# Patient Record
Sex: Male | Born: 1980 | Race: Black or African American | Hispanic: No | Marital: Single | Smoking: Former smoker
Health system: Southern US, Community
[De-identification: ages and names within clinical notes are randomized; demographics above are authoritative.]

## PROBLEM LIST (undated history)

## (undated) DIAGNOSIS — K219 Gastro-esophageal reflux disease without esophagitis: Secondary | ICD-10-CM

## (undated) DIAGNOSIS — D696 Thrombocytopenia, unspecified: Secondary | ICD-10-CM

## (undated) DIAGNOSIS — E46 Unspecified protein-calorie malnutrition: Secondary | ICD-10-CM

## (undated) DIAGNOSIS — Z923 Personal history of irradiation: Secondary | ICD-10-CM

## (undated) DIAGNOSIS — B20 Human immunodeficiency virus [HIV] disease: Secondary | ICD-10-CM

## (undated) DIAGNOSIS — Z8661 Personal history of infections of the central nervous system: Secondary | ICD-10-CM

## (undated) DIAGNOSIS — B451 Cerebral cryptococcosis: Secondary | ICD-10-CM

## (undated) DIAGNOSIS — Z8619 Personal history of other infectious and parasitic diseases: Secondary | ICD-10-CM

## (undated) DIAGNOSIS — F1010 Alcohol abuse, uncomplicated: Secondary | ICD-10-CM

## (undated) HISTORY — DX: Thrombocytopenia, unspecified: D69.6

## (undated) HISTORY — DX: Personal history of infections of the central nervous system: Z86.61

## (undated) HISTORY — DX: Unspecified protein-calorie malnutrition: E46

## (undated) HISTORY — DX: Personal history of other infectious and parasitic diseases: Z86.19

## (undated) HISTORY — DX: Cerebral cryptococcosis: B45.1

## (undated) HISTORY — DX: Human immunodeficiency virus (HIV) disease: B20

## (undated) HISTORY — DX: Alcohol abuse, uncomplicated: F10.10

## (undated) HISTORY — DX: Gastro-esophageal reflux disease without esophagitis: K21.9

---

## 2005-11-13 ENCOUNTER — Emergency Department (HOSPITAL_COMMUNITY): Admission: EM | Admit: 2005-11-13 | Discharge: 2005-11-13 | Payer: Self-pay | Admitting: Emergency Medicine

## 2006-12-28 ENCOUNTER — Emergency Department (HOSPITAL_COMMUNITY): Admission: EM | Admit: 2006-12-28 | Discharge: 2006-12-28 | Payer: Self-pay | Admitting: Emergency Medicine

## 2007-04-26 ENCOUNTER — Emergency Department (HOSPITAL_COMMUNITY): Admission: EM | Admit: 2007-04-26 | Discharge: 2007-04-26 | Payer: Self-pay | Admitting: Emergency Medicine

## 2007-08-08 ENCOUNTER — Emergency Department (HOSPITAL_COMMUNITY): Admission: EM | Admit: 2007-08-08 | Discharge: 2007-08-08 | Payer: Self-pay | Admitting: Emergency Medicine

## 2010-05-09 ENCOUNTER — Emergency Department (HOSPITAL_COMMUNITY)
Admission: EM | Admit: 2010-05-09 | Discharge: 2010-05-09 | Payer: Self-pay | Source: Home / Self Care | Admitting: Emergency Medicine

## 2011-01-19 LAB — I-STAT 8, (EC8 V) (CONVERTED LAB)
Acid-base deficit: 4 — ABNORMAL HIGH
BUN: 9
Bicarbonate: 19.9 — ABNORMAL LOW
Chloride: 107
Glucose, Bld: 116 — ABNORMAL HIGH
HCT: 49
Hemoglobin: 16.7
Operator id: 272551
Potassium: 3.2 — ABNORMAL LOW
Sodium: 142
TCO2: 21
pCO2, Ven: 33 — ABNORMAL LOW
pH, Ven: 7.388 — ABNORMAL HIGH

## 2011-01-19 LAB — CK: Total CK: 151

## 2011-01-19 LAB — POCT I-STAT CREATININE
Creatinine, Ser: 1.3
Operator id: 272551

## 2013-10-16 ENCOUNTER — Emergency Department (HOSPITAL_COMMUNITY)
Admission: EM | Admit: 2013-10-16 | Discharge: 2013-10-16 | Disposition: A | Discharge status: Home / Self Care | Payer: No Typology Code available for payment source | Attending: Emergency Medicine | Admitting: Emergency Medicine

## 2013-10-16 ENCOUNTER — Encounter (HOSPITAL_COMMUNITY): Payer: Self-pay | Admitting: Emergency Medicine

## 2013-10-16 DIAGNOSIS — IMO0002 Reserved for concepts with insufficient information to code with codable children: Secondary | ICD-10-CM | POA: Insufficient documentation

## 2013-10-16 DIAGNOSIS — Y92410 Unspecified street and highway as the place of occurrence of the external cause: Secondary | ICD-10-CM | POA: Insufficient documentation

## 2013-10-16 DIAGNOSIS — S161XXA Strain of muscle, fascia and tendon at neck level, initial encounter: Secondary | ICD-10-CM

## 2013-10-16 DIAGNOSIS — Y9389 Activity, other specified: Secondary | ICD-10-CM | POA: Insufficient documentation

## 2013-10-16 DIAGNOSIS — F17200 Nicotine dependence, unspecified, uncomplicated: Secondary | ICD-10-CM | POA: Insufficient documentation

## 2013-10-16 DIAGNOSIS — S139XXA Sprain of joints and ligaments of unspecified parts of neck, initial encounter: Secondary | ICD-10-CM | POA: Insufficient documentation

## 2013-10-16 DIAGNOSIS — T148XXA Other injury of unspecified body region, initial encounter: Secondary | ICD-10-CM

## 2013-10-16 MED ORDER — CYCLOBENZAPRINE HCL 10 MG PO TABS: 10.0000 mg | ORAL_TABLET | Freq: Two times a day (BID) | ORAL | Status: DC | PRN

## 2013-10-16 MED ORDER — IBUPROFEN 600 MG PO TABS: 600.0000 mg | ORAL_TABLET | Freq: Four times a day (QID) | ORAL | Status: DC | PRN

## 2013-10-16 MED ORDER — IBUPROFEN 800 MG PO TABS
800.0000 mg | ORAL_TABLET | Freq: Once | ORAL | Status: AC
  Administered 2013-10-16: 800 mg via ORAL
  Filled 2013-10-16: qty 1

## 2013-10-16 MED ORDER — TRAMADOL HCL 50 MG PO TABS
50.0000 mg | ORAL_TABLET | Freq: Once | ORAL | Status: AC
  Administered 2013-10-16: 50 mg via ORAL
  Filled 2013-10-16: qty 1
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
## 2016-12-20 ENCOUNTER — Emergency Department (HOSPITAL_COMMUNITY)
Admission: EM | Admit: 2016-12-20 | Discharge: 2016-12-20 | Disposition: A | Discharge status: Home / Self Care | Payer: Self-pay | Attending: Emergency Medicine | Admitting: Emergency Medicine

## 2016-12-20 ENCOUNTER — Encounter (HOSPITAL_COMMUNITY): Payer: Self-pay

## 2016-12-20 DIAGNOSIS — T1592XA Foreign body on external eye, part unspecified, left eye, initial encounter: Secondary | ICD-10-CM

## 2016-12-20 DIAGNOSIS — Y929 Unspecified place or not applicable: Secondary | ICD-10-CM | POA: Insufficient documentation

## 2016-12-20 DIAGNOSIS — Y33XXXA Other specified events, undetermined intent, initial encounter: Secondary | ICD-10-CM | POA: Insufficient documentation

## 2016-12-20 DIAGNOSIS — Y998 Other external cause status: Secondary | ICD-10-CM | POA: Insufficient documentation

## 2016-12-20 DIAGNOSIS — T1502XA Foreign body in cornea, left eye, initial encounter: Secondary | ICD-10-CM | POA: Insufficient documentation

## 2016-12-20 DIAGNOSIS — F17210 Nicotine dependence, cigarettes, uncomplicated: Secondary | ICD-10-CM | POA: Insufficient documentation

## 2016-12-20 DIAGNOSIS — Y939 Activity, unspecified: Secondary | ICD-10-CM | POA: Insufficient documentation

## 2016-12-20 MED ORDER — FLUORESCEIN-BENOXINATE 0.25-0.4 % OP SOLN: 1.0000 [drp] | Freq: Once | OPHTHALMIC | Status: DC

## 2016-12-20 MED ORDER — FLUORESCEIN SODIUM 0.6 MG OP STRP
ORAL_STRIP | OPHTHALMIC | Status: AC
  Filled 2016-12-20: qty 1

## 2016-12-20 MED ORDER — TETRACAINE HCL 0.5 % OP SOLN
2.0000 [drp] | Freq: Once | OPHTHALMIC | Status: AC
  Administered 2016-12-20: 2 [drp] via OPHTHALMIC
  Filled 2016-12-20: qty 4

## 2016-12-20 NOTE — ED Provider Notes (Signed)
[facility] Provider Note   CSN: [id number] Arrival date & time: 12/20/16  [id number]     History   Chief Complaint Chief Complaint  Patient presents with  . Eye Pain    HPI  Christopher Henson is a 36 y.o. tense to the ED complaining of foreign body sensation in the left eye which began last night at midnight after he was shaking out his clothes from work. Patient works at the [employer], and is concerned that he has a small piece of metal in his left eye. Patient describes it as irritation with watering, more so than pain. He reports he has tried to flush out his eye and used eyedrops but has had no relief and feels that something is still in his eye. Patient denies decreased visual acuity, discharge, or pain with eye movements. Patient does not wear contacts.      History reviewed. No pertinent past medical history.  There are no active problems to display for this patient.   History reviewed. No pertinent surgical history.     Home Medications    Prior to Admission medications   Medication Sig Start Date End Date Taking? Authorizing Provider  cyclobenzaprine (FLEXERIL) 10 MG tablet Take 1 tablet (10 mg total) by mouth 2 (two) times daily as needed for muscle spasms. 10/16/13   Brandt LoosenManly, Julie, MD  ibuprofen (ADVIL,MOTRIN) 600 MG tablet Take 1 tablet (600 mg total) by mouth every 6 (six) hours as needed. 10/16/13   Brandt LoosenManly, Julie, MD    Family History No family history on file.  Social History Social History  Substance Use Topics  . Smoking status: Current Every Day Smoker    Packs/day: 1.00    Types: Cigarettes  . Smokeless tobacco: Never Used  . Alcohol use No     Allergies   Patient has no known allergies.   Review of Systems Review of Systems  Constitutional: Negative for chills and fever.  Eyes: Positive for pain and redness. Negative for photophobia, discharge and visual disturbance.     Physical Exam Updated Vital Signs BP 121/83 (BP Location: Left  Arm)   Pulse 95   Temp 98.1 F (36.7 C) (Oral)   Resp 20   Ht 6' (1.829 m)   Wt 72.6 kg (160 lb)   SpO2 99%   BMI 21.70 kg/m   Physical Exam  Constitutional: He appears well-developed and well-nourished. No distress.  HENT:  Head: Normocephalic and atraumatic.  Eyes: Pupils are equal, round, and reactive to light. EOM are normal. Right eye exhibits no discharge. Left eye exhibits no discharge.  Visual acuity 20/20 Left eye, 20/20 right eye, 20/20 both Left sclera is erythematous and actively watering on exam, no purulent discharge, area of conjunctival irritation seen on inner upper lid PERRL, no consensual pain with pupillary constriction, EOMs intact and non-painful   On Slit Lamp Exam with fluorescein two small foreign bodies are visualized, with some surrounding scratch marks on the central cornea. One FB was removed with cotton tip, but second was embedded and unable to be removed. No evidence of microperforation of the globe  Pulmonary/Chest: Effort normal. No respiratory distress.  Neurological: He is alert. Coordination normal.  Skin: He is not diaphoretic.  Psychiatric: He has a normal mood and affect. His behavior is normal.  Nursing note and vitals reviewed.    ED Treatments / Results  Labs (all labs ordered are listed, but only abnormal results are displayed) Labs Reviewed - No data to display  EKG  EKG Interpretation None       Radiology No results found.  Procedures .Foreign Body Removal Date/Time: 12/20/2016 5:35 PM Performed by: Dartha Lodge Authorized by: Dartha Lodge  Consent: Verbal consent obtained. Consent given by: patient Body area: eye Anesthesia: local infiltration  Anesthesia: Local Anesthetic: tetracaine drops Patient cooperative: yes Localization method: slit lamp and visualized Removal mechanism: moist cotton swab Eye examined with fluorescein. Corneal abrasion size: small Corneal abrasion location: central No residual rust  ring present. Depth: superficial (2 foreign bodies present, one was superficial and able to be removed, a second was embedded) Complexity: simple 1 objects recovered. Objects recovered: small piece of metal Post-procedure assessment: foreign body removed (second foreign body retained) Patient tolerance: Patient tolerated the procedure well with no immediate complications   (including critical care time)  Medications Ordered in ED Medications  tetracaine (PONTOCAINE) 0.5 % ophthalmic solution 2 drop (2 drops Left Eye Given 12/20/16 1191)     Initial Impression / Assessment and Plan / ED Course  I have reviewed the triage vital signs and the nursing notes.  Pertinent labs & imaging results that were available during my care of the patient were reviewed by me and considered in my medical decision making (see chart for details).  Patient presents with foreign body in the left eye. No decreased visual acuity. No change in vision, acuity equal bilaterally.  Pt is not a contact lens wearer.  Exam non-concerning for orbital cellulitis, hyphema, corneal ulcers. 2 small metallic foreign bodies present on slit lamp exam, one was removed with cotton-tip applicator, the other was embedded in the cornea and unable to be removed. Opthalmology was contacted and Dr. Randon Goldsmith will see the patient in office today at 1300 for management of retained foreign body.  Final Clinical Impressions(s) / ED Diagnoses   Final diagnoses:  Foreign body of left eye, initial encounter    New Prescriptions Discharge Medication List as of 12/20/2016 10:22 AM       Dartha Lodge, PA-C 12/20/16 1800    Gwyneth Sprout, MD 12/21/16 2102

## 2016-12-20 NOTE — ED Notes (Signed)
Instructed to go to Dr. Randon GoldsmithLyles office at 1300 today.

## 2016-12-20 NOTE — ED Triage Notes (Signed)
Per Pt, Pt is coming from home with complaints of left eye discomfort. Pt reports that he noticed something in his left eye last night, but has not been able to remove it even wth flushing. Irritation and redness noted to the left eye.

## 2016-12-20 NOTE — ED Notes (Signed)
PA speaking with Opthalmology.

## 2017-08-13 ENCOUNTER — Encounter: Payer: Self-pay | Admitting: Internal Medicine

## 2017-10-03 ENCOUNTER — Ambulatory Visit: Payer: Self-pay | Admitting: Internal Medicine

## 2018-01-10 ENCOUNTER — Encounter (HOSPITAL_COMMUNITY): Payer: Self-pay | Admitting: Emergency Medicine

## 2018-01-10 ENCOUNTER — Ambulatory Visit (HOSPITAL_COMMUNITY)
Admission: EM | Admit: 2018-01-10 | Discharge: 2018-01-10 | Disposition: A | Discharge status: Home / Self Care | Payer: BLUE CROSS/BLUE SHIELD | Attending: Family Medicine | Admitting: Family Medicine

## 2018-01-10 DIAGNOSIS — L02415 Cutaneous abscess of right lower limb: Secondary | ICD-10-CM | POA: Diagnosis not present

## 2018-01-10 DIAGNOSIS — L0291 Cutaneous abscess, unspecified: Secondary | ICD-10-CM | POA: Diagnosis not present

## 2018-01-10 MED ORDER — SULFAMETHOXAZOLE-TRIMETHOPRIM 800-160 MG PO TABS: 1.0000 | ORAL_TABLET | Freq: Two times a day (BID) | ORAL | 0 refills | Status: AC

## 2018-01-10 NOTE — ED Triage Notes (Signed)
PT has a abscess to right inner thigh for 5-6 days. Has had one here before several years ago.

## 2018-01-10 NOTE — ED Provider Notes (Signed)
[place]    CSN: [id number] Arrival date & time: 01/10/18  0901     History   Chief Complaint Chief Complaint  Patient presents with  . Abscess    HPI Christopher Henson is a 37 y.o. male.   He is a 37 year old male that presents with abscess to right upper inner thigh that has worsened over the past week.  Denies drainage from the area.  Denies any fever, chills, body aches. He has been using some over-the-counter boil ease medication without relief of symptoms.    Abscess  Location:  Leg Leg abscess location:  R upper leg Abscess quality: fluctuance, induration, painful, redness and warmth   Abscess quality: not draining, no itching and not weeping   Red streaking: no   Duration:  1 week Progression:  Worsening Pain details:    Quality:  Dull, pressure and aching   Severity:  Moderate   Duration:  4 days   Timing:  Intermittent   Progression:  Waxing and waning Chronicity:  New Context: not diabetes, not immunosuppression, not injected drug use, not insect bite/sting and not skin injury   Relieved by:  Nothing Associated symptoms: no anorexia, no fatigue, no fever, no headaches, no nausea and no vomiting   Risk factors: no family hx of MRSA, no hx of MRSA and no prior abscess     Past Medical History:  Diagnosis Date  . Alcohol abuse   . GERD (gastroesophageal reflux disease)   . Thrombocytopenia ([place])     There are no active problems to display for this patient.   History reviewed. No pertinent surgical history.     Home Medications    Prior to Admission medications   Medication Sig Start Date End Date Taking? Authorizing Provider  cyclobenzaprine (FLEXERIL) 10 MG tablet Take 1 tablet (10 mg total) by mouth 2 (two) times daily as needed for muscle spasms. 10/16/13   Elyn Peers, MD  ibuprofen (ADVIL,MOTRIN) 600 MG tablet Take 1 tablet (600 mg total) by mouth every 6 (six) hours as needed. 10/16/13   Elyn Peers, MD  pantoprazole (PROTONIX) 40  MG tablet Take 40 mg by mouth daily.    [provider]  sulfamethoxazole-trimethoprim (BACTRIM DS,SEPTRA DS) 800-160 MG tablet Take 1 tablet by mouth 2 (two) times daily for 7 days. 01/10/18 01/17/18  Loura Halt A, NP  triamcinolone cream (KENALOG) 0.1 % Apply 1 application topically 2 (two) times daily.    [provider]    Family History No family history on file.  Social History Social History   Tobacco Use  . Smoking status: Current Every Day Smoker    Packs/day: 1.00    Types: Cigarettes  . Smokeless tobacco: Never Used  Substance Use Topics  . Alcohol use: No  . Drug use: Yes    Types: Marijuana     Allergies   Patient has no known allergies.   Review of Systems Review of Systems  Constitutional: Negative for activity change, appetite change, chills, fatigue, fever and unexpected weight change.  Gastrointestinal: Negative for anorexia, nausea and vomiting.  Allergic/Immunologic: Negative for immunocompromised state.  Neurological: Negative for headaches.  Hematological: Does not bruise/bleed easily.     Physical Exam Triage Vital Signs ED Triage Vitals  Enc Vitals Group     BP 01/10/18 0915 116/71     Pulse Rate 01/10/18 0915 100     Resp 01/10/18 0915 16     Temp 01/10/18 0915 98.8 F (37.1 C)  Temp Source 01/10/18 0915 Oral     SpO2 01/10/18 0915 100 %     Weight --      Height --      Head Circumference --      Peak Flow --      Pain Score 01/10/18 0924 7     Pain Loc --      Pain Edu? --      Excl. in [place]? --    No data found.  Updated Vital Signs BP 116/71 (BP Location: Left Arm)   Pulse 100   Temp 98.8 F (37.1 C) (Oral)   Resp 16   SpO2 100%   Visual Acuity Right Eye Distance:   Left Eye Distance:   Bilateral Distance:    Right Eye Near:   Left Eye Near:    Bilateral Near:     Physical Exam  Skin: Skin is warm and dry. Capillary refill takes less than 2 seconds.     5 cm by 5 cm abscess with approx 2 cm  fluctuance and surrounding induration and erythema. Tender to touch.   Nursing note and vitals reviewed.    UC Treatments / Results  Labs (all labs ordered are listed, but only abnormal results are displayed) Labs Reviewed - No data to display  EKG None  Radiology No results found.  Procedures Incision and Drainage Date/Time: 01/10/2018 10:29 AM Performed by: Orvan July, NP Authorized by: Orvan July, NP   Consent:    Consent obtained:  Verbal   Consent given by:  Patient   Risks discussed:  Bleeding   Alternatives discussed:  No treatment Location:    Type:  Abscess   Size:  5 cm by 5 cm   Location:  Lower extremity   Lower extremity location:  Leg   Leg location:  R upper leg Pre-procedure details:    Skin preparation:  Betadine Anesthesia (see MAR for exact dosages):    Anesthesia method:  Local infiltration   Local anesthetic:  Lidocaine 2% w/o epi Procedure type:    Complexity:  Complex Procedure details:    Needle aspiration: no     Incision types:  Single straight   Incision depth:  Subcutaneous   Scalpel blade:  11   Wound management:  Probed and deloculated   Drainage:  Bloody and purulent   Drainage amount:  Copious   Packing materials:  1/4 in gauze Post-procedure details:    Patient tolerance of procedure:  Tolerated well, no immediate complications   (including critical care time)  Medications Ordered in UC Medications - No data to display  Initial Impression / Assessment and Plan / UC Course  I have reviewed the triage vital signs and the nursing notes.  Pertinent labs & imaging results that were available during my care of the patient were reviewed by me and considered in my medical decision making (see chart for details).     Abscess I&D. A lot or bloody purulent drainage from abscess Pt felt relief Packed and drained Will return in 2 days for recheck Bactrim for abx coverage   Final Clinical Impressions(s) / UC Diagnoses    Final diagnoses:  Abscess     Discharge Instructions     It was nice meeting you!!  We drained the abscess Bactrim for antibiotic coverage Keep area clean and dry, changing dressings.  Come back in 2 days for wound check and to remove the packing Sooner if worse.  You can take ibuprofen  for pain as needed    ED Prescriptions    Medication Sig Dispense Auth. Provider   sulfamethoxazole-trimethoprim (BACTRIM DS,SEPTRA DS) 800-160 MG tablet Take 1 tablet by mouth 2 (two) times daily for 7 days. 14 tablet Orvan July, NP     Controlled Substance Prescriptions [place] Controlled Substance Registry consulted? no   Orvan July, NP 01/10/18 1033

## 2018-01-10 NOTE — Discharge Instructions (Addendum)
It was nice meeting you!!  We drained the abscess Bactrim for antibiotic coverage Keep area clean and dry, changing dressings.  Come back in 2 days for wound check and to remove the packing Sooner if worse.  You can take ibuprofen for pain as needed

## 2018-01-12 ENCOUNTER — Encounter (HOSPITAL_COMMUNITY): Payer: Self-pay

## 2018-01-12 ENCOUNTER — Ambulatory Visit (HOSPITAL_COMMUNITY)
Admission: EM | Admit: 2018-01-12 | Discharge: 2018-01-12 | Disposition: A | Discharge status: Home / Self Care | Payer: BLUE CROSS/BLUE SHIELD | Attending: Family Medicine | Admitting: Family Medicine

## 2018-01-12 DIAGNOSIS — Z5189 Encounter for other specified aftercare: Secondary | ICD-10-CM

## 2018-01-12 DIAGNOSIS — L02415 Cutaneous abscess of right lower limb: Secondary | ICD-10-CM

## 2018-01-12 NOTE — ED Triage Notes (Signed)
Pt presents to have wound check after having an abscess lanced on his right inner thigh.

## 2018-01-12 NOTE — Discharge Instructions (Signed)
Cont abx

## 2018-01-12 NOTE — ED Provider Notes (Signed)
  [facility]    CSN: [id number] Arrival date & time: 01/12/18  1358  Chief Complaint  Patient presents with  . Wound Check   Patient was here 2 days ago for a right inner thigh abscess that was incised and drained.  Packing was placed.  He was also placed on antibiotics.  He feels much better overall.  He has not remove the dressing since his appointment.  BP 99/72 (BP Location: Right Arm)   Pulse 95   Temp 98.2 F (36.8 C) (Oral)   Resp 20   SpO2 95%  Gen- awake, alert Skin-on the proximal and medial portion of the right thigh, there is an indurated area with a central opening, packing removed without foul odor, no erythema or excessive tenderness to palpation Psych-age appropriate judgment and insight  Final Clinical Impressions(s) / UC Diagnoses   Final diagnoses:  Wound check, abscess   Doing well.  Finish antibiotic.  Follow-up as needed.  Patient voiced understanding and agreement to the plan.   Discharge Instructions     Cont abx.    ED Prescriptions    None     Controlled Substance Prescriptions [place] Controlled Substance Registry consulted? Not Applicable   Sharlene Dory, [place] 01/12/18 1436

## 2018-05-11 DIAGNOSIS — B451 Cerebral cryptococcosis: Secondary | ICD-10-CM

## 2018-05-11 HISTORY — DX: Cerebral cryptococcosis: B45.1

## 2018-06-03 ENCOUNTER — Encounter (HOSPITAL_COMMUNITY): Payer: Self-pay | Admitting: Emergency Medicine

## 2018-06-03 ENCOUNTER — Ambulatory Visit (HOSPITAL_COMMUNITY)
Admission: EM | Admit: 2018-06-03 | Discharge: 2018-06-03 | Disposition: A | Discharge status: Home / Self Care | Payer: BLUE CROSS/BLUE SHIELD

## 2018-06-03 ENCOUNTER — Other Ambulatory Visit: Payer: Self-pay

## 2018-06-03 DIAGNOSIS — R51 Headache: Secondary | ICD-10-CM | POA: Diagnosis not present

## 2018-06-03 DIAGNOSIS — R519 Headache, unspecified: Secondary | ICD-10-CM

## 2018-06-03 MED ORDER — METOCLOPRAMIDE HCL 5 MG/ML IJ SOLN
5.0000 mg | Freq: Once | INTRAMUSCULAR | Status: AC
  Administered 2018-06-03: 5 mg via INTRAMUSCULAR

## 2018-06-03 MED ORDER — KETOROLAC TROMETHAMINE 30 MG/ML IJ SOLN
30.0000 mg | Freq: Once | INTRAMUSCULAR | Status: AC
  Administered 2018-06-03: 30 mg via INTRAMUSCULAR

## 2018-06-03 MED ORDER — DEXAMETHASONE SODIUM PHOSPHATE 10 MG/ML IJ SOLN
10.0000 mg | Freq: Once | INTRAMUSCULAR | Status: AC
Start: 2018-06-03 — End: 2018-06-03
  Administered 2018-06-03: 10 mg via INTRAMUSCULAR

## 2018-06-03 MED ORDER — KETOROLAC TROMETHAMINE 30 MG/ML IJ SOLN
INTRAMUSCULAR | Status: AC
  Filled 2018-06-03: qty 1

## 2018-06-03 MED ORDER — DEXAMETHASONE SODIUM PHOSPHATE 10 MG/ML IJ SOLN
INTRAMUSCULAR | Status: AC
  Filled 2018-06-03: qty 1

## 2018-06-03 MED ORDER — METOCLOPRAMIDE HCL 5 MG/ML IJ SOLN
INTRAMUSCULAR | Status: AC
  Filled 2018-06-03: qty 2

## 2018-06-03 NOTE — Discharge Instructions (Signed)
Toradol, reglan, decadron injection in office today. Keep hydrated, urine should be clear to pale yellow in color. Please monitor for worsening symptoms, worsening headache, blurry vision, nausea/vomiting, dizziness, weakness, passing out, go to the emergency department for further evaluation needed.

## 2018-06-03 NOTE — ED Triage Notes (Signed)
Headache since Thursday, slight cough.  Denies vision changes

## 2018-06-03 NOTE — ED Provider Notes (Signed)
[place]    CSN: [id number] Arrival date & time: 06/03/18  0803     History   Chief Complaint Chief Complaint  Patient presents with  . Headache    HPI Christopher Henson is a 38 y.o. male.   38 year old male comes in for 4-5 day history of right sided headache. States it is to the back of his head, constant, waxes and wanes in intensity. When given examples of characteristics of pain, such as throbbing, stabbing, aching, patient states "all of the above". He states movement makes the pain worse. Denies nausea, vomiting. Denies photophobia, phonophobia. Denies weakness, dizziness, syncope. Denies blurry vision. Denies head injury/loss of consciousness. Also started having cough, nasal congestion. Denies fever, chills, night sweats. Tylenol '1000mg'$  without relief. Current every day smoker.      Past Medical History:  Diagnosis Date  . Alcohol abuse   . GERD (gastroesophageal reflux disease)   . Thrombocytopenia ([place])     There are no active problems to display for this patient.   History reviewed. No pertinent surgical history.     Home Medications    Prior to Admission medications   Medication Sig Start Date End Date Taking? Authorizing Provider  Acetaminophen (TYLENOL EXTRA STRENGTH PO) Take by mouth.   Yes [provider]    Family History Family History  Problem Relation Age of Onset  . Diabetes Mother   . Hypertension Mother   . Diabetes Father   . Hypertension Father     Social History Social History   Tobacco Use  . Smoking status: Current Every Day Smoker    Packs/day: 1.00    Types: Cigarettes  . Smokeless tobacco: Never Used  Substance Use Topics  . Alcohol use: Yes  . Drug use: Not Currently    Types: Marijuana     Allergies   Patient has no known allergies.   Review of Systems Review of Systems  Reason unable to perform ROS: See HPI as above.     Physical Exam Triage Vital Signs ED Triage Vitals  Enc Vitals  Group     BP 06/03/18 0835 116/77     Pulse Rate 06/03/18 0835 (!) 102     Resp 06/03/18 0835 20     Temp 06/03/18 0835 100 F (37.8 C)     Temp Source 06/03/18 0835 Oral     SpO2 06/03/18 0835 99 %     Weight --      Height --      Head Circumference --      Peak Flow --      Pain Score 06/03/18 0832 10     Pain Loc --      Pain Edu? --      Excl. in [place]? --    No data found.  Updated Vital Signs BP 116/77 (BP Location: Left Arm)   Pulse (!) 102   Temp 100 F (37.8 C) (Oral)   Resp 20   SpO2 99%   Physical Exam Constitutional:      General: He is not in acute distress.    Appearance: He is well-developed. He is not ill-appearing, toxic-appearing or diaphoretic.  HENT:     Head: Normocephalic and atraumatic.     Right Ear: Tympanic membrane, ear canal and external ear normal. Tympanic membrane is not erythematous or bulging.     Left Ear: Tympanic membrane, ear canal and external ear normal. Tympanic membrane is not erythematous or bulging.  Nose: Nose normal.     Right Sinus: No maxillary sinus tenderness or frontal sinus tenderness.     Left Sinus: No maxillary sinus tenderness or frontal sinus tenderness.     Mouth/Throat:     Mouth: Mucous membranes are moist.     Pharynx: Oropharynx is clear. Uvula midline.  Eyes:     Extraocular Movements: Extraocular movements intact.     Conjunctiva/sclera: Conjunctivae normal.     Pupils: Pupils are equal, round, and reactive to light.  Neck:     Musculoskeletal: Normal range of motion and neck supple. No neck rigidity.  Cardiovascular:     Rate and Rhythm: Normal rate and regular rhythm.     Heart sounds: Normal heart sounds. No murmur. No friction rub. No gallop.   Pulmonary:     Effort: Pulmonary effort is normal. No respiratory distress.     Breath sounds: Normal breath sounds. No stridor. No decreased breath sounds, wheezing, rhonchi or rales.  Skin:    General: Skin is warm and dry.  Neurological:     General:  No focal deficit present.     Mental Status: He is alert and oriented to person, place, and time.     GCS: GCS eye subscore is 4. GCS verbal subscore is 5. GCS motor subscore is 6.     Cranial Nerves: Cranial nerves are intact.     Sensory: Sensation is intact.     Motor: Motor function is intact.     Coordination: Coordination is intact.     Gait: Gait is intact.  Psychiatric:        Behavior: Behavior normal.        Judgment: Judgment normal.    UC Treatments / Results  Labs (all labs ordered are listed, but only abnormal results are displayed) Labs Reviewed - No data to display  EKG None  Radiology No results found.  Procedures Procedures (including critical care time)  Medications Ordered in UC Medications  ketorolac (TORADOL) 30 MG/ML injection 30 mg (30 mg Intramuscular Given 06/03/18 0928)  metoCLOPramide (REGLAN) injection 5 mg (5 mg Intramuscular Given 06/03/18 0931)  dexamethasone (DECADRON) injection 10 mg (10 mg Intramuscular Given 06/03/18 0932)    Initial Impression / Assessment and Plan / UC Course  I have reviewed the triage vital signs and the nursing notes.  Pertinent labs & imaging results that were available during my care of the patient were reviewed by me and considered in my medical decision making (see chart for details).    No alarming signs on exam. Will provide decadon, toradol, reglan injection for symptoms. Other symptomatic treatment discussed. Push fluids. Return precautions given.  Final Clinical Impressions(s) / UC Diagnoses   Final diagnoses:  Acute intractable headache, unspecified headache type    ED Prescriptions    None        Ok Edwards, PA-C 06/03/18 [id number]

## 2018-06-05 ENCOUNTER — Emergency Department (HOSPITAL_COMMUNITY): Payer: BLUE CROSS/BLUE SHIELD

## 2018-06-05 ENCOUNTER — Encounter (HOSPITAL_COMMUNITY): Payer: Self-pay | Admitting: Emergency Medicine

## 2018-06-05 ENCOUNTER — Emergency Department (HOSPITAL_COMMUNITY)
Admission: EM | Admit: 2018-06-05 | Discharge: 2018-06-05 | Disposition: A | Discharge status: Home / Self Care | Payer: BLUE CROSS/BLUE SHIELD | Attending: Emergency Medicine | Admitting: Emergency Medicine

## 2018-06-05 ENCOUNTER — Other Ambulatory Visit: Payer: Self-pay

## 2018-06-05 DIAGNOSIS — B370 Candidal stomatitis: Secondary | ICD-10-CM | POA: Diagnosis not present

## 2018-06-05 DIAGNOSIS — F17210 Nicotine dependence, cigarettes, uncomplicated: Secondary | ICD-10-CM | POA: Insufficient documentation

## 2018-06-05 DIAGNOSIS — G4489 Other headache syndrome: Secondary | ICD-10-CM | POA: Insufficient documentation

## 2018-06-05 DIAGNOSIS — R51 Headache: Secondary | ICD-10-CM | POA: Diagnosis present

## 2018-06-05 LAB — CBC WITH DIFFERENTIAL/PLATELET
Abs Immature Granulocytes: 0 10*3/uL (ref 0.00–0.07)
Basophils Absolute: 0 10*3/uL (ref 0.0–0.1)
Basophils Relative: 1 %
Eosinophils Absolute: 0 10*3/uL (ref 0.0–0.5)
Eosinophils Relative: 0 %
HCT: 37.3 % — ABNORMAL LOW (ref 39.0–52.0)
Hemoglobin: 12.3 g/dL — ABNORMAL LOW (ref 13.0–17.0)
Lymphocytes Relative: 6 %
Lymphs Abs: 0.3 10*3/uL — ABNORMAL LOW (ref 0.7–4.0)
MCH: 27.6 pg (ref 26.0–34.0)
MCHC: 33 g/dL (ref 30.0–36.0)
MCV: 83.8 fL (ref 80.0–100.0)
Monocytes Absolute: 0.2 10*3/uL (ref 0.1–1.0)
Monocytes Relative: 5 %
Neutro Abs: 4 10*3/uL (ref 1.7–7.7)
Neutrophils Relative %: 88 %
Platelets: 147 10*3/uL — ABNORMAL LOW (ref 150–400)
RBC: 4.45 MIL/uL (ref 4.22–5.81)
RDW: 14.4 % (ref 11.5–15.5)
WBC: 4.5 10*3/uL (ref 4.0–10.5)
nRBC: 0 % (ref 0.0–0.2)
nRBC: 2 /100 WBC — ABNORMAL HIGH

## 2018-06-05 LAB — COMPREHENSIVE METABOLIC PANEL
ALT: 77 U/L — ABNORMAL HIGH (ref 0–44)
AST: 55 U/L — ABNORMAL HIGH (ref 15–41)
Albumin: 3.5 g/dL (ref 3.5–5.0)
Alkaline Phosphatase: 98 U/L (ref 38–126)
Anion gap: 12 (ref 5–15)
BUN: 13 mg/dL (ref 6–20)
CO2: 27 mmol/L (ref 22–32)
Calcium: 8.7 mg/dL — ABNORMAL LOW (ref 8.9–10.3)
Chloride: 101 mmol/L (ref 98–111)
Creatinine, Ser: 0.8 mg/dL (ref 0.61–1.24)
GFR calc Af Amer: >60 mL/min (ref >60)
GFR calc non Af Amer: >60 mL/min (ref >60)
Glucose, Bld: 111 mg/dL — ABNORMAL HIGH (ref 70–99)
Potassium: 3.5 mmol/L (ref 3.5–5.1)
Sodium: 140 mmol/L (ref 135–145)
Total Bilirubin: 0.9 mg/dL (ref 0.3–1.2)
Total Protein: 6.8 g/dL (ref 6.5–8.1)

## 2018-06-05 MED ORDER — METOCLOPRAMIDE HCL 5 MG/ML IJ SOLN
10.0000 mg | Freq: Once | INTRAMUSCULAR | Status: AC
  Administered 2018-06-05: 10 mg via INTRAVENOUS
  Filled 2018-06-05: qty 2

## 2018-06-05 MED ORDER — DIPHENHYDRAMINE HCL 50 MG/ML IJ SOLN
25.0000 mg | Freq: Once | INTRAMUSCULAR | Status: AC
  Administered 2018-06-05: 25 mg via INTRAVENOUS
  Filled 2018-06-05: qty 1

## 2018-06-05 MED ORDER — NYSTATIN 100000 UNIT/ML MT SUSP
5.0000 mL | Freq: Four times a day (QID) | OROMUCOSAL | 0 refills | Status: DC
Start: 2018-06-05 — End: 2019-01-29

## 2018-06-05 MED ORDER — ONDANSETRON HCL 4 MG/2ML IJ SOLN
4.0000 mg | Freq: Once | INTRAMUSCULAR | Status: AC
Start: 2018-06-05 — End: 2018-06-05
  Administered 2018-06-05: 4 mg via INTRAVENOUS
  Filled 2018-06-05: qty 2

## 2018-06-05 MED ORDER — METOCLOPRAMIDE HCL 10 MG PO TABS: 10.0000 mg | ORAL_TABLET | Freq: Three times a day (TID) | ORAL | 0 refills | Status: DC | PRN

## 2018-06-05 MED ORDER — SODIUM CHLORIDE 0.9 % IV BOLUS (SEPSIS)
1000.0000 mL | Freq: Once | INTRAVENOUS | Status: AC
  Administered 2018-06-05: 1000 mL via INTRAVENOUS

## 2018-06-05 MED ORDER — FENTANYL CITRATE (PF) 100 MCG/2ML IJ SOLN
100.0000 ug | Freq: Once | INTRAMUSCULAR | Status: AC
  Administered 2018-06-05: 100 ug via INTRAVENOUS
  Filled 2018-06-05: qty 2

## 2018-06-05 NOTE — Discharge Instructions (Addendum)
Please be sure to follow-up on all your lab testing and have a close follow-up as an outpatient.  RETURN IMMEDIATELY IF you develop a sudden, severe headache or confusion, become poorly responsive or faint, develop a fever above 100.19F or problem breathing, have a change in speech, vision, swallowing, or understanding, or develop new weakness, numbness, tingling, incoordination, or have a seizure.

## 2018-06-05 NOTE — ED Notes (Signed)
Pt verbalized understanding of discharge paperwork, prescriptions and follow-up care. Pt stated he would call phone numbers provided to set up PCP.

## 2018-06-05 NOTE — ED Triage Notes (Signed)
Pt reports a headache for 4 days. Pt reports going to UC and getting treated however the headache has not gone away.

## 2018-06-05 NOTE — ED Provider Notes (Signed)
[facility] EMERGENCY DEPARTMENT Provider Note   CSN: [id number] Arrival date & time: 06/05/18  [id number]    History   Chief Complaint Chief Complaint  Patient presents with  . Headache    HPI Brandonmichael Ghent is a 38 y.o. male.     The history is provided by the patient.  Headache  Pain location:  Occipital Quality:  Dull Onset quality:  Gradual Duration:  6 days Timing:  Constant Progression:  Worsening Chronicity:  New Relieved by:  Nothing Worsened by:  Nothing Associated symptoms: fatigue and sore throat   Associated symptoms: no abdominal pain, no fever, no focal weakness, no syncope, no visual change, no vomiting and no weakness   Patient reports for the past 6 days has had gradually worsening occipital headache. No focal weakness.  He was seen in urgent care, given medications but no improvement. He does not typically get headaches. Denies any head trauma. He reports drinking about a pint of liquor a week. He also reports he has had sore throat and "white stuff "in the back of his throat for the past several weeks.  He has never had this before He also endorses night sweats and weight loss recently.   PMH-none Soc hx - denies IVDA, reports ETOH use Past Medical History:  Diagnosis Date  . Alcohol abuse   . GERD (gastroesophageal reflux disease)   . Thrombocytopenia ([place])     There are no active problems to display for this patient.   History reviewed. No pertinent surgical history.      Home Medications    Prior to Admission medications   Medication Sig Start Date End Date Taking? Authorizing Provider  Acetaminophen (TYLENOL EXTRA STRENGTH PO) Take by mouth.    [provider]    Family History Family History  Problem Relation Age of Onset  . Diabetes Mother   . Hypertension Mother   . Diabetes Father   . Hypertension Father     Social History Social History   Tobacco Use  . Smoking status: Current Every Day  Smoker    Packs/day: 1.00    Types: Cigarettes  . Smokeless tobacco: Never Used  Substance Use Topics  . Alcohol use: Yes  . Drug use: Not Currently    Types: Marijuana     Allergies   Patient has no known allergies.   Review of Systems Review of Systems  Constitutional: Positive for appetite change, diaphoresis, fatigue and unexpected weight change. Negative for fever.  HENT: Positive for sore throat.   Cardiovascular: Negative for syncope.  Gastrointestinal: Negative for abdominal pain and vomiting.  Neurological: Positive for headaches. Negative for focal weakness and weakness.  All other systems reviewed and are negative.    Physical Exam Updated Vital Signs BP 133/84 (BP Location: Right Arm)   Pulse (!) 104   Temp 98.7 F (37.1 C)   Resp 18   Ht 1.829 m (6')   Wt 72.6 kg   SpO2 98%   BMI 21.70 kg/m   Physical Exam CONSTITUTIONAL: Mildly disheveled, cachectic HEAD: Normocephalic/atraumatic EYES: EOMI/PERRL, no ptosis ENMT: Thrush noted to oropharynx, erythema noted NECK: supple no meningeal signs SPINE/BACK:entire spine nontender CV: S1/S2 noted, no murmurs/rubs/gallops noted LUNGS: Lungs are clear to auscultation bilaterally, no apparent distress ABDOMEN: soft, nontender, no rebound or guarding GU:no cva tenderness NEURO:Awake/alert, face symmetric, no arm or leg drift is noted Equal 5/5 strength with shoulder abduction, elbow flex/extension, wrist flex/extension in upper extremities and equal hand grips bilaterally  Equal 5/5 strength with hip flexion,knee flex/extension, foot dorsi/plantar flexion Cranial nerves 3/4/5/6/10/16/08/11/12 tested and intact No past pointing Sensation to light touch intact in all extremities EXTREMITIES: pulses normal, full ROM SKIN: warm, color normal PSYCH: no abnormalities of mood noted, alert and oriented to situation    ED Treatments / Results  Labs (all labs ordered are listed, but only abnormal results are  displayed) Labs Reviewed  CBC WITH DIFFERENTIAL/PLATELET - Abnormal; Notable for the following components:      Result Value   Hemoglobin 12.3 (*)    HCT 37.3 (*)    Platelets 147 (*)    Lymphs Abs 0.3 (*)    nRBC 2 (*)    All other components within normal limits  COMPREHENSIVE METABOLIC PANEL - Abnormal; Notable for the following components:   Glucose, Bld 111 (*)    Calcium 8.7 (*)    AST 55 (*)    ALT 77 (*)    All other components within normal limits  HIV ANTIBODY (ROUTINE TESTING W REFLEX)    EKG None  Radiology Ct Head Wo Contrast  Result Date: 06/05/2018 CLINICAL DATA:  38 year old male with headache. EXAM: CT HEAD WITHOUT CONTRAST TECHNIQUE: Contiguous axial images were obtained from the base of the skull through the vertex without intravenous contrast. COMPARISON:  None. FINDINGS: Brain: The ventricles and sulci appropriate size for patient's age. The gray-white matter discrimination is preserved. There is no acute intracranial hemorrhage. No mass effect or midline shift. No extra-axial fluid collection. Vascular: No hyperdense vessel or unexpected calcification. Skull: Normal. Negative for fracture or focal lesion. Sinuses/Orbits: No acute finding. Other: None IMPRESSION: No acute intracranial pathology. Electronically Signed   By: Anner Crete M.D.   On: 06/05/2018 06:09    Procedures Procedures  Medications Ordered in ED Medications  sodium chloride 0.9 % bolus 1,000 mL (0 mLs Intravenous Stopped 06/05/18 0720)  fentaNYL (SUBLIMAZE) injection 100 mcg (100 mcg Intravenous Given 06/05/18 0624)  ondansetron (ZOFRAN) injection 4 mg (4 mg Intravenous Given 06/05/18 0623)  metoCLOPramide (REGLAN) injection 10 mg (10 mg Intravenous Given 06/05/18 0651)  diphenhydrAMINE (BENADRYL) injection 25 mg (25 mg Intravenous Given 06/05/18 [id number])     Initial Impression / Assessment and Plan / ED Course  I have reviewed the triage vital signs and the nursing notes.  Pertinent labs  & imaging results that were available during my care of the patient were reviewed by me and considered in my medical decision making (see chart for details).        5:38 AM Patient presents for ongoing headache for 6 days. He has a normal mental status and no focal weakness.  He is afebrile. However patient is noted to have thrush, and he reports he had this for several weeks.  He also endorses weight loss and night sweats. I am concerned for HIV.  I expressed this to the patient, and we agreed to move forward with testing. We will also perform screening CT head for now due to persistent headache. 7:27 AM Patient reports his headache is improved.  He remains afebrile with a temperature of 98.5 orally I'm still very concerned this represents HIV/AIDS.  He has thrush in conjunction with a depressed lymphocyte count in addition to unintentional 20 pound weight loss and night sweats. Also concerned that his headache may represent an infectious etiology.  I advised to further evaluate his headache, lumbar puncture would be advised.  after discussing this procedure, patient reports he would like to be discharged  and does not want to have a lumbar puncture.  I advised him that I cannot fully rule out an infectious cause with headache. He has asked medications for thrush and has headache.  These medicines were sent to his pharmacy.  I have strongly urged him to follow-up as an outpatient.  He has been given information for [facility] as well as the [facility].  I made sure that his phone number was updated in the system for further callbacks if needed.  We discussed he should return for any worsening HA Final Clinical Impressions(s) / ED Diagnoses   Final diagnoses:  Other headache syndrome  Thrush, oral    ED Discharge Orders         Ordered    nystatin (MYCOSTATIN) 100000 UNIT/ML suspension  4 times daily     06/05/18 0719    metoCLOPramide (REGLAN) 10  MG tablet  Every 8 hours PRN     06/05/18 0719           Ripley Fraise, MD 06/05/18 [phone]

## 2018-06-06 LAB — HIV 1/2 AB DIFFERENTIATION
HIV 1 Ab: POSITIVE — AB
HIV 2 Ab: NEGATIVE

## 2018-06-06 LAB — HIV ANTIBODY (ROUTINE TESTING W REFLEX): HIV Screen 4th Generation wRfx: REACTIVE — AB

## 2018-06-07 ENCOUNTER — Other Ambulatory Visit: Payer: Self-pay

## 2018-06-07 ENCOUNTER — Encounter (HOSPITAL_COMMUNITY): Payer: Self-pay | Admitting: *Deleted

## 2018-06-07 ENCOUNTER — Inpatient Hospital Stay (HOSPITAL_COMMUNITY)
Admission: EM | Admit: 2018-06-07 | Discharge: 2018-06-21 | DRG: 974 | Disposition: A | Discharge status: Home / Self Care | Payer: BLUE CROSS/BLUE SHIELD | Attending: Internal Medicine | Admitting: Internal Medicine

## 2018-06-07 DIAGNOSIS — B451 Cerebral cryptococcosis: Secondary | ICD-10-CM | POA: Diagnosis present

## 2018-06-07 DIAGNOSIS — R519 Headache, unspecified: Secondary | ICD-10-CM | POA: Diagnosis present

## 2018-06-07 DIAGNOSIS — Z8661 Personal history of infections of the central nervous system: Secondary | ICD-10-CM

## 2018-06-07 DIAGNOSIS — Z833 Family history of diabetes mellitus: Secondary | ICD-10-CM

## 2018-06-07 DIAGNOSIS — B20 Human immunodeficiency virus [HIV] disease: Secondary | ICD-10-CM

## 2018-06-07 DIAGNOSIS — E876 Hypokalemia: Secondary | ICD-10-CM | POA: Diagnosis present

## 2018-06-07 DIAGNOSIS — R112 Nausea with vomiting, unspecified: Secondary | ICD-10-CM

## 2018-06-07 DIAGNOSIS — Z8249 Family history of ischemic heart disease and other diseases of the circulatory system: Secondary | ICD-10-CM

## 2018-06-07 DIAGNOSIS — R51 Headache: Secondary | ICD-10-CM

## 2018-06-07 DIAGNOSIS — F17210 Nicotine dependence, cigarettes, uncomplicated: Secondary | ICD-10-CM | POA: Diagnosis present

## 2018-06-07 DIAGNOSIS — Z8619 Personal history of other infectious and parasitic diseases: Secondary | ICD-10-CM

## 2018-06-07 DIAGNOSIS — Z9981 Dependence on supplemental oxygen: Secondary | ICD-10-CM

## 2018-06-07 DIAGNOSIS — A419 Sepsis, unspecified organism: Secondary | ICD-10-CM | POA: Diagnosis present

## 2018-06-07 DIAGNOSIS — G039 Meningitis, unspecified: Secondary | ICD-10-CM

## 2018-06-07 DIAGNOSIS — R140 Abdominal distension (gaseous): Secondary | ICD-10-CM

## 2018-06-07 DIAGNOSIS — B370 Candidal stomatitis: Secondary | ICD-10-CM | POA: Diagnosis present

## 2018-06-07 DIAGNOSIS — K219 Gastro-esophageal reflux disease without esophagitis: Secondary | ICD-10-CM | POA: Diagnosis present

## 2018-06-07 DIAGNOSIS — E43 Unspecified severe protein-calorie malnutrition: Secondary | ICD-10-CM | POA: Diagnosis present

## 2018-06-07 DIAGNOSIS — R509 Fever, unspecified: Secondary | ICD-10-CM

## 2018-06-07 DIAGNOSIS — G4489 Other headache syndrome: Secondary | ICD-10-CM

## 2018-06-07 DIAGNOSIS — B59 Pneumocystosis: Secondary | ICD-10-CM | POA: Diagnosis present

## 2018-06-07 LAB — URINALYSIS, ROUTINE W REFLEX MICROSCOPIC
Bilirubin Urine: NEGATIVE
Glucose, UA: NEGATIVE mg/dL
Hgb urine dipstick: NEGATIVE
Ketones, ur: NEGATIVE mg/dL
Leukocytes,Ua: NEGATIVE
Nitrite: NEGATIVE
Protein, ur: 100 mg/dL — AB
Specific Gravity, Urine: 1.031 — ABNORMAL HIGH (ref 1.005–1.030)
pH: 6 (ref 5.0–8.0)

## 2018-06-07 LAB — CBC
HCT: 38.7 % — ABNORMAL LOW (ref 39.0–52.0)
Hemoglobin: 12.7 g/dL — ABNORMAL LOW (ref 13.0–17.0)
MCH: 28 pg (ref 26.0–34.0)
MCHC: 32.8 g/dL (ref 30.0–36.0)
MCV: 85.4 fL (ref 80.0–100.0)
Platelets: 177 10*3/uL (ref 150–400)
RBC: 4.53 MIL/uL (ref 4.22–5.81)
RDW: 14.6 % (ref 11.5–15.5)
WBC: 6.6 10*3/uL (ref 4.0–10.5)
nRBC: 0.3 % — ABNORMAL HIGH (ref 0.0–0.2)

## 2018-06-07 MED ORDER — SODIUM CHLORIDE 0.9% FLUSH
3.0000 mL | Freq: Once | INTRAVENOUS | Status: AC
  Administered 2018-06-07: 3 mL via INTRAVENOUS

## 2018-06-07 MED ORDER — ALUM & MAG HYDROXIDE-SIMETH 200-200-20 MG/5ML PO SUSP
30.0000 mL | Freq: Once | ORAL | Status: AC
  Administered 2018-06-07: 30 mL via ORAL
  Filled 2018-06-07: qty 30

## 2018-06-07 MED ORDER — ONDANSETRON HCL 4 MG/2ML IJ SOLN
4.0000 mg | Freq: Once | INTRAMUSCULAR | Status: AC
  Administered 2018-06-07: 4 mg via INTRAVENOUS
  Filled 2018-06-07: qty 2

## 2018-06-07 MED ORDER — SODIUM CHLORIDE 0.9 % IV BOLUS
1000.0000 mL | Freq: Once | INTRAVENOUS | Status: AC
  Administered 2018-06-07: 1000 mL via INTRAVENOUS

## 2018-06-07 MED ORDER — LIDOCAINE VISCOUS HCL 2 % MT SOLN
15.0000 mL | Freq: Once | OROMUCOSAL | Status: AC
  Administered 2018-06-07: 15 mL via ORAL
  Filled 2018-06-07: qty 15

## 2018-06-07 NOTE — ED Provider Notes (Signed)
[facility] Provider Note  CSN: [id number] Arrival date & time: 06/07/18 2256  Chief Complaint(s) Emesis  HPI Christopher Henson is a 38 y.o. male with a history listed below who presents to the emergency department with 1 week of persistent headache with associated nausea, nonbloody nonbilious emesis, inability to tolerate oral intake, and dizziness upon standing.  Patient was seen for similar presentation on February 26.  During that work-up, the provider was concerned for possible HIV/AIDS.  CT scan was negative.  Patient was treated symptomatically and headache improved.  At that time he declined lumbar puncture.  HIV antibody was obtained which has resulted positive.  Noted to have thrush.   HPI  Past Medical History Past Medical History:  Diagnosis Date  . Alcohol abuse   . GERD (gastroesophageal reflux disease)   . Thrombocytopenia [facility])    Patient Active Problem List   Diagnosis Date Noted  . Headache 06/08/2018   Home Medication(s) Prior to Admission medications   Medication Sig Start Date End Date Taking? Authorizing Provider  acetaminophen (TYLENOL) 500 MG tablet Take 500-1,000 mg by mouth 3 (three) times daily as needed (pain).    Yes [provider]  naproxen sodium (ALEVE) 220 MG tablet Take 220 mg by mouth.   Yes [provider]  nystatin (MYCOSTATIN) 100000 UNIT/ML suspension Take 5 mLs (500,000 Units total) by mouth 4 (four) times daily. Swish and spit this medication 06/05/18  Yes Zadie Rhine, MD  metoCLOPramide (REGLAN) 10 MG tablet Take 1 tablet (10 mg total) by mouth every 8 (eight) hours as needed for nausea (nausea/headache). Patient not taking: Reported on 06/08/2018 06/05/18   Zadie Rhine, MD                                                                                                                                    Past Surgical History History reviewed. No pertinent surgical history. Family  History Family History  Problem Relation Age of Onset  . Diabetes Mother   . Hypertension Mother   . Diabetes Father   . Hypertension Father     Social History Social History   Tobacco Use  . Smoking status: Current Every Day Smoker    Packs/day: 1.00    Types: Cigarettes  . Smokeless tobacco: Never Used  Substance Use Topics  . Alcohol use: Yes  . Drug use: Not Currently    Types: Marijuana   Allergies Patient has no known allergies.  Review of Systems Review of Systems All other systems are reviewed and are negative for acute change except as noted in the HPI  Physical Exam Vital Signs  I have reviewed the triage vital signs BP (!) 130/93   Pulse 75   Temp 98 F (36.7 C)   Resp 18   Ht 6' (1.829 m)   Wt 72.6 kg   SpO2 97%   BMI 21.70 kg/m   Physical  Exam Vitals signs reviewed.  Constitutional:      General: He is not in acute distress.    Appearance: He is well-developed. He is not diaphoretic.  HENT:     Head: Normocephalic and atraumatic.     Jaw: No trismus.     Right Ear: External ear normal.     Left Ear: External ear normal.     Nose: Nose normal.     Mouth/Throat:     Comments: thrush Eyes:     General: No scleral icterus.    Conjunctiva/sclera: Conjunctivae normal.  Neck:     Musculoskeletal: Normal range of motion.     Trachea: Phonation normal.  Cardiovascular:     Rate and Rhythm: Normal rate and regular rhythm.  Pulmonary:     Effort: Pulmonary effort is normal. No respiratory distress.     Breath sounds: No stridor.  Abdominal:     General: There is no distension.     Tenderness: There is no abdominal tenderness.  Musculoskeletal: Normal range of motion.  Neurological:     Mental Status: He is alert and oriented to person, place, and time.  Psychiatric:        Behavior: Behavior normal.     ED Results and Treatments Labs (all labs ordered are listed, but only abnormal results are displayed) Labs Reviewed  COMPREHENSIVE  METABOLIC PANEL - Abnormal; Notable for the following components:      Result Value   Potassium 3.3 (*)    Glucose, Bld 119 (*)    AST 43 (*)    ALT 60 (*)    All other components within normal limits  CBC - Abnormal; Notable for the following components:   Hemoglobin 12.7 (*)    HCT 38.7 (*)    nRBC 0.3 (*)    All other components within normal limits  URINALYSIS, ROUTINE W REFLEX MICROSCOPIC - Abnormal; Notable for the following components:   Color, Urine AMBER (*)    APPearance HAZY (*)    Specific Gravity, Urine 1.031 (*)    Protein, ur 100 (*)    Bacteria, UA RARE (*)    All other components within normal limits  CSF CULTURE  GRAM STAIN  HSV CULTURE AND TYPING  LIPASE, BLOOD  GLUCOSE, CSF  PROTEIN, CSF  CRYPTOCOCCAL ANTIGEN  T-HELPER CELLS (CD4) COUNT (NOT AT [facility])  CSF CELL COUNT WITH DIFFERENTIAL  CSF CELL COUNT WITH DIFFERENTIAL  CRYPTOCOCCAL ANTIGEN, CSF  HERPES SIMPLEX VIRUS(HSV) DNA BY PCR  VDRL, CSF  ARBOVIRUS IGG, CSF                                                                                                                         EKG  EKG Interpretation  Date/Time:    Ventricular Rate:    PR Interval:    QRS Duration:   QT Interval:    QTC Calculation:   R Axis:     Text Interpretation:  Radiology No results found. Pertinent labs & imaging results that were available during my care of the patient were reviewed by me and considered in my medical decision making (see chart for details).  Medications Ordered in ED Medications  sodium chloride flush (NS) 0.9 % injection 3 mL (3 mLs Intravenous Given 06/07/18 2343)  sodium chloride 0.9 % bolus 1,000 mL (0 mLs Intravenous Stopped 06/08/18 0046)  ondansetron (ZOFRAN) injection 4 mg (4 mg Intravenous Given 06/07/18 2356)  alum & mag hydroxide-simeth (MAALOX/MYLANTA) 200-200-20 MG/5ML suspension 30 mL (30 mLs Oral Given 06/07/18 2356)    And  lidocaine (XYLOCAINE) 2 % viscous mouth solution 15 mL  (15 mLs Oral Given 06/07/18 2356)  acetaminophen (TYLENOL) tablet 1,000 mg (1,000 mg Oral Given 06/08/18 0045)  HYDROmorphone (DILAUDID) injection 1 mg (1 mg Intravenous Given 06/08/18 0206)  lidocaine-EPINEPHrine (XYLOCAINE W/EPI) 2 %-1:200000 (PF) injection 10 mL (10 mLs Intradermal Given by Other 06/08/18 0206)                                                                                                                                    Procedures .Lumbar Puncture Date/Time: 06/08/2018 3:14 AM Performed by: Nira Conn, MD Authorized by: Nira Conn, MD   Consent:    Consent obtained:  Verbal   Consent given by:  Patient   Risks discussed:  Bleeding, infection, headache, nerve damage and pain   Alternatives discussed:  Delayed treatment Pre-procedure details:    Procedure purpose:  Diagnostic   Preparation: Patient was prepped and draped in usual sterile fashion   Anesthesia (see MAR for exact dosages):    Anesthesia method:  Local infiltration   Local anesthetic:  Lidocaine 1% w/o epi Procedure details:    Lumbar space:  L3-L4 interspace   Patient position:  L lateral decubitus   Needle gauge:  18   Needle type:  Spinal needle - Quincke tip   Needle length (in):  3.5   Ultrasound guidance: no     Number of attempts:  1   Opening pressure (cm H2O):  35   Fluid appearance:  Clear   Tubes of fluid:  4   Total volume (ml):  12 Post-procedure:    Puncture site:  Adhesive bandage applied   Patient tolerance of procedure:  Tolerated well, no immediate complications    (including critical care time)  Medical Decision Making / ED Course I have reviewed the nursing notes for this encounter and the patient's prior records (if available in EHR or on provided paperwork).    Patient presents with several days of headache with associated nausea and nonbloody nonbilious emesis.  Patient also endorsing orthostasis.  Was recently seen in the emergency department  for flulike symptoms.  Tested for HIV which has now returned positive.  Given the patient's symptoms, there is a concern for possible cryptococcal meningitis.  Patient is ill-appearing but afebrile and otherwise well hydrated  and nontoxic.  He does not have any nuchal rigidity.  CT head on February 26 was unremarkable.  Discussed LP which patient was initially reluctant to.  Sent serum crypto coccal antigen.  When informed that the lab is a send out, I discussed this with the patient who was amicable to performing the LP.  Labs are reassuring without leukocytosis, thrombocytopenia.  No significant electrolyte derangements or renal sufficiency.  Patient with mild transaminitis but not consistent with biliary obstruction or pancreatitis.  Abdomen is benign.  Low suspicion for serious intra-abdominal Fama to assess infectious process.  Provided with IV fluids, pain medicine and nausea medicine.  Currently awaiting CSF studies.  Opening pressure at 35.  Protein and glucose within normal limits.  Still pending the rest of the CSF studies.  Patient's headache has improved.  He continues to be nontoxic.  Will admit the patient for continued work-up and management.   Final Clinical Impression(s) / ED Diagnoses Final diagnoses:  Symptomatic HIV infection ([facility])  Thrush  Other headache syndrome  Nausea and vomiting in adult      This chart was dictated using voice recognition software.  Despite best efforts to proofread,  errors can occur which can change the documentation meaning.   Nira Conn, MD 06/08/18 [phone]

## 2018-06-07 NOTE — ED Triage Notes (Signed)
Pt c/o vomiting x 5 days along with h/a.  Pt seen @ [facility] this week.

## 2018-06-07 NOTE — ED Notes (Signed)
Urine culture sent to the lab. 

## 2018-06-08 ENCOUNTER — Other Ambulatory Visit: Payer: Self-pay

## 2018-06-08 DIAGNOSIS — Z9981 Dependence on supplemental oxygen: Secondary | ICD-10-CM | POA: Diagnosis not present

## 2018-06-08 DIAGNOSIS — R51 Headache: Secondary | ICD-10-CM | POA: Diagnosis not present

## 2018-06-08 DIAGNOSIS — K219 Gastro-esophageal reflux disease without esophagitis: Secondary | ICD-10-CM | POA: Diagnosis present

## 2018-06-08 DIAGNOSIS — R0602 Shortness of breath: Secondary | ICD-10-CM | POA: Diagnosis not present

## 2018-06-08 DIAGNOSIS — F17210 Nicotine dependence, cigarettes, uncomplicated: Secondary | ICD-10-CM | POA: Diagnosis present

## 2018-06-08 DIAGNOSIS — B59 Pneumocystosis: Secondary | ICD-10-CM | POA: Diagnosis present

## 2018-06-08 DIAGNOSIS — R519 Headache, unspecified: Secondary | ICD-10-CM | POA: Diagnosis present

## 2018-06-08 DIAGNOSIS — Z8249 Family history of ischemic heart disease and other diseases of the circulatory system: Secondary | ICD-10-CM | POA: Diagnosis not present

## 2018-06-08 DIAGNOSIS — R112 Nausea with vomiting, unspecified: Secondary | ICD-10-CM | POA: Diagnosis not present

## 2018-06-08 DIAGNOSIS — B451 Cerebral cryptococcosis: Secondary | ICD-10-CM | POA: Diagnosis present

## 2018-06-08 DIAGNOSIS — E43 Unspecified severe protein-calorie malnutrition: Secondary | ICD-10-CM | POA: Diagnosis present

## 2018-06-08 DIAGNOSIS — Z21 Asymptomatic human immunodeficiency virus [HIV] infection status: Secondary | ICD-10-CM | POA: Diagnosis not present

## 2018-06-08 DIAGNOSIS — B379 Candidiasis, unspecified: Secondary | ICD-10-CM | POA: Diagnosis not present

## 2018-06-08 DIAGNOSIS — A419 Sepsis, unspecified organism: Secondary | ICD-10-CM | POA: Diagnosis present

## 2018-06-08 DIAGNOSIS — G4489 Other headache syndrome: Secondary | ICD-10-CM | POA: Diagnosis not present

## 2018-06-08 DIAGNOSIS — B370 Candidal stomatitis: Secondary | ICD-10-CM | POA: Diagnosis present

## 2018-06-08 DIAGNOSIS — E876 Hypokalemia: Secondary | ICD-10-CM | POA: Diagnosis present

## 2018-06-08 DIAGNOSIS — Z833 Family history of diabetes mellitus: Secondary | ICD-10-CM | POA: Diagnosis not present

## 2018-06-08 DIAGNOSIS — B20 Human immunodeficiency virus [HIV] disease: Secondary | ICD-10-CM | POA: Diagnosis present

## 2018-06-08 LAB — BASIC METABOLIC PANEL
Anion gap: 8 (ref 5–15)
BUN: 15 mg/dL (ref 6–20)
CO2: 29 mmol/L (ref 22–32)
Calcium: 8.6 mg/dL — ABNORMAL LOW (ref 8.9–10.3)
Chloride: 107 mmol/L (ref 98–111)
Creatinine, Ser: 0.91 mg/dL (ref 0.61–1.24)
GFR calc Af Amer: >60 mL/min (ref >60)
GFR calc non Af Amer: >60 mL/min (ref >60)
Glucose, Bld: 135 mg/dL — ABNORMAL HIGH (ref 70–99)
Potassium: 3.4 mmol/L — ABNORMAL LOW (ref 3.5–5.1)
Sodium: 144 mmol/L (ref 135–145)

## 2018-06-08 LAB — CSF CELL COUNT WITH DIFFERENTIAL
RBC Count, CSF: 0 /mm3
RBC Count, CSF: 0 /mm3
Tube #: 1
Tube #: 4
WBC, CSF: 0 /mm3 (ref 0–5)
WBC, CSF: 0 /mm3 (ref 0–5)

## 2018-06-08 LAB — COMPREHENSIVE METABOLIC PANEL
ALT: 60 U/L — ABNORMAL HIGH (ref 0–44)
AST: 43 U/L — ABNORMAL HIGH (ref 15–41)
Albumin: 3.6 g/dL (ref 3.5–5.0)
Alkaline Phosphatase: 105 U/L (ref 38–126)
Anion gap: 8 (ref 5–15)
BUN: 18 mg/dL (ref 6–20)
CO2: 28 mmol/L (ref 22–32)
Calcium: 8.9 mg/dL (ref 8.9–10.3)
Chloride: 105 mmol/L (ref 98–111)
Creatinine, Ser: 0.9 mg/dL (ref 0.61–1.24)
GFR calc Af Amer: >60 mL/min (ref >60)
GFR calc non Af Amer: >60 mL/min (ref >60)
Glucose, Bld: 119 mg/dL — ABNORMAL HIGH (ref 70–99)
Potassium: 3.3 mmol/L — ABNORMAL LOW (ref 3.5–5.1)
Sodium: 141 mmol/L (ref 135–145)
Total Bilirubin: 0.8 mg/dL (ref 0.3–1.2)
Total Protein: 7 g/dL (ref 6.5–8.1)

## 2018-06-08 LAB — CRYPTOCOCCAL ANTIGEN
Crypto Ag: POSITIVE — AB
Cryptococcal Ag Titer: >2560 — AB

## 2018-06-08 LAB — PROTEIN, CSF: Total  Protein, CSF: 35 mg/dL (ref 15–45)

## 2018-06-08 LAB — CRYPTOCOCCAL ANTIGEN, CSF
Crypto Ag: POSITIVE — AB
Cryptococcal Ag Titer: >2560 — AB

## 2018-06-08 LAB — GLUCOSE, CSF: Glucose, CSF: 50 mg/dL (ref 40–70)

## 2018-06-08 LAB — MAGNESIUM: Magnesium: 2 mg/dL (ref 1.7–2.4)

## 2018-06-08 LAB — LIPASE, BLOOD: Lipase: 36 U/L (ref 11–51)

## 2018-06-08 MED ORDER — ONDANSETRON HCL 4 MG PO TABS: 4.0000 mg | ORAL_TABLET | Freq: Four times a day (QID) | ORAL | Status: DC | PRN

## 2018-06-08 MED ORDER — MEPERIDINE HCL 25 MG/ML IJ SOLN
25.0000 mg | INTRAMUSCULAR | Status: DC | PRN
  Filled 2018-06-08: qty 1

## 2018-06-08 MED ORDER — ACETAMINOPHEN 500 MG PO TABS
1000.0000 mg | ORAL_TABLET | Freq: Once | ORAL | Status: AC
  Administered 2018-06-08: 1000 mg via ORAL
  Filled 2018-06-08: qty 2

## 2018-06-08 MED ORDER — SODIUM CHLORIDE 0.9 % IV BOLUS FOR AMBISOME
500.0000 mL | Freq: Every day | INTRAVENOUS | Status: DC
  Administered 2018-06-08 – 2018-06-21 (×14): 500 mL via INTRAVENOUS

## 2018-06-08 MED ORDER — DIPHENHYDRAMINE HCL 25 MG PO CAPS: 25.0000 mg | ORAL_CAPSULE | Freq: Every day | ORAL | Status: DC | PRN

## 2018-06-08 MED ORDER — LIDOCAINE-EPINEPHRINE (PF) 2 %-1:200000 IJ SOLN
10.0000 mL | Freq: Once | INTRAMUSCULAR | Status: AC
  Administered 2018-06-08: 10 mL via INTRADERMAL
  Filled 2018-06-08: qty 20

## 2018-06-08 MED ORDER — DEXTROSE 5% FOR FLUSHING BEFORE AND AFTER AMBISOME
10.0000 mL | Freq: Every day | INTRAVENOUS | Status: DC
  Administered 2018-06-08 – 2018-06-11 (×4): 10 mL via INTRAVENOUS
  Filled 2018-06-08 (×9): qty 50

## 2018-06-08 MED ORDER — FLUCONAZOLE IN SODIUM CHLORIDE 200-0.9 MG/100ML-% IV SOLN: 200.0000 mg | Freq: Every day | INTRAVENOUS | Status: DC

## 2018-06-08 MED ORDER — INFLUENZA VAC SPLIT QUAD 0.5 ML IM SUSY
0.5000 mL | PREFILLED_SYRINGE | INTRAMUSCULAR | Status: DC
  Filled 2018-06-08 (×2): qty 0.5

## 2018-06-08 MED ORDER — FLUCYTOSINE 250 MG PO CAPS
25.0000 mg/kg | ORAL_CAPSULE | Freq: Four times a day (QID) | ORAL | Status: DC
  Administered 2018-06-08 – 2018-06-21 (×53): 1750 mg via ORAL
  Filled 2018-06-08 (×57): qty 7

## 2018-06-08 MED ORDER — MORPHINE SULFATE (PF) 2 MG/ML IV SOLN
2.0000 mg | INTRAVENOUS | Status: DC | PRN
  Administered 2018-06-08 – 2018-06-18 (×6): 2 mg via INTRAVENOUS
  Filled 2018-06-08 (×7): qty 1

## 2018-06-08 MED ORDER — SODIUM CHLORIDE 0.9 % IV SOLN
2.0000 g | Freq: Two times a day (BID) | INTRAVENOUS | Status: DC
  Filled 2018-06-08: qty 20

## 2018-06-08 MED ORDER — ONDANSETRON HCL 4 MG/2ML IJ SOLN
4.0000 mg | Freq: Four times a day (QID) | INTRAMUSCULAR | Status: DC | PRN
  Administered 2018-06-17 – 2018-06-21 (×5): 4 mg via INTRAVENOUS
  Filled 2018-06-08 (×5): qty 2

## 2018-06-08 MED ORDER — VANCOMYCIN HCL 10 G IV SOLR
1500.0000 mg | Freq: Once | INTRAVENOUS | Status: DC
  Filled 2018-06-08: qty 1500

## 2018-06-08 MED ORDER — POTASSIUM CHLORIDE CRYS ER 20 MEQ PO TBCR
40.0000 meq | EXTENDED_RELEASE_TABLET | Freq: Once | ORAL | Status: AC
  Administered 2018-06-08: 40 meq via ORAL
  Filled 2018-06-08: qty 2

## 2018-06-08 MED ORDER — OXYCODONE HCL 5 MG PO TABS
5.0000 mg | ORAL_TABLET | Freq: Four times a day (QID) | ORAL | Status: DC | PRN
  Administered 2018-06-10 – 2018-06-21 (×21): 5 mg via ORAL
  Filled 2018-06-08 (×21): qty 1

## 2018-06-08 MED ORDER — DIPHENHYDRAMINE HCL 50 MG/ML IJ SOLN: 25.0000 mg | Freq: Every day | INTRAMUSCULAR | Status: DC | PRN

## 2018-06-08 MED ORDER — DEXTROSE 5 % IV SOLN
4.0000 mg/kg | Freq: Every day | INTRAVENOUS | Status: DC
  Administered 2018-06-08 – 2018-06-12 (×5): 290 mg via INTRAVENOUS
  Filled 2018-06-08 (×4): qty 290
  Filled 2018-06-08: qty 0
  Filled 2018-06-08 (×3): qty 290

## 2018-06-08 MED ORDER — ENSURE ENLIVE PO LIQD
237.0000 mL | Freq: Two times a day (BID) | ORAL | Status: DC
  Administered 2018-06-08 – 2018-06-21 (×16): 237 mL via ORAL

## 2018-06-08 MED ORDER — VANCOMYCIN HCL IN DEXTROSE 1-5 GM/200ML-% IV SOLN: 1000.0000 mg | Freq: Three times a day (TID) | INTRAVENOUS | Status: DC

## 2018-06-08 MED ORDER — NYSTATIN 100000 UNIT/ML MT SUSP
5.0000 mL | Freq: Four times a day (QID) | OROMUCOSAL | Status: DC
  Administered 2018-06-08 – 2018-06-21 (×47): 500000 [IU] via ORAL
  Filled 2018-06-08 (×50): qty 5

## 2018-06-08 MED ORDER — ACETAMINOPHEN 650 MG RE SUPP: 650.0000 mg | Freq: Four times a day (QID) | RECTAL | Status: DC | PRN

## 2018-06-08 MED ORDER — TRAMADOL HCL 50 MG PO TABS
50.0000 mg | ORAL_TABLET | Freq: Four times a day (QID) | ORAL | Status: DC | PRN
  Administered 2018-06-08: 50 mg via ORAL
  Filled 2018-06-08: qty 1

## 2018-06-08 MED ORDER — ACETAMINOPHEN 325 MG PO TABS
650.0000 mg | ORAL_TABLET | Freq: Four times a day (QID) | ORAL | Status: DC | PRN
  Administered 2018-06-08 – 2018-06-20 (×7): 650 mg via ORAL
  Filled 2018-06-08 (×8): qty 2

## 2018-06-08 MED ORDER — HYDROMORPHONE HCL 1 MG/ML IJ SOLN
1.0000 mg | Freq: Once | INTRAMUSCULAR | Status: AC
  Administered 2018-06-08: 1 mg via INTRAVENOUS
  Filled 2018-06-08: qty 1

## 2018-06-08 MED ORDER — DEXTROSE 5% FOR FLUSHING BEFORE AND AFTER AMBISOME
10.0000 mL | Freq: Every day | INTRAVENOUS | Status: DC
  Administered 2018-06-08 – 2018-06-11 (×4): 10 mL via INTRAVENOUS
  Filled 2018-06-08 (×8): qty 50

## 2018-06-08 MED ORDER — SODIUM CHLORIDE 0.9 % IV BOLUS FOR AMBISOME
500.0000 mL | Freq: Every day | INTRAVENOUS | Status: DC
  Administered 2018-06-08 – 2018-06-21 (×13): 500 mL via INTRAVENOUS

## 2018-06-08 NOTE — ED Notes (Signed)
Pt able to tolerate oral fluids and [place] sandwich without any nausea or vomiting.

## 2018-06-08 NOTE — Progress Notes (Signed)
Pharmacy Antibiotic Note  Christopher Henson is a 38 y.o. male admitted on 06/07/2018 with rule out meningitis.  Pharmacy has been consulted for vancomycin dosing.  Plan: Vancomycin 1500mg  IV x 1, then 1g IV q8h for estimated AUC 552 with predicted trough 16.4 (target > 15 mcg/ml) Check vancomycin levels at steady state Follow up renal function & cultures  Height: 6' (182.9 cm) Weight: 160 lb (72.6 kg) IBW/kg (Calculated) : 77.6  Temp (24hrs), Avg:98.2 F (36.8 C), Min:98 F (36.7 C), Max:98.4 F (36.9 C)  Recent Labs  Lab 06/05/18 0549 06/07/18 2341  WBC 4.5 6.6  CREATININE 0.80 0.90    Estimated Creatinine Clearance: 112 mL/min (by C-G formula based on SCr of 0.9 mg/dL).    No Known Allergies  Antimicrobials this admission:  2/29 Vancomycin >> 2/29 Rocephin >>  Dose adjustments this admission:   Microbiology results:  2/29 CSF: 2/29 CSF HSV:  Thank you for allowing pharmacy to be a part of this patient's care.  [name] Pacas, PharmD, BCPS Pager: [phone] 06/08/2018 7:17 AM

## 2018-06-08 NOTE — H&P (Signed)
[facility] History and Physical   Patient: Christopher Henson [id number]   PCP: Patient, No Pcp Per DOB: Jul 12, 1980   DOA: 06/07/2018   DOS: 06/08/2018   DOS: the patient was seen and examined on 06/07/2018  Patient coming from: The patient is coming from home.  Chief Complaint: Headache  HPI: Christopher Henson is a 38 y.o. male with Past medical history of alcohol use, GERD. Patient presents with complaints of headache.  This is ongoing since last 1 week. Associated with nausea as well as vomiting. Patient also report some dizziness without any vertigo. Patient was seen in the ER on June 05, 2018.  At which time his symptoms were concerning for HIV with the presence of oral thrush. Patient was given nystatin and was recommended to get an HIV checkup. As well as LP. Patient left the urgent care and now returns to the hospital with again worsening headache. Reports that this is a 10 out of 10 headache, worst headache of his life throbbing in nature. Denies any nausea at the time of my evaluation.  No diarrhea. Denies having any prior diagnosis of HIV. He reports that while he was in the prison he had a tattoo as well as he reported to ID provider that he had previous male partner who are HIV positive.  He has not notified his current male partner about his diagnosis yet.  ED Course: Presents to the hospital with the headache, undergoes LP which shows elevated CSF pressure.  With concern for meningitis patient was referred for admission.  At his baseline ambulates without support And is independent for most of his ADL; manages his medication on his own.  Review of Systems: as mentioned in the history of present illness.  All other systems reviewed and are negative.  Past Medical History:  Diagnosis Date  . Alcohol abuse   . GERD (gastroesophageal reflux disease)   . Thrombocytopenia ([place])    History reviewed. No pertinent surgical history. Social History:  reports that he  has been smoking cigarettes. He has been smoking about 1.00 pack per day. He has never used smokeless tobacco. He reports current alcohol use. He reports previous drug use. Drug: Marijuana.  No Known Allergies   Family History  Problem Relation Age of Onset  . Diabetes Mother   . Hypertension Mother   . Diabetes Father   . Hypertension Father      Prior to Admission medications   Medication Sig Start Date End Date Taking? Authorizing Provider  acetaminophen (TYLENOL) 500 MG tablet Take 500-1,000 mg by mouth 3 (three) times daily as needed (pain).    Yes [provider]  naproxen sodium (ALEVE) 220 MG tablet Take 220 mg by mouth.   Yes [provider]  nystatin (MYCOSTATIN) 100000 UNIT/ML suspension Take 5 mLs (500,000 Units total) by mouth 4 (four) times daily. Swish and spit this medication 06/05/18  Yes Ripley Fraise, MD  metoCLOPramide (REGLAN) 10 MG tablet Take 1 tablet (10 mg total) by mouth every 8 (eight) hours as needed for nausea (nausea/headache). Patient not taking: Reported on 06/08/2018 06/05/18   Ripley Fraise, MD    Physical Exam: Vitals:   06/08/18 0520 06/08/18 0540 06/08/18 0625 06/08/18 1032  BP: (!) 127/94 122/88 123/85 126/86  Pulse: 65 72 76 83  Resp: '15 17 16 16  '$ Temp: 98.2 F (36.8 C) 98 F (36.7 C) 98.3 F (36.8 C) 99.7 F (37.6 C)  TempSrc:   Oral Oral  SpO2: 100% 99% 92%  Weight:      Height:        General: Alert, Awake and Oriented to Time, Place and Person. Appear in moderate distress, affect appropriate Eyes: PERRL, Conjunctiva normal ENT: Oral Mucosa shows thrush moist. Neck: no JVD, no Abnormal Mass Or lumps Cardiovascular: S1 and S2 Present, no Murmur, Peripheral Pulses Present Respiratory: normal respiratory effort, Bilateral Air entry equal and Decreased, no use of accessory muscle, Clear to Auscultation, no Crackles, no wheezes Abdomen: Bowel Sound present, Soft and no tenderness, no hernia Skin: no redness, no  Rash, no induration Extremities: no Pedal edema, no calf tenderness Neurologic: Grossly no focal neuro deficit. Bilaterally Equal motor strength  Labs on Admission:  CBC: Recent Labs  Lab 06/05/18 0549 06/07/18 2341  WBC 4.5 6.6  NEUTROABS 4.0  --   HGB 12.3* 12.7*  HCT 37.3* 38.7*  MCV 83.8 85.4  PLT 147* [id number]   Basic Metabolic Panel: Recent Labs  Lab 06/05/18 0549 06/07/18 2341 06/08/18 1037  NA 140 141 144  K 3.5 3.3* 3.4*  CL 101 105 107  CO2 '27 28 29  '$ GLUCOSE 111* 119* 135*  BUN '13 18 15  '$ CREATININE 0.80 0.90 0.91  CALCIUM 8.7* 8.9 8.6*  MG  --   --  2.0   GFR: Estimated Creatinine Clearance: 110.8 mL/min (by C-G formula based on SCr of 0.91 mg/dL). Liver Function Tests: Recent Labs  Lab 06/05/18 0549 06/07/18 2341  AST 55* 43*  ALT 77* 60*  ALKPHOS 98 105  BILITOT 0.9 0.8  PROT 6.8 7.0  ALBUMIN 3.5 3.6   Recent Labs  Lab 06/07/18 2341  LIPASE 36   No results for input(s): AMMONIA in the last 168 hours. Coagulation Profile: No results for input(s): INR, PROTIME in the last 168 hours. Cardiac Enzymes: No results for input(s): CKTOTAL, CKMB, CKMBINDEX, TROPONINI in the last 168 hours. BNP (last 3 results) No results for input(s): PROBNP in the last 8760 hours. HbA1C: No results for input(s): HGBA1C in the last 72 hours. CBG: No results for input(s): GLUCAP in the last 168 hours. Lipid Profile: No results for input(s): CHOL, HDL, LDLCALC, TRIG, CHOLHDL, LDLDIRECT in the last 72 hours. Thyroid Function Tests: No results for input(s): TSH, T4TOTAL, FREET4, T3FREE, THYROIDAB in the last 72 hours. Anemia Panel: No results for input(s): VITAMINB12, FOLATE, FERRITIN, TIBC, IRON, RETICCTPCT in the last 72 hours. Urine analysis:    Component Value Date/Time   COLORURINE AMBER (A) 06/07/2018 2333   APPEARANCEUR HAZY (A) 06/07/2018 2333   LABSPEC 1.031 (H) 06/07/2018 2333   PHURINE 6.0 06/07/2018 2333   GLUCOSEU NEGATIVE 06/07/2018 2333   HGBUR  NEGATIVE 06/07/2018 2333   [place] 06/07/2018 2333   [place] 06/07/2018 2333   PROTEINUR 100 (A) 06/07/2018 2333   NITRITE NEGATIVE 06/07/2018 2333   LEUKOCYTESUR NEGATIVE 06/07/2018 2333    Radiological Exams on Admission: No results found.  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 LP was performed after CT scan which shows presence of opening pressure of 35 mm. Gram stain of CSF positive for yeast, cryptococcal titers were elevated as well. This is concerning for cryptococcal meningitis and therefore patient was started on IV amphotericin B as well as Flucytosine. Patient will require repeat LP to ensure that his opening CSF pressure goes less than 120. We will provide supportive measures for now with IV fluids, IV pain medication, oral pain medication. Continue to monitor on telemetry given the risk for deterioration. Appreciate  ID input  2.  New diagnosis of HIV. AIDS. Presents with complaints of oral thrush. Currently being treated for K meningitis. Management per ID once hemodynamically stable from cryptococcal meningitis point of view.  3.  Mild hypokalemia. Being replaced. We will check tomorrow  Nutrition: Regular diet DVT Prophylaxis: subcutaneous Heparin  Advance goals of care discussion: Full code  Consults: Infectious disease  Family Communication: no family was present at bedside, at the time of interview.  Disposition: Admitted as inpatient, telemetry unit. Likely to be discharged home, in 5 days.  Author: Berle Mull, MD [facility] 06/08/2018  To reach On-call, see care teams to locate the attending and reach out to them via [email or web address]. If 7PM-7AM, please contact night-coverage If you still have difficulty reaching the attending provider, please page the [facility] (Director on Call) for [facility] on amion for assistance.

## 2018-06-08 NOTE — Progress Notes (Signed)
CRITICAL VALUE STICKER  CRITICAL VALUE: postitive Cryptoccoccal Antigen  RECEIVER (on-site recipient of call): Elease Etienne, RN   DATE & TIME NOTIFIED: 06/08/2018 0813 am  MESSENGER (representative from lab): Lab Report reviewed. FCP  MD NOTIFIED:  Lynden Oxford, MD  TIME OF NOTIFICATION: 06/08/18 0815am  RESPONSE: MD will be in room to speak to patient. Hold antibiotics that are currently ordered. New orders to be placed.

## 2018-06-08 NOTE — ED Notes (Signed)
ED TO INPATIENT HANDOFF REPORT  Name/Age/Gender Christopher Henson 38 y.o. male  Code Status   Home/SNF/Other Home  Chief Complaint Flu Like Symptoms   Level of Care/Admitting Diagnosis ED Disposition    ED Disposition Condition Comment   Admit  Hospital Area: [facility] [[id number]]  Level of Care: Med-Surg [16]  Diagnosis: Headache [[id number]]  Admitting Physician: Eduard Clos [[id number]]  Attending Physician: Eduard Clos [phone]  Estimated length of stay: past midnight tomorrow  Certification:: I certify this patient will need inpatient services for at least 2 midnights  PT Class (Do Not Modify): Inpatient [101]  PT Acc Code (Do Not Modify): Private [1]       Medical History Past Medical History:  Diagnosis Date  . Alcohol abuse   . GERD (gastroesophageal reflux disease)   . Thrombocytopenia ([facility])     Allergies No Known Allergies  IV Location/Drains/Wounds Patient Lines/Drains/Airways Status   Active Line/Drains/Airways    Name:   Placement date:   Placement time:   Site:   Days:   Peripheral IV 06/07/18 Right Antecubital   06/07/18    2341    Antecubital   1          Labs/Imaging Results for orders placed or performed during the hospital encounter of 06/07/18 (from the past 48 hour(s))  Urinalysis, Routine w reflex microscopic     Status: Abnormal   Collection Time: 06/07/18 11:33 PM  Result Value Ref Range   Color, Urine AMBER (A) YELLOW    Comment: BIOCHEMICALS MAY BE AFFECTED BY COLOR   APPearance HAZY (A) CLEAR   Specific Gravity, Urine 1.031 (H) 1.005 - 1.030   pH 6.0 5.0 - 8.0   Glucose, UA NEGATIVE NEGATIVE mg/dL   Hgb urine dipstick NEGATIVE NEGATIVE   Bilirubin Urine NEGATIVE NEGATIVE   Ketones, ur NEGATIVE NEGATIVE mg/dL   Protein, ur 767 (A) NEGATIVE mg/dL   Nitrite NEGATIVE NEGATIVE   Leukocytes,Ua NEGATIVE NEGATIVE   RBC / HPF 6-10 0 - 5 RBC/hpf   WBC, UA 0-5 0 - 5 WBC/hpf   Bacteria, UA RARE (A) NONE SEEN   Mucus PRESENT     Comment: Performed at [facility], [address] [place], [place] [address]  Lipase, blood     Status: None   Collection Time: 06/07/18 11:41 PM  Result Value Ref Range   Lipase 36 11 - 51 U/L    Comment: Performed at [facility], [address], [place], [place] [address]  Comprehensive metabolic panel     Status: Abnormal   Collection Time: 06/07/18 11:41 PM  Result Value Ref Range   Sodium 141 135 - 145 mmol/L   Potassium 3.3 (L) 3.5 - 5.1 mmol/L   Chloride 105 98 - 111 mmol/L   CO2 28 22 - 32 mmol/L   Glucose, Bld 119 (H) 70 - 99 mg/dL   BUN 18 6 - 20 mg/dL   Creatinine, Ser 3.66 0.61 - 1.24 mg/dL   Calcium 8.9 8.9 - 29.4 mg/dL   Total Protein 7.0 6.5 - 8.1 g/dL   Albumin 3.6 3.5 - 5.0 g/dL   AST 43 (H) 15 - 41 U/L   ALT 60 (H) 0 - 44 U/L   Alkaline Phosphatase 105 38 - 126 U/L   Total Bilirubin 0.8 0.3 - 1.2 mg/dL   GFR calc non Af Amer >60 >60 mL/min   GFR calc Af Amer >60 >60 mL/min   Anion gap 8  5 - 15    Comment: Performed at [facility], [address], [place], [place] [address]  CBC     Status: Abnormal   Collection Time: 06/07/18 11:41 PM  Result Value Ref Range   WBC 6.6 4.0 - 10.5 K/uL   RBC 4.53 4.22 - 5.81 MIL/uL   Hemoglobin 12.7 (L) 13.0 - 17.0 g/dL   HCT 01.0 (L) 27.2 - 53.6 %   MCV 85.4 80.0 - 100.0 fL   MCH 28.0 26.0 - 34.0 pg   MCHC 32.8 30.0 - 36.0 g/dL   RDW 64.4 03.4 - 74.2 %   Platelets 177 150 - 400 K/uL   nRBC 0.3 (H) 0.0 - 0.2 %    Comment: Performed at [facility], [address] [place], [place] [address]  Glucose, CSF     Status: None   Collection Time: 06/08/18  3:23 AM  Result Value Ref Range   Glucose, CSF 50 40 - 70 mg/dL    Comment: Performed at [facility], [address] [place], [place] [address]  Protein, CSF     Status: None   Collection Time: 06/08/18  3:23 AM  Result Value Ref Range   Total  Protein,  CSF 35 15 - 45 mg/dL    Comment: Performed at [facility], [address] [place], [place] [address]   No results found.  Pending Labs Unresulted Labs (From admission, onward)    Start     Ordered   06/08/18 0157  T-helper cells (CD4) count (not at [facility])  Once,   R     06/08/18 0158   06/08/18 0157  CSF cell count with differential collection tube #: 1  [facility] ED ADULT CSF PANEL)  ONCE - STAT,   STAT    Question:  collection tube #  Answer:  1   06/08/18 0158   06/08/18 0157  CSF cell count with differential collection tube #: 4  [facility] ED ADULT CSF PANEL)  ONCE - STAT,   STAT    Question:  collection tube #  Answer:  4   06/08/18 0158   06/08/18 0157  CSF culture  [facility] ED ADULT CSF PANEL)  ONCE - STAT,   STAT    Question Answer Comment  Are there also cytology or pathology orders on this specimen? Yes   Patient immune status Immunocompromised      06/08/18 0158   06/08/18 0157  Gram stain  [facility] ED ADULT CSF PANEL)  ONCE - STAT,   STAT    Question:  Patient immune status  Answer:  Immunocompromised   06/08/18 0158   06/08/18 0157  Cryptococcal antigen, CSF  [facility] ED ADULT CSF PANEL)  ONCE - STAT,   STAT     06/08/18 0158   06/08/18 0157  Herpes simplex virus (HSV), DNA by PCR Cerebrospinal Fluid  [facility] ED ADULT CSF PANEL)  ONCE - STAT,   STAT     06/08/18 0158   06/08/18 0157  Hsv Culture And Typing  [facility] ED ADULT CSF PANEL)  ONCE - STAT,   STAT    Question:  Patient immune status  Answer:  Immunocompromised   06/08/18 0158   06/08/18 0157  VDRL, CSF  ([facility] ED ADULT CSF PANEL)  ONCE - STAT,   STAT     06/08/18 0158   06/08/18 0157  Arbovirus IgG, CSF  ([facility] ED ADULT CSF PANEL)  ONCE - STAT,  STAT     06/08/18 0158   06/08/18 0133  Cryptococcal antigen  Once,   R     06/08/18 0133          Vitals/Pain Today's Vitals   06/08/18 0440 06/08/18 0500 06/08/18 0520 06/08/18 0540  BP: 118/85 132/89 (!) 127/94 122/88  Pulse: 68 73 65 72  Resp: 18 17 15 17   Temp:  98.2 F (36.8 C) 98.4 F (36.9 C) 98.2 F (36.8 C) 98 F (36.7 C)  TempSrc:  Oral    SpO2: 94% 95% 100% 99%  Weight:      Height:      PainSc:        Isolation Precautions No active isolations  Medications Medications  sodium chloride flush (NS) 0.9 % injection 3 mL (3 mLs Intravenous Given 06/07/18 2343)  sodium chloride 0.9 % bolus 1,000 mL (0 mLs Intravenous Stopped 06/08/18 0046)  ondansetron (ZOFRAN) injection 4 mg (4 mg Intravenous Given 06/07/18 2356)  alum & mag hydroxide-simeth (MAALOX/MYLANTA) 200-200-20 MG/5ML suspension 30 mL (30 mLs Oral Given 06/07/18 2356)    And  lidocaine (XYLOCAINE) 2 % viscous mouth solution 15 mL (15 mLs Oral Given 06/07/18 2356)  acetaminophen (TYLENOL) tablet 1,000 mg (1,000 mg Oral Given 06/08/18 0045)  HYDROmorphone (DILAUDID) injection 1 mg (1 mg Intravenous Given 06/08/18 0206)  lidocaine-EPINEPHrine (XYLOCAINE W/EPI) 2 %-1:200000 (PF) injection 10 mL (10 mLs Intradermal Given by Other 06/08/18 0206)    Mobility walks

## 2018-06-08 NOTE — Progress Notes (Signed)
Receiving patient from 6th floor to our unit, question regarding patients need for contact precautions.  Spoke with Cordelia Pen from Infection Prevention who stated patient needs to be on droplet for 24 hours after starting antibiotic treatment (will start 06/08/2018 at 12 noon).  Droplet precautions initiated.

## 2018-06-08 NOTE — Consult Note (Addendum)
[facility]  Total days of antibiotics 1        Day 1 flucytosine/amphotericn               Reason for Consult: newly diagnosed hiv disease/CM   Referring Physician: patel  Active Problems:   Headache    HPI: Christopher Henson is a 38 y.o. male  admitted with thrush and headache. Blood work revealed Cr Ag 1:2560. Not previously known to be hiv positive Underwent LP with opening pressure of 35. CSF analysis showed 0 wbc/glu 50/protein 35. Csf showing yeast on gram stain. He reports that this is the first time being tested for hiv. His RF include sex with women, he thinks that one of his previous male partners was hiv positive, roughly 6-7 yrs ago. He denies any sex with men or ivdu. He states that both symptoms of headache and thrush started roughly 2 wks ago. He still feels that he has moderate headache but improved from yesterday  Past Medical History:  Diagnosis Date  . Alcohol abuse   . GERD (gastroesophageal reflux disease)   . Thrombocytopenia (HCC)     Allergies: No Known Allergies   MEDICATIONS: . dextrose  10 mL Intravenous Daily  . dextrose  10 mL Intravenous Q1200  . flucytosine  25 mg/kg Oral Q6H  . nystatin  5 mL Oral QID  . potassium chloride  40 mEq Oral Once  . sodium chloride  500 mL Intravenous Daily  . sodium chloride  500 mL Intravenous Q1200    Social History   Tobacco Use  . Smoking status: Current Every Day Smoker    Packs/day: 1.00    Types: Cigarettes  . Smokeless tobacco: Never Used  Substance Use Topics  . Alcohol use: Yes  . Drug use: Not Currently    Types: Marijuana    Family History  Problem Relation Age of Onset  . Diabetes Mother   . Hypertension Mother   . Diabetes Father   . Hypertension Father      Review of Systems  Constitutional: positive  for fever, chills, diaphoresis, activity change, appetite change, fatigue and unexpected weight change.  HENT: Negative for congestion, sore throat,  rhinorrhea, sneezing, trouble swallowing and sinus pressure.  Eyes: Negative for photophobia and visual disturbance.  Respiratory: Negative for cough, chest tightness, shortness of breath, wheezing and stridor.  Cardiovascular: Negative for chest pain, palpitations and leg swelling.  Gastrointestinal: Negative for nausea, vomiting, abdominal pain, diarrhea, constipation, blood in stool, abdominal distention and anal bleeding.  Genitourinary: Negative for dysuria, hematuria, flank pain and difficulty urinating.  Musculoskeletal: Negative for myalgias, back pain, joint swelling, arthralgias and gait problem.  Skin: Negative for color change, pallor, rash and wound.  Neurological: Negative for dizziness, tremors, weakness and light-headedness.  Hematological: Negative for adenopathy. Does not bruise/bleed easily.  Psychiatric/Behavioral: Negative for behavioral problems, confusion, sleep disturbance, dysphoric mood, decreased concentration and agitation.    OBJECTIVE: Temp:  [98 F (36.7 C)-99.7 F (37.6 C)] 99.7 F (37.6 C) (02/29 1032) Pulse Rate:  [65-85] 83 (02/29 1032) Resp:  [15-19] 16 (02/29 1032) BP: (114-144)/(85-98) 126/86 (02/29 1032) SpO2:  [90 %-100 %] 92 % (02/29 0625) Weight:  [72.6 kg] 72.6 kg (02/28 2316) Physical Exam  Constitutional: He is oriented to person, place, and time. He appears well-developed and well-nourished. No distress.  HENT: thrush+ Mouth/Throat: Oropharynx is clear and moist. No oropharyngeal exudate.  Neck: nuchal rigidity-mild Cardiovascular: Normal rate, regular rhythm and normal  heart sounds. Exam reveals no gallop and no friction rub.  No murmur heard.  Pulmonary/Chest: Effort normal and breath sounds normal. No respiratory distress. He has no wheezes.  Abdominal: Soft. Bowel sounds are normal. He exhibits no distension. There is no tenderness.  Lymphadenopathy:  cervical adenopathy.  Neurological: He is alert and oriented to person, place, and  time.  Skin: Skin is warm and dry. No rash noted. No erythema.  Psychiatric: He has a normal mood and affect. His behavior is normal.    LABS: Results for orders placed or performed during the hospital encounter of 06/07/18 (from the past 48 hour(s))  Urinalysis, Routine w reflex microscopic     Status: Abnormal   Collection Time: 06/07/18 11:33 PM  Result Value Ref Range   Color, Urine AMBER (A) YELLOW    Comment: BIOCHEMICALS MAY BE AFFECTED BY COLOR   APPearance HAZY (A) CLEAR   Specific Gravity, Urine 1.031 (H) 1.005 - 1.030   pH 6.0 5.0 - 8.0   Glucose, UA NEGATIVE NEGATIVE mg/dL   Hgb urine dipstick NEGATIVE NEGATIVE   Bilirubin Urine NEGATIVE NEGATIVE   Ketones, ur NEGATIVE NEGATIVE mg/dL   Protein, ur 454 (A) NEGATIVE mg/dL   Nitrite NEGATIVE NEGATIVE   Leukocytes,Ua NEGATIVE NEGATIVE   RBC / HPF 6-10 0 - 5 RBC/hpf   WBC, UA 0-5 0 - 5 WBC/hpf   Bacteria, UA RARE (A) NONE SEEN   Mucus PRESENT     Comment: Performed at [facility], [address] [place], [place] [address]  Lipase, blood     Status: None   Collection Time: 06/07/18 11:41 PM  Result Value Ref Range   Lipase 36 11 - 51 U/L    Comment: Performed at [facility], [address] [place], [place] [address]  Comprehensive metabolic panel     Status: Abnormal   Collection Time: 06/07/18 11:41 PM  Result Value Ref Range   Sodium 141 135 - 145 mmol/L   Potassium 3.3 (L) 3.5 - 5.1 mmol/L   Chloride 105 98 - 111 mmol/L   CO2 28 22 - 32 mmol/L   Glucose, Bld 119 (H) 70 - 99 mg/dL   BUN 18 6 - 20 mg/dL   Creatinine, Ser 2.95 0.61 - 1.24 mg/dL   Calcium 8.9 8.9 - 62.1 mg/dL   Total Protein 7.0 6.5 - 8.1 g/dL   Albumin 3.6 3.5 - 5.0 g/dL   AST 43 (H) 15 - 41 U/L   ALT 60 (H) 0 - 44 U/L   Alkaline Phosphatase 105 38 - 126 U/L   Total Bilirubin 0.8 0.3 - 1.2 mg/dL   GFR calc non Af Amer >60 >60 mL/min   GFR calc Af Amer >60 >60 mL/min   Anion gap 8 5 - 15    Comment:  Performed at [facility], [address], [place], [place] [address]  CBC     Status: Abnormal   Collection Time: 06/07/18 11:41 PM  Result Value Ref Range   WBC 6.6 4.0 - 10.5 K/uL   RBC 4.53 4.22 - 5.81 MIL/uL   Hemoglobin 12.7 (L) 13.0 - 17.0 g/dL   HCT 78.4 (L) 69.6 - 29.5 %   MCV 85.4 80.0 - 100.0 fL   MCH 28.0 26.0 - 34.0 pg   MCHC 32.8 30.0 - 36.0 g/dL   RDW 28.4 13.2 - 44.0 %   Platelets 177 150 - 400 K/uL   nRBC 0.3 (H) 0.0 -  0.2 %    Comment: Performed at [facility], [address] [place], [place] [address]  Cryptococcal antigen     Status: Abnormal   Collection Time: 06/08/18  1:33 AM  Result Value Ref Range   Crypto Ag POSITIVE (A) NEGATIVE   Cryptococcal Ag Titer >2,560 (A) NOT INDICATED    Comment: CRITICAL RESULT CALLED TO, READ BACK BY AND VERIFIED WITH: RN [facility] HANSEN [phone] FCP   CSF cell count with differential collection tube #: 1     Status: Abnormal   Collection Time: 06/08/18  1:57 AM  Result Value Ref Range   Tube # 1    Color, CSF COLORLESS COLORLESS   Appearance, CSF CLEAR (A) CLEAR   Supernatant NOT INDICATED    RBC Count, CSF 0 0 /cu mm   WBC, CSF 0 0 - 5 /cu mm   Other Cells, CSF TOO FEW TO COUNT, SMEAR AVAILABLE FOR REVIEW     Comment: EXTRACELLULAR ORGANISM SEEN PATH REVIEW REQUESTED CRITICAL RESULT CALLED TO, READ BACK BY AND VERIFIED WITHClotilde Dieter RN [phone] 06/08/2018 [place] Performed at [facility], [address], [place], [place] [address]   CSF culture     Status: None (Preliminary result)   Collection Time: 06/08/18  1:57 AM  Result Value Ref Range   Specimen Description      BACK Performed at [facility], [address] [place], [place] [address]    Special Requests      Immunocompromised Performed at [facility], [address] [place], [place] [address]    Gram Stain      NO WBC SEEN YEAST CRITICAL RESULT CALLED TO,  READ BACK BY AND VERIFIED WITH: Kerry Kass RN, AT [phone] 06/08/18 BY Renato Shin Performed at [facility], [address], [place], [place] [address]    Culture YEAST    Report Status PENDING   CSF cell count with differential collection tube #: 4     Status: Abnormal   Collection Time: 06/08/18  3:23 AM  Result Value Ref Range   Tube # 4    Color, CSF COLORLESS COLORLESS   Appearance, CSF CLEAR (A) CLEAR   Supernatant NOT INDICATED    RBC Count, CSF 0 0 /cu mm   WBC, CSF 0 0 - 5 /cu mm   Other Cells, CSF TOO FEW TO COUNT, SMEAR AVAILABLE FOR REVIEW     Comment: EXTRACELLULAR ORGANISMS SEEN PATHOLOGIST REVIEW REQUESTED CRITICAL RESULT CALLED TO, READ BACK BY AND VERIFIED WITHClotilde Dieter RN [phone] 06/08/2018 [place] K Performed at [facility], [address] [place], [place] [address]   Glucose, CSF     Status: None   Collection Time: 06/08/18  3:23 AM  Result Value Ref Range   Glucose, CSF 50 40 - 70 mg/dL    Comment: Performed at [facility], [address], [place], [place] [address]  Protein, CSF     Status: None   Collection Time: 06/08/18  3:23 AM  Result Value Ref Range   Total  Protein, CSF 35 15 - 45 mg/dL    Comment: Performed at [facility], [address] [place], [place] [address]  Cryptococcal antigen, CSF     Status: Abnormal   Collection Time: 06/08/18  3:23 AM  Result Value Ref Range   Crypto Ag POSITIVE (A) NEGATIVE   Cryptococcal Ag Titer >2,560 (A) NOT  INDICATED    Comment: RBV Dellia Nims [phone] FCP Performed at [facility], [address] [place], [place] [address]   Basic metabolic panel     Status: Abnormal   Collection Time: 06/08/18 10:37 AM  Result Value Ref Range   Sodium 144 135 - 145 mmol/L   Potassium 3.4 (L) 3.5 - 5.1 mmol/L   Chloride 107 98 - 111 mmol/L   CO2 29 22 - 32 mmol/L   Glucose, Bld 135 (H) 70 - 99 mg/dL   BUN 15 6 - 20 mg/dL   Creatinine, Ser 6.62 0.61 -  1.24 mg/dL   Calcium 8.6 (L) 8.9 - 10.3 mg/dL   GFR calc non Af Amer >60 >60 mL/min   GFR calc Af Amer >60 >60 mL/min   Anion gap 8 5 - 15    Comment: Performed at [facility], [address] [place], [place] [address]  Magnesium     Status: None   Collection Time: 06/08/18 10:37 AM  Result Value Ref Range   Magnesium 2.0 1.7 - 2.4 mg/dL    Comment: Performed at [facility], [address] [place], [place] [address]    MICRO: reviewed IMAGING: No results found.  Assessment/Plan:  38yo M with newly diagnosis with hiv disease c/b CM and candidiasis  CM = continue L-ampho and flucytosis; may need to repeat LP early next week (mon/tuesday)  to follow OP  HIV disease= will wait to start antivirals. But will provide bactrim prophylaxis. Will check cd 4 count and HIV VL, genotype. RPR, and hepatitis panel  Thrush = will give swish and swallow. But also getting coverage with L-ampho

## 2018-06-08 NOTE — Progress Notes (Signed)
Pharmacy Antibiotic Note  Christopher Henson is a 38 y.o. male admitted on 06/07/2018 with rule out meningitis.  Pharmacy has been consulted for vancomycin dosing; Rocephin per MD  Now with positive cryptococcal antigen, liposomal amphotericin B started as well as Fluconazole per pharmacy for oropharyngeal candidiasis.  Plan:  Continue vancomycin and Rocephin as ordered  Ambisome 4 mg/kg q24 hr ordered, dosing appropriate  PRN Tylenol, Benadyl, and Demerol for infusion reactions  Pre- and Post-hydration with 500 ml NS boluses  D5W flushes before and after Ambisome d/t incompatibility with NS (if dedicated line unavailable)  Daily Bmet/Mg ordered; Pharmacy to replace K and Mag daily per protocol  Monitor closely for nephrotoxicity, infusion reactions, electrolyte abnormalities, and extravasation  Flucytosine 25 mg/kg q6 hr per MD, dosing appropriate  Fluconazole 200 mg IV daily (per ID recommendations)  Height: 6' (182.9 cm) Weight: 160 lb (72.6 kg) IBW/kg (Calculated) : 77.6  Temp (24hrs), Avg:98.2 F (36.8 C), Min:98 F (36.7 C), Max:98.4 F (36.9 C)  Recent Labs  Lab 06/05/18 0549 06/07/18 2341  WBC 4.5 6.6  CREATININE 0.80 0.90    Estimated Creatinine Clearance: 112 mL/min (by C-G formula based on SCr of 0.9 mg/dL).    No Known Allergies  Antimicrobials this admission:  2/29 Vancomycin >> 2/29 Rocephin >> 2/29 Ambisome >> 2/29 Diflucan >>  Dose adjustments this admission: n/a  Microbiology results:  2/29 CSF: yeast on stain, pending  HSV: pending  Cryptococcal Ag: positive   Thank you for allowing pharmacy to be a part of this patient's care.  Bernadene Person, PharmD, BCPS [phone] 06/08/2018, 9:42 AM

## 2018-06-09 ENCOUNTER — Inpatient Hospital Stay (HOSPITAL_COMMUNITY): Payer: BLUE CROSS/BLUE SHIELD

## 2018-06-09 DIAGNOSIS — B370 Candidal stomatitis: Secondary | ICD-10-CM

## 2018-06-09 DIAGNOSIS — D696 Thrombocytopenia, unspecified: Secondary | ICD-10-CM

## 2018-06-09 DIAGNOSIS — G4489 Other headache syndrome: Secondary | ICD-10-CM

## 2018-06-09 DIAGNOSIS — B20 Human immunodeficiency virus [HIV] disease: Secondary | ICD-10-CM

## 2018-06-09 DIAGNOSIS — A419 Sepsis, unspecified organism: Secondary | ICD-10-CM

## 2018-06-09 HISTORY — DX: Human immunodeficiency virus (HIV) disease: B20

## 2018-06-09 HISTORY — PX: LUMBAR PUNCTURE: SHX1985

## 2018-06-09 HISTORY — DX: Thrombocytopenia, unspecified: D69.6

## 2018-06-09 LAB — COMPREHENSIVE METABOLIC PANEL
ALT: 40 U/L (ref 0–44)
AST: 30 U/L (ref 15–41)
Albumin: 3.5 g/dL (ref 3.5–5.0)
Alkaline Phosphatase: 110 U/L (ref 38–126)
Anion gap: 9 (ref 5–15)
BUN: 11 mg/dL (ref 6–20)
CO2: 26 mmol/L (ref 22–32)
Calcium: 8.8 mg/dL — ABNORMAL LOW (ref 8.9–10.3)
Chloride: 103 mmol/L (ref 98–111)
Creatinine, Ser: 0.93 mg/dL (ref 0.61–1.24)
GFR calc Af Amer: >60 mL/min (ref >60)
GFR calc non Af Amer: >60 mL/min (ref >60)
Glucose, Bld: 109 mg/dL — ABNORMAL HIGH (ref 70–99)
Potassium: 3.8 mmol/L (ref 3.5–5.1)
Sodium: 138 mmol/L (ref 135–145)
Total Bilirubin: 0.9 mg/dL (ref 0.3–1.2)
Total Protein: 6.6 g/dL (ref 6.5–8.1)

## 2018-06-09 LAB — HSV DNA BY PCR (REFERENCE LAB)
HSV 1 DNA: NEGATIVE
HSV 2 DNA: NEGATIVE

## 2018-06-09 LAB — CBC
HCT: 40.9 % (ref 39.0–52.0)
Hemoglobin: 13.5 g/dL (ref 13.0–17.0)
MCH: 28.1 pg (ref 26.0–34.0)
MCHC: 33 g/dL (ref 30.0–36.0)
MCV: 85 fL (ref 80.0–100.0)
Platelets: 151 10*3/uL (ref 150–400)
RBC: 4.81 MIL/uL (ref 4.22–5.81)
RDW: 14.7 % (ref 11.5–15.5)
WBC: 10.4 10*3/uL (ref 4.0–10.5)
nRBC: 1.3 % — ABNORMAL HIGH (ref 0.0–0.2)

## 2018-06-09 LAB — MAGNESIUM: Magnesium: 1.8 mg/dL (ref 1.7–2.4)

## 2018-06-09 MED ORDER — SALINE SPRAY 0.65 % NA SOLN
1.0000 | NASAL | Status: DC | PRN
  Filled 2018-06-09: qty 44

## 2018-06-09 MED ORDER — METOPROLOL TARTRATE 25 MG PO TABS
12.5000 mg | ORAL_TABLET | Freq: Two times a day (BID) | ORAL | Status: DC
  Administered 2018-06-09 – 2018-06-13 (×9): 12.5 mg via ORAL
  Filled 2018-06-09 (×9): qty 1

## 2018-06-09 MED ORDER — MAGNESIUM SULFATE 2 GM/50ML IV SOLN
2.0000 g | Freq: Once | INTRAVENOUS | Status: AC
  Administered 2018-06-09: 2 g via INTRAVENOUS
  Filled 2018-06-09: qty 50

## 2018-06-09 MED ORDER — SODIUM CHLORIDE 0.9 % IV SOLN
INTRAVENOUS | Status: DC
  Administered 2018-06-09 – 2018-06-21 (×22): via INTRAVENOUS

## 2018-06-09 MED ORDER — SODIUM CHLORIDE 0.9 % IV SOLN
500.0000 mg | INTRAVENOUS | Status: DC
  Administered 2018-06-09 – 2018-06-10 (×2): 500 mg via INTRAVENOUS
  Filled 2018-06-09 (×2): qty 500

## 2018-06-09 MED ORDER — LABETALOL HCL 5 MG/ML IV SOLN
5.0000 mg | INTRAVENOUS | Status: DC | PRN
  Filled 2018-06-09: qty 4

## 2018-06-09 MED ORDER — SODIUM CHLORIDE 0.9 % IV SOLN
1.0000 g | INTRAVENOUS | Status: DC
  Administered 2018-06-09 – 2018-06-10 (×2): 1 g via INTRAVENOUS
  Filled 2018-06-09: qty 1
  Filled 2018-06-09: qty 10
  Filled 2018-06-09: qty 1

## 2018-06-09 NOTE — Progress Notes (Signed)
Notified primary MD of temp 101.5, orders received.  MEWS protocol implemented.

## 2018-06-09 NOTE — Plan of Care (Signed)
Patient continues with poor appetite, will drink ensure and some bites of meals.  Patient does relate a 40 lb weight loss over the last several months from lack of appetite.  Patient now requiring oxygen at 2 liters to remain in the 90's (see prior notes). .  States his headache is about the same (4-5) and has not wanted anything for the headache this shift.  Significant other and sister at bedside.

## 2018-06-09 NOTE — Treatment Plan (Addendum)
Pt became clinically septic this afternoon with fevers and tachycardia. Noted to become newly O2 dependent now on [facility]. CXR performed, still awaiting radiology read. Per my own read, R lung fields appear more hazy. Blood cx already drawn, pending. Will add empiric azithro and rocephin to cover PNA.

## 2018-06-09 NOTE — Progress Notes (Signed)
Resumed care for this patient at 0030. I agree with previous RN's assessment. Pt resting comfortably at this time. Will continue to monitor. 

## 2018-06-09 NOTE — Progress Notes (Signed)
PROGRESS NOTE    Christopher Henson  EVO:[id number] DOB: Dec 17, 1980 DOA: 06/07/2018 PCP: Patient, No Pcp Per    Brief Narrative:  38 y.o. male with Past medical history of alcohol use, GERD. Patient presents with complaints of headache.  This is ongoing since last 1 week. Associated with nausea as well as vomiting. Patient also report some dizziness without any vertigo. Patient was seen in the ER on June 05, 2018.  At which time his symptoms were concerning for HIV with the presence of oral thrush. Patient was given nystatin and was recommended to get an HIV checkup. As well as LP. Patient left the urgent care and now returns to the hospital with again worsening headache. Reports that this is a 10 out of 10 headache, worst headache of his life throbbing in nature. Denies any nausea at the time of my evaluation.  No diarrhea. Denies having any prior diagnosis of HIV. He reports that while he was in the prison he had a tattoo as well as he reported to ID provider that he had previous male partner who are HIV positive.  He has not notified his current male partner about his diagnosis yet.  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 LP was performed after CT scan which shows presence of opening pressure of 35 mm. Gram stain of CSF positive for yeast, cryptococcal titers were elevated as well. This is concerning for cryptococcal meningitis and therefore patient was started on IV amphotericin B as well as Flucytosine. Cont with repeat LP to ensure that his opening CSF pressure goes less than 120. We will provide supportive measures for now with IV fluids, IV pain medication, oral pain medication. Continue to monitor on telemetry given the risk for deterioration. Appreciate ID input  2.  New diagnosis of HIV. AIDS. Presents with complaints of oral thrush. Currently being treated for K meningitis. Management per  ID once hemodynamically stable from cryptococcal meningitis point of view. Tolerating abx thus far  3.  Mild hypokalemia. Replace  4. Sepsis from uncertain etiology, not present on admit -New finding -Not febrile with HR in the 140-150's -Will start basal IVF hydration -UA reviewed, clear -Will check CXR and blood cx   DVT prophylaxis: SCD's Code Status: Full Family Communication: Pt in room Disposition Plan: Uncertain at this time  Consultants:   ID  Procedures:     Antimicrobials: Anti-infectives (From admission, onward)   Start     Dose/Rate Route Frequency Ordered Stop   06/08/18 1800  vancomycin (VANCOCIN) IVPB 1000 mg/200 mL premix  Status:  Discontinued     1,000 mg 200 mL/hr over 60 Minutes Intravenous Every 8 hours 06/08/18 0723 06/08/18 1021   06/08/18 1600  fluconazole (DIFLUCAN) IVPB 200 mg  Status:  Discontinued     200 mg 100 mL/hr over 60 Minutes Intravenous Daily 06/08/18 0949 06/08/18 1021   06/08/18 1200  amphotericin B liposome (AMBISOME) 290 mg in dextrose 5 % 500 mL IVPB     4 mg/kg  72.6 kg 250 mL/hr over 120 Minutes Intravenous Daily 06/08/18 0858     06/08/18 1200  flucytosine (ANCOBON) capsule 1,750 mg     25 mg/kg  72.6 kg Oral Every 6 hours 06/08/18 0858     06/08/18 0800  vancomycin (VANCOCIN) 1,500 mg in sodium chloride 0.9 % 500 mL IVPB  Status:  Discontinued     1,500 mg 250 mL/hr over 120  Minutes Intravenous  Once 06/08/18 0655 06/08/18 1021   06/08/18 0700  cefTRIAXone (ROCEPHIN) 2 g in sodium chloride 0.9 % 100 mL IVPB  Status:  Discontinued     2 g 200 mL/hr over 30 Minutes Intravenous Every 12 hours 06/08/18 0651 06/08/18 1021       Subjective: Reports feeling better. States headache has improved  Objective: Vitals:   06/08/18 1032 06/08/18 2121 06/09/18 0518 06/09/18 0800  BP: 126/86 117/77 (!) 123/95   Pulse: 83 (!) 102 93   Resp: Temp: 99.7 F (37.6 C) 100 F (37.8 C) 99.4 F (37.4 C)   TempSrc: Oral  Oral Oral   SpO2:  90% 93% 100%  Weight:      Height:        Intake/Output Summary (Last 24 hours) at 06/09/2018 1350 Last data filed at 06/09/2018 1012 Gross per 24 hour  Intake 1946.3 ml  Output -  Net 1946.3 ml   Filed Weights   06/07/18 2316  Weight: 72.6 kg    Examination:  General exam: Appears calm and comfortable  Respiratory system: Clear to auscultation. Respiratory effort normal. Cardiovascular system: S1 & S2 heard, RRR Gastrointestinal system: Abdomen is nondistended, soft and nontender. No organomegaly or masses felt. Normal bowel sounds heard. Central nervous system: Alert and oriented. No focal neurological deficits. Extremities: Symmetric 5 x 5 power. Skin: No rashes, lesions Psychiatry: Judgement and insight appear normal. Mood & affect appropriate.   Data Reviewed: I have personally reviewed following labs and imaging studies  CBC: Recent Labs  Lab 06/05/18 0549 06/07/18 2341 06/09/18 0526  WBC 4.5 6.6 10.4  NEUTROABS 4.0  --   --   HGB 12.3* 12.7* 13.5  HCT 37.3* 38.7* 40.9  MCV 83.8 85.4 85.0  PLT 147* 177 151   Basic Metabolic Panel: Recent Labs  Lab 06/05/18 0549 06/07/18 2341 06/08/18 1037 06/09/18 0526  NA 140 141 144 138  K 3.5 3.3* 3.4* 3.8  CL 101 105 107 103  CO2 GLUCOSE 111* 119* 135* 109*  BUN CREATININE 0.80 0.90 0.91 0.93  CALCIUM 8.7* 8.9 8.6* 8.8*  MG  --   --  2.0 1.8   GFR: Estimated Creatinine Clearance: 108.4 mL/min (by C-G formula based on SCr of 0.93 mg/dL). Liver Function Tests: Recent Labs  Lab 06/05/18 0549 06/07/18 2341 06/09/18 0526  AST 55* 43* 30  ALT 77* 60* 40  ALKPHOS 98 105 110  BILITOT 0.9 0.8 0.9  PROT 6.8 7.0 6.6  ALBUMIN 3.5 3.6 3.5   Recent Labs  Lab 06/07/18 2341  LIPASE 36   No results for input(s): AMMONIA in the last 168 hours. Coagulation Profile: No results for input(s): INR, PROTIME in the last 168 hours. Cardiac Enzymes: No results for input(s):  CKTOTAL, CKMB, CKMBINDEX, TROPONINI in the last 168 hours. BNP (last 3 results) No results for input(s): PROBNP in the last 8760 hours. HbA1C: No results for input(s): HGBA1C in the last 72 hours. CBG: No results for input(s): GLUCAP in the last 168 hours. Lipid Profile: No results for input(s): CHOL, HDL, LDLCALC, TRIG, CHOLHDL, LDLDIRECT in the last 72 hours. Thyroid Function Tests: No results for input(s): TSH, T4TOTAL, FREET4, T3FREE, THYROIDAB in the last 72 hours. Anemia Panel: No results for input(s): VITAMINB12, FOLATE, FERRITIN, TIBC, IRON, RETICCTPCT in the last 72 hours. Sepsis Labs: No results for input(s): PROCALCITON, LATICACIDVEN in the last 168 hours.  Recent Results (from the past 240 hour(s))  CSF culture     Status: None (Preliminary result)   Collection Time: 06/08/18  1:57 AM  Result Value Ref Range Status   Specimen Description   Final    BACK Performed at [facility], [address], [place], [place] [address]    Special Requests   Final    Immunocompromised Performed at [facility], [address] [place], [place] [address]    Gram Stain   Final    NO WBC SEEN YEAST CRITICAL RESULT CALLED TO, READ BACK BY AND VERIFIED WITH: Kerry Kass RN, AT [phone] 06/08/18 BY D. VANHOOK    Culture   Final    YEAST CULTURE REINCUBATED FOR BETTER GROWTH Performed at [facility], [address] [place], [place] [address]    Report Status PENDING  Incomplete     Radiology Studies: No results found.  Scheduled Meds: . dextrose  10 mL Intravenous Daily  . dextrose  10 mL Intravenous Q1200  . feeding supplement (ENSURE ENLIVE)  237 mL Oral BID BM  . flucytosine  25 mg/kg Oral Q6H  . Influenza vac split quadrivalent PF  0.5 mL Intramuscular Tomorrow-1000  . metoprolol tartrate  12.5 mg Oral BID  . nystatin  5 mL Oral QID  . sodium chloride  500 mL Intravenous Daily  . sodium chloride  500 mL Intravenous Q1200    Continuous Infusions: . amphotericin  B  Liposome (AMBISOME) ADULT IV 290 mg (06/09/18 1126)     LOS: 1 day   Rickey Barbara, MD [facility] Pager On Amion  If 7PM-7AM, please contact night-coverage 06/09/2018, 1:50 PM

## 2018-06-09 NOTE — Progress Notes (Signed)
As per conversation with infection prevention nurse on 06/08/2018 droplet precautions discontinued as patient has been on antibiotics for over 24 hours.

## 2018-06-10 DIAGNOSIS — B370 Candidal stomatitis: Secondary | ICD-10-CM

## 2018-06-10 DIAGNOSIS — B379 Candidiasis, unspecified: Secondary | ICD-10-CM

## 2018-06-10 DIAGNOSIS — E43 Unspecified severe protein-calorie malnutrition: Secondary | ICD-10-CM

## 2018-06-10 DIAGNOSIS — Z8619 Personal history of other infectious and parasitic diseases: Secondary | ICD-10-CM

## 2018-06-10 DIAGNOSIS — Z8661 Personal history of infections of the central nervous system: Secondary | ICD-10-CM

## 2018-06-10 DIAGNOSIS — B20 Human immunodeficiency virus [HIV] disease: Secondary | ICD-10-CM

## 2018-06-10 DIAGNOSIS — B451 Cerebral cryptococcosis: Secondary | ICD-10-CM

## 2018-06-10 DIAGNOSIS — R0602 Shortness of breath: Secondary | ICD-10-CM

## 2018-06-10 LAB — CBC
HCT: 37.8 % — ABNORMAL LOW (ref 39.0–52.0)
Hemoglobin: 12.4 g/dL — ABNORMAL LOW (ref 13.0–17.0)
MCH: 27.9 pg (ref 26.0–34.0)
MCHC: 32.8 g/dL (ref 30.0–36.0)
MCV: 85.1 fL (ref 80.0–100.0)
Platelets: 121 10*3/uL — ABNORMAL LOW (ref 150–400)
RBC: 4.44 MIL/uL (ref 4.22–5.81)
RDW: 14.9 % (ref 11.5–15.5)
WBC: 7.7 10*3/uL (ref 4.0–10.5)
nRBC: 2 % — ABNORMAL HIGH (ref 0.0–0.2)

## 2018-06-10 LAB — BASIC METABOLIC PANEL
Anion gap: 9 (ref 5–15)
BUN: 12 mg/dL (ref 6–20)
CO2: 24 mmol/L (ref 22–32)
Calcium: 8.2 mg/dL — ABNORMAL LOW (ref 8.9–10.3)
Chloride: 105 mmol/L (ref 98–111)
Creatinine, Ser: 0.82 mg/dL (ref 0.61–1.24)
GFR calc Af Amer: >60 mL/min (ref >60)
GFR calc non Af Amer: >60 mL/min (ref >60)
Glucose, Bld: 97 mg/dL (ref 70–99)
Potassium: 4.2 mmol/L (ref 3.5–5.1)
Sodium: 138 mmol/L (ref 135–145)

## 2018-06-10 LAB — LIPID PANEL
Cholesterol: 131 mg/dL (ref 0–200)
HDL: 19 mg/dL — ABNORMAL LOW (ref >40)
LDL Cholesterol: 73 mg/dL (ref 0–99)
Total CHOL/HDL Ratio: 6.9 RATIO
Triglycerides: 195 mg/dL — ABNORMAL HIGH (ref <150)
VLDL: 39 mg/dL (ref 0–40)

## 2018-06-10 LAB — MAGNESIUM: Magnesium: 2.4 mg/dL (ref 1.7–2.4)

## 2018-06-10 LAB — HSV CULTURE AND TYPING

## 2018-06-10 LAB — VDRL, CSF: VDRL Quant, CSF: NONREACTIVE

## 2018-06-10 LAB — RPR: RPR Ser Ql: NONREACTIVE

## 2018-06-10 LAB — PATHOLOGIST SMEAR REVIEW

## 2018-06-10 MED ORDER — ADULT MULTIVITAMIN W/MINERALS CH
1.0000 | ORAL_TABLET | Freq: Every day | ORAL | Status: DC
  Administered 2018-06-10 – 2018-06-21 (×11): 1 via ORAL
  Filled 2018-06-10 (×12): qty 1

## 2018-06-10 NOTE — Progress Notes (Signed)
INFECTIOUS DISEASE PROGRESS NOTE  ID: Christopher Henson is a 38 y.o. male with  Active Problems:   Headache  Subjective: No complaints.   Abtx:  Anti-infectives (From admission, onward)   Start     Dose/Rate Route Frequency Ordered Stop   06/09/18 2000  azithromycin (ZITHROMAX) 500 mg in sodium chloride 0.9 % 250 mL IVPB     500 mg 250 mL/hr over 60 Minutes Intravenous Every 24 hours 06/09/18 1801 06/16/18 1959   06/09/18 1830  cefTRIAXone (ROCEPHIN) 1 g in sodium chloride 0.9 % 100 mL IVPB     1 g 200 mL/hr over 30 Minutes Intravenous Every 24 hours 06/09/18 1801 06/16/18 1829   06/08/18 1800  vancomycin (VANCOCIN) IVPB 1000 mg/200 mL premix  Status:  Discontinued     1,000 mg 200 mL/hr over 60 Minutes Intravenous Every 8 hours 06/08/18 0723 06/08/18 1021   06/08/18 1600  fluconazole (DIFLUCAN) IVPB 200 mg  Status:  Discontinued     200 mg 100 mL/hr over 60 Minutes Intravenous Daily 06/08/18 0949 06/08/18 1021   06/08/18 1200  amphotericin B liposome (AMBISOME) 290 mg in dextrose 5 % 500 mL IVPB     4 mg/kg  72.6 kg 250 mL/hr over 120 Minutes Intravenous Daily 06/08/18 0858     06/08/18 1200  flucytosine (ANCOBON) capsule 1,750 mg     25 mg/kg  72.6 kg Oral Every 6 hours 06/08/18 0858     06/08/18 0800  vancomycin (VANCOCIN) 1,500 mg in sodium chloride 0.9 % 500 mL IVPB  Status:  Discontinued     1,500 mg 250 mL/hr over 120 Minutes Intravenous  Once 06/08/18 0655 06/08/18 1021   06/08/18 0700  cefTRIAXone (ROCEPHIN) 2 g in sodium chloride 0.9 % 100 mL IVPB  Status:  Discontinued     2 g 200 mL/hr over 30 Minutes Intravenous Every 12 hours 06/08/18 0651 06/08/18 1021      Medications:  Scheduled: . dextrose  10 mL Intravenous Daily  . dextrose  10 mL Intravenous Q1200  . feeding supplement (ENSURE ENLIVE)  237 mL Oral BID BM  . flucytosine  25 mg/kg Oral Q6H  . Influenza vac split quadrivalent PF  0.5 mL Intramuscular Tomorrow-1000  . metoprolol tartrate  12.5 mg Oral  BID  . multivitamin with minerals  1 tablet Oral Daily  . nystatin  5 mL Oral QID  . sodium chloride  500 mL Intravenous Daily  . sodium chloride  500 mL Intravenous Q1200    Objective: Vital signs in last 24 hours: Temp:  [98.8 F (37.1 C)-102.7 F (39.3 C)] 99.1 F (37.3 C) (03/02 0926) Pulse Rate:  [93-120] 93 (03/02 0926) Resp:  [18-24] 20 (03/02 0926) BP: (102-138)/(66-95) 116/81 (03/02 0926) SpO2:  [85 %-99 %] 91 % (03/02 0926)   General appearance: alert, cooperative and no distress Eyes: negative findings: optic nerve appearance unremarkable and no photophobia Throat: no thrush Neck: supple, symmetrical, trachea midline and FROM Resp: clear to auscultation bilaterally Cardio: regular rate and rhythm GI: normal findings: bowel sounds normal and soft, non-tender  Lab Results Recent Labs    06/09/18 0526 06/10/18 0529  WBC 10.4 7.7  HGB 13.5 12.4*  HCT 40.9 37.8*  NA 138 138  K 3.8 4.2  CL 103 105  CO2 26 24  BUN 11 12  CREATININE 0.93 0.82   Liver Panel Recent Labs    06/07/18 2341 06/09/18 0526  PROT 7.0 6.6  ALBUMIN 3.6 3.5  AST 43*  30  ALT 60* 40  ALKPHOS 105 110  BILITOT 0.8 0.9   Sedimentation Rate No results for input(s): ESRSEDRATE in the last 72 hours. C-Reactive Protein No results for input(s): CRP in the last 72 hours.  Microbiology: Recent Results (from the past 240 hour(s))  CSF culture     Status: None (Preliminary result)   Collection Time: 06/08/18  1:57 AM  Result Value Ref Range Status   Specimen Description   Final    BACK Performed at [facility], [address], [place], [place] [address]    Special Requests   Final    Immunocompromised Performed at [facility], [address], [place], [place] [address]    Gram Stain   Final    NO WBC SEEN YEAST CRITICAL RESULT CALLED TO, READ BACK BY AND VERIFIED WITH: Kerry Kass RN, AT [phone] 06/08/18 BY Renato Shin Performed at [facility], [address] [place], [place] [address]    Culture YEAST IDENTIFICATION TO FOLLOW   Final   Report Status PENDING  Incomplete  Culture, blood (routine x 2)     Status: None (Preliminary result)   Collection Time: 06/09/18  2:36 PM  Result Value Ref Range Status   Specimen Description BLOOD LEFT ANTECUBITAL  Final   Special Requests   Final    BOTTLES DRAWN AEROBIC AND ANAEROBIC Blood Culture adequate volume   Culture NO GROWTH < 12 HOURS  Final   Report Status PENDING  Incomplete  Culture, blood (routine x 2)     Status: None (Preliminary result)   Collection Time: 06/09/18  2:39 PM  Result Value Ref Range Status   Specimen Description BLOOD LEFT ANTECUBITAL  Final   Special Requests   Final    BOTTLES DRAWN AEROBIC AND ANAEROBIC Blood Culture adequate volume   Culture NO GROWTH < 12 HOURS  Final   Report Status PENDING  Incomplete    Studies/Results: Dg Chest Port 1 View  Result Date: 06/09/2018 CLINICAL DATA:  Shortness of breath and fever. EXAM: PORTABLE CHEST 1 VIEW COMPARISON:  None. FINDINGS: The cardiac silhouette is prominent but poorly evaluated due to the portable technique. The left hilum is normal. The right hilum is somewhat prominent compared to the left. The mediastinum is otherwise unremarkable. No pneumothorax. No nodules or masses. No overt edema. No focal infiltrate. IMPRESSION: 1. The right hilum is somewhat prominent compared to the left. Recommend a PA and lateral chest x-ray for better evaluation. If the finding persists, consider CT imaging. 2. No other significant abnormalities. Electronically Signed   By: Gerome Sam III M.D   On: 06/09/2018 19:47     Assessment/Plan: AIDS Crypto Thrush SOB  Total days of antibiotics: 3 ampho/flucytosine  Will need 2 weeks of ampho/flucytosine Watch sx for consideration of repeat LP If hypoxia persists, would consider BAL He needs to be off ART for at least the first 2 weeks of crytpo rx due to risk  of IRIS.  HIV RNA and CD4 pending Spoke with pt about fears about disclosure, to partner (ASAP) and work (not necessary)          Johny Sax MD, FACP Infectious Diseases (pager) [phone] [email or web address] 06/10/2018, 11:52 AM  LOS: 2 days

## 2018-06-10 NOTE — Progress Notes (Signed)
NT reports that Pt had taken oxygen off and room air saturation was in the high 70s low 80s.  O2 replaced @ 2l [place] with o2 sats returning to 91%. RN  Discussed importance of keeping oxygen on and Pt states OK I will try".

## 2018-06-10 NOTE — Progress Notes (Signed)
Despite education regarding importance of maintaining oxygen level above 92%, patient continues to remove nasal cannula providing oxygen throughout the night. Upon assessment of vital signs, patient's oxygen saturation is in the mid 80's without supplemental oxygen. Education again given to patient regarding importance of keeping nasal cannula in place and patient voiced understanding. Christopher Henson

## 2018-06-10 NOTE — Progress Notes (Signed)
PROGRESS NOTE    Leverne Henson  LPF:[id number] DOB: 03/10/1981 DOA: 06/07/2018 PCP: Patient, No Pcp Per    Brief Narrative:  38 y.o. male with Past medical history of alcohol use, GERD. Patient presents with complaints of headache.  This is ongoing since last 1 week. Associated with nausea as well as vomiting. Patient also report some dizziness without any vertigo. Patient was seen in the ER on June 05, 2018.  At which time his symptoms were concerning for HIV with the presence of oral thrush. Patient was given nystatin and was recommended to get an HIV checkup. As well as LP. Patient left the urgent care and now returns to the hospital with again worsening headache. Reports that this is a 10 out of 10 headache, worst headache of his life throbbing in nature. Denies any nausea at the time of my evaluation.  No diarrhea. Denies having any prior diagnosis of HIV. He reports that while he was in the prison he had a tattoo as well as he reported to ID provider that he had previous male partner who are HIV positive.  He has not notified his current male partner about his diagnosis yet.  Assessment & Plan:   Active Problems:   Headache  1.  Cryptococcal meningitis. -CT scan of the head unremarkable. -LP was performed after CT scan which shows presence of opening pressure of 35 mm. -Gram stain of CSF positive for yeast, cryptococcal titers were elevated as well. -Concerns for cryptococcal meningitis and therefore patient was started on IV amphotericin B as well as Flucytosine. -Cont with repeat LP to ensure that his opening CSF pressure goes less than 120. -Continue to monitor on telemetry given the risk for deterioration. -Appreciate ID input  2.  New diagnosis of HIV. -CD4 and viral load pending -Presents with complaints of oral thrush. -Currently being treated for Crypto meningitis. -Management per ID once hemodynamically stable from cryptococcal meningitis point of  view. -Tolerating abx thus far  3.  Mild hypokalemia. Replaced Repeat bmet  4. Sepsis from possible PNA, not present on admit -New finding this admit -Noted to be febrile with HR in the 140-150's -Started on basal IVF hydration -UA reviewed, clear -Blood cx pending -CXR personally reviewed. Since pt is newly O2 dependent, have started empiric abx to cover PNA (azithro and rocephin)   DVT prophylaxis: SCD's Code Status: Full Family Communication: Pt in room Disposition Plan: Uncertain at this time  Consultants:   ID  Procedures:     Antimicrobials: Anti-infectives (From admission, onward)   Start     Dose/Rate Route Frequency Ordered Stop   06/09/18 2000  azithromycin (ZITHROMAX) 500 mg in sodium chloride 0.9 % 250 mL IVPB     500 mg 250 mL/hr over 60 Minutes Intravenous Every 24 hours 06/09/18 1801 06/16/18 1959   06/09/18 1830  cefTRIAXone (ROCEPHIN) 1 g in sodium chloride 0.9 % 100 mL IVPB     1 g 200 mL/hr over 30 Minutes Intravenous Every 24 hours 06/09/18 1801 06/16/18 1829   06/08/18 1800  vancomycin (VANCOCIN) IVPB 1000 mg/200 mL premix  Status:  Discontinued     1,000 mg 200 mL/hr over 60 Minutes Intravenous Every 8 hours 06/08/18 0723 06/08/18 1021   06/08/18 1600  fluconazole (DIFLUCAN) IVPB 200 mg  Status:  Discontinued     200 mg 100 mL/hr over 60 Minutes Intravenous Daily 06/08/18 0949 06/08/18 1021   06/08/18 1200  amphotericin B liposome (AMBISOME) 290 mg in dextrose 5 % 500  mL IVPB     4 mg/kg  72.6 kg 250 mL/hr over 120 Minutes Intravenous Daily 06/08/18 0858     06/08/18 1200  flucytosine (ANCOBON) capsule 1,750 mg     25 mg/kg  72.6 kg Oral Every 6 hours 06/08/18 0858     06/08/18 0800  vancomycin (VANCOCIN) 1,500 mg in sodium chloride 0.9 % 500 mL IVPB  Status:  Discontinued     1,500 mg 250 mL/hr over 120 Minutes Intravenous  Once 06/08/18 0655 06/08/18 1021   06/08/18 0700  cefTRIAXone (ROCEPHIN) 2 g in sodium chloride 0.9 % 100 mL IVPB   Status:  Discontinued     2 g 200 mL/hr over 30 Minutes Intravenous Every 12 hours 06/08/18 0651 06/08/18 1021      Subjective: Feeling frustrated about needing O2  Objective: Vitals:   06/10/18 0245 06/10/18 0603 06/10/18 0926 06/10/18 1310  BP: 105/66 102/71 116/81 116/90  Pulse: 100 (!) 104 93 95  Resp: 20 20 20 16   Temp: 100.1 F (37.8 C) 98.8 F (37.1 C) 99.1 F (37.3 C) 100.1 F (37.8 C)  TempSrc: Oral Oral Oral Oral  SpO2: 91% 97% 91% 95%  Weight:      Height:        Intake/Output Summary (Last 24 hours) at 06/10/2018 1416 Last data filed at 06/10/2018 1317 Gross per 24 hour  Intake 2374.39 ml  Output 2125 ml  Net 249.39 ml   Filed Weights   06/07/18 2316  Weight: 72.6 kg    Examination: General exam: Awake, laying in bed, in nad Respiratory system: Normal respiratory effort, no wheezing Cardiovascular system: regular rate, s1, s2 Gastrointestinal system: Soft, nondistended, positive BS Central nervous system: CN2-12 grossly intact, strength intact Extremities: Perfused, no clubbing Skin: Normal skin turgor, no notable skin lesions seen Psychiatry: Mood normal // no visual hallucinations   Data Reviewed: I have personally reviewed following labs and imaging studies  CBC: Recent Labs  Lab 06/05/18 0549 06/07/18 2341 06/09/18 0526 06/10/18 0529  WBC 4.5 6.6 10.4 7.7  NEUTROABS 4.0  --   --   --   HGB 12.3* 12.7* 13.5 12.4*  HCT 37.3* 38.7* 40.9 37.8*  MCV 83.8 85.4 85.0 85.1  PLT 147* 177 151 121*   Basic Metabolic Panel: Recent Labs  Lab 06/05/18 0549 06/07/18 2341 06/08/18 1037 06/09/18 0526 06/10/18 0529  NA 140 141 144 138 138  K 3.5 3.3* 3.4* 3.8 4.2  CL 101 105 107 103 105  CO2 27 28 29 26 24   GLUCOSE 111* 119* 135* 109* 97  BUN 13 18 15 11 12   CREATININE 0.80 0.90 0.91 0.93 0.82  CALCIUM 8.7* 8.9 8.6* 8.8* 8.2*  MG  --   --  2.0 1.8 2.4   GFR: Estimated Creatinine Clearance: 123 mL/min (by C-G formula based on SCr of 0.82  mg/dL). Liver Function Tests: Recent Labs  Lab 06/05/18 0549 06/07/18 2341 06/09/18 0526  AST 55* 43* 30  ALT 77* 60* 40  ALKPHOS 98 105 110  BILITOT 0.9 0.8 0.9  PROT 6.8 7.0 6.6  ALBUMIN 3.5 3.6 3.5   Recent Labs  Lab 06/07/18 2341  LIPASE 36   No results for input(s): AMMONIA in the last 168 hours. Coagulation Profile: No results for input(s): INR, PROTIME in the last 168 hours. Cardiac Enzymes: No results for input(s): CKTOTAL, CKMB, CKMBINDEX, TROPONINI in the last 168 hours. BNP (last 3 results) No results for input(s): PROBNP in the last 8760 hours. HbA1C:  No results for input(s): HGBA1C in the last 72 hours. CBG: No results for input(s): GLUCAP in the last 168 hours. Lipid Profile: No results for input(s): CHOL, HDL, LDLCALC, TRIG, CHOLHDL, LDLDIRECT in the last 72 hours. Thyroid Function Tests: No results for input(s): TSH, T4TOTAL, FREET4, T3FREE, THYROIDAB in the last 72 hours. Anemia Panel: No results for input(s): VITAMINB12, FOLATE, FERRITIN, TIBC, IRON, RETICCTPCT in the last 72 hours. Sepsis Labs: No results for input(s): PROCALCITON, LATICACIDVEN in the last 168 hours.  Recent Results (from the past 240 hour(s))  CSF culture     Status: None (Preliminary result)   Collection Time: 06/08/18  1:57 AM  Result Value Ref Range Status   Specimen Description   Final    BACK Performed at [facility], [address], [place], [place] [address]    Special Requests   Final    Immunocompromised Performed at [facility], [address] [place], [place] [address]    Gram Stain   Final    NO WBC SEEN YEAST CRITICAL RESULT CALLED TO, READ BACK BY AND VERIFIED WITH: Kerry Kass RN, AT [phone] 06/08/18 BY Renato Shin Performed at [facility], [address] [place], [place] [address]    Culture YEAST IDENTIFICATION TO FOLLOW   Final   Report Status PENDING  Incomplete  Culture, blood (routine x 2)     Status: None  (Preliminary result)   Collection Time: 06/09/18  2:36 PM  Result Value Ref Range Status   Specimen Description BLOOD LEFT ANTECUBITAL  Final   Special Requests   Final    BOTTLES DRAWN AEROBIC AND ANAEROBIC Blood Culture adequate volume   Culture NO GROWTH < 12 HOURS  Final   Report Status PENDING  Incomplete  Culture, blood (routine x 2)     Status: None (Preliminary result)   Collection Time: 06/09/18  2:39 PM  Result Value Ref Range Status   Specimen Description BLOOD LEFT ANTECUBITAL  Final   Special Requests   Final    BOTTLES DRAWN AEROBIC AND ANAEROBIC Blood Culture adequate volume   Culture NO GROWTH < 12 HOURS  Final   Report Status PENDING  Incomplete     Radiology Studies: Dg Chest Port 1 View  Result Date: 06/09/2018 CLINICAL DATA:  Shortness of breath and fever. EXAM: PORTABLE CHEST 1 VIEW COMPARISON:  None. FINDINGS: The cardiac silhouette is prominent but poorly evaluated due to the portable technique. The left hilum is normal. The right hilum is somewhat prominent compared to the left. The mediastinum is otherwise unremarkable. No pneumothorax. No nodules or masses. No overt edema. No focal infiltrate. IMPRESSION: 1. The right hilum is somewhat prominent compared to the left. Recommend a PA and lateral chest x-ray for better evaluation. If the finding persists, consider CT imaging. 2. No other significant abnormalities. Electronically Signed   By: Gerome Sam III M.D   On: 06/09/2018 19:47    Scheduled Meds: . dextrose  10 mL Intravenous Daily  . dextrose  10 mL Intravenous Q1200  . feeding supplement (ENSURE ENLIVE)  237 mL Oral BID BM  . flucytosine  25 mg/kg Oral Q6H  . Influenza vac split quadrivalent PF  0.5 mL Intramuscular Tomorrow-1000  . metoprolol tartrate  12.5 mg Oral BID  . multivitamin with minerals  1 tablet Oral Daily  . nystatin  5 mL Oral QID  . sodium chloride  500 mL Intravenous Daily  . sodium chloride  500 mL  Intravenous Q1200   Continuous  Infusions: . sodium chloride 100 mL/hr at 06/10/18 0102  . amphotericin  B  Liposome (AMBISOME) ADULT IV 290 mg (06/10/18 1047)  . azithromycin 500 mg (06/09/18 2032)  . cefTRIAXone (ROCEPHIN)  IV 1 g (06/09/18 1848)     LOS: 2 days   Rickey Barbara, MD [facility] Pager On Amion  If 7PM-7AM, please contact night-coverage 06/10/2018, 2:16 PM

## 2018-06-10 NOTE — Progress Notes (Signed)
Initial Nutrition Assessment  DOCUMENTATION CODES:   Severe malnutrition in context of acute illness/injury  INTERVENTION:  - Continue Ensure Enlive po BID, each supplement provides 350 kcal and 20 grams of protein - Will order daily 2 PM snack (cheese and crackers and hot tea), per patient request. - Will order daily multivitamin with minerals. - Continue to encourage PO intakes.    NUTRITION DIAGNOSIS:   Severe Malnutrition related to acute illness(recent dx of HIV) as evidenced by mild fat depletion, moderate fat depletion, mild muscle depletion, moderate muscle depletion.  GOAL:   Patient will meet greater than or equal to 90% of their needs  MONITOR:   PO intake, Supplement acceptance, Weight trends, Labs, I & O's  REASON FOR ASSESSMENT:   Malnutrition Screening Tool  ASSESSMENT:   38 y.o. male with past medical history of alcohol abuse and GERD. Patient presented to the ED with complaints of N/V, dizziness without vertigo, and headaches x1 week. He had been seen in the ED on 2/26 and symptoms were concerning for HIV; oral thrush also present at that time. At that time, he was given nystatin and was recommended to get an HIV checkup and lumbar puncture.  BMI indicates normal weight. Per flow sheet, patient consumed 25% of breakfast yesterday AM. No other intakes documented since admission. Patient reports that last Monday (2/24) he suddenly was unable to keep down any foods or liquids (gave examples of tea and soup). He did not throw up any times other than when he would attempt to eat or drink. He reports that with vomiting, he began to experience an increasingly worsening headache until he presented to the ED on Friday (2/28).  He denies headache, abdominal pain, N/V today. He denies any episodes of emesis since admission. H&P indicates pt was dx with thrush <1 week ago. He denies any oral or throat pain at this time or with eating or drinking. He states that he was able to  consume soup for dinner last night without issue and had cheesecake this AM without issue.   Patient reports decreased appetite over the past 1-2 months with no known reason. He states that he would still eat but that he would eat half portions or snack throughout the day rather than having meals.   Weight on admission recorded as 160 lb. Patient states UBW of 170 lb and that he last weighed this 1-2 months ago. This indicates 10 lb weight loss (6% body weight) in the past 1-2 months. He also reports that ~1 year ago he weighed 200 lb and that he has lost all weight unintentionally. This indicates 40 lb weight loss (20% body weight) in the past 1 year.   Talked with patient about the importance of protein and having protein each time he eats. He consumed 2 Ensure yesterday without issue and is okay with continuing to receive them. Also talked with him about trying to eat small, more frequent portions.    Medications reviewed; 5 ml mycostatin QID. Labs reviewed; Ca: 8.2 mg/dl.     NUTRITION - FOCUSED PHYSICAL EXAM:    Most Recent Value  Orbital Region  Moderate depletion  Upper Arm Region  Moderate depletion  Thoracic and Lumbar Region  Unable to assess  Buccal Region  Mild depletion  Temple Region  Mild depletion  Clavicle Bone Region  Moderate depletion  Clavicle and Acromion Bone Region  Moderate depletion  Scapular Bone Region  Mild depletion  Dorsal Hand  Moderate depletion  Patellar Region  Unable to assess  Anterior Thigh Region  Unable to assess  Posterior Calf Region  Unable to assess  Edema (RD Assessment)  Unable to assess  Hair  Reviewed  Eyes  Reviewed  Mouth  Reviewed  Skin  Reviewed  Nails  Reviewed       Diet Order:   Diet Order            Diet regular Room service appropriate? Yes; Fluid consistency: Thin  Diet effective now              EDUCATION NEEDS:   Education needs have been addressed  Skin:  Skin Assessment: Reviewed RN Assessment  Last  BM:  PTA/unknown  Height:   Ht Readings from Last 1 Encounters:  06/07/18 6' (1.829 m)    Weight:   Wt Readings from Last 1 Encounters:  06/07/18 72.6 kg    Ideal Body Weight:  80.91 kg  BMI:  Body mass index is 21.7 kg/m.  Estimated Nutritional Needs:   Kcal:  [id number] kcal  Protein:  100-115 grams  Fluid:  >/= 2.2 L/day     Christopher Matin, MS, RD, LDN, [facility] Inpatient Clinical Dietitian Pager # [phone] After hours/weekend pager # [phone]

## 2018-06-11 LAB — HEPATITIS C ANTIBODY: HCV Ab: <0.1 s/co ratio (ref 0.0–0.9)

## 2018-06-11 LAB — BASIC METABOLIC PANEL
Anion gap: 9 (ref 5–15)
BUN: 10 mg/dL (ref 6–20)
CO2: 23 mmol/L (ref 22–32)
Calcium: 8 mg/dL — ABNORMAL LOW (ref 8.9–10.3)
Chloride: 104 mmol/L (ref 98–111)
Creatinine, Ser: 0.71 mg/dL (ref 0.61–1.24)
GFR calc Af Amer: >60 mL/min (ref >60)
GFR calc non Af Amer: >60 mL/min (ref >60)
Glucose, Bld: 134 mg/dL — ABNORMAL HIGH (ref 70–99)
Potassium: 3.7 mmol/L (ref 3.5–5.1)
Sodium: 136 mmol/L (ref 135–145)

## 2018-06-11 LAB — CSF CULTURE W GRAM STAIN: Gram Stain: NONE SEEN

## 2018-06-11 LAB — MAGNESIUM: Magnesium: 2 mg/dL (ref 1.7–2.4)

## 2018-06-11 LAB — T-HELPER CELLS (CD4) COUNT (NOT AT ARMC)
CD4 % Helper T Cell: 2 % — ABNORMAL LOW (ref 33–55)
CD4 T Cell Abs: 10 /uL — ABNORMAL LOW (ref 400–2700)

## 2018-06-11 LAB — HEPATITIS B SURFACE ANTIGEN: Hepatitis B Surface Ag: NEGATIVE

## 2018-06-11 LAB — HEPATITIS B SURFACE ANTIBODY, QUANTITATIVE: Hep B S AB Quant (Post): <3.1 m[IU]/mL — ABNORMAL LOW (ref >9.9)

## 2018-06-11 MED ORDER — SULFAMETHOXAZOLE-TRIMETHOPRIM 800-160 MG PO TABS
2.0000 | ORAL_TABLET | Freq: Three times a day (TID) | ORAL | Status: DC
  Administered 2018-06-11 – 2018-06-21 (×30): 2 via ORAL
  Filled 2018-06-11 (×30): qty 2

## 2018-06-11 MED ORDER — POTASSIUM CHLORIDE CRYS ER 20 MEQ PO TBCR
20.0000 meq | EXTENDED_RELEASE_TABLET | Freq: Once | ORAL | Status: AC
  Administered 2018-06-11: 20 meq via ORAL
  Filled 2018-06-11: qty 1

## 2018-06-11 MED ORDER — PREDNISONE 20 MG PO TABS
20.0000 mg | ORAL_TABLET | Freq: Every day | ORAL | Status: DC
  Administered 2018-06-19 – 2018-06-21 (×3): 20 mg via ORAL
  Filled 2018-06-11 (×3): qty 1

## 2018-06-11 MED ORDER — AZITHROMYCIN 250 MG PO TABS: 500.0000 mg | ORAL_TABLET | Freq: Every day | ORAL | Status: DC

## 2018-06-11 MED ORDER — PREDNISONE 20 MG PO TABS
40.0000 mg | ORAL_TABLET | Freq: Two times a day (BID) | ORAL | Status: AC
  Administered 2018-06-11 – 2018-06-13 (×5): 40 mg via ORAL
  Filled 2018-06-11 (×6): qty 2

## 2018-06-11 MED ORDER — PREDNISONE 20 MG PO TABS
40.0000 mg | ORAL_TABLET | Freq: Every day | ORAL | Status: AC
  Administered 2018-06-14 – 2018-06-18 (×5): 40 mg via ORAL
  Filled 2018-06-11 (×5): qty 2

## 2018-06-11 NOTE — Progress Notes (Signed)
INFECTIOUS DISEASE PROGRESS NOTE  ID: Christopher Henson is a 38 y.o. 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  Continued headache.   Abtx:  Anti-infectives (From admission, onward)   Start     Dose/Rate Route Frequency Ordered Stop   06/11/18 1800  azithromycin (ZITHROMAX) tablet 500 mg     500 mg Oral Daily-1800 06/11/18 1126 06/16/18 1759   06/09/18 2000  azithromycin (ZITHROMAX) 500 mg in sodium chloride 0.9 % 250 mL IVPB  Status:  Discontinued     500 mg 250 mL/hr over 60 Minutes Intravenous Every 24 hours 06/09/18 1801 06/11/18 1126   06/09/18 1830  cefTRIAXone (ROCEPHIN) 1 g in sodium chloride 0.9 % 100 mL IVPB     1 g 200 mL/hr over 30 Minutes Intravenous Every 24 hours 06/09/18 1801 06/16/18 1829   06/08/18 1800  vancomycin (VANCOCIN) IVPB 1000 mg/200 mL premix  Status:  Discontinued     1,000 mg 200 mL/hr over 60 Minutes Intravenous Every 8 hours 06/08/18 0723 06/08/18 1021   06/08/18 1600  fluconazole (DIFLUCAN) IVPB 200 mg  Status:  Discontinued     200 mg 100 mL/hr over 60 Minutes Intravenous Daily 06/08/18 0949 06/08/18 1021   06/08/18 1200  amphotericin B liposome (AMBISOME) 290 mg in dextrose 5 % 500 mL IVPB     4 mg/kg  72.6 kg 250 mL/hr over 120 Minutes Intravenous Daily 06/08/18 0858     06/08/18 1200  flucytosine (ANCOBON) capsule 1,750 mg     25 mg/kg  72.6 kg Oral Every 6 hours 06/08/18 0858     06/08/18 0800  vancomycin (VANCOCIN) 1,500 mg in sodium chloride 0.9 % 500 mL IVPB  Status:  Discontinued     1,500 mg 250 mL/hr over 120 Minutes Intravenous  Once 06/08/18 0655 06/08/18 1021   06/08/18 0700  cefTRIAXone (ROCEPHIN) 2 g in sodium chloride 0.9 % 100 mL IVPB  Status:  Discontinued     2 g 200 mL/hr over 30 Minutes Intravenous Every 12 hours 06/08/18 0651 06/08/18 1021      Medications:  Scheduled: .  azithromycin  500 mg Oral q1800  . dextrose  10 mL Intravenous Daily  . dextrose  10 mL Intravenous Q1200  . feeding supplement (ENSURE ENLIVE)  237 mL Oral BID BM  . flucytosine  25 mg/kg Oral Q6H  . Influenza vac split quadrivalent PF  0.5 mL Intramuscular Tomorrow-1000  . metoprolol tartrate  12.5 mg Oral BID  . multivitamin with minerals  1 tablet Oral Daily  . nystatin  5 mL Oral QID  . sodium chloride  500 mL Intravenous Daily  . sodium chloride  500 mL Intravenous Q1200    Objective: Vital signs in last 24 hours: Temp:  [97.8 F (36.6 C)-99.5 F (37.5 C)] 99.5 F (37.5 C) (03/03 0539) Pulse Rate:  [81-98] 91 (03/03 0539) Resp:  [16-18] 18 (03/03 0539) BP: (94-108)/(71-79) 108/79 (03/03 0539) SpO2:  [91 %-97 %] 91 % (03/03 0539)   General appearance: alert, cooperative and no distress Resp: rhonchi bilaterally Cardio: regular rate and rhythm GI: normal findings: bowel sounds normal and soft, non-tender  Lab Results Recent Labs    06/09/18 0526 06/10/18 0529 06/11/18 0621  WBC 10.4 7.7  --   HGB 13.5 12.4*  --   HCT 40.9 37.8*  --   NA  138 138 136  K 3.8 4.2 3.7  CL 103 105 104  CO2 '26 24 23  '$ BUN '11 12 10  '$ CREATININE 0.93 0.82 0.71   Liver Panel Recent Labs    06/09/18 0526  PROT 6.6  ALBUMIN 3.5  AST 30  ALT 40  ALKPHOS 110  BILITOT 0.9   Sedimentation Rate No results for input(s): ESRSEDRATE in the last 72 hours. C-Reactive Protein No results for input(s): CRP in the last 72 hours.  Microbiology: Recent Results (from the past 240 hour(s))  CSF culture     Status: None   Collection Time: 06/08/18  1:57 AM  Result Value Ref Range Status   Specimen Description   Final    BACK Performed at [facility] [address] [place], [place] [address]    Special Requests   Final    Immunocompromised Performed at [facility], [place] [address], [place], [place] [address]    Gram Stain   Final    NO WBC  SEEN YEAST CRITICAL RESULT CALLED TO, READ BACK BY AND VERIFIED WITH: Ephriam Knuckles RN, AT [phone] 06/08/18 BY Rush Landmark Performed at [facility], [place] [address], [place], [place] [address]    Culture MODERATE CRYPTOCOCCUS NEOFORMANS  Final   Report Status 06/11/2018 FINAL  Final  Hsv Culture And Typing     Status: None   Collection Time: 06/08/18  3:23 AM  Result Value Ref Range Status   HSV Culture/Type Comment  Final    Comment: (NOTE) Negative No Herpes simplex virus isolated. Performed At: [facility] [place], [place] [id number] Rush Farmer MD [id number]    Source of Sample CSF  Final    Comment: Performed at [facility], [place] [address] [place], [place] [address]  Fungus Culture With Stain     Status: None (Preliminary result)   Collection Time: 06/08/18  3:23 AM  Result Value Ref Range Status   Fungus Stain Final report  Final    Comment: (NOTE) Performed At: [facility] [place], [place] [id number] Rush Farmer MD [id number]    Fungus (Mycology) Culture PENDING  Incomplete   Fungal Source PENDING  Incomplete  Fungus Culture Result     Status: None   Collection Time: 06/08/18  3:23 AM  Result Value Ref Range Status   Result 1 Yeast observed  Final    Comment: (NOTE) Reported to Estill Dooms. on [id number] at 0821, [place] faxed to [phone] Performed At: [facility] [address] [place], [place] [id number] Rush Farmer MD [id number]   Culture, blood (routine x 2)     Status: None (Preliminary result)   Collection Time: 06/09/18  2:36 PM  Result Value Ref Range Status   Specimen Description BLOOD LEFT ANTECUBITAL  Final   Special Requests   Final    BOTTLES DRAWN AEROBIC AND ANAEROBIC Blood Culture adequate volume   Culture   Final    NO GROWTH 2 DAYS Performed at [facility], [place] [address], [place], [place] [address]    Report Status PENDING  Incomplete  Culture,  blood (routine x 2)     Status: None (Preliminary result)   Collection Time: 06/09/18  2:39 PM  Result Value Ref Range Status   Specimen Description BLOOD LEFT ANTECUBITAL  Final   Special Requests   Final    BOTTLES DRAWN AEROBIC AND ANAEROBIC Blood Culture adequate volume   Culture  Final    NO GROWTH 2 DAYS Performed at [facility], [place] [address] [place], [place] [address]    Report Status PENDING  Incomplete    Studies/Results: Dg Chest Port 1 View  Result Date: 06/09/2018 CLINICAL DATA:  Shortness of breath and fever. EXAM: PORTABLE CHEST 1 VIEW COMPARISON:  None. FINDINGS: The cardiac silhouette is prominent but poorly evaluated due to the portable technique. The left hilum is normal. The right hilum is somewhat prominent compared to the left. The mediastinum is otherwise unremarkable. No pneumothorax. No nodules or masses. No overt edema. No focal infiltrate. IMPRESSION: 1. The right hilum is somewhat prominent compared to the left. Recommend a PA and lateral chest x-ray for better evaluation. If the finding persists, consider CT imaging. 2. No other significant abnormalities. 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 Concern for PCP Will send PCP DFA on sputum Will change ceftriaxone/azithro to bactrim-steroids.   No photophobia, FROM of neck.           Bobby Rumpf MD, FACP Infectious Diseases (pager) [phone] [email or web address] 06/11/2018, 2:48 PM  LOS: 3 days

## 2018-06-11 NOTE — Progress Notes (Signed)
PROGRESS NOTE    Christopher Henson  WUJ:[id number] DOB: 06/25/1980 DOA: 06/07/2018 PCP: Patient, No Pcp Per    Brief Narrative:  38 y.o. male with Past medical history of alcohol use, GERD. Patient presents with complaints of headache.  This is ongoing since last 1 week. Associated with nausea as well as vomiting. Patient also report some dizziness without any vertigo. Patient was seen in the ER on June 05, 2018.  At which time his symptoms were concerning for HIV with the presence of oral thrush. Patient was given nystatin and was recommended to get an HIV checkup. As well as LP. Patient left the urgent care and now returns to the hospital with again worsening headache. Reports that this is a 10 out of 10 headache, worst headache of his life throbbing in nature. Denies any nausea at the time of my evaluation.  No diarrhea. Denies having any prior diagnosis of HIV. He reports that while he was in the prison he had a tattoo as well as he reported to ID provider that he had previous male partner who are HIV positive.  He has not notified his current male partner about his diagnosis yet.  Assessment & Plan:   Active Problems:   Headache   Protein-calorie malnutrition, severe   Symptomatic HIV infection (HCC)   Thrush   Cryptococcal meningitis (H[facility])  1.  Cryptococcal meningitis. -CT scan of the head unremarkable. -LP was performed after CT scan which shows presence of opening pressure of 35 mm. -Gram stain of CSF positive for yeast, cryptococcal titers were elevated as well. -Concerns for cryptococcal meningitis and therefore patient was started on IV amphotericin B as well as Flucytosine. -Cont with repeat LP as needed per ID -ID following  2.  New diagnosis of HIV. AIDS -CD4 of 10 -Presents with complaints of oral thrush. -Currently being treated for Crypto meningitis. -Management per ID once hemodynamically stable from cryptococcal meningitis point of view. -Tolerating  abx thus far  3.  Mild hypokalemia. Replaced Recheck bmet in AM  4. Sepsis from possible PNA, not present on admit -New finding this admit -Earlier note to be febrile with HR in the 140-150's shortly after admit -Started on basal IVF hydration -UA reviewed, clear -Blood cx neg thus far -CXR personally reviewed. Since pt is newly O2 dependent, have started empiric abx to cover PNA (azithro and rocephin) -Remains O2 dependent. Fevers resolved since starting azithro and rocephin   DVT prophylaxis: SCD's Code Status: Full Family Communication: Pt in room Disposition Plan: Uncertain at this time  Consultants:   ID  Procedures:     Antimicrobials: Anti-infectives (From admission, onward)   Start     Dose/Rate Route Frequency Ordered Stop   06/11/18 1800  azithromycin (ZITHROMAX) tablet 500 mg     500 mg Oral Daily-1800 06/11/18 1126 06/16/18 1759   06/09/18 2000  azithromycin (ZITHROMAX) 500 mg in sodium chloride 0.9 % 250 mL IVPB  Status:  Discontinued     500 mg 250 mL/hr over 60 Minutes Intravenous Every 24 hours 06/09/18 1801 06/11/18 1126   06/09/18 1830  cefTRIAXone (ROCEPHIN) 1 g in sodium chloride 0.9 % 100 mL IVPB     1 g 200 mL/hr over 30 Minutes Intravenous Every 24 hours 06/09/18 1801 06/16/18 1829   06/08/18 1800  vancomycin (VANCOCIN) IVPB 1000 mg/200 mL premix  Status:  Discontinued     1,000 mg 200 mL/hr over 60 Minutes Intravenous Every 8 hours 06/08/18 0723 06/08/18 1021   06/08/18  1600  fluconazole (DIFLUCAN) IVPB 200 mg  Status:  Discontinued     200 mg 100 mL/hr over 60 Minutes Intravenous Daily 06/08/18 0949 06/08/18 1021   06/08/18 1200  amphotericin B liposome (AMBISOME) 290 mg in dextrose 5 % 500 mL IVPB     4 mg/kg  72.6 kg 250 mL/hr over 120 Minutes Intravenous Daily 06/08/18 0858     06/08/18 1200  flucytosine (ANCOBON) capsule 1,750 mg     25 mg/kg  72.6 kg Oral Every 6 hours 06/08/18 0858     06/08/18 0800  vancomycin (VANCOCIN) 1,500 mg  in sodium chloride 0.9 % 500 mL IVPB  Status:  Discontinued     1,500 mg 250 mL/hr over 120 Minutes Intravenous  Once 06/08/18 0655 06/08/18 1021   06/08/18 0700  cefTRIAXone (ROCEPHIN) 2 g in sodium chloride 0.9 % 100 mL IVPB  Status:  Discontinued     2 g 200 mL/hr over 30 Minutes Intravenous Every 12 hours 06/08/18 0651 06/08/18 1021      Subjective: Feeling overwhelmed about recent hiv diagnosis  Objective: Vitals:   06/10/18 1310 06/10/18 1715 06/10/18 2052 06/11/18 0539  BP: 116/90 94/71 106/77 108/79  Pulse: 95 81 98 91  Resp: 16 16 18 18   Temp: 100.1 F (37.8 C) 97.8 F (36.6 C) 98.8 F (37.1 C) 99.5 F (37.5 C)  TempSrc: Oral Oral Oral Oral  SpO2: 95% 94% 97% 91%  Weight:      Height:        Intake/Output Summary (Last 24 hours) at 06/11/2018 1351 Last data filed at 06/11/2018 1336 Gross per 24 hour  Intake 4215 ml  Output 4925 ml  Net -710 ml   Filed Weights   06/07/18 2316  Weight: 72.6 kg    Examination: General exam: Conversant, in no acute distress Respiratory system: normal chest rise, clear, no audible wheezing Cardiovascular system: regular rhythm, s1-s2 Gastrointestinal system: Nondistended, nontender, pos BS Central nervous system: No seizures, no tremors Extremities: No cyanosis, no joint deformities Skin: No rashes, no pallor Psychiatry: Affect normal // no auditory hallucinations   Data Reviewed: I have personally reviewed following labs and imaging studies  CBC: Recent Labs  Lab 06/05/18 0549 06/07/18 2341 06/09/18 0526 06/10/18 0529  WBC 4.5 6.6 10.4 7.7  NEUTROABS 4.0  --   --   --   HGB 12.3* 12.7* 13.5 12.4*  HCT 37.3* 38.7* 40.9 37.8*  MCV 83.8 85.4 85.0 85.1  PLT 147* 177 151 121*   Basic Metabolic Panel: Recent Labs  Lab 06/07/18 2341 06/08/18 1037 06/09/18 0526 06/10/18 0529 06/11/18 0621  NA 141 144 138 138 136  K 3.3* 3.4* 3.8 4.2 3.7  CL 105 107 103 105 104  CO2 28 29 26 24 23   GLUCOSE 119* 135* 109* 97 134*    BUN 18 15 11 12 10   CREATININE 0.90 0.91 0.93 0.82 0.71  CALCIUM 8.9 8.6* 8.8* 8.2* 8.0*  MG  --  2.0 1.8 2.4 2.0   GFR: Estimated Creatinine Clearance: 126 mL/min (by C-G formula based on SCr of 0.71 mg/dL). Liver Function Tests: Recent Labs  Lab 06/05/18 0549 06/07/18 2341 06/09/18 0526  AST 55* 43* 30  ALT 77* 60* 40  ALKPHOS 98 105 110  BILITOT 0.9 0.8 0.9  PROT 6.8 7.0 6.6  ALBUMIN 3.5 3.6 3.5   Recent Labs  Lab 06/07/18 2341  LIPASE 36   No results for input(s): AMMONIA in the last 168 hours. Coagulation Profile: No  results for input(s): INR, PROTIME in the last 168 hours. Cardiac Enzymes: No results for input(s): CKTOTAL, CKMB, CKMBINDEX, TROPONINI in the last 168 hours. BNP (last 3 results) No results for input(s): PROBNP in the last 8760 hours. HbA1C: No results for input(s): HGBA1C in the last 72 hours. CBG: No results for input(s): GLUCAP in the last 168 hours. Lipid Profile: Recent Labs    06/10/18 1737  CHOL 131  HDL 19*  LDLCALC 73  TRIG 161*  CHOLHDL 6.9   Thyroid Function Tests: No results for input(s): TSH, T4TOTAL, FREET4, T3FREE, THYROIDAB in the last 72 hours. Anemia Panel: No results for input(s): VITAMINB12, FOLATE, FERRITIN, TIBC, IRON, RETICCTPCT in the last 72 hours. Sepsis Labs: No results for input(s): PROCALCITON, LATICACIDVEN in the last 168 hours.  Recent Results (from the past 240 hour(s))  CSF culture     Status: None   Collection Time: 06/08/18  1:57 AM  Result Value Ref Range Status   Specimen Description   Final    BACK Performed at [facility], [address], [place], [place] [address]    Special Requests   Final    Immunocompromised Performed at [facility], [address], [place], [place] [address]    Gram Stain   Final    NO WBC SEEN YEAST CRITICAL RESULT CALLED TO, READ BACK BY AND VERIFIED WITH: Kerry Kass RN, AT [phone] 06/08/18 BY Renato Shin Performed at [facility], [address] [place], [place] [address]    Culture MODERATE CRYPTOCOCCUS NEOFORMANS  Final   Report Status 06/11/2018 FINAL  Final  Hsv Culture And Typing     Status: None   Collection Time: 06/08/18  3:23 AM  Result Value Ref Range Status   HSV Culture/Type Comment  Final    Comment: (NOTE) Negative No Herpes simplex virus isolated. Performed At: [facility] [address] [place], [place] [id number] Jolene Schimke MD [id number]    Source of Sample CSF  Final    Comment: Performed at [facility], [address] [place], [place] [address]  Fungus Culture With Stain     Status: None (Preliminary result)   Collection Time: 06/08/18  3:23 AM  Result Value Ref Range Status   Fungus Stain Final report  Final    Comment: (NOTE) Performed At: [facility] [address] [place] [address] [place], [place] [id number] Jolene Schimke MD [id number]    Fungus (Mycology) Culture PENDING  Incomplete   Fungal Source PENDING  Incomplete  Fungus Culture Result     Status: None   Collection Time: 06/08/18  3:23 AM  Result Value Ref Range Status   Result 1 Yeast observed  Final    Comment: (NOTE) Reported to Kara Dies. on [id number] at 0821, [place] faxed to [phone] Performed At: [facility] [address] [place], [place] [id number] Jolene Schimke MD [id number]:[id number]   Culture, blood (routine x 2)     Status: None (Preliminary result)   Collection Time: 06/09/18  2:36 PM  Result Value Ref Range Status   Specimen Description BLOOD LEFT ANTECUBITAL  Final   Special Requests   Final    BOTTLES DRAWN AEROBIC AND ANAEROBIC Blood Culture adequate volume   Culture   Final    NO GROWTH < 24 HOURS Performed at [facility], [address] [place], [place] [address]    Report Status PENDING  Incomplete  Culture, blood (routine x 2)  Status: None (Preliminary result)   Collection Time: 06/09/18  2:39 PM  Result Value Ref Range Status   Specimen  Description BLOOD LEFT ANTECUBITAL  Final   Special Requests   Final    BOTTLES DRAWN AEROBIC AND ANAEROBIC Blood Culture adequate volume   Culture   Final    NO GROWTH < 24 HOURS Performed at [facility], [address] [place], [place] [address]    Report Status PENDING  Incomplete     Radiology Studies: Dg Chest Port 1 View  Result Date: 06/09/2018 CLINICAL DATA:  Shortness of breath and fever. EXAM: PORTABLE CHEST 1 VIEW COMPARISON:  None. FINDINGS: The cardiac silhouette is prominent but poorly evaluated due to the portable technique. The left hilum is normal. The right hilum is somewhat prominent compared to the left. The mediastinum is otherwise unremarkable. No pneumothorax. No nodules or masses. No overt edema. No focal infiltrate. IMPRESSION: 1. The right hilum is somewhat prominent compared to the left. Recommend a PA and lateral chest x-ray for better evaluation. If the finding persists, consider CT imaging. 2. No other significant abnormalities. Electronically Signed   By: Gerome Sam III M.D   On: 06/09/2018 19:47    Scheduled Meds: . azithromycin  500 mg Oral q1800  . dextrose  10 mL Intravenous Daily  . dextrose  10 mL Intravenous Q1200  . feeding supplement (ENSURE ENLIVE)  237 mL Oral BID BM  . flucytosine  25 mg/kg Oral Q6H  . Influenza vac split quadrivalent PF  0.5 mL Intramuscular Tomorrow-1000  . metoprolol tartrate  12.5 mg Oral BID  . multivitamin with minerals  1 tablet Oral Daily  . nystatin  5 mL Oral QID  . sodium chloride  500 mL Intravenous Daily  . sodium chloride  500 mL Intravenous Q1200   Continuous Infusions: . sodium chloride 100 mL/hr at 06/11/18 0545  . amphotericin  B  Liposome (AMBISOME) ADULT IV 290 mg (06/11/18 1056)  . cefTRIAXone (ROCEPHIN)  IV 1 g (06/10/18 1738)     LOS: 3 days   Rickey Barbara, MD [facility] Pager On Amion  If 7PM-7AM, please contact night-coverage 06/11/2018, 1:51 PM

## 2018-06-11 NOTE — Progress Notes (Signed)
PHARMACIST - PHYSICIAN COMMUNICATION  CONCERNING: Antibiotic IV to Oral Route Change Policy  RECOMMENDATION: This patient is receiving azithromycin by the intravenous route.  Based on criteria approved by the Pharmacy and Therapeutics Committee, the antibiotic(s) is/are being converted to the equivalent oral dose form(s).   DESCRIPTION: These criteria include:  Patient being treated for a respiratory tract infection, urinary tract infection, cellulitis or clostridium difficile associated diarrhea if on metronidazole  The patient is not neutropenic and does not exhibit a GI malabsorption state  The patient is eating (either orally or via tube) and/or has been taking other orally administered medications for a least 24 hours  The patient is improving clinically and has a Tmax < 100.5  If you have questions about this conversion, please contact the Pharmacy Department  []   [phone] )  [facility] []   [phone] )  [facility] []   [phone] )  [facility] []   [phone] )  [facility] [x]   [phone] )  [facility]  Herby Abraham, [place].D [phone] 06/11/2018 11:27 AM

## 2018-06-12 DIAGNOSIS — Z21 Asymptomatic human immunodeficiency virus [HIV] infection status: Secondary | ICD-10-CM

## 2018-06-12 DIAGNOSIS — E43 Unspecified severe protein-calorie malnutrition: Secondary | ICD-10-CM

## 2018-06-12 DIAGNOSIS — B451 Cerebral cryptococcosis: Secondary | ICD-10-CM

## 2018-06-12 LAB — BASIC METABOLIC PANEL
Anion gap: 8 (ref 5–15)
BUN: 11 mg/dL (ref 6–20)
CO2: 23 mmol/L (ref 22–32)
Calcium: 8.6 mg/dL — ABNORMAL LOW (ref 8.9–10.3)
Chloride: 106 mmol/L (ref 98–111)
Creatinine, Ser: 0.76 mg/dL (ref 0.61–1.24)
GFR calc Af Amer: >60 mL/min (ref >60)
GFR calc non Af Amer: >60 mL/min (ref >60)
Glucose, Bld: 124 mg/dL — ABNORMAL HIGH (ref 70–99)
Potassium: 4.5 mmol/L (ref 3.5–5.1)
Sodium: 137 mmol/L (ref 135–145)

## 2018-06-12 LAB — CBC
HCT: 37 % — ABNORMAL LOW (ref 39.0–52.0)
Hemoglobin: 12 g/dL — ABNORMAL LOW (ref 13.0–17.0)
MCH: 27.8 pg (ref 26.0–34.0)
MCHC: 32.4 g/dL (ref 30.0–36.0)
MCV: 85.8 fL (ref 80.0–100.0)
Platelets: 133 10*3/uL — ABNORMAL LOW (ref 150–400)
RBC: 4.31 MIL/uL (ref 4.22–5.81)
RDW: 14.9 % (ref 11.5–15.5)
WBC: 3.5 10*3/uL — ABNORMAL LOW (ref 4.0–10.5)
nRBC: 2.3 % — ABNORMAL HIGH (ref 0.0–0.2)

## 2018-06-12 LAB — MAGNESIUM: Magnesium: 2.1 mg/dL (ref 1.7–2.4)

## 2018-06-12 MED ORDER — DEXTROSE 5% FOR FLUSHING BEFORE AND AFTER AMBISOME
10.0000 mL | INTRAVENOUS | Status: DC
  Administered 2018-06-12: 10 mL via INTRAVENOUS
  Filled 2018-06-12 (×2): qty 50
  Filled 2018-06-12: qty 10

## 2018-06-12 MED ORDER — DEXTROSE 5% FOR FLUSHING BEFORE AND AFTER AMBISOME
10.0000 mL | INTRAVENOUS | Status: DC
  Filled 2018-06-12 (×3): qty 50

## 2018-06-12 NOTE — Progress Notes (Signed)
PROGRESS NOTE    Christopher Henson  [id number] DOB: 1981-03-16 DOA: 06/07/2018 PCP: Patient, No Pcp Per   Brief Narrative:   38 yo male with pmhx of GERD came to the hospital with complaints of headache and oral thrush.  Patient was diagnosed with HIV and works started on treatment with nystatin.  Lumbar puncture showed elevated opening pressure.  CSF Gram stain was positive for yeast and cryptococcal titers were elevated.  Infectious disease were consulted who started patient on IV amphotericin B and flucytosine.  Eventually patient was also started on treatment for PCP pneumonia.  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  Gram stain for CSF was positive for yeast, cryptococcal titers elevated -Fungal culture positive for cryptococcus neoformans - Continue IV amphotericin B, flucytosine - Continue Bactrim and steroid -Infectious disease following - UA is clear.  Blood cultures thus far is negative -Supportive care. -Hepatitis B and hepatitis C-negative, RPR-nonreactive  Moderate protein calorie malnutrition -Supplements have been ordered.  DVT prophylaxis: SCDs Code Status: Full code Family Communication: None at bedside Disposition Plan: Discharge when cleared by infectious disease  Consultants:   Infectious disease  Subjective: Feels very weak but denies any new complaints.  Review of Systems Otherwise negative except as per HPI, including: General: Denies fever, chills, night sweats or unintended weight loss. Resp: Denies cough, wheezing, shortness of breath. Cardiac: Denies chest pain, palpitations, orthopnea, paroxysmal nocturnal dyspnea. GI: Denies abdominal pain,  nausea, vomiting, diarrhea or constipation GU: Denies dysuria, frequency, hesitancy or incontinence MS: Denies muscle aches, joint pain or swelling Neuro: Denies headache, neurologic deficits (focal weakness, numbness, tingling), abnormal gait Psych: Denies anxiety, depression, SI/HI/AVH Skin: Denies new rashes or lesions ID: Denies sick contacts, exotic exposures, travel  Objective: Vitals:   06/12/18 0444 06/12/18 0837 06/12/18 1015 06/12/18 1225  BP: 96/73  106/69 108/68  Pulse: 79 79 79 75  Resp: 18     Temp: 98.1 F (36.7 C)   98.1 F (36.7 C)  TempSrc: Oral   Oral  SpO2: (!) 86% 100%  99%  Weight:      Height:        Intake/Output Summary (Last 24 hours) at 06/12/2018 1346 Last data filed at 06/12/2018 1011 Gross per 24 hour  Intake 7130.4 ml  Output 3275 ml  Net 3855.4 ml   Filed Weights   06/07/18 2316  Weight: 72.6 kg    Examination:  General exam: Appears calm and comfortable, bilateral temporal wasting Respiratory system: Clear to auscultation. Respiratory effort normal. Cardiovascular system: S1 & S2 heard, RRR. No JVD, murmurs, rubs, gallops or clicks. No pedal edema. Gastrointestinal system: Abdomen is nondistended, soft and nontender. No organomegaly or masses felt. Normal bowel sounds heard. Central nervous system: Alert and oriented. No focal neurological deficits. Extremities: Symmetric 4+ x 5 power. Skin: No rashes, lesions or ulcers Psychiatry: Judgement and insight appear normal. Mood & affect appropriate.     Data Reviewed:   CBC: Recent Labs  Lab 06/07/18 2341 06/09/18 0526 06/10/18 0529 06/12/18 0554  WBC 6.6 10.4 7.7 3.5*  HGB 12.7* 13.5 12.4* 12.0*  HCT 38.7* 40.9 37.8* 37.0*  MCV 85.4 85.0 85.1 85.8  PLT 177 151 121* 133*   Basic  Metabolic Panel: Recent Labs  Lab 06/08/18 1037 06/09/18 0526 06/10/18 0529 06/11/18 0621 06/12/18 0554  NA 144 138 138 136 137  K 3.4* 3.8 4.2 3.7 4.5  CL 107 103 105 104 106  CO2 '29 26 24 23  23  '$ GLUCOSE 135* 109* 97 134* 124*  BUN '15 11 12 10 11  '$ CREATININE 0.91 0.93 0.82 0.71 0.76  CALCIUM 8.6* 8.8* 8.2* 8.0* 8.6*  MG 2.0 1.8 2.4 2.0 2.1   GFR: Estimated Creatinine Clearance: 126 mL/min (by C-G formula based on SCr of 0.76 mg/dL). Liver Function Tests: Recent Labs  Lab 06/07/18 2341 06/09/18 0526  AST 43* 30  ALT 60* 40  ALKPHOS 105 110  BILITOT 0.8 0.9  PROT 7.0 6.6  ALBUMIN 3.6 3.5   Recent Labs  Lab 06/07/18 2341  LIPASE 36   No results for input(s): AMMONIA in the last 168 hours. Coagulation Profile: No results for input(s): INR, PROTIME in the last 168 hours. Cardiac Enzymes: No results for input(s): CKTOTAL, CKMB, CKMBINDEX, TROPONINI in the last 168 hours. BNP (last 3 results) No results for input(s): PROBNP in the last 8760 hours. HbA1C: No results for input(s): HGBA1C in the last 72 hours. CBG: No results for input(s): GLUCAP in the last 168 hours. Lipid Profile: Recent Labs    06/10/18 1737  CHOL 131  HDL 19*  LDLCALC 73  TRIG 195*  CHOLHDL 6.9   Thyroid Function Tests: No results for input(s): TSH, T4TOTAL, FREET4, T3FREE, THYROIDAB in the last 72 hours. Anemia Panel: No results for input(s): VITAMINB12, FOLATE, FERRITIN, TIBC, IRON, RETICCTPCT in the last 72 hours. Sepsis Labs: No results for input(s): PROCALCITON, LATICACIDVEN in the last 168 hours.  Recent Results (from the past 240 hour(s))  CSF culture     Status: None   Collection Time: 06/08/18  1:57 AM  Result Value Ref Range Status   Specimen Description   Final    BACK Performed at [facility] [address], [place], [place] [address]    Special Requests   Final    Immunocompromised Performed at [facility], [place] [address] [place], [place] [address]    Gram Stain   Final    NO WBC SEEN YEAST CRITICAL RESULT CALLED TO, READ BACK BY AND VERIFIED WITH: Ephriam Knuckles RN, AT [phone] 06/08/18 BY Rush Landmark Performed at [facility], [place] [address], [place], [place] [address]    Culture MODERATE CRYPTOCOCCUS NEOFORMANS  Final   Report Status 06/11/2018 FINAL  Final  Hsv Culture And Typing     Status: None   Collection Time: 06/08/18  3:23 AM  Result Value Ref Range Status   HSV Culture/Type Comment  Final    Comment: (NOTE) Negative No Herpes simplex virus isolated. Performed At: [facility] [place], [place] [id number] Rush Farmer MD [id number]    Source of Sample CSF  Final    Comment: Performed at [facility], [place] [address] [place], [place] [address]  Fungus Culture With Stain     Status: None (Preliminary result)   Collection Time: 06/08/18  3:23 AM  Result Value Ref Range Status   Fungus Stain Final report  Final   Fungus (Mycology) Culture Preliminary report  Final    Comment: (NOTE) Performed At: [facility] [place], [place] [id number] Rush Farmer MD [id number]    Fungal Source PENDING  Incomplete  Fungus Culture Result  Status: None   Collection Time: 06/08/18  3:23 AM  Result Value Ref Range Status   Result 1 Yeast observed  Final    Comment: (NOTE) Performed At: [facility] [place], [place] [id number] Rush Farmer MD [id number]   Fungal organism reflex     Status: Abnormal   Collection Time: 06/08/18  3:23 AM  Result Value Ref Range Status   Fungal result 1 Comment (A)  Final    Comment: (NOTE) Cryptococcus neoformans Heavy growth Reported to [facility]. on [id number] at 0821, [place] faxed to [phone] Reported preliminary positive fungus culture report to Raelyn Ensign on [id number] at 0759, kmp faxed to [phone] Reported yeast identification to Marella Bile on [id number] at 1300, faxed to [phone]   kmp Performed At: [facility] [place], [place] [id number] Rush Farmer MD [id number]   Culture, blood (routine x 2)     Status: None  (Preliminary result)   Collection Time: 06/09/18  2:36 PM  Result Value Ref Range Status   Specimen Description BLOOD LEFT ANTECUBITAL  Final   Special Requests   Final    BOTTLES DRAWN AEROBIC AND ANAEROBIC Blood Culture adequate volume   Culture   Final    NO GROWTH 3 DAYS Performed at [facility], [place] [address], [place], [place] [address]    Report Status PENDING  Incomplete  Culture, blood (routine x 2)     Status: None (Preliminary result)   Collection Time: 06/09/18  2:39 PM  Result Value Ref Range Status   Specimen Description BLOOD LEFT ANTECUBITAL  Final   Special Requests   Final    BOTTLES DRAWN AEROBIC AND ANAEROBIC Blood Culture adequate volume   Culture   Final    NO GROWTH 3 DAYS Performed at [facility], [place] [address] [place], [place] [address]    Report Status PENDING  Incomplete         Radiology Studies: No results found.      Scheduled Meds: . dextrose  10 mL Intravenous Q24H  . dextrose  10 mL Intravenous Q24H  . feeding supplement (ENSURE ENLIVE)  237 mL Oral BID BM  . flucytosine  25 mg/kg Oral Q6H  . Influenza vac split quadrivalent PF  0.5 mL Intramuscular Tomorrow-1000  . metoprolol tartrate  12.5 mg Oral BID  . multivitamin with minerals  1 tablet Oral Daily  . nystatin  5 mL Oral QID  . predniSONE  40 mg Oral BID   Followed by  . [START ON 06/14/2018] predniSONE  40 mg Oral Q breakfast   Followed by  . [START ON 06/19/2018] predniSONE  20 mg Oral Q breakfast  . sodium chloride  500 mL Intravenous Daily  . sodium chloride  500 mL Intravenous Q1200  . sulfamethoxazole-trimethoprim  2 tablet Oral Q8H   Continuous Infusions: . sodium chloride 100 mL/hr at 06/12/18 0313  . amphotericin  B  Liposome (AMBISOME) ADULT IV 290 mg (06/12/18 1120)     LOS: 4 days   Time spent= 35 mins    Shaquela Weichert Arsenio Loader, MD [facility]  If 7PM-7AM, please contact night-coverage [email or web address] 06/12/2018, 1:46 PM

## 2018-06-12 NOTE — Progress Notes (Signed)
INFECTIOUS DISEASE PROGRESS NOTE  ID: Christopher Henson is a 38 y.o. male with  Active Problems:   Headache   Protein-calorie malnutrition, severe   Symptomatic HIV infection (HCC)   Thrush   Cryptococcal meningitis (HCC)  Subjective: C/o back pain No headache Denies cough, sob  Abtx:  Anti-infectives (From admission, onward)   Start     Dose/Rate Route Frequency Ordered Stop   06/11/18 1800  azithromycin (ZITHROMAX) tablet 500 mg  Status:  Discontinued     500 mg Oral Daily-1800 06/11/18 1126 06/11/18 1538   06/11/18 1700  sulfamethoxazole-trimethoprim (BACTRIM DS,SEPTRA DS) 800-160 MG per tablet 2 tablet     2 tablet Oral Every 8 hours 06/11/18 1538 07/02/18 1359   06/09/18 2000  azithromycin (ZITHROMAX) 500 mg in sodium chloride 0.9 % 250 mL IVPB  Status:  Discontinued     500 mg 250 mL/hr over 60 Minutes Intravenous Every 24 hours 06/09/18 1801 06/11/18 1126   06/09/18 1830  cefTRIAXone (ROCEPHIN) 1 g in sodium chloride 0.9 % 100 mL IVPB  Status:  Discontinued     1 g 200 mL/hr over 30 Minutes Intravenous Every 24 hours 06/09/18 1801 06/11/18 1538   06/08/18 1800  vancomycin (VANCOCIN) IVPB 1000 mg/200 mL premix  Status:  Discontinued     1,000 mg 200 mL/hr over 60 Minutes Intravenous Every 8 hours 06/08/18 0723 06/08/18 1021   06/08/18 1600  fluconazole (DIFLUCAN) IVPB 200 mg  Status:  Discontinued     200 mg 100 mL/hr over 60 Minutes Intravenous Daily 06/08/18 0949 06/08/18 1021   06/08/18 1200  amphotericin B liposome (AMBISOME) 290 mg in dextrose 5 % 500 mL IVPB     4 mg/kg  72.6 kg 250 mL/hr over 120 Minutes Intravenous Daily 06/08/18 0858     06/08/18 1200  flucytosine (ANCOBON) capsule 1,750 mg     25 mg/kg  72.6 kg Oral Every 6 hours 06/08/18 0858     06/08/18 0800  vancomycin (VANCOCIN) 1,500 mg in sodium chloride 0.9 % 500 mL IVPB  Status:  Discontinued     1,500 mg 250 mL/hr over 120 Minutes Intravenous  Once 06/08/18 0655 06/08/18 1021   06/08/18 0700   cefTRIAXone (ROCEPHIN) 2 g in sodium chloride 0.9 % 100 mL IVPB  Status:  Discontinued     2 g 200 mL/hr over 30 Minutes Intravenous Every 12 hours 06/08/18 0651 06/08/18 1021      Medications:  Scheduled: . dextrose  10 mL Intravenous Q24H  . dextrose  10 mL Intravenous Q24H  . feeding supplement (ENSURE ENLIVE)  237 mL Oral BID BM  . flucytosine  25 mg/kg Oral Q6H  . Influenza vac split quadrivalent PF  0.5 mL Intramuscular Tomorrow-1000  . metoprolol tartrate  12.5 mg Oral BID  . multivitamin with minerals  1 tablet Oral Daily  . nystatin  5 mL Oral QID  . predniSONE  40 mg Oral BID   Followed by  . [START ON 06/14/2018] predniSONE  40 mg Oral Q breakfast   Followed by  . [START ON 06/19/2018] predniSONE  20 mg Oral Q breakfast  . sodium chloride  500 mL Intravenous Daily  . sodium chloride  500 mL Intravenous Q1200  . sulfamethoxazole-trimethoprim  2 tablet Oral Q8H    Objective: Vital signs in last 24 hours: Temp:  [98.1 F (36.7 C)-99.8 F (37.7 C)] 98.1 F (36.7 C) (03/04 1225) Pulse Rate:  [75-89] 75 (03/04 1225) Resp:  [18-22] 18 (03/04  0444) BP: (96-121)/(68-81) 108/68 (03/04 1225) SpO2:  [86 %-100 %] 99 % (03/04 1225)   General appearance: alert, cooperative and no distress Resp: diminished breath sounds posterior - right Cardio: regular rate and rhythm GI: normal findings: bowel sounds normal and soft, non-tender  Lab Results Recent Labs    06/10/18 0529 06/11/18 0621 06/12/18 0554  WBC 7.7  --  3.5*  HGB 12.4*  --  12.0*  HCT 37.8*  --  37.0*  NA 138 136 137  K 4.2 3.7 4.5  CL 105 104 106  CO2 24 23 23   BUN 12 10 11   CREATININE 0.82 0.71 0.76   Liver Panel No results for input(s): PROT, ALBUMIN, AST, ALT, ALKPHOS, BILITOT, BILIDIR, IBILI in the last 72 hours. Sedimentation Rate No results for input(s): ESRSEDRATE in the last 72 hours. C-Reactive Protein No results for input(s): CRP in the last 72 hours.  Microbiology: Recent Results (from  the past 240 hour(s))  CSF culture     Status: None   Collection Time: 06/08/18  1:57 AM  Result Value Ref Range Status   Specimen Description   Final    BACK Performed at [facility], [address], [place], [place] [address]    Special Requests   Final    Immunocompromised Performed at [facility], [address] [place], [place] [address]    Gram Stain   Final    NO WBC SEEN YEAST CRITICAL RESULT CALLED TO, READ BACK BY AND VERIFIED WITH: Christopher Kass RN, AT [phone] 06/08/18 BY Renato Shin Performed at [facility], [address] [place], [place] [address]    Culture MODERATE CRYPTOCOCCUS NEOFORMANS  Final   Report Status 06/11/2018 FINAL  Final  Hsv Culture And Typing     Status: None   Collection Time: 06/08/18  3:23 AM  Result Value Ref Range Status   HSV Culture/Type Comment  Final    Comment: (NOTE) Negative No Herpes simplex virus isolated. Performed At: [facility] [address] [place], [place] [id number] Jolene Schimke MD [id number]    Source of Sample CSF  Final    Comment: Performed at [facility], [address], [place], [place] [address]  Fungus Culture With Stain     Status: None (Preliminary result)   Collection Time: 06/08/18  3:23 AM  Result Value Ref Range Status   Fungus Stain Final report  Final   Fungus (Mycology) Culture Preliminary report  Final    Comment: (NOTE) Performed At: [facility] [address] [place], [place] [id number] Jolene Schimke MD [id number]    Fungal Source PENDING  Incomplete  Fungus Culture Result     Status: None   Collection Time: 06/08/18  3:23 AM  Result Value Ref Range Status   Result 1 Yeast observed  Final    Comment: (NOTE) Performed At: [facility] [address] [place], [place] [id number] Jolene Schimke MD [id number]   Fungal organism reflex     Status: Abnormal   Collection Time: 06/08/18  3:23 AM  Result  Value Ref Range Status   Fungal result 1 Comment (A)  Final    Comment: (NOTE) Cryptococcus neoformans Heavy growth Reported to [facility]. on [id number] at 0821, [place] faxed to [phone] Reported preliminary positive fungus culture report to Patrina Levering on [id number] at 0759, kmp faxed to [phone] Reported yeast identification to Campbell Lerner on [id number] at 1300, faxed to [phone]   kmp Performed At:  [facility] [address] [place], [place] [id number] Jolene Schimke MD [id number]   Culture, blood (routine x 2)     Status: None (Preliminary result)   Collection Time: 06/09/18  2:36 PM  Result Value Ref Range Status   Specimen Description BLOOD LEFT ANTECUBITAL  Final   Special Requests   Final    BOTTLES DRAWN AEROBIC AND ANAEROBIC Blood Culture adequate volume   Culture   Final    NO GROWTH 3 DAYS Performed at [facility], [address] [place], [place] [address]    Report Status PENDING  Incomplete  Culture, blood (routine x 2)     Status: None (Preliminary result)   Collection Time: 06/09/18  2:39 PM  Result Value Ref Range Status   Specimen Description BLOOD LEFT ANTECUBITAL  Final   Special Requests   Final    BOTTLES DRAWN AEROBIC AND ANAEROBIC Blood Culture adequate volume   Culture   Final    NO GROWTH 3 DAYS Performed at [facility], [address], [place], [place] [address]    Report Status PENDING  Incomplete    Studies/Results: No results found.   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  Explained to pt his length of therapy, need to stay for 2 weeks of ampho Will then most likely start ART/biktarvy My great appreciation to Dr Shanda Bumps outstanding care.          Johny Sax MD, FACP Infectious Diseases (pager) [phone] [email or web address] 06/12/2018, 4:09 PM  LOS: 4 days

## 2018-06-13 DIAGNOSIS — R51 Headache: Secondary | ICD-10-CM

## 2018-06-13 LAB — GC/CHLAMYDIA PROBE AMP (~~LOC~~) NOT AT ARMC
Chlamydia: NEGATIVE
Neisseria Gonorrhea: NEGATIVE

## 2018-06-13 LAB — ARBOVIRUS IGG, CSF
California Enceph IgG: <1:1 {titer}
Eastern eq encephalitis, IgG: <1:1 {titer}
St Louis encephalitis, IgG: <1:1 {titer}
West Nile IgG CSF: 0.14 IV (ref <1.30)
Western eq encephalitis, IgG: <1:1 {titer}

## 2018-06-13 LAB — CBC
HCT: 35.1 % — ABNORMAL LOW (ref 39.0–52.0)
Hemoglobin: 11.8 g/dL — ABNORMAL LOW (ref 13.0–17.0)
MCH: 28 pg (ref 26.0–34.0)
MCHC: 33.6 g/dL (ref 30.0–36.0)
MCV: 83.4 fL (ref 80.0–100.0)
Platelets: 125 10*3/uL — ABNORMAL LOW (ref 150–400)
RBC: 4.21 MIL/uL — ABNORMAL LOW (ref 4.22–5.81)
RDW: 14.6 % (ref 11.5–15.5)
WBC: 4.4 10*3/uL (ref 4.0–10.5)
nRBC: 2 % — ABNORMAL HIGH (ref 0.0–0.2)

## 2018-06-13 LAB — COMPREHENSIVE METABOLIC PANEL
ALT: 31 U/L (ref 0–44)
AST: 23 U/L (ref 15–41)
Albumin: 3.1 g/dL — ABNORMAL LOW (ref 3.5–5.0)
Alkaline Phosphatase: 90 U/L (ref 38–126)
Anion gap: 7 (ref 5–15)
BUN: 17 mg/dL (ref 6–20)
CO2: 23 mmol/L (ref 22–32)
Calcium: 9.1 mg/dL (ref 8.9–10.3)
Chloride: 108 mmol/L (ref 98–111)
Creatinine, Ser: 0.87 mg/dL (ref 0.61–1.24)
GFR calc Af Amer: >60 mL/min (ref >60)
GFR calc non Af Amer: >60 mL/min (ref >60)
Glucose, Bld: 178 mg/dL — ABNORMAL HIGH (ref 70–99)
Potassium: 4.6 mmol/L (ref 3.5–5.1)
Sodium: 138 mmol/L (ref 135–145)
Total Bilirubin: 0.5 mg/dL (ref 0.3–1.2)
Total Protein: 6.1 g/dL — ABNORMAL LOW (ref 6.5–8.1)

## 2018-06-13 LAB — MAGNESIUM: Magnesium: 2.1 mg/dL (ref 1.7–2.4)

## 2018-06-13 MED ORDER — DEXTROSE 5% FOR FLUSHING BEFORE AND AFTER AMBISOME
10.0000 mL | INTRAVENOUS | Status: DC
  Administered 2018-06-13 – 2018-06-21 (×9): 10 mL via INTRAVENOUS
  Filled 2018-06-13 (×2): qty 10
  Filled 2018-06-13 (×2): qty 50
  Filled 2018-06-13 (×5): qty 10
  Filled 2018-06-13: qty 50

## 2018-06-13 MED ORDER — DEXTROSE 5% FOR FLUSHING BEFORE AND AFTER AMBISOME
10.0000 mL | INTRAVENOUS | Status: DC
  Administered 2018-06-13 – 2018-06-21 (×9): 10 mL via INTRAVENOUS
  Filled 2018-06-13 (×2): qty 10
  Filled 2018-06-13: qty 50
  Filled 2018-06-13: qty 10
  Filled 2018-06-13: qty 50
  Filled 2018-06-13 (×4): qty 10
  Filled 2018-06-13: qty 50

## 2018-06-13 MED ORDER — DEXTROSE 5 % IV SOLN
300.0000 mg | Freq: Every day | INTRAVENOUS | Status: DC
  Administered 2018-06-13 – 2018-06-21 (×9): 300 mg via INTRAVENOUS
  Filled 2018-06-13 (×10): qty 75

## 2018-06-13 MED FILL — Amphotericin B Liposome IV For Susp 50 MG: INTRAVENOUS | Qty: 72.5 | Status: AC

## 2018-06-13 NOTE — Progress Notes (Signed)
INFECTIOUS DISEASE PROGRESS NOTE  ID: Christopher Henson is a 38 y.o. male with  Active Problems:   Headache   Protein-calorie malnutrition, severe   AIDS ([place])   Thrush   Cryptococcal meningitis ([place])  Subjective: No complaints.   Abtx:  Anti-infectives (From admission, onward)   Start     Dose/Rate Route Frequency Ordered Stop   06/13/18 1000  amphotericin B liposome (AMBISOME) 300 mg in dextrose 5 % 500 mL IVPB     300 mg 250 mL/hr over 120 Minutes Intravenous Daily 06/13/18 0802     06/11/18 1800  azithromycin (ZITHROMAX) tablet 500 mg  Status:  Discontinued     500 mg Oral Daily-1800 06/11/18 1126 06/11/18 1538   06/11/18 1700  sulfamethoxazole-trimethoprim (BACTRIM DS,SEPTRA DS) 800-160 MG per tablet 2 tablet     2 tablet Oral Every 8 hours 06/11/18 1538 07/02/18 1359   06/09/18 2000  azithromycin (ZITHROMAX) 500 mg in sodium chloride 0.9 % 250 mL IVPB  Status:  Discontinued     500 mg 250 mL/hr over 60 Minutes Intravenous Every 24 hours 06/09/18 1801 06/11/18 1126   06/09/18 1830  cefTRIAXone (ROCEPHIN) 1 g in sodium chloride 0.9 % 100 mL IVPB  Status:  Discontinued     1 g 200 mL/hr over 30 Minutes Intravenous Every 24 hours 06/09/18 1801 06/11/18 1538   06/08/18 1800  vancomycin (VANCOCIN) IVPB 1000 mg/200 mL premix  Status:  Discontinued     1,000 mg 200 mL/hr over 60 Minutes Intravenous Every 8 hours 06/08/18 0723 06/08/18 1021   06/08/18 1600  fluconazole (DIFLUCAN) IVPB 200 mg  Status:  Discontinued     200 mg 100 mL/hr over 60 Minutes Intravenous Daily 06/08/18 0949 06/08/18 1021   06/08/18 1200  amphotericin B liposome (AMBISOME) 290 mg in dextrose 5 % 500 mL IVPB  Status:  Discontinued     4 mg/kg  72.6 kg 250 mL/hr over 120 Minutes Intravenous Daily 06/08/18 0858 06/13/18 0802   06/08/18 1200  flucytosine (ANCOBON) capsule 1,750 mg     25 mg/kg  72.6 kg Oral Every 6 hours 06/08/18 0858     06/08/18 0800  vancomycin (VANCOCIN) 1,500 mg in sodium chloride 0.9 %  500 mL IVPB  Status:  Discontinued     1,500 mg 250 mL/hr over 120 Minutes Intravenous  Once 06/08/18 0655 06/08/18 1021   06/08/18 0700  cefTRIAXone (ROCEPHIN) 2 g in sodium chloride 0.9 % 100 mL IVPB  Status:  Discontinued     2 g 200 mL/hr over 30 Minutes Intravenous Every 12 hours 06/08/18 0651 06/08/18 1021      Medications:  Scheduled: . dextrose  10 mL Intravenous Q24H  . dextrose  10 mL Intravenous Q24H  . feeding supplement (ENSURE ENLIVE)  237 mL Oral BID BM  . flucytosine  25 mg/kg Oral Q6H  . Influenza vac split quadrivalent PF  0.5 mL Intramuscular Tomorrow-1000  . multivitamin with minerals  1 tablet Oral Daily  . nystatin  5 mL Oral QID  . predniSONE  40 mg Oral BID   Followed by  . [START ON 06/14/2018] predniSONE  40 mg Oral Q breakfast   Followed by  . [START ON 06/19/2018] predniSONE  20 mg Oral Q breakfast  . sodium chloride  500 mL Intravenous Daily  . sodium chloride  500 mL Intravenous Q1200  . sulfamethoxazole-trimethoprim  2 tablet Oral Q8H    Objective: Vital signs in last 24 hours: Temp:  [98.2 F (  36.8 C)-98.4 F (36.9 C)] 98.3 F (36.8 C) (03/05 1243) Pulse Rate:  [80-86] 83 (03/05 1243) Resp:  [16-18] 16 (03/05 0416) BP: (109-121)/(75-80) 109/80 (03/05 1243) SpO2:  [96 %-99 %] 96 % (03/05 1243)   General appearance: alert, cooperative and no distress Throat: normal findings: oropharynx pink & moist without lesions or evidence of thrush Neck: FROM Resp: clear to auscultation bilaterally Cardio: regular rate and rhythm GI: normal findings: bowel sounds normal and soft, non-tender  Lab Results Recent Labs    06/12/18 0554 06/13/18 0529  WBC 3.5* 4.4  HGB 12.0* 11.8*  HCT 37.0* 35.1*  NA 137 138  K 4.5 4.6  CL 106 108  CO2 23 23  BUN 11 17  CREATININE 0.76 0.87   Liver Panel Recent Labs    06/13/18 0529  PROT 6.1*  ALBUMIN 3.1*  AST 23  ALT 31  ALKPHOS 90  BILITOT 0.5   Sedimentation Rate No results for input(s):  ESRSEDRATE in the last 72 hours. C-Reactive Protein No results for input(s): CRP in the last 72 hours.  Microbiology: Recent Results (from the past 240 hour(s))  CSF culture     Status: None   Collection Time: 06/08/18  1:57 AM  Result Value Ref Range Status   Specimen Description   Final    BACK Performed at [facility] [address], [place], [place] [address]    Special Requests   Final    Immunocompromised Performed at [facility], [place] [address] [place], [place] [address]    Gram Stain   Final    NO WBC SEEN YEAST CRITICAL RESULT CALLED TO, READ BACK BY AND VERIFIED WITH: Ephriam Knuckles RN, AT [phone] 06/08/18 BY Rush Landmark Performed at [facility], [place] [address] [place], [place] [address]    Culture MODERATE CRYPTOCOCCUS NEOFORMANS  Final   Report Status 06/11/2018 FINAL  Final  Hsv Culture And Typing     Status: None   Collection Time: 06/08/18  3:23 AM  Result Value Ref Range Status   HSV Culture/Type Comment  Final    Comment: (NOTE) Negative No Herpes simplex virus isolated. Performed At: [facility] [place], [place] [id number] Rush Farmer MD [id number]    Source of Sample CSF  Final    Comment: Performed at [facility], [place] [address] [place], [place] [address]  Fungus Culture With Stain     Status: None (Preliminary result)   Collection Time: 06/08/18  3:23 AM  Result Value Ref Range Status   Fungus Stain Final report  Final   Fungus (Mycology) Culture Preliminary report  Final    Comment: (NOTE) Performed At: [facility] [place], [place] [id number] Rush Farmer MD [id number]    Fungal Source PENDING  Incomplete  Fungus Culture Result     Status: None   Collection Time: 06/08/18  3:23 AM  Result Value Ref Range Status   Result 1 Yeast observed  Final    Comment: (NOTE) Performed At: [facility] [place], [place] [id number] Rush Farmer MD [id number]   Fungal organism reflex     Status: Abnormal   Collection Time: 06/08/18  3:23 AM  Result Value Ref Range Status   Fungal result 1 Comment (A)  Final    Comment: (NOTE) Cryptococcus neoformans Heavy growth Reported to [facility]. on [id number] at 0821, [place] faxed to [phone] Reported preliminary positive fungus culture report  to [employer] on [id number] at 0759, kmp faxed to [phone] Reported yeast identification to Marella Bile on [id number] at 1300, faxed to [phone]   kmp Performed At: [facility] [place], [place] [id number] Rush Farmer MD [id number]   Culture, blood (routine x 2)     Status: None (Preliminary result)   Collection Time: 06/09/18  2:36 PM  Result Value Ref Range Status   Specimen Description BLOOD LEFT ANTECUBITAL  Final   Special Requests   Final    BOTTLES DRAWN AEROBIC AND ANAEROBIC Blood Culture adequate volume   Culture   Final    NO GROWTH 4 DAYS Performed at [facility], [place] [address] [place], [place] [address]    Report Status PENDING  Incomplete  Culture, blood (routine x 2)     Status: None (Preliminary result)   Collection Time: 06/09/18  2:39 PM  Result Value Ref Range Status   Specimen Description BLOOD LEFT ANTECUBITAL  Final   Special Requests   Final    BOTTLES DRAWN AEROBIC AND ANAEROBIC Blood Culture adequate volume   Culture   Final    NO GROWTH 4 DAYS Performed at [facility], [place] [address], [place], [place] [address]    Report Status PENDING  Incomplete    Studies/Results: No results found.   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          Bobby Rumpf MD, FACP Infectious Diseases (pager) [phone] [email or web address] 06/13/2018, 3:57 PM  LOS: 5 days

## 2018-06-13 NOTE — Progress Notes (Signed)
PROGRESS NOTE    Christopher Henson  [id number] DOB: 01-30-1981 DOA: 06/07/2018 PCP: Patient, No Pcp Per   Brief Narrative:   38 yo male with pmhx of GERD came to the hospital with complaints of headache and oral thrush.  Patient was diagnosed with HIV and works started on treatment with nystatin.  Lumbar puncture showed elevated opening pressure.  CSF Gram stain was positive for yeast and cryptococcal titers were elevated.  Infectious disease were consulted who started patient on IV amphotericin B and flucytosine.  Eventually patient was also started on treatment for PCP pneumonia.  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  Gram stain for CSF was positive for yeast, cryptococcal titers elevated -Fungal culture positive for cryptococcus neoformans - Plan is to continue IV amphotericin and flucytosine - Continue Bactrim and steroid -After these treatment, will be most likely started on ART. Caution with [facility] -Appreciate Infectious Disease following and their input.  - UA is clear.  Blood cultures thus far is negative -Supportive care. -Hepatitis B and hepatitis C-negative, RPR-nonreactive  Moderate protein calorie malnutrition -Supplements have been ordered.  DVT prophylaxis: SCDs Code Status: Full code Family Communication: Fianc at bedside but she was requested to step out during my interaction with the patient regarding his care. Disposition Plan: Inpatient stay for IV amphotericin  Consultants:   Infectious disease  Subjective: I requested patient's fianc to step out.  I discussed his care with him.  He denied any complaints.  All the questions have been answered.  I have encouraged him to have  discussion regarding his condition with his fiance or any other sexual encounters he may have had.  Review of Systems Otherwise negative except as per HPI, including: General = no fevers, chills, dizziness, malaise, fatigue HEENT/EYES = negative for pain, redness, loss of vision, double vision, blurred vision, loss of hearing, sore throat, hoarseness, dysphagia Cardiovascular= negative for chest pain, palpitation, murmurs, lower extremity swelling Respiratory/lungs= negative for shortness of breath, cough, hemoptysis, wheezing, mucus production Gastrointestinal= negative for nausea, vomiting,, abdominal pain, melena, hematemesis Genitourinary= negative for Dysuria, Hematuria, Change in Urinary Frequency MSK = Negative for arthralgia, myalgias, Back Pain, Joint swelling  Neurology= Negative for headache, seizures, numbness, tingling  Psychiatry= Negative for anxiety, depression, suicidal and homocidal ideation Allergy/Immunology= Medication/Food allergy as listed  Skin= Negative for Rash, lesions, ulcers, itching   Objective: Vitals:   06/12/18 1015 06/12/18 1225 06/12/18 2112 06/13/18 0416  BP: 106/69 108/68 121/75 112/80  Pulse: 79 75 86 80  Resp:   18 16  Temp:  98.1 F (36.7 C) 98.4 F (36.9 C) 98.2 F (36.8 C)  TempSrc:  Oral Oral Oral  SpO2:  99% 99% 97%  Weight:      Height:        Intake/Output Summary (Last 24 hours) at 06/13/2018 1120 Last data filed at 06/13/2018 0411 Gross per 24 hour  Intake 1279.02 ml  Output 2800 ml  Net -1520.98 ml   Filed Weights   06/07/18 2316  Weight: 72.6 kg    Examination:  Constitutional: NAD, calm, comfortable Eyes: PERRL, lids and conjunctivae normal ENMT: Mucous membranes are moist. Posterior pharynx clear of any exudate or lesions.Normal dentition.  Neck: normal, supple, no masses,  no thyromegaly Respiratory: clear to auscultation bilaterally, no wheezing, no crackles. Normal respiratory effort. No accessory muscle use.    Cardiovascular: Regular rate and rhythm, no murmurs / rubs / gallops. No extremity edema. 2+ pedal pulses. No carotid bruits.  Abdomen: no tenderness, no masses palpated. No hepatosplenomegaly. Bowel sounds positive.  Musculoskeletal: no clubbing / cyanosis. No joint deformity upper and lower extremities. Good ROM, no contractures. Normal muscle tone.  Skin: no rashes, lesions, ulcers. No induration Neurologic: CN 2-12 grossly intact. Sensation intact, DTR normal. Strength 5/5 in all 4.  Psychiatric: Normal judgment and insight. Alert and oriented x 3. Normal mood.    Data Reviewed:   CBC: Recent Labs  Lab 06/07/18 2341 06/09/18 0526 06/10/18 0529 06/12/18 0554 06/13/18 0529  WBC 6.6 10.4 7.7 3.5* 4.4  HGB 12.7* 13.5 12.4* 12.0* 11.8*  HCT 38.7* 40.9 37.8* 37.0* 35.1*  MCV 85.4 85.0 85.1 85.8 83.4  PLT 177 151 121* 133* 0000000*   Basic Metabolic Panel: Recent Labs  Lab 06/09/18 0526 06/10/18 0529 06/11/18 0621 06/12/18 0554 06/13/18 0529  NA 138 138 136 137 138  K 3.8 4.2 3.7 4.5 4.6  CL 103 105 104 106 108  CO2 '26 24 23 23 23  '$ GLUCOSE 109* 97 134* 124* 178*  BUN '11 12 10 11 17  '$ CREATININE 0.93 0.82 0.71 0.76 0.87  CALCIUM 8.8* 8.2* 8.0* 8.6* 9.1  MG 1.8 2.4 2.0 2.1 2.1   GFR: Estimated Creatinine Clearance: 115.9 mL/min (by C-G formula based on SCr of 0.87 mg/dL). Liver Function Tests: Recent Labs  Lab 06/07/18 2341 06/09/18 0526 06/13/18 0529  AST 43* 30 23  ALT 60* 40 31  ALKPHOS 105 110 90  BILITOT 0.8 0.9 0.5  PROT 7.0 6.6 6.1*  ALBUMIN 3.6 3.5 3.1*   Recent Labs  Lab 06/07/18 2341  LIPASE 36   No results for input(s): AMMONIA in the last 168 hours. Coagulation Profile: No results for input(s): INR, PROTIME in the last 168 hours. Cardiac Enzymes: No results for input(s): CKTOTAL, CKMB, CKMBINDEX, TROPONINI in the last 168 hours. BNP (last 3 results) No results for input(s): PROBNP in the last 8760 hours. HbA1C: No results for input(s): HGBA1C  in the last 72 hours. CBG: No results for input(s): GLUCAP in the last 168 hours. Lipid Profile: Recent Labs    06/10/18 1737  CHOL 131  HDL 19*  LDLCALC 73  TRIG 195*  CHOLHDL 6.9   Thyroid Function Tests: No results for input(s): TSH, T4TOTAL, FREET4, T3FREE, THYROIDAB in the last 72 hours. Anemia Panel: No results for input(s): VITAMINB12, FOLATE, FERRITIN, TIBC, IRON, RETICCTPCT in the last 72 hours. Sepsis Labs: No results for input(s): PROCALCITON, LATICACIDVEN in the last 168 hours.  Recent Results (from the past 240 hour(s))  CSF culture     Status: None   Collection Time: 06/08/18  1:57 AM  Result Value Ref Range Status   Specimen Description   Final    BACK Performed at [facility] [address] [place], [place] [address]    Special Requests   Final    Immunocompromised Performed at [facility], [place] [address] [place], [place] [address]    Gram Stain   Final    NO WBC SEEN YEAST CRITICAL RESULT CALLED TO, READ BACK BY AND VERIFIED WITH: Ephriam Knuckles RN, AT [phone] 06/08/18 BY Rush Landmark Performed at [facility], [place] [address], [place], [place] [address]    Culture MODERATE  CRYPTOCOCCUS NEOFORMANS  Final   Report Status 06/11/2018 FINAL  Final  Hsv Culture And Typing     Status: None   Collection Time: 06/08/18  3:23 AM  Result Value Ref Range Status   HSV Culture/Type Comment  Final    Comment: (NOTE) Negative No Herpes simplex virus isolated. Performed At: [facility] [place], [place] [id number] Rush Farmer MD [id number]    Source of Sample CSF  Final    Comment: Performed at [facility], [place] [address], [place], [place] [address]  Fungus Culture With Stain     Status: None (Preliminary result)   Collection Time: 06/08/18  3:23 AM  Result Value Ref Range Status   Fungus Stain Final report  Final   Fungus (Mycology) Culture Preliminary report  Final     Comment: (NOTE) Performed At: [facility] [place], [place] [id number] Rush Farmer MD [id number]    Fungal Source PENDING  Incomplete  Fungus Culture Result     Status: None   Collection Time: 06/08/18  3:23 AM  Result Value Ref Range Status   Result 1 Yeast observed  Final    Comment: (NOTE) Performed At: [facility] [place], [place] [id number] Rush Farmer MD [id number]   Fungal organism reflex     Status: Abnormal   Collection Time: 06/08/18  3:23 AM  Result Value Ref Range Status   Fungal result 1 Comment (A)  Final    Comment: (NOTE) Cryptococcus neoformans Heavy growth Reported to [facility]. on [id number] at 0821, [place] faxed to [phone] Reported preliminary positive fungus culture report to Raelyn Ensign on [id number] at 0759, kmp faxed to [phone] Reported yeast identification to Marella Bile on [id number] at 1300, faxed to [phone]   kmp Performed At: [facility] [place], [place] [id number] Rush Farmer MD [id number]   Culture, blood (routine x 2)     Status: None (Preliminary result)   Collection Time: 06/09/18  2:36 PM  Result Value Ref Range Status   Specimen Description BLOOD LEFT ANTECUBITAL  Final   Special Requests   Final    BOTTLES DRAWN AEROBIC AND ANAEROBIC Blood Culture adequate volume   Culture   Final    NO GROWTH 3 DAYS Performed at [facility], [place] [address], [place], [place] [address]    Report Status PENDING  Incomplete  Culture, blood (routine x 2)     Status: None (Preliminary result)   Collection Time: 06/09/18  2:39 PM  Result Value Ref Range Status   Specimen Description BLOOD LEFT ANTECUBITAL  Final   Special Requests   Final    BOTTLES DRAWN AEROBIC AND ANAEROBIC Blood Culture adequate volume   Culture   Final    NO GROWTH 3 DAYS Performed at [facility], [place] [address], [place], [place] [address]    Report Status PENDING   Incomplete         Radiology Studies: No results found.      Scheduled Meds: . dextrose  10 mL Intravenous Q24H  . dextrose  10 mL Intravenous Q24H  . feeding supplement (ENSURE ENLIVE)  237 mL Oral BID BM  . flucytosine  25 mg/kg Oral Q6H  . Influenza vac split quadrivalent PF  0.5 mL Intramuscular Tomorrow-1000  . metoprolol tartrate  12.5 mg Oral BID  . multivitamin with minerals  1 tablet Oral Daily  . nystatin  5 mL Oral QID  .  predniSONE  40 mg Oral BID   Followed by  . [START ON 06/14/2018] predniSONE  40 mg Oral Q breakfast   Followed by  . [START ON 06/19/2018] predniSONE  20 mg Oral Q breakfast  . sodium chloride  500 mL Intravenous Daily  . sodium chloride  500 mL Intravenous Q1200  . sulfamethoxazole-trimethoprim  2 tablet Oral Q8H   Continuous Infusions: . sodium chloride 100 mL/hr at 06/13/18 0032  . amphotericin  B  Liposome (AMBISOME) ADULT IV 300 mg (06/13/18 1028)     LOS: 5 days   Time spent= 35 mins    Konstance Happel Arsenio Loader, MD [facility]  If 7PM-7AM, please contact night-coverage [email or web address] 06/13/2018, 11:20 AM

## 2018-06-14 LAB — COMPREHENSIVE METABOLIC PANEL
ALT: 31 U/L (ref 0–44)
AST: 23 U/L (ref 15–41)
Albumin: 3.3 g/dL — ABNORMAL LOW (ref 3.5–5.0)
Alkaline Phosphatase: 98 U/L (ref 38–126)
Anion gap: 8 (ref 5–15)
BUN: 20 mg/dL (ref 6–20)
CO2: 24 mmol/L (ref 22–32)
Calcium: 9.1 mg/dL (ref 8.9–10.3)
Chloride: 106 mmol/L (ref 98–111)
Creatinine, Ser: 0.9 mg/dL (ref 0.61–1.24)
GFR calc Af Amer: >60 mL/min (ref >60)
GFR calc non Af Amer: >60 mL/min (ref >60)
Glucose, Bld: 195 mg/dL — ABNORMAL HIGH (ref 70–99)
Potassium: 4.5 mmol/L (ref 3.5–5.1)
Sodium: 138 mmol/L (ref 135–145)
Total Bilirubin: 0.6 mg/dL (ref 0.3–1.2)
Total Protein: 6.4 g/dL — ABNORMAL LOW (ref 6.5–8.1)

## 2018-06-14 LAB — CULTURE, BLOOD (ROUTINE X 2)
Culture: NO GROWTH
Culture: NO GROWTH
Special Requests: ADEQUATE
Special Requests: ADEQUATE

## 2018-06-14 LAB — CBC
HCT: 35.8 % — ABNORMAL LOW (ref 39.0–52.0)
Hemoglobin: 11.6 g/dL — ABNORMAL LOW (ref 13.0–17.0)
MCH: 27.6 pg (ref 26.0–34.0)
MCHC: 32.4 g/dL (ref 30.0–36.0)
MCV: 85 fL (ref 80.0–100.0)
Platelets: 145 10*3/uL — ABNORMAL LOW (ref 150–400)
RBC: 4.21 MIL/uL — ABNORMAL LOW (ref 4.22–5.81)
RDW: 14.6 % (ref 11.5–15.5)
WBC: 6.3 10*3/uL (ref 4.0–10.5)
nRBC: 1.9 % — ABNORMAL HIGH (ref 0.0–0.2)

## 2018-06-14 LAB — MAGNESIUM: Magnesium: 2.1 mg/dL (ref 1.7–2.4)

## 2018-06-14 MED ORDER — HEPATITIS B VAC RECOMBINANT 10 MCG/0.5ML IJ SUSP
0.5000 mL | Freq: Once | INTRAMUSCULAR | Status: AC
  Administered 2018-06-14: 0.5 mL via INTRAMUSCULAR
  Filled 2018-06-14: qty 0.5

## 2018-06-14 MED ORDER — PNEUMOCOCCAL 13-VAL CONJ VACC IM SUSP
0.5000 mL | INTRAMUSCULAR | Status: AC
  Administered 2018-06-14: 0.5 mL via INTRAMUSCULAR
  Filled 2018-06-14: qty 0.5

## 2018-06-14 MED ORDER — TETANUS-DIPHTH-ACELL PERTUSSIS 5-2.5-18.5 LF-MCG/0.5 IM SUSP
0.5000 mL | Freq: Once | INTRAMUSCULAR | Status: DC
  Filled 2018-06-14: qty 0.5

## 2018-06-14 MED ORDER — HEPATITIS A VACCINE 1440 EL U/ML IM SUSP
1.0000 mL | Freq: Once | INTRAMUSCULAR | Status: AC
  Administered 2018-06-14: 1440 [IU] via INTRAMUSCULAR
  Filled 2018-06-14: qty 1

## 2018-06-14 NOTE — Progress Notes (Signed)
PROGRESS NOTE    Christopher Henson  [id number] DOB: 01/24/1981 DOA: 06/07/2018 PCP: Patient, No Pcp Per   Brief Narrative:   38 yo male with pmhx of GERD came to the hospital with complaints of headache and oral thrush.  Patient was diagnosed with HIV and works started on treatment with nystatin.  Lumbar puncture showed elevated opening pressure.  CSF Gram stain was positive for yeast and cryptococcal titers were elevated.  Infectious disease were consulted who started patient on IV amphotericin B and flucytosine.  Eventually patient was also started on treatment for PCP pneumonia.  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  Gram stain for CSF was positive for yeast, cryptococcal titers elevated -Fungal culture positive for cryptococcus neoformans - Continue flucytosine and amphotericin-until 3/14 - Continue Bactrim and steroid for total of 21 days -After these treatment, will be most likely started on ART. Caution with IRIS -Appreciate input from infectious disease-appreciate final recommendations as well in case of patient leaves over the weekend - UA is clear.  Blood cultures thus far is negative -Supportive care. -Hepatitis B and hepatitis C-negative, RPR-nonreactive  Moderate protein calorie malnutrition -Supplements have been ordered.  DVT prophylaxis: SCDs Code Status: Full code Family Communication: None at bedside Disposition Plan: Maintain inpatient stay for IV amphotericin  Consultants:   Infectious disease  Subjective: Patient states he feels better does not have any complaints. I had another extensive discussion with the patient this morning and encouraged him to notify his partners about  his medical status.  He understands this and will do so.  He is also aware that health care department of the state is also been notified and will be letting them know.  I have offered my assistance if necessary.  Case manager, Juliann Pulse in the room with me during my discussion with the patient  Review of Systems Otherwise negative except as per HPI, including: General = no fevers, chills, dizziness, malaise, fatigue HEENT/EYES = negative for pain, redness, loss of vision, double vision, blurred vision, loss of hearing, sore throat, hoarseness, dysphagia Cardiovascular= negative for chest pain, palpitation, murmurs, lower extremity swelling Respiratory/lungs= negative for shortness of breath, cough, hemoptysis, wheezing, mucus production Gastrointestinal= negative for nausea, vomiting,, abdominal pain, melena, hematemesis Genitourinary= negative for Dysuria, Hematuria, Change in Urinary Frequency MSK = Negative for arthralgia, myalgias, Back Pain, Joint swelling  Neurology= Negative for headache, seizures, numbness, tingling  Psychiatry= Negative for anxiety, depression, suicidal and homocidal ideation Allergy/Immunology= Medication/Food allergy as listed  Skin= Negative for Rash, lesions, ulcers, itching    Objective: Vitals:   06/13/18 1243 06/13/18 2111 06/14/18 0533 06/14/18 0537  BP: 109/80 120/72 107/75 (!) 140/56  Pulse: 83  93 80  Resp:  '16 18 18  '$ Temp: 98.3 F (36.8 C) 98.2 F (36.8 C) 98.2 F (36.8 C) 99.6 F (37.6 C)  TempSrc: Oral Oral Oral Oral  SpO2: 96% 96% 97% 94%  Weight:      Height:        Intake/Output Summary (Last 24 hours) at 06/14/2018 1123 Last data filed at 06/14/2018 0846 Gross per 24 hour  Intake 4460.66 ml  Output 2950 ml  Net 1510.66 ml   Filed Weights   06/07/18 2316  Weight: 72.6  kg    Examination:  Constitutional: NAD, calm, comfortable, bilateral temporal wasting Eyes: PERRL, lids and conjunctivae normal ENMT: Mucous membranes are moist.  Posterior pharynx clear of any exudate or lesions.Normal dentition.  Neck: normal, supple, no masses, no thyromegaly Respiratory: clear to auscultation bilaterally, no wheezing, no crackles. Normal respiratory effort. No accessory muscle use.  Cardiovascular: Regular rate and rhythm, no murmurs / rubs / gallops. No extremity edema. 2+ pedal pulses. No carotid bruits.  Abdomen: no tenderness, no masses palpated. No hepatosplenomegaly. Bowel sounds positive.  Musculoskeletal: no clubbing / cyanosis. No joint deformity upper and lower extremities. Good ROM, no contractures. Normal muscle tone.  Skin: no rashes, lesions, ulcers. No induration Neurologic: CN 2-12 grossly intact. Sensation intact, DTR normal. Strength 5/5 in all 4.  Psychiatric: Normal judgment and insight. Alert and oriented x 3. Normal mood.    Data Reviewed:   CBC: Recent Labs  Lab 06/09/18 0526 06/10/18 0529 06/12/18 0554 06/13/18 0529 06/14/18 0521  WBC 10.4 7.7 3.5* 4.4 6.3  HGB 13.5 12.4* 12.0* 11.8* 11.6*  HCT 40.9 37.8* 37.0* 35.1* 35.8*  MCV 85.0 85.1 85.8 83.4 85.0  PLT 151 121* 133* 125* Q000111Q*   Basic Metabolic Panel: Recent Labs  Lab 06/10/18 0529 06/11/18 0621 06/12/18 0554 06/13/18 0529 06/14/18 0521  NA 138 136 137 138 138  K 4.2 3.7 4.5 4.6 4.5  CL 105 104 106 108 106  CO2 '24 23 23 23 24  '$ GLUCOSE 97 134* 124* 178* 195*  BUN '12 10 11 17 20  '$ CREATININE 0.82 0.71 0.76 0.87 0.90  CALCIUM 8.2* 8.0* 8.6* 9.1 9.1  MG 2.4 2.0 2.1 2.1 2.1   GFR: Estimated Creatinine Clearance: 112 mL/min (by C-G formula based on SCr of 0.9 mg/dL). Liver Function Tests: Recent Labs  Lab 06/07/18 2341 06/09/18 0526 06/13/18 0529 06/14/18 0521  AST 43* '30 23 23  '$ ALT 60* 40 31 31  ALKPHOS 105 110 90 98  BILITOT 0.8 0.9 0.5 0.6  PROT 7.0 6.6 6.1* 6.4*  ALBUMIN 3.6 3.5 3.1* 3.3*   Recent Labs  Lab 06/07/18 2341  LIPASE 36   No results for input(s): AMMONIA in the last 168 hours. Coagulation Profile: No  results for input(s): INR, PROTIME in the last 168 hours. Cardiac Enzymes: No results for input(s): CKTOTAL, CKMB, CKMBINDEX, TROPONINI in the last 168 hours. BNP (last 3 results) No results for input(s): PROBNP in the last 8760 hours. HbA1C: No results for input(s): HGBA1C in the last 72 hours. CBG: No results for input(s): GLUCAP in the last 168 hours. Lipid Profile: No results for input(s): CHOL, HDL, LDLCALC, TRIG, CHOLHDL, LDLDIRECT in the last 72 hours. Thyroid Function Tests: No results for input(s): TSH, T4TOTAL, FREET4, T3FREE, THYROIDAB in the last 72 hours. Anemia Panel: No results for input(s): VITAMINB12, FOLATE, FERRITIN, TIBC, IRON, RETICCTPCT in the last 72 hours. Sepsis Labs: No results for input(s): PROCALCITON, LATICACIDVEN in the last 168 hours.  Recent Results (from the past 240 hour(s))  CSF culture     Status: None   Collection Time: 06/08/18  1:57 AM  Result Value Ref Range Status   Specimen Description   Final    BACK Performed at [facility] [address], [place], [place] [address]    Special Requests   Final    Immunocompromised Performed at [facility], [place] [address], [place], [place] [address]    Gram Stain   Final    NO WBC SEEN YEAST  CRITICAL RESULT CALLED TO, READ BACK BY AND VERIFIED WITH: Ephriam Knuckles RN, AT [phone] 06/08/18 BY Rush Landmark Performed at [facility], [place] [address] [place], [place] [address]    Culture MODERATE CRYPTOCOCCUS NEOFORMANS  Final   Report Status 06/11/2018 FINAL  Final  Hsv Culture And Typing     Status: None   Collection Time: 06/08/18  3:23 AM  Result Value Ref Range Status   HSV Culture/Type Comment  Final    Comment: (NOTE) Negative No Herpes simplex virus isolated. Performed At: [facility] [place], [place] [id number] Rush Farmer MD [id number]    Source of Sample CSF  Final    Comment: Performed at [facility],  [place] [address] [place], [place] [address]  Fungus Culture With Stain     Status: None (Preliminary result)   Collection Time: 06/08/18  3:23 AM  Result Value Ref Range Status   Fungus Stain Final report  Final   Fungus (Mycology) Culture Preliminary report  Final    Comment: (NOTE) Performed At: [facility] [place], [place] [id number] Rush Farmer MD [id number]    Fungal Source PENDING  Incomplete  Fungus Culture Result     Status: None   Collection Time: 06/08/18  3:23 AM  Result Value Ref Range Status   Result 1 Yeast observed  Final    Comment: (NOTE) Performed At: [facility] [place], [place] [id number] Rush Farmer MD [id number]   Fungal organism reflex     Status: Abnormal   Collection Time: 06/08/18  3:23 AM  Result Value Ref Range Status   Fungal result 1 Comment (A)  Final    Comment: (NOTE) Cryptococcus neoformans Heavy growth Reported to [facility]. on [id number] at 0821, [place] faxed to [phone] Reported preliminary positive fungus culture report to Raelyn Ensign on [id number] at 0759, kmp faxed to [phone] Reported yeast identification to Marella Bile on [id number] at 1300, faxed to [phone]   kmp Performed At: [facility] [place], [place] [id number] Rush Farmer MD [id number]   Culture, blood (routine x 2)     Status: None (Preliminary result)   Collection Time: 06/09/18  2:36 PM  Result Value Ref Range Status   Specimen Description BLOOD LEFT ANTECUBITAL  Final   Special Requests   Final    BOTTLES DRAWN AEROBIC AND ANAEROBIC Blood Culture adequate volume   Culture   Final    NO GROWTH 4 DAYS Performed at [facility], [place] [address], [place], [place] [address]    Report Status PENDING  Incomplete  Culture, blood (routine x 2)     Status: None (Preliminary result)   Collection Time: 06/09/18  2:39 PM  Result Value Ref Range Status   Specimen Description BLOOD  LEFT ANTECUBITAL  Final   Special Requests   Final    BOTTLES DRAWN AEROBIC AND ANAEROBIC Blood Culture adequate volume   Culture   Final    NO GROWTH 4 DAYS Performed at [facility], [place] [address] [place], [place] [address]    Report Status PENDING  Incomplete         Radiology Studies: No results found.      Scheduled Meds: . dextrose  10 mL Intravenous Q24H  . dextrose  10 mL Intravenous Q24H  . feeding supplement (ENSURE ENLIVE)  237 mL Oral BID BM  . flucytosine  25 mg/kg Oral Q6H  .  hepatitis A virus (PF) vaccine  1 mL Intramuscular Once  . hepatitis b vaccine  0.5 mL Intramuscular Once  . Influenza vac split quadrivalent PF  0.5 mL Intramuscular Tomorrow-1000  . multivitamin with minerals  1 tablet Oral Daily  . nystatin  5 mL Oral QID  . pneumococcal 13-valent conjugate vaccine  0.5 mL Intramuscular Tomorrow-1000  . predniSONE  40 mg Oral Q breakfast   Followed by  . [START ON 06/19/2018] predniSONE  20 mg Oral Q breakfast  . sodium chloride  500 mL Intravenous Daily  . sodium chloride  500 mL Intravenous Q1200  . sulfamethoxazole-trimethoprim  2 tablet Oral Q8H  . [START ON 06/15/2018] Tdap  0.5 mL Intramuscular Once   Continuous Infusions: . sodium chloride 100 mL/hr at 06/14/18 0212  . amphotericin  B  Liposome (AMBISOME) ADULT IV Stopped (06/13/18 1230)     LOS: 6 days       Daniel Ritthaler Arsenio Loader, MD [facility]  If 7PM-7AM, please contact night-coverage [email or web address] 06/14/2018, 11:23 AM

## 2018-06-14 NOTE — Progress Notes (Signed)
INFECTIOUS DISEASE PROGRESS NOTE  ID: Christopher Henson is a 38 y.o. male with  Active Problems:   Headache   Protein-calorie malnutrition, severe   AIDS ([place])   Thrush   Cryptococcal meningitis ([place])  Subjective: headache yesterday. Pleuritc/L flank CP with cough.   Eating well.   Abtx:  Anti-infectives (From admission, onward)   Start     Dose/Rate Route Frequency Ordered Stop   06/13/18 1000  amphotericin B liposome (AMBISOME) 300 mg in dextrose 5 % 500 mL IVPB     300 mg 250 mL/hr over 120 Minutes Intravenous Daily 06/13/18 0802     06/11/18 1800  azithromycin (ZITHROMAX) tablet 500 mg  Status:  Discontinued     500 mg Oral Daily-1800 06/11/18 1126 06/11/18 1538   06/11/18 1700  sulfamethoxazole-trimethoprim (BACTRIM DS,SEPTRA DS) 800-160 MG per tablet 2 tablet     2 tablet Oral Every 8 hours 06/11/18 1538 07/02/18 1359   06/09/18 2000  azithromycin (ZITHROMAX) 500 mg in sodium chloride 0.9 % 250 mL IVPB  Status:  Discontinued     500 mg 250 mL/hr over 60 Minutes Intravenous Every 24 hours 06/09/18 1801 06/11/18 1126   06/09/18 1830  cefTRIAXone (ROCEPHIN) 1 g in sodium chloride 0.9 % 100 mL IVPB  Status:  Discontinued     1 g 200 mL/hr over 30 Minutes Intravenous Every 24 hours 06/09/18 1801 06/11/18 1538   06/08/18 1800  vancomycin (VANCOCIN) IVPB 1000 mg/200 mL premix  Status:  Discontinued     1,000 mg 200 mL/hr over 60 Minutes Intravenous Every 8 hours 06/08/18 0723 06/08/18 1021   06/08/18 1600  fluconazole (DIFLUCAN) IVPB 200 mg  Status:  Discontinued     200 mg 100 mL/hr over 60 Minutes Intravenous Daily 06/08/18 0949 06/08/18 1021   06/08/18 1200  amphotericin B liposome (AMBISOME) 290 mg in dextrose 5 % 500 mL IVPB  Status:  Discontinued     4 mg/kg  72.6 kg 250 mL/hr over 120 Minutes Intravenous Daily 06/08/18 0858 06/13/18 0802   06/08/18 1200  flucytosine (ANCOBON) capsule 1,750 mg     25 mg/kg  72.6 kg Oral Every 6 hours 06/08/18 0858     06/08/18 0800   vancomycin (VANCOCIN) 1,500 mg in sodium chloride 0.9 % 500 mL IVPB  Status:  Discontinued     1,500 mg 250 mL/hr over 120 Minutes Intravenous  Once 06/08/18 0655 06/08/18 1021   06/08/18 0700  cefTRIAXone (ROCEPHIN) 2 g in sodium chloride 0.9 % 100 mL IVPB  Status:  Discontinued     2 g 200 mL/hr over 30 Minutes Intravenous Every 12 hours 06/08/18 0651 06/08/18 1021      Medications:  Scheduled: . dextrose  10 mL Intravenous Q24H  . dextrose  10 mL Intravenous Q24H  . feeding supplement (ENSURE ENLIVE)  237 mL Oral BID BM  . flucytosine  25 mg/kg Oral Q6H  . Influenza vac split quadrivalent PF  0.5 mL Intramuscular Tomorrow-1000  . multivitamin with minerals  1 tablet Oral Daily  . nystatin  5 mL Oral QID  . predniSONE  40 mg Oral Q breakfast   Followed by  . [START ON 06/19/2018] predniSONE  20 mg Oral Q breakfast  . sodium chloride  500 mL Intravenous Daily  . sodium chloride  500 mL Intravenous Q1200  . sulfamethoxazole-trimethoprim  2 tablet Oral Q8H    Objective: Vital signs in last 24 hours: Temp:  [98.2 F (36.8 C)-99.6 F (37.6 C)] 99.6  F (37.6 C) (03/06 0537) Pulse Rate:  [80-93] 80 (03/06 0537) Resp:  [16-18] 18 (03/06 0537) BP: (107-140)/(56-80) 140/56 (03/06 0537) SpO2:  [94 %-97 %] 94 % (03/06 0537)   General appearance: alert, cooperative and no distress Resp: clear to auscultation bilaterally Cardio: regular rate and rhythm GI: normal findings: bowel sounds normal and soft, non-tender  Lab Results Recent Labs    06/13/18 0529 06/14/18 0521  WBC 4.4 6.3  HGB 11.8* 11.6*  HCT 35.1* 35.8*  NA 138 138  K 4.6 4.5  CL 108 106  CO2 23 24  BUN 17 20  CREATININE 0.87 0.90   Liver Panel Recent Labs    06/13/18 0529 06/14/18 0521  PROT 6.1* 6.4*  ALBUMIN 3.1* 3.3*  AST 23 23  ALT 31 31  ALKPHOS 90 98  BILITOT 0.5 0.6   Sedimentation Rate No results for input(s): ESRSEDRATE in the last 72 hours. C-Reactive Protein No results for input(s):  CRP in the last 72 hours.  Microbiology: Recent Results (from the past 240 hour(s))  CSF culture     Status: None   Collection Time: 06/08/18  1:57 AM  Result Value Ref Range Status   Specimen Description   Final    BACK Performed at [facility] [address] [place], [place] [address]    Special Requests   Final    Immunocompromised Performed at [facility], [place] [address], [place], [place] [address]    Gram Stain   Final    NO WBC SEEN YEAST CRITICAL RESULT CALLED TO, READ BACK BY AND VERIFIED WITH: Ephriam Knuckles RN, AT [phone] 06/08/18 BY Rush Landmark Performed at [facility], [place] [address], [place], [place] [address]    Culture MODERATE CRYPTOCOCCUS NEOFORMANS  Final   Report Status 06/11/2018 FINAL  Final  Hsv Culture And Typing     Status: None   Collection Time: 06/08/18  3:23 AM  Result Value Ref Range Status   HSV Culture/Type Comment  Final    Comment: (NOTE) Negative No Herpes simplex virus isolated. Performed At: [facility] [place], [place] [id number] Rush Farmer MD [id number]    Source of Sample CSF  Final    Comment: Performed at [facility], [place] [address], [place], [place] [address]  Fungus Culture With Stain     Status: None (Preliminary result)   Collection Time: 06/08/18  3:23 AM  Result Value Ref Range Status   Fungus Stain Final report  Final   Fungus (Mycology) Culture Preliminary report  Final    Comment: (NOTE) Performed At: [facility] [place], [place] [id number] Rush Farmer MD [id number]    Fungal Source PENDING  Incomplete  Fungus Culture Result     Status: None   Collection Time: 06/08/18  3:23 AM  Result Value Ref Range Status   Result 1 Yeast observed  Final    Comment: (NOTE) Performed At: [facility] [place], [place] [id number] Rush Farmer MD [id number]   Fungal organism reflex      Status: Abnormal   Collection Time: 06/08/18  3:23 AM  Result Value Ref Range Status   Fungal result 1 Comment (A)  Final    Comment: (NOTE) Cryptococcus neoformans Heavy growth Reported to [facility]. on [id number] at 0821, [place] faxed to [phone] Reported preliminary positive fungus culture report to Raelyn Ensign on [id number] at 0759, kmp faxed to [phone] Reported yeast  identification to Marella Bile on [id number] at 1300, faxed to [phone]   kmp Performed At: [facility] [place], [place] [id number] Rush Farmer MD [id number]   Culture, blood (routine x 2)     Status: None (Preliminary result)   Collection Time: 06/09/18  2:36 PM  Result Value Ref Range Status   Specimen Description BLOOD LEFT ANTECUBITAL  Final   Special Requests   Final    BOTTLES DRAWN AEROBIC AND ANAEROBIC Blood Culture adequate volume   Culture   Final    NO GROWTH 4 DAYS Performed at [facility], [place] [address] [place], [place] [address]    Report Status PENDING  Incomplete  Culture, blood (routine x 2)     Status: None (Preliminary result)   Collection Time: 06/09/18  2:39 PM  Result Value Ref Range Status   Specimen Description BLOOD LEFT ANTECUBITAL  Final   Special Requests   Final    BOTTLES DRAWN AEROBIC AND ANAEROBIC Blood Culture adequate volume   Culture   Final    NO GROWTH 4 DAYS Performed at [facility], [place] [address] [place], [place] [address]    Report Status PENDING  Incomplete    Studies/Results: No results found.   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 If needed, will change him to po flucon '800mg'$  daily to complete the 2 weeks, at d/c, then onto '400mg'$  qday for 10 weeks.      Start hep A/B vaccine series.  Pneumovax before d/c, flu vax  done Tdap tomorrow Encouraged him to disclose to partner Plan for Ampho til 3-14 if he allows.  Will need bactrim TID for 21 days.    Bobby Rumpf MD, FACP Infectious Diseases (pager) [phone] [email or web address] 06/14/2018, 9:17 AM  LOS: 6 days

## 2018-06-14 NOTE — Plan of Care (Signed)
Plan of care reviewed and discussed with the patient. 

## 2018-06-14 NOTE — Progress Notes (Signed)
Nutrition Follow-up  DOCUMENTATION CODES:   Severe malnutrition in context of acute illness/injury  INTERVENTION:  - Continue Ensure Enlive BID. - Continue 2 PM daily snack. - Continue to encourage PO intakes. - Weigh patient today.    NUTRITION DIAGNOSIS:   Severe Malnutrition related to acute illness(recent dx of HIV) as evidenced by mild fat depletion, moderate fat depletion, mild muscle depletion, moderate muscle depletion. -ongoing  GOAL:   Patient will meet greater than or equal to 90% of their needs -met on average.  MONITOR:   PO intake, Supplement acceptance, Weight trends, Labs, I & O's  ASSESSMENT:   38 y.o. male with past medical history of alcohol abuse and GERD. Patient presented to the ED with complaints of N/V, dizziness without vertigo, and headaches x1 week. He had been seen in the ED on 2/26 and symptoms were concerning for HIV; oral thrush also present at that time. At that time, he was given nystatin and was recommended to get an HIV checkup and lumbar puncture.  No new weight since admission on 2/28. Patient consumed 100% of breakfast and dinner (total of 1495 kcal, 60 grams of protein) on 3/4 and 100% of lunch (480 kcal, 13 grams of protein) on 3/5. Visitors have been bringing in foods for patient, including for breakfast this AM. Per review of order, patient has accepted 4 of 7 bottles of Ensure since 3/3 AM.   Per Dr. Latina Craver note this AM: patient with newly dx HIV/AIDS, cryptococcal meningitis s/p LP, hepatitis B and C negative. Plan is for patient to remain inpatient to receive IV amphotericin.    Medications reviewed; daily multivitamin with minerals, 5 ml mycostatin QID, 40 mg deltasone BID 3/3-3/6, 40 mg once/day 3/6-3/11. Labs reviewed. IVF; NS @ 100 ml/hr.     Diet Order:   Diet Order            Diet regular Room service appropriate? Yes; Fluid consistency: Thin  Diet effective now              EDUCATION NEEDS:   Education needs have  been addressed  Skin:  Skin Assessment: Reviewed RN Assessment  Last BM:  3/2  Height:   Ht Readings from Last 1 Encounters:  06/07/18 6' (1.829 m)    Weight:   Wt Readings from Last 1 Encounters:  06/07/18 72.6 kg    Ideal Body Weight:  80.91 kg  BMI:  Body mass index is 21.7 kg/m.  Estimated Nutritional Needs:   Kcal:  0689-3406 kcal  Protein:  100-115 grams  Fluid:  >/= 2.2 L/day     Jarome Matin, MS, RD, LDN, [facility] Inpatient Clinical Dietitian Pager # [phone] After hours/weekend pager # [phone]

## 2018-06-15 LAB — COMPREHENSIVE METABOLIC PANEL
ALT: 34 U/L (ref 0–44)
AST: 26 U/L (ref 15–41)
Albumin: 3.1 g/dL — ABNORMAL LOW (ref 3.5–5.0)
Alkaline Phosphatase: 91 U/L (ref 38–126)
Anion gap: 8 (ref 5–15)
BUN: 20 mg/dL (ref 6–20)
CO2: 22 mmol/L (ref 22–32)
Calcium: 8.4 mg/dL — ABNORMAL LOW (ref 8.9–10.3)
Chloride: 107 mmol/L (ref 98–111)
Creatinine, Ser: 0.97 mg/dL (ref 0.61–1.24)
GFR calc Af Amer: >60 mL/min (ref >60)
GFR calc non Af Amer: >60 mL/min (ref >60)
Glucose, Bld: 200 mg/dL — ABNORMAL HIGH (ref 70–99)
Potassium: 3.7 mmol/L (ref 3.5–5.1)
Sodium: 137 mmol/L (ref 135–145)
Total Bilirubin: 0.9 mg/dL (ref 0.3–1.2)
Total Protein: 5.6 g/dL — ABNORMAL LOW (ref 6.5–8.1)

## 2018-06-15 LAB — CBC
HCT: 33.1 % — ABNORMAL LOW (ref 39.0–52.0)
Hemoglobin: 10.6 g/dL — ABNORMAL LOW (ref 13.0–17.0)
MCH: 27.3 pg (ref 26.0–34.0)
MCHC: 32 g/dL (ref 30.0–36.0)
MCV: 85.3 fL (ref 80.0–100.0)
Platelets: 134 10*3/uL — ABNORMAL LOW (ref 150–400)
RBC: 3.88 MIL/uL — ABNORMAL LOW (ref 4.22–5.81)
RDW: 15.1 % (ref 11.5–15.5)
WBC: 7.4 10*3/uL (ref 4.0–10.5)
nRBC: 1.6 % — ABNORMAL HIGH (ref 0.0–0.2)

## 2018-06-15 LAB — MAGNESIUM: Magnesium: 2 mg/dL (ref 1.7–2.4)

## 2018-06-15 MED ORDER — POTASSIUM CHLORIDE CRYS ER 20 MEQ PO TBCR
20.0000 meq | EXTENDED_RELEASE_TABLET | Freq: Once | ORAL | Status: AC
  Administered 2018-06-15: 20 meq via ORAL
  Filled 2018-06-15: qty 1

## 2018-06-15 NOTE — Plan of Care (Signed)
  Problem: Clinical Measurements: Goal: Ability to maintain clinical measurements within normal limits will improve Outcome: Progressing Goal: Will remain free from infection Outcome: Progressing Goal: Diagnostic test results will improve Outcome: Progressing   Problem: Coping: Goal: Level of anxiety will decrease Outcome: Progressing   

## 2018-06-15 NOTE — Progress Notes (Signed)
A&Ox3, up ad lib in room, still complaining of mild headache and neck pain, offered pain medication but he stated that he does not want it at this time. Will continue to monitor.

## 2018-06-15 NOTE — Progress Notes (Signed)
PROGRESS NOTE    Christopher Henson  [id number] DOB: [birth year] DOA: 06/07/2018 PCP: Patient, No Pcp Per   Brief Narrative:   38 yo male with pmhx of GERD came to the hospital with complaints of headache and oral thrush.  Patient was diagnosed with HIV and works started on treatment with nystatin.  Lumbar puncture showed elevated opening pressure.  CSF Gram stain was positive for yeast and cryptococcal titers were elevated.  Infectious disease were consulted who started patient on IV amphotericin B and flucytosine.  Eventually patient was also started on treatment for PCP pneumonia.  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  Cryptococcal meningitis Pneumocystis pneumonia with hypoxia - CT of the head-negative, LP was consistent with elevated opening pressure of 35.  Gram stain for CSF was positive for yeast, cryptococcal titers elevated -Fungal culture positive for cryptococcus neoformans - Continue flucytosine and amphotericin-until 3/14 - Continue Bactrim and steroid for total of 21 days -After these treatment, Henson be most likely started on ART. Caution with IRIS -Appreciate input from infectious disease-appreciate final recommendations as well in case of patient leaves over the weekend - UA is clear.  Blood cultures thus far is negative -Supportive care. -Hepatitis B and hepatitis C-negative, RPR-nonreactive  Per ID: Hepatitis A/B vaccine series, Pneumovax prior to discharge, Tdap.  Moderate protein calorie malnutrition -Supplements have been ordered.  DVT prophylaxis: SCDs Code Status: Full code Family Communication: None at bedside Disposition Plan: Maintain inpatient stay for IV treatment  Consultants:   Infectious disease  Antibiotics: -Amphotericin/flucytosine-day 8 -Bactrim/prednisone-day 4  Subjective: No complaints besides headache  this morning.  No other acute events overnight.  Review of Systems Otherwise negative except as per HPI, including: General = no fevers, chills, dizziness, malaise, fatigue HEENT/EYES = negative for pain, redness, loss of vision, double vision, blurred vision, loss of hearing, sore throat, hoarseness, dysphagia Cardiovascular= negative for chest pain, palpitation, murmurs, lower extremity swelling Respiratory/lungs= negative for shortness of breath, cough, hemoptysis, wheezing, mucus production Gastrointestinal= negative for nausea, vomiting,, abdominal pain, melena, hematemesis Genitourinary= negative for Dysuria, Hematuria, Change in Urinary Frequency MSK = Negative for arthralgia, myalgias, Back Pain, Joint swelling  Neurology= Negative for headache, seizures, numbness, tingling  Psychiatry= Negative for anxiety, depression, suicidal and homocidal ideation Allergy/Immunology= Medication/Food allergy as listed  Skin= Negative for Rash, lesions, ulcers, itching   Objective: Vitals:   06/14/18 1306 06/14/18 2000 06/14/18 2024 06/15/18 0510  BP: 121/80  122/83 115/81  Pulse: 92 86 92 94  Resp: '16 16 20 20  '$ Temp: 98.3 F (36.8 C)  99 F (37.2 C) 98.1 F (36.7 C)  TempSrc: Oral  Oral Oral  SpO2: 99%  97% 96%  Weight:      Height:        Intake/Output Summary (Last 24 hours) at 06/15/2018 1042 Last data filed at 06/15/2018 0600 Gross per 24 hour  Intake 4327.29 ml  Output 1275 ml  Net 3052.29 ml   Filed Weights   06/07/18 2316 06/14/18 1301  Weight: 72.6 kg 65.1 kg    Examination:  Constitutional: NAD, calm, comfortable Eyes: PERRL, lids and conjunctivae normal ENMT: Mucous membranes are moist. Posterior pharynx clear of any exudate or lesions.Normal dentition.  Neck: normal, supple, no masses, no thyromegaly Respiratory: clear to auscultation bilaterally, no wheezing, no crackles. Normal respiratory effort. No accessory muscle use.  Cardiovascular: Regular rate and  rhythm, no murmurs / rubs / gallops. No extremity edema. 2+ pedal pulses. No carotid bruits.  Abdomen: no tenderness, no masses palpated. No hepatosplenomegaly. Bowel sounds positive.  Musculoskeletal: no clubbing / cyanosis. No joint deformity upper and lower extremities. Good ROM, no contractures. Normal muscle tone.  Skin: no rashes, lesions, ulcers. No induration Neurologic: CN 2-12 grossly intact. Sensation intact, DTR normal. Strength 4+/5 in all 4.  Psychiatric: AAOx3; flat affect this morning   Data Reviewed:   CBC: Recent Labs  Lab 06/10/18 0529 06/12/18 0554 06/13/18 0529 06/14/18 0521 06/15/18 0454  WBC 7.7 3.5* 4.4 6.3 7.4  HGB 12.4* 12.0* 11.8* 11.6* 10.6*  HCT 37.8* 37.0* 35.1* 35.8* 33.1*  MCV 85.1 85.8 83.4 85.0 85.3  PLT 121* 133* 125* 145* [id number]*   Basic Metabolic Panel: Recent Labs  Lab 06/11/18 0621 06/12/18 0554 06/13/18 0529 06/14/18 0521 06/15/18 0454  NA 136 137 138 138 137  K 3.7 4.5 4.6 4.5 3.7  CL 104 106 108 106 107  CO2 '23 23 23 24 22  '$ GLUCOSE 134* 124* 178* 195* 200*  BUN '10 11 17 20 20  '$ CREATININE 0.71 0.76 0.87 0.90 0.97  CALCIUM 8.0* 8.6* 9.1 9.1 8.4*  MG 2.0 2.1 2.1 2.1 2.0   GFR: Estimated Creatinine Clearance: 93.2 mL/min (by C-G formula based on SCr of 0.97 mg/dL). Liver Function Tests: Recent Labs  Lab 06/09/18 0526 06/13/18 0529 06/14/18 0521 06/15/18 0454  AST '30 23 23 26  '$ ALT 40 31 31 34  ALKPHOS 110 90 98 91  BILITOT 0.9 0.5 0.6 0.9  PROT 6.6 6.1* 6.4* 5.6*  ALBUMIN 3.5 3.1* 3.3* 3.1*   No results for input(s): LIPASE, AMYLASE in the last 168 hours. No results for input(s): AMMONIA in the last 168 hours. Coagulation Profile: No results for input(s): INR, PROTIME in the last 168 hours. Cardiac Enzymes: No results for input(s): CKTOTAL, CKMB, CKMBINDEX, TROPONINI in the last 168 hours. BNP (last 3 results) No results for input(s): PROBNP in the last 8760 hours. HbA1C: No results for input(s): HGBA1C in the last  72 hours. CBG: No results for input(s): GLUCAP in the last 168 hours. Lipid Profile: No results for input(s): CHOL, HDL, LDLCALC, TRIG, CHOLHDL, LDLDIRECT in the last 72 hours. Thyroid Function Tests: No results for input(s): TSH, T4TOTAL, FREET4, T3FREE, THYROIDAB in the last 72 hours. Anemia Panel: No results for input(s): VITAMINB12, FOLATE, FERRITIN, TIBC, IRON, RETICCTPCT in the last 72 hours. Sepsis Labs: No results for input(s): PROCALCITON, LATICACIDVEN in the last 168 hours.  Recent Results (from the past 240 hour(s))  CSF culture     Status: None   Collection Time: 06/08/18  1:57 AM  Result Value Ref Range Status   Specimen Description   Final    BACK Performed at [facility] [address], [place], [place] [address]    Special Requests   Final    Immunocompromised Performed at [facility], [place] [address] [place], [place] [address]    Gram Stain   Final    NO WBC SEEN YEAST CRITICAL RESULT CALLED TO, READ BACK BY AND VERIFIED WITH: Ephriam Knuckles RN, AT [phone] 06/08/18 BY Rush Landmark Performed at [facility], [place] [address], [place], [place] [address]    Culture MODERATE CRYPTOCOCCUS NEOFORMANS  Final   Report Status 06/11/2018 FINAL  Final  Hsv Culture And Typing     Status: None   Collection Time: 06/08/18  3:23 AM  Result Value Ref Range Status   HSV Culture/Type Comment  Final    Comment: (NOTE) Negative No Herpes simplex virus isolated. Performed At: [facility] [place], [place] [id number] Rush Farmer MD [id number]    Source of Sample CSF  Final    Comment: Performed at [facility], [place] [address], [place], [place] [address]  Fungus Culture With Stain     Status: None (Preliminary result)   Collection Time: 06/08/18  3:23 AM  Result Value Ref Range Status   Fungus Stain Final report  Final   Fungus (Mycology) Culture Preliminary report  Final    Comment:  (NOTE) Performed At: [facility] [place], [place] [id number] Rush Farmer MD [id number]    Fungal Source PENDING  Incomplete  Fungus Culture Result     Status: None   Collection Time: 06/08/18  3:23 AM  Result Value Ref Range Status   Result 1 Yeast observed  Final    Comment: (NOTE) Performed At: [facility] [place], [place] [id number] Rush Farmer MD [id number]   Fungal organism reflex     Status: Abnormal   Collection Time: 06/08/18  3:23 AM  Result Value Ref Range Status   Fungal result 1 Comment (A)  Final    Comment: (NOTE) Cryptococcus neoformans Heavy growth Reported to [facility]. on [id number] at 0821, [place] faxed to [phone] Reported preliminary positive fungus culture report to Raelyn Ensign on [id number] at 0759, kmp faxed to [phone] Reported yeast identification to Marella Bile on [id number] at 1300, faxed to [phone]   kmp Performed At: [facility] [place], [place] [id number] Rush Farmer MD [id number]   Culture, blood (routine x 2)     Status: None   Collection Time: 06/09/18  2:36 PM  Result Value Ref Range Status   Specimen Description BLOOD LEFT ANTECUBITAL  Final   Special Requests   Final    BOTTLES DRAWN AEROBIC AND ANAEROBIC Blood Culture adequate volume   Culture   Final    NO GROWTH 5 DAYS Performed at [facility], [place] [address], [place], [place] [address]    Report Status 06/14/2018 FINAL  Final  Culture, blood (routine x 2)     Status: None   Collection Time: 06/09/18  2:39 PM  Result Value Ref Range Status   Specimen Description BLOOD LEFT ANTECUBITAL  Final   Special Requests   Final    BOTTLES DRAWN AEROBIC AND ANAEROBIC Blood Culture adequate volume   Culture   Final    NO GROWTH 5 DAYS Performed at [facility], [place] [address], [place], [place] [address]    Report Status 06/14/2018 FINAL  Final         Radiology Studies: No  results found.      Scheduled Meds: . dextrose  10 mL Intravenous Q24H  . dextrose  10 mL Intravenous Q24H  . feeding supplement (ENSURE ENLIVE)  237 mL Oral BID BM  . flucytosine  25 mg/kg Oral Q6H  . Influenza vac split quadrivalent PF  0.5 mL Intramuscular Tomorrow-1000  . multivitamin with minerals  1 tablet Oral Daily  . nystatin  5 mL Oral QID  . predniSONE  40 mg Oral Q breakfast   Followed by  . [START ON 06/19/2018] predniSONE  20 mg Oral Q breakfast  . sodium chloride  500 mL Intravenous Daily  . sodium chloride  500  mL Intravenous Q1200  . sulfamethoxazole-trimethoprim  2 tablet Oral Q8H  . Tdap  0.5 mL Intramuscular Once   Continuous Infusions: . sodium chloride 100 mL/hr at 06/15/18 0600  . amphotericin  B  Liposome (AMBISOME) ADULT IV 300 mg (06/15/18 0923)     LOS: 7 days   Corsica Franson Arsenio Loader, MD [facility]  If 7PM-7AM, please contact night-coverage [email or web address] 06/15/2018, 10:42 AM

## 2018-06-16 LAB — COMPREHENSIVE METABOLIC PANEL
ALT: 33 U/L (ref 0–44)
AST: 23 U/L (ref 15–41)
Albumin: 3.2 g/dL — ABNORMAL LOW (ref 3.5–5.0)
Alkaline Phosphatase: 78 U/L (ref 38–126)
Anion gap: 6 (ref 5–15)
BUN: 17 mg/dL (ref 6–20)
CO2: 25 mmol/L (ref 22–32)
Calcium: 8.4 mg/dL — ABNORMAL LOW (ref 8.9–10.3)
Chloride: 106 mmol/L (ref 98–111)
Creatinine, Ser: 0.96 mg/dL (ref 0.61–1.24)
GFR calc Af Amer: >60 mL/min (ref >60)
GFR calc non Af Amer: >60 mL/min (ref >60)
Glucose, Bld: 138 mg/dL — ABNORMAL HIGH (ref 70–99)
Potassium: 3.9 mmol/L (ref 3.5–5.1)
Sodium: 137 mmol/L (ref 135–145)
Total Bilirubin: 1 mg/dL (ref 0.3–1.2)
Total Protein: 5.8 g/dL — ABNORMAL LOW (ref 6.5–8.1)

## 2018-06-16 LAB — MAGNESIUM: Magnesium: 2.1 mg/dL (ref 1.7–2.4)

## 2018-06-16 LAB — CBC
HCT: 32 % — ABNORMAL LOW (ref 39.0–52.0)
Hemoglobin: 10.5 g/dL — ABNORMAL LOW (ref 13.0–17.0)
MCH: 27.6 pg (ref 26.0–34.0)
MCHC: 32.8 g/dL (ref 30.0–36.0)
MCV: 84 fL (ref 80.0–100.0)
Platelets: 118 10*3/uL — ABNORMAL LOW (ref 150–400)
RBC: 3.81 MIL/uL — ABNORMAL LOW (ref 4.22–5.81)
RDW: 15.4 % (ref 11.5–15.5)
WBC: 5.7 10*3/uL (ref 4.0–10.5)
nRBC: 2.3 % — ABNORMAL HIGH (ref 0.0–0.2)

## 2018-06-16 NOTE — Progress Notes (Signed)
PROGRESS NOTE    Christopher Henson  LFY:[id number] DOB: 22-Mar-1981 DOA: 06/07/2018 PCP: Patient, No Pcp Per   Brief Narrative:   38 yo male with pmhx of GERD came to the hospital with complaints of headache and oral thrush.  Patient was diagnosed with HIV and works started on treatment with nystatin.  Lumbar puncture showed elevated opening pressure.  CSF Gram stain was positive for yeast and cryptococcal titers were elevated.  Infectious disease were consulted who started patient on IV amphotericin B and flucytosine.  Eventually patient was also started on treatment for PCP pneumonia.  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  Cryptococcal meningitis with some persistent neck pain Pneumocystis pneumonia with hypoxia - CT of the head-negative, LP was consistent with elevated opening pressure of 35.  Gram stain for CSF was positive for yeast, cryptococcal titers elevated. -Fungal culture positive for cryptococcus neoformans - Continue flucytosine and amphotericin-until 3/14 - Continue Bactrim and steroid for total of 21 days -After these treatment, will be most likely started on ART. Caution with IRIS -Appreciate infectious disease following.  Due to persistent neck pain, would patient benefit from repeat LP?. - UA is clear.  Blood cultures thus far is negative -Supportive care. -Hepatitis B and hepatitis C-negative, RPR-nonreactive  Per ID: Hepatitis A/B vaccine series, Pneumovax prior to discharge, Tdap.  Moderate protein calorie malnutrition -Supplements have been ordered.  DVT prophylaxis: SCDs Code Status: Full code Family Communication: None at bedside Disposition Plan: Maintain hospital stay for IV antibiotic treatment  Consultants:   Infectious disease  Antibiotics: -Amphotericin/flucytosine-day 9 -Bactrim/prednisone-day 5  Subjective: Still  reports of some headache and neck ache.  Denies any other complaints.  Review of Systems Otherwise negative except as per HPI, including: General = no fevers, chills, dizziness, malaise, fatigue HEENT/EYES = negative for pain, redness, loss of vision, double vision, blurred vision, loss of hearing, sore throat, hoarseness, dysphagia Cardiovascular= negative for chest pain, palpitation, murmurs, lower extremity swelling Respiratory/lungs= negative for shortness of breath, cough, hemoptysis, wheezing, mucus production Gastrointestinal= negative for nausea, vomiting,, abdominal pain, melena, hematemesis Genitourinary= negative for Dysuria, Hematuria, Change in Urinary Frequency MSK = Negative for arthralgia, myalgias, Back Pain, Joint swelling  Neurology= Negative forseizures, numbness, tingling  Psychiatry= Negative for anxiety, depression, suicidal and homocidal ideation Allergy/Immunology= Medication/Food allergy as listed  Skin= Negative for Rash, lesions, ulcers, itching  Objective: Vitals:   06/15/18 0510 06/15/18 1441 06/15/18 2113 06/16/18 0535  BP: 115/81 125/83 117/77 112/71  Pulse: 94 100 97 96  Resp: 20 18 20 20   Temp: 98.1 F (36.7 C) 98.5 F (36.9 C) 98.7 F (37.1 C) 98.8 F (37.1 C)  TempSrc: Oral Oral Oral Oral  SpO2: 96% 99% 100% 100%  Weight:      Height:        Intake/Output Summary (Last 24 hours) at 06/16/2018 0946 Last data filed at 06/16/2018 0500 Gross per 24 hour  Intake 2878.7 ml  Output 2350 ml  Net 528.7 ml   Filed Weights   06/07/18 2316 06/14/18 1301  Weight: 72.6 kg 65.1 kg    Examination: Constitutional: NAD, calm, comfortable Eyes: PERRL, lids and conjunctivae normal ENMT: Mucous membranes are moist. Posterior pharynx clear of any exudate or lesions.Normal dentition.  Neck: normal, supple, no masses, no thyromegaly Respiratory: clear to auscultation bilaterally, no wheezing, no crackles. Normal respiratory effort.  No accessory muscle use.    Cardiovascular: Regular rate and rhythm, no murmurs / rubs / gallops. No extremity edema. 2+ pedal pulses. No carotid bruits.  Abdomen: no tenderness, no masses palpated. No hepatosplenomegaly. Bowel sounds positive.  Musculoskeletal: no clubbing / cyanosis. No joint deformity upper and lower extremities. Good ROM, no contractures. Normal muscle tone.  Skin: no rashes, lesions, ulcers. No induration Neurologic: CN 2-12 grossly intact. Sensation intact, DTR normal. Strength 5/5 in all 4.  Psychiatric: Normal judgment and insight. Alert and oriented x 3. Normal mood.    Data Reviewed:   CBC: Recent Labs  Lab 06/12/18 0554 06/13/18 0529 06/14/18 0521 06/15/18 0454 06/16/18 0517  WBC 3.5* 4.4 6.3 7.4 5.7  HGB 12.0* 11.8* 11.6* 10.6* 10.5*  HCT 37.0* 35.1* 35.8* 33.1* 32.0*  MCV 85.8 83.4 85.0 85.3 84.0  PLT 133* 125* 145* 134* 118*   Basic Metabolic Panel: Recent Labs  Lab 06/12/18 0554 06/13/18 0529 06/14/18 0521 06/15/18 0454 06/16/18 0517  NA 137 138 138 137 137  K 4.5 4.6 4.5 3.7 3.9  CL 106 108 106 107 106  CO2 GLUCOSE 124* 178* 195* 200* 138*  BUN CREATININE 0.76 0.87 0.90 0.97 0.96  CALCIUM 8.6* 9.1 9.1 8.4* 8.4*  MG 2.1 2.1 2.1 2.0 2.1   GFR: Estimated Creatinine Clearance: 94.2 mL/min (by C-G formula based on SCr of 0.96 mg/dL). Liver Function Tests: Recent Labs  Lab 06/13/18 0529 06/14/18 0521 06/15/18 0454 06/16/18 0517  AST ALT 31 31 34 33  ALKPHOS 90 98 91 78  BILITOT 0.5 0.6 0.9 1.0  PROT 6.1* 6.4* 5.6* 5.8*  ALBUMIN 3.1* 3.3* 3.1* 3.2*   No results for input(s): LIPASE, AMYLASE in the last 168 hours. No results for input(s): AMMONIA in the last 168 hours. Coagulation Profile: No results for input(s): INR, PROTIME in the last 168 hours. Cardiac Enzymes: No results for input(s): CKTOTAL, CKMB, CKMBINDEX, TROPONINI in the last 168 hours. BNP (last 3 results) No results for input(s): PROBNP in the  last 8760 hours. HbA1C: No results for input(s): HGBA1C in the last 72 hours. CBG: No results for input(s): GLUCAP in the last 168 hours. Lipid Profile: No results for input(s): CHOL, HDL, LDLCALC, TRIG, CHOLHDL, LDLDIRECT in the last 72 hours. Thyroid Function Tests: No results for input(s): TSH, T4TOTAL, FREET4, T3FREE, THYROIDAB in the last 72 hours. Anemia Panel: No results for input(s): VITAMINB12, FOLATE, FERRITIN, TIBC, IRON, RETICCTPCT in the last 72 hours. Sepsis Labs: No results for input(s): PROCALCITON, LATICACIDVEN in the last 168 hours.  Recent Results (from the past 240 hour(s))  CSF culture     Status: None   Collection Time: 06/08/18  1:57 AM  Result Value Ref Range Status   Specimen Description   Final    BACK Performed at [facility], [address] [place], [place] [address]    Special Requests   Final    Immunocompromised Performed at [facility], [address], [place], [place] [address]    Gram Stain   Final    NO WBC SEEN YEAST CRITICAL RESULT CALLED TO, READ BACK BY AND VERIFIED WITH: Kerry Kass RN, AT [phone] 06/08/18 BY Renato Shin Performed at [facility], [address] [place], [place] [address]    Culture MODERATE CRYPTOCOCCUS NEOFORMANS  Final   Report Status 06/11/2018 FINAL  Final  Hsv Culture  And Typing     Status: None   Collection Time: 06/08/18  3:23 AM  Result Value Ref Range Status   HSV Culture/Type Comment  Final    Comment: (NOTE) Negative No Herpes simplex virus isolated. Performed At: [facility] [address] [place], [place] [id number] Jolene Schimke MD [id number]    Source of Sample CSF  Final    Comment: Performed at [facility], [address], [place], [place] [address]  Fungus Culture With Stain     Status: None (Preliminary result)   Collection Time: 06/08/18  3:23 AM  Result Value Ref Range Status   Fungus Stain Final report  Final    Fungus (Mycology) Culture Preliminary report  Final    Comment: (NOTE) Performed At: [facility] [address] [place], [place] [id number] Jolene Schimke MD [id number]    Fungal Source PENDING  Incomplete  Fungus Culture Result     Status: None   Collection Time: 06/08/18  3:23 AM  Result Value Ref Range Status   Result 1 Yeast observed  Final    Comment: (NOTE) Performed At: [facility] [address] [place], [place] [id number] Jolene Schimke MD [id number]   Fungal organism reflex     Status: Abnormal   Collection Time: 06/08/18  3:23 AM  Result Value Ref Range Status   Fungal result 1 Comment (A)  Final    Comment: (NOTE) Cryptococcus neoformans Heavy growth Reported to [facility]. on [id number] at 0821, [place] faxed to [phone] Reported preliminary positive fungus culture report to Patrina Levering on [id number] at 0759, kmp faxed to [phone] Reported yeast identification to Campbell Lerner on [id number] at 1300, faxed to [phone]   kmp Performed At: [facility] [address] [place], [place] [id number] Jolene Schimke MD [id number]   Culture, blood (routine x 2)     Status: None   Collection Time: 06/09/18  2:36 PM  Result Value Ref Range Status   Specimen Description BLOOD LEFT ANTECUBITAL  Final   Special Requests   Final    BOTTLES DRAWN AEROBIC AND ANAEROBIC Blood Culture adequate volume   Culture   Final    NO GROWTH 5 DAYS Performed at [facility], [address], [place], [place] [address]    Report Status 06/14/2018 FINAL  Final  Culture, blood (routine x 2)     Status: None   Collection Time: 06/09/18  2:39 PM  Result Value Ref Range Status   Specimen Description BLOOD LEFT ANTECUBITAL  Final   Special Requests   Final    BOTTLES DRAWN AEROBIC AND ANAEROBIC Blood Culture adequate volume   Culture   Final    NO GROWTH 5 DAYS Performed at [facility], [address] [place], [place] [address]    Report Status  06/14/2018 FINAL  Final         Radiology Studies: No results found.      Scheduled Meds: . dextrose  10 mL Intravenous Q24H  . dextrose  10 mL Intravenous Q24H  . feeding supplement (ENSURE ENLIVE)  237 mL Oral BID BM  . flucytosine  25 mg/kg Oral Q6H  . Influenza vac split quadrivalent PF  0.5 mL Intramuscular Tomorrow-1000  . multivitamin with minerals  1 tablet Oral Daily  . nystatin  5 mL Oral QID  . predniSONE  40 mg Oral Q breakfast   Followed by  . [START ON 06/19/2018] predniSONE  20 mg Oral Q breakfast  .  sodium chloride  500 mL Intravenous Daily  . sodium chloride  500 mL Intravenous Q1200  . sulfamethoxazole-trimethoprim  2 tablet Oral Q8H  . Tdap  0.5 mL Intramuscular Once   Continuous Infusions: . sodium chloride 100 mL/hr at 06/16/18 0300  . amphotericin  B  Liposome (AMBISOME) ADULT IV 300 mg (06/15/18 0923)     LOS: 8 days   Dereck Agerton Joline Maxcy, MD [facility]  If 7PM-7AM, please contact night-coverage [email or web address] 06/16/2018, 9:46 AM

## 2018-06-17 ENCOUNTER — Inpatient Hospital Stay (HOSPITAL_COMMUNITY): Payer: BLUE CROSS/BLUE SHIELD

## 2018-06-17 LAB — CSF CELL COUNT WITH DIFFERENTIAL
RBC Count, CSF: 2 /mm3 — ABNORMAL HIGH
Tube #: 4
WBC, CSF: 7 /mm3 — ABNORMAL HIGH (ref 0–5)

## 2018-06-17 LAB — CBC
HCT: 30.6 % — ABNORMAL LOW (ref 39.0–52.0)
Hemoglobin: 10 g/dL — ABNORMAL LOW (ref 13.0–17.0)
MCH: 27.5 pg (ref 26.0–34.0)
MCHC: 32.7 g/dL (ref 30.0–36.0)
MCV: 84.3 fL (ref 80.0–100.0)
Platelets: 109 10*3/uL — ABNORMAL LOW (ref 150–400)
RBC: 3.63 MIL/uL — ABNORMAL LOW (ref 4.22–5.81)
RDW: 15.8 % — ABNORMAL HIGH (ref 11.5–15.5)
WBC: 5 10*3/uL (ref 4.0–10.5)
nRBC: 1.8 % — ABNORMAL HIGH (ref 0.0–0.2)

## 2018-06-17 LAB — COMPREHENSIVE METABOLIC PANEL
ALT: 33 U/L (ref 0–44)
AST: 24 U/L (ref 15–41)
Albumin: 3 g/dL — ABNORMAL LOW (ref 3.5–5.0)
Alkaline Phosphatase: 73 U/L (ref 38–126)
Anion gap: 5 (ref 5–15)
BUN: 16 mg/dL (ref 6–20)
CO2: 28 mmol/L (ref 22–32)
Calcium: 8.6 mg/dL — ABNORMAL LOW (ref 8.9–10.3)
Chloride: 105 mmol/L (ref 98–111)
Creatinine, Ser: 0.9 mg/dL (ref 0.61–1.24)
GFR calc Af Amer: >60 mL/min (ref >60)
GFR calc non Af Amer: >60 mL/min (ref >60)
Glucose, Bld: 109 mg/dL — ABNORMAL HIGH (ref 70–99)
Potassium: 4.1 mmol/L (ref 3.5–5.1)
Sodium: 138 mmol/L (ref 135–145)
Total Bilirubin: 0.6 mg/dL (ref 0.3–1.2)
Total Protein: 5.8 g/dL — ABNORMAL LOW (ref 6.5–8.1)

## 2018-06-17 LAB — MAGNESIUM: Magnesium: 2.1 mg/dL (ref 1.7–2.4)

## 2018-06-17 LAB — GRAM STAIN

## 2018-06-17 LAB — HLA B*5701: HLA B 5701: NEGATIVE

## 2018-06-17 NOTE — Progress Notes (Signed)
CRITICAL VALUE ALERT  Critical Value:  LP gram stain positive yeast  Date & Time Notied: 06/17/18 1700  Provider Notified: Dr. Nelson Chimes  Orders Received/Actions taken: Continue to monitor

## 2018-06-17 NOTE — Progress Notes (Signed)
PROGRESS NOTE    Christopher Henson  [id number] DOB: 04/30/1980 DOA: 06/07/2018 PCP: Patient, No Pcp Per   Brief Narrative:   38 yo male with pmhx of GERD came to the hospital with complaints of headache and oral thrush.  Patient was diagnosed with HIV and works started on treatment with nystatin.  Lumbar puncture showed elevated opening pressure.  CSF Gram stain was positive for yeast and cryptococcal titers were elevated.  Infectious disease were consulted who started patient on IV amphotericin B and flucytosine.  Eventually patient was also started on treatment for PCP pneumonia.  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  Gram stain for CSF was positive for yeast, cryptococcal titers elevated.  Repeat lumbar puncture today. -Fungal culture positive for cryptococcus neoformans - Continue flucytosine and amphotericin-until 3/14 - Continue Bactrim and steroid for total of 21 days -After these treatment, will be most likely started on ART. Caution with IRIS -Appreciate input from Dr. Johnnye Sima from infectious disease - UA is clear.  Blood cultures thus far is negative -Supportive care. -Hepatitis B and hepatitis C-negative, RPR-nonreactive  Per ID: Hepatitis A/B vaccine series, Pneumovax prior to discharge, Tdap.  Moderate protein calorie malnutrition -Supplements have been ordered.  DVT prophylaxis: SCDs Code Status: Full code Family Communication: None at bedside Disposition Plan: Maintain hospital stay for IV medication treatment and repeat lumbar puncture today.  Consultants:   Infectious disease  Antibiotics: -Amphotericin/flucytosine-day  10 -Bactrim/prednisone-day 6  Subjective: Continues to have mild headache and neck pain especially with movement.  No other complaints.  Review of Systems Otherwise negative except as per HPI, including: General = no fevers, chills, dizziness, malaise, fatigue HEENT/EYES = negative for pain, redness, loss of vision, double vision, blurred vision, loss of hearing, sore throat, hoarseness, dysphagia Cardiovascular= negative for chest pain, palpitation, murmurs, lower extremity swelling Respiratory/lungs= negative for shortness of breath, cough, hemoptysis, wheezing, mucus production Gastrointestinal= negative for nausea, vomiting,, abdominal pain, melena, hematemesis Genitourinary= negative for Dysuria, Hematuria, Change in Urinary Frequency MSK = Negative for arthralgia, myalgias, Back Pain, Joint swelling  Neurology= Negative for seizures, numbness, tingling  Psychiatry= Negative for anxiety, depression, suicidal and homocidal ideation Allergy/Immunology= Medication/Food allergy as listed  Skin= Negative for Rash, lesions, ulcers, itching   Objective: Vitals:   06/16/18 0535 06/16/18 1331 06/16/18 2116 06/17/18 0529  BP: 112/71 115/74 126/76 111/78  Pulse: 96 96 100 88  Resp: '20 16 18 18  '$ Temp: 98.8 F (37.1 C) 98.7 F (37.1 C) 99.3 F (37.4 C) 98.6 F (37 C)  TempSrc: Oral Oral Oral Oral  SpO2: 100% 98% 98% 100%  Weight:      Height:        Intake/Output Summary (Last 24 hours) at 06/17/2018 1045 Last data filed at 06/17/2018 0100 Gross per 24 hour  Intake 3077.16 ml  Output 2000 ml  Net 1077.16 ml   Filed Weights   06/07/18 2316 06/14/18 1301  Weight: 72.6 kg 65.1 kg    Examination: Constitutional: NAD, calm, comfortable Eyes: PERRL, lids and conjunctivae normal ENMT: Mucous membranes are moist. Posterior pharynx clear of any exudate or lesions.Normal dentition.  Neck: normal, supple, no masses, no thyromegaly Respiratory: clear to auscultation  bilaterally, no  wheezing, no crackles. Normal respiratory effort. No accessory muscle use.  Cardiovascular: Regular rate and rhythm, no murmurs / rubs / gallops. No extremity edema. 2+ pedal pulses. No carotid bruits.  Abdomen: no tenderness, no masses palpated. No hepatosplenomegaly. Bowel sounds positive.  Musculoskeletal: no clubbing / cyanosis. No joint deformity upper and lower extremities. Good ROM, no contractures. Normal muscle tone.  Skin: no rashes, lesions, ulcers. No induration Neurologic: CN 2-12 grossly intact. Sensation intact, DTR normal. Strength 5/5 in all 4.  Psychiatric: Normal judgment and insight. Alert and oriented x 3. Normal mood.    Data Reviewed:   CBC: Recent Labs  Lab 06/13/18 0529 06/14/18 0521 06/15/18 0454 06/16/18 0517 06/17/18 0523  WBC 4.4 6.3 7.4 5.7 5.0  HGB 11.8* 11.6* 10.6* 10.5* 10.0*  HCT 35.1* 35.8* 33.1* 32.0* 30.6*  MCV 83.4 85.0 85.3 84.0 84.3  PLT 125* 145* 134* 118* 0000000*   Basic Metabolic Panel: Recent Labs  Lab 06/13/18 0529 06/14/18 0521 06/15/18 0454 06/16/18 0517 06/17/18 0523  NA 138 138 137 137 138  K 4.6 4.5 3.7 3.9 4.1  CL 108 106 107 106 105  CO2 '23 24 22 25 28  '$ GLUCOSE 178* 195* 200* 138* 109*  BUN '17 20 20 17 16  '$ CREATININE 0.87 0.90 0.97 0.96 0.90  CALCIUM 9.1 9.1 8.4* 8.4* 8.6*  MG 2.1 2.1 2.0 2.1 2.1   GFR: Estimated Creatinine Clearance: 100.5 mL/min (by C-G formula based on SCr of 0.9 mg/dL). Liver Function Tests: Recent Labs  Lab 06/13/18 0529 06/14/18 0521 06/15/18 0454 06/16/18 0517 06/17/18 0523  AST '23 23 26 23 24  '$ ALT 31 31 34 33 33  ALKPHOS 90 98 91 78 73  BILITOT 0.5 0.6 0.9 1.0 0.6  PROT 6.1* 6.4* 5.6* 5.8* 5.8*  ALBUMIN 3.1* 3.3* 3.1* 3.2* 3.0*   No results for input(s): LIPASE, AMYLASE in the last 168 hours. No results for input(s): AMMONIA in the last 168 hours. Coagulation Profile: No results for input(s): INR, PROTIME in the last 168 hours. Cardiac Enzymes: No results for input(s): CKTOTAL,  CKMB, CKMBINDEX, TROPONINI in the last 168 hours. BNP (last 3 results) No results for input(s): PROBNP in the last 8760 hours. HbA1C: No results for input(s): HGBA1C in the last 72 hours. CBG: No results for input(s): GLUCAP in the last 168 hours. Lipid Profile: No results for input(s): CHOL, HDL, LDLCALC, TRIG, CHOLHDL, LDLDIRECT in the last 72 hours. Thyroid Function Tests: No results for input(s): TSH, T4TOTAL, FREET4, T3FREE, THYROIDAB in the last 72 hours. Anemia Panel: No results for input(s): VITAMINB12, FOLATE, FERRITIN, TIBC, IRON, RETICCTPCT in the last 72 hours. Sepsis Labs: No results for input(s): PROCALCITON, LATICACIDVEN in the last 168 hours.  Recent Results (from the past 240 hour(s))  CSF culture     Status: None   Collection Time: 06/08/18  1:57 AM  Result Value Ref Range Status   Specimen Description   Final    BACK Performed at [facility] [address] [place], [place] [address]    Special Requests   Final    Immunocompromised Performed at [facility], [place] [address] [place], [place] [address]    Gram Stain   Final    NO WBC SEEN YEAST CRITICAL RESULT CALLED TO, READ BACK BY AND VERIFIED WITH: Ephriam Knuckles RN, AT [phone] 06/08/18 BY Rush Landmark Performed at [facility], [place] [address], [place],  [address]    Culture MODERATE  CRYPTOCOCCUS NEOFORMANS  Final   Report Status 06/11/2018 FINAL  Final  Hsv Culture And Typing     Status: None   Collection Time: 06/08/18  3:23 AM  Result Value Ref Range Status   HSV Culture/Type Comment  Final    Comment: (NOTE) Negative No Herpes simplex virus isolated. Performed At: [facility] [place], [place] [id number] Rush Farmer MD [id number]    Source of Sample CSF  Final    Comment: Performed at [facility], [place] [address] [place], [place] [address]  Fungus Culture With Stain     Status: None (Preliminary result)     Collection Time: 06/08/18  3:23 AM  Result Value Ref Range Status   Fungus Stain Final report  Final   Fungus (Mycology) Culture Preliminary report  Final    Comment: (NOTE) Performed At: [facility] [place], [place] [id number] Rush Farmer MD [id number]    Fungal Source PENDING  Incomplete  Fungus Culture Result     Status: None   Collection Time: 06/08/18  3:23 AM  Result Value Ref Range Status   Result 1 Yeast observed  Final    Comment: (NOTE) Performed At: [facility] [place], [place] [id number] Rush Farmer MD [id number]   Fungal organism reflex     Status: Abnormal   Collection Time: 06/08/18  3:23 AM  Result Value Ref Range Status   Fungal result 1 Comment (A)  Final    Comment: (NOTE) Cryptococcus neoformans Heavy growth Reported to [facility]. on [id number] at 0821, [place] faxed to [phone] Reported preliminary positive fungus culture report to Raelyn Ensign on [id number] at 0759, kmp faxed to [phone] Reported yeast identification to Marella Bile on [id number] at 1300, faxed to [phone]   kmp Performed At: [facility] [place], [place] [id number] Rush Farmer MD [id number]   Culture, blood (routine x 2)     Status: None   Collection Time: 06/09/18  2:36 PM  Result Value Ref Range Status   Specimen Description BLOOD LEFT ANTECUBITAL  Final   Special Requests   Final    BOTTLES DRAWN AEROBIC AND ANAEROBIC Blood Culture adequate volume   Culture   Final    NO GROWTH 5 DAYS Performed at [facility], [place] [address], [place], [place] [address]    Report Status 06/14/2018 FINAL  Final  Culture, blood (routine x 2)     Status: None   Collection Time: 06/09/18  2:39 PM  Result Value Ref Range Status   Specimen Description BLOOD LEFT ANTECUBITAL  Final   Special Requests   Final    BOTTLES DRAWN AEROBIC AND ANAEROBIC Blood Culture adequate volume   Culture   Final    NO  GROWTH 5 DAYS Performed at [facility], [place] [address], [place], [place] [address]    Report Status 06/14/2018 FINAL  Final         Radiology Studies: No results found.      Scheduled Meds: . dextrose  10 mL Intravenous Q24H  . dextrose  10 mL Intravenous Q24H  . feeding supplement (ENSURE ENLIVE)  237 mL Oral BID BM  . flucytosine  25 mg/kg Oral Q6H  . Influenza vac split quadrivalent PF  0.5 mL Intramuscular Tomorrow-1000  . multivitamin with minerals  1 tablet Oral Daily  . nystatin  5 mL Oral QID  . predniSONE  40 mg Oral Q breakfast  Followed by  . [START ON 06/19/2018] predniSONE  20 mg Oral Q breakfast  . sodium chloride  500 mL Intravenous Daily  . sodium chloride  500 mL Intravenous Q1200  . sulfamethoxazole-trimethoprim  2 tablet Oral Q8H  . Tdap  0.5 mL Intramuscular Once   Continuous Infusions: . sodium chloride Stopped (06/17/18 1027)  . amphotericin  B  Liposome (AMBISOME) ADULT IV 300 mg (06/17/18 1026)     LOS: 9 days   Soo Steelman Arsenio Loader, MD [facility]  If 7PM-7AM, please contact night-coverage [email or web address] 06/17/2018, 10:45 AM

## 2018-06-17 NOTE — Progress Notes (Signed)
INFECTIOUS DISEASE PROGRESS NOTE  ID: Christopher Henson is a 38 y.o. male with  Active Problems:   Headache   Protein-calorie malnutrition, severe   AIDS ([facility])   Thrush   Cryptococcal meningitis ([facility])  Subjective: Worsening headaches. Trying to lay flat after LP  Abtx:  Anti-infectives (From admission, onward)   Start     Dose/Rate Route Frequency Ordered Stop   06/13/18 1000  amphotericin B liposome (AMBISOME) 300 mg in dextrose 5 % 500 mL IVPB     300 mg 250 mL/hr over 120 Minutes Intravenous Daily 06/13/18 0802     06/11/18 1800  azithromycin (ZITHROMAX) tablet 500 mg  Status:  Discontinued     500 mg Oral Daily-1800 06/11/18 1126 06/11/18 1538   06/11/18 1700  sulfamethoxazole-trimethoprim (BACTRIM DS,SEPTRA DS) 800-160 MG per tablet 2 tablet     2 tablet Oral Every 8 hours 06/11/18 1538 07/02/18 1359   06/09/18 2000  azithromycin (ZITHROMAX) 500 mg in sodium chloride 0.9 % 250 mL IVPB  Status:  Discontinued     500 mg 250 mL/hr over 60 Minutes Intravenous Every 24 hours 06/09/18 1801 06/11/18 1126   06/09/18 1830  cefTRIAXone (ROCEPHIN) 1 g in sodium chloride 0.9 % 100 mL IVPB  Status:  Discontinued     1 g 200 mL/hr over 30 Minutes Intravenous Every 24 hours 06/09/18 1801 06/11/18 1538   06/08/18 1800  vancomycin (VANCOCIN) IVPB 1000 mg/200 mL premix  Status:  Discontinued     1,000 mg 200 mL/hr over 60 Minutes Intravenous Every 8 hours 06/08/18 0723 06/08/18 1021   06/08/18 1600  fluconazole (DIFLUCAN) IVPB 200 mg  Status:  Discontinued     200 mg 100 mL/hr over 60 Minutes Intravenous Daily 06/08/18 0949 06/08/18 1021   06/08/18 1200  amphotericin B liposome (AMBISOME) 290 mg in dextrose 5 % 500 mL IVPB  Status:  Discontinued     4 mg/kg  72.6 kg 250 mL/hr over 120 Minutes Intravenous Daily 06/08/18 0858 06/13/18 0802   06/08/18 1200  flucytosine (ANCOBON) capsule 1,750 mg     25 mg/kg  72.6 kg Oral Every 6 hours 06/08/18 0858     06/08/18 0800  vancomycin (VANCOCIN)  1,500 mg in sodium chloride 0.9 % 500 mL IVPB  Status:  Discontinued     1,500 mg 250 mL/hr over 120 Minutes Intravenous  Once 06/08/18 0655 06/08/18 1021   06/08/18 0700  cefTRIAXone (ROCEPHIN) 2 g in sodium chloride 0.9 % 100 mL IVPB  Status:  Discontinued     2 g 200 mL/hr over 30 Minutes Intravenous Every 12 hours 06/08/18 0651 06/08/18 1021      Medications:  Scheduled: . dextrose  10 mL Intravenous Q24H  . dextrose  10 mL Intravenous Q24H  . feeding supplement (ENSURE ENLIVE)  237 mL Oral BID BM  . flucytosine  25 mg/kg Oral Q6H  . Influenza vac split quadrivalent PF  0.5 mL Intramuscular Tomorrow-1000  . multivitamin with minerals  1 tablet Oral Daily  . nystatin  5 mL Oral QID  . predniSONE  40 mg Oral Q breakfast   Followed by  . [START ON 06/19/2018] predniSONE  20 mg Oral Q breakfast  . sodium chloride  500 mL Intravenous Daily  . sodium chloride  500 mL Intravenous Q1200  . sulfamethoxazole-trimethoprim  2 tablet Oral Q8H  . Tdap  0.5 mL Intramuscular Once    Objective: Vital signs in last 24 hours: Temp:  [98.6 F (37 C)-99.3  F (37.4 C)] 98.6 F (37 C) (03/09 0529) Pulse Rate:  [88-100] 88 (03/09 0529) Resp:  [16-18] 18 (03/09 0529) BP: (111-126)/(74-78) 111/78 (03/09 0529) SpO2:  [98 %-100 %] 100 % (03/09 0529)   General appearance: alert, cooperative and no distress Eyes: no photophobia Throat: no thrush Neck: FROM Resp: clear to auscultation bilaterally Cardio: regular rate and rhythm GI: normal findings: bowel sounds normal and soft, non-tender  Lab Results Recent Labs    06/16/18 0517 06/17/18 0523  WBC 5.7 5.0  HGB 10.5* 10.0*  HCT 32.0* 30.6*  NA 137 138  K 3.9 4.1  CL 106 105  CO2 25 28  BUN 17 16  CREATININE 0.96 0.90   Liver Panel Recent Labs    06/16/18 0517 06/17/18 0523  PROT 5.8* 5.8*  ALBUMIN 3.2* 3.0*  AST 23 24  ALT 33 33  ALKPHOS 78 73  BILITOT 1.0 0.6   Sedimentation Rate No results for input(s): ESRSEDRATE in  the last 72 hours. C-Reactive Protein No results for input(s): CRP in the last 72 hours.  Microbiology: Recent Results (from the past 240 hour(s))  CSF culture     Status: None   Collection Time: 06/08/18  1:57 AM  Result Value Ref Range Status   Specimen Description   Final    BACK Performed at [facility], [address], [place], [place] [address]    Special Requests   Final    Immunocompromised Performed at [facility], [address] [place], [place] [address]    Gram Stain   Final    NO WBC SEEN YEAST CRITICAL RESULT CALLED TO, READ BACK BY AND VERIFIED WITH: Kerry Kass RN, AT [phone] 06/08/18 BY Renato Shin Performed at [facility], [address] [place], [place] [address]    Culture MODERATE CRYPTOCOCCUS NEOFORMANS  Final   Report Status 06/11/2018 FINAL  Final  Hsv Culture And Typing     Status: None   Collection Time: 06/08/18  3:23 AM  Result Value Ref Range Status   HSV Culture/Type Comment  Final    Comment: (NOTE) Negative No Herpes simplex virus isolated. Performed At: [facility] [address] [place], [place] [id number] Jolene Schimke MD [id number]    Source of Sample CSF  Final    Comment: Performed at [facility], [address] [place], [place] [address]  Fungus Culture With Stain     Status: None (Preliminary result)   Collection Time: 06/08/18  3:23 AM  Result Value Ref Range Status   Fungus Stain Final report  Final   Fungus (Mycology) Culture Preliminary report  Final    Comment: (NOTE) Performed At: [facility] [address] [place], [place] [id number] Jolene Schimke MD [id number]    Fungal Source PENDING  Incomplete  Fungus Culture Result     Status: None   Collection Time: 06/08/18  3:23 AM  Result Value Ref Range Status   Result 1 Yeast observed  Final    Comment: (NOTE) Performed At: [facility] [address] [place], [place]  [id number] Jolene Schimke MD [id number]   Fungal organism reflex     Status: Abnormal   Collection Time: 06/08/18  3:23 AM  Result Value Ref Range Status   Fungal result 1 Comment (A)  Final    Comment: (NOTE) Cryptococcus neoformans Heavy growth Reported to [facility]. on [id number] at 0821, [place] faxed to [phone] Reported preliminary positive fungus culture report to  Patrina LeveringNicole McCoy on [phone] at [phone], kmp faxed to [phone] Reported yeast identification to Campbell LernerSara Vanhoorne on [phone] at 1300, faxed to [phone]   kmp Performed At: [facility] [address] [place], [place] [id number] Jolene SchimkeNagendra Sanjai MD [id number]:[phone]   Culture, blood (routine x 2)     Status: None   Collection Time: 06/09/18  2:36 PM  Result Value Ref Range Status   Specimen Description BLOOD LEFT ANTECUBITAL  Final   Special Requests   Final    BOTTLES DRAWN AEROBIC AND ANAEROBIC Blood Culture adequate volume   Culture   Final    NO GROWTH 5 DAYS Performed at [facility], [address] [place], [place] [address]    Report Status 06/14/2018 FINAL  Final  Culture, blood (routine x 2)     Status: None   Collection Time: 06/09/18  2:39 PM  Result Value Ref Range Status   Specimen Description BLOOD LEFT ANTECUBITAL  Final   Special Requests   Final    BOTTLES DRAWN AEROBIC AND ANAEROBIC Blood Culture adequate volume   Culture   Final    NO GROWTH 5 DAYS Performed at [facility], [address] [place], [place] [address]    Report Status 06/14/2018 FINAL  Final    Studies/Results: No results found.   Assessment/Plan: AIDS Cryptococcal meningitis Worsening headaches Suspected PCP  Total days of antibiotics:6bactrim/prednisone 10ampho/flucytosine  Repeat LP opening pressure. 230. Much improved. Prot, cell # pending.  Cr and Magnesium stable.  Let nursing in our clinic know to alert DIS/Health Dept know to track pt, partner.    Hold ART til outpt f/u.  No change in meds for now.  Appreciate Dr  Shanda BumpsAmin's excellent care.         Johny SaxJeffrey Anushka Hartinger MD, FACP Infectious Diseases (pager) [phone] [email or web address] 06/17/2018, 1:12 PM  LOS: 9 days

## 2018-06-17 NOTE — Procedures (Signed)
Lumbar puncture performed at L2/3 without complication. OP 23 cm. Details dictated in Radiology note.

## 2018-06-18 ENCOUNTER — Inpatient Hospital Stay (HOSPITAL_COMMUNITY): Payer: BLUE CROSS/BLUE SHIELD

## 2018-06-18 DIAGNOSIS — R112 Nausea with vomiting, unspecified: Secondary | ICD-10-CM

## 2018-06-18 LAB — CBC
HCT: 32 % — ABNORMAL LOW (ref 39.0–52.0)
Hemoglobin: 10.5 g/dL — ABNORMAL LOW (ref 13.0–17.0)
MCH: 27.9 pg (ref 26.0–34.0)
MCHC: 32.8 g/dL (ref 30.0–36.0)
MCV: 85.1 fL (ref 80.0–100.0)
Platelets: 108 10*3/uL — ABNORMAL LOW (ref 150–400)
RBC: 3.76 MIL/uL — ABNORMAL LOW (ref 4.22–5.81)
RDW: 16.3 % — ABNORMAL HIGH (ref 11.5–15.5)
WBC: 3.8 10*3/uL — ABNORMAL LOW (ref 4.0–10.5)
nRBC: 1.1 % — ABNORMAL HIGH (ref 0.0–0.2)

## 2018-06-18 LAB — COMPREHENSIVE METABOLIC PANEL
ALT: 39 U/L (ref 0–44)
AST: 31 U/L (ref 15–41)
Albumin: 3.5 g/dL (ref 3.5–5.0)
Alkaline Phosphatase: 79 U/L (ref 38–126)
Anion gap: 11 (ref 5–15)
BUN: 15 mg/dL (ref 6–20)
CO2: 26 mmol/L (ref 22–32)
Calcium: 9.1 mg/dL (ref 8.9–10.3)
Chloride: 99 mmol/L (ref 98–111)
Creatinine, Ser: 0.95 mg/dL (ref 0.61–1.24)
GFR calc Af Amer: >60 mL/min (ref >60)
GFR calc non Af Amer: >60 mL/min (ref >60)
Glucose, Bld: 130 mg/dL — ABNORMAL HIGH (ref 70–99)
Potassium: 3.8 mmol/L (ref 3.5–5.1)
Sodium: 136 mmol/L (ref 135–145)
Total Bilirubin: 0.8 mg/dL (ref 0.3–1.2)
Total Protein: 6.4 g/dL — ABNORMAL LOW (ref 6.5–8.1)

## 2018-06-18 LAB — MAGNESIUM: Magnesium: 2.2 mg/dL (ref 1.7–2.4)

## 2018-06-18 MED ORDER — PROMETHAZINE HCL 25 MG/ML IJ SOLN: 25.0000 mg | Freq: Four times a day (QID) | INTRAMUSCULAR | Status: AC | PRN

## 2018-06-18 NOTE — Progress Notes (Signed)
INFECTIOUS DISEASE PROGRESS NOTE  ID: Christopher Henson is a 38 y.o. male with  Active Problems:   Headache   Protein-calorie malnutrition, severe   AIDS ([place])   Thrush   Cryptococcal meningitis ([place])  Subjective: C/o n/v.   Abtx:  Anti-infectives (From admission, onward)   Start     Dose/Rate Route Frequency Ordered Stop   06/13/18 1000  amphotericin B liposome (AMBISOME) 300 mg in dextrose 5 % 500 mL IVPB     300 mg 250 mL/hr over 120 Minutes Intravenous Daily 06/13/18 0802     06/11/18 1800  azithromycin (ZITHROMAX) tablet 500 mg  Status:  Discontinued     500 mg Oral Daily-1800 06/11/18 1126 06/11/18 1538   06/11/18 1700  sulfamethoxazole-trimethoprim (BACTRIM DS,SEPTRA DS) 800-160 MG per tablet 2 tablet     2 tablet Oral Every 8 hours 06/11/18 1538 07/02/18 1359   06/09/18 2000  azithromycin (ZITHROMAX) 500 mg in sodium chloride 0.9 % 250 mL IVPB  Status:  Discontinued     500 mg 250 mL/hr over 60 Minutes Intravenous Every 24 hours 06/09/18 1801 06/11/18 1126   06/09/18 1830  cefTRIAXone (ROCEPHIN) 1 g in sodium chloride 0.9 % 100 mL IVPB  Status:  Discontinued     1 g 200 mL/hr over 30 Minutes Intravenous Every 24 hours 06/09/18 1801 06/11/18 1538   06/08/18 1800  vancomycin (VANCOCIN) IVPB 1000 mg/200 mL premix  Status:  Discontinued     1,000 mg 200 mL/hr over 60 Minutes Intravenous Every 8 hours 06/08/18 0723 06/08/18 1021   06/08/18 1600  fluconazole (DIFLUCAN) IVPB 200 mg  Status:  Discontinued     200 mg 100 mL/hr over 60 Minutes Intravenous Daily 06/08/18 0949 06/08/18 1021   06/08/18 1200  amphotericin B liposome (AMBISOME) 290 mg in dextrose 5 % 500 mL IVPB  Status:  Discontinued     4 mg/kg  72.6 kg 250 mL/hr over 120 Minutes Intravenous Daily 06/08/18 0858 06/13/18 0802   06/08/18 1200  flucytosine (ANCOBON) capsule 1,750 mg     25 mg/kg  72.6 kg Oral Every 6 hours 06/08/18 0858     06/08/18 0800  vancomycin (VANCOCIN) 1,500 mg in sodium chloride 0.9 % 500 mL  IVPB  Status:  Discontinued     1,500 mg 250 mL/hr over 120 Minutes Intravenous  Once 06/08/18 0655 06/08/18 1021   06/08/18 0700  cefTRIAXone (ROCEPHIN) 2 g in sodium chloride 0.9 % 100 mL IVPB  Status:  Discontinued     2 g 200 mL/hr over 30 Minutes Intravenous Every 12 hours 06/08/18 0651 06/08/18 1021      Medications:  Scheduled: . dextrose  10 mL Intravenous Q24H  . dextrose  10 mL Intravenous Q24H  . feeding supplement (ENSURE ENLIVE)  237 mL Oral BID BM  . flucytosine  25 mg/kg Oral Q6H  . multivitamin with minerals  1 tablet Oral Daily  . nystatin  5 mL Oral QID  . [START ON 06/19/2018] predniSONE  20 mg Oral Q breakfast  . sodium chloride  500 mL Intravenous Daily  . sodium chloride  500 mL Intravenous Q1200  . sulfamethoxazole-trimethoprim  2 tablet Oral Q8H    Objective: Vital signs in last 24 hours: Temp:  [97.7 F (36.5 C)-99.2 F (37.3 C)] 98.9 F (37.2 C) (03/10 1346) Pulse Rate:  [79-107] 85 (03/10 1346) Resp:  [14-19] 14 (03/10 1346) BP: (109-134)/(69-72) 112/72 (03/10 1346) SpO2:  [97 %-100 %] 98 % (03/10 1346)  General appearance: alert, cooperative and no distress Resp: clear to auscultation bilaterally Cardio: regular rate and rhythm GI: normal findings: bowel sounds normal and soft, non-tender Extremities: edema none  Lab Results Recent Labs    06/17/18 0523 06/18/18 0543  WBC 5.0 3.8*  HGB 10.0* 10.5*  HCT 30.6* 32.0*  NA 138 136  K 4.1 3.8  CL 105 99  CO2 28 26  BUN 16 15  CREATININE 0.90 0.95   Liver Panel Recent Labs    06/17/18 0523 06/18/18 0543  PROT 5.8* 6.4*  ALBUMIN 3.0* 3.5  AST 24 31  ALT 33 39  ALKPHOS 73 79  BILITOT 0.6 0.8   Sedimentation Rate No results for input(s): ESRSEDRATE in the last 72 hours. C-Reactive Protein No results for input(s): CRP in the last 72 hours.  Microbiology: Recent Results (from the past 240 hour(s))  Culture, blood (routine x 2)     Status: None   Collection Time: 06/09/18   2:36 PM  Result Value Ref Range Status   Specimen Description BLOOD LEFT ANTECUBITAL  Final   Special Requests   Final    BOTTLES DRAWN AEROBIC AND ANAEROBIC Blood Culture adequate volume   Culture   Final    NO GROWTH 5 DAYS Performed at [facility], [address], [place], [place] [address]    Report Status 06/14/2018 FINAL  Final  Culture, blood (routine x 2)     Status: None   Collection Time: 06/09/18  2:39 PM  Result Value Ref Range Status   Specimen Description BLOOD LEFT ANTECUBITAL  Final   Special Requests   Final    BOTTLES DRAWN AEROBIC AND ANAEROBIC Blood Culture adequate volume   Culture   Final    NO GROWTH 5 DAYS Performed at [facility], [place] [address], [place], [place] [address]    Report Status 06/14/2018 FINAL  Final  Gram stain     Status: None   Collection Time: 06/17/18  1:36 PM  Result Value Ref Range Status   Specimen Description CSF  Final   Special Requests NONE  Final   Gram Stain   Final    WBC PRESENT, PREDOMINANTLY MONONUCLEAR YEAST CYTOSPIN SMEAR CRITICAL RESULT CALLED TO, READ BACK BY AND VERIFIED WITH: M.LONG AT 1703 ON 06/17/18 BY N.THOMPSON Performed at [facility], [place] [address], [place], [place] [address]    Report Status 06/17/2018 FINAL  Final    Studies/Results: Dg Fluoro Guide Lumbar Puncture  Result Date: 06/17/2018 CLINICAL DATA:  New diagnosis of AIDS and cryptococcal meningitis with elevated opening pressure on lumbar puncture performed 06/07/2018. EXAM: DIAGNOSTIC LUMBAR PUNCTURE UNDER FLUOROSCOPIC GUIDANCE FLUOROSCOPY TIME:  Fluoroscopy Time: 6 seconds of low-dose pulsed fluoro Radiation Exposure Index (if provided by the fluoroscopic device): 1.1 mGy Number of Acquired Spot Images: 0 PROCEDURE: Time out procedure was performed. Informed consent was obtained from the patient prior to the procedure, including potential complications of headache, allergy, and pain. With the patient prone, the lower  back was prepped with Betadine. 1% Lidocaine was used for local anesthesia. Lumbar puncture was performed at the L2-3 level using a 20 gauge needle with return of clear, colorless CSF with an opening pressure of 23 cm water (measured in the lateral decubitus position). 14 ml of CSF were obtained for laboratory studies. The patient tolerated the procedure well and there were no apparent complications. IMPRESSION: 1. Diagnostic lumbar puncture performed without immediate complication. 2. CSF opening pressure has  slightly decreased compared with the prior study, now measuring 23 cm. Electronically Signed   By: Richardean Sale M.D.   On: 06/17/2018 14:16     Assessment/Plan: AIDS Cryptococcal meningitis Worsening headaches Suspected PCP  Total days of antibiotics:7bactrim/prednisone 11ampho/flucytosine  Cr stable on ampho WBC down, could be from flucytosine. Will continue to trend.  3 more days then can d/c with po fluconazole '400mg'$  daily.  21 total days of bactrim and prednisone, then 1 daily til CD4 > 200 He received azithro on adm. Would give him azithro 1.2g po weekly at d/c (hold for now due n/v) F/u in ID clinic with me 07-03-18 at [place] Will follow peripherally.          Bobby Rumpf MD, FACP Infectious Diseases (pager) [phone] [email or web address] 06/18/2018, 2:56 PM  LOS: 10 days

## 2018-06-18 NOTE — Progress Notes (Signed)
PROGRESS NOTE    Christopher Henson  KLK:[id number] DOB: 01-May-1980 DOA: 06/07/2018 PCP: Patient, No Pcp Per   Brief Narrative:   38 yo male with pmhx of GERD came to the hospital with complaints of headache and oral thrush.  Patient was diagnosed with HIV and works started on treatment with nystatin.  Lumbar puncture showed elevated opening pressure.  CSF Gram stain was positive for yeast and cryptococcal titers were elevated.  Infectious disease were consulted who started patient on IV amphotericin B and flucytosine.  Eventually patient was also started on treatment for PCP pneumonia. Repeat LP performed 3/9, opening pressure improved.  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  Gram stain for CSF was positive for yeast, cryptococcal titers elevated.  Repeat lumbar puncture 3/9-improvement in opening pressure of 23 -Fungal culture positive for cryptococcus neoformans - Continue flucytosine and amphotericin-until 3/14 - Continue Bactrim and steroid -day 6/21 -After this he will have to be on ART. Caution with IRIS -Appreciate input from Dr. Ninetta Lights from infectious disease - UA is clear.  Blood cultures thus far is negative -Supportive care. -Hepatitis B and hepatitis C-negative, RPR-nonreactive  Per ID: Hepatitis A/B vaccine series, Pneumovax prior to discharge, Tdap.  Moderate protein calorie malnutrition -Continue supportive care  DVT prophylaxis: SCDs Code Status: Full code Family Communication: None at bedside Disposition Plan: Maintain in hospital stay for IV antibiotics and closer monitoring Consultants:   Infectious  disease  Antibiotics: -Amphotericin/flucytosine-day 11 -Bactrim/prednisone-day 7  Subjective: Continues to have mild generalized headache but no other new complaints  Review of Systems Otherwise negative except as per HPI, including: General = no fevers, chills, dizziness, malaise, fatigue HEENT/EYES = negative for pain, redness, loss of vision, double vision, blurred vision, loss of hearing, sore throat, hoarseness, dysphagia Cardiovascular= negative for chest pain, palpitation, murmurs, lower extremity swelling Respiratory/lungs= negative for shortness of breath, cough, hemoptysis, wheezing, mucus production Gastrointestinal= negative for nausea, vomiting,, abdominal pain, melena, hematemesis Genitourinary= negative for Dysuria, Hematuria, Change in Urinary Frequency MSK = Negative for arthralgia, myalgias, Back Pain, Joint swelling  Neurology= Negative for headache, seizures, numbness, tingling  Psychiatry= Negative for anxiety, depression, suicidal and homocidal ideation Allergy/Immunology= Medication/Food allergy as listed  Skin= Negative for Rash, lesions, ulcers, itching    Objective: Vitals:   06/17/18 2110 06/18/18 0101 06/18/18 0519 06/18/18 0615  BP: 110/71  109/69 134/70  Pulse: 79  (!) 107 83  Resp: 18  18 15   Temp: 98 F (36.7 C) 99 F (37.2 C) 99.2 F (37.3 C) 97.7 F (36.5 C)  TempSrc: Oral Oral Oral Oral  SpO2: 99%  98% 97%  Weight:      Height:        Intake/Output Summary (Last 24 hours) at 06/18/2018 1132 Last data filed at 06/18/2018 1032 Gross per 24 hour  Intake 120 ml  Output 1800 ml  Net -1680 ml   Filed Weights   06/07/18 2316 06/14/18 1301  Weight: 72.6 kg 65.1 kg    Examination: Constitutional: NAD, calm, comfortable Eyes: PERRL, lids and conjunctivae normal ENMT: Mucous membranes are moist. Posterior pharynx clear of any exudate or lesions.Normal dentition.  Neck: normal, supple, no masses, no thyromegaly  Respiratory: clear to  auscultation bilaterally, no wheezing, no crackles. Normal respiratory effort. No accessory muscle use.  Cardiovascular: Regular rate and rhythm, no murmurs / rubs / gallops. No extremity edema. 2+ pedal pulses. No carotid bruits.  Abdomen: no tenderness, no masses palpated. No hepatosplenomegaly. Bowel sounds positive.  Musculoskeletal: no clubbing / cyanosis. No joint deformity upper and lower extremities. Good ROM, no contractures. Normal muscle tone.  Skin: no rashes, lesions, ulcers. No induration Neurologic: CN 2-12 grossly intact. Sensation intact, DTR normal. Strength 5/5 in all 4.  Psychiatric: Normal judgment and insight. Alert and oriented x 3. Normal mood.    Data Reviewed:   CBC: Recent Labs  Lab 06/14/18 0521 06/15/18 0454 06/16/18 0517 06/17/18 0523 06/18/18 0543  WBC 6.3 7.4 5.7 5.0 3.8*  HGB 11.6* 10.6* 10.5* 10.0* 10.5*  HCT 35.8* 33.1* 32.0* 30.6* 32.0*  MCV 85.0 85.3 84.0 84.3 85.1  PLT 145* 134* 118* 109* 108*   Basic Metabolic Panel: Recent Labs  Lab 06/14/18 0521 06/15/18 0454 06/16/18 0517 06/17/18 0523 06/18/18 0543  NA 138 137 137 138 136  K 4.5 3.7 3.9 4.1 3.8  CL 106 107 106 105 99  CO2 GLUCOSE 195* 200* 138* 109* 130*  BUN CREATININE 0.90 0.97 0.96 0.90 0.95  CALCIUM 9.1 8.4* 8.4* 8.6* 9.1  MG 2.1 2.0 2.1 2.1 2.2   GFR: Estimated Creatinine Clearance: 95.2 mL/min (by C-G formula based on SCr of 0.95 mg/dL). Liver Function Tests: Recent Labs  Lab 06/14/18 0521 06/15/18 0454 06/16/18 0517 06/17/18 0523 06/18/18 0543  AST ALT 31 34 33 33 39  ALKPHOS 98 91 78 73 79  BILITOT 0.6 0.9 1.0 0.6 0.8  PROT 6.4* 5.6* 5.8* 5.8* 6.4*  ALBUMIN 3.3* 3.1* 3.2* 3.0* 3.5   No results for input(s): LIPASE, AMYLASE in the last 168 hours. No results for input(s): AMMONIA in the last 168 hours. Coagulation Profile: No results for input(s): INR, PROTIME in the last 168 hours. Cardiac Enzymes: No  results for input(s): CKTOTAL, CKMB, CKMBINDEX, TROPONINI in the last 168 hours. BNP (last 3 results) No results for input(s): PROBNP in the last 8760 hours. HbA1C: No results for input(s): HGBA1C in the last 72 hours. CBG: No results for input(s): GLUCAP in the last 168 hours. Lipid Profile: No results for input(s): CHOL, HDL, LDLCALC, TRIG, CHOLHDL, LDLDIRECT in the last 72 hours. Thyroid Function Tests: No results for input(s): TSH, T4TOTAL, FREET4, T3FREE, THYROIDAB in the last 72 hours. Anemia Panel: No results for input(s): VITAMINB12, FOLATE, FERRITIN, TIBC, IRON, RETICCTPCT in the last 72 hours. Sepsis Labs: No results for input(s): PROCALCITON, LATICACIDVEN in the last 168 hours.  Recent Results (from the past 240 hour(s))  Culture, blood (routine x 2)     Status: None   Collection Time: 06/09/18  2:36 PM  Result Value Ref Range Status   Specimen Description BLOOD LEFT ANTECUBITAL  Final   Special Requests   Final    BOTTLES DRAWN AEROBIC AND ANAEROBIC Blood Culture adequate volume   Culture   Final    NO GROWTH 5 DAYS Performed at [facility], [address], [place], [place] [address]    Report Status 06/14/2018 FINAL  Final  Culture, blood (routine x 2)     Status: None   Collection Time: 06/09/18  2:39 PM  Result Value Ref Range Status   Specimen Description BLOOD  LEFT ANTECUBITAL  Final   Special Requests   Final    BOTTLES DRAWN AEROBIC AND ANAEROBIC Blood Culture adequate volume   Culture   Final    NO GROWTH 5 DAYS Performed at [facility], [address], [place], [place] [address]    Report Status 06/14/2018 FINAL  Final  Gram stain     Status: None   Collection Time: 06/17/18  1:36 PM  Result Value Ref Range Status   Specimen Description CSF  Final   Special Requests NONE  Final   Gram Stain   Final    WBC PRESENT, PREDOMINANTLY MONONUCLEAR YEAST CYTOSPIN SMEAR CRITICAL RESULT CALLED TO, READ BACK BY AND VERIFIED WITH: M.LONG AT 1703  ON 06/17/18 BY N.THOMPSON Performed at [facility], [address] [place], [place] [address]    Report Status 06/17/2018 FINAL  Final         Radiology Studies: Dg Fluoro Guide Lumbar Puncture  Result Date: 06/17/2018 CLINICAL DATA:  New diagnosis of AIDS and cryptococcal meningitis with elevated opening pressure on lumbar puncture performed 06/07/2018. EXAM: DIAGNOSTIC LUMBAR PUNCTURE UNDER FLUOROSCOPIC GUIDANCE FLUOROSCOPY TIME:  Fluoroscopy Time: 6 seconds of low-dose pulsed fluoro Radiation Exposure Index (if provided by the fluoroscopic device): 1.1 mGy Number of Acquired Spot Images: 0 PROCEDURE: Time out procedure was performed. Informed consent was obtained from the patient prior to the procedure, including potential complications of headache, allergy, and pain. With the patient prone, the lower back was prepped with Betadine. 1% Lidocaine was used for local anesthesia. Lumbar puncture was performed at the L2-3 level using a 20 gauge needle with return of clear, colorless CSF with an opening pressure of 23 cm water (measured in the lateral decubitus position). 14 ml of CSF were obtained for laboratory studies. The patient tolerated the procedure well and there were no apparent complications. IMPRESSION: 1. Diagnostic lumbar puncture performed without immediate complication. 2. CSF opening pressure has slightly decreased compared with the prior study, now measuring 23 cm. Electronically Signed   By: Carey Bullocks M.D.   On: 06/17/2018 14:16        Scheduled Meds: . dextrose  10 mL Intravenous Q24H  . dextrose  10 mL Intravenous Q24H  . feeding supplement (ENSURE ENLIVE)  237 mL Oral BID BM  . flucytosine  25 mg/kg Oral Q6H  . multivitamin with minerals  1 tablet Oral Daily  . nystatin  5 mL Oral QID  . [START ON 06/19/2018] predniSONE  20 mg Oral Q breakfast  . sodium chloride  500 mL Intravenous Daily  . sodium chloride  500 mL Intravenous Q1200  .  sulfamethoxazole-trimethoprim  2 tablet Oral Q8H   Continuous Infusions: . sodium chloride 100 mL/hr at 06/18/18 0212  . amphotericin  B  Liposome (AMBISOME) ADULT IV Stopped (06/17/18 1226)     LOS: 10 days   Ankit Joline Maxcy, MD [facility]  If 7PM-7AM, please contact night-coverage [email or web address] 06/18/2018, 11:32 AM

## 2018-06-19 LAB — COMPREHENSIVE METABOLIC PANEL
ALT: 47 U/L — ABNORMAL HIGH (ref 0–44)
AST: 33 U/L (ref 15–41)
Albumin: 3.3 g/dL — ABNORMAL LOW (ref 3.5–5.0)
Alkaline Phosphatase: 73 U/L (ref 38–126)
Anion gap: 7 (ref 5–15)
BUN: 13 mg/dL (ref 6–20)
CO2: 26 mmol/L (ref 22–32)
Calcium: 8.8 mg/dL — ABNORMAL LOW (ref 8.9–10.3)
Chloride: 103 mmol/L (ref 98–111)
Creatinine, Ser: 0.85 mg/dL (ref 0.61–1.24)
GFR calc Af Amer: >60 mL/min (ref >60)
GFR calc non Af Amer: >60 mL/min (ref >60)
Glucose, Bld: 101 mg/dL — ABNORMAL HIGH (ref 70–99)
Potassium: 3.9 mmol/L (ref 3.5–5.1)
Sodium: 136 mmol/L (ref 135–145)
Total Bilirubin: 0.7 mg/dL (ref 0.3–1.2)
Total Protein: 6.2 g/dL — ABNORMAL LOW (ref 6.5–8.1)

## 2018-06-19 LAB — CBC
HCT: 31.2 % — ABNORMAL LOW (ref 39.0–52.0)
Hemoglobin: 10.3 g/dL — ABNORMAL LOW (ref 13.0–17.0)
MCH: 28.3 pg (ref 26.0–34.0)
MCHC: 33 g/dL (ref 30.0–36.0)
MCV: 85.7 fL (ref 80.0–100.0)
Platelets: 102 10*3/uL — ABNORMAL LOW (ref 150–400)
RBC: 3.64 MIL/uL — ABNORMAL LOW (ref 4.22–5.81)
RDW: 16.7 % — ABNORMAL HIGH (ref 11.5–15.5)
WBC: 3.3 10*3/uL — ABNORMAL LOW (ref 4.0–10.5)
nRBC: 0.6 % — ABNORMAL HIGH (ref 0.0–0.2)

## 2018-06-19 LAB — MAGNESIUM: Magnesium: 2.2 mg/dL (ref 1.7–2.4)

## 2018-06-19 MED ORDER — PANTOPRAZOLE SODIUM 40 MG PO TBEC
40.0000 mg | DELAYED_RELEASE_TABLET | Freq: Every day | ORAL | Status: DC
  Administered 2018-06-19 – 2018-06-21 (×3): 40 mg via ORAL
  Filled 2018-06-19 (×3): qty 1

## 2018-06-19 NOTE — Progress Notes (Signed)
PROGRESS NOTE    Christopher Henson  [id number] DOB: 1980-05-20 DOA: 06/07/2018 PCP: Patient, No Pcp Per   Brief Narrative:   38 yo male with pmhx of GERD came to the hospital with complaints of headache and oral thrush.  Patient was diagnosed with HIV and works started on treatment with nystatin.  Lumbar puncture showed elevated opening pressure.  CSF Gram stain was positive for yeast and cryptococcal titers were elevated.  Infectious disease were consulted who started patient on IV amphotericin B and flucytosine.  Eventually patient was also started on treatment for PCP pneumonia. Repeat LP performed 3/9, opening pressure improved.   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  Gram stain for CSF was positive for yeast, cryptococcal titers elevated.  Repeat lumbar puncture 3/9-improvement in opening pressure of 23 -Fungal culture positive for cryptococcus neoformans - Continue with amphotericin, and flucytosine, until 314, then can be transitioned on p.o. Diflucan 400 mg oral daily . - Continue with Bactrim and steroids, to finish total of 21 days, then once daily until his CD4 count is more than 200  -After this he will have to be on ART. Caution with IRIS -Appreciate input from Dr. Johnnye Sima from infectious disease - UA is clear.  Blood cultures thus far is negative -Supportive care. -Hepatitis B and hepatitis C-negative, RPR-nonreactive -Follow with Dr. Johnnye Sima in [facility] 07/03/2018 at 9 AM  Per ID: Hepatitis A/B vaccine series, Pneumovax prior to discharge, Tdap.  Moderate protein  calorie malnutrition -Continue supportive care  DVT prophylaxis: SCDs while he was encouraged to ambulate Code Status: Full code Family Communication: None at bedside Disposition Plan: Maintain in hospital stay for IV antibiotics and closer monitoring Consultants:   Infectious disease  Antibiotics: -Amphotericin/flucytosine-day 11 -Bactrim/prednisone-day 7       Objective: Vitals:   06/18/18 0615 06/18/18 1346 06/18/18 2049 06/19/18 0456  BP: 134/70 112/72 116/69 112/74  Pulse: 83 85 93 85  Resp: '15 14 16 16  '$ Temp: 97.7 F (36.5 C) 98.9 F (37.2 C) 99.4 F (37.4 C) 98.9 F (37.2 C)  TempSrc: Oral Oral Oral Oral  SpO2: 97% 98% 98% 98%  Weight:      Height:        Intake/Output Summary (Last 24 hours) at 06/19/2018 1152 Last data filed at 06/19/2018 0636 Gross per 24 hour  Intake --  Output 1200 ml  Net -1200 ml   Filed Weights   06/07/18 2316 06/14/18 1301  Weight: 72.6 kg 65.1 kg    Examination:  Awake Alert, Oriented X 3, frail, thin appearing  symmetrical Chest wall movement, Good air movement bilaterally, CTAB RRR,No Gallops,Rubs or new Murmurs, No Parasternal Heave +ve B.Sounds, Abd Soft, No tenderness, No rebound - guarding or rigidity. No Cyanosis, Clubbing or edema, No new Rash or bruise     Data Reviewed:   CBC: Recent Labs  Lab 06/15/18 0454 06/16/18 0517 06/17/18 0523 06/18/18 0543 06/19/18 0612  WBC 7.4 5.7 5.0 3.8* 3.3*  HGB 10.6* 10.5* 10.0* 10.5* 10.3*  HCT 33.1* 32.0* 30.6* 32.0* 31.2*  MCV 85.3 84.0 84.3 85.1 85.7  PLT  134* 118* 109* 108* [id number]*   Basic Metabolic Panel: Recent Labs  Lab 06/15/18 0454 06/16/18 0517 06/17/18 0523 06/18/18 0543 06/19/18 0612  NA 137 137 138 136 136  K 3.7 3.9 4.1 3.8 3.9  CL 107 106 105 99 103  CO2 '22 25 28 26 26  '$ GLUCOSE 200* 138* 109* 130* 101*  BUN '20 17 16 15 13  '$ CREATININE 0.97 0.96 0.90 0.95 0.85  CALCIUM 8.4* 8.4* 8.6* 9.1 8.8*  MG 2.0 2.1 2.1 2.2 2.2   GFR: Estimated  Creatinine Clearance: 106.4 mL/min (by C-G formula based on SCr of 0.85 mg/dL). Liver Function Tests: Recent Labs  Lab 06/15/18 0454 06/16/18 0517 06/17/18 0523 06/18/18 0543 06/19/18 0612  AST '26 23 24 31 '$ 33  ALT 34 33 33 39 47*  ALKPHOS 91 78 73 79 73  BILITOT 0.9 1.0 0.6 0.8 0.7  PROT 5.6* 5.8* 5.8* 6.4* 6.2*  ALBUMIN 3.1* 3.2* 3.0* 3.5 3.3*   No results for input(s): LIPASE, AMYLASE in the last 168 hours. No results for input(s): AMMONIA in the last 168 hours. Coagulation Profile: No results for input(s): INR, PROTIME in the last 168 hours. Cardiac Enzymes: No results for input(s): CKTOTAL, CKMB, CKMBINDEX, TROPONINI in the last 168 hours. BNP (last 3 results) No results for input(s): PROBNP in the last 8760 hours. HbA1C: No results for input(s): HGBA1C in the last 72 hours. CBG: No results for input(s): GLUCAP in the last 168 hours. Lipid Profile: No results for input(s): CHOL, HDL, LDLCALC, TRIG, CHOLHDL, LDLDIRECT in the last 72 hours. Thyroid Function Tests: No results for input(s): TSH, T4TOTAL, FREET4, T3FREE, THYROIDAB in the last 72 hours. Anemia Panel: No results for input(s): VITAMINB12, FOLATE, FERRITIN, TIBC, IRON, RETICCTPCT in the last 72 hours. Sepsis Labs: No results for input(s): PROCALCITON, LATICACIDVEN in the last 168 hours.  Recent Results (from the past 240 hour(s))  Culture, blood (routine x 2)     Status: None   Collection Time: 06/09/18  2:36 PM  Result Value Ref Range Status   Specimen Description BLOOD LEFT ANTECUBITAL  Final   Special Requests   Final    BOTTLES DRAWN AEROBIC AND ANAEROBIC Blood Culture adequate volume   Culture   Final    NO GROWTH 5 DAYS Performed at [facility], [address] [place], [place] [address]    Report Status 06/14/2018 FINAL  Final  Culture, blood (routine x 2)     Status: None   Collection Time: 06/09/18  2:39 PM  Result Value Ref Range Status   Specimen Description BLOOD LEFT ANTECUBITAL   Final   Special Requests   Final    BOTTLES DRAWN AEROBIC AND ANAEROBIC Blood Culture adequate volume   Culture   Final    NO GROWTH 5 DAYS Performed at [facility], [place] [address], [place], [place] [address]    Report Status 06/14/2018 FINAL  Final  Gram stain     Status: None   Collection Time: 06/17/18  1:36 PM  Result Value Ref Range Status   Specimen Description CSF  Final   Special Requests NONE  Final   Gram Stain   Final    WBC PRESENT, PREDOMINANTLY MONONUCLEAR YEAST CYTOSPIN SMEAR CRITICAL RESULT CALLED TO, READ BACK BY AND VERIFIED WITH: M.LONG AT 1703 ON 06/17/18 BY N.THOMPSON Performed at [facility], [place] [address], [place], [place] [address]    Report Status 06/17/2018 FINAL  Final  Radiology Studies: Dg Abd 1 View  Result Date: 06/18/2018 CLINICAL DATA:  Abdominal pain and distention. EXAM: ABDOMEN - 1 VIEW COMPARISON:  None. FINDINGS: The bowel gas pattern is normal. No radio-opaque calculi or other significant radiographic abnormality are seen. IMPRESSION: No evidence of bowel obstruction or ileus. Electronically Signed   By: Marijo Conception, M.D.   On: 06/18/2018 15:52   Dg Fluoro Guide Lumbar Puncture  Result Date: 06/17/2018 CLINICAL DATA:  New diagnosis of AIDS and cryptococcal meningitis with elevated opening pressure on lumbar puncture performed 06/07/2018. EXAM: DIAGNOSTIC LUMBAR PUNCTURE UNDER FLUOROSCOPIC GUIDANCE FLUOROSCOPY TIME:  Fluoroscopy Time: 6 seconds of low-dose pulsed fluoro Radiation Exposure Index (if provided by the fluoroscopic device): 1.1 mGy Number of Acquired Spot Images: 0 PROCEDURE: Time out procedure was performed. Informed consent was obtained from the patient prior to the procedure, including potential complications of headache, allergy, and pain. With the patient prone, the lower back was prepped with Betadine. 1% Lidocaine was used for local anesthesia. Lumbar puncture was performed at the L2-3 level  using a 20 gauge needle with return of clear, colorless CSF with an opening pressure of 23 cm water (measured in the lateral decubitus position). 14 ml of CSF were obtained for laboratory studies. The patient tolerated the procedure well and there were no apparent complications. IMPRESSION: 1. Diagnostic lumbar puncture performed without immediate complication. 2. CSF opening pressure has slightly decreased compared with the prior study, now measuring 23 cm. Electronically Signed   By: Richardean Sale M.D.   On: 06/17/2018 14:16        Scheduled Meds: . dextrose  10 mL Intravenous Q24H  . dextrose  10 mL Intravenous Q24H  . feeding supplement (ENSURE ENLIVE)  237 mL Oral BID BM  . flucytosine  25 mg/kg Oral Q6H  . multivitamin with minerals  1 tablet Oral Daily  . nystatin  5 mL Oral QID  . predniSONE  20 mg Oral Q breakfast  . sodium chloride  500 mL Intravenous Daily  . sodium chloride  500 mL Intravenous Q1200  . sulfamethoxazole-trimethoprim  2 tablet Oral Q8H   Continuous Infusions: . sodium chloride 100 mL/hr at 06/19/18 0636  . amphotericin  B  Liposome (AMBISOME) ADULT IV 300 mg (06/19/18 1122)     LOS: 11 days   Phillips Climes, MD [facility]  If 7PM-7AM, please contact night-coverage [email or web address] 06/19/2018, 11:52 AM

## 2018-06-20 LAB — COMPREHENSIVE METABOLIC PANEL
ALT: 54 U/L — ABNORMAL HIGH (ref 0–44)
AST: 34 U/L (ref 15–41)
Albumin: 3.5 g/dL (ref 3.5–5.0)
Alkaline Phosphatase: 78 U/L (ref 38–126)
Anion gap: 7 (ref 5–15)
BUN: 17 mg/dL (ref 6–20)
CO2: 25 mmol/L (ref 22–32)
Calcium: 8.9 mg/dL (ref 8.9–10.3)
Chloride: 106 mmol/L (ref 98–111)
Creatinine, Ser: 1.03 mg/dL (ref 0.61–1.24)
GFR calc Af Amer: >60 mL/min (ref >60)
GFR calc non Af Amer: >60 mL/min (ref >60)
Glucose, Bld: 112 mg/dL — ABNORMAL HIGH (ref 70–99)
Potassium: 4 mmol/L (ref 3.5–5.1)
Sodium: 138 mmol/L (ref 135–145)
Total Bilirubin: 0.7 mg/dL (ref 0.3–1.2)
Total Protein: 6.6 g/dL (ref 6.5–8.1)

## 2018-06-20 LAB — MAGNESIUM: Magnesium: 2.3 mg/dL (ref 1.7–2.4)

## 2018-06-20 MED ORDER — MUPIROCIN CALCIUM 2 % EX CREA
TOPICAL_CREAM | Freq: Four times a day (QID) | CUTANEOUS | Status: DC
  Administered 2018-06-20 (×3): via TOPICAL
  Filled 2018-06-20: qty 15

## 2018-06-20 MED ORDER — MAGIC MOUTHWASH
10.0000 mL | Freq: Four times a day (QID) | ORAL | Status: DC
  Administered 2018-06-20 – 2018-06-21 (×4): 10 mL via ORAL
  Filled 2018-06-20 (×6): qty 10

## 2018-06-20 MED ORDER — ENOXAPARIN SODIUM 30 MG/0.3ML ~~LOC~~ SOLN
30.0000 mg | SUBCUTANEOUS | Status: DC
  Administered 2018-06-20: 30 mg via SUBCUTANEOUS
  Filled 2018-06-20: qty 0.3

## 2018-06-20 NOTE — Progress Notes (Signed)
Nutrition Follow-up  DOCUMENTATION CODES:   Severe malnutrition in context of acute illness/injury  INTERVENTION:  - Continue Ensure Enlive BID. - Continue 2 PM snack. - Continue to encourage PO intakes.  - Weigh patient today.    NUTRITION DIAGNOSIS:   Severe Malnutrition related to acute illness(recent dx of HIV) as evidenced by mild fat depletion, moderate fat depletion, mild muscle depletion, moderate muscle depletion. -ongoing  GOAL:   Patient will meet greater than or equal to 90% of their needs -met on average  MONITOR:   PO intake, Supplement acceptance, Weight trends, Labs, I & O's  ASSESSMENT:   38 y.o. male with past medical history of alcohol abuse and GERD. Patient presented to the ED with complaints of N/V, dizziness without vertigo, and headaches x1 week. He had been seen in the ED on 2/26 and symptoms were concerning for HIV; oral thrush also present at that time. At that time, he was given nystatin and was recommended to get an HIV checkup and lumbar puncture.  No weight recorded since 3/6. Patient continues to mainly eat 100% of meals or have items brought in for him. He has been accepting Ensure Enlive ~50% of the time offered.   Per Dr. Graciela Husbands note yesterday AM: plan to continue bactrim and steroids to reach a total of 21 days, repeat LP on 3/9 showed improvement, no plan for discharge at this time.    Medications reviewed; daily multivitamin with minerals, 5 ml mycostatin QID, 20 mg deltasone/day. Labs reviewed. IVF; NS @ 100 ml/hr.    Diet Order:   Diet Order            Diet regular Room service appropriate? Yes; Fluid consistency: Thin  Diet effective now              EDUCATION NEEDS:   Education needs have been addressed  Skin:  Skin Assessment: Reviewed RN Assessment  Last BM:  3/11  Height:   Ht Readings from Last 1 Encounters:  06/07/18 6' (1.829 m)    Weight:   Wt Readings from Last 1 Encounters:  06/14/18 65.1 kg     Ideal Body Weight:  80.91 kg  BMI:  Body mass index is 19.46 kg/m.  Estimated Nutritional Needs:   Kcal:  7829-5621 kcal  Protein:  100-115 grams  Fluid:  >/= 2.2 L/day     Jarome Matin, MS, RD, LDN, [facility] Inpatient Clinical Dietitian Pager # [phone] After hours/weekend pager # [phone]

## 2018-06-20 NOTE — Progress Notes (Signed)
PROGRESS NOTE    Christopher Henson  SLH:[id number] DOB: 1980-11-16 DOA: 06/07/2018 PCP: Patient, No Pcp Per   Brief Narrative:   38 yo male with pmhx of GERD came to the hospital with complaints of headache and oral thrush.  Patient was diagnosed with HIV and works started on treatment with nystatin.  Lumbar puncture showed elevated opening pressure.  CSF Gram stain was positive for yeast and cryptococcal titers were elevated.  Infectious disease were consulted who started patient on IV amphotericin B and flucytosine.  Eventually patient was also started on treatment for PCP pneumonia. Repeat LP performed 3/9, opening pressure improved.   Subjective:  Reports he is feeling much better today, no headache, no shortness of breath, no cough  Assessment & Plan:   Active Problems:   Headache   Protein-calorie malnutrition, severe   AIDS ([facility])   Thrush   Cryptococcal meningitis ([facility])  HIV with AIDS, new Dx.  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  Gram stain for CSF was positive for yeast, cryptococcal titers elevated.  Repeat lumbar puncture 3/9-improvement in opening pressure of 23 -Fungal culture positive for cryptococcus neoformans - Continue with amphotericin, and flucytosine, until 314, then can be transitioned on p.o. Diflucan 400 mg oral daily . - Continue with Bactrim and steroids, to finish total of 21 days, then once daily until his CD4 count is more than 200  -After this he will have to be on ART. Caution with IRIS -Appreciate input from Dr. Ninetta Lights from infectious disease - UA is clear.  Blood cultures thus far is negative -Supportive care. -Hepatitis B and hepatitis C-negative, RPR-nonreactive -Follow with Dr. Ninetta Lights in ID clinic 07/03/2018 at 9 AM -He is started on Magic mouthwash with nystatin for his oral thrush  Per  ID: Hepatitis A/B vaccine series, Pneumovax prior to discharge, Tdap.  Severe protein calorie malnutrition -Nutritionist input greatly appreciated, continue with supplements   DVT prophylaxis: SCDs , [place] lovenox Code Status: Full code Family Communication: None at bedside Disposition Plan: Maintain in hospital stay for IV antibiotics and closer monitoring Consultants:   Infectious disease  Antibiotics: -Amphotericin/flucytosine-day 11 -Bactrim/prednisone-day 7       Objective: Vitals:   06/19/18 0456 06/19/18 1400 06/19/18 2122 06/20/18 0541  BP: 112/74 121/71 105/71 109/76  Pulse: 85 87 92 93  Resp: 16 18 17 18   Temp: 98.9 F (37.2 C) 98.5 F (36.9 C) 99.6 F (37.6 C) 98.7 F (37.1 C)  TempSrc: Oral Oral Oral Oral  SpO2: 98% 99% 100% 99%  Weight:      Height:        Intake/Output Summary (Last 24 hours) at 06/20/2018 1412 Last data filed at 06/20/2018 1043 Gross per 24 hour  Intake -  Output 1300 ml  Net -1300 ml   Filed Weights   06/07/18 2316 06/14/18 1301  Weight: 72.6 kg 65.1 kg    Examination:  Awake Alert, Oriented X 3, No new F.N deficits, Normal affect, extremely thin appearing He still has some oral thrush Symmetrical Chest wall movement, Good air movement bilaterally, CTAB RRR,No Gallops,Rubs or new Murmurs, No Parasternal Heave +ve B.Sounds, Abd Soft, No tenderness, No rebound - guarding or rigidity. No Cyanosis, Clubbing or edema, No new Rash or bruise      Data Reviewed:   CBC: Recent Labs  Lab 06/15/18 0454 06/16/18 0517 06/17/18 0523 06/18/18 0543  06/19/18 0612  WBC 7.4 5.7 5.0 3.8* 3.3*  HGB 10.6* 10.5* 10.0* 10.5* 10.3*  HCT 33.1* 32.0* 30.6* 32.0* 31.2*  MCV 85.3 84.0 84.3 85.1 85.7  PLT 134* 118* 109* 108* 102*   Basic Metabolic Panel: Recent Labs  Lab 06/16/18 0517 06/17/18 0523 06/18/18 0543 06/19/18 0612 06/20/18 0553  NA 137 138 136 136 138  K 3.9 4.1 3.8 3.9 4.0  CL 106 105 99 103 106  CO2 25 28 26 26 25    GLUCOSE 138* 109* 130* 101* 112*  BUN 17 16 15 13 17   CREATININE 0.96 0.90 0.95 0.85 1.03  CALCIUM 8.4* 8.6* 9.1 8.8* 8.9  MG 2.1 2.1 2.2 2.2 2.3   GFR: Estimated Creatinine Clearance: 87.8 mL/min (by C-G formula based on SCr of 1.03 mg/dL). Liver Function Tests: Recent Labs  Lab 06/16/18 0517 06/17/18 0523 06/18/18 0543 06/19/18 0612 06/20/18 0553  AST 23 24 31  33 34  ALT 33 33 39 47* 54*  ALKPHOS 78 73 79 73 78  BILITOT 1.0 0.6 0.8 0.7 0.7  PROT 5.8* 5.8* 6.4* 6.2* 6.6  ALBUMIN 3.2* 3.0* 3.5 3.3* 3.5   No results for input(s): LIPASE, AMYLASE in the last 168 hours. No results for input(s): AMMONIA in the last 168 hours. Coagulation Profile: No results for input(s): INR, PROTIME in the last 168 hours. Cardiac Enzymes: No results for input(s): CKTOTAL, CKMB, CKMBINDEX, TROPONINI in the last 168 hours. BNP (last 3 results) No results for input(s): PROBNP in the last 8760 hours. HbA1C: No results for input(s): HGBA1C in the last 72 hours. CBG: No results for input(s): GLUCAP in the last 168 hours. Lipid Profile: No results for input(s): CHOL, HDL, LDLCALC, TRIG, CHOLHDL, LDLDIRECT in the last 72 hours. Thyroid Function Tests: No results for input(s): TSH, T4TOTAL, FREET4, T3FREE, THYROIDAB in the last 72 hours. Anemia Panel: No results for input(s): VITAMINB12, FOLATE, FERRITIN, TIBC, IRON, RETICCTPCT in the last 72 hours. Sepsis Labs: No results for input(s): PROCALCITON, LATICACIDVEN in the last 168 hours.  Recent Results (from the past 240 hour(s))  Gram stain     Status: None   Collection Time: 06/17/18  1:36 PM  Result Value Ref Range Status   Specimen Description CSF  Final   Special Requests NONE  Final   Gram Stain   Final    WBC PRESENT, PREDOMINANTLY MONONUCLEAR YEAST CYTOSPIN SMEAR CRITICAL RESULT CALLED TO, READ BACK BY AND VERIFIED WITH: M.LONG AT 1703 ON 06/17/18 BY N.THOMPSON Performed at [facility], [address]  [place], [place] [address]    Report Status 06/17/2018 FINAL  Final         Radiology Studies: Dg Abd 1 View  Result Date: 06/18/2018 CLINICAL DATA:  Abdominal pain and distention. EXAM: ABDOMEN - 1 VIEW COMPARISON:  None. FINDINGS: The bowel gas pattern is normal. No radio-opaque calculi or other significant radiographic abnormality are seen. IMPRESSION: No evidence of bowel obstruction or ileus. Electronically Signed   By: Lupita RaiderJames  Green Jr, M.D.   On: 06/18/2018 15:52        Scheduled Meds: . dextrose  10 mL Intravenous Q24H  . dextrose  10 mL Intravenous Q24H  . feeding supplement (ENSURE ENLIVE)  237 mL Oral BID BM  . flucytosine  25 mg/kg Oral Q6H  . magic mouthwash  10 mL Oral QID  . multivitamin with minerals  1 tablet Oral Daily  . mupirocin cream   Topical QID  . nystatin  5 mL Oral  QID  . pantoprazole  40 mg Oral Daily  . predniSONE  20 mg Oral Q breakfast  . sodium chloride  500 mL Intravenous Daily  . sodium chloride  500 mL Intravenous Q1200  . sulfamethoxazole-trimethoprim  2 tablet Oral Q8H   Continuous Infusions: . sodium chloride 100 mL/hr at 06/19/18 2355  . amphotericin  B  Liposome (AMBISOME) ADULT IV 300 mg (06/20/18 1033)     LOS: 12 days   Huey Bienenstock, MD [facility]  If 7PM-7AM, please contact night-coverage [email or web address] 06/20/2018, 2:12 PM

## 2018-06-20 NOTE — Progress Notes (Signed)
INFECTIOUS DISEASE PROGRESS NOTE  ID: Christopher Henson is a 37 y.o. male with  Active Problems:   Headache   Protein-calorie malnutrition, severe   AIDS ([facility])   Thrush   Cryptococcal meningitis ([facility])  Subjective: Eating soup.  No complaints.  occas headache, mostly at night.   Abtx:  Anti-infectives (From admission, onward)   Start     Dose/Rate Route Frequency Ordered Stop   06/13/18 1000  amphotericin B liposome (AMBISOME) 300 mg in dextrose 5 % 500 mL IVPB     300 mg 250 mL/hr over 120 Minutes Intravenous Daily 06/13/18 0802     06/11/18 1800  azithromycin (ZITHROMAX) tablet 500 mg  Status:  Discontinued     500 mg Oral Daily-1800 06/11/18 1126 06/11/18 1538   06/11/18 1700  sulfamethoxazole-trimethoprim (BACTRIM DS,SEPTRA DS) 800-160 MG per tablet 2 tablet     2 tablet Oral Every 8 hours 06/11/18 1538 07/02/18 1359   06/09/18 2000  azithromycin (ZITHROMAX) 500 mg in sodium chloride 0.9 % 250 mL IVPB  Status:  Discontinued     500 mg 250 mL/hr over 60 Minutes Intravenous Every 24 hours 06/09/18 1801 06/11/18 1126   06/09/18 1830  cefTRIAXone (ROCEPHIN) 1 g in sodium chloride 0.9 % 100 mL IVPB  Status:  Discontinued     1 g 200 mL/hr over 30 Minutes Intravenous Every 24 hours 06/09/18 1801 06/11/18 1538   06/08/18 1800  vancomycin (VANCOCIN) IVPB 1000 mg/200 mL premix  Status:  Discontinued     1,000 mg 200 mL/hr over 60 Minutes Intravenous Every 8 hours 06/08/18 0723 06/08/18 1021   06/08/18 1600  fluconazole (DIFLUCAN) IVPB 200 mg  Status:  Discontinued     200 mg 100 mL/hr over 60 Minutes Intravenous Daily 06/08/18 0949 06/08/18 1021   06/08/18 1200  amphotericin B liposome (AMBISOME) 290 mg in dextrose 5 % 500 mL IVPB  Status:  Discontinued     4 mg/kg  72.6 kg 250 mL/hr over 120 Minutes Intravenous Daily 06/08/18 0858 06/13/18 0802   06/08/18 1200  flucytosine (ANCOBON) capsule 1,750 mg     25 mg/kg  72.6 kg Oral Every 6 hours 06/08/18 0858     06/08/18 0800   vancomycin (VANCOCIN) 1,500 mg in sodium chloride 0.9 % 500 mL IVPB  Status:  Discontinued     1,500 mg 250 mL/hr over 120 Minutes Intravenous  Once 06/08/18 0655 06/08/18 1021   06/08/18 0700  cefTRIAXone (ROCEPHIN) 2 g in sodium chloride 0.9 % 100 mL IVPB  Status:  Discontinued     2 g 200 mL/hr over 30 Minutes Intravenous Every 12 hours 06/08/18 0651 06/08/18 1021      Medications:  Scheduled: . dextrose  10 mL Intravenous Q24H  . dextrose  10 mL Intravenous Q24H  . enoxaparin (LOVENOX) injection  30 mg Subcutaneous Q24H  . feeding supplement (ENSURE ENLIVE)  237 mL Oral BID BM  . flucytosine  25 mg/kg Oral Q6H  . magic mouthwash  10 mL Oral QID  . multivitamin with minerals  1 tablet Oral Daily  . mupirocin cream   Topical QID  . nystatin  5 mL Oral QID  . pantoprazole  40 mg Oral Daily  . predniSONE  20 mg Oral Q breakfast  . sodium chloride  500 mL Intravenous Daily  . sodium chloride  500 mL Intravenous Q1200  . sulfamethoxazole-trimethoprim  2 tablet Oral Q8H    Objective: Vital signs in last 24 hours: Temp:  [98.7  F (37.1 C)-99.6 F (37.6 C)] 98.7 F (37.1 C) (03/12 1523) Pulse Rate:  [89-93] 89 (03/12 1523) Resp:  [17-18] 17 (03/12 1523) BP: (105-121)/(71-77) 121/77 (03/12 1523) SpO2:  [99 %-100 %] 99 % (03/12 1523) Weight:  [61.6 kg] 61.6 kg (03/12 1525)   General appearance: alert, cooperative and no distress Resp: clear to auscultation bilaterally Cardio: regular rate and rhythm GI: normal findings: bowel sounds normal and soft, non-tender  Lab Results Recent Labs    06/18/18 0543 06/19/18 0612 06/20/18 0553  WBC 3.8* 3.3*  --   HGB 10.5* 10.3*  --   HCT 32.0* 31.2*  --   NA 136 136 138  K 3.8 3.9 4.0  CL 99 103 106  CO2 26 26 25   BUN 15 13 17   CREATININE 0.95 0.85 1.03   Liver Panel Recent Labs    06/19/18 0612 06/20/18 0553  PROT 6.2* 6.6  ALBUMIN 3.3* 3.5  AST 33 34  ALT 47* 54*  ALKPHOS 73 78  BILITOT 0.7 0.7   Sedimentation  Rate No results for input(s): ESRSEDRATE in the last 72 hours. C-Reactive Protein No results for input(s): CRP in the last 72 hours.  Microbiology: Recent Results (from the past 240 hour(s))  Gram stain     Status: None   Collection Time: 06/17/18  1:36 PM  Result Value Ref Range Status   Specimen Description CSF  Final   Special Requests NONE  Final   Gram Stain   Final    WBC PRESENT, PREDOMINANTLY MONONUCLEAR YEAST CYTOSPIN SMEAR CRITICAL RESULT CALLED TO, READ BACK BY AND VERIFIED WITH: M.LONG AT 1703 ON 06/17/18 BY N.THOMPSON Performed at [facility], [address], [place], [place] [address]    Report Status 06/17/2018 FINAL  Final    Studies/Results: No results found.   Assessment/Plan: AIDS Cryptococcal meningitis Worsening headaches Suspected PCP  Total days of antibiotics:9/21bactrim/prednisone 12/14ampho/flucytosine  Cr up slightly on ampho WBC continues to trend down, could be from flucytosine. Will continue to watch. PLT have also decreased though not to a critical level.   2 more days then can d/c with po fluconazole 400mg  daily.  21 total days of bactrim and prednisone, then 1 DS bactrim daily til CD4 > 200 He received azithro on adm. Would give him azithro 1.2g po weekly at d/c (hold for now due n/v)  Does not want AIDS, HIV or any aids defining illnesses on his out of work form.   F/u in ID clinic with me 07-03-18 at 9am         Johny Sax MD, FACP Infectious Diseases (pager) [phone] [email or web address] 06/20/2018, 4:03 PM  LOS: 12 days

## 2018-06-21 LAB — BASIC METABOLIC PANEL
Anion gap: 10 (ref 5–15)
BUN: 16 mg/dL (ref 6–20)
CO2: 25 mmol/L (ref 22–32)
Calcium: 8.8 mg/dL — ABNORMAL LOW (ref 8.9–10.3)
Chloride: 104 mmol/L (ref 98–111)
Creatinine, Ser: 1.06 mg/dL (ref 0.61–1.24)
GFR calc Af Amer: >60 mL/min (ref >60)
GFR calc non Af Amer: >60 mL/min (ref >60)
Glucose, Bld: 115 mg/dL — ABNORMAL HIGH (ref 70–99)
Potassium: 3.6 mmol/L (ref 3.5–5.1)
Sodium: 139 mmol/L (ref 135–145)

## 2018-06-21 LAB — CBC
HCT: 31.8 % — ABNORMAL LOW (ref 39.0–52.0)
Hemoglobin: 10.2 g/dL — ABNORMAL LOW (ref 13.0–17.0)
MCH: 27.8 pg (ref 26.0–34.0)
MCHC: 32.1 g/dL (ref 30.0–36.0)
MCV: 86.6 fL (ref 80.0–100.0)
Platelets: 97 10*3/uL — ABNORMAL LOW (ref 150–400)
RBC: 3.67 MIL/uL — ABNORMAL LOW (ref 4.22–5.81)
RDW: 17.1 % — ABNORMAL HIGH (ref 11.5–15.5)
WBC: 3.2 10*3/uL — ABNORMAL LOW (ref 4.0–10.5)
nRBC: 0 % (ref 0.0–0.2)

## 2018-06-21 LAB — MAGNESIUM: Magnesium: 2.1 mg/dL (ref 1.7–2.4)

## 2018-06-21 MED ORDER — PREDNISONE 20 MG PO TABS: 20.0000 mg | ORAL_TABLET | Freq: Every day | ORAL | 0 refills | Status: DC

## 2018-06-21 MED ORDER — ADULT MULTIVITAMIN W/MINERALS CH: 1.0000 | ORAL_TABLET | Freq: Every day | ORAL | 0 refills | Status: DC

## 2018-06-21 MED ORDER — ACETAMINOPHEN 325 MG PO TABS: 650.0000 mg | ORAL_TABLET | Freq: Four times a day (QID) | ORAL | Status: DC | PRN

## 2018-06-21 MED ORDER — MUPIROCIN CALCIUM 2 % EX CREA: TOPICAL_CREAM | Freq: Four times a day (QID) | CUTANEOUS | 0 refills | Status: DC

## 2018-06-21 MED ORDER — AZITHROMYCIN 600 MG PO TABS: 1200.0000 mg | ORAL_TABLET | ORAL | 0 refills | Status: DC

## 2018-06-21 MED ORDER — POTASSIUM CHLORIDE CRYS ER 20 MEQ PO TBCR
20.0000 meq | EXTENDED_RELEASE_TABLET | Freq: Every day | ORAL | Status: AC
  Administered 2018-06-21: 20 meq via ORAL
  Filled 2018-06-21: qty 1

## 2018-06-21 MED ORDER — FLUCONAZOLE 100 MG PO TABS: 400.0000 mg | ORAL_TABLET | Freq: Every day | ORAL | 0 refills | Status: DC

## 2018-06-21 MED ORDER — SULFAMETHOXAZOLE-TRIMETHOPRIM 800-160 MG PO TABS: ORAL_TABLET | ORAL | 0 refills | Status: DC

## 2018-06-21 MED ORDER — ENSURE ENLIVE PO LIQD: 237.0000 mL | Freq: Two times a day (BID) | ORAL | 12 refills | Status: DC

## 2018-06-21 MED ORDER — PANTOPRAZOLE SODIUM 40 MG PO TBEC: 40.0000 mg | DELAYED_RELEASE_TABLET | Freq: Every day | ORAL | 0 refills | Status: DC

## 2018-06-21 NOTE — Progress Notes (Signed)
Discharge instructions reviewed with pt. Prescriptions were reviewed. Pt discharged via wheelchair with no questions or concerns at this time.

## 2018-06-21 NOTE — Progress Notes (Signed)
Per pharmacy protocol for Ambisome, giving 20 mEq of KCl for K between 3.5-3.7.  Carolin Sicks, PharmD Candidate

## 2018-06-21 NOTE — Discharge Instructions (Signed)
Follow with Primary MD  in 7 days   Get CBC, CMP,  checked  by Primary MD next visit.    Activity: As tolerated with Full fall precautions use walker/cane & assistance as needed   Disposition Home    Diet: Regular diet   On your next visit with your primary care physician please Get Medicines reviewed and adjusted.   Please request your Prim.MD to go over all Hospital Tests and Procedure/Radiological results at the follow up, please get all Hospital records sent to your Prim MD by signing hospital release before you go home.   If you experience worsening of your admission symptoms, develop shortness of breath, life threatening emergency, suicidal or homicidal thoughts you must seek medical attention immediately by calling 911 or calling your MD immediately  if symptoms less severe.  You Must read complete instructions/literature along with all the possible adverse reactions/side effects for all the Medicines you take and that have been prescribed to you. Take any new Medicines after you have completely understood and accpet all the possible adverse reactions/side effects.   Do not drive, operating heavy machinery, perform activities at heights, swimming or participation in water activities or provide baby sitting services if your were admitted for syncope or siezures until you have seen by Primary MD or a Neurologist and advised to do so again.  Do not drive when taking Pain medications.    Do not take more than prescribed Pain, Sleep and Anxiety Medications  Special Instructions: If you have smoked or chewed Tobacco  in the last 2 yrs please stop smoking, stop any regular Alcohol  and or any Recreational drug use.  Wear Seat belts while driving.   Please note  You were cared for by a hospitalist during your hospital stay. If you have any questions about your discharge medications or the care you received while you were in the hospital after you are discharged, you can call the  unit and asked to speak with the hospitalist on call if the hospitalist that took care of you is not available. Once you are discharged, your primary care physician will handle any further medical issues. Please note that NO REFILLS for any discharge medications will be authorized once you are discharged, as it is imperative that you return to your primary care physician (or establish a relationship with a primary care physician if you do not have one) for your aftercare needs so that they can reassess your need for medications and monitor your lab values.  

## 2018-06-21 NOTE — Discharge Summary (Addendum)
Christopher Henson, is a 38 y.o. male  DOB 1980/07/09  MRN [id number].  Admission date:  06/07/2018  Admitting Physician  Eduard Clos, MD  Discharge Date:  06/21/2018   Primary MD  Patient, No Pcp Per  Recommendations for primary care physician for things to follow:  -Please check CBC, CMP during next visit -Patient to follow with ID clinic 07/03/2018 at 9 AM   Admission Diagnosis  Thrush [B37.0] Nausea and vomiting in adult [R11.2] Symptomatic HIV infection (HCC) [B20] Other headache syndrome [G44.89]   Discharge Diagnosis  Thrush [B37.0] Nausea and vomiting in adult [R11.2] Symptomatic HIV infection (HCC) [B20] Other headache syndrome [G44.89]    Active Problems:   Headache   Protein-calorie malnutrition, severe   AIDS (HCC)   Thrush   Cryptococcal meningitis (HCC)      Past Medical History:  Diagnosis Date   Alcohol abuse    GERD (gastroesophageal reflux disease)    Thrombocytopenia (HCC)     History reviewed. No pertinent surgical history.     History of present illness and  Hospital Course:     Kindly see H&P for history of present illness and admission details, please review complete Labs, Consult reports and Test reports for all details in brief  HPI  from the history and physical done on the day of admission 06/07/2018   HPI: Christopher Henson is a 38 y.o. male with Past medical history of alcohol use, GERD. Patient presents with complaints of headache.  This is ongoing since last 1 week. Associated with nausea as well as vomiting. Patient also report some dizziness without any vertigo. Patient was seen in the ER on June 05, 2018.  At which time his symptoms were concerning for HIV with the presence of oral thrush. Patient was given nystatin and was recommended to get an HIV checkup. As well as LP. Patient left the urgent care and now returns to the hospital with  again worsening headache. Reports that this is a 10 out of 10 headache, worst headache of his life throbbing in nature. Denies any nausea at the time of my evaluation.  No diarrhea. Denies having any prior diagnosis of HIV. He reports that while he was in the prison he had a tattoo as well as he reported to ID provider that he had previous male partner who are HIV positive.  He has not notified his current male partner about his diagnosis yet.  ED Course: Presents to the hospital with the headache, undergoes LP which shows elevated CSF pressure.  With concern for meningitis patient was referred for admission.  At his baseline ambulates without support And is independent for most of his ADL; manages his medication on his own.  Hospital Course   38 yo male with pmhx of GERD came to the hospital with complaints of headache and oral thrush.  Patient was diagnosed with HIV and works started on treatment with nystatin.  Lumbar puncture showed elevated opening pressure.  CSF Gram stain was positive for yeast and cryptococcal titers  were elevated.  Infectious disease were consulted who started patient on IV amphotericin B and flucytosine.  Eventually patient was also started on treatment for PCP pneumonia. Repeat LP performed 3/9, opening pressure improved.  HIV with AIDS, new Dx.  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  Gram stain for CSF was positive for yeast, cryptococcal titers elevated.  Repeat lumbar puncture 3/9-improvement in opening pressure of 23 -Fungal culture positive for cryptococcus neoformans - Treated with amphotericin, and flucytosine total of 14 days, will be transitioned on Diflucan 400 mg oral daily on discharge - Continue with Bactrim 2 tablets every 8 hours and prednisone, to finish total of 21 days, then Bactrim DS once  daily until his CD4 count is more than 200 to finish total prednisone of 21 days as well, he will be discharged on 20 mg oral daily for another 10 days. -After this he will have to be on ART. Caution with IRIS -Appreciate input from Dr. Ninetta Lights from infectious disease - UA is clear.  Blood cultures thus far is negative -Supportive care. -Hepatitis B and hepatitis C-negative, RPR-nonreactive -He was instructed to follow with Dr. Ninetta Lights in ID clinic 07/03/2018 at 9 AM -He is started on Magic mouthwash with nystatin for his oral thrush -recieved Hepatitis A/B vaccine series, Pneumovax prior to discharge, he refusedTdap.  Severe protein calorie malnutrition -Nutritionist input greatly appreciated, continue with supplements   Discharge Condition:  stable   Follow UP  Follow-up Information    Christopher Smart, MD Follow up.   Specialty:  Infectious Diseases Why:  on 07/03/2018 at 8 am. Contact information: [address] [place] [address] [phone]             Discharge Instructions  and  Discharge Medications     Discharge Instructions    Discharge instructions   Complete by:  As directed    Follow with Primary MD in 7 days   Get CBC, CMP,  checked  by Primary MD next visit.    Activity: As tolerated with Full fall precautions use walker/cane & assistance as needed   Disposition Home    Diet: Regular diet   On your next visit with your primary care physician please Get Medicines reviewed and adjusted.   Please request your Prim.MD to go over all Hospital Tests and Procedure/Radiological results at the follow up, please get all Hospital records sent to your Prim MD by signing hospital release before you go home.   If you experience worsening of your admission symptoms, develop shortness of breath, life threatening emergency, suicidal or homicidal thoughts you must seek medical attention immediately by calling 911 or calling your MD  immediately  if symptoms less severe.  You Must read complete instructions/literature along with all the possible adverse reactions/side effects for all the Medicines you take and that have been prescribed to you. Take any new Medicines after you have completely understood and accpet all the possible adverse reactions/side effects.   Do not drive, operating heavy machinery, perform activities at heights, swimming or participation in water activities or provide baby sitting services if your were admitted for syncope or siezures until you have seen by Primary MD or a Neurologist and advised to do so again.  Do not drive when taking Pain medications.    Do not take more than prescribed Pain, Sleep and Anxiety  Medications  Special Instructions: If you have smoked or chewed Tobacco  in the last 2 yrs please stop smoking, stop any regular Alcohol  and or any Recreational drug use.  Wear Seat belts while driving.   Please note  You were cared for by a hospitalist during your hospital stay. If you have any questions about your discharge medications or the care you received while you were in the hospital after you are discharged, you can call the unit and asked to speak with the hospitalist on call if the hospitalist that took care of you is not available. Once you are discharged, your primary care physician will handle any further medical issues. Please note that NO REFILLS for any discharge medications will be authorized once you are discharged, as it is imperative that you return to your primary care physician (or establish a relationship with a primary care physician if you do not have one) for your aftercare needs so that they can reassess your need for medications and monitor your lab values.   Increase activity slowly   Complete by:  As directed      Allergies as of 06/21/2018   No Known Allergies     Medication List    TAKE these medications   acetaminophen 325 MG tablet Commonly known  as:  TYLENOL Take 2 tablets (650 mg total) by mouth every 6 (six) hours as needed for mild pain (or Fever >/= 101). What changed:    medication strength  how much to take  when to take this  reasons to take this   azithromycin 600 MG tablet Commonly known as:  ZITHROMAX Take 2 tablets (1,200 mg total) by mouth once a week.   feeding supplement (ENSURE ENLIVE) Liqd Take 237 mLs by mouth 2 (two) times daily between meals.   fluconazole 100 MG tablet Commonly known as:  DIFLUCAN Take 4 tablets (400 mg total) by mouth daily.   metoCLOPramide 10 MG tablet Commonly known as:  Reglan Take 1 tablet (10 mg total) by mouth every 8 (eight) hours as needed for nausea (nausea/headache).   multivitamin with minerals Tabs tablet Take 1 tablet by mouth daily. Start taking on:  June 22, 2018   mupirocin cream 2 % Commonly known as:  BACTROBAN Apply topically 4 (four) times daily. To left ear   naproxen sodium 220 MG tablet Commonly known as:  ALEVE Take 220 mg by mouth.   nystatin 100000 UNIT/ML suspension Commonly known as:  MYCOSTATIN Take 5 mLs (500,000 Units total) by mouth 4 (four) times daily. Swish and spit this medication   pantoprazole 40 MG tablet Commonly known as:  PROTONIX Take 1 tablet (40 mg total) by mouth daily. Start taking on:  June 22, 2018   predniSONE 20 MG tablet Commonly known as:  DELTASONE Take 1 tablet (20 mg total) by mouth daily with breakfast. Please take for 10 days then stop Start taking on:  June 22, 2018   sulfamethoxazole-trimethoprim 800-160 MG tablet Commonly known as:  BACTRIM DS,SEPTRA DS Please take 2 tablets every 8 hours for next 10 days until 07/01/2018, then continue 1 tablet daily after that         Diet and Activity recommendation: See Discharge Instructions above   Consults obtained -  ID    Major procedures and Radiology Reports - PLEASE review detailed and final reports for all details, in brief -      Dg Abd  1 View  Result Date: 06/18/2018 CLINICAL DATA:  Abdominal pain and distention. EXAM: ABDOMEN - 1 VIEW COMPARISON:  None. FINDINGS: The bowel gas pattern is normal. No radio-opaque calculi or other significant radiographic abnormality are seen. IMPRESSION: No evidence of bowel obstruction or ileus. Electronically Signed   By: Lupita Raider, M.D.   On: 06/18/2018 15:52   Ct Head Wo Contrast  Result Date: 06/05/2018 CLINICAL DATA:  38 year old male with headache. EXAM: CT HEAD WITHOUT CONTRAST TECHNIQUE: Contiguous axial images were obtained from the base of the skull through the vertex without intravenous contrast. COMPARISON:  None. FINDINGS: Brain: The ventricles and sulci appropriate size for patient's age. The gray-white matter discrimination is preserved. There is no acute intracranial hemorrhage. No mass effect or midline shift. No extra-axial fluid collection. Vascular: No hyperdense vessel or unexpected calcification. Skull: Normal. Negative for fracture or focal lesion. Sinuses/Orbits: No acute finding. Other: None IMPRESSION: No acute intracranial pathology. Electronically Signed   By: Elgie Collard M.D.   On: 06/05/2018 06:09   Dg Chest Port 1 View  Result Date: 06/09/2018 CLINICAL DATA:  Shortness of breath and fever. EXAM: PORTABLE CHEST 1 VIEW COMPARISON:  None. FINDINGS: The cardiac silhouette is prominent but poorly evaluated due to the portable technique. The left hilum is normal. The right hilum is somewhat prominent compared to the left. The mediastinum is otherwise unremarkable. No pneumothorax. No nodules or masses. No overt edema. No focal infiltrate. IMPRESSION: 1. The right hilum is somewhat prominent compared to the left. Recommend a PA and lateral chest x-ray for better evaluation. If the finding persists, consider CT imaging. 2. No other significant abnormalities. Electronically Signed   By: Gerome Sam III M.D   On: 06/09/2018 19:47   Dg Fluoro Guide Lumbar  Puncture  Result Date: 06/17/2018 CLINICAL DATA:  New diagnosis of AIDS and cryptococcal meningitis with elevated opening pressure on lumbar puncture performed 06/07/2018. EXAM: DIAGNOSTIC LUMBAR PUNCTURE UNDER FLUOROSCOPIC GUIDANCE FLUOROSCOPY TIME:  Fluoroscopy Time: 6 seconds of low-dose pulsed fluoro Radiation Exposure Index (if provided by the fluoroscopic device): 1.1 mGy Number of Acquired Spot Images: 0 PROCEDURE: Time out procedure was performed. Informed consent was obtained from the patient prior to the procedure, including potential complications of headache, allergy, and pain. With the patient prone, the lower back was prepped with Betadine. 1% Lidocaine was used for local anesthesia. Lumbar puncture was performed at the L2-3 level using a 20 gauge needle with return of clear, colorless CSF with an opening pressure of 23 cm water (measured in the lateral decubitus position). 14 ml of CSF were obtained for laboratory studies. The patient tolerated the procedure well and there were no apparent complications. IMPRESSION: 1. Diagnostic lumbar puncture performed without immediate complication. 2. CSF opening pressure has slightly decreased compared with the prior study, now measuring 23 cm. Electronically Signed   By: Carey Bullocks M.D.   On: 06/17/2018 14:16    Micro Results     Recent Results (from the past 240 hour(s))  Gram stain     Status: None   Collection Time: 06/17/18  1:36 PM  Result Value Ref Range Status   Specimen Description CSF  Final   Special Requests NONE  Final   Gram Stain   Final    WBC PRESENT, PREDOMINANTLY MONONUCLEAR YEAST CYTOSPIN SMEAR CRITICAL RESULT CALLED TO, READ BACK BY AND VERIFIED WITH: M.LONG AT 1703 ON 06/17/18 BY N.THOMPSON Performed at [facility], [address], [place], [place] [address]    Report Status 06/17/2018 FINAL  Final       Today   Subjective:   [name] today has no headache,no chest or abdominal pain,  reports poor appetite, but good fluid intake, reports his generalized weakness has improved ambulated in the hallway couple times yesterday .  Objective:   Blood pressure 114/71, pulse 87, temperature 97.7 F (36.5 C), temperature source Oral, resp. rate 18, height 6' (1.829 m), weight 61.6 kg, SpO2 100 %.   Intake/Output Summary (Last 24 hours) at 06/21/2018 1322 Last data filed at 06/21/2018 1210 Gross per 24 hour  Intake 3440 ml  Output 2750 ml  Net 690 ml    Exam Awake Alert, Oriented x 3, No new F.N deficits, Normal affect, frail, thin appearing Symmetrical Chest wall movement, Good air movement bilaterally, CTAB RRR,No Gallops,Rubs or new Murmurs, No Parasternal Heave +ve B.Sounds, Abd Soft, Non tender,  No rebound -guarding or rigidity. No Cyanosis, Clubbing or edema, No new Rash or bruise  Data Review   CBC w Diff:  Lab Results  Component Value Date   WBC 3.2 (L) 06/21/2018   HGB 10.2 (L) 06/21/2018   HCT 31.8 (L) 06/21/2018   PLT 97 (L) 06/21/2018   LYMPHOPCT 6 06/05/2018   MONOPCT 5 06/05/2018   EOSPCT 0 06/05/2018   BASOPCT 1 06/05/2018    CMP:  Lab Results  Component Value Date   NA 139 06/21/2018   K 3.6 06/21/2018   CL 104 06/21/2018   CO2 25 06/21/2018   BUN 16 06/21/2018   CREATININE 1.06 06/21/2018   PROT 6.6 06/20/2018   ALBUMIN 3.5 06/20/2018   BILITOT 0.7 06/20/2018   ALKPHOS 78 06/20/2018   AST 34 06/20/2018   ALT 54 (H) 06/20/2018  .   Total Time in preparing paper work, data evaluation and todays exam - 35 minutes  Huey Bienenstock M.D on 06/21/2018 at 1:22 PM  [facility]   Office  [phone]

## 2018-06-28 ENCOUNTER — Encounter: Payer: Self-pay | Admitting: Infectious Diseases

## 2018-07-01 ENCOUNTER — Telehealth: Payer: Self-pay | Admitting: *Deleted

## 2018-07-01 ENCOUNTER — Encounter: Payer: Self-pay | Admitting: Infectious Diseases

## 2018-07-01 NOTE — Telephone Encounter (Signed)
RN called patient to confirm his appointment 3/25 per Dr Moshe Cipro request.  He needs a letter releasing him immediately to work without restriction. Current letter stated return to work date as 3/30.  Patient will discuss this with Dr Ninetta Lights at the appointment 3/25. RN confirmed office address, location with patient. Andree Coss, RN

## 2018-07-03 ENCOUNTER — Encounter: Payer: Self-pay | Admitting: Infectious Diseases

## 2018-07-03 ENCOUNTER — Ambulatory Visit: Payer: BLUE CROSS/BLUE SHIELD | Admitting: Infectious Diseases

## 2018-07-03 ENCOUNTER — Other Ambulatory Visit: Payer: Self-pay

## 2018-07-03 DIAGNOSIS — B451 Cerebral cryptococcosis: Secondary | ICD-10-CM

## 2018-07-03 DIAGNOSIS — B20 Human immunodeficiency virus [HIV] disease: Secondary | ICD-10-CM | POA: Diagnosis not present

## 2018-07-03 MED ORDER — SULFAMETHOXAZOLE-TRIMETHOPRIM 800-160 MG PO TABS: 1.0000 | ORAL_TABLET | Freq: Every day | ORAL | 3 refills | Status: DC

## 2018-07-03 MED ORDER — BICTEGRAVIR-EMTRICITAB-TENOFOV 50-200-25 MG PO TABS: 1.0000 | ORAL_TABLET | Freq: Every day | ORAL | 3 refills | Status: DC

## 2018-07-03 NOTE — Progress Notes (Signed)
   Subjective:    Patient ID: Christopher Henson, male    DOB: [birth year], 38 y.o.   MRN: [id number]  HPI 38 yo M with hx of HIV/AIDS dx 05-2018. He was also dx with crypto meningitis, thrush, and presumed to have PCP.  He has been hesitant to disclose to his partner, his case was sent to [facility].  Today states he has disclosed and she has been tested. She has stayed with him.   He feels better. No headaches, no neck pain (except positional).    CD4 T Cell Abs (/uL)  Date Value  06/11/2018 10 (L)   The past medical history, family history and social history were reviewed/updated in EPIC   Review of Systems  Constitutional: Negative for appetite change, chills, fever and unexpected weight change.  Gastrointestinal: Negative for constipation and diarrhea.  Genitourinary: Negative for difficulty urinating.  Musculoskeletal: Negative for neck stiffness.  Neurological: Negative for headaches.  wt up 10# Please see HPI. All other systems reviewed and negative.    Objective:   Physical Exam Constitutional:      Appearance: Normal appearance.  HENT:     Mouth/Throat:     Mouth: Mucous membranes are moist.     Pharynx: No oropharyngeal exudate.  Eyes:     Extraocular Movements: Extraocular movements intact.     Pupils: Pupils are equal, round, and reactive to light.  Neck:     Musculoskeletal: Normal range of motion and neck supple.  Cardiovascular:     Rate and Rhythm: Normal rate and regular rhythm.  Pulmonary:     Effort: Pulmonary effort is normal.     Breath sounds: Normal breath sounds.  Abdominal:     General: Bowel sounds are normal. There is no distension.     Palpations: Abdomen is soft.     Tenderness: There is no abdominal tenderness.  Musculoskeletal:     Right lower leg: No edema.     Left lower leg: No edema.  Neurological:     General: No focal deficit present.     Mental Status: He is alert and oriented to person, place, and time.  Psychiatric:        Mood and  Affect: Mood normal.       Assessment & Plan:

## 2018-07-03 NOTE — Progress Notes (Signed)
HPI: Christopher Henson is a 38 y.o. male who presents to the [facility] today to initiate care with Dr. Johnnye Sima for his newly diagnosed HIV infection.  Patient Active Problem List   Diagnosis Date Noted  . Protein-calorie malnutrition, severe 06/10/2018  . AIDS ([place])   . Thrush   . Cryptococcal meningitis ([place])   . Headache 06/08/2018    Patient's Medications  New Prescriptions   BICTEGRAVIR-EMTRICITABINE-TENOFOVIR AF (BIKTARVY) 50-200-25 MG TABS TABLET    Take 1 tablet by mouth daily.  Previous Medications   ACETAMINOPHEN (TYLENOL) 325 MG TABLET    Take 2 tablets (650 mg total) by mouth every 6 (six) hours as needed for mild pain (or Fever >/= 101).   AZITHROMYCIN (ZITHROMAX) 600 MG TABLET    Take 2 tablets (1,200 mg total) by mouth once a week.   FEEDING SUPPLEMENT, ENSURE ENLIVE, (ENSURE ENLIVE) LIQD    Take 237 mLs by mouth 2 (two) times daily between meals.   FLUCONAZOLE (DIFLUCAN) 100 MG TABLET    Take 4 tablets (400 mg total) by mouth daily.   METOCLOPRAMIDE (REGLAN) 10 MG TABLET    Take 1 tablet (10 mg total) by mouth every 8 (eight) hours as needed for nausea (nausea/headache).   MULTIPLE VITAMIN (MULTIVITAMIN WITH MINERALS) TABS TABLET    Take 1 tablet by mouth daily.   MUPIROCIN CREAM (BACTROBAN) 2 %    Apply topically 4 (four) times daily. To left ear   NYSTATIN (MYCOSTATIN) 100000 UNIT/ML SUSPENSION    Take 5 mLs (500,000 Units total) by mouth 4 (four) times daily. Swish and spit this medication   PANTOPRAZOLE (PROTONIX) 40 MG TABLET    Take 1 tablet (40 mg total) by mouth daily.  Modified Medications   Modified Medication Previous Medication   SULFAMETHOXAZOLE-TRIMETHOPRIM (BACTRIM DS,SEPTRA DS) 800-160 MG TABLET sulfamethoxazole-trimethoprim (BACTRIM DS,SEPTRA DS) 800-160 MG tablet      Take 1 tablet by mouth daily. Please take 2 tablets every 8 hours for next 10 days until 07/01/2018, then continue 1 tablet daily after that    Please take 2 tablets every 8 hours for next 10  days until 07/01/2018, then continue 1 tablet daily after that  Discontinued Medications   NAPROXEN SODIUM (ALEVE) 220 MG TABLET    Take 220 mg by mouth.   PREDNISONE (DELTASONE) 20 MG TABLET    Take 1 tablet (20 mg total) by mouth daily with breakfast. Please take for 10 days then stop    Allergies: No Known Allergies  Past Medical History: Past Medical History:  Diagnosis Date  . AIDS (acquired immune deficiency syndrome) ([place])   . Alcohol abuse   . 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 GERD (gastroesophageal reflux disease)   . Thrombocytopenia ([place])     Social History: Social History   Socioeconomic History  . Marital status: Single    Spouse name: Not on file  . Number of children: Not on file  . Years of education: Not on file  . Highest education level: Not on file  Occupational History  . Not on file  Social Needs  . Financial resource strain: Not on file  . Food insecurity:    Worry: Not on file    Inability: Not on file  . Transportation needs:    Medical: Not on file    Non-medical: Not on file  Tobacco Use  . Smoking status: Current Every Day Smoker    Packs/day: 1.00    Types: Cigarettes  . Smokeless tobacco:  Never Used  Substance and Sexual Activity  . Alcohol use: Yes  . Drug use: Not Currently    Types: Marijuana  . Sexual activity: Not on file  Lifestyle  . Physical activity:    Days per week: Not on file    Minutes per session: Not on file  . Stress: Not on file  Relationships  . Social connections:    Talks on phone: Not on file    Gets together: Not on file    Attends religious service: Not on file    Active member of club or organization: Not on file    Attends meetings of clubs or organizations: Not on file    Relationship status: Not on file  Other Topics Concern  . Not on file  Social History Narrative  . Not on file    Labs: Lab Results  Component Value Date   CD4TABS 10 (L) 06/11/2018    RPR and STI Lab Results    Component Value Date   LABRPR Non Reactive 06/10/2018    STI Results GC CT  06/10/2018 Negative Negative    Hepatitis B Lab Results  Component Value Date   HEPBSAG Negative 06/09/2018   Hepatitis C No results found for: HEPCAB, HCVRNAPCRQN Hepatitis A No results found for: HAV Lipids: Lab Results  Component Value Date   CHOL 131 06/10/2018   TRIG 195 (H) 06/10/2018   HDL 19 (L) 06/10/2018   CHOLHDL 6.9 06/10/2018   VLDL 39 06/10/2018   LDLCALC 73 06/10/2018    Current HIV Regimen: Treatment naive  Assessment: Christopher Henson is here today to follow-up with Dr. Johnnye Sima after being diagnosed with HIV, cryptococcal meningitis, thrush, and possibly PCP in the hospital. We will start him on Biktarvy.  Explained that Christopher Henson is a one pill once daily medication with or without food and the importance of not missing any doses. Explained resistance and how it develops and why it is so important to take Biktarvy daily and not skip days or doses. Counseled patient to take it around the same time each day. Counseled on what to do if dose is missed, if closer to missed dose take immediately, if closer to next dose then skip and resume normal schedule.   Cautioned on possible side effects the first week or so including nausea, diarrhea, dizziness, and headaches but that they should resolve after the first couple of weeks. I reviewed patient medications and found no drug interactions. Counseled patient to separate Biktarvy from divalent cations including multivitamins. Discussed with patient to call clinic if he starts a new medication or herbal supplement. I gave the patient my card and told him to call me with any issues/questions/concerns.  He is insured and [facility]. I gave him a copay card and told him to call me if he has any issues.  He will follow-up with me in 4-5 weeks.  Plan: - Start Biktarvy PO once daily - F/u with me 4/30 at 915am - F/u with Dr. Johnnye Sima 6/26 at  [place]. Christopher Henson, PharmD, BCIDP, AAHIVP, [place] [facility] 07/03/2018, 10:15 AM

## 2018-07-03 NOTE — Assessment & Plan Note (Addendum)
Will start him on ART.  Flu vax= pt defers.  Change bactrim to qday Continue azithro for now.  Dental referral placed today for [facility]. Information to schedule appointment completed today.   Will have him meet with pharm Has condoms Given back to work note.  rtc with pharm in 4 weeks, me in 8-12

## 2018-07-03 NOTE — Assessment & Plan Note (Signed)
Appears improved.  Complete 12 weeks of flucon (June 3)

## 2018-07-09 ENCOUNTER — Telehealth: Payer: Self-pay | Admitting: Pharmacy Technician

## 2018-07-09 LAB — HIV-1 RNA, PCR (GRAPH) RFX/GENO EDI
HIV-1 RNA BY PCR: 1230000 copies/mL
HIV-1 RNA BY PCR: 890000 copies/mL
HIV-1 RNA Quant, Log: 6.09 log10copy/mL
HIV-1 RNA Quant, Log: 5.949 log10copy/mL

## 2018-07-09 LAB — HIV GENOSURE(R) MG

## 2018-07-09 LAB — REFLEX TO GENOSURE(R) MG EDI
HIV GenoSure(R): 1
HIV GenoSure(R): 1

## 2018-07-09 NOTE — Telephone Encounter (Signed)
[facility] Patient Advocate Encounter  Prior Authorization for Susanne Borders has been approved.     Effective dates: 07/09/2018 through 07/09/2019  Patients co-pay is $200.  He has a copay card to cover the copay.   Patient is aware.  Netty Starring. Dimas Aguas CPhT [facility] Patient [facility] for Infect[facility] Disease Phone: [phone] Fax:  [phone]

## 2018-07-12 ENCOUNTER — Encounter: Payer: Self-pay | Admitting: Infectious Diseases

## 2018-07-19 LAB — FUNGUS CULTURE RESULT

## 2018-07-19 LAB — FUNGUS CULTURE WITH STAIN

## 2018-07-19 LAB — FUNGAL ORGANISM REFLEX

## 2018-08-05 ENCOUNTER — Encounter: Payer: Self-pay | Admitting: *Deleted

## 2018-08-05 ENCOUNTER — Other Ambulatory Visit: Payer: Self-pay

## 2018-08-05 ENCOUNTER — Encounter: Payer: Self-pay | Admitting: Pharmacist

## 2018-08-05 ENCOUNTER — Ambulatory Visit (INDEPENDENT_AMBULATORY_CARE_PROVIDER_SITE_OTHER): Payer: BLUE CROSS/BLUE SHIELD | Admitting: Pharmacist

## 2018-08-05 DIAGNOSIS — R112 Nausea with vomiting, unspecified: Secondary | ICD-10-CM

## 2018-08-05 DIAGNOSIS — B20 Human immunodeficiency virus [HIV] disease: Secondary | ICD-10-CM | POA: Diagnosis not present

## 2018-08-05 DIAGNOSIS — B451 Cerebral cryptococcosis: Secondary | ICD-10-CM | POA: Diagnosis not present

## 2018-08-05 MED ORDER — SULFAMETHOXAZOLE-TRIMETHOPRIM 800-160 MG PO TABS: 1.0000 | ORAL_TABLET | Freq: Every day | ORAL | 5 refills | Status: DC

## 2018-08-05 MED ORDER — FLUCONAZOLE 200 MG PO TABS: 400.0000 mg | ORAL_TABLET | Freq: Every day | ORAL | 3 refills | Status: DC

## 2018-08-05 MED ORDER — ONDANSETRON 4 MG PO TBDP: 4.0000 mg | ORAL_TABLET | Freq: Three times a day (TID) | ORAL | 0 refills | Status: DC | PRN

## 2018-08-05 MED ORDER — AZITHROMYCIN 600 MG PO TABS: 1200.0000 mg | ORAL_TABLET | ORAL | 3 refills | Status: DC

## 2018-08-05 NOTE — Progress Notes (Signed)
HPI: Christopher Henson is a 38 y.o. male who presents to the [facility] clinic for HIV follow-up.  He was recently diagnosed with HIV, cryptococcal meningitis, thrush, and possible PCP pneumonia.  Patient Active Problem List   Diagnosis Date Noted   Protein-calorie malnutrition, severe 06/10/2018   AIDS ([place])    Thrush    Cryptococcal meningitis ([place])    Headache 06/08/2018    Patient's Medications  New Prescriptions   FLUCONAZOLE (DIFLUCAN) 200 MG TABLET    Take 2 tablets (400 mg total) by mouth daily.   ONDANSETRON (ZOFRAN ODT) 4 MG DISINTEGRATING TABLET    Take 1 tablet (4 mg total) by mouth every 8 (eight) hours as needed for nausea or vomiting.  Previous Medications   ACETAMINOPHEN (TYLENOL) 325 MG TABLET    Take 2 tablets (650 mg total) by mouth every 6 (six) hours as needed for mild pain (or Fever >/= 101).   BICTEGRAVIR-EMTRICITABINE-TENOFOVIR AF (BIKTARVY) 50-200-25 MG TABS TABLET    Take 1 tablet by mouth daily.   FEEDING SUPPLEMENT, ENSURE ENLIVE, (ENSURE ENLIVE) LIQD    Take 237 mLs by mouth 2 (two) times daily between meals.   METOCLOPRAMIDE (REGLAN) 10 MG TABLET    Take 1 tablet (10 mg total) by mouth every 8 (eight) hours as needed for nausea (nausea/headache).   MULTIPLE VITAMIN (MULTIVITAMIN WITH MINERALS) TABS TABLET    Take 1 tablet by mouth daily.   MUPIROCIN CREAM (BACTROBAN) 2 %    Apply topically 4 (four) times daily. To left ear   NYSTATIN (MYCOSTATIN) 100000 UNIT/ML SUSPENSION    Take 5 mLs (500,000 Units total) by mouth 4 (four) times daily. Swish and spit this medication   PANTOPRAZOLE (PROTONIX) 40 MG TABLET    Take 1 tablet (40 mg total) by mouth daily.  Modified Medications   Modified Medication Previous Medication   AZITHROMYCIN (ZITHROMAX) 600 MG TABLET azithromycin (ZITHROMAX) 600 MG tablet      Take 2 tablets (1,200 mg total) by mouth once a week.    Take 2 tablets (1,200 mg total) by mouth once a week.   SULFAMETHOXAZOLE-TRIMETHOPRIM (BACTRIM DS)  800-160 MG TABLET sulfamethoxazole-trimethoprim (BACTRIM DS,SEPTRA DS) 800-160 MG tablet      Take 1 tablet by mouth daily.    Take 1 tablet by mouth daily. Please take 2 tablets every 8 hours for next 10 days until 07/01/2018, then continue 1 tablet daily after that  Discontinued Medications   FLUCONAZOLE (DIFLUCAN) 100 MG TABLET    Take 4 tablets (400 mg total) by mouth daily.    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   GERD (gastroesophageal reflux disease)    Thrombocytopenia ([facility])     Social History: Social History   Socioeconomic History   Marital status: Single    Spouse name: Not on file   Number of children: Not on file   Years of education: Not on file   Highest education level: Not on file  Occupational History   Not on file  Social Needs   Financial resource strain: Not on file   Food insecurity:    Worry: Not on file    Inability: Not on file   Transportation needs:    Medical: Not on file    Non-medical: Not on file  Tobacco Use   Smoking status: Current Every  Day Smoker    Packs/day: 1.00    Types: Cigarettes   Smokeless tobacco: Never Used  Substance and Sexual Activity   Alcohol use: Yes   Drug use: Not Currently    Types: Marijuana   Sexual activity: Not on file  Lifestyle   Physical activity:    Days per week: Not on file    Minutes per session: Not on file   Stress: Not on file  Relationships   Social connections:    Talks on phone: Not on file    Gets together: Not on file    Attends religious service: Not on file    Active member of club or organization: Not on file    Attends meetings of clubs or organizations: Not on file    Relationship status: Not on file  Other Topics Concern   Not on file  Social History Narrative   Not on file    Labs: Lab  Results  Component Value Date   CD4TABS 14 (L) 08/05/2018   CD4TABS 10 (L) 06/11/2018    RPR and STI Lab Results  Component Value Date   LABRPR Non Reactive 06/10/2018    STI Results GC CT  06/10/2018 Negative Negative    Hepatitis B Lab Results  Component Value Date   HEPBSAG Negative 06/09/2018   Hepatitis C No results found for: HEPCAB, HCVRNAPCRQN Hepatitis A No results found for: HAV Lipids: Lab Results  Component Value Date   CHOL 131 06/10/2018   TRIG 195 (H) 06/10/2018   HDL 19 (L) 06/10/2018   CHOLHDL 6.9 06/10/2018   VLDL 39 06/10/2018   Arp 73 06/10/2018    Current HIV Regimen: Biktarvy  Assessment: Christopher Henson is here today to follow-up for his newly diagnosed HIV infection.  He was recently started on Biktarvy back in 3/25 when he saw Dr. Johnnye Sima. His Biktarvy needed a prior authorization so it was a few days before he could start taking it, but he did finally start around 07/09/2018.  He is also supposed to be taking fluconazole 400 mg daily for the consolidation phase of his treatment for cryptococcal meningitis and for treatment of his thrush, azithromycin weekly for MAC prevention, and bactrim for PCP pneumonia secondary prophylaxis (he finished 21 days of treatment).   He comes in today and states that he ran out of his fluconazole and azithromycin because there were no refills on either one of those and he was only discharged from the hospital with a prescription for a few weeks. I spent a lot of time discussing how important these medications were, especially the fluconazole.  He did not really know why he was taking it and I explained the meningitis infection that he was diagnosed with. I also told him that he couldn't have a lapse in that treatment and needs to pick up his refill ASAP.  He has been out for about a week or so he states.  Will send in refills today for him.  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 He will also continue taking weekly azithromycin for now.  He also states that he stopped taking his Bactrim for a few days due to nausea but has started back on it.  I spent some more time going over all of his medications with him and I think he understands the need to take all of them and not stop until we tell him to.  He is complaining of headaches  untouched with Tylenol, dizziness, and 4-5 episodes of vomiting in the last month.  He is interested in trying Zofran, so I will send in a prescription for him today. Otherwise, he is tolerating everything ok and taking his Biktarvy everyday. Will send in all of his refills and check his HIV labs today. Will discuss this with Dr. Johnnye Sima and see if anything more needs to be done in terms of workup/cryptococcal follow-up.  He sees Dr. Johnnye Sima in June and knows to call with any issues before then if he does not hear from [place] first.    Plan: - Continue Biktarvy PO once daily - Continue fluconazole 400 mg PO daily - Continue Bactrim 1 DS tablet PO daily - Continue azithromycin 1200 mg PO weekly - Start Zofran 4 mg ODT q8hrs PRN n/v, take 30 mins before medications - HIV viral load, CD4, CMET, CBC today - F/u with Dr. Johnnye Sima 6/26 at [place]. Jeannine Pennisi, PharmD, BCIDP, AAHIVP, [place] 08/06/2018, 2:20 PM

## 2018-08-06 ENCOUNTER — Telehealth: Payer: Self-pay | Admitting: Pharmacist

## 2018-08-06 LAB — T-HELPER CELL (CD4) - (RCID CLINIC ONLY)
CD4 % Helper T Cell: 4 % — ABNORMAL LOW (ref 33–55)
CD4 T Cell Abs: 14 /uL — ABNORMAL LOW (ref 400–2700)

## 2018-08-06 NOTE — Telephone Encounter (Signed)
Called patient to see if he [facility]ills [facility] and to schedule him for a midway appointment with [facility] for follow-up. His cell phone states that he is not accepting calls at this time. Called his house phone and a lady told me to call him between 9-10am because he works night shift.  Will call patient in the morning.

## 2018-08-07 NOTE — Telephone Encounter (Signed)
I was able to reach patient today.  He did pick up all of his refills and has restarted taking fluconazole and azithromycin. His headaches are not as bad today but still come and go.  He was agreeable to seeing Judeth Cornfield in 1 month. Made him an appointment for 5/28 at 930am.

## 2018-08-07 NOTE — Telephone Encounter (Signed)
Thanks Cassie for following up with him to ensure he has what he needs. Happy to do a check in and make sure he is improving.

## 2018-08-07 NOTE — Telephone Encounter (Signed)
Thanks everyone, I really appreciate it!

## 2018-08-08 ENCOUNTER — Ambulatory Visit: Payer: BLUE CROSS/BLUE SHIELD | Admitting: Pharmacist

## 2018-08-08 LAB — CBC
HCT: 37.9 % — ABNORMAL LOW (ref 38.5–50.0)
Hemoglobin: 12.8 g/dL — ABNORMAL LOW (ref 13.2–17.1)
MCH: 29.9 pg (ref 27.0–33.0)
MCHC: 33.8 g/dL (ref 32.0–36.0)
MCV: 88.6 fL (ref 80.0–100.0)
MPV: 11.1 fL (ref 7.5–12.5)
Platelets: 88 10*3/uL — ABNORMAL LOW (ref 140–400)
RBC: 4.28 10*6/uL (ref 4.20–5.80)
RDW: 16.4 % — ABNORMAL HIGH (ref 11.0–15.0)
WBC: 1.5 10*3/uL — ABNORMAL LOW (ref 3.8–10.8)

## 2018-08-08 LAB — COMPREHENSIVE METABOLIC PANEL
AG Ratio: 1.6 (calc) (ref 1.0–2.5)
ALT: 28 U/L (ref 9–46)
AST: 17 U/L (ref 10–40)
Albumin: 3.9 g/dL (ref 3.6–5.1)
Alkaline phosphatase (APISO): 78 U/L (ref 36–130)
BUN: 13 mg/dL (ref 7–25)
CO2: 29 mmol/L (ref 20–32)
Calcium: 8.8 mg/dL (ref 8.6–10.3)
Chloride: 107 mmol/L (ref 98–110)
Creat: 0.9 mg/dL (ref 0.60–1.35)
Globulin: 2.5 g/dL (calc) (ref 1.9–3.7)
Glucose, Bld: 114 mg/dL — ABNORMAL HIGH (ref 65–99)
Potassium: 3.7 mmol/L (ref 3.5–5.3)
Sodium: 143 mmol/L (ref 135–146)
Total Bilirubin: 0.5 mg/dL (ref 0.2–1.2)
Total Protein: 6.4 g/dL (ref 6.1–8.1)

## 2018-08-08 LAB — HIV-1 RNA QUANT-NO REFLEX-BLD
HIV 1 RNA Quant: 653000 copies/mL — ABNORMAL HIGH
HIV-1 RNA Quant, Log: 5.81 Log copies/mL — ABNORMAL HIGH

## 2018-08-16 ENCOUNTER — Ambulatory Visit: Payer: BLUE CROSS/BLUE SHIELD | Admitting: Infectious Diseases

## 2018-09-04 NOTE — Progress Notes (Signed)
Name: Theophil Thivierge  DOB: Sep 17, 1980 MRN: [id number] PCP: Patient, No Pcp Per    Brief Narrative:  Toris is a 38 y.o. 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 CD4 nadir 10, VL 1.23 million HIV Risk: MSM History of OIs: Cryptococcal meningitis, oral candidiasis Intake Labs June 09, 2018: Hep B sAg (-), sAb (-), cAb (not done); Hep A (not done), Hep C (-) Quantiferon (not done) HLA B*5701 (-) G6PD: ()   Previous Regimens: . Biktarvy  Genotypes: . Not done while inpatient.  Patient Active Problem List   Diagnosis Date Noted  . HIV (human immunodeficiency virus infection) ([place]) 09/05/2018  . Long term (current) use of antibiotics 09/05/2018  . Protein-calorie malnutrition, severe 06/10/2018  . AIDS ([place])   . Cryptococcal meningitis ([facility])     Subjective:  CC: Follow-up for cryptococcal meningitis, AIDS.  Still having some neck pain.  HPI: Jahzier last saw our pharmacy team for entry to care and hospital follow-up about a month ago now.  He was supposed to have been on fluconazole therapy for consolidation phase of treatment for his cryptococcal meningitis as well as azithromycin weekly for MAC prevention and once daily Bactrim for PCP secondary prophylaxis in addition to his [place] however was not taking any of his prophylaxis.  At the time of the visit he told Cassie he was out of these medicines for about a week.  He was also experiencing some worsening of headaches at the time of the visit.  Cassie very kindly and promptly got him refills and discussed all of these medications and why he still needs them.  Repeat CD4 count after starting his ART remains low at 14 cells.  He was also having some nausea at the time of the visit and was prescribed Zofran ODT for management.  He feels that the nausea has resolved with taking his medications with food and separating his  pills to does not take them all at once.  He has missed about a week's worth of his Biktarvy scattered over the last 4 weeks unfortunately.  He is taking pills straight from his bottle and does not have a timer or a pill tray or keychain.  He lives at home with his 2 children and his wife.  He feels well supported from them.  He does not know the names of his medications but can tell me he is taking a small pink pill, larger white pill and his red Biktarvy.  He would like to discuss further about what happened during his hospital stay as he does not quite have all the details put together.  Cannot tell me how we monitor his HIV infection.  Unfamiliar with CD4 count or viral load concept.  He denies any trouble with depression or anxiety.  Denies any fevers, chills, severe headaches or eye pain, vision changes, hearing loss or trouble with walking or performing daily tasks.  His shortness of breath has improved significantly and he no longer has a cough.  Depression screen [facility] 2/9 09/05/2018  Decreased Interest 3  Down, Depressed, Hopeless 3  PHQ - 2 Score 6  Altered sleeping 3  Tired, decreased energy 3  Change in appetite 3  Feeling bad or failure about yourself  3  Trouble concentrating 3  Moving slowly or fidgety/restless 3  Suicidal thoughts 0  PHQ-9 Score 24    Review of Systems  Constitutional: Negative for chills, fever,  malaise/fatigue and weight loss.  HENT: Negative for sore throat.        No dental problems  Respiratory: Negative for cough and sputum production.   Cardiovascular: Negative for chest pain and leg swelling.  Gastrointestinal: Negative for abdominal pain, diarrhea and vomiting.  Genitourinary: Negative for dysuria and flank pain.  Musculoskeletal: Positive for neck pain. Negative for joint pain and myalgias.  Skin: Negative for rash.  Neurological: Negative for dizziness, tingling and headaches.  Psychiatric/Behavioral: Negative for depression and substance abuse.  The patient is not nervous/anxious and does not have insomnia.     Past Medical History:  Diagnosis Date  . AIDS (acquired immune deficiency syndrome) ([place])   . Alcohol abuse   . 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 GERD (gastroesophageal reflux disease)   . Thrombocytopenia ([place])     Outpatient Medications Prior to Visit  Medication Sig Dispense Refill  . acetaminophen (TYLENOL) 325 MG tablet Take 2 tablets (650 mg total) by mouth every 6 (six) hours as needed for mild pain (or Fever >/= 101).    . feeding supplement, ENSURE ENLIVE, (ENSURE ENLIVE) LIQD Take 237 mLs by mouth 2 (two) times daily between meals. 237 mL 12  . fluconazole (DIFLUCAN) 200 MG tablet Take 2 tablets (400 mg total) by mouth daily. 60 tablet 3  . metoCLOPramide (REGLAN) 10 MG tablet Take 1 tablet (10 mg total) by mouth every 8 (eight) hours as needed for nausea (nausea/headache). 10 tablet 0  . Multiple Vitamin (MULTIVITAMIN WITH MINERALS) TABS tablet Take 1 tablet by mouth daily. 30 tablet 0  . mupirocin cream (BACTROBAN) 2 % Apply topically 4 (four) times daily. To left ear 15 g 0  . nystatin (MYCOSTATIN) 100000 UNIT/ML suspension Take 5 mLs (500,000 Units total) by mouth 4 (four) times daily. Swish and spit this medication 60 mL 0  . ondansetron (ZOFRAN ODT) 4 MG disintegrating tablet Take 1 tablet (4 mg total) by mouth every 8 (eight) hours as needed for nausea or vomiting. 20 tablet 0  . pantoprazole (PROTONIX) 40 MG tablet Take 1 tablet (40 mg total) by mouth daily. 30 tablet 0  . sulfamethoxazole-trimethoprim (BACTRIM DS) 800-160 MG tablet Take 1 tablet by mouth daily. 30 tablet 5  . azithromycin (ZITHROMAX) 600 MG tablet Take 2 tablets (1,200 mg total) by mouth once a week. (Patient not taking: Reported on 09/05/2018) 8 tablet 3  . bictegravir-emtricitabine-tenofovir AF (BIKTARVY) 50-200-25 MG TABS tablet Take 1 tablet by mouth daily. (Patient not taking: Reported on 09/05/2018) 90 tablet 3   No  facility-administered medications prior to visit.      No Known Allergies  Social History   Tobacco Use  . Smoking status: Current Every Day Smoker    Packs/day: 1.00    Types: Cigarettes  . Smokeless tobacco: Never Used  Substance Use Topics  . Alcohol use: Yes  . Drug use: Not Currently    Types: Marijuana    Family History  Problem Relation Age of Onset  . Diabetes Mother   . Hypertension Mother   . Diabetes Father   . Hypertension Father   . Healthy Sister   . Healthy Brother   . Healthy Sister   . Healthy Sister   . Healthy Sister   . Healthy Brother   . Healthy Brother     Social History   Substance and Sexual Activity  Sexual Activity Not on file     Objective:   Vitals:   09/05/18 0933  BP: 120/85  Pulse: 85  Temp: 98.3 F (36.8 C)  Weight: 164 lb (74.4 kg)   Body mass index is 22.24 kg/m.  Physical Exam Constitutional:      Comments: Seated comfortably in his chair.  Looking down the floor avoiding eye contact for majority of the visit.  Thin chronically ill-appearing.  HENT:     Head:     Comments: Hair loss noted underneath his baseball cap.    Mouth/Throat:     Mouth: Mucous membranes are moist.     Pharynx: Oropharynx is clear.  Eyes:     General: No scleral icterus.    Pupils: Pupils are equal, round, and reactive to light.  Neck:     Musculoskeletal: Normal range of motion. No neck rigidity or muscular tenderness.  Cardiovascular:     Rate and Rhythm: Normal rate and regular rhythm.     Pulses: Normal pulses.     Heart sounds: Normal heart sounds. No murmur.  Pulmonary:     Effort: Pulmonary effort is normal.     Breath sounds: Normal breath sounds. No wheezing or rales.  Abdominal:     General: There is no distension.     Tenderness: There is no abdominal tenderness.  Lymphadenopathy:     Cervical: No cervical adenopathy.  Skin:    General: Skin is warm and dry.     Capillary Refill: Capillary refill takes less than 2  seconds.  Neurological:     Mental Status: He is oriented to person, place, and time.  Psychiatric:     Comments: Flat affect.  Appears to have a depressed mood.     Lab Results Lab Results  Component Value Date   WBC 1.5 (L) 08/05/2018   HGB 12.8 (L) 08/05/2018   HCT 37.9 (L) 08/05/2018   MCV 88.6 08/05/2018   PLT 88 (L) 08/05/2018    Lab Results  Component Value Date   CREATININE 0.90 08/05/2018   BUN 13 08/05/2018   NA 143 08/05/2018   K 3.7 08/05/2018   CL 107 08/05/2018   CO2 29 08/05/2018    Lab Results  Component Value Date   ALT 28 08/05/2018   AST 17 08/05/2018   ALKPHOS 78 06/20/2018   BILITOT 0.5 08/05/2018    Lab Results  Component Value Date   CHOL 131 06/10/2018   HDL 19 (L) 06/10/2018   LDLCALC 73 06/10/2018   TRIG 195 (H) 06/10/2018   CHOLHDL 6.9 06/10/2018   HIV 1 RNA Quant (copies/mL)  Date Value  08/05/2018 653,000 (H)   CD4 T Cell Abs (/uL)  Date Value  08/05/2018 14 (L)  06/11/2018 10 (L)     Assessment & Plan:   Problem List Items Addressed This Visit      Unprioritized   AIDS ([place]) - Primary    CD4 count 1 month ago was at 44.  We will repeat his viral load and CD4 today.  He is still having some ongoing neck pain but it is intermittent.  Nothing like the headaches he was having before. Oral candidiasis is resolved. Continue weekly azithromycin, daily fluconazole for consolidation with cryptococcal meningitis and daily Bactrim for secondary prophylaxis against PCP.      Relevant Orders   HIV-1 RNA quant-no reflex-bld   T-helper cell (CD4)- ([facility] clinic only)   Cryptococcal meningitis ([place])    Continue fluconazole at current dose through June until he sees Dr. Johnnye Sima again.  We will reduce back to 200 mg daily for secondary prophylaxis  x12 months and CD4 reconstitution greater than 200.       HIV (human immunodeficiency virus infection) ([place])    We spent a majority of the visit reviewing his lab work and the natural history  and progression of HIV infection.  Discussed the difference between HIV and AIDS status.  He needs to tighten up his adherence with his Biktarvy.  I expressed my concern with regards to emergence of resistance that will prohibit [place] from treating him easily for the future.  I am not certain of his current male partner/wife is HIV positive but encouraged to get tested.  We will repeat viral load and CD4 count today.  Continue Biktarvy once daily.  I brought him a pill tray today and suggested that he use it.  I think he will do better taking his Biktarvy in the evening based on what we discussed today as he is more likely to take with food and eliminate the nausea this way. Return to clinic as currently scheduled to see Dr. Johnnye Sima.      Long term (current) use of antibiotics    Will check hepatic function panel while on fluconazole therapy.      Relevant Orders   Hepatic function panel      Janene Madeira, MSN, NP-C [facility] [place] Pager: [phone] Office: [phone]  09/05/18  5:23 PM

## 2018-09-05 ENCOUNTER — Encounter: Payer: Self-pay | Admitting: Infectious Diseases

## 2018-09-05 ENCOUNTER — Ambulatory Visit: Payer: BLUE CROSS/BLUE SHIELD | Admitting: Infectious Diseases

## 2018-09-05 ENCOUNTER — Other Ambulatory Visit: Payer: Self-pay

## 2018-09-05 VITALS — BP 120/85 | HR 85 | Temp 98.3°F | Wt 164.0 lb

## 2018-09-05 DIAGNOSIS — B451 Cerebral cryptococcosis: Secondary | ICD-10-CM | POA: Diagnosis not present

## 2018-09-05 DIAGNOSIS — B20 Human immunodeficiency virus [HIV] disease: Secondary | ICD-10-CM | POA: Diagnosis not present

## 2018-09-05 DIAGNOSIS — Z792 Long term (current) use of antibiotics: Secondary | ICD-10-CM | POA: Insufficient documentation

## 2018-09-05 DIAGNOSIS — Z21 Asymptomatic human immunodeficiency virus [HIV] infection status: Secondary | ICD-10-CM | POA: Insufficient documentation

## 2018-09-05 NOTE — Assessment & Plan Note (Signed)
Continue fluconazole at current dose through June until he sees Dr. Ninetta Lights again.  We will reduce back to 200 mg daily for secondary prophylaxis x12 months and CD4 reconstitution greater than 200.

## 2018-09-05 NOTE — Patient Instructions (Signed)
Very nice to meet you today.  Please continue taking your Biktarvy (little red pill), fluconazole (small pink pill), Bactrim (white pill) every day. Continue your azithromycin once a week as well.  Tips for Successful Daily Medication Habits: 1. Set a reminder on your phone  2. Try filling out a pill box for the week - pick a day and put one pill for every day during the week so you know right away if you missed a pill.  3. Have a trusted family member ask you about your medications.  4. Smartphone app   Remember every pill is precious and we want to be able to continue treating your with confidence the rest of your life.   We will repeat your blood work today to repeat assess your viral load and CD4 count.  I suspect that your headaches will continue to improve as we continue your treatment.  I am glad to hear that your breathing is better.  Please return at your current scheduled appointment with Dr. Ninetta Lights and call us in the meantime if you need anything before your visit.

## 2018-09-05 NOTE — Assessment & Plan Note (Signed)
Will check hepatic function panel while on fluconazole therapy.

## 2018-09-05 NOTE — Assessment & Plan Note (Signed)
We spent a majority of the visit reviewing his lab work and the natural history and progression of HIV infection.  Discussed the difference between HIV and AIDS status.  He needs to tighten up his adherence with his Biktarvy.  I expressed my concern with regards to emergence of resistance that will prohibit [place] from treating him easily for the future.  I am not certain of his current male partner/wife is HIV positive but encouraged to get tested.  We will repeat viral load and CD4 count today.  Continue Biktarvy once daily.  I brought him a pill tray today and suggested that he use it.  I think he will do better taking his Biktarvy in the evening based on what we discussed today as he is more likely to take with food and eliminate the nausea this way. Return to clinic as currently scheduled to see Dr. Ninetta Lights.

## 2018-09-05 NOTE — Assessment & Plan Note (Signed)
CD4 count 1 month ago was at 14.  We will repeat his viral load and CD4 today.  He is still having some ongoing neck pain but it is intermittent.  Nothing like the headaches he was having before. Oral candidiasis is resolved. Continue weekly azithromycin, daily fluconazole for consolidation with cryptococcal meningitis and daily Bactrim for secondary prophylaxis against PCP.

## 2018-09-06 LAB — T-HELPER CELL (CD4) - (RCID CLINIC ONLY)
CD4 % Helper T Cell: 3 % — ABNORMAL LOW (ref 33–65)
CD4 T Cell Abs: <35 /uL — ABNORMAL LOW (ref 400–1790)

## 2018-09-11 LAB — HEPATIC FUNCTION PANEL
AG Ratio: 1.7 (calc) (ref 1.0–2.5)
ALT: 14 U/L (ref 9–46)
AST: 13 U/L (ref 10–40)
Albumin: 4.3 g/dL (ref 3.6–5.1)
Alkaline phosphatase (APISO): 70 U/L (ref 36–130)
Bilirubin, Direct: 0.1 mg/dL (ref 0.0–0.2)
Globulin: 2.5 g/dL (calc) (ref 1.9–3.7)
Indirect Bilirubin: 0.3 mg/dL (calc) (ref 0.2–1.2)
Total Bilirubin: 0.4 mg/dL (ref 0.2–1.2)
Total Protein: 6.8 g/dL (ref 6.1–8.1)

## 2018-09-11 LAB — HIV-1 RNA QUANT-NO REFLEX-BLD
HIV 1 RNA Quant: 78100 copies/mL — ABNORMAL HIGH
HIV-1 RNA Quant, Log: 4.89 Log copies/mL — ABNORMAL HIGH

## 2018-10-03 ENCOUNTER — Encounter: Payer: Self-pay | Admitting: Infectious Diseases

## 2018-10-03 ENCOUNTER — Ambulatory Visit (INDEPENDENT_AMBULATORY_CARE_PROVIDER_SITE_OTHER): Payer: BC Managed Care – PPO | Admitting: Infectious Diseases

## 2018-10-03 ENCOUNTER — Other Ambulatory Visit: Payer: Self-pay

## 2018-10-03 VITALS — BP 113/81 | HR 102 | Temp 99.3°F | Wt 161.0 lb

## 2018-10-03 DIAGNOSIS — Z23 Encounter for immunization: Secondary | ICD-10-CM

## 2018-10-03 DIAGNOSIS — B451 Cerebral cryptococcosis: Secondary | ICD-10-CM | POA: Diagnosis not present

## 2018-10-03 DIAGNOSIS — K089 Disorder of teeth and supporting structures, unspecified: Secondary | ICD-10-CM | POA: Insufficient documentation

## 2018-10-03 DIAGNOSIS — B20 Human immunodeficiency virus [HIV] disease: Secondary | ICD-10-CM | POA: Diagnosis not present

## 2018-10-03 NOTE — Addendum Note (Signed)
Addended by: Lenore Cordia on: 10/03/2018 04:47 PM   Modules accepted: Orders

## 2018-10-03 NOTE — Progress Notes (Signed)
   Subjective:    Patient ID: Christopher Henson, male    DOB: [birth year], 38 y.o.   MRN: [id number]  HPI 38 yo M with hx of HIV/AIDS dx 05-2018. He was also dx with crypto meningitis, thrush, and presumed to have PCP.  Partner was notified. She is doing "fine". He is not clear if she was ever tested.   He was seen in ID clinic in f/u 06-2018 and was started on biktarvy. He has been started on OI rx as well (azithro and bactrim). He was continued on his diflucan as well (12 weeks).   Today states he has been off his ART > 1 week, only taking bactrim and flucon.  "I ran out." Has been feeling ok. Denies sick exposures.  Works at [employer].   HIV 1 RNA Quant (copies/mL)  Date Value  09/05/2018 78,100 (H)  08/05/2018 653,000 (H)   CD4 T Cell Abs (/uL)  Date Value  09/05/2018 <35 (L)  08/05/2018 14 (L)  06/11/2018 10 (L)    Review of Systems  Constitutional: Negative for appetite change, chills, fever and unexpected weight change.  HENT: Negative for mouth sores.   Respiratory: Negative for cough and shortness of breath.   Gastrointestinal: Negative for constipation and diarrhea.  Genitourinary: Negative for difficulty urinating.  Psychiatric/Behavioral: Positive for sleep disturbance.  Please see HPI. All other systems reviewed and negative.      Objective:   Physical Exam Constitutional:      Appearance: Normal appearance.  HENT:     Mouth/Throat:     Mouth: Mucous membranes are moist.     Pharynx: No oropharyngeal exudate.  Eyes:     Extraocular Movements: Extraocular movements intact.     Pupils: Pupils are equal, round, and reactive to light.  Neck:     Musculoskeletal: Normal range of motion and neck supple.  Cardiovascular:     Rate and Rhythm: Normal rate and regular rhythm.  Pulmonary:     Effort: Pulmonary effort is normal.     Breath sounds: Normal breath sounds.  Abdominal:     General: Bowel sounds are normal. There is no distension.     Palpations: Abdomen  is soft.     Tenderness: There is no abdominal tenderness.  Neurological:     General: No focal deficit present.     Mental Status: He is alert.  Psychiatric:        Mood and Affect: Mood normal.           Assessment & Plan:

## 2018-10-03 NOTE — Assessment & Plan Note (Signed)
Will continue flucon til his CD4 is >100 Currently asx, FROM neck, no headaches.

## 2018-10-03 NOTE — Assessment & Plan Note (Addendum)
States he has Dental Appt  (July 5?) Will clarify

## 2018-10-03 NOTE — Assessment & Plan Note (Signed)
I asked that he bring his partner in for testing here He will go to pharmacy and refill his rx Continue bactrim, azithro.  Needs next Hep B vaccine, PCV 23 Offered/refused condoms.  Will see him back in 2 months.

## 2018-10-04 ENCOUNTER — Ambulatory Visit: Payer: BLUE CROSS/BLUE SHIELD | Admitting: Infectious Diseases

## 2018-10-07 ENCOUNTER — Telehealth: Payer: Self-pay | Admitting: *Deleted

## 2018-10-07 NOTE — Telephone Encounter (Signed)
Received request for Prior authorization for patient's biktarvy.  RN unable to get through on PA line [phone]) or [phone], or through [email or web address] on 6/25 and 6/29. RN received call from support services at [employer] [place], [place].  She will be following up for assistance [phone], [phone]). Landis Gandy, RN

## 2018-10-08 ENCOUNTER — Telehealth: Payer: Self-pay | Admitting: Pharmacy Technician

## 2018-10-08 NOTE — Telephone Encounter (Addendum)
[facility] Patient Advocate Encounter  Prior Authorization for [facility] [place] has been approved.    PA# [id number] Effective dates: 10/09/2018 through 10/07/2019.  Patient's pharmacy is aware.  Christopher Henson. [name] Patient [facility] Phone: [phone] Fax:  [phone]

## 2018-10-08 NOTE — Telephone Encounter (Signed)
Spoke with several representatives, received corrected patient information for prior authorization request. Completed pa via [email or web address].  Faxed office notes for documentation at their request. Landis Gandy, RN

## 2018-10-10 ENCOUNTER — Other Ambulatory Visit: Payer: Self-pay | Admitting: Pharmacist

## 2018-10-10 DIAGNOSIS — B20 Human immunodeficiency virus [HIV] disease: Secondary | ICD-10-CM

## 2018-10-10 MED ORDER — BICTEGRAVIR-EMTRICITAB-TENOFOV 50-200-25 MG PO TABS: 1.0000 | ORAL_TABLET | Freq: Every day | ORAL | 11 refills | Status: DC

## 2018-11-11 LAB — FUNGUS CULTURE WITH STAIN

## 2018-12-06 ENCOUNTER — Ambulatory Visit: Payer: BC Managed Care – PPO | Admitting: Infectious Diseases

## 2018-12-10 ENCOUNTER — Encounter: Payer: Self-pay | Admitting: Infectious Diseases

## 2018-12-10 ENCOUNTER — Other Ambulatory Visit: Payer: Self-pay

## 2018-12-10 ENCOUNTER — Ambulatory Visit: Payer: BC Managed Care – PPO | Admitting: Infectious Diseases

## 2018-12-10 VITALS — BP 119/80 | HR 92 | Temp 98.3°F

## 2018-12-10 DIAGNOSIS — F329 Major depressive disorder, single episode, unspecified: Secondary | ICD-10-CM | POA: Diagnosis not present

## 2018-12-10 DIAGNOSIS — K089 Disorder of teeth and supporting structures, unspecified: Secondary | ICD-10-CM

## 2018-12-10 DIAGNOSIS — B20 Human immunodeficiency virus [HIV] disease: Secondary | ICD-10-CM | POA: Diagnosis not present

## 2018-12-10 DIAGNOSIS — F32A Depression, unspecified: Secondary | ICD-10-CM | POA: Insufficient documentation

## 2018-12-10 NOTE — Assessment & Plan Note (Signed)
He is given number for THP and counseling by nursing.  He states he does not want to talk to counselor "It won't help".

## 2018-12-10 NOTE — Assessment & Plan Note (Signed)
Needs dental.  Nursing will do when he returns for FMLA report.

## 2018-12-10 NOTE — Assessment & Plan Note (Signed)
He is getting his labs done today.  wIll stop bactrim azithro if his CD4 has improved.  Offered/refused condoms.  Continue his current art rtc in 3-4 months.

## 2018-12-10 NOTE — Progress Notes (Signed)
   Subjective:    Patient ID: Christopher Henson, male    DOB: June 27, 1980, 38 y.o.   MRN: [id number]  HPI  38 yo M with hx of HIV/AIDS dx 05-2018. He was also dx with crypto meningitis, thrush, and presumed to have PCP.  Partner was notified. She is doing "fine". He is not clear if she was ever tested.   He was seen in ID clinic in f/u 06-2018 and was started on biktarvy. He has been started on OI rx as well (azithro and bactrim). He was continued on his diflucan as well (12 weeks).   He has been depressed. "I ain't gonna go off the bridge or anything". Feeling down and out. Sleeps poorly (2-3h). Eating well. No change in wt.  Works til [employer].  "Comes with the territory". Has had episodes of depression prior. Limited support.   Needs another rx for biktarvy. (Reviewed). Taking bactrim, out of azithro.   Smoking 1.25 ppd.   HIV 1 RNA Quant (copies/mL)  Date Value  09/05/2018 78,100 (H)  08/05/2018 653,000 (H)   CD4 T Cell Abs (/uL)  Date Value  09/05/2018 <35 (L)  08/05/2018 14 (L)  06/11/2018 10 (L)    Review of Systems  Constitutional: Negative for appetite change, chills, fever and unexpected weight change.  Respiratory: Negative for cough and shortness of breath.   Gastrointestinal: Negative for diarrhea and nausea.  Genitourinary: Negative for difficulty urinating.  Psychiatric/Behavioral: Positive for dysphoric mood and sleep disturbance. Negative for suicidal ideas.       Objective:   Physical Exam Constitutional:      Appearance: Normal appearance.  HENT:     Mouth/Throat:     Mouth: Mucous membranes are moist.     Dentition: Dental caries present.  Eyes:     Extraocular Movements: Extraocular movements intact.     Pupils: Pupils are equal, round, and reactive to light.  Neck:     Musculoskeletal: Normal range of motion and neck supple.  Cardiovascular:     Rate and Rhythm: Normal rate and regular rhythm.  Pulmonary:     Effort: Pulmonary effort is normal.   Breath sounds: Normal breath sounds.  Abdominal:     General: Abdomen is flat. Bowel sounds are normal. There is no distension.     Palpations: Abdomen is soft.     Tenderness: There is no abdominal tenderness.  Musculoskeletal:     Right lower leg: No edema.     Left lower leg: No edema.  Neurological:     General: No focal deficit present.     Mental Status: He is alert.  Psychiatric:        Mood and Affect: Mood is depressed. Affect is not blunt, angry or tearful.           Assessment & Plan:

## 2018-12-11 ENCOUNTER — Telehealth: Payer: Self-pay | Admitting: *Deleted

## 2018-12-11 LAB — T-HELPER CELL (CD4) - (RCID CLINIC ONLY)
CD4 % Helper T Cell: 8 % — ABNORMAL LOW (ref 33–65)
CD4 T Cell Abs: 103 /uL — ABNORMAL LOW (ref 400–1790)

## 2018-12-11 NOTE — Telephone Encounter (Signed)
Per Christopher Henson called the patient to advised lab results. Advised him he can stop taking his azithromycin but continue to take the Bactrim as prescribed. He voiced understanding of the plan.

## 2018-12-11 NOTE — Telephone Encounter (Signed)
-----   Message from Jeffrey C Hatcher, MD sent at 12/11/2018 11:10 AM EDT ----- Can you let Mr Beaulieu know that his medication is working and that he does not need to take the azithromycin- 2 pills, once a week.  He still needs to take the bactrim Thanks! ----- Message ----- From: Interface, Lab In [place] Sent: 12/11/2018  10:45 AM EDT To: Jeffrey C Hatcher, MD   

## 2018-12-11 NOTE — Telephone Encounter (Signed)
RN relayed to patient. He states he is out of the azithromycin - will not refill this at the pharmacy today.   He does want to know if he needs to continue the fluconazole - please advise, as he will need to pick up refills of this today if so. Landis Gandy, RN

## 2018-12-11 NOTE — Telephone Encounter (Signed)
-----   Message from Campbell Riches, MD sent at 12/11/2018 11:10 AM EDT ----- Can you let Mr [place] know that his medication is working and that he does not need to take the azithromycin- 2 pills, once a week.  He still needs to take the bactrim Thanks! ----- Message ----- From: Interface, Lab In [place] Sent: 12/11/2018  10:45 AM EDT To: Campbell Riches, MD

## 2018-12-12 NOTE — Telephone Encounter (Signed)
Relayed instructions for him to stop fluconazole. He will bring FMLA forms next week.

## 2018-12-12 NOTE — Telephone Encounter (Signed)
He does not need the flucon Thanks!

## 2018-12-13 NOTE — Telephone Encounter (Signed)
Thanks

## 2018-12-25 ENCOUNTER — Other Ambulatory Visit: Payer: Self-pay | Admitting: Infectious Diseases

## 2018-12-25 DIAGNOSIS — B20 Human immunodeficiency virus [HIV] disease: Secondary | ICD-10-CM

## 2018-12-25 LAB — HIV-1 GENOTYPE: HIV-1 Genotype: DETECTED — AB

## 2018-12-25 LAB — HIV-1 INTEGRASE GENOTYPE

## 2018-12-25 LAB — HIV-1 RNA ULTRAQUANT REFLEX TO GENTYP+
HIV 1 RNA Quant: 275000 copies/mL — ABNORMAL HIGH
HIV-1 RNA Quant, Log: 5.44 Log copies/mL — ABNORMAL HIGH

## 2018-12-25 MED ORDER — DOLUTEGRAVIR SODIUM 50 MG PO TABS: 50.0000 mg | ORAL_TABLET | Freq: Every day | ORAL | 0 refills | Status: DC

## 2018-12-25 MED ORDER — EMTRICITABINE-TENOFOVIR AF 200-25 MG PO TABS: 1.0000 | ORAL_TABLET | Freq: Every day | ORAL | 0 refills | Status: DC

## 2018-12-25 NOTE — Progress Notes (Signed)
Pt needs to be changed to dolutegravir 50mg  qday and descovey 1 qday thanks

## 2018-12-26 ENCOUNTER — Telehealth: Payer: Self-pay | Admitting: Pharmacy Technician

## 2018-12-26 DIAGNOSIS — B20 Human immunodeficiency virus [HIV] disease: Secondary | ICD-10-CM

## 2018-12-26 MED ORDER — EMTRICITABINE-TENOFOVIR AF 200-25 MG PO TABS: 1.0000 | ORAL_TABLET | Freq: Every day | ORAL | 2 refills | Status: DC

## 2018-12-26 MED ORDER — DOLUTEGRAVIR SODIUM 50 MG PO TABS: 50.0000 mg | ORAL_TABLET | Freq: Every day | ORAL | 2 refills | Status: DC

## 2018-12-26 NOTE — Progress Notes (Signed)
RN notified patient, sent in new refills of tivicay/descovy and cancelled biktarvy with pharmacy. RN reached out to [place] in  Pharmacy for copay card assistance.  RN scheduled patient to follow up with labs in 6 weeks, office visit in 2 months. Landis Gandy, RN

## 2018-12-26 NOTE — Telephone Encounter (Signed)
[facility] Patient Advocate Encounter   Was successful in obtaining a [employer] copay card for Tivicay. This copay card will make the patients copay $0.  The billing information is as follows and has been shared with [facility]: [id number] PCN: Loyalty Member ID: [id number] Group ID: [id number]  Christopher Henson, Christopher Henson Patient [facility] for [facility] Phone: [phone] Fax: [phone] 12/26/2018 11:58 AM

## 2018-12-26 NOTE — Telephone Encounter (Signed)
Descovy requires prior authorization, RN initiated via phone call to [facility] [phone]). Reference [id number]. RN asked Cassie for assistance.

## 2018-12-26 NOTE — Addendum Note (Signed)
Addended by: Landis Gandy on: 12/26/2018 11:12 AM   Modules accepted: Orders

## 2019-01-01 ENCOUNTER — Other Ambulatory Visit: Payer: Self-pay

## 2019-01-01 ENCOUNTER — Ambulatory Visit: Payer: BC Managed Care – PPO | Admitting: Medical

## 2019-01-01 ENCOUNTER — Encounter: Payer: Self-pay | Admitting: Medical

## 2019-01-01 VITALS — BP 120/72 | HR 86 | Temp 97.8°F | Ht 71.5 in | Wt 162.2 lb

## 2019-01-01 DIAGNOSIS — B20 Human immunodeficiency virus [HIV] disease: Secondary | ICD-10-CM | POA: Diagnosis not present

## 2019-01-01 DIAGNOSIS — F329 Major depressive disorder, single episode, unspecified: Secondary | ICD-10-CM | POA: Diagnosis not present

## 2019-01-01 DIAGNOSIS — E43 Unspecified severe protein-calorie malnutrition: Secondary | ICD-10-CM

## 2019-01-01 DIAGNOSIS — G4700 Insomnia, unspecified: Secondary | ICD-10-CM

## 2019-01-01 MED ORDER — TRAZODONE HCL 50 MG PO TABS: 25.0000 mg | ORAL_TABLET | Freq: Every evening | ORAL | 1 refills | Status: DC | PRN

## 2019-01-01 NOTE — Progress Notes (Signed)
Subjective:  Christopher Henson is a 38 y.o. male who presents for Chief Complaint  Patient presents with  . New Patient (Initial Visit)     Here as a new patient.  He was not seeing routine medical care in general until earlier this year when he went to the hospital for severe headache.  He was diagnosed with meningitis and HIV.  He has been seeing infectious disease since then.  He is not 100% sure what medicine he is taking right now as there has been some insurance issue with the most recent change in medication.  He thinks he is taking this Descovy.  He has been sexually active with his girlfriend.  He says she went to the health department to get tested a still waiting on her results.  This was supposedly this past week.   His main concern today is sleep issues.  For the past year or more he has had problems getting and staying asleep.  He works a second/third shift.  He does worry about a lot of stuff in his anxiety about things including his HIV diagnosis.  Has depressed mood at times.  Denies SI/HI.  He declines medication for depression and currently does not want to see a counselor for this.  Sometimes snores, no witnessed apnea.  No family history of sleep apnea  Works 2nd shift, [employer].  He is eating 3 times a day currently.  Eats a variety of foods.  He was quite a bit heavier before the diagnosis earlier in the year.   No other aggravating or relieving factors.    No other c/o.  The following portions of the patient's history were reviewed and updated as appropriate: allergies, current medications, past family history, past medical history, past social history, past surgical history and problem list.  ROS Otherwise as in subjective above  Past Medical History:  Diagnosis Date  . AIDS (acquired immune deficiency syndrome) ([place])   . Alcohol abuse   . 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 GERD (gastroesophageal reflux disease)   . Thrombocytopenia ([place])    Current  Outpatient Medications on File Prior to Visit  Medication Sig Dispense Refill  . dolutegravir (TIVICAY) 50 MG tablet Take 1 tablet (50 mg total) by mouth daily. 30 tablet 2  . emtricitabine-tenofovir AF (DESCOVY) 200-25 MG tablet Take 1 tablet by mouth daily. 30 tablet 2  . acetaminophen (TYLENOL) 325 MG tablet Take 2 tablets (650 mg total) by mouth every 6 (six) hours as needed for mild pain (or Fever >/= 101). (Patient not taking: Reported on 01/01/2019)    . feeding supplement, ENSURE ENLIVE, (ENSURE ENLIVE) LIQD Take 237 mLs by mouth 2 (two) times daily between meals. (Patient not taking: Reported on 12/10/2018) 237 mL 12  . fluconazole (DIFLUCAN) 200 MG tablet Take 2 tablets (400 mg total) by mouth daily. (Patient not taking: Reported on 01/01/2019) 60 tablet 3  . Multiple Vitamin (MULTIVITAMIN WITH MINERALS) TABS tablet Take 1 tablet by mouth daily. (Patient not taking: Reported on 01/01/2019) 30 tablet 0  . mupirocin cream (BACTROBAN) 2 % Apply topically 4 (four) times daily. To left ear (Patient not taking: Reported on 10/03/2018) 15 g 0  . nystatin (MYCOSTATIN) 100000 UNIT/ML suspension Take 5 mLs (500,000 Units total) by mouth 4 (four) times daily. Swish and spit this medication (Patient not taking: Reported on 10/03/2018) 60 mL 0  . ondansetron (ZOFRAN ODT) 4 MG disintegrating tablet Take 1 tablet (4 mg total) by mouth every 8 (  eight) hours as needed for nausea or vomiting. (Patient not taking: Reported on 10/03/2018) 20 tablet 0  . pantoprazole (PROTONIX) 40 MG tablet Take 1 tablet (40 mg total) by mouth daily. (Patient not taking: Reported on 10/03/2018) 30 tablet 0  . sulfamethoxazole-trimethoprim (BACTRIM DS) 800-160 MG tablet Take 1 tablet by mouth daily. (Patient not taking: Reported on 01/01/2019) 30 tablet 5   No current facility-administered medications on file prior to visit.     Objective: BP 120/72   Pulse 86   Temp 97.8 F (36.6 C)   Ht 5' 11.5" (1.816 m)   Wt 162 lb 3.2 oz (73.6  kg)   SpO2 98%   BMI 22.31 kg/m   General appearance: alert, no distress, well developed, well nourished Neck: supple, no lymphadenopathy, no thyromegaly, no masses Heart: RRR, normal S1, S2, no murmurs Lungs: CTA bilaterally, no wheezes, rhonchi, or rales Pulses: 2+ radial pulses, 2+ pedal pulses, normal cap refill Ext: no edema Psych: seems down in mood, but answers questions approrpriately    Assessment: Encounter Diagnoses  Name Primary?  . Insomnia, unspecified type Yes  . Symptomatic HIV infection ([place])   . AIDS ([place])   . Protein-calorie malnutrition, severe   . Reactive depression      Plan: We discussed his past year with new diagnosis and concerns.  We discussed his insomnia, issues related to sleep.  We discussed journaling or diary to help get his thoughts out of his mind which would allow him to rest.  I strongly encouraged him to go seek out counseling.  Gave a list of counselors.  He declines medicine for depression or anxiety.  We will start a trial of trazodone to help with sleep.  Discussed risk benefits and follow-up in 3 to 4 weeks.  We discussed diet, nutrients, getting healthy.  Advised he check in with infectious disease since there was little confusion on his medications today.  Strongly recommended he abstain for sex or use condoms as his girlfriend still does not have her results yet.  We also discussed that girlfriend could use HIV prophylactic medication if she is negative for HIV.  We discussed the importance of compliance with medication treatment and follow-up  I counseled on our role as primary care provider, discussed proper use of emergency department, when to call us vs seeing a specialist or going to the emergency department.   discussed well visits, sick visits, and after hours phone line.      Christopher Henson was seen today for new patient (initial visit).  Diagnoses and all orders for this visit:  Insomnia, unspecified type  Symptomatic HIV  infection ([place])  AIDS ([place])  Protein-calorie malnutrition, severe  Reactive depression  Other orders -     traZODone (DESYREL) 50 MG tablet; Take 0.5-1 tablets (25-50 mg total) by mouth at bedtime as needed for sleep.    Follow up: 2mofor physical

## 2019-01-01 NOTE — Patient Instructions (Signed)
Recommendations:  Begin trial of trazodone to help with sleep  Consider counseling.  I listed some counselors below given your diagnosis and mood trying to deal with this.  Follow-up with Dr. Algis Downs office to make sure you are on the right medication given the recent insurance issues.   You should see an eye doctor yearly for routine care  You should see a dentist for routine care  Please make sure your girlfriend gets her results for her HIV test.  Either do not have sex currently or use a condom.  She can potentially be prescribed a medicine for HIV prevention if she is HIV negative.  If she does not have a primary care provider that recommend she come here or somewhere she can get hooked in for routine care like our office.   Lets follow-up in a month for physical  Eat at least 3 meals daily with a variety of fruits, vegetables, grains, and lean meats.      RESOURCES in [place], [place]  If you are experiencing a mental health crisis or an emergency, please call 911 or go to the nearest emergency department.  [facility]   [phone] [facility]  [phone] [facility]   [phone]  Suicide Hotline 1-800-Suicide 986-372-4152)  National Suicide Prevention Lifeline 438-687-9282  240 863 3046)  Domestic Violence, Rape/Crisis - [place] 6718356530  The [employer] Violence Hotline 1-800-799-SAFE 309-086-4222)  To report Child or Elder Abuse, please call: [facility] Police Department  [phone] [facility]  [place] [phone]  Teen Crisis line [phone] or [phone]     Psychiatry and Counseling services  [facility] [place], [place], [place] [address] [phone]  Lina Sayre, therapist Dr. Lynder Parents, psychiatrist Dr. Milana Huntsman, child psychiatrist   Dr. Launa Flight [address], [place], [place] [address] [phone]   Dr. Chucky May, psychiatry [address] Carolynne Edouard [place], [place] [address] [phone]   [facility] [place], [place], [place] [address] [phone]   [facility], [place], [place] [address] [phone]    Counseling Services (NON- psychiatrist offices)  [facility] [address] [place], [place] [address] [phone]   [facility] [phone] [place], [place], [place] [address]   [facility] [phone]  [email or web address] [address], [place], [place], [place] [address]   Merrianne M. Clarene Reamer, therapist [phone] [address] [place], [place] [address]   [facility] [phone] [address], [place], [place] [address]   Vic Ripper, therapist [phone] [address], [place], [place] [address]   [facility] [address] [place], [place], [place] [address]

## 2019-01-03 NOTE — Telephone Encounter (Signed)
Per [facility] tivicay and descovy denied, cost exceeds maximum.  PA needs to be done through [employer] instead of [place].  MD line [phone], [email or web address].  [facility] unable to initiate through [facility]mymeds.  Patient picked up [place] without coverage issue on 9/11.  Please advise. Landis Gandy, RN

## 2019-01-06 NOTE — Telephone Encounter (Signed)
Continue bictarvy thanks

## 2019-01-07 MED ORDER — BIKTARVY 50-200-25 MG PO TABS: 1.0000 | ORAL_TABLET | Freq: Every day | ORAL | 3 refills | Status: DC

## 2019-01-07 NOTE — Addendum Note (Signed)
Addended by: Landis Gandy on: 01/07/2019 11:14 AM   Modules accepted: Orders

## 2019-01-14 NOTE — Telephone Encounter (Signed)
Left message asking patient to call back. Need to confirm with him that [place] Odor is ok to continue, that he should refill that [facility] local [facility].  RN confirmed current regimen of [facility] only with [facility], sent in new prescription and cancelled tivicay/descovy.  They were also reaching out to the patient to let him know of the changes. Landis Gandy, RN

## 2019-01-15 ENCOUNTER — Telehealth: Payer: Self-pay | Admitting: *Deleted

## 2019-01-15 NOTE — Telephone Encounter (Signed)
-----   Message from [place] sent at 01/14/2019  3:16 PM EDT ----- Vicente Serene, ma'am I believe you may have called Marco he just called back and I told him I would get someone to call him back. Thank you much! :)

## 2019-01-15 NOTE — Telephone Encounter (Signed)
Returned Christopher Henson's call back, left message asking him to call me again.  Trying to relay that he DOES need to stay on the original [place] that was prescribed, that he has refills of this at [facility].  Landis Gandy, RN

## 2019-01-24 ENCOUNTER — Other Ambulatory Visit: Payer: Self-pay | Admitting: Medical

## 2019-01-24 NOTE — Telephone Encounter (Signed)
[facility] trazodone for 90 days [facility]

## 2019-01-27 ENCOUNTER — Telehealth: Payer: Self-pay | Admitting: Medical

## 2019-01-27 NOTE — Telephone Encounter (Signed)
I received a refill request for Ambien.  I want to see him back for a physical in a month, so it is about that time.  Please schedule.  Also I need to discuss sleep in symptoms before we do any type of 90-day supply

## 2019-01-28 NOTE — Telephone Encounter (Signed)
Pt informed and got pt scheduled for CPE in Nov.

## 2019-01-29 ENCOUNTER — Telehealth: Payer: Self-pay | Admitting: *Deleted

## 2019-01-29 DIAGNOSIS — B20 Human immunodeficiency virus [HIV] disease: Secondary | ICD-10-CM

## 2019-01-29 DIAGNOSIS — B370 Candidal stomatitis: Secondary | ICD-10-CM

## 2019-01-29 MED ORDER — NYSTATIN 100000 UNIT/ML MT SUSP: 5.0000 mL | Freq: Four times a day (QID) | OROMUCOSAL | 0 refills | Status: DC

## 2019-01-29 NOTE — Telephone Encounter (Signed)
Thank you :)

## 2019-01-29 NOTE — Telephone Encounter (Signed)
Please refill nystatin, thanks jeff

## 2019-01-29 NOTE — Addendum Note (Signed)
Addended by: Landis Gandy on: 01/29/2019 03:14 PM   Modules accepted: Orders

## 2019-01-29 NOTE — Telephone Encounter (Signed)
Patient called [facility] pharmacy, RN was able to speak with him and confirm that his IS taking Biktarvy alone, knows to go ahead and refill this at [facility].  He follows up in clinic 10/29 for labs and 11/13 with Dr Johnnye Sima.  He is asking for refilll of nystatin for thrush symptoms, last CD4 = 103 9/1.  Please advise. Landis Gandy, RN

## 2019-02-06 ENCOUNTER — Other Ambulatory Visit: Payer: Self-pay | Admitting: Pharmacist

## 2019-02-06 ENCOUNTER — Other Ambulatory Visit: Payer: Self-pay

## 2019-02-06 ENCOUNTER — Other Ambulatory Visit: Payer: BC Managed Care – PPO

## 2019-02-06 ENCOUNTER — Ambulatory Visit: Payer: BC Managed Care – PPO | Admitting: *Deleted

## 2019-02-06 DIAGNOSIS — B20 Human immunodeficiency virus [HIV] disease: Secondary | ICD-10-CM

## 2019-02-06 DIAGNOSIS — B451 Cerebral cryptococcosis: Secondary | ICD-10-CM

## 2019-02-06 MED ORDER — FLUCONAZOLE 200 MG PO TABS: 200.0000 mg | ORAL_TABLET | Freq: Every day | ORAL | 11 refills | Status: DC

## 2019-02-06 NOTE — Progress Notes (Signed)
Sending in fluconazole 200 mg Rx as patient should be on this for at least a year for secondary prophylaxis of cryptococcal meningitis.

## 2019-02-06 NOTE — Progress Notes (Signed)
Patient here for labs, also brought short term disability paperwork from his employer to address his hospitalization in February. RN made 2 copies for our records, gave him back the original so he can look up the job-specific information.  Patient would prefer that HIV/AIDS not be listed on the paperwork, but is ok with cryptococcal meningitis. He has been off his fluconazole, so Cassie sent in refills. RN spoke with patient regarding adherence to biktarvy.  He said he has been missing 1 biktarvy/week because he was not sure that he could take it if he was drinking alcohol.  Patient states he drinks about 1 day/week, because he works 5-6 days/week at [employer] (4:30pm - 3am).  Otherwise he typically takes the biktarvy when he gets off work.   RN encouraged patient to take biktarvy daily even if he is planning to drink alcohol that day. He will also pick up the fluconazole today and restart this.   RN placed both copies in Triage (brown accordion folder). Landis Gandy, RN

## 2019-02-07 LAB — T-HELPER CELL (CD4) - (RCID CLINIC ONLY)
CD4 % Helper T Cell: 9 % — ABNORMAL LOW (ref 33–65)
CD4 T Cell Abs: 150 /uL — ABNORMAL LOW (ref 400–1790)

## 2019-02-11 LAB — HIV-1 RNA QUANT-NO REFLEX-BLD
HIV 1 RNA Quant: 12800 copies/mL — ABNORMAL HIGH
HIV-1 RNA Quant, Log: 4.11 Log copies/mL — ABNORMAL HIGH

## 2019-02-12 ENCOUNTER — Telehealth: Payer: Self-pay

## 2019-02-12 NOTE — Telephone Encounter (Signed)
Called patient to inform him MD has completed his short term disability paper work. Patient was notified and will come into office to pick up paper work. [place]

## 2019-02-21 ENCOUNTER — Encounter: Payer: Self-pay | Admitting: Infectious Diseases

## 2019-02-21 ENCOUNTER — Other Ambulatory Visit: Payer: Self-pay

## 2019-02-21 ENCOUNTER — Encounter: Payer: BC Managed Care – PPO | Admitting: Infectious Diseases

## 2019-02-21 ENCOUNTER — Ambulatory Visit: Payer: BC Managed Care – PPO | Admitting: Infectious Diseases

## 2019-02-21 VITALS — BP 128/89 | HR 82 | Temp 98.0°F

## 2019-02-21 DIAGNOSIS — F1090 Alcohol use, unspecified, uncomplicated: Secondary | ICD-10-CM | POA: Insufficient documentation

## 2019-02-21 DIAGNOSIS — B451 Cerebral cryptococcosis: Secondary | ICD-10-CM | POA: Diagnosis not present

## 2019-02-21 DIAGNOSIS — L299 Pruritus, unspecified: Secondary | ICD-10-CM | POA: Diagnosis not present

## 2019-02-21 DIAGNOSIS — B20 Human immunodeficiency virus [HIV] disease: Secondary | ICD-10-CM | POA: Diagnosis not present

## 2019-02-21 DIAGNOSIS — Z789 Other specified health status: Secondary | ICD-10-CM

## 2019-02-21 DIAGNOSIS — Z7289 Other problems related to lifestyle: Secondary | ICD-10-CM

## 2019-02-21 MED ORDER — TRIAMCINOLONE ACETONIDE 0.5 % EX OINT: 1.0000 "application " | TOPICAL_OINTMENT | Freq: Two times a day (BID) | CUTANEOUS | 1 refills | Status: DC

## 2019-02-21 NOTE — Assessment & Plan Note (Signed)
Failed over-the-counter hydrocortisone, will prescribe triamcinolone twice daily to right forearm dry scaled rash.

## 2019-02-21 NOTE — Assessment & Plan Note (Signed)
I think he understands that his drinking is a problem but is not quite ready to take action yet.  Continue to offer support at clinic visits.

## 2019-02-21 NOTE — Assessment & Plan Note (Signed)
I worry that been since not taking his Biktarvy as often as he thinks especially in the setting of frequent binge drinking where he likely forgets.  We discussed tactics about using a pill tray, he will consider this.  We reviewed his viral loads he has been on medications for 6 months consistently now and I would have expected him to have been undetectable certainly on integrase-based regimen by now.  He will work on consistency with his medication and come back for repeat blood work in 2 months.  He seems like he would like to get this treated for his health but has some barriers he is unwilling to deal with currently.  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  May consider switch to [place] versus adding Prezcobix, however if his problem is taking pills this will not correct it.  We did briefly discuss injectable medication options that are coming in future of which I think he would be a great candidate for.   Return in about 2 months (around 04/23/2019). Will have him do labs before the visit to review for improvements in adherence.

## 2019-02-21 NOTE — Telephone Encounter (Signed)
Error

## 2019-02-21 NOTE — Assessment & Plan Note (Signed)
No active signs or symptoms of cardiac disease. Continue fluconazole for secondary prophylaxis until he completes one year following initial treatment (February 2021) + CD4 > 100 + virologically suppressed.

## 2019-02-21 NOTE — Patient Instructions (Signed)
Please every night get in your Fluconazole, Biktarvy and your Bactrim. I would have expected your blood work to be undetectable by now.   Consistency is what I want you to work on, I know you can do it.   Will have you back in 2 months to repeat your lab work then back to review results with Col[place] 2 weeks later.

## 2019-02-21 NOTE — Progress Notes (Signed)
Name: Christopher Henson  DOB: 04-01-1981 MRN: [id number] PCP: Carlena Hurl, PA-C    Brief Narrative:  Christopher Henson is a 38 y.o. 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 CD4 nadir 10, VL 1.23 million HIV Risk: MSM History of OIs: Cryptococcal meningitis, oral candidiasis Intake Labs June 09, 2018: Hep B sAg (-), sAb (-), cAb (not done); Hep A (not done), Hep C (-) Quantiferon (not done) HLA B*5701 (-) G6PD: ()   Previous Regimens: . Biktarvy  Genotypes: . 12-2018: M184V  Patient Active Problem List   Diagnosis Date Noted  . HIV (human immunodeficiency virus infection) ([place]) 09/05/2018    Priority: High  . AIDS [facility])     Priority: High  . Itchy skin 02/21/2019  . Alcohol use 02/21/2019  . Depression 12/10/2018  . Poor dentition 10/03/2018  . Long term (current) use of antibiotics 09/05/2018  . Protein-calorie malnutrition, severe 06/10/2018  . Cryptococcal meningitis (HCC)     Subjective:  CC: Follow-up for cryptococcal meningitis, AIDS.  Still having some neck pain.  HPI: Christopher Henson is here for follow-up on his HIV/AIDS.  He last saw Dr. Johnnye Sima in September 2020 he was not taking his Biktarvy consistently and still had a high viral load of 275,000 copies.  He has since been restarted on his fluconazole once a day for secondary prophylaxis against cryptococcal meningitis.  He says he misses a dose "here and there" but does not feel like it is more than once a week.  He does drink fairly heavily and describes about a pint or so of liquor a day.  He denies using any other illicit substances.  He does have trouble with depressed mood but not interested in any intervention at this time.    He states that fluconazole makes him feel funny, cannot quite elaborate on what this means, but feels like it is slowly starting to improve.  He has had no fevers, chills, weight change,  headaches, neck pain, vision or auditory changes.  He is taking his Bactrim once a day.  He takes all of his medications at night to be can sleep through some of the potential side effects.   Review of Systems  Constitutional: Negative for appetite change, chills, fatigue, fever and unexpected weight change.  Eyes: Negative for visual disturbance.  Respiratory: Negative for cough and shortness of breath.   Cardiovascular: Negative for chest pain and leg swelling.  Gastrointestinal: Negative for abdominal pain, diarrhea and nausea.  Genitourinary: Negative for discharge, dysuria and genital sores.  Musculoskeletal: Negative for joint swelling.  Skin: Negative for color change and rash.  Neurological: Negative for dizziness, tremors, weakness and headaches.  Hematological: Negative for adenopathy.  Psychiatric/Behavioral: Positive for dysphoric mood. Negative for sleep disturbance. The patient is not nervous/anxious.     Past Medical History:  Diagnosis Date  . AIDS (acquired immune deficiency syndrome) ([place])   . Alcohol abuse   . 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 GERD (gastroesophageal reflux disease)   . Thrombocytopenia ([place])     Outpatient Medications Prior to Visit  Medication Sig Dispense Refill  . bictegravir-emtricitabine-tenofovir AF (BIKTARVY) 50-200-25 MG TABS tablet Take 1 tablet by mouth daily. 30 tablet 3  . fluconazole (DIFLUCAN) 200 MG tablet Take 1 tablet (200 mg total) by mouth daily. 30 tablet 11  . sulfamethoxazole-trimethoprim (BACTRIM DS) 800-160 MG tablet Take 1 tablet by mouth daily. 30 tablet 5  . acetaminophen (  TYLENOL) 325 MG tablet Take 2 tablets (650 mg total) by mouth every 6 (six) hours as needed for mild pain (or Fever >/= 101). (Patient not taking: Reported on 01/01/2019)    . feeding supplement, ENSURE ENLIVE, (ENSURE ENLIVE) LIQD Take 237 mLs by mouth 2 (two) times daily between meals. (Patient not taking: Reported on 12/10/2018) 237 mL 12  .  mupirocin cream (BACTROBAN) 2 % Apply topically 4 (four) times daily. To left ear (Patient not taking: Reported on 10/03/2018) 15 g 0  . nystatin (MYCOSTATIN) 100000 UNIT/ML suspension Take 5 mLs (500,000 Units total) by mouth 4 (four) times daily. Swish and spit this medication 60 mL 0  . ondansetron (ZOFRAN ODT) 4 MG disintegrating tablet Take 1 tablet (4 mg total) by mouth every 8 (eight) hours as needed for nausea or vomiting. (Patient not taking: Reported on 10/03/2018) 20 tablet 0  . traZODone (DESYREL) 50 MG tablet Take 0.5-1 tablets (25-50 mg total) by mouth at bedtime as needed for sleep. 30 tablet 1  . Multiple Vitamin (MULTIVITAMIN WITH MINERALS) TABS tablet Take 1 tablet by mouth daily. (Patient not taking: Reported on 01/01/2019) 30 tablet 0  . pantoprazole (PROTONIX) 40 MG tablet Take 1 tablet (40 mg total) by mouth daily. (Patient not taking: Reported on 10/03/2018) 30 tablet 0   No facility-administered medications prior to visit.      No Known Allergies  Social History   Tobacco Use  . Smoking status: Current Every Day Smoker    Packs/day: 1.00    Types: Cigarettes  . Smokeless tobacco: Never Used  Substance Use Topics  . Alcohol use: Yes    Comment: 1/2 pint on weekend ; liquor  . Drug use: Yes    Frequency: 7.0 times per week    Types: Marijuana    Comment: daily    Family History  Problem Relation Age of Onset  . Diabetes Mother   . Hypertension Mother   . Diabetes Father   . Hypertension Father   . Healthy Sister   . Healthy Brother   . Healthy Sister   . Healthy Sister   . Healthy Sister   . Healthy Brother   . Healthy Brother     Social History   Substance and Sexual Activity  Sexual Activity Not Currently  . Partners: Female  . Birth control/protection: Condom   Comment: declined condoms 02/2019     Objective:   Vitals:   02/21/19 1141  BP: 128/89  Pulse: 82  Temp: 98 F (36.7 C)  TempSrc: Oral   There is no height or weight on file  to calculate BMI.  Physical Exam Constitutional:      Comments: Thin appearing, well developed male in no distress.   HENT:     Mouth/Throat:     Mouth: Mucous membranes are moist.     Pharynx: Oropharynx is clear.  Eyes:     General: No scleral icterus.    Pupils: Pupils are equal, round, and reactive to light.  Neck:     Musculoskeletal: Normal range of motion. No neck rigidity or muscular tenderness.  Cardiovascular:     Rate and Rhythm: Normal rate and regular rhythm.     Pulses: Normal pulses.     Heart sounds: Normal heart sounds. No murmur.  Pulmonary:     Effort: Pulmonary effort is normal.     Breath sounds: Normal breath sounds. No wheezing or rales.  Abdominal:     General: There is no distension.  Tenderness: There is no abdominal tenderness.  Lymphadenopathy:     Cervical: No cervical adenopathy.  Skin:    General: Skin is warm and dry.     Capillary Refill: Capillary refill takes less than 2 seconds.     Comments: Dry scaled hyperpigmented rash to right forearm near elbow fossa.   Neurological:     Mental Status: He is oriented to person, place, and time.  Psychiatric:     Comments: Flat affect.  Appears to have a dysphoric mood.     Lab Results Lab Results  Component Value Date   WBC 1.5 (L) 08/05/2018   HGB 12.8 (L) 08/05/2018   HCT 37.9 (L) 08/05/2018   MCV 88.6 08/05/2018   PLT 88 (L) 08/05/2018    Lab Results  Component Value Date   CREATININE 0.90 08/05/2018   BUN 13 08/05/2018   NA 143 08/05/2018   K 3.7 08/05/2018   CL 107 08/05/2018   CO2 29 08/05/2018    Lab Results  Component Value Date   ALT 14 09/05/2018   AST 13 09/05/2018   ALKPHOS 78 06/20/2018   BILITOT 0.4 09/05/2018    Lab Results  Component Value Date   CHOL 131 06/10/2018   HDL 19 (L) 06/10/2018   LDLCALC 73 06/10/2018   TRIG 195 (H) 06/10/2018   CHOLHDL 6.9 06/10/2018   HIV 1 RNA Quant (copies/mL)  Date Value  02/06/2019 12,800 (H)  12/10/2018 275,000 (H)   09/05/2018 78,100 (H)   CD4 T Cell Abs (/uL)  Date Value  02/06/2019 150 (L)  12/10/2018 103 (L)  09/05/2018 <35 (L)     Assessment & Plan:   Problem List Items Addressed This Visit      High   AIDS ([place]) - Primary    He has no signs of opportunistic infection on exam today.  We discussed prophylaxis that he needs to continue.  Most recent CD4 count was 150 indicating that he is at least taking his medicine sometimes since this is improved since less than 50.  He will continue his Bactrim once daily and fluconazole once daily.      HIV (human immunodeficiency virus infection) ([place]) (Chronic)    I worry that been since not taking his Biktarvy as often as he thinks especially in the setting of frequent binge drinking where he likely forgets.  We discussed tactics about using a pill tray, he will consider this.  We reviewed his viral loads he has been on medications for 6 months consistently now and I would have expected him to have been undetectable certainly on integrase-based regimen by now.  He will work on consistency with his medication and come back for repeat blood work in 2 months.  He seems like he would like to get this treated for his health but has some barriers he is unwilling to deal with currently.  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  May consider switch to [place] versus adding Prezcobix, however if his problem is taking pills this will not correct it.  We did briefly discuss injectable medication options that are coming in future of which I think he would be a great candidate for.   Return in about 2 months (around 04/23/2019). Will have him do labs before the visit to review for improvements in adherence.       Relevant Orders   HIV 1 RNA quant-no reflex-bld  T-helper cell (CD4)- ([facility] clinic only)     Unprioritized   Alcohol use    I think he understands that  his drinking is a problem but is not quite ready to take action yet.  Continue to offer support at clinic visits.      Cryptococcal meningitis ([place])    No active signs or symptoms of cardiac disease. Continue fluconazole for secondary prophylaxis until he completes one year following initial treatment (February 2021) + CD4 > 100 + virologically suppressed.       Itchy skin    Failed over-the-counter hydrocortisone, will prescribe triamcinolone twice daily to right forearm dry scaled rash.      Relevant Medications   triamcinolone ointment (KENALOG) 0.5 %      Janene Madeira, MSN, NP-C [facility] [place] Pager: [phone] Office: [phone]  02/21/19  2:37 PM

## 2019-02-21 NOTE — Assessment & Plan Note (Signed)
He has no signs of opportunistic infection on exam today.  We discussed prophylaxis that he needs to continue.  Most recent CD4 count was 150 indicating that he is at least taking his medicine sometimes since this is improved since less than 50.  He will continue his Bactrim once daily and fluconazole once daily.

## 2019-02-24 ENCOUNTER — Other Ambulatory Visit: Payer: Self-pay | Admitting: Medical

## 2019-02-25 ENCOUNTER — Other Ambulatory Visit: Payer: Self-pay

## 2019-02-25 ENCOUNTER — Ambulatory Visit (INDEPENDENT_AMBULATORY_CARE_PROVIDER_SITE_OTHER): Payer: BC Managed Care – PPO | Admitting: Medical

## 2019-02-25 ENCOUNTER — Encounter: Payer: Self-pay | Admitting: Medical

## 2019-02-25 VITALS — BP 120/82 | HR 83 | Temp 97.8°F | Ht 72.0 in | Wt 160.8 lb

## 2019-02-25 DIAGNOSIS — K089 Disorder of teeth and supporting structures, unspecified: Secondary | ICD-10-CM | POA: Diagnosis not present

## 2019-02-25 DIAGNOSIS — D696 Thrombocytopenia, unspecified: Secondary | ICD-10-CM | POA: Insufficient documentation

## 2019-02-25 DIAGNOSIS — F329 Major depressive disorder, single episode, unspecified: Secondary | ICD-10-CM

## 2019-02-25 DIAGNOSIS — Z2821 Immunization not carried out because of patient refusal: Secondary | ICD-10-CM

## 2019-02-25 DIAGNOSIS — Z792 Long term (current) use of antibiotics: Secondary | ICD-10-CM

## 2019-02-25 DIAGNOSIS — Z0000 Encounter for general adult medical examination without abnormal findings: Secondary | ICD-10-CM

## 2019-02-25 DIAGNOSIS — R748 Abnormal levels of other serum enzymes: Secondary | ICD-10-CM | POA: Insufficient documentation

## 2019-02-25 DIAGNOSIS — Z7185 Encounter for immunization safety counseling: Secondary | ICD-10-CM

## 2019-02-25 DIAGNOSIS — B20 Human immunodeficiency virus [HIV] disease: Secondary | ICD-10-CM

## 2019-02-25 DIAGNOSIS — Z789 Other specified health status: Secondary | ICD-10-CM

## 2019-02-25 DIAGNOSIS — E43 Unspecified severe protein-calorie malnutrition: Secondary | ICD-10-CM

## 2019-02-25 DIAGNOSIS — Z23 Encounter for immunization: Secondary | ICD-10-CM | POA: Diagnosis not present

## 2019-02-25 DIAGNOSIS — Z7189 Other specified counseling: Secondary | ICD-10-CM

## 2019-02-25 DIAGNOSIS — Z125 Encounter for screening for malignant neoplasm of prostate: Secondary | ICD-10-CM

## 2019-02-25 DIAGNOSIS — Z7289 Other problems related to lifestyle: Secondary | ICD-10-CM

## 2019-02-25 HISTORY — DX: Immunization not carried out because of patient refusal: Z28.21

## 2019-02-25 NOTE — Progress Notes (Signed)
Subjective:   HPI  Christopher Henson is a 38 y.o. male who presents for Chief Complaint  Patient presents with  . Annual Exam    with fasting labs     Patient Care Team: Reianna Batdorf, Leward Quan as PCP - General (Family Medicine), recently established care here Sees dentist Sees eye doctor Dr. Bobby Rumpf and Janene Madeira, NP with infectious disease  Concerns: Drinks 1/2 pint liquor daily, and whole pint on weekends.  Has been a heavier drinker in the past.    Gets off work 3am, and appetite is usually best right after getting off work.  Eats 2 meals daily.    No new sexual partners since diagnosis of HIV in early part of this year.    Lives with sister and her kids .   Working full time at [employer]   Reviewed their medical, surgical, family, social, medication, and allergy history and updated chart as appropriate.  Past Medical History:  Diagnosis Date  . AIDS (acquired immune deficiency syndrome) ([place]) 06/2018  . Alcohol abuse   . Cryptococcal meningitis ([place]) 05/2018  . GERD (gastroesophageal reflux disease)   . Protein calorie malnutrition ([place])   . Thrombocytopenia ([place]) 06/2018    Past Surgical History:  Procedure Laterality Date  . LUMBAR PUNCTURE  06/2018   during eval for meningitis     Social History   Socioeconomic History  . Marital status: Single    Spouse name: Not on file  . Number of children: Not on file  . Years of education: Not on file  . Highest education level: Not on file  Occupational History  . Not on file  Social Needs  . Financial resource strain: Not on file  . Food insecurity    Worry: Not on file    Inability: Not on file  . Transportation needs    Medical: Not on file    Non-medical: Not on file  Tobacco Use  . Smoking status: Current Every Day Smoker    Packs/day: 1.00    Types: Cigarettes  . Smokeless tobacco: Never Used  Substance and Sexual Activity  . Alcohol use: Yes    Comment: 1/2 pint on weekend ;  liquor  . Drug use: Yes    Frequency: 7.0 times per week    Types: Marijuana    Comment: daily  . Sexual activity: Not Currently    Partners: Female    Birth control/protection: Condom    Comment: declined condoms 02/2019  Lifestyle  . Physical activity    Days per week: Not on file    Minutes per session: Not on file  . Stress: Not on file  Relationships  . Social Herbalist on phone: Not on file    Gets together: Not on file    Attends religious service: Not on file    Active member of club or organization: Not on file    Attends meetings of clubs or organizations: Not on file    Relationship status: Not on file  . Intimate partner violence    Fear of current or ex partner: Not on file    Emotionally abused: Not on file    Physically abused: Not on file    Forced sexual activity: Not on file  Other Topics Concern  . Not on file  Social History Narrative   Lives with sister and her kids.   Works at [employer].  Does physical labor on the job.   Has  86yo son in [place].   12/2018    Family History  Problem Relation Age of Onset  . Diabetes Mother   . Hypertension Mother   . Diabetes Father   . Hypertension Father   . Healthy Sister   . Healthy Brother   . Healthy Sister   . Healthy Sister   . Healthy Sister   . Healthy Brother   . Healthy Brother   . Heart disease Neg Hx   . Cancer Neg Hx   . Stroke Neg Hx      Current Outpatient Medications:  .  bictegravir-emtricitabine-tenofovir AF (BIKTARVY) 50-200-25 MG TABS tablet, Take 1 tablet by mouth daily., Disp: 30 tablet, Rfl: 3 .  fluconazole (DIFLUCAN) 200 MG tablet, Take 1 tablet (200 mg total) by mouth daily., Disp: 30 tablet, Rfl: 11 .  sulfamethoxazole-trimethoprim (BACTRIM DS) 800-160 MG tablet, Take 1 tablet by mouth daily., Disp: 30 tablet, Rfl: 5 .  acetaminophen (TYLENOL) 325 MG tablet, Take 2 tablets (650 mg total) by mouth every 6 (six) hours as needed for mild pain (or Fever >/= 101).  (Patient not taking: Reported on 01/01/2019), Disp: , Rfl:  .  feeding supplement, ENSURE ENLIVE, (ENSURE ENLIVE) LIQD, Take 237 mLs by mouth 2 (two) times daily between meals. (Patient not taking: Reported on 12/10/2018), Disp: 237 mL, Rfl: 12 .  mupirocin cream (BACTROBAN) 2 %, Apply topically 4 (four) times daily. To left ear (Patient not taking: Reported on 10/03/2018), Disp: 15 g, Rfl: 0 .  nystatin (MYCOSTATIN) 100000 UNIT/ML suspension, Take 5 mLs (500,000 Units total) by mouth 4 (four) times daily. Swish and spit this medication (Patient not taking: Reported on 02/25/2019), Disp: 60 mL, Rfl: 0 .  ondansetron (ZOFRAN ODT) 4 MG disintegrating tablet, Take 1 tablet (4 mg total) by mouth every 8 (eight) hours as needed for nausea or vomiting. (Patient not taking: Reported on 10/03/2018), Disp: 20 tablet, Rfl: 0 .  traZODone (DESYREL) 50 MG tablet, TAKE 0.5-1 TABLETS (25-50 MG TOTAL) BY MOUTH AT BEDTIME AS NEEDED FOR SLEEP. (Patient not taking: Reported on 02/25/2019), Disp: 30 tablet, Rfl: 1 .  triamcinolone ointment (KENALOG) 0.5 %, Apply 1 application topically 2 (two) times daily. (Patient not taking: Reported on 02/25/2019), Disp: 30 g, Rfl: 1  No Known Allergies    Review of Systems Constitutional: -fever, -chills, -sweats, -unexpected weight change, -decreased appetite, -fatigue Allergy: -sneezing, -itching, -congestion Dermatology: -changing moles, --rash, -lumps ENT: -runny nose, -ear pain, -sore throat, -hoarseness, -sinus pain, -teeth pain, - ringing in ears, -hearing loss, -nosebleeds Cardiology: -chest pain, -palpitations, -swelling, -difficulty breathing when lying flat, -waking up short of breath Respiratory: -cough, -shortness of breath, -difficulty breathing with exercise or exertion, -wheezing, -coughing up blood Gastroenterology: -abdominal pain, -nausea, -vomiting, -diarrhea, -constipation, -blood in stool, -changes in bowel movement, -difficulty swallowing or eating Hematology:  -bleeding, -bruising  Musculoskeletal: -joint aches, -muscle aches, -joint swelling, -back pain, -neck pain, -cramping, -changes in gait Ophthalmology: denies vision changes, eye redness, itching, discharge Urology: -burning with urination, -difficulty urinating, -blood in urine, -urinary frequency, -urgency, -incontinence Neurology: -headache, -weakness, -tingling, -numbness, -memory loss, -falls, -dizziness Psychology: -depressed mood, -agitation, -sleep problems Male GU: no testicular mass, pain, no lymph nodes swollen, no swelling, no rash.      Objective:  BP 120/82   Pulse 83   Temp 97.8 F (36.6 C)   Ht 6' (1.829 m)   Wt 160 lb 12.8 oz (72.9 kg)   SpO2 99%   BMI 21.81 kg/m  Wt Readings from Last 3 Encounters:  02/25/19 160 lb 12.8 oz (72.9 kg)  01/01/19 162 lb 3.2 oz (73.6 kg)  10/03/18 161 lb (73 kg)   General appearance: alert, no distress, WD/WN, African American male Skin: left forearm posterior horizontal scar from prior cut wound, no worrisome lesions HEENT: normocephalic, conjunctiva/corneas normal, sclerae anicteric, PERRLA, EOMi, nares patent, no discharge or erythema, pharynx normal Oral cavity: MMM, tongue normal, teeth normal Neck: supple, no lymphadenopathy, no thyromegaly, no masses, normal ROM, no bruits Chest: non tender, normal shape and expansion Heart: RRR, normal S1, S2, no murmurs Lungs: CTA bilaterally, no wheezes, rhonchi, or rales Abdomen: +bs, soft, non tender, non distended, no masses, no hepatomegaly, no splenomegaly, no bruits Back: non tender, normal ROM, no scoliosis Musculoskeletal: upper extremities non tender, no obvious deformity, normal ROM throughout, lower extremities non tender, no obvious deformity, normal ROM throughout Extremities: no edema, no cyanosis, no clubbing Pulses: 2+ symmetric, upper and lower extremities, normal cap refill Neurological: alert, oriented x 3, CN2-12 intact, strength normal upper extremities and lower  extremities, sensation normal throughout, DTRs 2+ throughout, no cerebellar signs, gait normal Psychiatric: normal affect, behavior normal, pleasant  GU: normal male external genitalia,circumcised, nontender, no masses, no hernia, no lymphadenopathy Rectal: deferred   Assessment and Plan :   Encounter Diagnoses  Name Primary?  . Encounter for health maintenance examination in adult Yes  . Poor dentition   . AIDS ([place])   . Symptomatic HIV infection ([place])   . Protein-calorie malnutrition, severe   . Long term (current) use of antibiotics   . Reactive depression   . Alcohol use   . Screening for prostate cancer   . Vaccine counseling   . Need for hepatitis A and B vaccination   . Elevated liver enzymes   . Thrombocytopenia ([place])   . Influenza vaccination declined     Physical exam - discussed and counseled on healthy lifestyle, diet, exercise, preventative care, vaccinations, sick and well care, proper use of emergency dept and after hours care, and addressed their concerns.    Health screening: See your eye doctor yearly for routine vision care. See your dentist yearly for routine dental care including hygiene visits twice yearly.  Cancer screening Advised monthly self testicular exam  Colonoscopy:  Age 50yo, sooner if anemia, blood in stool  Discussed PSA, prostate exam, and prostate cancer screening risks/benefits.      Vaccinations: Declines flu shot  Counseled on the Hepatitis A virus vaccine.  Vaccine information sheet given.  Hepatitis A vaccine given after consent obtained.    Counseled on the Hepatitis B/Heplisav virus vaccine.  Vaccine information sheet given.  Hepatitis B vaccine given after consent obtained.     Separate significant chronic issues discussed: Reviewed Infections Disease records, prior labs from this year with new HIV diagnosis, cryptococcal meningitis.   He has had some issues with medication compliance.  Advised he take his medications  every day without missing doses.   advised he cut back on alcohol intake.  counseled on dangers of excessive alcohol and substance abuse.   He has had elevated LFTs.  Recheck today, consider abdominal imaging.  HIV/AID managed by ID clinic  Counseled on diet, nutrition, gave handout.    Discussed safety in household, not sharing hygiene products, discussed safe sex, condom use, PreP medication for partners in the event he gets another partner going forward.  He is still frustrated after last girlfriend contact and getting HIV diagnosis.  We had discussed  last visit making sure prior partner got tested.  It wasn't clear last visit this had happened.    [place] was seen today for annual exam.  Diagnoses and all orders for this visit:  Encounter for health maintenance examination in adult -     TSH regular -     PSA -     CBC with Differential -     Comprehensive metabolic panel -     Lipid Panel -     Folate  Poor dentition -     Folate  AIDS (HCC) -     CBC with Differential -     Comprehensive metabolic panel  Symptomatic HIV infection (HCC)  Protein-calorie malnutrition, severe  Long term (current) use of antibiotics  Reactive depression  Alcohol use -     Folate  Screening for prostate cancer -     PSA  Vaccine counseling  Need for hepatitis A and B vaccination  Elevated liver enzymes  Thrombocytopenia (HCC) -     CBC with Differential  Influenza vaccination declined  Other orders -     Heplisav-B (HepB-CPG) Vaccine -     Hepatitis A vaccine adult IM    Follow-up pending labs, yearly for physical

## 2019-02-25 NOTE — Patient Instructions (Signed)
Thanks for trusting [place] with your health care and for coming in for a physical today.  Below are some general recommendations I have for you:  Yearly screenings See your eye doctor yearly for routine vision care. See your dentist yearly for routine dental care including hygiene visits twice yearly. See me here yearly for a routine physical and preventative care visit   Specific Concerns today:  . Establish with eye doctor . Follow up with dentist at their recommendation . Follow up with infectious disease clinic as scheculed . Be good about taking your medications EVERY DAY!!!!   Alcohol and Nutrition I recommend you decrease your alcohol consumption.  Excessive alcohol use in any pattern of alcohol consumption can harm your health, relationships, or work. Alcohol abuse can cause poor nutrition (malnutrition or malnourishment) and a lack of nutrients (nutrient deficiencies), which can lead to more complications. Alcohol abuse brings malnutrition and nutrient deficiencies in two ways:  It causes your liver to work abnormally. This affects how your body divides (breaks down) and absorbs nutrients from food.  It causes you to eat poorly. Many people who abuse alcohol do not eat enough carbohydrates, protein, fat, vitamins, and minerals. Nutrients that are commonly lacking (deficient) in people who abuse alcohol include:  Vitamins. ? Vitamin A. This is needed for your vision, metabolism, and ability to fight off infections (immunity). ? B vitamins. These include folate, thiamine, and niacin. These are needed for new cell growth. ? Vitamin C. This plays an important role in wound healing, immunity, and helping your body to absorb iron. ? Vitamin D. This is necessary for your body to absorb and use calcium. It is produced by your liver, but you can also get it from food and from sun exposure.  Minerals. ? Calcium. This is needed for healthy bones as well as heart and blood vessel  (cardiovascular) function. ? Iron. This is important for blood, muscle, and nervous system functioning. ? Magnesium. This plays an important role in muscle and nerve function, and it helps to control blood sugar and blood pressure. ? Zinc. This is important for the normal functioning of your nervous system and digestive system (gastrointestinal tract). If you think that you have an alcohol dependency problem, or if it is hard to stop drinking because you feel sick or different when you do not use alcohol, talk with your health care provider or another health professional about where to get help. Nutrition is an essential factor in therapy for alcohol abuse. Your health care provider or diet and nutrition specialist (dietitian) will work with you to design a plan that can help to restore nutrients to your body and prevent the risk of complications. What is my plan? Your dietitian may develop a specific eating plan that is based on your condition and any other problems that you have. An eating plan will commonly include:  A balanced diet. ? Grains: 6-8 oz (170-227 g) a day. Examples of 1 oz of whole grains include 1 cup of whole-wheat cereal,  cup of brown rice, or 1 slice of whole-wheat bread. ? Vegetables: 2-3 cups a day. Examples of 1 cup of vegetables include 2 medium carrots, 1 large tomato, or 2 stalks of celery. ? Fruits: 1-2 cups a day. Examples of 1 cup of fruit include 1 large banana, 1 small apple, 8 large strawberries, or 1 large orange. ? Meat and other protein: 5-6 oz (142-170 g) a day.  A cut of meat or fish that is the  size of a deck of cards is about 3-4 oz.  Foods that provide 1 oz of protein include 1 egg,  cup of nuts or seeds, or 1 tablespoon (16 g) of peanut butter. ? Dairy: 2-3 cups a day. Examples of 1 cup of dairy include 8 oz (230 mL) of milk, 8 oz (230 g) of yogurt, or 1 oz (44 g) of natural cheese.  Vitamin and mineral supplements. What are tips for following this  plan?  Eat frequent meals and snacks. Try to eat 5-6 small meals each day.  Take vitamin or mineral supplements as recommended by your dietitian.  If you are malnourished or if your dietitian recommends it: ? You may follow a high-protein, high-calorie diet. This may include:  2,000-3,000 calories (kilocalories) a day.  70-100 g (grams) of protein a day. ? You may be directed to follow a diet that includes a complete nutritional supplement beverage. This can help to restore calories, protein, and vitamins to your body. Depending on your condition, you may be advised to consume this beverage instead of your meals or in addition to them.  Certain medicines may cause changes in your appetite, taste, and weight. Work with your health care provider and dietitian to make any changes to your medicines and eating plan.  If you are unable to take in enough food and calories by mouth, your health care provider may recommend a feeding tube. This tube delivers nutritional supplements directly to your stomach. Recommended foods  Eat foods that are high in molecules that prevent oxygen from reacting with your food (antioxidants). These foods include grapes, berries, nuts, green tea, and dark green or orange vegetables. Eating these can help to prevent some of the stress that is placed on your liver by consuming alcohol.  Eat a variety of fresh fruits and vegetables each day. This will help you to get fiber and vitamins in your diet.  Drink plenty of water and other clear fluids, such as apple juice and broth. Try to drink at least 48-64 oz (1.5-2 L) of water a day.  Include foods fortified with vitamins and minerals in your diet. Commonly fortified foods include milk, orange juice, cereal, and bread.  Eat a variety of foods that are high in omega-3 and omega-6 fatty acids. These include fish, nuts and seeds, and soybeans. These foods may help your liver to recover and may also stabilize your mood.  If  you are a vegetarian: ? Eat a variety of protein-rich foods. ? Pair whole grains with plant-based proteins at meals and snack time. For example, eat rice with beans, put peanut butter on whole-grain toast, or eat oatmeal with sunflower seeds. The items listed above may not be a complete list of foods and beverages you can eat. Contact a dietitian for more information. Foods to avoid  Avoid foods and drinks that are high in fat and sugar. Sugary drinks, salty snacks, and candy contain empty calories. This means that they lack important nutrients such as protein, fiber, and vitamins.  Avoid alcohol. This is the best way to avoid malnutrition due to alcohol abuse. If you must drink, drink measured amounts. Measured drinking means limiting your intake to no more than 1 drink a day for nonpregnant women and 2 drinks a day for men. One drink equals 12 oz (355 mL) of beer, 5 oz (148 mL) of wine, or 1 oz (44 mL) of hard liquor.  Limit your intake of caffeine. Replace drinks like coffee and black tea  with decaffeinated coffee and decaffeinated herbal tea. The items listed above may not be a complete list of foods and beverages you should avoid. Contact a dietitian for more information. Summary  Alcohol abuse can cause poor nutrition (malnutrition or malnourishment) and a lack of nutrients (nutrient deficiencies), which can lead to more health problems.  Common nutrient deficiencies include vitamin deficiencies (A, B, C, and D) and mineral deficiencies (calcium, iron, magnesium, and zinc).  Nutrition is an essential factor in therapy for alcohol abuse.  Your health care provider and dietitian can help you to develop a specific eating plan that includes a balanced diet plus vitamin and mineral supplements. This information is not intended to replace advice given to you by your health care provider. Make sure you discuss any questions you have with your health care provider. Document Released: 01/19/2005  Document Revised: 07/16/2018 Document Reviewed: 12/12/2016 Elsevier Patient Education  [place].     Please follow up yearly for a physical.   Preventative Care for Adults - Male      [place]:  A routine yearly physical is a good way to check in with your primary care provider about your health and preventive screening. It is also an opportunity to share updates about your health and any concerns you have, and receive a thorough all-over exam.   Most health insurance companies pay for at least some preventative services.  Check with your health plan for specific coverages.  WHAT PREVENTATIVE SERVICES DO MEN NEED?  Adult men should have their weight and blood pressure checked regularly.   Men age 71 and older should have their cholesterol levels checked regularly.  Beginning at age 62 and continuing to age 36, men should be screened for colorectal cancer.  Certain people may need continued testing until age 15.  Updating vaccinations is part of preventative care.  Vaccinations help protect against diseases such as the flu.  Osteoporosis is a disease in which the bones lose minerals and strength as we age. Men ages 67 and over should discuss this with their caregivers  Lab tests are generally done as part of preventative care to screen for anemia and blood disorders, to screen for problems with the kidneys and liver, to screen for bladder problems, to check blood sugar, and to check your cholesterol level.  Preventative services generally include counseling about diet, exercise, avoiding tobacco, drugs, excessive alcohol consumption, and sexually transmitted infections.    GENERAL RECOMMENDATIONS FOR GOOD HEALTH:  Healthy diet:  Eat a variety of foods, including fruit, vegetables, animal or vegetable protein, such as meat, fish, chicken, and eggs, or beans, lentils, tofu, and grains, such as rice.  Drink plenty of water daily.  Decrease saturated  fat in the diet, avoid lots of red meat, processed foods, sweets, fast foods, and fried foods.  Exercise:  Aerobic exercise helps maintain good heart health. At least 30-40 minutes of moderate-intensity exercise is recommended. For example, a brisk walk that increases your heart rate and breathing. This should be done on most days of the week.   Find a type of exercise or a variety of exercises that you enjoy so that it becomes a part of your daily life.  Examples are running, walking, swimming, water aerobics, and biking.  For motivation and support, explore group exercise such as aerobic class, spin class, Zumba, Yoga,or  martial arts, etc.    Set exercise goals for yourself, such as a certain weight goal, walk or run in  a race such as a 5k walk/run.  Speak to your primary care provider about exercise goals.  Disease prevention:  If you smoke or chew tobacco, find out from your caregiver how to quit. It can literally save your life, no matter how long you have been a tobacco user. If you do not use tobacco, never begin.   Maintain a healthy diet and normal weight. Increased weight leads to problems with blood pressure and diabetes.   The Body Mass Index or BMI is a way of measuring how much of your body is fat. Having a BMI above 27 increases the risk of heart disease, diabetes, hypertension, stroke and other problems related to obesity. Your caregiver can help determine your BMI and based on it develop an exercise and dietary program to help you achieve or maintain this important measurement at a healthful level.  High blood pressure causes heart and blood vessel problems.  Persistent high blood pressure should be treated with medicine if weight loss and exercise do not work.   Fat and cholesterol leaves deposits in your arteries that can block them. This causes heart disease and vessel disease elsewhere in your body.  If your cholesterol is found to be high, or if you have heart disease or  certain other medical conditions, then you may need to have your cholesterol monitored frequently and be treated with medication.   Ask if you should have a cardiac stress test if your history suggests this. A stress test is a test done on a treadmill that looks for heart disease. This test can find disease prior to there being a problem.  Osteoporosis is a disease in which the bones lose minerals and strength as we age. This can result in serious bone fractures. Risk of osteoporosis can be identified using a bone density scan. Men ages 71 and over should discuss this with their caregivers. Ask your caregiver whether you should be taking a calcium supplement and Vitamin D, to reduce the rate of osteoporosis.   Avoid drinking alcohol in excess (more than two drinks per day).  Avoid use of street drugs. Do not share needles with anyone. Ask for professional help if you need assistance or instructions on stopping the use of alcohol, cigarettes, and/or drugs.  Brush your teeth twice a day with fluoride toothpaste, and floss once a day. Good oral hygiene prevents tooth decay and gum disease. The problems can be painful, unattractive, and can cause other health problems. Visit your dentist for a routine oral and dental check up and preventive care every 6-12 months.   Look at your skin regularly.  Use a mirror to look at your back. Notify your caregivers of changes in moles, especially if there are changes in shapes, colors, a size larger than a pencil eraser, an irregular border, or development of new moles.  Safety:  Use seatbelts 100% of the time, whether driving or as a passenger.  Use safety devices such as hearing protection if you work in environments with loud noise or significant background noise.  Use safety glasses when doing any work that could send debris in to the eyes.  Use a helmet if you ride a bike or motorcycle.  Use appropriate safety gear for contact sports.  Talk to your caregiver about  gun safety.  Use sunscreen with a SPF (or skin protection factor) of 15 or greater.  Lighter skinned people are at a greater risk of skin cancer. Don't forget to also wear sunglasses in order  to protect your eyes from too much damaging sunlight. Damaging sunlight can accelerate cataract formation.   Practice safe sex. Use condoms. Condoms are used for birth control and to help reduce the spread of sexually transmitted infections (or STIs).  Some of the STIs are gonorrhea (the clap), chlamydia, syphilis, trichomonas, herpes, HPV (human papilloma virus) and HIV (human immunodeficiency virus) which causes AIDS. The herpes, HIV and HPV are viral illnesses that have no cure. These can result in disability, cancer and death.   Keep carbon monoxide and smoke detectors in your home functioning at all times. Change the batteries every 6 months or use a model that plugs into the wall.   Vaccinations:  Stay up to date with your tetanus shots and other required immunizations. You should have a booster for tetanus every 10 years. Be sure to get your flu shot every year, since 5%-20% of the [place] population comes down with the flu. The flu vaccine changes each year, so being vaccinated once is not enough. Get your shot in the fall, before the flu season peaks.   Other vaccines to consider:  Human Papilloma Virus or HPV causes cancer of the cervix, and other infections that can be transmitted from person to person. There is a vaccine for HPV, and males should get immunized between the ages of 31 and 76. It requires a series of 3 shots.   Pneumococcal vaccine to protect against certain types of pneumonia.  This is normally recommended for adults age 29 or older.  However, adults younger than 40 years old with certain underlying conditions such as diabetes, heart or lung disease should also receive the vaccine.  Shingles vaccine to protect against Varicella Zoster if you are older than age 78, or younger than 40  years old with certain underlying illness.  If you have not had the Shingrix vaccine, please call your insurer to inquire about coverage for the Shingrix vaccine given in 2 doses.   Some insurers cover this vaccine after age 76, some cover this after age 19.  If your insurer covers this, then call to schedule appointment to have this vaccine here  Hepatitis A vaccine to protect against a form of infection of the liver by a virus acquired from food.  Hepatitis B vaccine to protect against a form of infection of the liver by a virus acquired from blood or body fluids, particularly if you work in health care.  If you plan to travel internationally, check with your local health department for specific vaccination recommendations.   What should I know about cancer screening? Many types of cancers can be detected early and may often be prevented. Lung Cancer  You should be screened every year for lung cancer if: ? You are a current smoker who has smoked for at least 30 years. ? You are a former smoker who has quit within the past 15 years.  Talk to your health care provider about your screening options, when you should start screening, and how often you should be screened.  Colorectal Cancer  Routine colorectal cancer screening usually begins at 40 years of age and should be repeated every 5-10 years until you are 40 years old. You may need to be screened more often if early forms of precancerous polyps or small growths are found. Your health care provider may recommend screening at an earlier age if you have risk factors for colon cancer.  Your health care provider may recommend using home test kits to check for  hidden blood in the stool.  A small camera at the end of a tube can be used to examine your colon (sigmoidoscopy or colonoscopy). This checks for the earliest forms of colorectal cancer.  Prostate and Testicular Cancer  Depending on your age and overall health, your health care  provider may do certain tests to screen for prostate and testicular cancer.  Talk to your health care provider about any symptoms or concerns you have about testicular or prostate cancer.  Skin Cancer  Check your skin from head to toe regularly.  Tell your health care provider about any new moles or changes in moles, especially if: ? There is a change in a mole's size, shape, or color. ? You have a mole that is larger than a pencil eraser.  Always use sunscreen. Apply sunscreen liberally and repeat throughout the day.  Protect yourself by wearing long sleeves, pants, a wide-brimmed hat, and sunglasses when outside.

## 2019-02-26 LAB — COMPREHENSIVE METABOLIC PANEL
ALT: 10 IU/L (ref 0–44)
AST: 13 IU/L (ref 0–40)
Albumin/Globulin Ratio: 1.7 (ref 1.2–2.2)
Albumin: 4.5 g/dL (ref 4.0–5.0)
Alkaline Phosphatase: 91 IU/L (ref 39–117)
BUN/Creatinine Ratio: 7 — ABNORMAL LOW (ref 9–20)
BUN: 8 mg/dL (ref 6–24)
Bilirubin Total: 0.5 mg/dL (ref 0.0–1.2)
CO2: 26 mmol/L (ref 20–29)
Calcium: 9.9 mg/dL (ref 8.7–10.2)
Chloride: 105 mmol/L (ref 96–106)
Creatinine, Ser: 1.16 mg/dL (ref 0.76–1.27)
GFR calc Af Amer: 91 mL/min/{1.73_m2} (ref >59)
GFR calc non Af Amer: 78 mL/min/{1.73_m2} (ref >59)
Globulin, Total: 2.6 g/dL (ref 1.5–4.5)
Glucose: 93 mg/dL (ref 65–99)
Potassium: 4.3 mmol/L (ref 3.5–5.2)
Sodium: 143 mmol/L (ref 134–144)
Total Protein: 7.1 g/dL (ref 6.0–8.5)

## 2019-02-26 LAB — CBC WITH DIFFERENTIAL/PLATELET
Basophils Absolute: 0.1 10*3/uL (ref 0.0–0.2)
Basos: 1 %
EOS (ABSOLUTE): 0.3 10*3/uL (ref 0.0–0.4)
Eos: 5 %
Hematocrit: 44.9 % (ref 37.5–51.0)
Hemoglobin: 15.3 g/dL (ref 13.0–17.7)
Immature Grans (Abs): 0 10*3/uL (ref 0.0–0.1)
Immature Granulocytes: 0 %
Lymphocytes Absolute: 1.6 10*3/uL (ref 0.7–3.1)
Lymphs: 31 %
MCH: 30.4 pg (ref 26.6–33.0)
MCHC: 34.1 g/dL (ref 31.5–35.7)
MCV: 89 fL (ref 79–97)
Monocytes Absolute: 0.6 10*3/uL (ref 0.1–0.9)
Monocytes: 11 %
Neutrophils Absolute: 2.6 10*3/uL (ref 1.4–7.0)
Neutrophils: 52 %
Platelets: 236 10*3/uL (ref 150–450)
RBC: 5.03 x10E6/uL (ref 4.14–5.80)
RDW: 13.9 % (ref 11.6–15.4)
WBC: 5.1 10*3/uL (ref 3.4–10.8)

## 2019-02-26 LAB — LIPID PANEL
Chol/HDL Ratio: 5 ratio (ref 0.0–5.0)
Cholesterol, Total: 176 mg/dL (ref 100–199)
HDL: 35 mg/dL — ABNORMAL LOW (ref >39)
LDL Chol Calc (NIH): 107 mg/dL — ABNORMAL HIGH (ref 0–99)
Triglycerides: 193 mg/dL — ABNORMAL HIGH (ref 0–149)
VLDL Cholesterol Cal: 34 mg/dL (ref 5–40)

## 2019-02-26 LAB — TSH: TSH: 1.38 u[IU]/mL (ref 0.450–4.500)

## 2019-02-26 LAB — FOLATE: Folate: 8 ng/mL (ref >3.0)

## 2019-02-26 LAB — PSA: Prostate Specific Ag, Serum: 1.2 ng/mL (ref 0.0–4.0)

## 2019-04-24 ENCOUNTER — Other Ambulatory Visit: Payer: BC Managed Care – PPO

## 2019-04-24 ENCOUNTER — Other Ambulatory Visit: Payer: Self-pay

## 2019-04-24 DIAGNOSIS — B20 Human immunodeficiency virus [HIV] disease: Secondary | ICD-10-CM

## 2019-04-25 LAB — T-HELPER CELL (CD4) - (RCID CLINIC ONLY)
CD4 % Helper T Cell: 9 % — ABNORMAL LOW (ref 33–65)
CD4 T Cell Abs: 206 /uL — ABNORMAL LOW (ref 400–1790)

## 2019-04-30 LAB — HIV-1 RNA QUANT-NO REFLEX-BLD
HIV 1 RNA Quant: 3220 copies/mL — ABNORMAL HIGH
HIV-1 RNA Quant, Log: 3.51 Log copies/mL — ABNORMAL HIGH

## 2019-05-16 ENCOUNTER — Encounter: Payer: Self-pay | Admitting: Infectious Diseases

## 2019-05-16 NOTE — Progress Notes (Signed)
Name: Blayke Cordrey  DOB: [birth year] MRN: [id number] PCP: Carlena Hurl, PA-C    Virtual Visit via [place]  I connected with [name] on 05/26/19 at  9:45 AM EST by MyChart and verified that I am speaking with the correct person using two identifiers.   I discussed the limitations, risks, security and privacy concerns of performing an evaluation and management service by telephone and the availability of in person appointments. I also discussed with the patient that there may be a patient responsible charge related to this service. The patient expressed understanding and agreed to proceed.  Patient Location: home in [place] Provider Location: [facility]   Brief Narrative:  Ashaz is a 39 y.o. 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 CD4 nadir 10, VL 1.23 million HIV Risk: MSM History of OIs: Cryptococcal meningitis, oral thrush, PJP Intake Labs June 09, 2018: Hep B sAg (-), sAb (-), cAb (not done); Hep A (not done), Hep C (-) Quantiferon (not done) HLA B*5701 (-) G6PD:   Previous Regimens: . Biktarvy   Genotypes: . 12-2018: M184V  Patient Active Problem List   Diagnosis Date Noted  . HIV (human immunodeficiency virus infection) ([place]) 09/05/2018  . AIDS ([place])   . History of cryptococcal meningitis   . Screening for prostate cancer 02/25/2019  . Need for hepatitis A and B vaccination 02/25/2019  . Vaccine counseling 02/25/2019  . Thrombocytopenia ([place]) 02/25/2019  . Elevated liver enzymes 02/25/2019  . Influenza vaccination declined 02/25/2019  . Itchy skin 02/21/2019  . Alcohol use 02/21/2019  . Depression 12/10/2018  . Poor dentition 10/03/2018  . Long term (current) use of antibiotics 09/05/2018  . Protein-calorie malnutrition, severe 06/10/2018    Subjective:  CC: FU HIV/AIDS.  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    HPI: Brodie has been doing well and feeling better  since last office visit. He has no headaches any longer and feels like he put on some weight. Currently living with his sister and her children. Working full time evening/night hours at [employer]  Largest concern today is daily alcohol (liquor) consumption with excess on weekends. He does smoke occasional cigarettes but often wonders why he lights up.   Recently saw PCP in November 2020 - given last doses of Hep A and Hep B (Heplisav dosed 5 months apart).   Recently his CD4 is up to 200 cells, VL still 3200 copies. He does his best to get doses of Biktarvy in at the same time  Review of Systems  Past Medical History:  Diagnosis Date  . AIDS (acquired immune deficiency syndrome) ([place]) 06/2018  . Alcohol abuse   . 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 GERD (gastroesophageal reflux disease)   . History of cryptococcal meningitis   . Protein calorie malnutrition ([place])   . Thrombocytopenia ([employer]) 06/2018    Outpatient Medications Prior to Visit  Medication Sig Dispense Refill  . fluconazole (DIFLUCAN) 200 MG tablet Take 1 tablet (200 mg total) by mouth daily. 30 tablet 11  . sulfamethoxazole-trimethoprim (BACTRIM DS) 800-160 MG tablet Take 1 tablet by mouth daily. 30 tablet 5  . acetaminophen (TYLENOL) 325 MG tablet Take 2 tablets (650 mg total) by mouth every 6 (six) hours as needed for mild pain (or Fever >/= 101). (Patient not taking: Reported on 01/01/2019)    . bictegravir-emtricitabine-tenofovir AF (BIKTARVY) 50-200-25 MG TABS tablet Take 1 tablet by mouth daily. (Patient not taking: Reported on 05/19/2019)  30 tablet 3  . feeding supplement, ENSURE ENLIVE, (ENSURE ENLIVE) LIQD Take 237 mLs by mouth 2 (two) times daily between meals. (Patient not taking: Reported on 12/10/2018) 237 mL 12  . mupirocin cream (BACTROBAN) 2 % Apply topically 4 (four) times daily. To left ear (Patient not taking: Reported on 10/03/2018) 15 g 0  . ondansetron (ZOFRAN ODT) 4 MG disintegrating tablet Take 1  tablet (4 mg total) by mouth every 8 (eight) hours as needed for nausea or vomiting. (Patient not taking: Reported on 10/03/2018) 20 tablet 0  . traZODone (DESYREL) 50 MG tablet TAKE 0.5-1 TABLETS (25-50 MG TOTAL) BY MOUTH AT BEDTIME AS NEEDED FOR SLEEP. (Patient not taking: Reported on 02/25/2019) 30 tablet 1  . triamcinolone ointment (KENALOG) 0.5 % Apply 1 application topically 2 (two) times daily. (Patient not taking: Reported on 02/25/2019) 30 g 1  . nystatin (MYCOSTATIN) 100000 UNIT/ML suspension Take 5 mLs (500,000 Units total) by mouth 4 (four) times daily. Swish and spit this medication (Patient not taking: Reported on 02/25/2019) 60 mL 0   No facility-administered medications prior to visit.     No Known Allergies  Social History   Tobacco Use  . Smoking status: Current Every Day Smoker    Packs/day: 1.00    Types: Cigarettes  . Smokeless tobacco: Never Used  Substance Use Topics  . Alcohol use: Yes    Comment: 1/2 pint on weekend ; liquor  . Drug use: Yes    Frequency: 7.0 times per week    Types: Marijuana    Comment: daily    Family History  Problem Relation Age of Onset  . Diabetes Mother   . Hypertension Mother   . Diabetes Father   . Hypertension Father   . Healthy Sister   . Healthy Brother   . Healthy Sister   . Healthy Sister   . Healthy Sister   . Healthy Brother   . Healthy Brother   . Heart disease Neg Hx   . Cancer Neg Hx   . Stroke Neg Hx     Social History   Substance and Sexual Activity  Sexual Activity Not Currently  . Partners: Female  . Birth control/protection: Condom   Comment: declined condoms 02/2019     Objective:   Vitals:   05/19/19 0911  Weight: 160 lb (72.6 kg)  Height: 6' (1.829 m)   Body mass index is 21.7 kg/m.  Physical Exam Constitutional:      Appearance: Normal appearance. He is not ill-appearing.  HENT:     Head: Normocephalic.     Mouth/Throat:     Mouth: Mucous membranes are moist.     Pharynx:  Oropharynx is clear.  Eyes:     General: No scleral icterus. Pulmonary:     Effort: Pulmonary effort is normal.  Musculoskeletal:        General: Normal range of motion.     Cervical back: Normal range of motion.  Skin:    Coloration: Skin is not jaundiced or pale.  Neurological:     Mental Status: He is alert and oriented to person, place, and time.  Psychiatric:        Mood and Affect: Mood normal.        Judgment: Judgment normal.     Lab Results Lab Results  Component Value Date   WBC 5.1 02/25/2019   HGB 15.3 02/25/2019   HCT 44.9 02/25/2019   MCV 89 02/25/2019   PLT 236 02/25/2019  Lab Results  Component Value Date   CREATININE 1.16 02/25/2019   BUN 8 02/25/2019   NA 143 02/25/2019   K 4.3 02/25/2019   CL 105 02/25/2019   CO2 26 02/25/2019    Lab Results  Component Value Date   ALT 10 02/25/2019   AST 13 02/25/2019   ALKPHOS 91 02/25/2019   BILITOT 0.5 02/25/2019    Lab Results  Component Value Date   CHOL 176 02/25/2019   HDL 35 (L) 02/25/2019   LDLCALC 107 (H) 02/25/2019   TRIG 193 (H) 02/25/2019   CHOLHDL 5.0 02/25/2019   HIV 1 RNA Quant (copies/mL)  Date Value  04/24/2019 3,220 (H)  02/06/2019 12,800 (H)  12/10/2018 275,000 (H)   CD4 T Cell Abs (/uL)  Date Value  04/24/2019 206 (L)  02/06/2019 150 (L)  12/10/2018 103 (L)     Assessment & Plan:   Problem List Items Addressed This Visit      High   HIV (human immunodeficiency virus infection) ([place]) - Primary (Chronic)    While his immune system is responding, he still has ongoing viremia > 1000 copies due to missed doses of Biktarvy. His biggest barrier is alcohol use as he reports it causes missed doses.  He knows he can do better and will spend more time and attention to getting his medication in as he does report he is feeling much better compared to a year ago.   Follow up scheduled in 3 months with labs prior to Video Visit.       Relevant Orders   Hepatitis B surface  antibody,quantitative   HIV-1 RNA quant-no reflex-bld   T-helper cell (CD4)- ([facility] clinic only)   History of cryptococcal meningitis    He has completed 1 year of recommended secondary prophylaxis and CD4 has reconstituted to 200; however given that he has been continually viremic will have him continue Fluconazole for another 3 months then check on adherence.       AIDS ([place])    Continue bactrim prophylaxis. His CD4 has shown signs of recovery but still with viremia. Will reassess in another 3 months.         Unprioritized   Elevated liver enzymes   Alcohol use    He is contemplating cessation. "doesn't know why he continues to drink" but ready to work on quitting. He has good support with his sister and offered counseling services with our team here. He will consider.          Janene Madeira, MSN, NP-C [facility] [place] Pager: [phone] Office: [phone]  05/26/19  12:18 PM

## 2019-05-16 NOTE — Assessment & Plan Note (Signed)
He has completed 1 year of recommended secondary prophylaxis and CD4 has reconstituted to 200; however given that he has been continually viremic will have him continue Fluconazole for another 3 months then check on adherence.

## 2019-05-19 ENCOUNTER — Telehealth (INDEPENDENT_AMBULATORY_CARE_PROVIDER_SITE_OTHER): Payer: BC Managed Care – PPO | Admitting: Infectious Diseases

## 2019-05-19 ENCOUNTER — Encounter: Payer: Self-pay | Admitting: Infectious Diseases

## 2019-05-19 ENCOUNTER — Telehealth: Payer: Self-pay | Admitting: Pharmacy Technician

## 2019-05-19 ENCOUNTER — Other Ambulatory Visit: Payer: Self-pay

## 2019-05-19 VITALS — Ht 72.0 in | Wt 160.0 lb

## 2019-05-19 DIAGNOSIS — Z8661 Personal history of infections of the central nervous system: Secondary | ICD-10-CM | POA: Diagnosis not present

## 2019-05-19 DIAGNOSIS — Z8619 Personal history of other infectious and parasitic diseases: Secondary | ICD-10-CM

## 2019-05-19 DIAGNOSIS — Z7289 Other problems related to lifestyle: Secondary | ICD-10-CM | POA: Diagnosis not present

## 2019-05-19 DIAGNOSIS — B20 Human immunodeficiency virus [HIV] disease: Secondary | ICD-10-CM | POA: Diagnosis not present

## 2019-05-19 DIAGNOSIS — R748 Abnormal levels of other serum enzymes: Secondary | ICD-10-CM | POA: Diagnosis not present

## 2019-05-19 DIAGNOSIS — Z789 Other specified health status: Secondary | ICD-10-CM

## 2019-05-19 NOTE — Telephone Encounter (Addendum)
[facility] Patient Advocate Encounter   Was successful in obtaining a [place] copay card for [employer].  This copay card will make the patients copay $0.  I have spoken with the patient.    RxBIN: [id number] RxPCN: ACCESS RxGRP: [id number] ID: [id number]  Card would not work, stating will be granted once in a lifetime.     I was successful in securing patient a $7500 grant from [employer] ([employer]) to provide copayment coverage for Biktarvy.  This will make the out of pocket cost $0.     I have spoken with the patient.     RxBin: [id number] PCN:   [id number] Member ID: [id number] Group ID: [id number] Dates of Eligibility: 05/20/2019 through 05/19/2020  Patient knows to call the office with questions or concerns.  Netty Starring. Dimas Aguas CPhT [facility] for [facility] Disease Phone: [phone] Fax:  [phone]

## 2019-05-26 NOTE — Assessment & Plan Note (Signed)
Continue bactrim prophylaxis. His CD4 has shown signs of recovery but still with viremia. Will reassess in another 3 months.

## 2019-05-26 NOTE — Assessment & Plan Note (Signed)
He is contemplating cessation. "doesn't know why he continues to drink" but ready to work on quitting. He has good support with his sister and offered counseling services with our team here. He will consider.

## 2019-05-26 NOTE — Assessment & Plan Note (Signed)
While his immune system is responding, he still has ongoing viremia > 1000 copies due to missed doses of Biktarvy. His biggest barrier is alcohol use as he reports it causes missed doses.  He knows he can do better and will spend more time and attention to getting his medication in as he does report he is feeling much better compared to a year ago.   Follow up scheduled in 3 months with labs prior to Video Visit.

## 2019-06-14 ENCOUNTER — Other Ambulatory Visit: Payer: Self-pay | Admitting: Infectious Diseases

## 2019-06-14 DIAGNOSIS — B20 Human immunodeficiency virus [HIV] disease: Secondary | ICD-10-CM

## 2019-08-11 ENCOUNTER — Telehealth: Payer: Self-pay

## 2019-08-11 NOTE — Telephone Encounter (Signed)
COVID-19 Pre-Screening Questions:08/11/19  Do you currently have a fever (>100 F), chills or unexplained body aches?NO  . Are you currently experiencing new cough, shortness of breath, sore throat, runny nose? NO .  Have you recently travelled outside the state of [place] in the last 14 days?NO .  Have you been in contact with someone that is currently pending confirmation of Covid19 testing or has been confirmed to have the Covid19 virus? NO  **If the patient answers NO to ALL questions -  advise the patient to please call the clinic before coming to the office should any symptoms develop.     

## 2019-08-12 ENCOUNTER — Other Ambulatory Visit: Payer: Self-pay

## 2019-08-12 ENCOUNTER — Other Ambulatory Visit: Payer: BC Managed Care – PPO

## 2019-08-12 DIAGNOSIS — B20 Human immunodeficiency virus [HIV] disease: Secondary | ICD-10-CM

## 2019-08-13 ENCOUNTER — Other Ambulatory Visit: Payer: Self-pay | Admitting: Infectious Diseases

## 2019-08-13 DIAGNOSIS — L299 Pruritus, unspecified: Secondary | ICD-10-CM

## 2019-08-13 LAB — T-HELPER CELL (CD4) - (RCID CLINIC ONLY)
CD4 % Helper T Cell: 11 % — ABNORMAL LOW (ref 33–65)
CD4 T Cell Abs: 270 /uL — ABNORMAL LOW (ref 400–1790)

## 2019-08-15 LAB — HIV-1 RNA QUANT-NO REFLEX-BLD
HIV 1 RNA Quant: 361 copies/mL — ABNORMAL HIGH
HIV-1 RNA Quant, Log: 2.56 Log copies/mL — ABNORMAL HIGH

## 2019-08-15 LAB — HEPATITIS B SURFACE ANTIBODY, QUANTITATIVE: Hep B S AB Quant (Post): <5 m[IU]/mL — ABNORMAL LOW (ref >=10)

## 2019-08-19 ENCOUNTER — Other Ambulatory Visit: Payer: Self-pay

## 2019-08-19 ENCOUNTER — Encounter: Payer: Self-pay | Admitting: Infectious Diseases

## 2019-08-19 ENCOUNTER — Telehealth (INDEPENDENT_AMBULATORY_CARE_PROVIDER_SITE_OTHER): Payer: BC Managed Care – PPO | Admitting: Infectious Diseases

## 2019-08-19 DIAGNOSIS — Z8661 Personal history of infections of the central nervous system: Secondary | ICD-10-CM | POA: Diagnosis not present

## 2019-08-19 DIAGNOSIS — Z8619 Personal history of other infectious and parasitic diseases: Secondary | ICD-10-CM

## 2019-08-19 DIAGNOSIS — B20 Human immunodeficiency virus [HIV] disease: Secondary | ICD-10-CM

## 2019-08-19 DIAGNOSIS — Z7289 Other problems related to lifestyle: Secondary | ICD-10-CM | POA: Diagnosis not present

## 2019-08-19 DIAGNOSIS — Z21 Asymptomatic human immunodeficiency virus [HIV] infection status: Secondary | ICD-10-CM | POA: Diagnosis not present

## 2019-08-19 DIAGNOSIS — F1090 Alcohol use, unspecified, uncomplicated: Secondary | ICD-10-CM

## 2019-08-19 DIAGNOSIS — Z789 Other specified health status: Secondary | ICD-10-CM

## 2019-08-19 MED ORDER — BIKTARVY 50-200-25 MG PO TABS: 1.0000 | ORAL_TABLET | Freq: Every day | ORAL | 11 refills | Status: DC

## 2019-08-19 NOTE — Assessment & Plan Note (Addendum)
No concerning findings of recurrence. Will have him continue the fluconazole another 3 months to ensure his viral load remains low. CD4 up to 270 and > 12 months on consolidative therapy/secondary prophylaxisis.

## 2019-08-19 NOTE — Assessment & Plan Note (Signed)
Reducing use but not quite ready to quit.

## 2019-08-19 NOTE — Progress Notes (Signed)
Name: Christopher Henson  DOB: [birth year] MRN: [id number] PCP: Carlena Hurl, PA-C    Virtual Visit via Telephone:  I connected with Christopher Henson on 08/19/19 at  9:45 AM EDT by Telephone and verified that I am speaking with the correct person using two identifiers.   I discussed the limitations, risks, security and privacy concerns of performing an evaluation and management service by telephone and the availability of in person appointments. I also discussed with the patient that there may be a patient responsible charge related to this service. The patient expressed understanding and agreed to proceed.  Patient Location: home in [place] Additional Participants: Retia Passe, [place]  Provider Location: [facility]   Brief Narrative:  Christopher Henson is a 39 y.o. 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 CD4 nadir 10, VL 1.23 million HIV Risk: MSM History of OIs: Cryptococcal meningitis, oral thrush, PJP Intake Labs June 09, 2018: Hep B sAg (-), sAb (-), cAb (not done); Hep A (not done), Hep C (-) Quantiferon (not done) HLA B*5701 (-) G6PD:   Previous Regimens: . Biktarvy   Genotypes: . 12-2018: M184V  Patient Active Problem List   Diagnosis Date Noted  . HIV (human immunodeficiency virus infection) ([place]) 09/05/2018  . History of cryptococcal meningitis   . Need for hepatitis A and B vaccination 02/25/2019  . Vaccine counseling 02/25/2019  . Thrombocytopenia ([place]) 02/25/2019  . Elevated liver enzymes 02/25/2019  . Influenza vaccination declined 02/25/2019  . Alcohol use 02/21/2019  . Depression 12/10/2018  . Poor dentition 10/03/2018  . Protein-calorie malnutrition, severe 06/10/2018    Subjective:  CC: FU HIV/AIDS.  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    HPI: Christopher Henson has been doing well since our last OV together. He has no concerns today aside from he needs refills for Bactrim. He is taking his Biktarvy and  Fluconazole once a day and feels like he only misses "a dose or two every once in a while."   He has cut down on the alcohol use but has not completely stopped. Smoking is about the same.   Reports no complaints today suggestive of associated opportunistic infection or advancing HIV disease such as fevers, night sweats, weight loss, anorexia, cough, SOB, nausea, vomiting, diarrhea, headache, sensory changes, lymphadenopathy or oral thrush.    Review of Systems  All other systems reviewed and are negative.   Past Medical History:  Diagnosis Date  . AIDS (acquired immune deficiency syndrome) ([place]) 06/2018  . Alcohol abuse   . 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 GERD (gastroesophageal reflux disease)   . History of cryptococcal meningitis   . Protein calorie malnutrition ([place])   . Thrombocytopenia ([place]) 06/2018    Outpatient Medications Prior to Visit  Medication Sig Dispense Refill  . BIKTARVY 50-200-25 MG TABS tablet TAKE 1 TABLET BY MOUTH EVERY DAY 30 tablet 3  . sulfamethoxazole-trimethoprim (BACTRIM DS) 800-160 MG tablet Take 1 tablet by mouth daily. 30 tablet 5  . acetaminophen (TYLENOL) 325 MG tablet Take 2 tablets (650 mg total) by mouth every 6 (six) hours as needed for mild pain (or Fever >/= 101). (Patient not taking: Reported on 01/01/2019)    . fluconazole (DIFLUCAN) 200 MG tablet Take 1 tablet (200 mg total) by mouth daily. 30 tablet 11  . mupirocin cream (BACTROBAN) 2 % Apply topically 4 (four) times daily. To left ear (Patient not taking: Reported on 10/03/2018) 15 g 0  . ondansetron (ZOFRAN ODT)  4 MG disintegrating tablet Take 1 tablet (4 mg total) by mouth every 8 (eight) hours as needed for nausea or vomiting. (Patient not taking: Reported on 10/03/2018) 20 tablet 0  . traZODone (DESYREL) 50 MG tablet TAKE 0.5-1 TABLETS (25-50 MG TOTAL) BY MOUTH AT BEDTIME AS NEEDED FOR SLEEP. (Patient not taking: Reported on 02/25/2019) 30 tablet 1  . triamcinolone ointment (KENALOG)  0.5 % APPLY TO AFFECTED AREA TWICE A DAY 30 g 1  . feeding supplement, ENSURE ENLIVE, (ENSURE ENLIVE) LIQD Take 237 mLs by mouth 2 (two) times daily between meals. (Patient not taking: Reported on 12/10/2018) 237 mL 12   No facility-administered medications prior to visit.     No Known Allergies  Social History   Tobacco Use  . Smoking status: Current Every Day Smoker    Packs/day: 1.00    Types: Cigarettes  . Smokeless tobacco: Never Used  Substance Use Topics  . Alcohol use: Yes    Comment: 1/2 pint on weekend ; liquor  . Drug use: Yes    Frequency: 7.0 times per week    Types: Marijuana    Comment: daily    Family History  Problem Relation Age of Onset  . Diabetes Mother   . Hypertension Mother   . Diabetes Father   . Hypertension Father   . Healthy Sister   . Healthy Brother   . Healthy Sister   . Healthy Sister   . Healthy Sister   . Healthy Brother   . Healthy Brother   . Heart disease Neg Hx   . Cancer Neg Hx   . Stroke Neg Hx     Social History   Substance and Sexual Activity  Sexual Activity Not Currently  . Partners: Female  . Birth control/protection: Condom   Comment: declined condoms 02/2019     Objective:   Vitals:   08/19/19 0936  Weight: 160 lb (72.6 kg)   Body mass index is 21.7 kg/m.  Physical Exam Pulmonary:     Effort: Pulmonary effort is normal.     Comments: No shortness of breath detected in conversation.  Neurological:     Mental Status: He is oriented to person, place, and time.  Psychiatric:        Mood and Affect: Mood normal.        Behavior: Behavior normal.        Thought Content: Thought content normal.        Judgment: Judgment normal.     Lab Results Lab Results  Component Value Date   WBC 5.1 02/25/2019   HGB 15.3 02/25/2019   HCT 44.9 02/25/2019   MCV 89 02/25/2019   PLT 236 02/25/2019    Lab Results  Component Value Date   CREATININE 1.16 02/25/2019   BUN 8 02/25/2019   NA 143 02/25/2019   K 4.3  02/25/2019   CL 105 02/25/2019   CO2 26 02/25/2019    Lab Results  Component Value Date   ALT 10 02/25/2019   AST 13 02/25/2019   ALKPHOS 91 02/25/2019   BILITOT 0.5 02/25/2019    Lab Results  Component Value Date   CHOL 176 02/25/2019   HDL 35 (L) 02/25/2019   LDLCALC 107 (H) 02/25/2019   TRIG 193 (H) 02/25/2019   CHOLHDL 5.0 02/25/2019   HIV 1 RNA Quant (copies/mL)  Date Value  08/12/2019 361 (H)  04/24/2019 3,220 (H)  02/06/2019 12,800 (H)   CD4 T Cell Abs (/uL)  Date Value  08/12/2019 270 (L)  04/24/2019 206 (L)  02/06/2019 150 (L)     Assessment & Plan:   Problem List Items Addressed This Visit      High   HIV (human immunodeficiency virus infection) ([place]) (Chronic)    Discussed labs with Giancarlos over the phone today. He has had imperfect viral suppression in the setting of adherence troubles. He sounds to be improving, however and his VL reflects that. I congratulated him on the hard work and encouraged him to continue.  Will repeat viral load and consider adding genotype next lab draw to ensure no emerging resistance is contributing.  He can stop bactrim as his CD4 > 200 for > 3 months now.  RTC in 4 months in person with labs prior to visit.       Relevant Medications   bictegravir-emtricitabine-tenofovir AF (BIKTARVY) 50-200-25 MG TABS tablet   History of cryptococcal meningitis    No concerning findings of recurrence. Will have him continue the fluconazole another 3 months to ensure his viral load remains low. CD4 up to 270 and > 12 months on consolidative therapy/secondary prophylaxisis.       RESOLVED: AIDS ([place])   Relevant Medications   bictegravir-emtricitabine-tenofovir AF (BIKTARVY) 50-200-25 MG TABS tablet     Unprioritized   Alcohol use    Reducing use but not quite ready to quit.         Follow Up Instructions: Continue Biktarvy Continue Fluconazole  Stop Bactrim RTC in 38mwith repeated labs    I discussed the assessment and  treatment plan with the patient. The patient was provided an opportunity to ask questions and all were answered. The patient agreed with the plan and demonstrated an understanding of the instructions.   The patient was advised to call back or seek an in-person evaluation if the symptoms worsen or if the condition fails to improve as anticipated.  I provided 12 minutes of non-face-to-face time during this encounter.   SJanene Madeira MSN, NP-C [email or web address] Pager: [phone]: [phone]  08/19/19  11:19 AM

## 2019-08-19 NOTE — Assessment & Plan Note (Addendum)
Discussed labs with Christopher Henson over the phone today. He has had imperfect viral suppression in the setting of adherence troubles. He sounds to be improving, however and his VL reflects that. I congratulated him on the hard work and encouraged him to continue.  Will repeat viral load and consider adding genotype next lab draw to ensure no emerging resistance is contributing.  He can stop bactrim as his CD4 > 200 for > 3 months now.  RTC in 4 months in person with labs prior to visit.

## 2019-09-11 IMAGING — DX DG CHEST 1V PORT
1 series · 1 of 1 positions shown · non-contrast
Comparison: None.

CLINICAL DATA: Shortness of breath and fever.

EXAM:
PORTABLE CHEST 1 VIEW

[chest ap]
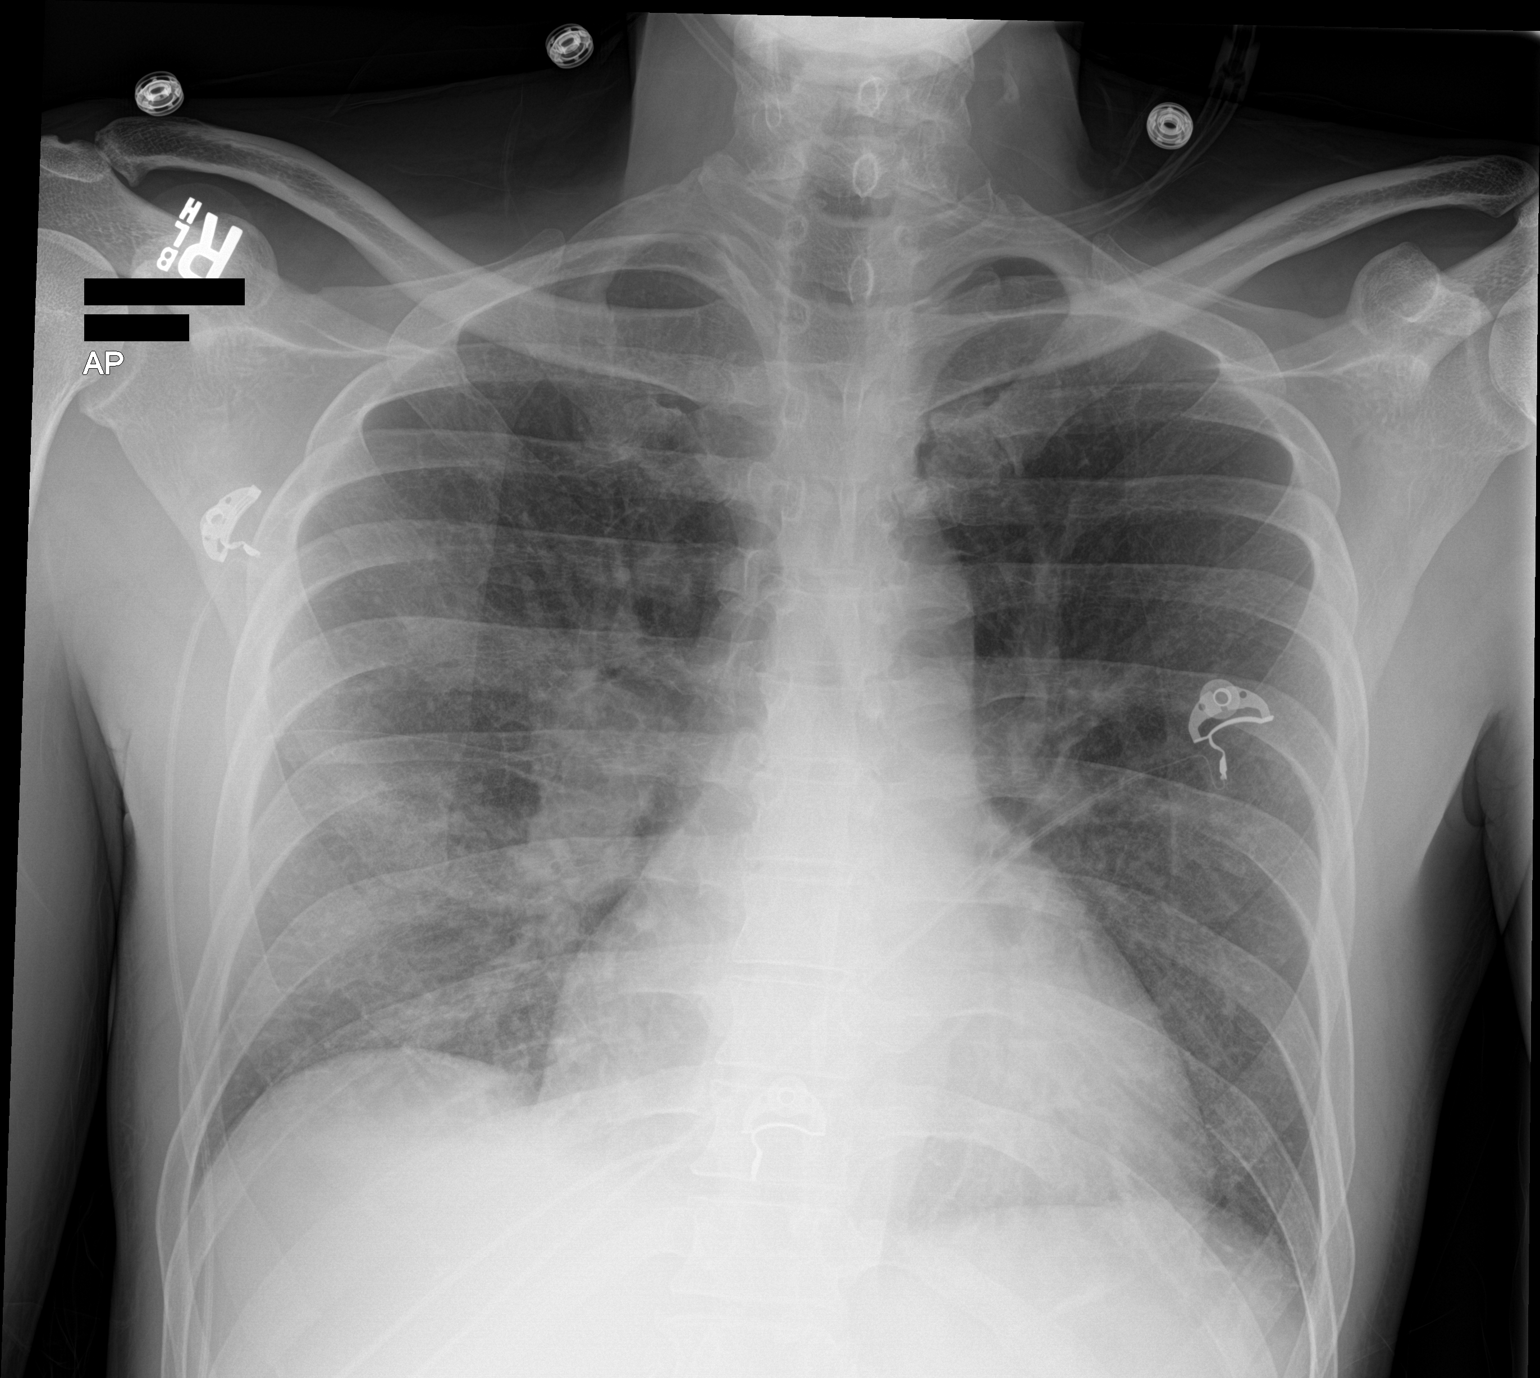

[1 of 1 positions shown; findings below may reference images not displayed]

FINDINGS: The cardiac silhouette is prominent but poorly evaluated due to the
portable technique. The left hilum is normal. The right hilum is
somewhat prominent compared to the left. The mediastinum is
otherwise unremarkable. No pneumothorax. No nodules or masses. No
overt edema. No focal infiltrate.
IMPRESSION: 1. The right hilum is somewhat prominent compared to the left.
Recommend a PA and lateral chest x-ray for better evaluation. If the
finding persists, consider CT imaging.
2. No other significant abnormalities.

## 2019-10-14 ENCOUNTER — Telehealth: Payer: Self-pay

## 2019-10-14 NOTE — Telephone Encounter (Signed)
PA initiated for [facility]. Awaiting response.   Burris Matherne Loyola Mast, RN

## 2019-10-15 NOTE — Telephone Encounter (Signed)
PA approved. Pharmacy notified.   Stellar Gensel Loyola Mast, RN

## 2019-11-28 ENCOUNTER — Encounter: Payer: Self-pay | Admitting: Medical

## 2019-11-28 ENCOUNTER — Other Ambulatory Visit: Payer: Self-pay

## 2019-11-28 ENCOUNTER — Ambulatory Visit: Payer: BC Managed Care – PPO | Admitting: Medical

## 2019-11-28 VITALS — BP 144/96 | HR 76 | Temp 98.8°F | Ht 72.0 in | Wt 164.8 lb

## 2019-11-28 DIAGNOSIS — Z21 Asymptomatic human immunodeficiency virus [HIV] infection status: Secondary | ICD-10-CM

## 2019-11-28 DIAGNOSIS — R220 Localized swelling, mass and lump, head: Secondary | ICD-10-CM

## 2019-11-28 DIAGNOSIS — K047 Periapical abscess without sinus: Secondary | ICD-10-CM | POA: Insufficient documentation

## 2019-11-28 DIAGNOSIS — R519 Headache, unspecified: Secondary | ICD-10-CM

## 2019-11-28 DIAGNOSIS — M272 Inflammatory conditions of jaws: Principal | ICD-10-CM | POA: Diagnosis present

## 2019-11-28 MED ORDER — HYDROCODONE-ACETAMINOPHEN 5-325 MG PO TABS
1.0000 | ORAL_TABLET | Freq: Four times a day (QID) | ORAL | 0 refills | Status: DC | PRN
Start: 2019-11-28 — End: 2021-03-14

## 2019-11-28 MED ORDER — AMOXICILLIN 500 MG PO TABS: ORAL_TABLET | ORAL | 0 refills | Status: DC

## 2019-11-28 NOTE — Progress Notes (Signed)
Subjective:  Christopher Henson is a 39 y.o. male who presents for Chief Complaint  Patient presents with  . Oral Swelling    want something for pain-right side in the back      Here for dental abscess.  He started having pain in his right upper teeth within the past week and a half and now has swelling and worse pain.  He does not have a dentist.  He is in pain and a friend advised to come in and get antibiotic.  He is trying to get an appointment with the dentist.  No other aggravating or relieving factors.    No other c/o.  The following portions of the patient's history were reviewed and updated as appropriate: allergies, current medications, past family history, past medical history, past social history, past surgical history and problem list.  ROS Otherwise as in subjective above  Objective: BP (!) 144/96   Pulse 76   Ht 6' (1.829 m)   Wt 164 lb 12.8 oz (74.8 kg)   SpO2 96%   BMI 22.35 kg/m   General appearance: alert, no distress, well developed, well nourished Localized swelling over the right face and cheek tender in the same area without erythema fluctuance or induration Oral MMM, moderate plaque throughout, obvious decay right upper molars and swelling suggestive of abscess   Assessment: Encounter Diagnoses  Name Primary?  . Facial pain Yes  . Facial swelling   . Dental abscess   . Asymptomatic HIV infection (HCC)      Plan: Begin medication as below, advised he work on good oral hygiene, follow-up with dentist next week.  There are 2 dentist on our same [address].  I recommend he get an appointment right away  Christopher Henson was seen today for oral swelling.  Diagnoses and all orders for this visit:  Facial pain  Facial swelling  Dental abscess  Asymptomatic HIV infection (HCC)  Other orders -     amoxicillin (AMOXIL) 500 MG tablet; 2 tablets po BID x 10 days -     HYDROcodone-acetaminophen (NORCO) 5-325 MG tablet; Take 1 tablet by mouth every 6 (six) hours as  needed.    Follow up: soon for regular f/u on chronic issues

## 2019-12-01 ENCOUNTER — Telehealth: Payer: Self-pay | Admitting: Medical

## 2019-12-01 NOTE — Telephone Encounter (Signed)
Pt called and said his mouth is still swollen so he did not go to work today. Pt wanted to see if he could get a Drs note. Pt said he will try to return to work tomorrow. He will come by tomorrow and pick up note or it can be emailed

## 2019-12-01 NOTE — Telephone Encounter (Signed)
That is fine, can do note.   Encourage him to get in with dentist this week ASAP.  Also needs routine med check follow up here.

## 2019-12-02 ENCOUNTER — Encounter: Payer: Self-pay | Admitting: Medical

## 2019-12-02 NOTE — Telephone Encounter (Signed)
Letter printed. Left pt a VM to call the office

## 2019-12-12 ENCOUNTER — Other Ambulatory Visit: Payer: Self-pay

## 2019-12-12 ENCOUNTER — Other Ambulatory Visit: Payer: Self-pay | Admitting: Critical Care Medicine

## 2019-12-12 ENCOUNTER — Other Ambulatory Visit: Payer: BC Managed Care – PPO

## 2019-12-12 DIAGNOSIS — Z20822 Contact with and (suspected) exposure to covid-19: Secondary | ICD-10-CM

## 2019-12-13 ENCOUNTER — Telehealth: Payer: Self-pay

## 2019-12-13 LAB — NOVEL CORONAVIRUS, NAA: SARS-CoV-2, NAA: NOT DETECTED

## 2019-12-13 NOTE — Telephone Encounter (Signed)
Pt's mother wanting covid results- advised that results are not back. 

## 2019-12-14 NOTE — Telephone Encounter (Signed)
Pt  called for covid results -advised results are not back.  

## 2019-12-18 ENCOUNTER — Encounter: Payer: BC Managed Care – PPO | Admitting: Medical

## 2019-12-19 ENCOUNTER — Encounter: Payer: Self-pay | Admitting: Medical

## 2019-12-19 ENCOUNTER — Other Ambulatory Visit: Payer: Self-pay

## 2019-12-19 DIAGNOSIS — Z79899 Other long term (current) drug therapy: Secondary | ICD-10-CM

## 2019-12-19 DIAGNOSIS — B20 Human immunodeficiency virus [HIV] disease: Secondary | ICD-10-CM

## 2019-12-19 DIAGNOSIS — Z113 Encounter for screening for infections with a predominantly sexual mode of transmission: Secondary | ICD-10-CM

## 2019-12-22 ENCOUNTER — Other Ambulatory Visit: Payer: BC Managed Care – PPO

## 2019-12-22 ENCOUNTER — Other Ambulatory Visit: Payer: Self-pay

## 2019-12-22 DIAGNOSIS — B20 Human immunodeficiency virus [HIV] disease: Secondary | ICD-10-CM

## 2019-12-22 DIAGNOSIS — Z113 Encounter for screening for infections with a predominantly sexual mode of transmission: Secondary | ICD-10-CM

## 2019-12-22 DIAGNOSIS — Z79899 Other long term (current) drug therapy: Secondary | ICD-10-CM

## 2019-12-23 LAB — T-HELPER CELL (CD4) - (RCID CLINIC ONLY)
CD4 % Helper T Cell: 14 % — ABNORMAL LOW (ref 33–65)
CD4 T Cell Abs: 209 /uL — ABNORMAL LOW (ref 400–1790)

## 2019-12-24 LAB — LIPID PANEL
Cholesterol: 166 mg/dL (ref <200)
HDL: 40 mg/dL (ref >=40)
LDL Cholesterol (Calc): 97 mg/dL (calc)
Non-HDL Cholesterol (Calc): 126 mg/dL (calc) (ref <130)
Total CHOL/HDL Ratio: 4.2 (calc) (ref <5.0)
Triglycerides: 195 mg/dL — ABNORMAL HIGH (ref <150)

## 2019-12-24 LAB — COMPLETE METABOLIC PANEL WITH GFR
AG Ratio: 1.9 (calc) (ref 1.0–2.5)
ALT: 15 U/L (ref 9–46)
AST: 14 U/L (ref 10–40)
Albumin: 4.3 g/dL (ref 3.6–5.1)
Alkaline phosphatase (APISO): 86 U/L (ref 36–130)
BUN: 9 mg/dL (ref 7–25)
CO2: 28 mmol/L (ref 20–32)
Calcium: 9.4 mg/dL (ref 8.6–10.3)
Chloride: 106 mmol/L (ref 98–110)
Creat: 1.14 mg/dL (ref 0.60–1.35)
GFR, Est African American: 92 mL/min/{1.73_m2} (ref >=60)
GFR, Est Non African American: 79 mL/min/{1.73_m2} (ref >=60)
Globulin: 2.3 g/dL (calc) (ref 1.9–3.7)
Glucose, Bld: 99 mg/dL (ref 65–99)
Potassium: 4.2 mmol/L (ref 3.5–5.3)
Sodium: 142 mmol/L (ref 135–146)
Total Bilirubin: 0.8 mg/dL (ref 0.2–1.2)
Total Protein: 6.6 g/dL (ref 6.1–8.1)

## 2019-12-24 LAB — CBC WITH DIFFERENTIAL/PLATELET
Absolute Monocytes: 931 cells/uL (ref 200–950)
Basophils Absolute: 67 cells/uL (ref 0–200)
Basophils Relative: 0.7 %
Eosinophils Absolute: 589 cells/uL — ABNORMAL HIGH (ref 15–500)
Eosinophils Relative: 6.2 %
HCT: 49.6 % (ref 38.5–50.0)
Hemoglobin: 16.7 g/dL (ref 13.2–17.1)
Lymphs Abs: 1539 cells/uL (ref 850–3900)
MCH: 31.1 pg (ref 27.0–33.0)
MCHC: 33.7 g/dL (ref 32.0–36.0)
MCV: 92.4 fL (ref 80.0–100.0)
MPV: 11.4 fL (ref 7.5–12.5)
Monocytes Relative: 9.8 %
Neutro Abs: 6375 cells/uL (ref 1500–7800)
Neutrophils Relative %: 67.1 %
Platelets: 197 10*3/uL (ref 140–400)
RBC: 5.37 10*6/uL (ref 4.20–5.80)
RDW: 13.4 % (ref 11.0–15.0)
Total Lymphocyte: 16.2 %
WBC: 9.5 10*3/uL (ref 3.8–10.8)

## 2019-12-24 LAB — HIV-1 RNA QUANT-NO REFLEX-BLD
HIV 1 RNA Quant: 165 Copies/mL — ABNORMAL HIGH
HIV-1 RNA Quant, Log: 2.22 Log cps/mL — ABNORMAL HIGH

## 2019-12-24 LAB — RPR: RPR Ser Ql: NONREACTIVE

## 2020-01-02 ENCOUNTER — Telehealth: Payer: Self-pay

## 2020-01-02 NOTE — Telephone Encounter (Signed)
COVID-19 Pre-Screening Questions:01/02/20  Do you currently have a fever (>100 F), chills or unexplained body aches? NO  Are you currently experiencing new cough, shortness of breath, sore throat, runny nose? NO .  Have you been in contact with someone that is currently pending confirmation of Covid19 testing or has been confirmed to have the Covid19 virus? NO  **If the patient answers NO to ALL questions -  advise the patient to please call the clinic before coming to the office should any symptoms develop.     

## 2020-01-05 ENCOUNTER — Encounter: Payer: BC Managed Care – PPO | Admitting: Infectious Diseases

## 2020-04-08 ENCOUNTER — Other Ambulatory Visit: Payer: Self-pay | Admitting: Infectious Diseases

## 2020-04-08 DIAGNOSIS — B20 Human immunodeficiency virus [HIV] disease: Secondary | ICD-10-CM

## 2020-04-12 ENCOUNTER — Other Ambulatory Visit: Payer: Self-pay | Admitting: *Deleted

## 2020-04-12 ENCOUNTER — Telehealth: Payer: Self-pay | Admitting: *Deleted

## 2020-04-12 DIAGNOSIS — Z21 Asymptomatic human immunodeficiency virus [HIV] infection status: Secondary | ICD-10-CM

## 2020-04-12 DIAGNOSIS — B20 Human immunodeficiency virus [HIV] disease: Secondary | ICD-10-CM

## 2020-04-12 MED ORDER — BIKTARVY 50-200-25 MG PO TABS: 1.0000 | ORAL_TABLET | Freq: Every day | ORAL | 2 refills | Status: DC

## 2020-04-12 NOTE — Telephone Encounter (Signed)
Patient requested refill of biktarvy. He has been out for 1 week. RN scheduled patient for labs 1/4, follow up with [facility] 1/18. Sent 30 day supply to new pharmacy

## 2020-04-13 ENCOUNTER — Ambulatory Visit: Payer: Self-pay

## 2020-04-13 ENCOUNTER — Other Ambulatory Visit: Payer: Self-pay

## 2020-04-13 ENCOUNTER — Telehealth: Payer: Self-pay

## 2020-04-13 DIAGNOSIS — Z21 Asymptomatic human immunodeficiency virus [HIV] infection status: Secondary | ICD-10-CM

## 2020-04-13 DIAGNOSIS — B20 Human immunodeficiency virus [HIV] disease: Secondary | ICD-10-CM

## 2020-04-13 NOTE — Telephone Encounter (Addendum)
[facility] Patient Advocate Encounter  Completed and sent [employer] Advancing Access application for Biktarvy for this patient who is uninsured.    Patient is approved 04/13/20 through 04/13/2021.       Clearance Coots, CPhT Specialty Pharmacy Patient [facility] for Infectious Disease Phone: [phone] Fax:  [phone]

## 2020-04-14 LAB — T-HELPER CELL (CD4) - (RCID CLINIC ONLY)
CD4 % Helper T Cell: 17 % — ABNORMAL LOW (ref 33–65)
CD4 T Cell Abs: 288 /uL — ABNORMAL LOW (ref 400–1790)

## 2020-04-19 ENCOUNTER — Telehealth: Payer: Self-pay

## 2020-04-19 NOTE — Telephone Encounter (Signed)
Patient called requesting copy of copay card. RN printed card generated by Clearance Coots, placed in envelope at front desk for patient to pick up.   Sandie Ano, RN

## 2020-04-21 LAB — CBC WITH DIFFERENTIAL/PLATELET
Absolute Monocytes: 721 cells/uL (ref 200–950)
Basophils Absolute: 49 cells/uL (ref 0–200)
Basophils Relative: 0.7 %
Eosinophils Absolute: 154 cells/uL (ref 15–500)
Eosinophils Relative: 2.2 %
HCT: 48.6 % (ref 38.5–50.0)
Hemoglobin: 17.3 g/dL — ABNORMAL HIGH (ref 13.2–17.1)
Lymphs Abs: 1960 cells/uL (ref 850–3900)
MCH: 32.3 pg (ref 27.0–33.0)
MCHC: 35.6 g/dL (ref 32.0–36.0)
MCV: 90.7 fL (ref 80.0–100.0)
MPV: 11.1 fL (ref 7.5–12.5)
Monocytes Relative: 10.3 %
Neutro Abs: 4116 cells/uL (ref 1500–7800)
Neutrophils Relative %: 58.8 %
Platelets: 214 10*3/uL (ref 140–400)
RBC: 5.36 10*6/uL (ref 4.20–5.80)
RDW: 12.8 % (ref 11.0–15.0)
Total Lymphocyte: 28 %
WBC: 7 10*3/uL (ref 3.8–10.8)

## 2020-04-21 LAB — COMPLETE METABOLIC PANEL WITH GFR
AG Ratio: 2 (calc) (ref 1.0–2.5)
ALT: 14 U/L (ref 9–46)
AST: 12 U/L (ref 10–40)
Albumin: 4.3 g/dL (ref 3.6–5.1)
Alkaline phosphatase (APISO): 84 U/L (ref 36–130)
BUN: 9 mg/dL (ref 7–25)
CO2: 30 mmol/L (ref 20–32)
Calcium: 9.2 mg/dL (ref 8.6–10.3)
Chloride: 108 mmol/L (ref 98–110)
Creat: 0.98 mg/dL (ref 0.60–1.35)
GFR, Est African American: 111 mL/min/{1.73_m2} (ref >=60)
GFR, Est Non African American: 95 mL/min/{1.73_m2} (ref >=60)
Globulin: 2.2 g/dL (calc) (ref 1.9–3.7)
Glucose, Bld: 91 mg/dL (ref 65–99)
Potassium: 4.2 mmol/L (ref 3.5–5.3)
Sodium: 143 mmol/L (ref 135–146)
Total Bilirubin: 0.5 mg/dL (ref 0.2–1.2)
Total Protein: 6.5 g/dL (ref 6.1–8.1)

## 2020-04-21 LAB — HIV-1 RNA QUANT-NO REFLEX-BLD
HIV 1 RNA Quant: 1270 Copies/mL — ABNORMAL HIGH
HIV-1 RNA Quant, Log: 3.1 Log cps/mL — ABNORMAL HIGH

## 2020-04-27 ENCOUNTER — Telehealth (INDEPENDENT_AMBULATORY_CARE_PROVIDER_SITE_OTHER): Payer: Self-pay | Admitting: Infectious Diseases

## 2020-04-27 ENCOUNTER — Encounter: Payer: Self-pay | Admitting: Infectious Diseases

## 2020-04-27 ENCOUNTER — Other Ambulatory Visit: Payer: Self-pay

## 2020-04-27 DIAGNOSIS — Z5329 Procedure and treatment not carried out because of patient's decision for other reasons: Secondary | ICD-10-CM

## 2020-04-27 NOTE — Progress Notes (Signed)
Patient called x 2 without answer.   Will need to reschedule visit to in person.    Rexene Alberts, MSN, NP-C [facility] for Infectious Disease [facility]  [email or web address] Pager: [phone] Office: [phone] [facility] Main Line: [phone]

## 2020-05-31 ENCOUNTER — Encounter: Payer: Self-pay | Admitting: Infectious Diseases

## 2020-06-14 ENCOUNTER — Other Ambulatory Visit: Payer: Self-pay

## 2020-06-14 DIAGNOSIS — B20 Human immunodeficiency virus [HIV] disease: Secondary | ICD-10-CM

## 2020-06-14 MED ORDER — BIKTARVY 50-200-25 MG PO TABS: 1.0000 | ORAL_TABLET | Freq: Every day | ORAL | 0 refills | Status: DC

## 2020-06-18 ENCOUNTER — Telehealth (INDEPENDENT_AMBULATORY_CARE_PROVIDER_SITE_OTHER): Payer: Self-pay | Admitting: Infectious Diseases

## 2020-06-18 DIAGNOSIS — B20 Human immunodeficiency virus [HIV] disease: Secondary | ICD-10-CM

## 2020-06-18 NOTE — Progress Notes (Signed)
Name: Christopher Henson  DOB: 11-20-1980 MRN: [id number] PCP: Carlena Hurl, PA-C    Virtual Visit:  I connected with Christopher Henson on 08/09/20 at 10:45 AM EST by Telephone and verified that I am speaking with the correct person using two identifiers.   I discussed the limitations, risks, security and privacy concerns of performing an evaluation and management service by telephone and the availability of in person appointments. I also discussed with the patient that there may be a patient responsible charge related to this service. The patient expressed understanding and agreed to proceed.  Patient Location: home in [place] Additional Participants: none Provider Location: [facility]   Brief Narrative:  Christopher Henson is a 40 y.o. 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 CD4 nadir 10, VL 1.23 million HIV Risk: MSM History of OIs: Cryptococcal meningitis, oral thrush, PJP Intake Labs June 09, 2018: Hep B sAg (-), sAb (-), cAb (not done); Hep A (not done), Hep C (-) Quantiferon (not done) HLA B*5701 (-) G6PD:   Previous Regimens: . Biktarvy   Genotypes: . 12-2018: M184V    Subjective:  CC: FU HIV/AIDS.  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    HPI: No changes to his health. States that he is missing doses frequently but cannot quantify how many. Does not want to discuss any component of depression or other contributing factors to imperfect adherence.   Review of Systems    Past Medical History:  Diagnosis Date  . AIDS (acquired immune deficiency syndrome) ([place]) 06/2018  . Alcohol abuse   . 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 GERD (gastroesophageal reflux disease)   . History of cryptococcal meningitis   . Protein calorie malnutrition ([place])   . Thrombocytopenia ([place]) 06/2018    Outpatient Medications Prior to Visit  Medication Sig Dispense Refill  . bictegravir-emtricitabine-tenofovir AF  (BIKTARVY) 50-200-25 MG TABS tablet Take 1 tablet by mouth daily. 30 tablet 0  . acetaminophen (TYLENOL) 325 MG tablet Take 2 tablets (650 mg total) by mouth every 6 (six) hours as needed for mild pain (or Fever >/= 101). (Patient not taking: Reported on 01/01/2019)    . amoxicillin (AMOXIL) 500 MG tablet 2 tablets po BID x 10 days 40 tablet 0  . fluconazole (DIFLUCAN) 200 MG tablet Take 1 tablet (200 mg total) by mouth daily. (Patient not taking: Reported on 11/28/2019) 30 tablet 11  . HYDROcodone-acetaminophen (NORCO) 5-325 MG tablet Take 1 tablet by mouth every 6 (six) hours as needed. 12 tablet 0  . mupirocin cream (BACTROBAN) 2 % Apply topically 4 (four) times daily. To left ear (Patient not taking: Reported on 10/03/2018) 15 g 0  . ondansetron (ZOFRAN ODT) 4 MG disintegrating tablet Take 1 tablet (4 mg total) by mouth every 8 (eight) hours as needed for nausea or vomiting. (Patient not taking: Reported on 10/03/2018) 20 tablet 0  . traZODone (DESYREL) 50 MG tablet TAKE 0.5-1 TABLETS (25-50 MG TOTAL) BY MOUTH AT BEDTIME AS NEEDED FOR SLEEP. (Patient not taking: Reported on 02/25/2019) 30 tablet 1  . triamcinolone ointment (KENALOG) 0.5 % APPLY TO AFFECTED AREA TWICE A DAY (Patient not taking: Reported on 11/28/2019) 30 g 1   No facility-administered medications prior to visit.     No Known Allergies  Social History   Tobacco Use  . Smoking status: Current Every Day Smoker    Packs/day: 1.00    Types: Cigarettes  . Smokeless tobacco: Never Used  Vaping Use  .  Vaping Use: Never used  Substance Use Topics  . Alcohol use: Yes    Comment: 1/2 pint on weekend ; liquor  . Drug use: Yes    Frequency: 7.0 times per week    Types: Marijuana    Comment: daily    Family History  Problem Relation Age of Onset  . Diabetes Mother   . Hypertension Mother   . Diabetes Father   . Hypertension Father   . Healthy Sister   . Healthy Brother   . Healthy Sister   . Healthy Sister   . Healthy  Sister   . Healthy Brother   . Healthy Brother   . Heart disease Neg Hx   . Cancer Neg Hx   . Stroke Neg Hx     Social History   Substance and Sexual Activity  Sexual Activity Not Currently  . Partners: Female  . Birth control/protection: Condom   Comment: declined condoms 02/2019     Objective:   There were no vitals filed for this visit. There is no height or weight on file to calculate BMI.  Physical Exam Pulmonary:     Effort: Pulmonary effort is normal.     Comments: No shortness of breath detected in conversation.  Neurological:     Mental Status: He is oriented to person, place, and time.  Psychiatric:        Mood and Affect: Mood normal.        Behavior: Behavior normal.        Thought Content: Thought content normal.        Judgment: Judgment normal.   He sounds angry and frustrated at points of the conversation. Then short, abrupt answers if any.   Lab Results Lab Results  Component Value Date   WBC 7.0 04/13/2020   HGB 17.3 (H) 04/13/2020   HCT 48.6 04/13/2020   MCV 90.7 04/13/2020   PLT 214 04/13/2020    Lab Results  Component Value Date   CREATININE 0.98 04/13/2020   BUN 9 04/13/2020   NA 143 04/13/2020   K 4.2 04/13/2020   CL 108 04/13/2020   CO2 30 04/13/2020    Lab Results  Component Value Date   ALT 14 04/13/2020   AST 12 04/13/2020   ALKPHOS 91 02/25/2019   BILITOT 0.5 04/13/2020    Lab Results  Component Value Date   CHOL 166 12/22/2019   HDL 40 12/22/2019   LDLCALC 97 12/22/2019   TRIG 195 (H) 12/22/2019   CHOLHDL 4.2 12/22/2019   HIV 1 RNA Quant  Date Value  04/13/2020 1,270 Copies/mL (H)  12/22/2019 165 Copies/mL (H)  08/12/2019 361 copies/mL (H)   CD4 T Cell Abs (/uL)  Date Value  04/13/2020 288 (L)  12/22/2019 209 (L)  08/12/2019 270 (L)     Assessment & Plan:   Problem List Items Addressed This Visit      High   HIV (human immunodeficiency virus infection) ([place]) (Chronic)    Have not seen him in clinic  in some time. He is not interested in coming in to the clinic for care and feels it is unnecessary. While his CD4 has improved and maintained ~250 his viral loads have never been completely suppressed and most recently > 1000 copies. Reminded him that this level of virus is considered at risk for sexual transmission to partners and his health is not fully benefited from this. He runs the risk of treatment failure and emergence of viral resistance rendering it  potentially very difficult to treat him completely in the future. I tried to spend time discussing this with him on the phone, however unclear if it impacted.   I told him I would really prefer to have him come in so we can work together to try to help him be more successful - I am not confident he will accept this.   I made him an appointment in a month to recheck VL.         Follow Up Instructions: Continue Biktarvy Needs better adherence - labs in 1 month    I discussed the assessment and treatment plan with the patient. The patient was provided an opportunity to ask questions and all were answered. The patient agreed with the plan and demonstrated an understanding of the instructions.   The patient was advised to call back or seek an in-person evaluation if the symptoms worsen or if the condition fails to improve as anticipated.  I provided 12 minutes of non-face-to-face time during this encounter.   Janene Madeira, MSN, NP-C [facility] for Infectious Disease [email or web address] Pager: [phone] Office: [place]: [phone]   08/09/20  1:39 PM

## 2020-07-27 ENCOUNTER — Other Ambulatory Visit: Payer: Self-pay | Admitting: Infectious Diseases

## 2020-07-27 DIAGNOSIS — B20 Human immunodeficiency virus [HIV] disease: Secondary | ICD-10-CM

## 2020-08-05 ENCOUNTER — Other Ambulatory Visit: Payer: Self-pay

## 2020-08-05 ENCOUNTER — Telehealth: Payer: Self-pay

## 2020-08-05 DIAGNOSIS — B20 Human immunodeficiency virus [HIV] disease: Secondary | ICD-10-CM

## 2020-08-05 DIAGNOSIS — Z21 Asymptomatic human immunodeficiency virus [HIV] infection status: Secondary | ICD-10-CM

## 2020-08-05 NOTE — Telephone Encounter (Signed)
Patient called office stating he has not been able to fill his Biktarvy at [facility] for two weeks. States that he waiting on a call back from them, but never heard back from anyone.  When CMA called [facility] was told that prescription is ready to pick up. Does not have a copay. Has two refills remaining on file.  Updated patient on this . Requested he pick up medicine today. Will have front desk schedule lab and follow up in July. Juanita Laster, RMA

## 2020-08-09 ENCOUNTER — Encounter: Payer: Self-pay | Admitting: Infectious Diseases

## 2020-08-09 NOTE — Assessment & Plan Note (Signed)
Have not seen him in clinic in some time. He is not interested in coming in to the clinic for care and feels it is unnecessary. While his CD4 has improved and maintained ~250 his viral loads have never been completely suppressed and most recently > 1000 copies. Reminded him that this level of virus is considered at risk for sexual transmission to partners and his health is not fully benefited from this. He runs the risk of treatment failure and emergence of viral resistance rendering it potentially very difficult to treat him completely in the future. I tried to spend time discussing this with him on the phone, however unclear if it impacted.   I told him I would really prefer to have him come in so we can work together to try to help him be more successful - I am not confident he will accept this.   I made him an appointment in a month to recheck VL.

## 2020-10-14 ENCOUNTER — Other Ambulatory Visit: Payer: Self-pay

## 2020-11-01 ENCOUNTER — Ambulatory Visit: Payer: Self-pay | Admitting: Infectious Diseases

## 2020-11-30 ENCOUNTER — Ambulatory Visit: Payer: Self-pay | Admitting: Infectious Diseases

## 2020-12-30 ENCOUNTER — Other Ambulatory Visit: Payer: Self-pay | Admitting: Infectious Diseases

## 2020-12-30 DIAGNOSIS — B20 Human immunodeficiency virus [HIV] disease: Secondary | ICD-10-CM

## 2020-12-30 NOTE — Telephone Encounter (Signed)
Patient stated he would call back to schedule an appointment.

## 2020-12-31 ENCOUNTER — Telehealth: Payer: Self-pay

## 2020-12-31 NOTE — Telephone Encounter (Signed)
I spoke with the patient regarding his refill request. Patient stated he has not taken his medication in 2 weeks. Patient advised he is past due for a follow up and labs. Patient has been scheduled for 9/27 and advised him that he will get refills at his appointment. Patient verbalized understanding. Also clarified with Marchelle Folks with pharmacy that it is ok for patient to wait on refills. Per Marchelle Folks okay that patient wait until Tues.

## 2020-12-31 NOTE — Telephone Encounter (Signed)
Patient called wanting to talk to [facility], I informed him she was in clinic and I could send a message. York Spaniel he needed to talk to her about a prescription, Did not know the name of the medication, best contact number is [phone]

## 2021-01-03 NOTE — Telephone Encounter (Signed)
Appt 9/27

## 2021-01-04 ENCOUNTER — Ambulatory Visit: Payer: Self-pay | Admitting: Infectious Diseases

## 2021-01-04 ENCOUNTER — Ambulatory Visit: Payer: Self-pay

## 2021-01-04 NOTE — Telephone Encounter (Signed)
Appt 9/28

## 2021-01-05 ENCOUNTER — Ambulatory Visit: Payer: Self-pay | Admitting: Infectious Diseases

## 2021-01-06 ENCOUNTER — Other Ambulatory Visit: Payer: Self-pay

## 2021-01-06 ENCOUNTER — Ambulatory Visit: Payer: Self-pay

## 2021-01-06 ENCOUNTER — Ambulatory Visit: Payer: Self-pay | Admitting: Infectious Diseases

## 2021-01-06 ENCOUNTER — Ambulatory Visit (INDEPENDENT_AMBULATORY_CARE_PROVIDER_SITE_OTHER): Payer: Self-pay | Admitting: Pharmacist

## 2021-01-06 ENCOUNTER — Encounter: Payer: Self-pay | Admitting: Infectious Diseases

## 2021-01-06 DIAGNOSIS — B20 Human immunodeficiency virus [HIV] disease: Secondary | ICD-10-CM

## 2021-01-06 MED ORDER — BIKTARVY 50-200-25 MG PO TABS: 1.0000 | ORAL_TABLET | Freq: Every day | ORAL | 2 refills | Status: DC

## 2021-01-06 NOTE — Progress Notes (Signed)
01/06/2021  HPI: Christopher Henson is a 40 y.o. male who presents to the [facility] pharmacy clinic for HIV follow-up.  Patient Active Problem List   Diagnosis Date Noted   Dental abscess 11/28/2019   Need for hepatitis A and B vaccination 02/25/2019   Vaccine counseling 02/25/2019   Thrombocytopenia (HCC) 02/25/2019   Elevated liver enzymes 02/25/2019   Influenza vaccination declined 02/25/2019   Alcohol use 02/21/2019   Depression 12/10/2018   Poor dentition 10/03/2018   HIV (human immunodeficiency virus infection) (HCC) 09/05/2018   Protein-calorie malnutrition, severe 06/10/2018   History of cryptococcal meningitis     Patient's Medications  New Prescriptions   No medications on file  Previous Medications   ACETAMINOPHEN (TYLENOL) 325 MG TABLET    Take 2 tablets (650 mg total) by mouth every 6 (six) hours as needed for mild pain (or Fever >/= 101).   AMOXICILLIN (AMOXIL) 500 MG TABLET    2 tablets po BID x 10 days   BIKTARVY 50-200-25 MG TABS TABLET    TAKE 1 TABLET BY MOUTH DAILY   FLUCONAZOLE (DIFLUCAN) 200 MG TABLET    Take 1 tablet (200 mg total) by mouth daily.   HYDROCODONE-ACETAMINOPHEN (NORCO) 5-325 MG TABLET    Take 1 tablet by mouth every 6 (six) hours as needed.   MUPIROCIN CREAM (BACTROBAN) 2 %    Apply topically 4 (four) times daily. To left ear   ONDANSETRON (ZOFRAN ODT) 4 MG DISINTEGRATING TABLET    Take 1 tablet (4 mg total) by mouth every 8 (eight) hours as needed for nausea or vomiting.   TRAZODONE (DESYREL) 50 MG TABLET    TAKE 0.5-1 TABLETS (25-50 MG TOTAL) BY MOUTH AT BEDTIME AS NEEDED FOR SLEEP.   TRIAMCINOLONE OINTMENT (KENALOG) 0.5 %    APPLY TO AFFECTED AREA TWICE A DAY  Modified Medications   No medications on file  Discontinued Medications   No medications on file    Allergies: No Known Allergies  Past Medical History: Past Medical History:  Diagnosis Date   AIDS (acquired immune deficiency syndrome) (HCC) 06/2018   Alcohol abuse    Cryptococcal  meningitis (HCC) 05/2018   GERD (gastroesophageal reflux disease)    History of cryptococcal meningitis    Protein calorie malnutrition (HCC)    Thrombocytopenia (HCC) 06/2018    Social History: Social History   Socioeconomic History   Marital status: Single    Spouse name: Not on file   Number of children: Not on file   Years of education: Not on file   Highest education level: Not on file  Occupational History   Not on file  Tobacco Use   Smoking status: Every Day    Packs/day: 1.00    Types: Cigarettes   Smokeless tobacco: Never  Vaping Use   Vaping Use: Never used  Substance and Sexual Activity   Alcohol use: Yes    Comment: 1/2 pint on weekend ; liquor   Drug use: Yes    Frequency: 7.0 times per week    Types: Marijuana    Comment: daily   Sexual activity: Not Currently    Partners: Female    Birth control/protection: Condom    Comment: declined condoms 02/2019  Other Topics Concern   Not on file  Social History Narrative   Lives with sister and her kids.   Works at [employer].  Does physical labor on the job.   Has 23yo son in [place].   12/2018   Social  Determinants of Health   Financial Resource Strain: Not on file  Food Insecurity: Not on file  Transportation Needs: Not on file  Physical Activity: Not on file  Stress: Not on file  Social Connections: Not on file    Labs: Lab Results  Component Value Date   HIV1RNAQUANT 1,270 (H) 04/13/2020   HIV1RNAQUANT 165 (H) 12/22/2019   HIV1RNAQUANT 361 (H) 08/12/2019   CD4TABS 288 (L) 04/13/2020   CD4TABS 209 (L) 12/22/2019   CD4TABS 270 (L) 08/12/2019    RPR and STI Lab Results  Component Value Date   LABRPR NON-REACTIVE 12/22/2019   LABRPR Non Reactive 06/10/2018    STI Results GC CT  06/10/2018 Negative Negative    Hepatitis B Lab Results  Component Value Date   HEPBSAG Negative 06/09/2018   Hepatitis C No results found for: HEPCAB, HCVRNAPCRQN Hepatitis A No results found for:  HAV Lipids: Lab Results  Component Value Date   CHOL 166 12/22/2019   TRIG 195 (H) 12/22/2019   HDL 40 12/22/2019   CHOLHDL 4.2 12/22/2019   VLDL 39 06/10/2018   LDLCALC 97 12/22/2019    Current HIV Regimen: Biktarvy   Assessment: Patient presents to clinic today to obtain lab-work for HIV management. Patient last seen in clinic in January by Judeth Cornfield at which point his CD4 count was 288, and his viral load was 1,270. Patient has a history of non-adherence to [facility] as well as a history of multiple opportunistic infections, including cryptococcal meningitis, PJP pneumonia, and oral candidiasis. Patient reports no symptoms of these disease states during the appointment today, and states he has been out of his Biktarvy for ~3 weeks.   Patient states he is having issues coming to appointments due to transportation. I offered to set him up with someone who could help him get transportation, but he was concerned about people knowing his HIV status. I told him they could just transport him to the clinic or the surrounding area. Patient stated he still was not interested. Patient did not understand why he could not just get his Biktarvy filled without coming in to the clinic. I explained to him we had to make sure his virus was suppressed and we were treating him with the correct medication which he did not truly seem to understand. I said he had to come in to the clinic periodically to get lab work and he said he wasn't going to get lab work today and he was tired of being poked. At this point, re-emphasized the importance of getting lab work to make sure he was being treated adequately/appropriately and he agreed to receive labs today.  At the conclusion of the appointment, he mentioned under his breath that he was depressed. I offered to get him a referral to speak with a physician and/or counselor about his depression. In response, he said, "they can't help me, what are they going to do for me?   Can they do anything about the fact I hate women. A woman gave me the disease. Can they help me with that?" Due to this conversation, and a discussion with Judeth Cornfield, we thought it would be more appropriate for Mr. Lockridge to begin seeing Dr. [name] for his follow-up appointments.    Plan: - F/U HIV RNA with reflex for mutation analysis, CD4, CMET  - Continue Biktarvy   Jani Gravel, PharmD PGY-1 Acute Care Resident  01/06/2021 2:17 PM

## 2021-01-07 LAB — T-HELPER CELL (CD4) - (RCID CLINIC ONLY)
CD4 % Helper T Cell: 14 % — ABNORMAL LOW (ref 33–65)
CD4 T Cell Abs: 224 /uL — ABNORMAL LOW (ref 400–1790)

## 2021-01-14 LAB — COMPREHENSIVE METABOLIC PANEL
AG Ratio: 2 (calc) (ref 1.0–2.5)
ALT: 9 U/L (ref 9–46)
AST: 10 U/L (ref 10–40)
Albumin: 4.8 g/dL (ref 3.6–5.1)
Alkaline phosphatase (APISO): 81 U/L (ref 36–130)
BUN: 8 mg/dL (ref 7–25)
CO2: 28 mmol/L (ref 20–32)
Calcium: 9.4 mg/dL (ref 8.6–10.3)
Chloride: 106 mmol/L (ref 98–110)
Creat: 0.81 mg/dL (ref 0.60–1.29)
Globulin: 2.4 g/dL (calc) (ref 1.9–3.7)
Glucose, Bld: 86 mg/dL (ref 65–99)
Potassium: 4.1 mmol/L (ref 3.5–5.3)
Sodium: 143 mmol/L (ref 135–146)
Total Bilirubin: 0.6 mg/dL (ref 0.2–1.2)
Total Protein: 7.2 g/dL (ref 6.1–8.1)

## 2021-01-14 LAB — HIV RNA, RTPCR W/R GT (RTI, PI,INT)
HIV 1 RNA Quant: 76100 copies/mL — ABNORMAL HIGH
HIV-1 RNA Quant, Log: 4.88 Log copies/mL — ABNORMAL HIGH

## 2021-01-14 LAB — HIV-1 INTEGRASE GENOTYPE

## 2021-01-14 LAB — HIV-1 GENOTYPE: HIV-1 Genotype: DETECTED — AB

## 2021-03-09 ENCOUNTER — Ambulatory Visit: Payer: Self-pay | Admitting: Internal Medicine

## 2021-03-14 ENCOUNTER — Other Ambulatory Visit: Payer: Self-pay

## 2021-03-14 ENCOUNTER — Ambulatory Visit (INDEPENDENT_AMBULATORY_CARE_PROVIDER_SITE_OTHER): Payer: Self-pay | Admitting: Internal Medicine

## 2021-03-14 ENCOUNTER — Encounter: Payer: Self-pay | Admitting: Internal Medicine

## 2021-03-14 VITALS — BP 130/90 | HR 85 | Temp 98.4°F | Wt 166.0 lb

## 2021-03-14 DIAGNOSIS — B20 Human immunodeficiency virus [HIV] disease: Secondary | ICD-10-CM

## 2021-03-14 DIAGNOSIS — Z2821 Immunization not carried out because of patient refusal: Secondary | ICD-10-CM

## 2021-03-14 MED ORDER — BIKTARVY 50-200-25 MG PO TABS: 1.0000 | ORAL_TABLET | Freq: Every day | ORAL | 5 refills | Status: DC

## 2021-03-14 MED ORDER — DARUNAVIR-COBICISTAT 800-150 MG PO TABS: 1.0000 | ORAL_TABLET | Freq: Every day | ORAL | 5 refills | Status: DC

## 2021-03-15 ENCOUNTER — Encounter: Payer: Self-pay | Admitting: Internal Medicine

## 2021-03-15 NOTE — Assessment & Plan Note (Signed)
I discussed the resistance mutation, the need to be on treatment daily and discussed adding Prezcobix.  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  He is in agreement with this.

## 2021-03-15 NOTE — Progress Notes (Signed)
   Subjective:    Patient ID: Christopher Henson, male    DOB: 01/11/1981, 40 y.o.   MRN: [id number]  HPI Christopher Henson is here for follow up of HIV He has been on Biktarvy but has a long history of sporadic compliance.  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  He has remained on Biktarvy and does endorse continued poor compliance but interested in taking consistently and getting better.  He continues to be depressed and states he of course is depressed because he has HIV and it is no surprise.  He though does not have any suicidal ideation and is not interested in counseling.  He has no complaints today. He does not want a flu shot.  Last CD 4 was 224 and viral load 76,110 with a recent baseline of 653,000.     Review of Systems  Constitutional:  Negative for fatigue.  Gastrointestinal:  Negative for diarrhea and nausea.  Skin:  Negative for rash.      Objective:   Physical Exam Eyes:     General: No scleral icterus. Pulmonary:     Effort: Pulmonary effort is normal.  Skin:    Findings: No rash.  Neurological:     General: No focal deficit present.     Mental Status: He is alert.  Psychiatric:        Mood and Affect: Mood normal.    SH: + tobacco      Assessment & Plan:

## 2021-03-15 NOTE — Assessment & Plan Note (Signed)
He declined a flu shot

## 2021-06-29 ENCOUNTER — Ambulatory Visit: Payer: Self-pay

## 2021-06-29 ENCOUNTER — Other Ambulatory Visit: Payer: Self-pay

## 2021-07-13 ENCOUNTER — Ambulatory Visit: Payer: Self-pay | Admitting: Internal Medicine

## 2021-09-01 ENCOUNTER — Encounter: Payer: Self-pay | Admitting: Infectious Diseases

## 2021-12-14 ENCOUNTER — Encounter: Payer: Self-pay | Admitting: Internal Medicine

## 2022-01-17 ENCOUNTER — Encounter: Payer: Self-pay | Admitting: Internal Medicine

## 2022-02-09 ENCOUNTER — Other Ambulatory Visit: Payer: Self-pay | Admitting: Internal Medicine

## 2022-02-09 DIAGNOSIS — B20 Human immunodeficiency virus [HIV] disease: Secondary | ICD-10-CM

## 2022-02-09 MED ORDER — DARUNAVIR-COBICISTAT 800-150 MG PO TABS: 1.0000 | ORAL_TABLET | Freq: Every day | ORAL | 0 refills | Status: DC

## 2022-02-09 NOTE — Telephone Encounter (Signed)
Patient overdue for appointment, he accepts appointment for next week. He has been taking his Biktarvy, but says that the pharmacy did not give him Prezcobix the last time he filled. Emphasized importance of taking both together. Advised him 30 day supply will be sent. Additional refills after 02/13/22 appointment.   Beryle Flock, RN

## 2022-02-13 ENCOUNTER — Ambulatory Visit: Payer: Self-pay | Admitting: Internal Medicine

## 2022-02-21 ENCOUNTER — Ambulatory Visit: Payer: Self-pay | Admitting: Internal Medicine

## 2022-03-21 ENCOUNTER — Other Ambulatory Visit: Payer: Self-pay | Admitting: Internal Medicine

## 2022-03-21 ENCOUNTER — Ambulatory Visit: Payer: Self-pay | Admitting: Pharmacist

## 2022-03-21 DIAGNOSIS — B20 Human immunodeficiency virus [HIV] disease: Secondary | ICD-10-CM

## 2022-04-19 ENCOUNTER — Ambulatory Visit: Payer: Self-pay | Admitting: Pharmacist

## 2022-05-29 ENCOUNTER — Other Ambulatory Visit: Payer: Self-pay

## 2022-05-29 ENCOUNTER — Emergency Department (HOSPITAL_COMMUNITY)
Admission: EM | Admit: 2022-05-29 | Discharge: 2022-05-29 | Disposition: A | Discharge status: Home / Self Care | Payer: 59 | Attending: Emergency Medicine | Admitting: Emergency Medicine

## 2022-05-29 ENCOUNTER — Encounter (HOSPITAL_COMMUNITY): Payer: Self-pay

## 2022-05-29 DIAGNOSIS — S3992XA Unspecified injury of lower back, initial encounter: Secondary | ICD-10-CM | POA: Diagnosis not present

## 2022-05-29 DIAGNOSIS — S39012A Strain of muscle, fascia and tendon of lower back, initial encounter: Secondary | ICD-10-CM | POA: Insufficient documentation

## 2022-05-29 DIAGNOSIS — Y92410 Unspecified street and highway as the place of occurrence of the external cause: Secondary | ICD-10-CM | POA: Insufficient documentation

## 2022-05-29 MED ORDER — METHOCARBAMOL 500 MG PO TABS: 500.0000 mg | ORAL_TABLET | Freq: Two times a day (BID) | ORAL | 0 refills | Status: DC
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
## 2022-05-29 NOTE — Discharge Instructions (Addendum)
Take ibuprofen as directed

## 2022-05-30 ENCOUNTER — Other Ambulatory Visit: Payer: Self-pay | Admitting: Internal Medicine

## 2022-05-30 DIAGNOSIS — B20 Human immunodeficiency virus [HIV] disease: Secondary | ICD-10-CM

## 2022-05-30 NOTE — Telephone Encounter (Signed)
Last seen 03/2021 - needs appointment. Sent MyChart message to schedule.   Beryle Flock, RN

## 2022-06-05 DIAGNOSIS — K089 Disorder of teeth and supporting structures, unspecified: Secondary | ICD-10-CM | POA: Diagnosis not present

## 2022-06-05 DIAGNOSIS — R030 Elevated blood-pressure reading, without diagnosis of hypertension: Secondary | ICD-10-CM | POA: Diagnosis not present

## 2022-06-05 DIAGNOSIS — K122 Cellulitis and abscess of mouth: Secondary | ICD-10-CM | POA: Diagnosis not present

## 2022-06-05 DIAGNOSIS — Z6822 Body mass index (BMI) 22.0-22.9, adult: Secondary | ICD-10-CM | POA: Diagnosis not present

## 2022-06-07 ENCOUNTER — Telehealth: Payer: Self-pay

## 2022-06-07 NOTE — Telephone Encounter (Signed)
Patient called, reports he has an abscess on the left side of his face that he noticed three days ago. Says it has been swelling up since then, denies fever.   He agrees to move his appointment up to tomorrow. Advised that if symptoms worsen he can seek care at urgent care in the meantime. Patient verbalized understanding and has no further questions.   Beryle Flock, RN

## 2022-06-08 ENCOUNTER — Encounter: Payer: Self-pay | Admitting: Internal Medicine

## 2022-06-08 ENCOUNTER — Ambulatory Visit (INDEPENDENT_AMBULATORY_CARE_PROVIDER_SITE_OTHER): Payer: 59 | Admitting: Internal Medicine

## 2022-06-08 ENCOUNTER — Other Ambulatory Visit: Payer: Self-pay

## 2022-06-08 VITALS — BP 161/105 | HR 82 | Temp 98.2°F | Ht 72.0 in | Wt 183.0 lb

## 2022-06-08 DIAGNOSIS — Z79899 Other long term (current) drug therapy: Secondary | ICD-10-CM | POA: Insufficient documentation

## 2022-06-08 DIAGNOSIS — K047 Periapical abscess without sinus: Secondary | ICD-10-CM | POA: Diagnosis not present

## 2022-06-08 DIAGNOSIS — B20 Human immunodeficiency virus [HIV] disease: Secondary | ICD-10-CM

## 2022-06-08 DIAGNOSIS — Z2821 Immunization not carried out because of patient refusal: Secondary | ICD-10-CM

## 2022-06-08 DIAGNOSIS — Z113 Encounter for screening for infections with a predominantly sexual mode of transmission: Secondary | ICD-10-CM

## 2022-06-08 MED ORDER — AMOXICILLIN 500 MG PO CAPS: 500.0000 mg | ORAL_CAPSULE | Freq: Three times a day (TID) | ORAL | 0 refills | Status: DC

## 2022-06-08 MED ORDER — BIKTARVY 50-200-25 MG PO TABS: 1.0000 | ORAL_TABLET | Freq: Every day | ORAL | 5 refills | Status: DC

## 2022-06-08 MED ORDER — PREZCOBIX 800-150 MG PO TABS: ORAL_TABLET | ORAL | 5 refills | Status: DC

## 2022-06-08 NOTE — Assessment & Plan Note (Signed)
Dental referral placed today for [facility]. Information to schedule appointment completed today.    Amoxicillin given for the abscess and dental clinc scheduled

## 2022-06-08 NOTE — Assessment & Plan Note (Signed)
>>  ASSESSMENT AND PLAN FOR DENTAL ABSCESS WRITTEN ON 06/08/2022 11:17 AM BY COMER, LAMAR ORN, MD  Dental referral placed today for [facility]. Information to schedule appointment completed today.    Amoxicillin  given for the abscess and dental clinc scheduled

## 2022-06-08 NOTE — Assessment & Plan Note (Signed)
He reports doing well with no issues getting his medication.  Will check his labs today and refills sent.   He otherwise can rtc in 6 months.

## 2022-06-08 NOTE — Assessment & Plan Note (Signed)
Will screen

## 2022-06-08 NOTE — Assessment & Plan Note (Signed)
Declined vaccines today

## 2022-06-08 NOTE — Progress Notes (Signed)
Declined [facility] referral.  [facility] Referral placed. Patient currently scheduled for 3/25 at 1pm

## 2022-06-08 NOTE — Progress Notes (Signed)
   Subjective:    Patient ID: Christopher Henson, male    DOB: 04/13/1980, 42 y.o.   MRN: [id number]  HPI Tayshon is here for follow up of HIV He has a history of a 184v mutation and has remained on Biktarvy and Prezcobix with no issues.  Last seen in September 2022 off of medications but has been on since by his report.  He is having signfiicant dental pain and pus noted.  He was seen in urgent care and given clindamycin.     Review of Systems  Constitutional:  Negative for chills and fever.  Gastrointestinal:  Negative for diarrhea and nausea.  Skin:  Negative for rash.       Objective:   Physical Exam HENT:     Mouth/Throat:     Comments: Face is swollen, difficulty opening hs mouth.  Poor dentition throughout.  Missing teeth Eyes:     General: No scleral icterus. Pulmonary:     Effort: Pulmonary effort is normal.  Neurological:     Mental Status: He is alert.    SH: + tobacco       Assessment & Plan:

## 2022-06-09 ENCOUNTER — Ambulatory Visit: Payer: 59 | Admitting: Internal Medicine

## 2022-06-09 LAB — T-HELPER CELL (CD4) - (RCID CLINIC ONLY)
CD4 % Helper T Cell: 16 % — ABNORMAL LOW (ref 33–65)
CD4 T Cell Abs: 285 /uL — ABNORMAL LOW (ref 400–1790)

## 2022-06-10 LAB — COMPLETE METABOLIC PANEL WITH GFR
AG Ratio: 1.7 (calc) (ref 1.0–2.5)
ALT: 9 U/L (ref 9–46)
AST: 10 U/L (ref 10–40)
Albumin: 4.4 g/dL (ref 3.6–5.1)
Alkaline phosphatase (APISO): 74 U/L (ref 36–130)
BUN: 7 mg/dL (ref 7–25)
CO2: 28 mmol/L (ref 20–32)
Calcium: 9.2 mg/dL (ref 8.6–10.3)
Chloride: 107 mmol/L (ref 98–110)
Creat: 1.04 mg/dL (ref 0.60–1.29)
Globulin: 2.6 g/dL (calc) (ref 1.9–3.7)
Glucose, Bld: 101 mg/dL — ABNORMAL HIGH (ref 65–99)
Potassium: 3.7 mmol/L (ref 3.5–5.3)
Sodium: 143 mmol/L (ref 135–146)
Total Bilirubin: 0.6 mg/dL (ref 0.2–1.2)
Total Protein: 7 g/dL (ref 6.1–8.1)
eGFR: 91 mL/min/{1.73_m2} (ref >=60)

## 2022-06-10 LAB — LIPID PANEL
Cholesterol: 175 mg/dL (ref <200)
HDL: 42 mg/dL (ref >=40)
LDL Cholesterol (Calc): 106 mg/dL (calc) — ABNORMAL HIGH
Non-HDL Cholesterol (Calc): 133 mg/dL (calc) — ABNORMAL HIGH (ref <130)
Total CHOL/HDL Ratio: 4.2 (calc) (ref <5.0)
Triglycerides: 155 mg/dL — ABNORMAL HIGH (ref <150)

## 2022-06-10 LAB — HIV-1 RNA QUANT-NO REFLEX-BLD
HIV 1 RNA Quant: 54 Copies/mL — ABNORMAL HIGH
HIV-1 RNA Quant, Log: 1.73 Log cps/mL — ABNORMAL HIGH

## 2022-06-10 LAB — CBC WITH DIFFERENTIAL/PLATELET
Absolute Monocytes: 783 cells/uL (ref 200–950)
Basophils Absolute: 63 cells/uL (ref 0–200)
Basophils Relative: 0.7 %
Eosinophils Absolute: 171 cells/uL (ref 15–500)
Eosinophils Relative: 1.9 %
HCT: 47.4 % (ref 38.5–50.0)
Hemoglobin: 16 g/dL (ref 13.2–17.1)
Lymphs Abs: 1908 cells/uL (ref 850–3900)
MCH: 30.7 pg (ref 27.0–33.0)
MCHC: 33.8 g/dL (ref 32.0–36.0)
MCV: 91 fL (ref 80.0–100.0)
MPV: 11.7 fL (ref 7.5–12.5)
Monocytes Relative: 8.7 %
Neutro Abs: 6075 cells/uL (ref 1500–7800)
Neutrophils Relative %: 67.5 %
Platelets: 225 10*3/uL (ref 140–400)
RBC: 5.21 10*6/uL (ref 4.20–5.80)
RDW: 13.1 % (ref 11.0–15.0)
Total Lymphocyte: 21.2 %
WBC: 9 10*3/uL (ref 3.8–10.8)

## 2022-06-10 LAB — RPR: RPR Ser Ql: NONREACTIVE

## 2022-06-13 ENCOUNTER — Encounter: Payer: Self-pay | Admitting: Internal Medicine

## 2022-07-27 ENCOUNTER — Other Ambulatory Visit: Payer: Self-pay | Admitting: Internal Medicine

## 2022-07-27 DIAGNOSIS — B20 Human immunodeficiency virus [HIV] disease: Secondary | ICD-10-CM

## 2022-07-31 ENCOUNTER — Other Ambulatory Visit: Payer: Self-pay

## 2022-07-31 DIAGNOSIS — B20 Human immunodeficiency virus [HIV] disease: Secondary | ICD-10-CM

## 2022-07-31 MED ORDER — PREZCOBIX 800-150 MG PO TABS: ORAL_TABLET | ORAL | 2 refills | Status: DC

## 2022-07-31 MED ORDER — BIKTARVY 50-200-25 MG PO TABS: 1.0000 | ORAL_TABLET | Freq: Every day | ORAL | 2 refills | Status: DC

## 2022-08-02 ENCOUNTER — Other Ambulatory Visit: Payer: Self-pay

## 2022-08-02 ENCOUNTER — Telehealth: Payer: Self-pay

## 2022-08-02 DIAGNOSIS — B20 Human immunodeficiency virus [HIV] disease: Secondary | ICD-10-CM

## 2022-08-02 MED ORDER — PREZCOBIX 800-150 MG PO TABS
ORAL_TABLET | ORAL | 2 refills | Status: DC
Start: 2022-08-02 — End: 2022-12-14

## 2022-08-02 MED ORDER — BIKTARVY 50-200-25 MG PO TABS
1.0000 | ORAL_TABLET | Freq: Every day | ORAL | 2 refills | Status: DC
Start: 2022-08-02 — End: 2022-12-14

## 2022-08-02 NOTE — Telephone Encounter (Signed)
Spoke to pt to get a copy of his insurance card to send for RW application update.  The patient gave me three different date of births and [employer] site says they do not match with this name.  He is going to try an email me a copy of his card.

## 2022-08-02 NOTE — Telephone Encounter (Signed)
Patient called office stating he is having issues getting his medication filled by pharmacy. Was told that he needs prescription for Biktarvy. Called [facility] and was told that patient insurance is requiring he use [facility].  Rx resent. Called and updated patient.  Will call office if he continues to have issues getting refill. Has been off medication two weeks now. Will update provider. Juanita Laster, RMA

## 2022-08-16 ENCOUNTER — Other Ambulatory Visit (HOSPITAL_COMMUNITY): Payer: Self-pay

## 2022-09-04 ENCOUNTER — Encounter (HOSPITAL_COMMUNITY): Payer: Self-pay | Admitting: Emergency Medicine

## 2022-09-04 ENCOUNTER — Emergency Department (HOSPITAL_COMMUNITY): Payer: 59

## 2022-09-04 ENCOUNTER — Emergency Department (HOSPITAL_COMMUNITY)
Admission: EM | Admit: 2022-09-04 | Discharge: 2022-09-04 | Disposition: A | Discharge status: Home / Self Care | Payer: 59 | Attending: Emergency Medicine | Admitting: Emergency Medicine

## 2022-09-04 ENCOUNTER — Other Ambulatory Visit: Payer: Self-pay

## 2022-09-04 DIAGNOSIS — S00211A Abrasion of right eyelid and periocular area, initial encounter: Secondary | ICD-10-CM | POA: Diagnosis not present

## 2022-09-04 DIAGNOSIS — Z21 Asymptomatic human immunodeficiency virus [HIV] infection status: Secondary | ICD-10-CM | POA: Diagnosis not present

## 2022-09-04 DIAGNOSIS — R079 Chest pain, unspecified: Secondary | ICD-10-CM | POA: Diagnosis not present

## 2022-09-04 DIAGNOSIS — W19XXXA Unspecified fall, initial encounter: Secondary | ICD-10-CM | POA: Insufficient documentation

## 2022-09-04 DIAGNOSIS — R002 Palpitations: Secondary | ICD-10-CM | POA: Insufficient documentation

## 2022-09-04 DIAGNOSIS — R0789 Other chest pain: Secondary | ICD-10-CM | POA: Diagnosis not present

## 2022-09-04 DIAGNOSIS — J329 Chronic sinusitis, unspecified: Secondary | ICD-10-CM | POA: Diagnosis not present

## 2022-09-04 DIAGNOSIS — S0990XA Unspecified injury of head, initial encounter: Secondary | ICD-10-CM | POA: Diagnosis not present

## 2022-09-04 DIAGNOSIS — R55 Syncope and collapse: Secondary | ICD-10-CM | POA: Diagnosis not present

## 2022-09-04 LAB — TROPONIN I (HIGH SENSITIVITY)
Troponin I (High Sensitivity): 4 ng/L (ref <18)
Troponin I (High Sensitivity): 4 ng/L (ref <18)

## 2022-09-04 LAB — BASIC METABOLIC PANEL
Anion gap: 13 (ref 5–15)
BUN: 5 mg/dL — ABNORMAL LOW (ref 6–20)
CO2: 21 mmol/L — ABNORMAL LOW (ref 22–32)
Calcium: 8.8 mg/dL — ABNORMAL LOW (ref 8.9–10.3)
Chloride: 106 mmol/L (ref 98–111)
Creatinine, Ser: 1.24 mg/dL (ref 0.61–1.24)
GFR, Estimated: >60 mL/min (ref >60)
Glucose, Bld: 94 mg/dL (ref 70–99)
Potassium: 3.3 mmol/L — ABNORMAL LOW (ref 3.5–5.1)
Sodium: 140 mmol/L (ref 135–145)

## 2022-09-04 LAB — CBC
HCT: 47.5 % (ref 39.0–52.0)
Hemoglobin: 16.6 g/dL (ref 13.0–17.0)
MCH: 31.4 pg (ref 26.0–34.0)
MCHC: 34.9 g/dL (ref 30.0–36.0)
MCV: 90 fL (ref 80.0–100.0)
Platelets: 200 10*3/uL (ref 150–400)
RBC: 5.28 MIL/uL (ref 4.22–5.81)
RDW: 14.4 % (ref 11.5–15.5)
WBC: 5.5 10*3/uL (ref 4.0–10.5)
nRBC: 0 % (ref 0.0–0.2)

## 2022-09-04 LAB — MAGNESIUM: Magnesium: 2.1 mg/dL (ref 1.7–2.4)

## 2022-09-04 MED ORDER — POTASSIUM CHLORIDE CRYS ER 20 MEQ PO TBCR
40.0000 meq | EXTENDED_RELEASE_TABLET | ORAL | Status: AC
  Administered 2022-09-04: 40 meq via ORAL
  Filled 2022-09-04: qty 2

## 2022-09-04 MED ORDER — LACTATED RINGERS IV BOLUS
1000.0000 mL | Freq: Once | INTRAVENOUS | Status: AC
  Administered 2022-09-04: 1000 mL via INTRAVENOUS

## 2022-09-04 NOTE — ED Triage Notes (Signed)
Pt in with [facility] that began PTA. Pt states when this began, he was smoking a cigarette and started to get diaphoretic and dizzy. Arrives with small lac to R eye, states his family said he saw him pass out, does not recall the event. Does report using marijuana this afternoon, no other substances.

## 2022-09-04 NOTE — Discharge Instructions (Addendum)
You were seen today for palpitations and fainting.  Your EKG was slightly abnormal but your lab work and x-ray and CT scan were reassuring.  You will receive a phone call to schedule follow-up with cardiology, you should call your doctor tomorrow to schedule follow-up.  He should drink plenty of fluids at home.  If you develop more palpitations, fainting, chest pain, difficulty breathing you should return to the ED.

## 2022-09-04 NOTE — ED Notes (Signed)
Pt transported to CT ?

## 2022-09-04 NOTE — ED Provider Notes (Signed)
[place] EMERGENCY DEPARTMENT AT [facility] Provider Note   CSN: [id number] Arrival date & time: 09/04/22  1929     History  Chief Complaint  Patient presents with   Chest Pain   Loss of Consciousness    Christopher Henson is a 42 y.o. male.  42 year old male history of HIV, alcohol use, GERD presenting for palpitations and syncope.  Patient states short while ago he was at home alone.  He smoked a cigarette and some marijuana.  He then became diaphoretic and developed palpitations.  He did not have any frank chest pain but was uncomfortable.  He got dizzy and passed out or nearly passed out and fell.  He has a scrape to his right eyebrow.  He has not had any nausea or vomiting since.  He had mild headache since this but none before.  No fevers or chills.  Has chronic cough related to smoking.  His friend drove him here and dropped him off.  Currently has no vision changes, weakness, numbness.  He has no chest pain or palpitations currently but does feel somewhat dizzy when he stands up.  No recent vomiting or diarrhea.  No abdominal pain or difficulty breathing.  Denies any cocaine or methamphetamine or other drug use.   Chest Pain Associated symptoms: cough, palpitations and syncope   Loss of Consciousness Associated symptoms: chest pain and palpitations        Home Medications Prior to Admission medications   Medication Sig Start Date End Date Taking? Authorizing Provider  acetaminophen (TYLENOL) 325 MG tablet Take 2 tablets (650 mg total) by mouth every 6 (six) hours as needed for mild pain (or Fever >/= 101). 06/21/18   Elgergawy, Leana Roe, MD  amoxicillin (AMOXIL) 500 MG capsule Take 1 capsule (500 mg total) by mouth 3 (three) times daily. 06/08/22   Gardiner Barefoot, MD  bictegravir-emtricitabine-tenofovir AF (BIKTARVY) 50-200-25 MG TABS tablet Take 1 tablet by mouth daily. 08/02/22   Gardiner Barefoot, MD  darunavir-cobicistat (PREZCOBIX) 800-150 MG tablet Swallow whole.  Do NOT crush, break or chew tablets. Take with food. 08/02/22   Comer, Belia Heman, MD  methocarbamol (ROBAXIN) 500 MG tablet Take 1 tablet (500 mg total) by mouth 2 (two) times daily. 05/29/22   Lorre Nick, MD      Allergies    Patient has no known allergies.    Review of Systems   Review of Systems  Respiratory:  Positive for cough.   Cardiovascular:  Positive for chest pain, palpitations and syncope.  Neurological:  Positive for syncope.    Physical Exam Updated Vital Signs BP 109/87   Pulse 81   Temp 98 F (36.7 C) (Oral)   Resp 15   Wt 83 kg   SpO2 99%   BMI 24.82 kg/m  Physical Exam Vitals and nursing note reviewed.  Constitutional:      General: He is not in acute distress.    Appearance: He is well-developed.  HENT:     Head: Normocephalic.     Comments: Small abrasion to the right eyebrow.    Nose: Nose normal.     Mouth/Throat:     Mouth: Mucous membranes are moist.     Pharynx: Oropharynx is clear.  Eyes:     Conjunctiva/sclera: Conjunctivae normal.  Cardiovascular:     Rate and Rhythm: Normal rate and regular rhythm.     Pulses:          Radial pulses are 2+ on the  right side and 2+ on the left side.       Dorsalis pedis pulses are 2+ on the right side and 2+ on the left side.     Heart sounds: No murmur heard. Pulmonary:     Effort: Pulmonary effort is normal. No respiratory distress.     Breath sounds: Normal breath sounds.  Abdominal:     Palpations: Abdomen is soft.     Tenderness: There is no abdominal tenderness.  Musculoskeletal:        General: No swelling.     Cervical back: Normal range of motion and neck supple. No rigidity or tenderness.     Right lower leg: No edema.     Left lower leg: No edema.  Skin:    General: Skin is warm and dry.     Capillary Refill: Capillary refill takes less than 2 seconds.  Neurological:     General: No focal deficit present.     Mental Status: He is alert and oriented to person, place, and time. Mental  status is at baseline.     Cranial Nerves: No cranial nerve deficit.     Sensory: No sensory deficit.     Motor: No weakness.     Coordination: Coordination normal.     Gait: Gait normal.     Deep Tendon Reflexes: Reflexes normal.     Comments: Slight lightheadedness with standing.  Psychiatric:        Mood and Affect: Mood normal.     ED Results / Procedures / Treatments   Labs (all labs ordered are listed, but only abnormal results are displayed) Labs Reviewed  BASIC METABOLIC PANEL - Abnormal; Notable for the following components:      Result Value   Potassium 3.3 (*)    CO2 21 (*)    BUN 5 (*)    Calcium 8.8 (*)    All other components within normal limits  CBC  MAGNESIUM  TROPONIN I (HIGH SENSITIVITY)  TROPONIN I (HIGH SENSITIVITY)    EKG EKG Interpretation  Date/Time:  Monday Sep 04 2022 19:39:43 EDT Ventricular Rate:  85 PR Interval:  136 QRS Duration: 86 QT Interval:  384 QTC Calculation: 456 R Axis:   73 Text Interpretation: Normal sinus rhythm Cannot rule out Anterior infarct , age undetermined ST & T wave abnormality, consider inferolateral ischemia Abnormal ECG When compared with ECG of 28-Dec-2006 17:54, TWI in the anterolateral leads are new Confirmed by Vonita Moss [phone]) on 09/04/2022 9:53:07 PM  Radiology CT Head Wo Contrast  Result Date: 09/04/2022 CLINICAL DATA:  Fall with head trauma. EXAM: CT HEAD WITHOUT CONTRAST TECHNIQUE: Contiguous axial images were obtained from the base of the skull through the vertex without intravenous contrast. RADIATION DOSE REDUCTION: This exam was performed according to the departmental dose-optimization program which includes automated exposure control, adjustment of the mA and/or kV according to patient size and/or use of iterative reconstruction technique. COMPARISON:  CT 06/05/2018 FINDINGS: Brain: There is mild cerebral atrophy and small-vessel disease, but more than expected at this patient's age and somewhat  progressed from 4 years ago. The cerebellum and brainstem remain unremarkable. No focal asymmetry is seen concerning for an acute infarct, hemorrhage or mass. There is no midline shift. There is mild atrophic ventriculomegaly. Basal cisterns are clear. Vascular: No hyperdense vessel or unexpected calcification. Skull: There is no visible scalp hematoma or depressed skull fracture. No skull lesions are seen. Sinuses/Orbits: There is membrane thickening and scattered fluid in the bilateral  ethmoid air cells, membrane thickening in the right greater than left sphenoid sinus and membrane thickening and fluid in both maxillary sinuses. This was not seen previously. There is trace fluid in the left mastoid tip which was seen previously. The bilateral mastoids are otherwise clear. There is mild deviation of the nasal septum to the right. Other: None. IMPRESSION: 1. No acute intracranial CT findings or depressed skull fractures. 2. Atrophy and small-vessel disease, more than expected for age. 3. Sinus disease. Query acute on chronic sinusitis in the ethmoid and maxillary sinuses. Electronically Signed   By: Almira Bar M.D.   On: 09/04/2022 21:47   DG Chest 2 View  Result Date: 09/04/2022 CLINICAL DATA:  Chest pain. EXAM: CHEST - 2 VIEW COMPARISON:  June 09, 2018 FINDINGS: The heart size and mediastinal contours are within normal limits. Both lungs are clear. The visualized skeletal structures are unremarkable. IMPRESSION: No active cardiopulmonary disease. Electronically Signed   By: Ted Mcalpine M.D.   On: 09/04/2022 20:17    Procedures Procedures    Medications Ordered in ED Medications  lactated ringers bolus 1,000 mL (0 mLs Intravenous Stopped 09/04/22 2245)  potassium chloride SA (KLOR-CON M) CR tablet 40 mEq (40 mEq Oral Given 09/04/22 2243)    ED Course/ Medical Decision Making/ A&P Clinical Course as of 09/04/22 2349  Mon Sep 04, 2022  2114 EKG normal sinus rhythm, normal normals,  inferolateral T wave inversion with nonspecific ST abnormality.  No STEMI. [JD]    Clinical Course User Index [JD] Fulton Reek, MD                             Medical Decision Making Amount and/or Complexity of Data Reviewed Labs: ordered. Radiology: ordered.  Risk Prescription drug management.   42 year old male presenting for episode of palpitations, diaphoresis, syncope at home.  Denies any frank chest pain.  Vital signs reviewed.  On exam he is in no acute distress.  He does have some intermittent dizziness and a small abrasion on his forehead from the fall.  Sounds like he had an episode of palpitations followed by syncope.  Differential including ACS, arrhythmia, side effect related to marijuana use or other intoxication.  EKG shows inferolateral T wave inversions, no STEMI.  Obtain delta troponin, chest x-ray, CT head for evaluation given trauma.  EKG reviewed, he does have some inferolateral T wave inversions and nonspecific ST abnormalities, though appears similar to prior.  His troponins are negative x 2, given lack of chest pain and predominant palpitations and low concern for ACS.  BMP is notable for bicarb 21, potassium 3.3.  Oral repletion given as well as IV fluids.  CBC is unremarkable.  CT head reviewed, no acute traumatic abnormality or stroke.  Noted Sinus Disease, However Clinically Is Asymptomatic.  Chest x-ray reviewed, no pneumonia, pneumothorax, mediastinal widening, normal cardiac silhouette  On reevaluation, patient is asymptomatic.  No additional palpitations or syncope.  Given reassuring workup, I think she is appropriate for discharge home.  I gave a cardiology referral for follow-up.  Recommend he follow-up with his PCP as well.  Discharged with strict return precautions.        Final Clinical Impression(s) / ED Diagnoses Final diagnoses:  Syncope, unspecified syncope type    Rx / DC Orders ED Discharge Orders          Ordered    Ambulatory  referral to Cardiology  Comments: If you have not heard from the Cardiology office within the next 72 hours please call [phone].   09/04/22 2242              Fulton Reek, MD 09/04/22 2349    Christopher Baton, MD 09/06/22 [phone]

## 2022-09-07 ENCOUNTER — Ambulatory Visit: Payer: Self-pay | Admitting: Cardiology

## 2022-09-07 NOTE — Progress Notes (Deleted)
Primary Physician/Referring:  [name], PA-C  Patient ID: Christopher Henson, male    DOB: 12-17-1980, 42 y.o.   MRN: [id number]  No chief complaint on file.  HPI:    Christopher Henson  is a 42 y.o.African-American male patient with HIV positive, alcohol abuse, GERD, presented to the emergency room on 09/04/2022, 3 days ago with palpitations and syncope.  Episode preceded smoking cigarette and marijuana followed by diaphoresis, palpitations, dizziness followed by syncope, had mild scraping of his right eyebrow.  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   GERD (gastroesophageal reflux disease)    History of cryptococcal meningitis    Protein calorie malnutrition (HCC)    Thrombocytopenia (HCC) 06/2018   Past Surgical History:  Procedure Laterality Date   LUMBAR PUNCTURE  06/2018   during eval for meningitis    Family History  Problem Relation Age of Onset   Diabetes Mother    Hypertension Mother    Diabetes Father    Hypertension Father    Healthy Sister    Healthy Brother    Healthy Sister    Healthy Sister    Healthy Sister    Healthy Brother    Healthy Brother    Heart disease Neg Hx    Cancer Neg Hx    Stroke Neg Hx     Social History   Tobacco Use   Smoking status: Every Day    Packs/day: 1    Types: Cigarettes   Smokeless tobacco: Never  Substance Use Topics   Alcohol use: Yes    Comment: 1/2 pint on weekend ; liquor   Marital Status: Single  ROS  ***ROS Objective      09/04/2022   10:45 PM 09/04/2022   10:25 PM 09/04/2022    7:39 PM  Vitals with BMI  Weight   183 lbs  Systolic 109    Diastolic 87    Pulse 81 83        ***Physical Exam  Laboratory examination:   Recent Labs    06/08/22 0935 09/04/22 1951  NA 143 140  K 3.7 3.3*  CL 107 106  CO2 28 21*  GLUCOSE 101* 94  BUN 7 5*  CREATININE 1.04 1.24  CALCIUM 9.2  8.8*  GFRNONAA  --  >60    Lab Results  Component Value Date   GLUCOSE 94 09/04/2022   NA 140 09/04/2022   K 3.3 (L) 09/04/2022   CL 106 09/04/2022   CO2 21 (L) 09/04/2022   BUN 5 (L) 09/04/2022   CREATININE 1.24 09/04/2022   GFRNONAA >60 09/04/2022   CALCIUM 8.8 (L) 09/04/2022   PROT 7.0 06/08/2022   ALBUMIN 4.5 02/25/2019   LABGLOB 2.6 02/25/2019   AGRATIO 1.7 02/25/2019   BILITOT 0.6 06/08/2022   ALKPHOS 91 02/25/2019   AST 10 06/08/2022   ALT 9 06/08/2022   ANIONGAP 13 09/04/2022      Lab Results  Component Value Date   ALT 9 06/08/2022   AST 10 06/08/2022   ALKPHOS 91 02/25/2019   BILITOT 0.6 06/08/2022       Latest Ref Rng & Units 06/08/2022    9:35 AM 01/06/2021    3:33 PM 04/13/2020   12:03 PM  Hepatic Function  Total Protein 6.1 - 8.1 g/dL 7.0  7.2  6.5   AST 10 - 40 U/L 10  10  12   ALT 9 - 46 U/L 9  9  14    Total Bilirubin 0.2 - 1.2 mg/dL 0.6  0.6  0.5     Lipid Panel Recent Labs    06/08/22 0935  CHOL 175  TRIG 155*  LDLCALC 106*  HDL 42  CHOLHDL 4.2   Lab Results  Component Value Date   TSH 1.380 02/25/2019   Last vitamin D No results found for: "25OHVITD2", "25OHVITD3", "VD25OH"   Radiology:   CT head 2022-09-19: 1. No acute intracranial CT findings or depressed skull fractures. 2. Atrophy and small-vessel disease, more than expected for age. 3. Sinus disease. Query acute on chronic sinusitis in the ethmoid and maxillary sinuses.  Chest x-ray two-view 2022-09-19: The heart size and mediastinal contours are within normal limits. Both lungs are clear. The visualized skeletal structures are unremarkable  Cardiac Studies:   NA  EKG:   ***    EKG 09-19-22: Normal sinus rhythm at rate of 85 bpm, T wave inversion inferior leads and lateral leads, inferior ischemia, lateral ischemia.  Consider LVH with repolarization abnormality. Medications and allergies  No Known Allergies   Medication list   Current Outpatient Medications:     acetaminophen (TYLENOL) 325 MG tablet, Take 2 tablets (650 mg total) by mouth every 6 (six) hours as needed for mild pain (or Fever >/= 101)., Disp: , Rfl:    amoxicillin (AMOXIL) 500 MG capsule, Take 1 capsule (500 mg total) by mouth 3 (three) times daily., Disp: 42 capsule, Rfl: 0   bictegravir-emtricitabine-tenofovir AF (BIKTARVY) 50-200-25 MG TABS tablet, Take 1 tablet by mouth daily., Disp: 30 tablet, Rfl: 2   darunavir-cobicistat (PREZCOBIX) 800-150 MG tablet, Swallow whole. Do NOT crush, break or chew tablets. Take with food., Disp: 30 tablet, Rfl: 2   methocarbamol (ROBAXIN) 500 MG tablet, Take 1 tablet (500 mg total) by mouth 2 (two) times daily., Disp: 20 tablet, Rfl: 0  Assessment  No diagnosis found.   No orders of the defined types were placed in this encounter.   No orders of the defined types were placed in this encounter.   There are no discontinued medications.   Recommendations:   Christopher Henson is a 42 y.o. African-American male patient with HIV positive, alcohol abuse, GERD, presented to the emergency room on 09/19/22, 3 days ago with palpitations and syncope.     *** Episode preceded smoking cigarette and marijuana followed by diaphoresis, palpitations, dizziness followed by syncope, had mild scraping of his right eyebrow.  ***    Yates Decamp, MD, [facility] 09/07/2022, 7:38 AM Office: [phone]

## 2022-09-14 ENCOUNTER — Ambulatory Visit: Payer: 59 | Admitting: Internal Medicine

## 2022-10-06 ENCOUNTER — Telehealth: Payer: Self-pay

## 2022-10-06 ENCOUNTER — Other Ambulatory Visit (HOSPITAL_COMMUNITY): Payer: Self-pay

## 2022-10-06 NOTE — Telephone Encounter (Signed)
[facility] Patient Advocate Encounter   Was successful in obtaining a [employer] copay card for Prezcobix.  This copay card will make the patients copay $0.00.  I have spoken with the patient.          Langley Flatley, CPhT [facility] Phone: [phone] Fax:  [phone]  

## 2022-11-23 ENCOUNTER — Ambulatory Visit: Payer: 59 | Admitting: Internal Medicine

## 2022-11-27 ENCOUNTER — Telehealth: Payer: Self-pay

## 2022-11-27 ENCOUNTER — Encounter: Payer: 59 | Admitting: Internal Medicine

## 2022-11-27 NOTE — Telephone Encounter (Signed)
Attempted to call patient regarding missed appointment today. Will need to reschedule with pharmacy team due to multiple no shows.  Juanita Laster, RMA

## 2022-12-12 ENCOUNTER — Emergency Department (HOSPITAL_COMMUNITY): Payer: 59

## 2022-12-12 ENCOUNTER — Emergency Department (HOSPITAL_COMMUNITY)
Admission: EM | Admit: 2022-12-12 | Discharge: 2022-12-12 | Discharge status: Against Medical Advice | Payer: 59 | Attending: Emergency Medicine | Admitting: Emergency Medicine

## 2022-12-12 ENCOUNTER — Other Ambulatory Visit: Payer: Self-pay

## 2022-12-12 DIAGNOSIS — R739 Hyperglycemia, unspecified: Secondary | ICD-10-CM | POA: Insufficient documentation

## 2022-12-12 DIAGNOSIS — E876 Hypokalemia: Secondary | ICD-10-CM | POA: Diagnosis not present

## 2022-12-12 DIAGNOSIS — R079 Chest pain, unspecified: Secondary | ICD-10-CM | POA: Diagnosis not present

## 2022-12-12 DIAGNOSIS — R7309 Other abnormal glucose: Secondary | ICD-10-CM

## 2022-12-12 DIAGNOSIS — Z21 Asymptomatic human immunodeficiency virus [HIV] infection status: Secondary | ICD-10-CM | POA: Diagnosis not present

## 2022-12-12 DIAGNOSIS — R918 Other nonspecific abnormal finding of lung field: Secondary | ICD-10-CM | POA: Diagnosis not present

## 2022-12-12 DIAGNOSIS — Z5329 Procedure and treatment not carried out because of patient's decision for other reasons: Secondary | ICD-10-CM | POA: Diagnosis not present

## 2022-12-12 DIAGNOSIS — Z743 Need for continuous supervision: Secondary | ICD-10-CM | POA: Diagnosis not present

## 2022-12-12 DIAGNOSIS — R0789 Other chest pain: Secondary | ICD-10-CM | POA: Diagnosis not present

## 2022-12-12 DIAGNOSIS — J90 Pleural effusion, not elsewhere classified: Secondary | ICD-10-CM | POA: Diagnosis not present

## 2022-12-12 DIAGNOSIS — J9811 Atelectasis: Secondary | ICD-10-CM | POA: Diagnosis not present

## 2022-12-12 LAB — CBC WITH DIFFERENTIAL/PLATELET
Abs Immature Granulocytes: 0.03 10*3/uL (ref 0.00–0.07)
Basophils Absolute: 0.1 10*3/uL (ref 0.0–0.1)
Basophils Relative: 1 %
Eosinophils Absolute: 0.1 10*3/uL (ref 0.0–0.5)
Eosinophils Relative: 1 %
HCT: 43.2 % (ref 39.0–52.0)
Hemoglobin: 15.5 g/dL (ref 13.0–17.0)
Immature Granulocytes: 1 %
Lymphocytes Relative: 27 %
Lymphs Abs: 1.6 10*3/uL (ref 0.7–4.0)
MCH: 32.6 pg (ref 26.0–34.0)
MCHC: 35.9 g/dL (ref 30.0–36.0)
MCV: 90.8 fL (ref 80.0–100.0)
Monocytes Absolute: 0.7 10*3/uL (ref 0.1–1.0)
Monocytes Relative: 12 %
Neutro Abs: 3.6 10*3/uL (ref 1.7–7.7)
Neutrophils Relative %: 58 %
Platelets: 189 10*3/uL (ref 150–400)
RBC: 4.76 MIL/uL (ref 4.22–5.81)
RDW: 13.7 % (ref 11.5–15.5)
WBC: 6.1 10*3/uL (ref 4.0–10.5)
nRBC: 0 % (ref 0.0–0.2)

## 2022-12-12 LAB — COMPREHENSIVE METABOLIC PANEL
ALT: 16 U/L (ref 0–44)
AST: 23 U/L (ref 15–41)
Albumin: 3.3 g/dL — ABNORMAL LOW (ref 3.5–5.0)
Alkaline Phosphatase: 76 U/L (ref 38–126)
Anion gap: 10 (ref 5–15)
BUN: 10 mg/dL (ref 6–20)
CO2: 25 mmol/L (ref 22–32)
Calcium: 8.7 mg/dL — ABNORMAL LOW (ref 8.9–10.3)
Chloride: 103 mmol/L (ref 98–111)
Creatinine, Ser: 1.18 mg/dL (ref 0.61–1.24)
GFR, Estimated: >60 mL/min (ref >60)
Glucose, Bld: 118 mg/dL — ABNORMAL HIGH (ref 70–99)
Potassium: 3.4 mmol/L — ABNORMAL LOW (ref 3.5–5.1)
Sodium: 138 mmol/L (ref 135–145)
Total Bilirubin: 0.4 mg/dL (ref 0.3–1.2)
Total Protein: 6.1 g/dL — ABNORMAL LOW (ref 6.5–8.1)

## 2022-12-12 LAB — TROPONIN I (HIGH SENSITIVITY)
Troponin I (High Sensitivity): 6 ng/L (ref <18)
Troponin I (High Sensitivity): 6 ng/L (ref <18)

## 2022-12-12 LAB — LIPASE, BLOOD: Lipase: 30 U/L (ref 11–51)

## 2022-12-12 MED ORDER — ALUM & MAG HYDROXIDE-SIMETH 200-200-20 MG/5ML PO SUSP
30.0000 mL | Freq: Once | ORAL | Status: AC
  Administered 2022-12-12: 30 mL via ORAL
  Filled 2022-12-12: qty 30

## 2022-12-12 MED ORDER — IOHEXOL 350 MG/ML SOLN
50.0000 mL | Freq: Once | INTRAVENOUS | Status: AC | PRN
  Administered 2022-12-12: 50 mL via INTRAVENOUS

## 2022-12-12 MED ORDER — ASPIRIN 81 MG PO CHEW: 324.0000 mg | CHEWABLE_TABLET | Freq: Once | ORAL | Status: DC

## 2022-12-12 MED ORDER — POTASSIUM CHLORIDE CRYS ER 20 MEQ PO TBCR
40.0000 meq | EXTENDED_RELEASE_TABLET | Freq: Once | ORAL | Status: AC
  Administered 2022-12-12: 40 meq via ORAL
  Filled 2022-12-12: qty 2

## 2022-12-12 MED ORDER — PANTOPRAZOLE SODIUM 40 MG IV SOLR
40.0000 mg | Freq: Once | INTRAVENOUS | Status: AC
  Administered 2022-12-12: 40 mg via INTRAVENOUS
  Filled 2022-12-12: qty 10

## 2022-12-12 NOTE — ED Provider Notes (Signed)
Patient signed out to me by previous provider. Please refer to their note for full HPI.  Briefly this is a 42 year old male with atypical chest pain who presented to the previous provider.  Initial cardiac workup was negative.  He was pending repeat troponin and CT of the chest for a possible new mass found on the chest x-ray at time of signout.  Patient decided to leave AGAINST MEDICAL ADVICE prior to my evaluation while I was treating an active CPR patient.  He was encouraged by staff that he wait for me to evaluate him and review results.  However patient decided to leave AGAINST MEDICAL ADVICE and signed the form.  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  Attempted to reach the patient and provided phone number without success.  Patient left AMA prior to my reevaluation and result review, reportedly walked out with a steady gait.   Rozelle Logan, [place] 12/12/22 9147

## 2022-12-12 NOTE — ED Provider Notes (Addendum)
[place] EMERGENCY DEPARTMENT AT [facility] Provider Note   CSN: [id number] Arrival date & time: 12/12/22  0425     History  Chief Complaint  Patient presents with   Chest Pain    Patient to ED via EMS with complaint of chest pain x 4 hours radiating into left shoulder and left neck. EMS reports giving patient 4 SL nitroglycerin, 324 asa po and 6 mg morphine IV    Christopher Henson is a 42 y.o. male.  The history is provided by the patient.  He has history of GERD, HIV infection and comes in because of chest pain which started about 3 hours ago.  It did not wake him up.  He describes a sharp pain in the midsternal area with radiation to the back and left shoulder.  There was associated dyspnea and diaphoresis as well as a sense of needing to defecate but he denies nausea or vomiting.  Symptoms are improving but are still present.  He did note that pain was worse when he lay flat, better if he sat up.  He is a cigarette smoker admitting to smoking 1 pack of cigarettes a day as well as smoking marijuana.  He denies history of hypertension, diabetes, hyperlipidemia.  There is a family history of premature coronary atherosclerosis.   Home Medications Prior to Admission medications   Medication Sig Start Date End Date Taking? Authorizing Provider  acetaminophen (TYLENOL) 325 MG tablet Take 2 tablets (650 mg total) by mouth every 6 (six) hours as needed for mild pain (or Fever >/= 101). 06/21/18   Elgergawy, Leana Roe, MD  amoxicillin (AMOXIL) 500 MG capsule Take 1 capsule (500 mg total) by mouth 3 (three) times daily. 06/08/22   Gardiner Barefoot, MD  bictegravir-emtricitabine-tenofovir AF (BIKTARVY) 50-200-25 MG TABS tablet Take 1 tablet by mouth daily. 08/02/22   Gardiner Barefoot, MD  darunavir-cobicistat (PREZCOBIX) 800-150 MG tablet Swallow whole. Do NOT crush, break or chew tablets. Take with food. 08/02/22   Comer, Belia Heman, MD  methocarbamol (ROBAXIN) 500 MG tablet Take 1 tablet (500 mg  total) by mouth 2 (two) times daily. 05/29/22   Lorre Nick, MD      Allergies    Patient has no known allergies.    Review of Systems   Review of Systems  All other systems reviewed and are negative.   Physical Exam Updated Vital Signs BP 113/72   Pulse 88   Temp 98.6 F (37 C) (Oral)   Resp 19   Ht 6' (1.829 m)   Wt 83 kg   SpO2 96%   BMI 24.82 kg/m  Physical Exam Vitals and nursing note reviewed.   42 year old male, resting comfortably and in no acute distress. Vital signs are normal. Oxygen saturation is 96%, which is normal. Head is normocephalic and atraumatic. PERRLA, EOMI. Oropharynx is clear. Neck is nontender and supple without adenopathy or JVD. Back is nontender and there is no CVA tenderness. Lungs are clear without rales, wheezes, or rhonchi. Chest is nontender. Heart has regular rate and rhythm without murmur. Abdomen is soft, flat, nontender. Extremities have no cyanosis or edema, full range of motion is present. Skin is warm and dry without rash. Neurologic: Mental status is normal, cranial nerves are intact, moves all extremities equally.  ED Results / Procedures / Treatments   Labs (all labs ordered are listed, but only abnormal results are displayed) Labs Reviewed  COMPREHENSIVE METABOLIC PANEL - Abnormal; Notable for the following  components:      Result Value   Potassium 3.4 (*)    Glucose, Bld 118 (*)    Calcium 8.7 (*)    Total Protein 6.1 (*)    Albumin 3.3 (*)    All other components within normal limits  CBC WITH DIFFERENTIAL/PLATELET  LIPASE, BLOOD  TROPONIN I (HIGH SENSITIVITY)  TROPONIN I (HIGH SENSITIVITY)    EKG EKG Interpretation Date/Time:  Tuesday December 12 2022 04:29:43 EDT Ventricular Rate:  86 PR Interval:  135 QRS Duration:  93 QT Interval:  386 QTC Calculation: 462 R Axis:   70  Text Interpretation: Sinus rhythm Normal ECG When compared with ECG of 09/04/2022, T wave inversion Inferior leads is no longer  present Confirmed by Dione Booze ([id number]) on 12/12/2022 4:39:21 AM  Radiology DG Chest 2 View  Result Date: 12/12/2022 CLINICAL DATA:  Chest pain radiating to the shoulder and neck. EXAM: CHEST - 2 VIEW COMPARISON:  09/04/2022 FINDINGS: Large masslike appearance over the left mediastinum which appears distinct from the aortic knob. There is left lower lobe pulmonary opacification and small pleural effusion. Normal heart size. Artifact from EKG leads. IMPRESSION: Left perihilar mass since radiograph earlier this year with lower lobe opacification and small pleural effusion. Recommend enhanced CT. Electronically Signed   By: Tiburcio Pea M.D.   On: 12/12/2022 05:05    Procedures Procedures  Cardiac monitor shows normal sinus rhythm, per my interpretation.  Medications Ordered in ED Medications  potassium chloride SA (KLOR-CON M) CR tablet 40 mEq (has no administration in time range)  alum & mag hydroxide-simeth (MAALOX/MYLANTA) 200-200-20 MG/5ML suspension 30 mL (30 mLs Oral Given 12/12/22 0505)  pantoprazole (PROTONIX) injection 40 mg (40 mg Intravenous Given 12/12/22 9323)    ED Course/ Medical Decision Making/ A&P             HEART Score: 1                    Medical Decision Making Amount and/or Complexity of Data Reviewed Labs: ordered. Radiology: ordered.  Risk OTC drugs. Prescription drug management.   Chest pain of uncertain cause.  Differential diagnosis includes ACS, GERD, pulmonary embolism, pneumonia, pericarditis, peptic ulcer disease, pancreatitis, cholecystitis.  This differential includes conditions with significant risk for complications and morbidity.  I have reviewed and interpreted his electrocardiogram and my interpretation is normal ECG with improvement in T wave inversion in the inferior and anterolateral leads noted on prior ECG.  I have ordered laboratory workup of CBC, comprehensive metabolic panel, lipase, troponin x 2 and I have ordered a chest x-ray.  I have  ordered a dose of oral aspirin as well as an antiacid.  I have reviewed his laboratory results, my interpretation is normal troponin, mildly elevated random glucose which will need to be followed as an outpatient, mild hypokalemia and I have ordered a dose of oral potassium, normal CBC.  Heart score is 1, which puts him at low risk for major adverse cardiac events in the next 6 weeks.  I have reviewed his past records, and he was seen in the emergency department on 09/04/2022 for syncope.  Chest x-ray shows left hilar mass with recommendation for CT of chest with contrast.  I have ordered this.  Patient did have significant improvement with above-noted treatment.  CT chest and repeat troponin are pending.  Case is signed out to Dr. Wilkie Aye.  Final Clinical Impression(s) / ED Diagnoses Final diagnoses:  Nonspecific chest pain  Elevated random  blood glucose level  Hypokalemia  Hilar mass  Asymptomatic HIV infection, with no history of HIV-related illness [facility])    Rx / DC Orders ED Discharge Orders     None         Dione Booze, MD 12/12/22 0626    Dione Booze, MD 12/12/22 [phone]

## 2022-12-12 NOTE — ED Notes (Signed)
Pt request IV discontinued, voiced to pt that IV will be removed when discharged. Voiced to pt that doctor will come in and review results with pt and should medication be ordered IV, this nurse will have to insert new IV. Pt continue to request removal of IV, and request to leave. Dr Wilkie Aye aware and stated that pt can leave AMA.

## 2022-12-14 ENCOUNTER — Other Ambulatory Visit: Payer: 59

## 2022-12-14 ENCOUNTER — Ambulatory Visit: Payer: 59 | Admitting: Family

## 2022-12-14 ENCOUNTER — Other Ambulatory Visit: Payer: Self-pay

## 2022-12-14 ENCOUNTER — Other Ambulatory Visit (HOSPITAL_COMMUNITY)
Admission: RE | Admit: 2022-12-14 | Discharge: 2022-12-14 | Disposition: A | Discharge status: Home / Self Care | Payer: 59 | Source: Ambulatory Visit | Attending: Family | Admitting: Family

## 2022-12-14 DIAGNOSIS — Z113 Encounter for screening for infections with a predominantly sexual mode of transmission: Secondary | ICD-10-CM

## 2022-12-14 DIAGNOSIS — B20 Human immunodeficiency virus [HIV] disease: Secondary | ICD-10-CM

## 2022-12-14 DIAGNOSIS — Z79899 Other long term (current) drug therapy: Secondary | ICD-10-CM

## 2022-12-14 MED ORDER — BIKTARVY 50-200-25 MG PO TABS
1.0000 | ORAL_TABLET | Freq: Every day | ORAL | 0 refills | Status: DC
Start: 2022-12-14 — End: 2022-12-18

## 2022-12-14 MED ORDER — PREZCOBIX 800-150 MG PO TABS: ORAL_TABLET | ORAL | 0 refills | Status: DC

## 2022-12-15 LAB — URINE CYTOLOGY ANCILLARY ONLY
Chlamydia: NEGATIVE
Comment: NEGATIVE
Comment: NORMAL
Neisseria Gonorrhea: NEGATIVE

## 2022-12-15 LAB — T-HELPER CELL (CD4) - (RCID CLINIC ONLY)
CD4 % Helper T Cell: 18 % — ABNORMAL LOW (ref 33–65)
CD4 T Cell Abs: 258 /uL — ABNORMAL LOW (ref 400–1790)

## 2022-12-16 LAB — COMPLETE METABOLIC PANEL WITH GFR
AG Ratio: 1.7 (calc) (ref 1.0–2.5)
ALT: 13 U/L (ref 9–46)
AST: 19 U/L (ref 10–40)
Albumin: 4.1 g/dL (ref 3.6–5.1)
Alkaline phosphatase (APISO): 88 U/L (ref 36–130)
BUN: 7 mg/dL (ref 7–25)
CO2: 27 mmol/L (ref 20–32)
Calcium: 9.4 mg/dL (ref 8.6–10.3)
Chloride: 105 mmol/L (ref 98–110)
Creat: 0.89 mg/dL (ref 0.60–1.29)
Globulin: 2.4 g/dL (ref 1.9–3.7)
Glucose, Bld: 84 mg/dL (ref 65–99)
Potassium: 3.8 mmol/L (ref 3.5–5.3)
Sodium: 141 mmol/L (ref 135–146)
Total Bilirubin: 0.5 mg/dL (ref 0.2–1.2)
Total Protein: 6.5 g/dL (ref 6.1–8.1)
eGFR: 108 mL/min/{1.73_m2} (ref >=60)

## 2022-12-16 LAB — HIV-1 RNA QUANT-NO REFLEX-BLD
HIV 1 RNA Quant: 89 {copies}/mL — ABNORMAL HIGH
HIV-1 RNA Quant, Log: 1.95 {Log_copies}/mL — ABNORMAL HIGH

## 2022-12-16 LAB — LIPID PANEL
Cholesterol: 166 mg/dL (ref <200)
HDL: 47 mg/dL (ref >=40)
LDL Cholesterol (Calc): 95 mg/dL
Non-HDL Cholesterol (Calc): 119 mg/dL (ref <130)
Total CHOL/HDL Ratio: 3.5 (calc) (ref <5.0)
Triglycerides: 142 mg/dL (ref <150)

## 2022-12-16 LAB — RPR: RPR Ser Ql: NONREACTIVE

## 2022-12-16 LAB — CBC WITH DIFFERENTIAL/PLATELET
Absolute Monocytes: 598 {cells}/uL (ref 200–950)
Basophils Absolute: 61 {cells}/uL (ref 0–200)
Basophils Relative: 0.9 %
Eosinophils Absolute: 82 {cells}/uL (ref 15–500)
Eosinophils Relative: 1.2 %
HCT: 48.1 % (ref 38.5–50.0)
Hemoglobin: 16.4 g/dL (ref 13.2–17.1)
Lymphs Abs: 1584 {cells}/uL (ref 850–3900)
MCH: 32 pg (ref 27.0–33.0)
MCHC: 34.1 g/dL (ref 32.0–36.0)
MCV: 93.8 fL (ref 80.0–100.0)
MPV: 11.1 fL (ref 7.5–12.5)
Monocytes Relative: 8.8 %
Neutro Abs: 4474 {cells}/uL (ref 1500–7800)
Neutrophils Relative %: 65.8 %
Platelets: 209 10*3/uL (ref 140–400)
RBC: 5.13 10*6/uL (ref 4.20–5.80)
RDW: 13.3 % (ref 11.0–15.0)
Total Lymphocyte: 23.3 %
WBC: 6.8 10*3/uL (ref 3.8–10.8)

## 2022-12-18 ENCOUNTER — Emergency Department (HOSPITAL_COMMUNITY): Payer: 59

## 2022-12-18 ENCOUNTER — Other Ambulatory Visit: Payer: Self-pay

## 2022-12-18 ENCOUNTER — Encounter (HOSPITAL_COMMUNITY): Payer: Self-pay | Admitting: *Deleted

## 2022-12-18 ENCOUNTER — Inpatient Hospital Stay (HOSPITAL_COMMUNITY)
Admission: EM | Admit: 2022-12-18 | Discharge: 2023-01-03 | DRG: 829 | Disposition: A | Discharge status: Home / Self Care | Payer: 59 | Attending: Family Medicine | Admitting: Family Medicine

## 2022-12-18 ENCOUNTER — Inpatient Hospital Stay (HOSPITAL_COMMUNITY): Payer: 59

## 2022-12-18 DIAGNOSIS — J90 Pleural effusion, not elsewhere classified: Secondary | ICD-10-CM | POA: Diagnosis present

## 2022-12-18 DIAGNOSIS — Z833 Family history of diabetes mellitus: Secondary | ICD-10-CM

## 2022-12-18 DIAGNOSIS — F32A Depression, unspecified: Secondary | ICD-10-CM | POA: Diagnosis not present

## 2022-12-18 DIAGNOSIS — R918 Other nonspecific abnormal finding of lung field: Secondary | ICD-10-CM | POA: Diagnosis not present

## 2022-12-18 DIAGNOSIS — K5900 Constipation, unspecified: Secondary | ICD-10-CM | POA: Diagnosis present

## 2022-12-18 DIAGNOSIS — B451 Cerebral cryptococcosis: Secondary | ICD-10-CM

## 2022-12-18 DIAGNOSIS — F17200 Nicotine dependence, unspecified, uncomplicated: Secondary | ICD-10-CM | POA: Diagnosis not present

## 2022-12-18 DIAGNOSIS — R0602 Shortness of breath: Secondary | ICD-10-CM | POA: Diagnosis not present

## 2022-12-18 DIAGNOSIS — Z9889 Other specified postprocedural states: Secondary | ICD-10-CM

## 2022-12-18 DIAGNOSIS — B20 Human immunodeficiency virus [HIV] disease: Secondary | ICD-10-CM | POA: Diagnosis not present

## 2022-12-18 DIAGNOSIS — E876 Hypokalemia: Secondary | ICD-10-CM | POA: Diagnosis not present

## 2022-12-18 DIAGNOSIS — Z5901 Sheltered homelessness: Secondary | ICD-10-CM

## 2022-12-18 DIAGNOSIS — K7689 Other specified diseases of liver: Secondary | ICD-10-CM | POA: Diagnosis not present

## 2022-12-18 DIAGNOSIS — J9601 Acute respiratory failure with hypoxia: Secondary | ICD-10-CM | POA: Diagnosis present

## 2022-12-18 DIAGNOSIS — F1020 Alcohol dependence, uncomplicated: Secondary | ICD-10-CM | POA: Diagnosis not present

## 2022-12-18 DIAGNOSIS — G039 Meningitis, unspecified: Secondary | ICD-10-CM | POA: Diagnosis not present

## 2022-12-18 DIAGNOSIS — T375X6A Underdosing of antiviral drugs, initial encounter: Secondary | ICD-10-CM | POA: Diagnosis present

## 2022-12-18 DIAGNOSIS — C7A1 Malignant poorly differentiated neuroendocrine tumors: Secondary | ICD-10-CM | POA: Diagnosis not present

## 2022-12-18 DIAGNOSIS — R9082 White matter disease, unspecified: Secondary | ICD-10-CM | POA: Diagnosis not present

## 2022-12-18 DIAGNOSIS — C799 Secondary malignant neoplasm of unspecified site: Secondary | ICD-10-CM | POA: Diagnosis not present

## 2022-12-18 DIAGNOSIS — Z91120 Patient's intentional underdosing of medication regimen due to financial hardship: Secondary | ICD-10-CM

## 2022-12-18 DIAGNOSIS — J910 Malignant pleural effusion: Secondary | ICD-10-CM | POA: Diagnosis present

## 2022-12-18 DIAGNOSIS — R590 Localized enlarged lymph nodes: Secondary | ICD-10-CM | POA: Diagnosis not present

## 2022-12-18 DIAGNOSIS — Z8249 Family history of ischemic heart disease and other diseases of the circulatory system: Secondary | ICD-10-CM | POA: Diagnosis not present

## 2022-12-18 DIAGNOSIS — F17210 Nicotine dependence, cigarettes, uncomplicated: Secondary | ICD-10-CM | POA: Diagnosis present

## 2022-12-18 DIAGNOSIS — R0600 Dyspnea, unspecified: Secondary | ICD-10-CM | POA: Diagnosis not present

## 2022-12-18 DIAGNOSIS — F419 Anxiety disorder, unspecified: Secondary | ICD-10-CM | POA: Diagnosis present

## 2022-12-18 DIAGNOSIS — K219 Gastro-esophageal reflux disease without esophagitis: Secondary | ICD-10-CM | POA: Diagnosis not present

## 2022-12-18 DIAGNOSIS — Z0389 Encounter for observation for other suspected diseases and conditions ruled out: Secondary | ICD-10-CM | POA: Diagnosis not present

## 2022-12-18 DIAGNOSIS — R079 Chest pain, unspecified: Secondary | ICD-10-CM | POA: Diagnosis not present

## 2022-12-18 DIAGNOSIS — R0789 Other chest pain: Secondary | ICD-10-CM | POA: Diagnosis not present

## 2022-12-18 DIAGNOSIS — J9859 Other diseases of mediastinum, not elsewhere classified: Principal | ICD-10-CM | POA: Insufficient documentation

## 2022-12-18 DIAGNOSIS — F1021 Alcohol dependence, in remission: Secondary | ICD-10-CM | POA: Diagnosis present

## 2022-12-18 DIAGNOSIS — I889 Nonspecific lymphadenitis, unspecified: Secondary | ICD-10-CM | POA: Diagnosis not present

## 2022-12-18 DIAGNOSIS — C3492 Malignant neoplasm of unspecified part of left bronchus or lung: Secondary | ICD-10-CM

## 2022-12-18 DIAGNOSIS — C3490 Malignant neoplasm of unspecified part of unspecified bronchus or lung: Secondary | ICD-10-CM | POA: Diagnosis not present

## 2022-12-18 DIAGNOSIS — R059 Cough, unspecified: Secondary | ICD-10-CM | POA: Diagnosis not present

## 2022-12-18 DIAGNOSIS — Z48813 Encounter for surgical aftercare following surgery on the respiratory system: Secondary | ICD-10-CM | POA: Diagnosis not present

## 2022-12-18 DIAGNOSIS — Z1152 Encounter for screening for COVID-19: Secondary | ICD-10-CM

## 2022-12-18 DIAGNOSIS — Z452 Encounter for adjustment and management of vascular access device: Secondary | ICD-10-CM | POA: Diagnosis not present

## 2022-12-18 DIAGNOSIS — R911 Solitary pulmonary nodule: Secondary | ICD-10-CM | POA: Diagnosis not present

## 2022-12-18 DIAGNOSIS — Z21 Asymptomatic human immunodeficiency virus [HIV] infection status: Secondary | ICD-10-CM | POA: Diagnosis present

## 2022-12-18 LAB — URINALYSIS, W/ REFLEX TO CULTURE (INFECTION SUSPECTED)
Bacteria, UA: NONE SEEN
Bilirubin Urine: NEGATIVE
Glucose, UA: NEGATIVE mg/dL
Hgb urine dipstick: NEGATIVE
Ketones, ur: NEGATIVE mg/dL
Leukocytes,Ua: NEGATIVE
Nitrite: NEGATIVE
Protein, ur: NEGATIVE mg/dL
Specific Gravity, Urine: 1.025 (ref 1.005–1.030)
pH: 7 (ref 5.0–8.0)

## 2022-12-18 LAB — COMPREHENSIVE METABOLIC PANEL WITH GFR
ALT: 19 U/L (ref 0–44)
AST: 24 U/L (ref 15–41)
Albumin: 3.7 g/dL (ref 3.5–5.0)
Alkaline Phosphatase: 77 U/L (ref 38–126)
Anion gap: 9 (ref 5–15)
BUN: 12 mg/dL (ref 6–20)
CO2: 24 mmol/L (ref 22–32)
Calcium: 8.5 mg/dL — ABNORMAL LOW (ref 8.9–10.3)
Chloride: 103 mmol/L (ref 98–111)
Creatinine, Ser: 1.06 mg/dL (ref 0.61–1.24)
GFR, Estimated: >60 mL/min
Glucose, Bld: 133 mg/dL — ABNORMAL HIGH (ref 70–99)
Potassium: 3.1 mmol/L — ABNORMAL LOW (ref 3.5–5.1)
Sodium: 136 mmol/L (ref 135–145)
Total Bilirubin: 1 mg/dL (ref 0.3–1.2)
Total Protein: 6.9 g/dL (ref 6.5–8.1)

## 2022-12-18 LAB — CBC
HCT: 45.3 % (ref 39.0–52.0)
Hemoglobin: 16.3 g/dL (ref 13.0–17.0)
MCH: 32.3 pg (ref 26.0–34.0)
MCHC: 36 g/dL (ref 30.0–36.0)
MCV: 89.7 fL (ref 80.0–100.0)
Platelets: 220 10*3/uL (ref 150–400)
RBC: 5.05 MIL/uL (ref 4.22–5.81)
RDW: 14.1 % (ref 11.5–15.5)
WBC: 11.2 10*3/uL — ABNORMAL HIGH (ref 4.0–10.5)
nRBC: 0 % (ref 0.0–0.2)

## 2022-12-18 LAB — BODY FLUID CELL COUNT WITH DIFFERENTIAL
Lymphs, Fluid: 69 %
Monocyte-Macrophage-Serous Fluid: 10 % — ABNORMAL LOW (ref 50–90)
Neutrophil Count, Fluid: 21 % (ref 0–25)
Total Nucleated Cell Count, Fluid: 3365 uL — ABNORMAL HIGH (ref 0–1000)

## 2022-12-18 LAB — APTT: aPTT: 24 s (ref 24–36)

## 2022-12-18 LAB — I-STAT CHEM 8, ED
BUN: 12 mg/dL (ref 6–20)
Calcium, Ion: 1.01 mmol/L — ABNORMAL LOW (ref 1.15–1.40)
Chloride: 105 mmol/L (ref 98–111)
Creatinine, Ser: 1.2 mg/dL (ref 0.61–1.24)
Glucose, Bld: 123 mg/dL — ABNORMAL HIGH (ref 70–99)
HCT: 49 % (ref 39.0–52.0)
Hemoglobin: 16.7 g/dL (ref 13.0–17.0)
Potassium: 3.4 mmol/L — ABNORMAL LOW (ref 3.5–5.1)
Sodium: 139 mmol/L (ref 135–145)
TCO2: 23 mmol/L (ref 22–32)

## 2022-12-18 LAB — TROPONIN I (HIGH SENSITIVITY)
Troponin I (High Sensitivity): 7 ng/L
Troponin I (High Sensitivity): 6 ng/L (ref <18)

## 2022-12-18 LAB — I-STAT CG4 LACTIC ACID, ED
Lactic Acid, Venous: 2.7 mmol/L (ref 0.5–1.9)
Lactic Acid, Venous: 1.7 mmol/L (ref 0.5–1.9)

## 2022-12-18 LAB — PROTIME-INR
INR: 1 (ref 0.8–1.2)
Prothrombin Time: 13.8 s (ref 11.4–15.2)

## 2022-12-18 LAB — LACTATE DEHYDROGENASE, PLEURAL OR PERITONEAL FLUID: LD, Fluid: 2453 U/L — ABNORMAL HIGH (ref 3–23)

## 2022-12-18 LAB — PROTEIN, PLEURAL OR PERITONEAL FLUID: Total protein, fluid: 4.4 g/dL

## 2022-12-18 LAB — RESP PANEL BY RT-PCR (RSV, FLU A&B, COVID)  RVPGX2
Influenza A by PCR: NEGATIVE
Influenza B by PCR: NEGATIVE
Resp Syncytial Virus by PCR: NEGATIVE
SARS Coronavirus 2 by RT PCR: NEGATIVE

## 2022-12-18 LAB — GLUCOSE, PLEURAL OR PERITONEAL FLUID: Glucose, Fluid: 105 mg/dL

## 2022-12-18 LAB — PROTEIN, TOTAL: Total Protein: 6.7 g/dL (ref 6.5–8.1)

## 2022-12-18 LAB — LACTATE DEHYDROGENASE: LDH: 598 U/L — ABNORMAL HIGH (ref 98–192)

## 2022-12-18 LAB — CHOLESTEROL, TOTAL: Cholesterol: 171 mg/dL (ref 0–200)

## 2022-12-18 MED ORDER — ACETAMINOPHEN 650 MG RE SUPP: 650.0000 mg | Freq: Four times a day (QID) | RECTAL | Status: DC | PRN

## 2022-12-18 MED ORDER — IOHEXOL 350 MG/ML SOLN
100.0000 mL | Freq: Once | INTRAVENOUS | Status: AC | PRN
  Administered 2022-12-18: 100 mL via INTRAVENOUS

## 2022-12-18 MED ORDER — IPRATROPIUM-ALBUTEROL 0.5-2.5 (3) MG/3ML IN SOLN: 3.0000 mL | RESPIRATORY_TRACT | Status: DC | PRN

## 2022-12-18 MED ORDER — HYDROMORPHONE HCL 1 MG/ML IJ SOLN
0.5000 mg | Freq: Once | INTRAMUSCULAR | Status: AC
  Administered 2022-12-18: 0.5 mg via INTRAVENOUS
  Filled 2022-12-18 (×2): qty 1

## 2022-12-18 MED ORDER — SODIUM CHLORIDE 0.9 % IV SOLN
3.0000 g | Freq: Four times a day (QID) | INTRAVENOUS | Status: DC
  Administered 2022-12-18 – 2022-12-24 (×22): 3 g via INTRAVENOUS
  Filled 2022-12-18 (×24): qty 8

## 2022-12-18 MED ORDER — BICTEGRAVIR-EMTRICITAB-TENOFOV 50-200-25 MG PO TABS
1.0000 | ORAL_TABLET | Freq: Every day | ORAL | Status: DC
  Administered 2022-12-18 – 2023-01-03 (×17): 1 via ORAL
  Filled 2022-12-18 (×17): qty 1

## 2022-12-18 MED ORDER — LACTATED RINGERS IV BOLUS (SEPSIS)
250.0000 mL | Freq: Once | INTRAVENOUS | Status: AC
  Administered 2022-12-18: 250 mL via INTRAVENOUS

## 2022-12-18 MED ORDER — ONDANSETRON HCL 4 MG/2ML IJ SOLN
4.0000 mg | Freq: Four times a day (QID) | INTRAMUSCULAR | Status: DC | PRN
  Administered 2022-12-27 – 2022-12-30 (×2): 4 mg via INTRAVENOUS
  Filled 2022-12-18: qty 2

## 2022-12-18 MED ORDER — LACTATED RINGERS IV SOLN: INTRAVENOUS | Status: DC

## 2022-12-18 MED ORDER — POTASSIUM CHLORIDE IN NACL 20-0.9 MEQ/L-% IV SOLN
INTRAVENOUS | Status: DC
  Filled 2022-12-18 (×3): qty 1000

## 2022-12-18 MED ORDER — POTASSIUM CHLORIDE CRYS ER 20 MEQ PO TBCR
40.0000 meq | EXTENDED_RELEASE_TABLET | Freq: Once | ORAL | Status: AC
  Administered 2022-12-18: 40 meq via ORAL
  Filled 2022-12-18: qty 2

## 2022-12-18 MED ORDER — FENTANYL CITRATE PF 50 MCG/ML IJ SOSY
50.0000 ug | PREFILLED_SYRINGE | Freq: Once | INTRAMUSCULAR | Status: AC
  Administered 2022-12-18: 50 ug via INTRAVENOUS
  Filled 2022-12-18: qty 1

## 2022-12-18 MED ORDER — MAGNESIUM SULFATE 2 GM/50ML IV SOLN
2.0000 g | Freq: Once | INTRAVENOUS | Status: AC
  Administered 2022-12-18: 2 g via INTRAVENOUS
  Filled 2022-12-18: qty 50

## 2022-12-18 MED ORDER — ONDANSETRON HCL 4 MG PO TABS: 4.0000 mg | ORAL_TABLET | Freq: Four times a day (QID) | ORAL | Status: DC | PRN

## 2022-12-18 MED ORDER — LACTATED RINGERS IV BOLUS (SEPSIS)
1000.0000 mL | Freq: Once | INTRAVENOUS | Status: AC
  Administered 2022-12-18: 1000 mL via INTRAVENOUS

## 2022-12-18 MED ORDER — SULFAMETHOXAZOLE-TRIMETHOPRIM 400-80 MG/5ML IV SOLN
415.0000 mg | Freq: Four times a day (QID) | INTRAVENOUS | Status: DC
  Administered 2022-12-18: 415 mg via INTRAVENOUS
  Filled 2022-12-18 (×3): qty 25.94

## 2022-12-18 MED ORDER — ENOXAPARIN SODIUM 40 MG/0.4ML IJ SOSY: 40.0000 mg | PREFILLED_SYRINGE | INTRAMUSCULAR | Status: DC

## 2022-12-18 MED ORDER — ACETAMINOPHEN 325 MG PO TABS
650.0000 mg | ORAL_TABLET | Freq: Four times a day (QID) | ORAL | Status: DC | PRN
  Administered 2022-12-19 – 2022-12-22 (×7): 650 mg via ORAL
  Filled 2022-12-18 (×7): qty 2

## 2022-12-18 NOTE — ED Triage Notes (Signed)
Pt c/o upper chest pain onset a couple of hours ago with mild sob. Reports just lying in the bed when pain started. Pain radiates to shoulder and back of neck

## 2022-12-18 NOTE — ED Notes (Signed)
Christopher Henson from ultrasound called stating cytology needs more fluid from thoracentesis, RN to walk remaining fluid to lab

## 2022-12-18 NOTE — Consult Note (Addendum)
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 and prior history of heavy alcohol abuse  Plan:  Discontinue Bactrim and place patient on Unasyn in case there is some component of more typical bacterial infection in context of his mediastinal mass and potential obstructive pathology Follow-up pleural fluid studies I would continue BIKTARVY alone without the addition of Prezcobix due to the problems of drug-drug interactions in particular in view of the fact that he will likely need chemotherapy soon I would also strongly favor him having a bronchoscopy as an inpatient, given his history of having left AGAINST MEDICAL ADVICE and intermittent adherence to treatment and to clinic appointments though if it requires a PET scan that cannot be obtained as an inpatient we will need to have him ultimately moving to the outpatient world and have a stat PET scan as soon as possible. Follow-up pleural fluid studies though they are rarely diagnostic and these scenarios I will order cryptococcal antigen and histoplasma antigen and urine as well as Blastomyces antigen though these will all be undoubtably negative  Dr. Luciana Henson is covering tomorrow and Thursday and from over the patient having seen him at least 2-3 times in our clinic  Principal Problem:   Mediastinal mass Active Problems:   HIV (human immunodeficiency virus infection) (HCC)   Pleural effusion on left   Hypokalemia   Scheduled Meds:   enoxaparin (LOVENOX) injection  40 mg Subcutaneous Q24H   Continuous Infusions:  0.9 % NaCl with KCl 20 mEq / L 100 mL/hr at 12/18/22 1217   ampicillin-sulbactam (UNASYN) IV Stopped (12/18/22 1849)   PRN Meds:.acetaminophen **OR** acetaminophen, ipratropium-albuterol, ondansetron **OR** ondansetron (ZOFRAN) IV  HPI: Christopher Henson is a 42 y.o. 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  He was recently seen in the ER on 3 September with chest pain and CT scan of the chest showed an 8.3 x 7.9 cm anterior mediastinal mass which extends in the left hilar region and is concerning for malignancy.  He also had evidence of a left pleural effusion which was felt to be small on imaging at the time.  He ended up leaving AGAINST MEDICAL ADVICE.  Returned on 9 September again with complaints of chest pain.  Now he was scanned with a CT angiogram which failed to show evidence of a pulmonary embolism but did of course showed a 8 x 7.5 anterior mediastinal mass extending the left hilar region.  There is associated marked narrowing due to external mass effect in the left upper lobe pulmonary artery there is interval increase in the peribronchial vascular groundglass airspace opacities seen in the left upper lobe with a left upper lobe pulmonary nodule 1.6 x 1.2 cm  and increase in the small size of his left pleural effusion.  I was consulted initially due to concerns about PCP pneumonia but the patient CD4 count has been above 250 and his viral load when last measured this month was 89 copies per milliliter despite him not being as adherent to therapy as he knows he should  be.  I do not think that this is a PCP pneumonia I think his pathology is likely being driven by malignancy versus less likely infection.  He certainly could have a component of superimposed bacterial infection but PCP in my view is out of the picture.  When I came to examine interview the patient he was undergoing thoracentesis by pulmonary critical care medicine who are planning on bronchoscopy with BAL and biopsies--though this is beign considered for outpatient world--I think it would be wise given  his history of coming in and out of care and having left AMA that this should be done as an INPATIENT though PET scan is also desired and due to the inanities of our health care system this may not be something that we can obtain in the inpatient world.  I DO NOT believe he needs Prezcobix with ONLY a 184V mutation. Christopher Henson has now in fact an FDA indication in face of 184V, and I fully believe it will be sufficient to handle his virus even in the context of haphazard adherence.  Furthermore if he ends up being on chemotherapy which I suspect is very likely to be in his future we will really want to be without cobicistat and all the drug drug and asked interactions that this drug poses.  I have personally spent 85 minutes involved in face-to-face and non-face-to-face activities for this patient on the day of the visit. Professional time spent includes the following activities: Preparing to see the patient (review of tests), Obtaining and/or reviewing separately obtained history (admission/discharge record), Performing a medically appropriate examination and/or evaluation , Ordering medications/tests/procedures, referring and communicating with other health care professionals, Documenting clinical information in the EMR, Independently interpreting results (not separately reported), Communicating results to the patient/family/caregiver, Counseling and educating the patient/family/caregiver and Care coordination  (not separately reported). -        Review of Systems: Review of Systems  Constitutional:  Negative for chills, diaphoresis, fever, malaise/fatigue and weight loss.  HENT:  Negative for congestion, hearing loss, sore throat and tinnitus.   Eyes:  Negative for blurred vision and double vision.  Respiratory:  Negative for cough, sputum production, shortness of breath and wheezing.   Cardiovascular:  Positive for chest pain. Negative for palpitations and leg swelling.  Gastrointestinal:  Negative for abdominal pain, blood in stool, constipation, diarrhea, heartburn, melena, nausea and vomiting.  Genitourinary:  Negative for dysuria, flank pain and hematuria.  Musculoskeletal:  Negative for back pain, falls, joint pain and myalgias.  Skin:  Negative for itching and rash.  Neurological:  Negative for dizziness, sensory change, focal weakness, loss of consciousness, weakness and headaches.  Endo/Heme/Allergies:  Does not bruise/bleed easily.  Psychiatric/Behavioral:  Negative for depression, memory loss and suicidal ideas. The patient is not nervous/anxious.     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   GERD (gastroesophageal reflux disease)    History of cryptococcal meningitis    Protein calorie malnutrition (HCC)    Thrombocytopenia (HCC) 06/2018    Social History   Tobacco Use  Smoking status: Every Day    Current packs/day: 1.00    Types: Cigarettes   Smokeless tobacco: Never  Vaping Use   Vaping status: Never Used  Substance Use Topics   Alcohol use: Yes    Comment: 1/2 pint on weekend ; liquor   Drug use: Yes    Frequency: 7.0 times per week    Types: Marijuana    Comment: daily    Family History  Problem Relation Age of Onset   Diabetes Mother    Hypertension Mother    Diabetes Father    Hypertension Father    Healthy Sister    Healthy  Brother    Healthy Sister    Healthy Sister    Healthy Sister    Healthy Brother    Healthy Brother    Heart disease Neg Hx    Cancer Neg Hx    Stroke Neg Hx    No Known Allergies  OBJECTIVE: Blood pressure (!) 138/103, pulse 83, temperature 99.1 F (37.3 C), temperature source Oral, resp. rate 15, SpO2 100%.  Physical Exam Constitutional:      Appearance: He is well-developed.  HENT:     Head: Normocephalic and atraumatic.  Eyes:     Conjunctiva/sclera: Conjunctivae normal.  Cardiovascular:     Rate and Rhythm: Normal rate and regular rhythm.  Pulmonary:     Effort: Pulmonary effort is normal. No respiratory distress.     Breath sounds: No wheezing.  Abdominal:     General: There is no distension.     Palpations: Abdomen is soft.  Musculoskeletal:        General: No tenderness. Normal range of motion.     Cervical back: Normal range of motion and neck supple.  Skin:    General: Skin is warm and dry.     Coloration: Skin is not pale.     Findings: No erythema or rash.  Neurological:     General: No focal deficit present.     Mental Status: He is alert and oriented to person, place, and time.  Psychiatric:        Mood and Affect: Mood normal.        Behavior: Behavior normal.        Thought Content: Thought content normal.        Judgment: Judgment normal.    Patient was undergoing thoracentesis by pulmonary critical care medicine when I interviewed him at the bedside in the emergency department  Lab Results Lab Results  Component Value Date   WBC 11.2 (H) 12/18/2022   HGB 16.7 12/18/2022   HCT 49.0 12/18/2022   MCV 89.7 12/18/2022   PLT 220 12/18/2022    Lab Results  Component Value Date   CREATININE 1.20 12/18/2022   BUN 12 12/18/2022   NA 139 12/18/2022   K 3.4 (L) 12/18/2022   CL 105 12/18/2022   CO2 24 12/18/2022    Lab Results  Component Value Date   ALT 19 12/18/2022   AST 24 12/18/2022   ALKPHOS 77 12/18/2022   BILITOT 1.0 12/18/2022      Microbiology: Recent Results (from the past 240 hour(s))  Resp panel by RT-PCR (RSV, Flu A&B, Covid) Anterior Nasal Swab     Status: None   Collection Time: 12/18/22  2:35 AM   Specimen: Anterior Nasal Swab  Result Value Ref Range Status   SARS Coronavirus 2 by RT PCR NEGATIVE NEGATIVE Final    Comment: (NOTE) SARS-CoV-2 target nucleic acids are  NOT DETECTED.  The SARS-CoV-2 RNA is generally detectable in upper respiratory specimens during the acute phase of infection. The lowest concentration of SARS-CoV-2 viral copies this assay can detect is 138 copies/mL. A negative result does not preclude SARS-Cov-2 infection and should not be used as the sole basis for treatment or other patient management decisions. A negative result may occur with  improper specimen collection/handling, submission of specimen other than nasopharyngeal swab, presence of viral mutation(s) within the areas targeted by this assay, and inadequate number of viral copies(<138 copies/mL). A negative result must be combined with clinical observations, patient history, and epidemiological information. The expected result is Negative.  Fact Sheet for Patients:  BloggerCourse.com  Fact Sheet for Healthcare Providers:  SeriousBroker.it  This test is no t yet approved or cleared by the [place] FDA and  has been authorized for detection and/or diagnosis of SARS-CoV-2 by FDA under an Emergency Use Authorization (EUA). This EUA will remain  in effect (meaning this test can be used) for the duration of the COVID-19 declaration under Section 564(b)(1) of the Act, 21 U.S.C.section 360bbb-3(b)(1), unless the authorization is terminated  or revoked sooner.       Influenza A by PCR NEGATIVE NEGATIVE Final   Influenza B by PCR NEGATIVE NEGATIVE Final    Comment: (NOTE) The Xpert Xpress SARS-CoV-2/FLU/RSV plus assay is intended as an aid in the diagnosis of  influenza from Nasopharyngeal swab specimens and should not be used as a sole basis for treatment. Nasal washings and aspirates are unacceptable for Xpert Xpress SARS-CoV-2/FLU/RSV testing.  Fact Sheet for Patients: BloggerCourse.com  Fact Sheet for Healthcare Providers: SeriousBroker.it  This test is not yet approved or cleared by the [place] FDA and has been authorized for detection and/or diagnosis of SARS-CoV-2 by FDA under an Emergency Use Authorization (EUA). This EUA will remain in effect (meaning this test can be used) for the duration of the COVID-19 declaration under Section 564(b)(1) of the Act, 21 U.S.C. section 360bbb-3(b)(1), unless the authorization is terminated or revoked.     Resp Syncytial Virus by PCR NEGATIVE NEGATIVE Final    Comment: (NOTE) Fact Sheet for Patients: BloggerCourse.com  Fact Sheet for Healthcare Providers: SeriousBroker.it  This test is not yet approved or cleared by the [place] FDA and has been authorized for detection and/or diagnosis of SARS-CoV-2 by FDA under an Emergency Use Authorization (EUA). This EUA will remain in effect (meaning this test can be used) for the duration of the COVID-19 declaration under Section 564(b)(1) of the Act, 21 U.S.C. section 360bbb-3(b)(1), unless the authorization is terminated or revoked.  Performed at [facility], [address], [place], [place] [address]     Acey Lav, MD [facility] [phone] pager  12/18/2022, 6:49 PM

## 2022-12-18 NOTE — H&P (Addendum)
History and Physical    Patient: Christopher Henson PIR:[id number] DOB: 07-12-1980 DOA: 12/18/2022 DOS: the patient was seen and examined on 12/18/2022 PCP: [name], PA-C  Patient coming from: Home  Chief Complaint:  Chief Complaint  Patient presents with   Chest Pain   HPI: Christopher Henson is a 42 y.o. male with medical history significant of AIDS, alcohol abuse in remission, cryptococcal meningitis, GERD, protein calorie malnutrition, thrombocytopenia, tobacco abuse who presented to the emergency department complaints of chest pain associated with mild dyspnea.  He was seen on 12/12/2022 in the emergency department with similar symptoms and diagnosed with a mediastinal mass but left AMA.  He stated he has been feeling weak for the past month and has been losing some weight in the last 2 4 to 6 weeks.  He has not been able to take his antiretroviral medications in the last week due to cost.   No palpitations, diaphoresis, PND, orthopnea or pitting edema of the lower extremities. He has had night sweats, chills, but no fever rhinorrhea, sore throat, wheezing or hemoptysis. No abdominal pain, nausea, emesis, diarrhea, constipation, melena or hematochezia.  No flank pain, dysuria, frequency or hematuria.  No polyuria, polydipsia, polyphagia or blurred vision.   Lab work: His urinalysis was unremarkable.  CBC with a white count 11.2, hemoglobin 16.3 g/dL and platelets 630.  Normal PT, INR and PTT.  Negative coronavirus, influenza and RSV PCR.  Troponin x 2 normal.  Lactic acid is 2.7 and 1.7 mmol/L.  CMP showed a potassium of 3.1 mmol/L, glucose on 133 and corrected calcium of 8.8 mg/dL.  The rest of the CMP measurements were normal.  Imaging: CTA chest and CT abdomen/pelvis with no pulmonary embolus.  There was redemonstration of the 8.0 x 7.5 cm anterior mediastinal mass extending to the left hilar region which is concerning for malignancy.  Associated moderate narrowing due to external mass effect of  the left upper lobe pulmonary artery.  Caliber of the artery down to 4 mm.  There is interval increase in peribronchovascular groundglass airspace opacity within the left upper lobe.  Findings may represent infection/inflammation versus atelectasis in the setting of mild external mass effect on the left upper and lower bronchioles.  Left upper lobe nodule measuring 1.6 x 1.2 cm.  Interval increase in size of small left pleural effusion with associated basilar enhancing nodularity.  No evidence of metastatic disease within the abdomen and pelvis.   ED course: Initial vital signs were temperature 97.6 F, pulse 78, respiration 24, BP 93/64 mmHg O2 sat 97%.  The patient received 450 mL of LR bolus followed by continuous infusion at 150 mL/h, fentanyl 50 mcg IVP, hydromorphone 0.5 mg IVP and was started on IV Bactrim every 6 hours.  Review of Systems: As mentioned in the history of present illness. All other systems reviewed and are negative. 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   GERD (gastroesophageal reflux disease)    History of cryptococcal meningitis    Protein calorie malnutrition (HCC)    Thrombocytopenia (HCC) 06/2018   Past Surgical History:  Procedure Laterality Date   LUMBAR PUNCTURE  06/2018   during eval for meningitis    Social History:  reports that he has been smoking cigarettes. He has never used smokeless tobacco. He reports current alcohol use. He reports current drug use. Frequency: 7.00 times per week. Drug: Marijuana.  No Known Allergies  Family History  Problem Relation Age of Onset   Diabetes Mother    Hypertension Mother    Diabetes Father    Hypertension Father    Healthy Sister    Healthy Brother    Healthy Sister    Healthy Sister    Healthy Sister    Healthy Brother    Healthy Brother    Heart disease Neg Hx    Cancer Neg Hx    Stroke  Neg Hx     Prior to Admission medications   Medication Sig Start Date End Date Taking? Authorizing Provider  acetaminophen (TYLENOL) 325 MG tablet Take 2 tablets (650 mg total) by mouth every 6 (six) hours as needed for mild pain (or Fever >/= 101). 06/21/18  Yes Elgergawy, Leana Roe, MD  bictegravir-emtricitabine-tenofovir AF (BIKTARVY) 50-200-25 MG TABS tablet Take 1 tablet by mouth daily. 12/14/22  Yes Veryl Speak, FNP  darunavir-cobicistat (PREZCOBIX) 800-150 MG tablet Swallow whole. Do NOT crush, break or chew tablets. Take with food. 12/14/22  Yes Veryl Speak, FNP  methocarbamol (ROBAXIN) 500 MG tablet Take 1 tablet (500 mg total) by mouth 2 (two) times daily. 05/29/22  Yes Lorre Nick, MD  amoxicillin (AMOXIL) 500 MG capsule Take 1 capsule (500 mg total) by mouth 3 (three) times daily. Patient not taking: Reported on 12/18/2022 06/08/22   Gardiner Barefoot, MD    Physical Exam: Vitals:   12/18/22 0415 12/18/22 0430 12/18/22 0658 12/18/22 0700  BP: (!) 129/118 134/88  (!) 150/98  Pulse: 88 84  79  Resp: 18 (!) 26  18  Temp:   97.7 F (36.5 C)   TempSrc:   Oral   SpO2: 95% 99%  93%   Physical Exam Vitals and nursing note reviewed.  Constitutional:      General: He is awake. He is not in acute distress.    Appearance: He is well-developed. He is ill-appearing.  HENT:     Head: Normocephalic.     Nose: No rhinorrhea.     Mouth/Throat:     Mouth: Mucous membranes are moist.  Eyes:     Pupils: Pupils are equal, round, and reactive to light.  Neck:     Vascular: No JVD.  Cardiovascular:     Rate and Rhythm: Normal rate and regular rhythm.  Pulmonary:     Breath sounds: No wheezing, rhonchi or rales.  Abdominal:     General: Bowel sounds are normal. There is no distension.     Palpations: Abdomen is soft.     Tenderness: There is no abdominal tenderness. There is no guarding.  Musculoskeletal:     Cervical back: Neck supple.     Right lower leg: No edema.     Left  lower leg: No edema.  Skin:    General: Skin is warm and dry.  Neurological:     General: No focal deficit present.     Mental Status: He is alert and oriented to person, place, and time.  Psychiatric:        Mood and Affect: Mood normal.        Behavior: Behavior normal. Behavior is cooperative.     Data Reviewed:  Results are pending, will review when available.  Assessment and Plan: Principal Problem:   Pleural effusion on left In the setting of:   Mediastinal mass Admit to PCU/inpatient.\ Continue supplemental oxygen as needed. Continue time-limited IV fluids. Bronchodilators as needed. Bactrim discontinued after discussion with ID. -  No high suspicion for pneumocystis pneumonia. -Started on Unasyn per their recommendation for obstructive pneumonia coverage. PCCM consulted. -PCCM procedure and recommendations appreciated. -Left-sided thoracentesis performed. -Will follow fluid analysis report. -Navigational bronchoscopy at some point. -Per Dr. Wynona Neat option for bronchoscopy would be on 9/16 or 9/23 as outpatient procedure.  -He has also recommended that the patient gets his PET scan which is also outpatient.  Active Problems:   HIV (human immunodeficiency virus infection) (HCC) Dr. Daiva Eves will have the ID team same at some point. I will consult TOC team to see how we can get medication coverage. I will hold off on antiretrovirals pending ID consult and postdischarge med coverage.    Hypokalemia Replacing. Will follow potassium level in the morning.    Advance Care Planning:   Code Status: Full Code   Consults: PCCM and ID.  TOC team.  Family Communication:   Severity of Illness: The appropriate patient status for this patient is INPATIENT. Inpatient status is judged to be reasonable and necessary in order to provide the required intensity of service to ensure the patient's safety. The patient's presenting symptoms, physical exam findings, and initial  radiographic and laboratory data in the context of their chronic comorbidities is felt to place them at high risk for further clinical deterioration. Furthermore, it is not anticipated that the patient will be medically stable for discharge from the hospital within 2 midnights of admission.   * I certify that at the point of admission it is my clinical judgment that the patient will require inpatient hospital care spanning beyond 2 midnights from the point of admission due to high intensity of service, high risk for further deterioration and high frequency of surveillance required.*  Author: Bobette Mo, MD 12/18/2022 8:20 AM  For on call review [email or web address].   This document was prepared using Dragon voice recognition software and may contain some unintended transcription errors.

## 2022-12-18 NOTE — Procedures (Signed)
Thoracentesis  Procedure Note  Jovin Melka  [id number]  09-07-1980  Date:12/18/22  Time:1:19 PM   Provider Performing:Terrel Manalo E  Cherlynn Polo   Procedure: Thoracentesis with imaging guidance (32440)  Indication(s) Pleural Effusion  Consent Risks of the procedure as well as the alternatives and risks of each were explained to the patient and/or caregiver.  Consent for the procedure was obtained and is signed in the bedside chart  Anesthesia Topical only with 1% lidocaine    Time Out Verified patient identification, verified procedure, site/side was marked, verified correct patient position, special equipment/implants available, medications/allergies/relevant history reviewed, required imaging and test results available.   Sterile Technique Maximal sterile technique including full sterile barrier drape, hand hygiene, sterile gown, sterile gloves, mask, hair covering, sterile ultrasound probe cover (if used).  Procedure Description Ultrasound was used to identify appropriate pleural anatomy for placement and overlying skin marked.  Area of drainage cleaned and draped in sterile fashion. Lidocaine was used to anesthetize the skin and subcutaneous tissue.  800 cc's of blood tinged appearing fluid was drained from the left pleural space. Catheter then removed and bandaid applied to site.   Complications/Tolerance None; patient tolerated the procedure well. Chest X-ray is ordered to confirm no post-procedural complication.   EBL Minimal   Specimen(s) Pleural fluid

## 2022-12-18 NOTE — ED Notes (Signed)
Pt took off O2. Pt sats 90-94% on RA

## 2022-12-18 NOTE — ED Notes (Signed)
Pt's O2 ranging 84-88% on RA. Pt placed on 2lpm, O2 sat increased to 86%. O2 sat increased to 3lpm, O2 sat 88%. O2 increased to 4lpm, O2 ranging 90-94%.

## 2022-12-18 NOTE — Progress Notes (Signed)
Pharmacy Antibiotic Note  Christopher Henson is a 42 y.o. male admitted on 12/18/2022 with pneumocystis pneumonia.  PMH significant for HIV, chronically elevated LFTs, thrombocytopenia, alcohol abuse. Pharmacy has been consulted for Bactrim dosing.  CTangio = mediastinal mass concerning for malignancy, left upper lobe pulmonary nodule, opacticies in left upper lobe which may represent infection/inflammation v. Atelectasis   Plan: Bactrim 5mg /kg trimethoprim IV q6h Follow renal function F/u culture results and sensitivities     Temp (24hrs), Avg:97.6 F (36.4 C), Min:97.6 F (36.4 C), Max:97.6 F (36.4 C)  Recent Labs  Lab 12/12/22 0442 12/14/22 1132 12/18/22 0226 12/18/22 0251 12/18/22 0256  WBC 6.1 6.8 11.2*  --   --   CREATININE 1.18 0.89 1.06 1.20  --   LATICACIDVEN  --   --   --   --  2.7*    Estimated Creatinine Clearance: 86.2 mL/min (by C-G formula based on SCr of 1.2 mg/dL).    No Known Allergies  Antimicrobials this admission: 9/9 Bactrim >>      Dose adjustments this admission:    Microbiology results: 9/9 BCx:      Thank you for allowing pharmacy to be a part of this patient's care.  Maryellen Pile, PharmD 12/18/2022 4:28 AM

## 2022-12-18 NOTE — ED Notes (Signed)
ED TO INPATIENT HANDOFF REPORT  ED Nurse Name and Phone #: Crist Infante RN [phone]  S Name/Age/Gender Christopher Henson 42 y.o. male Room/Bed: WA23/WA23  Code Status   Code Status: Full Code  Home/SNF/Other Home Patient oriented to: self, place, time, and situation Is this baseline? Yes   Triage Complete: Triage complete  Chief Complaint Mediastinal mass [J98.59]  Triage Note Pt c/o upper chest pain onset a couple of hours ago with mild sob. Reports just lying in the bed when pain started. Pain radiates to shoulder and back of neck   Allergies No Known Allergies  Level of Care/Admitting Diagnosis ED Disposition     ED Disposition  Admit   Condition  --   Comment  Hospital Area: [facility] [[id number]]  Level of Care: Progressive [102]  Admit to Progressive based on following criteria: RESPIRATORY PROBLEMS hypoxemic/hypercapnic respiratory failure that is responsive to NIPPV (BiPAP) or High Flow Nasal Cannula (6-80 lpm). Frequent assessment/intervention, no > Q2 hrs < Q4 hrs, to maintain oxygenation and pulmonary hygiene.  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   GERD (gastroesophageal reflux disease)    History of cryptococcal meningitis    Protein calorie malnutrition (HCC)    Thrombocytopenia (HCC) 06/2018   Past Surgical History:  Procedure Laterality Date    LUMBAR PUNCTURE  06/2018   during eval for meningitis      A IV Location/Drains/Wounds Patient Lines/Drains/Airways Status     Active Line/Drains/Airways     Name Placement date Placement time Site Days   Peripheral IV 12/18/22 18 G Left Antecubital 12/18/22  0243  Antecubital  less than 1            Intake/Output Last 24 hours  Intake/Output Summary (Last 24 hours) at 12/18/2022 1740 Last data filed at 12/18/2022 1301 Gross per 24 hour  Intake 1750.41 ml  Output --  Net 1750.41 ml    Labs/Imaging Results for orders placed or performed during the hospital encounter of 12/18/22 (from the past 48 hour(s))  CBC     Status: Abnormal   Collection Time: 12/18/22  2:26 AM  Result Value Ref Range   WBC 11.2 (H) 4.0 - 10.5 K/uL   RBC 5.05 4.22 - 5.81 MIL/uL   Hemoglobin 16.3 13.0 - 17.0 g/dL   HCT 95.6 21.3 - 08.6 %   MCV 89.7 80.0 - 100.0 fL   MCH 32.3 26.0 - 34.0 pg   MCHC 36.0 30.0 - 36.0 g/dL   RDW 57.8 46.9 - 62.9 %   Platelets 220 150 - 400 K/uL   nRBC 0.0 0.0 - 0.2 %    Comment: Performed at [facility], [address], [place], [place] [address]  Troponin I (High Sensitivity)     Status: None   Collection Time: 12/18/22  2:26 AM  Result Value Ref Range   Troponin I (High Sensitivity) 7 <18 ng/L  Comment: (NOTE) Elevated high sensitivity troponin I (hsTnI) values and significant  changes across serial measurements may suggest ACS but many other  chronic and acute conditions are known to elevate hsTnI results.  Refer to the "Links" section for chest pain algorithms and additional  guidance. Performed at [facility], [address] [place], [place] [address]   Comprehensive metabolic panel     Status: Abnormal   Collection Time: 12/18/22  2:26 AM  Result Value Ref Range   Sodium 136 135 - 145 mmol/L   Potassium 3.1 (L) 3.5 - 5.1 mmol/L   Chloride 103 98 - 111 mmol/L   CO2 24 22 - 32 mmol/L   Glucose, Bld 133 (H) 70  - 99 mg/dL    Comment: Glucose reference range applies only to samples taken after fasting for at least 8 hours.   BUN 12 6 - 20 mg/dL   Creatinine, Ser 8.29 0.61 - 1.24 mg/dL   Calcium 8.5 (L) 8.9 - 10.3 mg/dL   Total Protein 6.9 6.5 - 8.1 g/dL   Albumin 3.7 3.5 - 5.0 g/dL   AST 24 15 - 41 U/L   ALT 19 0 - 44 U/L   Alkaline Phosphatase 77 38 - 126 U/L   Total Bilirubin 1.0 0.3 - 1.2 mg/dL   GFR, Estimated >56 >21 mL/min    Comment: (NOTE) Calculated using the CKD-EPI Creatinine Equation (2021)    Anion gap 9 5 - 15    Comment: Performed at [facility], [address] [place], [place] [address]  Protime-INR     Status: None   Collection Time: 12/18/22  2:26 AM  Result Value Ref Range   Prothrombin Time 13.8 11.4 - 15.2 seconds   INR 1.0 0.8 - 1.2    Comment: (NOTE) INR goal varies based on device and disease states. Performed at [facility], [address] [place], [place] [address]   APTT     Status: None   Collection Time: 12/18/22  2:26 AM  Result Value Ref Range   aPTT 24 24 - 36 seconds    Comment: Performed at [facility], [address] [place], [place] [address]  Resp panel by RT-PCR (RSV, Flu A&B, Covid) Anterior Nasal Swab     Status: None   Collection Time: 12/18/22  2:35 AM   Specimen: Anterior Nasal Swab  Result Value Ref Range   SARS Coronavirus 2 by RT PCR NEGATIVE NEGATIVE    Comment: (NOTE) SARS-CoV-2 target nucleic acids are NOT DETECTED.  The SARS-CoV-2 RNA is generally detectable in upper respiratory specimens during the acute phase of infection. The lowest concentration of SARS-CoV-2 viral copies this assay can detect is 138 copies/mL. A negative result does not preclude SARS-Cov-2 infection and should not be used as the sole basis for treatment or other patient management decisions. A negative result may occur with  improper specimen collection/handling, submission of specimen  other than nasopharyngeal swab, presence of viral mutation(s) within the areas targeted by this assay, and inadequate number of viral copies(<138 copies/mL). A negative result must be combined with clinical observations, patient history, and epidemiological information. The expected result is Negative.  Fact Sheet for Patients:  BloggerCourse.com  Fact Sheet for Healthcare Providers:  SeriousBroker.it  This test is no t yet approved or cleared by the [place] FDA and  has been authorized for detection and/or diagnosis of SARS-CoV-2 by FDA under an Emergency Use Authorization (EUA). This EUA  will remain  in effect (meaning this test can be used) for the duration of the COVID-19 declaration under Section 564(b)(1) of the Act, 21 U.S.C.section 360bbb-3(b)(1), unless the authorization is terminated  or revoked sooner.       Influenza A by PCR NEGATIVE NEGATIVE   Influenza B by PCR NEGATIVE NEGATIVE    Comment: (NOTE) The Xpert Xpress SARS-CoV-2/FLU/RSV plus assay is intended as an aid in the diagnosis of influenza from Nasopharyngeal swab specimens and should not be used as a sole basis for treatment. Nasal washings and aspirates are unacceptable for Xpert Xpress SARS-CoV-2/FLU/RSV testing.  Fact Sheet for Patients: BloggerCourse.com  Fact Sheet for Healthcare Providers: SeriousBroker.it  This test is not yet approved or cleared by the [place] FDA and has been authorized for detection and/or diagnosis of SARS-CoV-2 by FDA under an Emergency Use Authorization (EUA). This EUA will remain in effect (meaning this test can be used) for the duration of the COVID-19 declaration under Section 564(b)(1) of the Act, 21 U.S.C. section 360bbb-3(b)(1), unless the authorization is terminated or revoked.     Resp Syncytial Virus by PCR NEGATIVE NEGATIVE    Comment: (NOTE) Fact  Sheet for Patients: BloggerCourse.com  Fact Sheet for Healthcare Providers: SeriousBroker.it  This test is not yet approved or cleared by the [place] FDA and has been authorized for detection and/or diagnosis of SARS-CoV-2 by FDA under an Emergency Use Authorization (EUA). This EUA will remain in effect (meaning this test can be used) for the duration of the COVID-19 declaration under Section 564(b)(1) of the Act, 21 U.S.C. section 360bbb-3(b)(1), unless the authorization is terminated or revoked.  Performed at [facility], [address] [place], [place] [address]   I-stat chem 8, ED (not at [facility], [facility] or [facility])     Status: Abnormal   Collection Time: 12/18/22  2:51 AM  Result Value Ref Range   Sodium 139 135 - 145 mmol/L   Potassium 3.4 (L) 3.5 - 5.1 mmol/L   Chloride 105 98 - 111 mmol/L   BUN 12 6 - 20 mg/dL   Creatinine, Ser 2.95 0.61 - 1.24 mg/dL   Glucose, Bld 621 (H) 70 - 99 mg/dL    Comment: Glucose reference range applies only to samples taken after fasting for at least 8 hours.   Calcium, Ion 1.01 (L) 1.15 - 1.40 mmol/L   TCO2 23 22 - 32 mmol/L   Hemoglobin 16.7 13.0 - 17.0 g/dL   HCT 30.8 65.7 - 84.6 %  I-Stat Lactic Acid, ED     Status: Abnormal   Collection Time: 12/18/22  2:56 AM  Result Value Ref Range   Lactic Acid, Venous 2.7 (HH) 0.5 - 1.9 mmol/L   Comment NOTIFIED PHYSICIAN   Troponin I (High Sensitivity)     Status: None   Collection Time: 12/18/22  5:24 AM  Result Value Ref Range   Troponin I (High Sensitivity) 6 <18 ng/L    Comment: (NOTE) Elevated high sensitivity troponin I (hsTnI) values and significant  changes across serial measurements may suggest ACS but many other  chronic and acute conditions are known to elevate hsTnI results.  Refer to the "Links" section for chest pain algorithms and additional  guidance. Performed at [facility], [address] [place], [place] [address]   I-Stat Lactic Acid, ED     Status: None   Collection Time: 12/18/22  5:51 AM  Result Value Ref Range   Lactic Acid, Venous 1.7  0.5 - 1.9 mmol/L  Urinalysis, w/ Reflex to Culture (Infection Suspected) -Urine, Clean Catch     Status: Abnormal   Collection Time: 12/18/22  5:52 AM  Result Value Ref Range   Specimen Source URINE, CLEAN CATCH    Color, Urine STRAW (A) YELLOW   APPearance CLEAR CLEAR   Specific Gravity, Urine 1.025 1.005 - 1.030   pH 7.0 5.0 - 8.0   Glucose, UA NEGATIVE NEGATIVE mg/dL   Hgb urine dipstick NEGATIVE NEGATIVE   Bilirubin Urine NEGATIVE NEGATIVE   Ketones, ur NEGATIVE NEGATIVE mg/dL   Protein, ur NEGATIVE NEGATIVE mg/dL   Nitrite NEGATIVE NEGATIVE   Leukocytes,Ua NEGATIVE NEGATIVE   RBC / HPF 0-5 0 - 5 RBC/hpf   WBC, UA 0-5 0 - 5 WBC/hpf    Comment:        Reflex urine culture not performed if WBC <=10, OR if Squamous epithelial cells >5. If Squamous epithelial cells >5 suggest recollection.    Bacteria, UA NONE SEEN NONE SEEN   Squamous Epithelial / HPF 0-5 0 - 5 /HPF    Comment: Performed at [facility], [address], [place], [place] [address]  Pleural fluid cell count with differential     Status: Abnormal   Collection Time: 12/18/22  1:12 PM  Result Value Ref Range   Fluid Type-FCT PLEURAL    Color, Fluid AMBER (A) YELLOW   Appearance, Fluid CLOUDY (A) CLEAR   Total Nucleated Cell Count, Fluid 3,365 (H) 0 - 1,000 cu mm   Neutrophil Count, Fluid 21 0 - 25 %   Lymphs, Fluid 69 %   Monocyte-Macrophage-Serous Fluid 10 (L) 50 - 90 %    Comment: Performed at [facility], [address] [place], [place] [address]  Glucose, pleural fluid     Status: None   Collection Time: 12/18/22  1:12 PM  Result Value Ref Range   Glucose, Fluid 105 mg/dL    Comment: (NOTE) No normal range established for this test Results should be evaluated in conjunction with serum values    Fluid  Type-FGLU PLEURAL     Comment: Performed at [facility], [address] [place], [place] [address]  Protein, pleural fluid     Status: None   Collection Time: 12/18/22  1:12 PM  Result Value Ref Range   Total protein, fluid 4.4 g/dL    Comment: (NOTE) No normal range established for this test Results should be evaluated in conjunction with serum values    Fluid Type-FTP PLEURAL     Comment: Performed at [facility], [address] [place], [place] [address]  Lactate dehydrogenase, pleural fluid     Status: Abnormal   Collection Time: 12/18/22  1:12 PM  Result Value Ref Range   LD, Fluid 2,453 (H) 3 - 23 U/L    Comment: (NOTE) Results should be evaluated in conjunction with serum values    Fluid Type-FLDH Pleural, L     Comment: Performed at [facility], [address], [place], [place] [address]  Protein, total     Status: None   Collection Time: 12/18/22  1:12 PM  Result Value Ref Range   Total Protein 6.7 6.5 - 8.1 g/dL    Comment: Performed at [facility], [address], [place], [place] [address]  Lactate dehydrogenase     Status: Abnormal   Collection Time: 12/18/22  1:12 PM  Result Value Ref Range  LDH 598 (H) 98 - 192 U/L    Comment: Performed at [facility], [address], [place], [place] [address]  Cholesterol, total     Status: None   Collection Time: 12/18/22  1:12 PM  Result Value Ref Range   Cholesterol 171 0 - 200 mg/dL    Comment: Performed at [facility], [address] [place], [place] [address]   DG Chest Port 1 View  Result Date: 12/18/2022 CLINICAL DATA:  Status post thoracentesis. EXAM: PORTABLE CHEST 1 VIEW COMPARISON:  CT earlier 12/18/2022 FINDINGS: Persistent anterior mediastinal mass centered left of midline. Tiny left effusion. No pneumothorax. Mild left lung base opacity. Right lung is grossly clear. Normal cardiac silhouette.  No edema. Overlapping cardiac leads. IMPRESSION: Known mediastinal mass. Tiny residual left effusion. No pneumothorax. Electronically Signed   By: Karen Kays M.D.   On: 12/18/2022 15:55   CT Angio Chest PE W and/or Wo Contrast  Result Date: 12/18/2022 CLINICAL DATA:  Pulmonary embolism (PE) suspected, low to intermediate prob, positive D-dimer; Metastatic disease evaluation Pt c/o upper chest pain onset a couple of hours ago with mild sob. Reports just lying in the bed when pain started. Pain radiates to shoulder and back of neck Metastatic disease evaluation EXAM: CT ANGIOGRAPHY CHEST CT ABDOMEN AND PELVIS WITH CONTRAST TECHNIQUE: Multidetector CT imaging of the chest was performed using the standard protocol during bolus administration of intravenous contrast. Multiplanar CT image reconstructions and MIPs were obtained to evaluate the vascular anatomy. Multidetector CT imaging of the abdomen and pelvis was performed using the standard protocol during bolus administration of intravenous contrast. RADIATION DOSE REDUCTION: This exam was performed according to the departmental dose-optimization program which includes automated exposure control, adjustment of the mA and/or kV according to patient size and/or use of iterative reconstruction technique. CONTRAST:  OMNIPAQUE IOHEXOL 350 MG/ML SOLN COMPARISON:  CT chest 12/12/2022 FINDINGS: CTA CHEST FINDINGS Cardiovascular: Satisfactory opacification of the pulmonary arteries to the segmental level. No evidence of pulmonary embolism. There is marked narrowing due to external mass effect of the left upper lobe pulmonary artery. Caliber of the artery down to 4 mm. Normal heart size. No significant pericardial effusion. The thoracic aorta is normal in caliber. No atherosclerotic plaque of the thoracic aorta. No coronary artery calcifications. Mediastinum/Nodes: Redemonstration of approximately 8 x 7.5 cm anterior mediastinal mass that extends into the left hilar  region with lymphadenopathy. Prominent but nonenlarged right hilar lymph nodes measuring up to 0.9 cm (6:69). Enlarged precardiac lymph nodes measuring up to 1 cm (6:13). No invasion of the pericardium from the mass is noted. No enlarged axillary lymph nodes. Thyroid gland, trachea, and esophagus demonstrate no significant findings. Lungs/Pleura: Partial collapse of the left lower lobe. Interval increase in peribronchovascular ground-glass airspace opacities within the left upper lobe. Left upper lobe pulmonary nodule measuring 1.6 x 1.2 cm (6:42). No pulmonary mass. Interval increase in size of a small left pleural effusion with associated vague enhancing nodularity. Mild external mass effect on the left upper and lower bronchials. No pneumothorax. Musculoskeletal: No chest wall abnormality. No suspicious lytic or blastic osseous lesions. No acute displaced fracture. Multilevel degenerative changes of the spine. Review of the MIP images confirms the above findings. CT ABDOMEN and PELVIS FINDINGS Hepatobiliary: Vague hypodensity along the falciform ligament of left hepatic lobe likely focal fatty infiltration. No focal liver abnormality. No gallstones, gallbladder wall thickening, or pericholecystic fluid. No biliary dilatation. Pancreas: No focal lesion. Normal pancreatic contour.  No surrounding inflammatory changes. No main pancreatic ductal dilatation. Spleen: Normal in size without focal abnormality. Adrenals/Urinary Tract: No adrenal nodule bilaterally. Mild malrotation of the right kidney. Bilateral kidneys enhance symmetrically. No hydronephrosis. No hydroureter. The urinary bladder is unremarkable. Stomach/Bowel: Stomach is within normal limits. No evidence of bowel wall thickening or dilatation. Appendix appears normal. Vascular/Lymphatic: No abdominal aorta or iliac aneurysm. Mild atherosclerotic plaque of the aorta and its branches. No abdominal, pelvic, or inguinal lymphadenopathy. Reproductive: Prostate  is unremarkable. Other: No intraperitoneal free fluid. No intraperitoneal free gas. No organized fluid collection. Musculoskeletal: Tiny fat containing umbilical hernia. No suspicious lytic or blastic osseous lesions. No acute displaced fracture. L5-S1 intervertebral disc space vacuum phenomenon, posterior disc osteophyte complex formation, endplate sclerosis. Review of the MIP images confirms the above findings. IMPRESSION: 1. No pulmonary embolus. 2. Redemonstration of approximately 8 x 7.5 cm anterior mediastinal mass extending to the left hilar region. Finding concerning for malignancy. Associated marked narrowing due to external mass effect of the left upper lobe pulmonary artery. Caliber of the artery down to 4 mm. 3. Interval increase in peribronchovascular ground-glass airspace opacities within the left upper lobe. Finding may represent infection/inflammation versus atelectasis in the setting of mild external mass effect on the left upper and lower bronchials. 4. Left upper lobe pulmonary nodule measuring 1.6 x 1.2 cm. 5. Interval increase in size of a small left pleural effusion with associated vague enhancing nodularity. 6. No evidence of metastatic disease within the abdomen and pelvis. Electronically Signed   By: Tish Frederickson M.D.   On: 12/18/2022 03:56   CT ABDOMEN PELVIS W CONTRAST  Result Date: 12/18/2022 CLINICAL DATA:  Pulmonary embolism (PE) suspected, low to intermediate prob, positive D-dimer; Metastatic disease evaluation Pt c/o upper chest pain onset a couple of hours ago with mild sob. Reports just lying in the bed when pain started. Pain radiates to shoulder and back of neck Metastatic disease evaluation EXAM: CT ANGIOGRAPHY CHEST CT ABDOMEN AND PELVIS WITH CONTRAST TECHNIQUE: Multidetector CT imaging of the chest was performed using the standard protocol during bolus administration of intravenous contrast. Multiplanar CT image reconstructions and MIPs were obtained to evaluate the  vascular anatomy. Multidetector CT imaging of the abdomen and pelvis was performed using the standard protocol during bolus administration of intravenous contrast. RADIATION DOSE REDUCTION: This exam was performed according to the departmental dose-optimization program which includes automated exposure control, adjustment of the mA and/or kV according to patient size and/or use of iterative reconstruction technique. CONTRAST:  OMNIPAQUE IOHEXOL 350 MG/ML SOLN COMPARISON:  CT chest 12/12/2022 FINDINGS: CTA CHEST FINDINGS Cardiovascular: Satisfactory opacification of the pulmonary arteries to the segmental level. No evidence of pulmonary embolism. There is marked narrowing due to external mass effect of the left upper lobe pulmonary artery. Caliber of the artery down to 4 mm. Normal heart size. No significant pericardial effusion. The thoracic aorta is normal in caliber. No atherosclerotic plaque of the thoracic aorta. No coronary artery calcifications. Mediastinum/Nodes: Redemonstration of approximately 8 x 7.5 cm anterior mediastinal mass that extends into the left hilar region with lymphadenopathy. Prominent but nonenlarged right hilar lymph nodes measuring up to 0.9 cm (6:69). Enlarged precardiac lymph nodes measuring up to 1 cm (6:13). No invasion of the pericardium from the mass is noted. No enlarged axillary lymph nodes. Thyroid gland, trachea, and esophagus demonstrate no significant findings. Lungs/Pleura: Partial collapse of the left lower lobe. Interval increase in peribronchovascular ground-glass airspace opacities within the left upper lobe. Left  upper lobe pulmonary nodule measuring 1.6 x 1.2 cm (6:42). No pulmonary mass. Interval increase in size of a small left pleural effusion with associated vague enhancing nodularity. Mild external mass effect on the left upper and lower bronchials. No pneumothorax. Musculoskeletal: No chest wall abnormality. No suspicious lytic or blastic osseous lesions. No  acute displaced fracture. Multilevel degenerative changes of the spine. Review of the MIP images confirms the above findings. CT ABDOMEN and PELVIS FINDINGS Hepatobiliary: Vague hypodensity along the falciform ligament of left hepatic lobe likely focal fatty infiltration. No focal liver abnormality. No gallstones, gallbladder wall thickening, or pericholecystic fluid. No biliary dilatation. Pancreas: No focal lesion. Normal pancreatic contour. No surrounding inflammatory changes. No main pancreatic ductal dilatation. Spleen: Normal in size without focal abnormality. Adrenals/Urinary Tract: No adrenal nodule bilaterally. Mild malrotation of the right kidney. Bilateral kidneys enhance symmetrically. No hydronephrosis. No hydroureter. The urinary bladder is unremarkable. Stomach/Bowel: Stomach is within normal limits. No evidence of bowel wall thickening or dilatation. Appendix appears normal. Vascular/Lymphatic: No abdominal aorta or iliac aneurysm. Mild atherosclerotic plaque of the aorta and its branches. No abdominal, pelvic, or inguinal lymphadenopathy. Reproductive: Prostate is unremarkable. Other: No intraperitoneal free fluid. No intraperitoneal free gas. No organized fluid collection. Musculoskeletal: Tiny fat containing umbilical hernia. No suspicious lytic or blastic osseous lesions. No acute displaced fracture. L5-S1 intervertebral disc space vacuum phenomenon, posterior disc osteophyte complex formation, endplate sclerosis. Review of the MIP images confirms the above findings. IMPRESSION: 1. No pulmonary embolus. 2. Redemonstration of approximately 8 x 7.5 cm anterior mediastinal mass extending to the left hilar region. Finding concerning for malignancy. Associated marked narrowing due to external mass effect of the left upper lobe pulmonary artery. Caliber of the artery down to 4 mm. 3. Interval increase in peribronchovascular ground-glass airspace opacities within the left upper lobe. Finding may  represent infection/inflammation versus atelectasis in the setting of mild external mass effect on the left upper and lower bronchials. 4. Left upper lobe pulmonary nodule measuring 1.6 x 1.2 cm. 5. Interval increase in size of a small left pleural effusion with associated vague enhancing nodularity. 6. No evidence of metastatic disease within the abdomen and pelvis. Electronically Signed   By: Tish Frederickson M.D.   On: 12/18/2022 03:56    Pending Labs Unresulted Labs (From admission, onward)     Start     Ordered   12/19/22 0500  Comprehensive metabolic panel  Daily,   R      12/18/22 1025   12/19/22 0500  CBC  Daily,   R      12/18/22 1025   12/19/22 0500  Magnesium  Tomorrow morning,   R        12/18/22 1025   12/19/22 0500  Phosphorus  Tomorrow morning,   R        12/18/22 1025   12/18/22 1220  Pneumocystis smear by DFA  Once,   R       Question:  Patient immune status  Answer:  Immunocompromised   12/18/22 1219   12/18/22 1219  Acid Fast Smear (AFB)  (AFB smear + Culture w reflexed sensitivities panel)  Once,   R       Question:  Patient immune status  Answer:  Immunocompromised  Placed in "And" Linked Group   12/18/22 1219   12/18/22 1219  Acid Fast Culture with reflexed sensitivities  (AFB smear + Culture w reflexed sensitivities panel)  Once,   R       Question:  Patient immune status  Answer:  Immunocompromised  Placed in "And" Linked Group   12/18/22 1219   12/18/22 1218  Cholesterol, pleural fluid  (Bedside Thoracentesis)  Once,   R        12/18/22 1219   12/18/22 1217  Pleural fluid culture w Gram Stain  (Bedside Thoracentesis)  Once,   R       Question Answer Comment  Are there also cytology or pathology orders on this specimen? Yes   Patient immune status Immunocompromised      12/18/22 1219   12/18/22 0235  Blood Culture (routine x 2)  (Septic presentation on arrival (screening labs, nursing and treatment orders for obvious sepsis))  BLOOD CULTURE X 2,   STAT       12/18/22 0235            Vitals/Pain Today's Vitals   12/18/22 1306 12/18/22 1442 12/18/22 1600 12/18/22 1657  BP: (!) 138/90  (!) 138/103   Pulse: 85  83   Resp: 19  15   Temp:  (!) 97.5 F (36.4 C)    TempSrc:  Oral    SpO2: 97%  100%   PainSc:    0-No pain    Isolation Precautions No active isolations  Medications Medications  ipratropium-albuterol (DUONEB) 0.5-2.5 (3) MG/3ML nebulizer solution 3 mL (has no administration in time range)  enoxaparin (LOVENOX) injection 40 mg (has no administration in time range)  acetaminophen (TYLENOL) tablet 650 mg (has no administration in time range)    Or  acetaminophen (TYLENOL) suppository 650 mg (has no administration in time range)  ondansetron (ZOFRAN) tablet 4 mg (has no administration in time range)    Or  ondansetron (ZOFRAN) injection 4 mg (has no administration in time range)  Ampicillin-Sulbactam (UNASYN) 3 g in sodium chloride 0.9 % 100 mL IVPB (3 g Intravenous New Bag/Given 12/18/22 1730)  0.9 % NaCl with KCl 20 mEq/ L  infusion ( Intravenous New Bag/Given 12/18/22 1217)  lactated ringers bolus 1,000 mL (0 mLs Intravenous Stopped 12/18/22 0426)    And  lactated ringers bolus 250 mL (0 mLs Intravenous Stopped 12/18/22 0426)  fentaNYL (SUBLIMAZE) injection 50 mcg (50 mcg Intravenous Given 12/18/22 0256)  iohexol (OMNIPAQUE) 350 MG/ML injection 100 mL (100 mLs Intravenous Contrast Given 12/18/22 0303)  HYDROmorphone (DILAUDID) injection 0.5 mg (0.5 mg Intravenous Given 12/18/22 0513)  potassium chloride SA (KLOR-CON M) CR tablet 40 mEq (40 mEq Oral Given 12/18/22 1215)  magnesium sulfate IVPB 2 g 50 mL (0 g Intravenous Stopped 12/18/22 1301)    Mobility walks     Focused Assessments Neuro Assessment Handoff:  Swallow screen pass?  N/A         Neuro Assessment: Within Defined Limits Neuro Checks:      Has TPA been given? No If patient is a Neuro Trauma and patient is going to OR before floor call report to 4N Charge nurse:  [phone] or [phone]   R Recommendations: See Admitting Provider Note  Report given to:   Additional Notes:

## 2022-12-18 NOTE — Progress Notes (Signed)
Pt being followed by [facility] for Sepsis protocol. 

## 2022-12-18 NOTE — Consult Note (Signed)
NAME:  Christopher Henson, MRN:  [id number], DOB:  [birth year], LOS: 0 ADMISSION DATE:  12/18/2022, CONSULTATION DATE:  12/18/2022  REFERRING MD:  Dr. Sanda Klein, CHIEF COMPLAINT:  pleural effusion   History of Present Illness:  42 year old male with a past medical history of HIV/AIDS (last CD4 count 258, HIV quant 89 on 12/14/2022), alcohol use disorder, protein calorie malnutrition, history of cryptococcal meningitis who presented to the emergency department on 12/18/2022 with chest pain and shortness of breath.  On arrival he states that the symptoms had began the night before.  It was noted that he was seen in the emergency department recently on 12/12/2022 and had a chest x-ray that was concerning for a mediastinal mass.  He had a CT but he left AMA.  Today in the emergency department he had repeated CT imaging showed no pulmonary embolus.  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  There was associated mass effect on the left upper lobe pulmonary artery.  Also noted to have a left upper lobe pulmonary nodule that is 1.6 x 1.2 cm.  Noted to have a left-sided pleural effusion.  He did have a CT abdomen pelvis as well which showed no metastatic disease in the abdomen or pelvis.  Labs with WBC 11.2, K of 3.1, negative respiratory panel, lactic 2.7, negative troponin.  He was admitted to hospitalist.  Hospitalist consulted PCCM for thoracentesis of the left pleural effusion.  On my interview, the patient states that he has not had his Prezcobix medication in 2 months and has not been able to take his Biktarvy over the past 2 weeks related to cost and insurance.  He notes that he has felt mildly short of breath and has had progressively worsening symptoms over the past 2 weeks.  Also notes that he has had about 10 to 15 pounds weight loss over the past 1 to 2 months, night sweats.  Denies having overt fevers, diarrhea.  Has not noticed any lymphadenopathy.  Denies any new cough or  sputum production, sick contacts, recent travel.  Pertinent  Medical History  HIV/AIDS (last CD4 count 258, HIV quant 89 on 12/14/2022), alcohol use disorder, protein calorie malnutrition, history of cryptococcal meningitis  Significant Hospital Events: Including procedures, antibiotic start and stop dates in addition to other pertinent events   12/18/2022: Diagnostic and therapeutic thoracentesis in the ED  Interim History / Subjective:  Continues to feel chest pressure centrally in the upper left chest.  Denying pleuritic symptoms.  Feels mildly short of breath.  States that he does have more symptoms with lying on the left side.  Endorsing recent unintentional weight loss, night sweats.  Objective   Blood pressure (!) 138/90, pulse 85, temperature (!) 97.5 F (36.4 C), temperature source Oral, resp. rate 19, SpO2 97%.        Intake/Output Summary (Last 24 hours) at 12/18/2022 1320 Last data filed at 12/18/2022 1301 Gross per 24 hour  Intake 1750.41 ml  Output --  Net 1750.41 ml   There were no vitals filed for this visit.  Examination: General: Middle-age male sitting in bed in no acute distress on nasal cannula HENT: Moist mucous membranes, EOM intact, no congestion or rhinorrhea Lungs: Decreased lung sounds on the left lower lobe.  Crackles in the right lower lobe. Cardiovascular: S1/S2 without murmur, rub, gallop Extremities: No edema or rash Neuro: Alert, oriented x 3.  Nonfocal exam  Resolved Hospital Problem list     Assessment &  Plan:  Left pleural effusion; Unknown etiology.  It does appear to be loculated on CT imaging.  History and imaging with known mediastinal mass makes this concerning for a malignant pleural effusion. Thoracentesis performed on 12/18/2022 -Pending pleural fluid cytology, cell count, protein, LDH, glucose, cholesterol, infectious evaluation -AFB, PCP sent -Obtaining serum total protein, LDH and cholesterol  Mediastinal mass; on CT imaging noted to  have a 8 x 7.5 cm anterior mediastinal mass with mass effect on the left upper lobe pulmonary artery.  In the setting of known HIV, concerning for lymphoma versus other malignancy. -case discussed with Byrum, MD with plan for navigational bronchoscopy timing TBD  HIV; last CD4 count 258, HIV quant 89 -ID following, appreciate recommendations  Alcohol use disorder; continues to drink alcohol per patient -Treat per primary team Best Practice (right click and "Reselect all SmartList Selections" daily)  All below per primary admitting team  Diet/type:  DVT prophylaxis:  GI prophylaxis:  Lines:  Foley:   Code Status:   Last date of multidisciplinary goals of care discussion   Labs   CBC: Recent Labs  Lab 12/12/22 0442 12/14/22 1132 12/18/22 0226 12/18/22 0251  WBC 6.1 6.8 11.2*  --   NEUTROABS 3.6 4,474  --   --   HGB 15.5 16.4 16.3 16.7  HCT 43.2 48.1 45.3 49.0  MCV 90.8 93.8 89.7  --   PLT 189 209 220  --     Basic Metabolic Panel: Recent Labs  Lab 12/12/22 0442 12/14/22 1132 12/18/22 0226 12/18/22 0251  NA 138 141 136 139  K 3.4* 3.8 3.1* 3.4*  CL 103 105 103 105  CO2 25 27 24   --   GLUCOSE 118* 84 133* 123*  BUN 10 7 12 12   CREATININE 1.18 0.89 1.06 1.20  CALCIUM 8.7* 9.4 8.5*  --    GFR: Estimated Creatinine Clearance: 86.2 mL/min (by C-G formula based on SCr of 1.2 mg/dL). Recent Labs  Lab 12/12/22 0442 12/14/22 1132 12/18/22 0226 12/18/22 0256 12/18/22 0551  WBC 6.1 6.8 11.2*  --   --   LATICACIDVEN  --   --   --  2.7* 1.7    Liver Function Tests: Recent Labs  Lab 12/12/22 0442 12/14/22 1132 12/18/22 0226  AST 23 19 24   ALT 16 13 19   ALKPHOS 76  --  77  BILITOT 0.4 0.5 1.0  PROT 6.1* 6.5 6.9  ALBUMIN 3.3*  --  3.7   Recent Labs  Lab 12/12/22 0442  LIPASE 30   No results for input(s): "AMMONIA" in the last 168 hours.  ABG    Component Value Date/Time   HCO3 19.9 (L) 12/28/2006 1818   TCO2 23 12/18/2022 0251   ACIDBASEDEF 4.0  (H) 12/28/2006 1818     Coagulation Profile: Recent Labs  Lab 12/18/22 0226  INR 1.0    Cardiac Enzymes: No results for input(s): "CKTOTAL", "CKMB", "CKMBINDEX", "TROPONINI" in the last 168 hours.  HbA1C: No results found for: "HGBA1C"  CBG: No results for input(s): "GLUCAP" in the last 168 hours.  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   Surgical History:   Past Surgical History:  Procedure Laterality Date   LUMBAR PUNCTURE  06/2018   during  eval for meningitis      Social History:   reports that he has been smoking cigarettes. He has never used smokeless tobacco. He reports current alcohol use. He reports current drug use. Frequency: 7.00 times per week. Drug: Marijuana.   Family History:  His family history includes Diabetes in his father and mother; Healthy in his brother, brother, brother, sister, sister, sister, and sister; Hypertension in his father and mother. There is no history of Heart disease, Cancer, or Stroke.   Allergies No Known Allergies   Home Medications  Prior to Admission medications   Medication Sig Start Date End Date Taking? Authorizing Provider  acetaminophen (TYLENOL) 325 MG tablet Take 2 tablets (650 mg total) by mouth every 6 (six) hours as needed for mild pain (or Fever >/= 101). 06/21/18  Yes Elgergawy, Leana Roe, MD  bictegravir-emtricitabine-tenofovir AF (BIKTARVY) 50-200-25 MG TABS tablet Take 1 tablet by mouth daily. 12/14/22  Yes Veryl Speak, FNP  darunavir-cobicistat (PREZCOBIX) 800-150 MG tablet Swallow whole. Do NOT crush, break or chew tablets. Take with food. 12/14/22  Yes Veryl Speak, FNP   methocarbamol (ROBAXIN) 500 MG tablet Take 1 tablet (500 mg total) by mouth 2 (two) times daily. 05/29/22  Yes Lorre Nick, MD  amoxicillin (AMOXIL) 500 MG capsule Take 1 capsule (500 mg total) by mouth 3 (three) times daily. Patient not taking: Reported on 12/18/2022 06/08/22   Gardiner Barefoot, MD     Critical care time:

## 2022-12-18 NOTE — ED Provider Notes (Signed)
[place] EMERGENCY DEPARTMENT AT [facility] Provider Note   CSN: [id number] Arrival date & time: 12/18/22  0220     History  Chief Complaint  Patient presents with   Chest Pain    Furqan Grieco is a 42 y.o. male.  The history is provided by the patient and medical records.  Chest Pain  42 year old male with history of HIV (last CD4 258 on 12/15/22), chronically elevated LFTs, thrombocytopenia, alcohol abuse, GERD, presenting to the ED with chest pain and shortness of breath that began this evening while in bed.  Denies falls/trauma.  Reports pain in the chest and radiating to left shoulder blade.  No vomiting/diaphoresis.  No prior cardiac hx. Patient is a smoker.  Of note, patient seen here 12/12/22 and had CXR concerning for mediastinal mass.  He was sent for chest CT but left AMA prior to receiving results.    Home Medications Prior to Admission medications   Medication Sig Start Date End Date Taking? Authorizing Provider  acetaminophen (TYLENOL) 325 MG tablet Take 2 tablets (650 mg total) by mouth every 6 (six) hours as needed for mild pain (or Fever >/= 101). 06/21/18  Yes Elgergawy, Leana Roe, MD  bictegravir-emtricitabine-tenofovir AF (BIKTARVY) 50-200-25 MG TABS tablet Take 1 tablet by mouth daily. 12/14/22  Yes Veryl Speak, FNP  darunavir-cobicistat (PREZCOBIX) 800-150 MG tablet Swallow whole. Do NOT crush, break or chew tablets. Take with food. 12/14/22  Yes Veryl Speak, FNP  methocarbamol (ROBAXIN) 500 MG tablet Take 1 tablet (500 mg total) by mouth 2 (two) times daily. 05/29/22  Yes Lorre Nick, MD  amoxicillin (AMOXIL) 500 MG capsule Take 1 capsule (500 mg total) by mouth 3 (three) times daily. Patient not taking: Reported on 12/18/2022 06/08/22   Gardiner Barefoot, MD      Allergies    Patient has no known allergies.    Review of Systems   Review of Systems  Cardiovascular:  Positive for chest pain.  All other systems reviewed and are  negative.   Physical Exam Updated Vital Signs BP 134/88   Pulse 84   Temp 97.6 F (36.4 C)   Resp (!) 26   SpO2 99%   Physical Exam Vitals and nursing note reviewed.  Constitutional:      Appearance: He is well-developed. He is ill-appearing.     Comments: Pale appearing, appears to have lost weight as clothes baggy  HENT:     Head: Normocephalic and atraumatic.  Eyes:     Conjunctiva/sclera: Conjunctivae normal.     Pupils: Pupils are equal, round, and reactive to light.  Cardiovascular:     Rate and Rhythm: Normal rate and regular rhythm.     Heart sounds: Normal heart sounds.  Pulmonary:     Effort: Pulmonary effort is normal.     Breath sounds: Normal breath sounds.  Abdominal:     General: Bowel sounds are normal.     Palpations: Abdomen is soft.  Musculoskeletal:        General: Normal range of motion.     Cervical back: Normal range of motion.  Skin:    General: Skin is warm and dry.  Neurological:     Mental Status: He is alert and oriented to person, place, and time.     ED Results / Procedures / Treatments   Labs (all labs ordered are listed, but only abnormal results are displayed) Labs Reviewed  CBC - Abnormal; Notable for the following components:  Result Value   WBC 11.2 (*)    All other components within normal limits  COMPREHENSIVE METABOLIC PANEL - Abnormal; Notable for the following components:   Potassium 3.1 (*)    Glucose, Bld 133 (*)    Calcium 8.5 (*)    All other components within normal limits  I-STAT CG4 LACTIC ACID, ED - Abnormal; Notable for the following components:   Lactic Acid, Venous 2.7 (*)    All other components within normal limits  I-STAT CHEM 8, ED - Abnormal; Notable for the following components:   Potassium 3.4 (*)    Glucose, Bld 123 (*)    Calcium, Ion 1.01 (*)    All other components within normal limits  RESP PANEL BY RT-PCR (RSV, FLU A&B, COVID)  RVPGX2  CULTURE, BLOOD (ROUTINE X 2)  CULTURE, BLOOD  (ROUTINE X 2)  PROTIME-INR  APTT  URINALYSIS, W/ REFLEX TO CULTURE (INFECTION SUSPECTED)  I-STAT CG4 LACTIC ACID, ED  TROPONIN I (HIGH SENSITIVITY)  TROPONIN I (HIGH SENSITIVITY)    EKG EKG Interpretation Date/Time:  Monday December 18 2022 02:30:36 EDT Ventricular Rate:  61 PR Interval:  129 QRS Duration:  167 QT Interval:  412 QTC Calculation: 415 R Axis:   69  Text Interpretation: Sinus rhythm Abnormal T, consider ischemia, lateral leads Confirmed by Nicanor Alcon, April ([id number]) on 12/18/2022 2:33:36 AM  Radiology CT Angio Chest PE W and/or Wo Contrast  Result Date: 12/18/2022 CLINICAL DATA:  Pulmonary embolism (PE) suspected, low to intermediate prob, positive D-dimer; Metastatic disease evaluation Pt c/o upper chest pain onset a couple of hours ago with mild sob. Reports just lying in the bed when pain started. Pain radiates to shoulder and back of neck Metastatic disease evaluation EXAM: CT ANGIOGRAPHY CHEST CT ABDOMEN AND PELVIS WITH CONTRAST TECHNIQUE: Multidetector CT imaging of the chest was performed using the standard protocol during bolus administration of intravenous contrast. Multiplanar CT image reconstructions and MIPs were obtained to evaluate the vascular anatomy. Multidetector CT imaging of the abdomen and pelvis was performed using the standard protocol during bolus administration of intravenous contrast. RADIATION DOSE REDUCTION: This exam was performed according to the departmental dose-optimization program which includes automated exposure control, adjustment of the mA and/or kV according to patient size and/or use of iterative reconstruction technique. CONTRAST:  OMNIPAQUE IOHEXOL 350 MG/ML SOLN COMPARISON:  CT chest 12/12/2022 FINDINGS: CTA CHEST FINDINGS Cardiovascular: Satisfactory opacification of the pulmonary arteries to the segmental level. No evidence of pulmonary embolism. There is marked narrowing due to external mass effect of the left upper lobe pulmonary  artery. Caliber of the artery down to 4 mm. Normal heart size. No significant pericardial effusion. The thoracic aorta is normal in caliber. No atherosclerotic plaque of the thoracic aorta. No coronary artery calcifications. Mediastinum/Nodes: Redemonstration of approximately 8 x 7.5 cm anterior mediastinal mass that extends into the left hilar region with lymphadenopathy. Prominent but nonenlarged right hilar lymph nodes measuring up to 0.9 cm (6:69). Enlarged precardiac lymph nodes measuring up to 1 cm (6:13). No invasion of the pericardium from the mass is noted. No enlarged axillary lymph nodes. Thyroid gland, trachea, and esophagus demonstrate no significant findings. Lungs/Pleura: Partial collapse of the left lower lobe. Interval increase in peribronchovascular ground-glass airspace opacities within the left upper lobe. Left upper lobe pulmonary nodule measuring 1.6 x 1.2 cm (6:42). No pulmonary mass. Interval increase in size of a small left pleural effusion with associated vague enhancing nodularity. Mild external mass effect on the left upper  and lower bronchials. No pneumothorax. Musculoskeletal: No chest wall abnormality. No suspicious lytic or blastic osseous lesions. No acute displaced fracture. Multilevel degenerative changes of the spine. Review of the MIP images confirms the above findings. CT ABDOMEN and PELVIS FINDINGS Hepatobiliary: Vague hypodensity along the falciform ligament of left hepatic lobe likely focal fatty infiltration. No focal liver abnormality. No gallstones, gallbladder wall thickening, or pericholecystic fluid. No biliary dilatation. Pancreas: No focal lesion. Normal pancreatic contour. No surrounding inflammatory changes. No main pancreatic ductal dilatation. Spleen: Normal in size without focal abnormality. Adrenals/Urinary Tract: No adrenal nodule bilaterally. Mild malrotation of the right kidney. Bilateral kidneys enhance symmetrically. No hydronephrosis. No hydroureter. The  urinary bladder is unremarkable. Stomach/Bowel: Stomach is within normal limits. No evidence of bowel wall thickening or dilatation. Appendix appears normal. Vascular/Lymphatic: No abdominal aorta or iliac aneurysm. Mild atherosclerotic plaque of the aorta and its branches. No abdominal, pelvic, or inguinal lymphadenopathy. Reproductive: Prostate is unremarkable. Other: No intraperitoneal free fluid. No intraperitoneal free gas. No organized fluid collection. Musculoskeletal: Tiny fat containing umbilical hernia. No suspicious lytic or blastic osseous lesions. No acute displaced fracture. L5-S1 intervertebral disc space vacuum phenomenon, posterior disc osteophyte complex formation, endplate sclerosis. Review of the MIP images confirms the above findings. IMPRESSION: 1. No pulmonary embolus. 2. Redemonstration of approximately 8 x 7.5 cm anterior mediastinal mass extending to the left hilar region. Finding concerning for malignancy. Associated marked narrowing due to external mass effect of the left upper lobe pulmonary artery. Caliber of the artery down to 4 mm. 3. Interval increase in peribronchovascular ground-glass airspace opacities within the left upper lobe. Finding may represent infection/inflammation versus atelectasis in the setting of mild external mass effect on the left upper and lower bronchials. 4. Left upper lobe pulmonary nodule measuring 1.6 x 1.2 cm. 5. Interval increase in size of a small left pleural effusion with associated vague enhancing nodularity. 6. No evidence of metastatic disease within the abdomen and pelvis. Electronically Signed   By: Tish Frederickson M.D.   On: 12/18/2022 03:56   CT ABDOMEN PELVIS W CONTRAST  Result Date: 12/18/2022 CLINICAL DATA:  Pulmonary embolism (PE) suspected, low to intermediate prob, positive D-dimer; Metastatic disease evaluation Pt c/o upper chest pain onset a couple of hours ago with mild sob. Reports just lying in the bed when pain started. Pain  radiates to shoulder and back of neck Metastatic disease evaluation EXAM: CT ANGIOGRAPHY CHEST CT ABDOMEN AND PELVIS WITH CONTRAST TECHNIQUE: Multidetector CT imaging of the chest was performed using the standard protocol during bolus administration of intravenous contrast. Multiplanar CT image reconstructions and MIPs were obtained to evaluate the vascular anatomy. Multidetector CT imaging of the abdomen and pelvis was performed using the standard protocol during bolus administration of intravenous contrast. RADIATION DOSE REDUCTION: This exam was performed according to the departmental dose-optimization program which includes automated exposure control, adjustment of the mA and/or kV according to patient size and/or use of iterative reconstruction technique. CONTRAST:  OMNIPAQUE IOHEXOL 350 MG/ML SOLN COMPARISON:  CT chest 12/12/2022 FINDINGS: CTA CHEST FINDINGS Cardiovascular: Satisfactory opacification of the pulmonary arteries to the segmental level. No evidence of pulmonary embolism. There is marked narrowing due to external mass effect of the left upper lobe pulmonary artery. Caliber of the artery down to 4 mm. Normal heart size. No significant pericardial effusion. The thoracic aorta is normal in caliber. No atherosclerotic plaque of the thoracic aorta. No coronary artery calcifications. Mediastinum/Nodes: Redemonstration of approximately 8 x 7.5 cm anterior mediastinal  mass that extends into the left hilar region with lymphadenopathy. Prominent but nonenlarged right hilar lymph nodes measuring up to 0.9 cm (6:69). Enlarged precardiac lymph nodes measuring up to 1 cm (6:13). No invasion of the pericardium from the mass is noted. No enlarged axillary lymph nodes. Thyroid gland, trachea, and esophagus demonstrate no significant findings. Lungs/Pleura: Partial collapse of the left lower lobe. Interval increase in peribronchovascular ground-glass airspace opacities within the left upper lobe. Left upper  lobe pulmonary nodule measuring 1.6 x 1.2 cm (6:42). No pulmonary mass. Interval increase in size of a small left pleural effusion with associated vague enhancing nodularity. Mild external mass effect on the left upper and lower bronchials. No pneumothorax. Musculoskeletal: No chest wall abnormality. No suspicious lytic or blastic osseous lesions. No acute displaced fracture. Multilevel degenerative changes of the spine. Review of the MIP images confirms the above findings. CT ABDOMEN and PELVIS FINDINGS Hepatobiliary: Vague hypodensity along the falciform ligament of left hepatic lobe likely focal fatty infiltration. No focal liver abnormality. No gallstones, gallbladder wall thickening, or pericholecystic fluid. No biliary dilatation. Pancreas: No focal lesion. Normal pancreatic contour. No surrounding inflammatory changes. No main pancreatic ductal dilatation. Spleen: Normal in size without focal abnormality. Adrenals/Urinary Tract: No adrenal nodule bilaterally. Mild malrotation of the right kidney. Bilateral kidneys enhance symmetrically. No hydronephrosis. No hydroureter. The urinary bladder is unremarkable. Stomach/Bowel: Stomach is within normal limits. No evidence of bowel wall thickening or dilatation. Appendix appears normal. Vascular/Lymphatic: No abdominal aorta or iliac aneurysm. Mild atherosclerotic plaque of the aorta and its branches. No abdominal, pelvic, or inguinal lymphadenopathy. Reproductive: Prostate is unremarkable. Other: No intraperitoneal free fluid. No intraperitoneal free gas. No organized fluid collection. Musculoskeletal: Tiny fat containing umbilical hernia. No suspicious lytic or blastic osseous lesions. No acute displaced fracture. L5-S1 intervertebral disc space vacuum phenomenon, posterior disc osteophyte complex formation, endplate sclerosis. Review of the MIP images confirms the above findings. IMPRESSION: 1. No pulmonary embolus. 2. Redemonstration of approximately 8 x 7.5 cm  anterior mediastinal mass extending to the left hilar region. Finding concerning for malignancy. Associated marked narrowing due to external mass effect of the left upper lobe pulmonary artery. Caliber of the artery down to 4 mm. 3. Interval increase in peribronchovascular ground-glass airspace opacities within the left upper lobe. Finding may represent infection/inflammation versus atelectasis in the setting of mild external mass effect on the left upper and lower bronchials. 4. Left upper lobe pulmonary nodule measuring 1.6 x 1.2 cm. 5. Interval increase in size of a small left pleural effusion with associated vague enhancing nodularity. 6. No evidence of metastatic disease within the abdomen and pelvis. Electronically Signed   By: Tish Frederickson M.D.   On: 12/18/2022 03:56    Procedures Procedures    CRITICAL CARE Performed by: Garlon Hatchet   Total critical care time: 45 minutes  Critical care time was exclusive of separately billable procedures and treating other patients.  Critical care was necessary to treat or prevent imminent or life-threatening deterioration.  Critical care was time spent personally by me on the following activities: development of treatment plan with patient and/or surrogate as well as nursing, discussions with consultants, evaluation of patient's response to treatment, examination of patient, obtaining history from patient or surrogate, ordering and performing treatments and interventions, ordering and review of laboratory studies, ordering and review of radiographic studies, pulse oximetry and re-evaluation of patient's condition.   Medications Ordered in ED Medications  lactated ringers infusion ( Intravenous New Bag/Given 12/18/22 0321)  HYDROmorphone (DILAUDID) injection 0.5 mg (has no administration in time range)  sulfamethoxazole-trimethoprim (BACTRIM) 415 mg of trimethoprim in dextrose 5 % 500 mL IVPB (has no administration in time range)  lactated ringers  bolus 1,000 mL (0 mLs Intravenous Stopped 12/18/22 0426)    And  lactated ringers bolus 250 mL (0 mLs Intravenous Stopped 12/18/22 0426)  fentaNYL (SUBLIMAZE) injection 50 mcg (50 mcg Intravenous Given 12/18/22 0256)  iohexol (OMNIPAQUE) 350 MG/ML injection 100 mL (100 mLs Intravenous Contrast Given 12/18/22 0303)    ED Course/ Medical Decision Making/ A&P                                 Medical Decision Making Amount and/or Complexity of Data Reviewed Labs: ordered. Radiology: ordered and independent interpretation performed. ECG/medicine tests: ordered and independent interpretation performed.  Risk Prescription drug management. Decision regarding hospitalization.   42 year old male presenting to the ED with chest pain and shortness of breath.  He is hypotensive, pale, and unwell appearing on exam.  Brief chart review, was seen here 12/12/2022 for same and had abnormal chest x-ray concerning for mediastinal mass.  He left AMA prior to results of CT scan.  Given concern for malignancy, hypotension, and worsening symptoms will obtain CTA to assess for PE, postobstructive pneumonia, or other acute findings.  Will also obtain CT of the abdomen and pelvis to evaluate for possible metastatic disease.  Labs pending.  Labs as above--lactate 2.7.  Leukocytosis at 11.2.  No significant electrolyte derangement.  Troponin is negative.  Blood cultures are pending.  COVID/flu negative.  CTA without findings of PE, does redemonstrate large mediastinal mass.  This does appear to compress the pulmonary arteries somewhat.  There is also question of postobstructive infection/inflammation.  Given he has known AIDS with last CD4 ~250, do feel it is prudent to cover for potential PCP pneumonia-- dosed per pharmacy after discussion with them.  He is currently on 2L supplemental O2, saturating in the mid 90's.  Still having some pain.  Does not appear to be ACS, suspect likely from tumor burden.  BP now 130's systolic so  given small dose of dilaudid.  He will require admission.  Discussed with hospitalist, Dr. Margo Aye-- will admit for ongoing care.  Anticipate oncology evaluation in the AM.  Final Clinical Impression(s) / ED Diagnoses Final diagnoses:  Mediastinal mass  AIDS (acquired immune deficiency syndrome) [facility])    Rx / DC Orders ED Discharge Orders     None         Garlon Hatchet, PA-C 12/18/22 Mertha Finders, April, MD 12/18/22 [phone]

## 2022-12-18 NOTE — ED Notes (Signed)
..ED TO INPATIENT HANDOFF REPORT  Name/Age/Gender Christopher Henson 42 y.o. male  Code Status    Code Status Orders  (From admission, onward)           Start     Ordered   12/18/22 0828  Full code  Continuous       Question:  By:  Answer:  Consent: discussion documented in EHR   12/18/22 0828           Code Status History     Date Active Date Inactive Code Status Order ID Comments User Context   06/08/2018 0855 06/21/2018 1833 Full Code [id number]  Rolly Salter, MD Inpatient       Home/SNF/Other Home  Chief Complaint Mediastinal mass [J98.59]  Level of Care/Admitting Diagnosis ED Disposition     ED Disposition  Admit   Condition  --   Comment  Hospital Area: [facility] [[id number]]  Level of Care: Progressive [102]  Admit to Progressive based on following criteria: RESPIRATORY PROBLEMS hypoxemic/hypercapnic respiratory failure that is responsive to NIPPV (BiPAP) or High Flow Nasal Cannula (6-80 lpm). Frequent assessment/intervention, no > Q2 hrs < Q4 hrs, to maintain oxygenation and pulmonary hygiene.  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   GERD (gastroesophageal reflux disease)    History of cryptococcal meningitis    Protein calorie malnutrition (HCC)    Thrombocytopenia (HCC) 06/2018    Allergies No Known Allergies  IV  Location/Drains/Wounds Patient Lines/Drains/Airways Status     Active Line/Drains/Airways     Name Placement date Placement time Site Days   Peripheral IV 12/18/22 18 G Left Antecubital 12/18/22  0243  Antecubital  less than 1            Labs/Imaging Results for orders placed or performed during the hospital encounter of 12/18/22 (from the past 48 hour(s))  CBC     Status: Abnormal   Collection Time: 12/18/22  2:26 AM  Result Value Ref Range   WBC 11.2 (H) 4.0 - 10.5 K/uL   RBC 5.05 4.22 - 5.81 MIL/uL   Hemoglobin 16.3 13.0 - 17.0 g/dL   HCT 78.4 69.6 - 29.5 %   MCV 89.7 80.0 - 100.0 fL   MCH 32.3 26.0 - 34.0 pg   MCHC 36.0 30.0 - 36.0 g/dL   RDW 28.4 13.2 - 44.0 %   Platelets 220 150 - 400 K/uL   nRBC 0.0 0.0 - 0.2 %    Comment: Performed at [facility], [address] [place], [place] [address]  Troponin I (High Sensitivity)     Status: None   Collection Time: 12/18/22  2:26 AM  Result Value Ref Range   Troponin I (High Sensitivity) 7 <18 ng/L    Comment: (NOTE) Elevated high sensitivity troponin I (hsTnI) values and significant  changes across serial measurements may suggest ACS but many other  chronic and acute conditions  are known to elevate hsTnI results.  Refer to the "Links" section for chest pain algorithms and additional  guidance. Performed at [facility], [address], [place], [place] [address]   Comprehensive metabolic panel     Status: Abnormal   Collection Time: 12/18/22  2:26 AM  Result Value Ref Range   Sodium 136 135 - 145 mmol/L   Potassium 3.1 (L) 3.5 - 5.1 mmol/L   Chloride 103 98 - 111 mmol/L   CO2 24 22 - 32 mmol/L   Glucose, Bld 133 (H) 70 - 99 mg/dL    Comment: Glucose reference range applies only to samples taken after fasting for at least 8 hours.   BUN 12 6 - 20 mg/dL   Creatinine, Ser 9.51 0.61 - 1.24 mg/dL   Calcium 8.5 (L) 8.9 - 10.3 mg/dL   Total Protein 6.9 6.5 - 8.1 g/dL   Albumin 3.7  3.5 - 5.0 g/dL   AST 24 15 - 41 U/L   ALT 19 0 - 44 U/L   Alkaline Phosphatase 77 38 - 126 U/L   Total Bilirubin 1.0 0.3 - 1.2 mg/dL   GFR, Estimated >88 >41 mL/min    Comment: (NOTE) Calculated using the CKD-EPI Creatinine Equation (2021)    Anion gap 9 5 - 15    Comment: Performed at [facility], [address] [place], [place] [address]  Protime-INR     Status: None   Collection Time: 12/18/22  2:26 AM  Result Value Ref Range   Prothrombin Time 13.8 11.4 - 15.2 seconds   INR 1.0 0.8 - 1.2    Comment: (NOTE) INR goal varies based on device and disease states. Performed at [facility], [address] [place], [place] [address]   APTT     Status: None   Collection Time: 12/18/22  2:26 AM  Result Value Ref Range   aPTT 24 24 - 36 seconds    Comment: Performed at [facility], [address], [place], [place] [address]  Resp panel by RT-PCR (RSV, Flu A&B, Covid) Anterior Nasal Swab     Status: None   Collection Time: 12/18/22  2:35 AM   Specimen: Anterior Nasal Swab  Result Value Ref Range   SARS Coronavirus 2 by RT PCR NEGATIVE NEGATIVE    Comment: (NOTE) SARS-CoV-2 target nucleic acids are NOT DETECTED.  The SARS-CoV-2 RNA is generally detectable in upper respiratory specimens during the acute phase of infection. The lowest concentration of SARS-CoV-2 viral copies this assay can detect is 138 copies/mL. A negative result does not preclude SARS-Cov-2 infection and should not be used as the sole basis for treatment or other patient management decisions. A negative result may occur with  improper specimen collection/handling, submission of specimen other than nasopharyngeal swab, presence of viral mutation(s) within the areas targeted by this assay, and inadequate number of viral copies(<138 copies/mL). A negative result must be combined with clinical observations, patient history, and epidemiological information.  The expected result is Negative.  Fact Sheet for Patients:  BloggerCourse.com  Fact Sheet for Healthcare Providers:  SeriousBroker.it  This test is no t yet approved or cleared by the [place] FDA and  has been authorized for detection and/or diagnosis of SARS-CoV-2 by FDA under an Emergency Use Authorization (EUA). This EUA will remain  in effect (meaning this test can be used) for the duration of the COVID-19 declaration under Section 564(b)(1) of the Act, 21 U.S.C.section 360bbb-3(b)(1),  unless the authorization is terminated  or revoked sooner.       Influenza A by PCR NEGATIVE NEGATIVE   Influenza B by PCR NEGATIVE NEGATIVE    Comment: (NOTE) The Xpert Xpress SARS-CoV-2/FLU/RSV plus assay is intended as an aid in the diagnosis of influenza from Nasopharyngeal swab specimens and should not be used as a sole basis for treatment. Nasal washings and aspirates are unacceptable for Xpert Xpress SARS-CoV-2/FLU/RSV testing.  Fact Sheet for Patients: BloggerCourse.com  Fact Sheet for Healthcare Providers: SeriousBroker.it  This test is not yet approved or cleared by the [place] FDA and has been authorized for detection and/or diagnosis of SARS-CoV-2 by FDA under an Emergency Use Authorization (EUA). This EUA will remain in effect (meaning this test can be used) for the duration of the COVID-19 declaration under Section 564(b)(1) of the Act, 21 U.S.C. section 360bbb-3(b)(1), unless the authorization is terminated or revoked.     Resp Syncytial Virus by PCR NEGATIVE NEGATIVE    Comment: (NOTE) Fact Sheet for Patients: BloggerCourse.com  Fact Sheet for Healthcare Providers: SeriousBroker.it  This test is not yet approved or cleared by the [place] FDA and has been authorized for detection and/or diagnosis of  SARS-CoV-2 by FDA under an Emergency Use Authorization (EUA). This EUA will remain in effect (meaning this test can be used) for the duration of the COVID-19 declaration under Section 564(b)(1) of the Act, 21 U.S.C. section 360bbb-3(b)(1), unless the authorization is terminated or revoked.  Performed at [facility], [address] [place], [place] [address]   I-stat chem 8, ED (not at [facility] or [facility])     Status: Abnormal   Collection Time: 12/18/22  2:51 AM  Result Value Ref Range   Sodium 139 135 - 145 mmol/L   Potassium 3.4 (L) 3.5 - 5.1 mmol/L   Chloride 105 98 - 111 mmol/L   BUN 12 6 - 20 mg/dL   Creatinine, Ser 7.82 0.61 - 1.24 mg/dL   Glucose, Bld 956 (H) 70 - 99 mg/dL    Comment: Glucose reference range applies only to samples taken after fasting for at least 8 hours.   Calcium, Ion 1.01 (L) 1.15 - 1.40 mmol/L   TCO2 23 22 - 32 mmol/L   Hemoglobin 16.7 13.0 - 17.0 g/dL   HCT 21.3 08.6 - 57.8 %  I-Stat Lactic Acid, ED     Status: Abnormal   Collection Time: 12/18/22  2:56 AM  Result Value Ref Range   Lactic Acid, Venous 2.7 (HH) 0.5 - 1.9 mmol/L   Comment NOTIFIED PHYSICIAN   Troponin I (High Sensitivity)     Status: None   Collection Time: 12/18/22  5:24 AM  Result Value Ref Range   Troponin I (High Sensitivity) 6 <18 ng/L    Comment: (NOTE) Elevated high sensitivity troponin I (hsTnI) values and significant  changes across serial measurements may suggest ACS but many other  chronic and acute conditions are known to elevate hsTnI results.  Refer to the "Links" section for chest pain algorithms and additional  guidance. Performed at [facility], [address], [place], [place] [address]   I-Stat Lactic Acid, ED     Status: None   Collection Time: 12/18/22  5:51 AM  Result Value Ref Range   Lactic Acid, Venous 1.7 0.5 - 1.9 mmol/L  Urinalysis, w/ Reflex to Culture (Infection Suspected) -Urine, Clean Catch     Status:  Abnormal   Collection Time: 12/18/22  5:52 AM  Result Value Ref Range   Specimen Source URINE, CLEAN CATCH    Color, Urine STRAW (A) YELLOW   APPearance CLEAR CLEAR   Specific Gravity, Urine 1.025 1.005 - 1.030   pH 7.0 5.0 - 8.0   Glucose, UA NEGATIVE NEGATIVE mg/dL   Hgb urine dipstick NEGATIVE NEGATIVE   Bilirubin Urine NEGATIVE NEGATIVE   Ketones, ur NEGATIVE NEGATIVE mg/dL   Protein, ur NEGATIVE NEGATIVE mg/dL   Nitrite NEGATIVE NEGATIVE   Leukocytes,Ua NEGATIVE NEGATIVE   RBC / HPF 0-5 0 - 5 RBC/hpf   WBC, UA 0-5 0 - 5 WBC/hpf    Comment:        Reflex urine culture not performed if WBC <=10, OR if Squamous epithelial cells >5. If Squamous epithelial cells >5 suggest recollection.    Bacteria, UA NONE SEEN NONE SEEN   Squamous Epithelial / HPF 0-5 0 - 5 /HPF    Comment: Performed at [facility], [address], [place], [place] [address]  Pleural fluid culture w Gram Stain     Status: None (Preliminary result)   Collection Time: 12/18/22  1:12 PM   Specimen: Pleural Fluid  Result Value Ref Range   Specimen Description      PLEURAL Performed at [facility], [address] [place], [place] [address]    Special Requests      Immunocompromised Performed at [facility], [address], [place], [place] [address]    Gram Stain      FEW WBC PRESENT, PREDOMINANTLY MONONUCLEAR NO ORGANISMS SEEN Performed at [facility], [address], [place], [place] [address]    Culture PENDING    Report Status PENDING   Pleural fluid cell count with differential     Status: Abnormal   Collection Time: 12/18/22  1:12 PM  Result Value Ref Range   Fluid Type-FCT PLEURAL    Color, Fluid AMBER (A) YELLOW   Appearance, Fluid CLOUDY (A) CLEAR   Total Nucleated Cell Count, Fluid 3,365 (H) 0 - 1,000 cu mm   Neutrophil Count, Fluid 21 0 - 25 %   Lymphs, Fluid 69 %   Monocyte-Macrophage-Serous Fluid 10 (L) 50 - 90 %     Comment: Performed at [facility], [address], [place], [place] [address]  Glucose, pleural fluid     Status: None   Collection Time: 12/18/22  1:12 PM  Result Value Ref Range   Glucose, Fluid 105 mg/dL    Comment: (NOTE) No normal range established for this test Results should be evaluated in conjunction with serum values    Fluid Type-FGLU PLEURAL     Comment: Performed at [facility], [address] [place], [place] [address]  Protein, pleural fluid     Status: None   Collection Time: 12/18/22  1:12 PM  Result Value Ref Range   Total protein, fluid 4.4 g/dL    Comment: (NOTE) No normal range established for this test Results should be evaluated in conjunction with serum values    Fluid Type-FTP PLEURAL     Comment: Performed at [facility], [address] [place], [place] [address]  Lactate dehydrogenase, pleural fluid     Status: Abnormal   Collection Time: 12/18/22  1:12 PM  Result Value Ref Range   LD, Fluid 2,453 (H) 3 - 23 U/L    Comment: (NOTE) Results should be evaluated in conjunction with serum values  Fluid Type-FLDH Pleural, L     Comment: Performed at [facility], [address], [place], [place] [address]  Protein, total     Status: None   Collection Time: 12/18/22  1:12 PM  Result Value Ref Range   Total Protein 6.7 6.5 - 8.1 g/dL    Comment: Performed at [facility], [address], [place], [place] [address]  Lactate dehydrogenase     Status: Abnormal   Collection Time: 12/18/22  1:12 PM  Result Value Ref Range   LDH 598 (H) 98 - 192 U/L    Comment: Performed at [facility], [address] [place], [place] [address]  Cholesterol, total     Status: None   Collection Time: 12/18/22  1:12 PM  Result Value Ref Range   Cholesterol 171 0 - 200 mg/dL    Comment: Performed at [facility], [address],  [place], [place] [address]   DG Chest Port 1 View  Result Date: 12/18/2022 CLINICAL DATA:  Status post thoracentesis. EXAM: PORTABLE CHEST 1 VIEW COMPARISON:  CT earlier 12/18/2022 FINDINGS: Persistent anterior mediastinal mass centered left of midline. Tiny left effusion. No pneumothorax. Mild left lung base opacity. Right lung is grossly clear. Normal cardiac silhouette. No edema. Overlapping cardiac leads. IMPRESSION: Known mediastinal mass. Tiny residual left effusion. No pneumothorax. Electronically Signed   By: Karen Kays M.D.   On: 12/18/2022 15:55   CT Angio Chest PE W and/or Wo Contrast  Result Date: 12/18/2022 CLINICAL DATA:  Pulmonary embolism (PE) suspected, low to intermediate prob, positive D-dimer; Metastatic disease evaluation Pt c/o upper chest pain onset a couple of hours ago with mild sob. Reports just lying in the bed when pain started. Pain radiates to shoulder and back of neck Metastatic disease evaluation EXAM: CT ANGIOGRAPHY CHEST CT ABDOMEN AND PELVIS WITH CONTRAST TECHNIQUE: Multidetector CT imaging of the chest was performed using the standard protocol during bolus administration of intravenous contrast. Multiplanar CT image reconstructions and MIPs were obtained to evaluate the vascular anatomy. Multidetector CT imaging of the abdomen and pelvis was performed using the standard protocol during bolus administration of intravenous contrast. RADIATION DOSE REDUCTION: This exam was performed according to the departmental dose-optimization program which includes automated exposure control, adjustment of the mA and/or kV according to patient size and/or use of iterative reconstruction technique. CONTRAST:  OMNIPAQUE IOHEXOL 350 MG/ML SOLN COMPARISON:  CT chest 12/12/2022 FINDINGS: CTA CHEST FINDINGS Cardiovascular: Satisfactory opacification of the pulmonary arteries to the segmental level. No evidence of pulmonary embolism. There is marked narrowing due to external mass effect of the  left upper lobe pulmonary artery. Caliber of the artery down to 4 mm. Normal heart size. No significant pericardial effusion. The thoracic aorta is normal in caliber. No atherosclerotic plaque of the thoracic aorta. No coronary artery calcifications. Mediastinum/Nodes: Redemonstration of approximately 8 x 7.5 cm anterior mediastinal mass that extends into the left hilar region with lymphadenopathy. Prominent but nonenlarged right hilar lymph nodes measuring up to 0.9 cm (6:69). Enlarged precardiac lymph nodes measuring up to 1 cm (6:13). No invasion of the pericardium from the mass is noted. No enlarged axillary lymph nodes. Thyroid gland, trachea, and esophagus demonstrate no significant findings. Lungs/Pleura: Partial collapse of the left lower lobe. Interval increase in peribronchovascular ground-glass airspace opacities within the left upper lobe. Left upper lobe pulmonary nodule measuring 1.6 x 1.2 cm (6:42). No pulmonary mass. Interval increase in size of a small  left pleural effusion with associated vague enhancing nodularity. Mild external mass effect on the left upper and lower bronchials. No pneumothorax. Musculoskeletal: No chest wall abnormality. No suspicious lytic or blastic osseous lesions. No acute displaced fracture. Multilevel degenerative changes of the spine. Review of the MIP images confirms the above findings. CT ABDOMEN and PELVIS FINDINGS Hepatobiliary: Vague hypodensity along the falciform ligament of left hepatic lobe likely focal fatty infiltration. No focal liver abnormality. No gallstones, gallbladder wall thickening, or pericholecystic fluid. No biliary dilatation. Pancreas: No focal lesion. Normal pancreatic contour. No surrounding inflammatory changes. No main pancreatic ductal dilatation. Spleen: Normal in size without focal abnormality. Adrenals/Urinary Tract: No adrenal nodule bilaterally. Mild malrotation of the right kidney. Bilateral kidneys enhance symmetrically. No  hydronephrosis. No hydroureter. The urinary bladder is unremarkable. Stomach/Bowel: Stomach is within normal limits. No evidence of bowel wall thickening or dilatation. Appendix appears normal. Vascular/Lymphatic: No abdominal aorta or iliac aneurysm. Mild atherosclerotic plaque of the aorta and its branches. No abdominal, pelvic, or inguinal lymphadenopathy. Reproductive: Prostate is unremarkable. Other: No intraperitoneal free fluid. No intraperitoneal free gas. No organized fluid collection. Musculoskeletal: Tiny fat containing umbilical hernia. No suspicious lytic or blastic osseous lesions. No acute displaced fracture. L5-S1 intervertebral disc space vacuum phenomenon, posterior disc osteophyte complex formation, endplate sclerosis. Review of the MIP images confirms the above findings. IMPRESSION: 1. No pulmonary embolus. 2. Redemonstration of approximately 8 x 7.5 cm anterior mediastinal mass extending to the left hilar region. Finding concerning for malignancy. Associated marked narrowing due to external mass effect of the left upper lobe pulmonary artery. Caliber of the artery down to 4 mm. 3. Interval increase in peribronchovascular ground-glass airspace opacities within the left upper lobe. Finding may represent infection/inflammation versus atelectasis in the setting of mild external mass effect on the left upper and lower bronchials. 4. Left upper lobe pulmonary nodule measuring 1.6 x 1.2 cm. 5. Interval increase in size of a small left pleural effusion with associated vague enhancing nodularity. 6. No evidence of metastatic disease within the abdomen and pelvis. Electronically Signed   By: Tish Frederickson M.D.   On: 12/18/2022 03:56   CT ABDOMEN PELVIS W CONTRAST  Result Date: 12/18/2022 CLINICAL DATA:  Pulmonary embolism (PE) suspected, low to intermediate prob, positive D-dimer; Metastatic disease evaluation Pt c/o upper chest pain onset a couple of hours ago with mild sob. Reports just lying in  the bed when pain started. Pain radiates to shoulder and back of neck Metastatic disease evaluation EXAM: CT ANGIOGRAPHY CHEST CT ABDOMEN AND PELVIS WITH CONTRAST TECHNIQUE: Multidetector CT imaging of the chest was performed using the standard protocol during bolus administration of intravenous contrast. Multiplanar CT image reconstructions and MIPs were obtained to evaluate the vascular anatomy. Multidetector CT imaging of the abdomen and pelvis was performed using the standard protocol during bolus administration of intravenous contrast. RADIATION DOSE REDUCTION: This exam was performed according to the departmental dose-optimization program which includes automated exposure control, adjustment of the mA and/or kV according to patient size and/or use of iterative reconstruction technique. CONTRAST:  OMNIPAQUE IOHEXOL 350 MG/ML SOLN COMPARISON:  CT chest 12/12/2022 FINDINGS: CTA CHEST FINDINGS Cardiovascular: Satisfactory opacification of the pulmonary arteries to the segmental level. No evidence of pulmonary embolism. There is marked narrowing due to external mass effect of the left upper lobe pulmonary artery. Caliber of the artery down to 4 mm. Normal heart size. No significant pericardial effusion. The thoracic aorta is normal in caliber. No atherosclerotic plaque of the  thoracic aorta. No coronary artery calcifications. Mediastinum/Nodes: Redemonstration of approximately 8 x 7.5 cm anterior mediastinal mass that extends into the left hilar region with lymphadenopathy. Prominent but nonenlarged right hilar lymph nodes measuring up to 0.9 cm (6:69). Enlarged precardiac lymph nodes measuring up to 1 cm (6:13). No invasion of the pericardium from the mass is noted. No enlarged axillary lymph nodes. Thyroid gland, trachea, and esophagus demonstrate no significant findings. Lungs/Pleura: Partial collapse of the left lower lobe. Interval increase in peribronchovascular ground-glass airspace opacities within  the left upper lobe. Left upper lobe pulmonary nodule measuring 1.6 x 1.2 cm (6:42). No pulmonary mass. Interval increase in size of a small left pleural effusion with associated vague enhancing nodularity. Mild external mass effect on the left upper and lower bronchials. No pneumothorax. Musculoskeletal: No chest wall abnormality. No suspicious lytic or blastic osseous lesions. No acute displaced fracture. Multilevel degenerative changes of the spine. Review of the MIP images confirms the above findings. CT ABDOMEN and PELVIS FINDINGS Hepatobiliary: Vague hypodensity along the falciform ligament of left hepatic lobe likely focal fatty infiltration. No focal liver abnormality. No gallstones, gallbladder wall thickening, or pericholecystic fluid. No biliary dilatation. Pancreas: No focal lesion. Normal pancreatic contour. No surrounding inflammatory changes. No main pancreatic ductal dilatation. Spleen: Normal in size without focal abnormality. Adrenals/Urinary Tract: No adrenal nodule bilaterally. Mild malrotation of the right kidney. Bilateral kidneys enhance symmetrically. No hydronephrosis. No hydroureter. The urinary bladder is unremarkable. Stomach/Bowel: Stomach is within normal limits. No evidence of bowel wall thickening or dilatation. Appendix appears normal. Vascular/Lymphatic: No abdominal aorta or iliac aneurysm. Mild atherosclerotic plaque of the aorta and its branches. No abdominal, pelvic, or inguinal lymphadenopathy. Reproductive: Prostate is unremarkable. Other: No intraperitoneal free fluid. No intraperitoneal free gas. No organized fluid collection. Musculoskeletal: Tiny fat containing umbilical hernia. No suspicious lytic or blastic osseous lesions. No acute displaced fracture. L5-S1 intervertebral disc space vacuum phenomenon, posterior disc osteophyte complex formation, endplate sclerosis. Review of the MIP images confirms the above findings. IMPRESSION: 1. No pulmonary embolus. 2.  Redemonstration of approximately 8 x 7.5 cm anterior mediastinal mass extending to the left hilar region. Finding concerning for malignancy. Associated marked narrowing due to external mass effect of the left upper lobe pulmonary artery. Caliber of the artery down to 4 mm. 3. Interval increase in peribronchovascular ground-glass airspace opacities within the left upper lobe. Finding may represent infection/inflammation versus atelectasis in the setting of mild external mass effect on the left upper and lower bronchials. 4. Left upper lobe pulmonary nodule measuring 1.6 x 1.2 cm. 5. Interval increase in size of a small left pleural effusion with associated vague enhancing nodularity. 6. No evidence of metastatic disease within the abdomen and pelvis. Electronically Signed   By: Tish Frederickson M.D.   On: 12/18/2022 03:56    Pending Labs Unresulted Labs (From admission, onward)     Start     Ordered   12/19/22 0500  Comprehensive metabolic panel  Daily,   R      12/18/22 1025   12/19/22 0500  CBC  Daily,   R      12/18/22 1025   12/19/22 0500  Magnesium  Tomorrow morning,   R        12/18/22 1025   12/19/22 0500  Phosphorus  Tomorrow morning,   R        12/18/22 1025   12/18/22 1908  Cryptococcal antigen  Once,   R  12/18/22 1907   12/18/22 1908  Histoplasma antigen, urine  Once,   R        12/18/22 1907   12/18/22 1908  Blastomyces Antigen  Once,   R        12/18/22 1907   12/18/22 1220  Pneumocystis smear by DFA  Once,   R       Question:  Patient immune status  Answer:  Immunocompromised   12/18/22 1219   12/18/22 1219  Acid Fast Smear (AFB)  (AFB smear + Culture w reflexed sensitivities panel)  Once,   R       Question:  Patient immune status  Answer:  Immunocompromised  Placed in "And" Linked Group   12/18/22 1219   12/18/22 1219  Acid Fast Culture with reflexed sensitivities  (AFB smear + Culture w reflexed sensitivities panel)  Once,   R       Question:  Patient immune status   Answer:  Immunocompromised  Placed in "And" Linked Group   12/18/22 1219   12/18/22 1218  Cholesterol, pleural fluid  (Bedside Thoracentesis)  Once,   R        12/18/22 1219   12/18/22 0235  Blood Culture (routine x 2)  (Septic presentation on arrival (screening labs, nursing and treatment orders for obvious sepsis))  BLOOD CULTURE X 2,   STAT      12/18/22 0235            Vitals/Pain Today's Vitals   12/18/22 1657 12/18/22 1845 12/18/22 1923 12/18/22 1930  BP:   (!) 144/102 (!) 146/92  Pulse:  98 95 93  Resp:  (!) 24 (!) 23 (!) 21  Temp:  99.1 F (37.3 C)    TempSrc:  Oral    SpO2:  100% 97% 97%  PainSc: 0-No pain       Isolation Precautions No active isolations  Medications Medications  ipratropium-albuterol (DUONEB) 0.5-2.5 (3) MG/3ML nebulizer solution 3 mL (has no administration in time range)  enoxaparin (LOVENOX) injection 40 mg (has no administration in time range)  acetaminophen (TYLENOL) tablet 650 mg (has no administration in time range)    Or  acetaminophen (TYLENOL) suppository 650 mg (has no administration in time range)  ondansetron (ZOFRAN) tablet 4 mg (has no administration in time range)    Or  ondansetron (ZOFRAN) injection 4 mg (has no administration in time range)  Ampicillin-Sulbactam (UNASYN) 3 g in sodium chloride 0.9 % 100 mL IVPB (0 g Intravenous Stopped 12/18/22 1849)  0.9 % NaCl with KCl 20 mEq/ L  infusion ( Intravenous New Bag/Given 12/18/22 1217)  bictegravir-emtricitabine-tenofovir AF (BIKTARVY) 50-200-25 MG per tablet 1 tablet (has no administration in time range)  lactated ringers bolus 1,000 mL (0 mLs Intravenous Stopped 12/18/22 0426)    And  lactated ringers bolus 250 mL (0 mLs Intravenous Stopped 12/18/22 0426)  fentaNYL (SUBLIMAZE) injection 50 mcg (50 mcg Intravenous Given 12/18/22 0256)  iohexol (OMNIPAQUE) 350 MG/ML injection 100 mL (100 mLs Intravenous Contrast Given 12/18/22 0303)  HYDROmorphone (DILAUDID) injection 0.5 mg (0.5 mg  Intravenous Given 12/18/22 0513)  potassium chloride SA (KLOR-CON M) CR tablet 40 mEq (40 mEq Oral Given 12/18/22 1215)  magnesium sulfate IVPB 2 g 50 mL (0 g Intravenous Stopped 12/18/22 1301)    Mobility walks

## 2022-12-19 ENCOUNTER — Other Ambulatory Visit (HOSPITAL_COMMUNITY): Payer: Self-pay

## 2022-12-19 DIAGNOSIS — J9859 Other diseases of mediastinum, not elsewhere classified: Secondary | ICD-10-CM | POA: Diagnosis not present

## 2022-12-19 LAB — BLOOD CULTURE ID PANEL (REFLEXED) - BCID2

## 2022-12-19 LAB — COMPREHENSIVE METABOLIC PANEL
ALT: 19 U/L (ref 0–44)
AST: 21 U/L (ref 15–41)
Albumin: 3.5 g/dL (ref 3.5–5.0)
Alkaline Phosphatase: 74 U/L (ref 38–126)
Anion gap: 10 (ref 5–15)
BUN: 6 mg/dL (ref 6–20)
CO2: 23 mmol/L (ref 22–32)
Calcium: 8.5 mg/dL — ABNORMAL LOW (ref 8.9–10.3)
Chloride: 105 mmol/L (ref 98–111)
Creatinine, Ser: 1.04 mg/dL (ref 0.61–1.24)
GFR, Estimated: >60 mL/min (ref >60)
Glucose, Bld: 107 mg/dL — ABNORMAL HIGH (ref 70–99)
Potassium: 4.1 mmol/L (ref 3.5–5.1)
Sodium: 138 mmol/L (ref 135–145)
Total Bilirubin: 1 mg/dL (ref 0.3–1.2)
Total Protein: 6.6 g/dL (ref 6.5–8.1)

## 2022-12-19 LAB — CRYPTOCOCCAL ANTIGEN
Crypto Ag: POSITIVE — AB
Cryptococcal Ag Titer: >=1:2560 {titer} — AB

## 2022-12-19 LAB — CBC
HCT: 46.6 % (ref 39.0–52.0)
Hemoglobin: 16.3 g/dL (ref 13.0–17.0)
MCH: 31.6 pg (ref 26.0–34.0)
MCHC: 35 g/dL (ref 30.0–36.0)
MCV: 90.3 fL (ref 80.0–100.0)
Platelets: 209 10*3/uL (ref 150–400)
RBC: 5.16 MIL/uL (ref 4.22–5.81)
RDW: 13.9 % (ref 11.5–15.5)
WBC: 6.2 10*3/uL (ref 4.0–10.5)
nRBC: 0 % (ref 0.0–0.2)

## 2022-12-19 LAB — PHOSPHORUS: Phosphorus: 2.5 mg/dL (ref 2.5–4.6)

## 2022-12-19 LAB — MAGNESIUM: Magnesium: 2.2 mg/dL (ref 1.7–2.4)

## 2022-12-19 MED ORDER — ORAL CARE MOUTH RINSE: 15.0000 mL | OROMUCOSAL | Status: DC | PRN

## 2022-12-19 MED ORDER — ALUM & MAG HYDROXIDE-SIMETH 200-200-20 MG/5ML PO SUSP
30.0000 mL | ORAL | Status: DC | PRN
  Administered 2022-12-19 – 2022-12-24 (×4): 30 mL via ORAL
  Filled 2022-12-19 (×4): qty 30

## 2022-12-19 MED ORDER — ENOXAPARIN SODIUM 40 MG/0.4ML IJ SOSY
40.0000 mg | PREFILLED_SYRINGE | INTRAMUSCULAR | Status: DC
  Administered 2022-12-20 – 2022-12-26 (×6): 40 mg via SUBCUTANEOUS
  Filled 2022-12-19 (×6): qty 0.4

## 2022-12-19 NOTE — Progress Notes (Signed)
[facility]   Reason for visit: Follow up on pulmonary mass  Interval History: now positive Cryptoccocal Ag at 1:2560; no growth on cultures to date.  WBC 6.2; day 2 antibiotics   Physical Exam: Constitutional:  Vitals:   12/19/22 0942 12/19/22 1439  BP: (!) 152/92 (!) 133/95  Pulse: 80 86  Resp:    Temp: 98.6 F (37 C) 98.6 F (37 C)  SpO2: 98% 98%   patient appears in NAD Respiratory: Normal respiratory effort  Review of Systems: Constitutional: negative for fevers and chills No headache  Lab Results  Component Value Date   WBC 6.2 12/19/2022   HGB 16.3 12/19/2022   HCT 46.6 12/19/2022   MCV 90.3 12/19/2022   PLT 209 12/19/2022    Lab Results  Component Value Date   CREATININE 1.04 12/19/2022   BUN 6 12/19/2022   NA 138 12/19/2022   K 4.1 12/19/2022   CL 105 12/19/2022   CO2 23 12/19/2022    Lab Results  Component Value Date   ALT 19 12/19/2022   AST 21 12/19/2022   ALKPHOS 74 12/19/2022     Microbiology: Recent Results (from the past 240 hour(s))  Resp panel by RT-PCR (RSV, Flu A&B, Covid) Anterior Nasal Swab     Status: None   Collection Time: 12/18/22  2:35 AM   Specimen: Anterior Nasal Swab  Result Value Ref Range Status   SARS Coronavirus 2 by RT PCR NEGATIVE NEGATIVE Final    Comment: (NOTE) SARS-CoV-2 target nucleic acids are NOT DETECTED.  The SARS-CoV-2 RNA is generally detectable in upper respiratory specimens during the acute phase of infection. The lowest concentration of SARS-CoV-2 viral copies this assay can detect is 138 copies/mL. A negative result does not preclude SARS-Cov-2 infection and should not be used as the sole basis for treatment or other patient management decisions. A negative result may occur with  improper specimen collection/handling, submission of specimen other than nasopharyngeal swab, presence of viral mutation(s) within the areas targeted by this assay, and inadequate number of  viral copies(<138 copies/mL). A negative result must be combined with clinical observations, patient history, and epidemiological information. The expected result is Negative.  Fact Sheet for Patients:  BloggerCourse.com  Fact Sheet for Healthcare Providers:  SeriousBroker.it  This test is no t yet approved or cleared by the [place] FDA and  has been authorized for detection and/or diagnosis of SARS-CoV-2 by FDA under an Emergency Use Authorization (EUA). This EUA will remain  in effect (meaning this test can be used) for the duration of the COVID-19 declaration under Section 564(b)(1) of the Act, 21 U.S.C.section 360bbb-3(b)(1), unless the authorization is terminated  or revoked sooner.       Influenza A by PCR NEGATIVE NEGATIVE Final   Influenza B by PCR NEGATIVE NEGATIVE Final    Comment: (NOTE) The Xpert Xpress SARS-CoV-2/FLU/RSV plus assay is intended as an aid in the diagnosis of influenza from Nasopharyngeal swab specimens and should not be used as a sole basis for treatment. Nasal washings and aspirates are unacceptable for Xpert Xpress SARS-CoV-2/FLU/RSV testing.  Fact Sheet for Patients: BloggerCourse.com  Fact Sheet for Healthcare Providers: SeriousBroker.it  This test is not yet approved or cleared by the [place] FDA and has been authorized for detection and/or diagnosis of SARS-CoV-2 by FDA under an Emergency Use Authorization (EUA). This EUA will remain in effect (meaning this test can be used) for the duration of the COVID-19 declaration under  Section 564(b)(1) of the Act, 21 U.S.C. section 360bbb-3(b)(1), unless the authorization is terminated or revoked.     Resp Syncytial Virus by PCR NEGATIVE NEGATIVE Final    Comment: (NOTE) Fact Sheet for Patients: BloggerCourse.com  Fact Sheet for Healthcare  Providers: SeriousBroker.it  This test is not yet approved or cleared by the [place] FDA and has been authorized for detection and/or diagnosis of SARS-CoV-2 by FDA under an Emergency Use Authorization (EUA). This EUA will remain in effect (meaning this test can be used) for the duration of the COVID-19 declaration under Section 564(b)(1) of the Act, 21 U.S.C. section 360bbb-3(b)(1), unless the authorization is terminated or revoked.  Performed at [facility], [address], [place], [place] [address]   Blood Culture (routine x 2)     Status: None (Preliminary result)   Collection Time: 12/18/22  2:50 AM   Specimen: BLOOD  Result Value Ref Range Status   Specimen Description   Final    BLOOD RIGHT ANTECUBITAL Performed at [facility], [address], [place], [place] [address]    Special Requests   Final    Blood Culture adequate volume BOTTLES DRAWN AEROBIC AND ANAEROBIC Performed at [facility], [address] [place], [place] [address]    Culture  Setup Time   Final    GRAM POSITIVE COCCI ANAEROBIC BOTTLE ONLY CRITICAL RESULT CALLED TO, READ BACK BY AND VERIFIED WITH: Wille Glaser LEGGE [id number] AT 0829 BY EC Performed at [facility], [address], [place], [place] [address]    Culture GRAM POSITIVE COCCI  Final   Report Status PENDING  Incomplete  Blood Culture ID Panel (Reflexed)     Status: Abnormal   Collection Time: 12/18/22  2:50 AM  Result Value Ref Range Status   Enterococcus faecalis NOT DETECTED NOT DETECTED Final   Enterococcus Faecium NOT DETECTED NOT DETECTED Final   Listeria monocytogenes NOT DETECTED NOT DETECTED Final   Staphylococcus species DETECTED (A) NOT DETECTED Final    Comment: CRITICAL RESULT CALLED TO, READ BACK BY AND VERIFIED WITH: PHARMD JUSTIN LEGGE [id number] AT 0829 BY EC    Staphylococcus aureus (BCID) NOT DETECTED NOT DETECTED Final    Staphylococcus epidermidis DETECTED (A) NOT DETECTED Final    Comment: CRITICAL RESULT CALLED TO, READ BACK BY AND VERIFIED WITH: PHARMD JUSTIN LEGGE [phone] 0829 BY EC    Staphylococcus lugdunensis NOT DETECTED NOT DETECTED Final   Streptococcus species NOT DETECTED NOT DETECTED Final   Streptococcus agalactiae NOT DETECTED NOT DETECTED Final   Streptococcus pneumoniae NOT DETECTED NOT DETECTED Final   Streptococcus pyogenes NOT DETECTED NOT DETECTED Final   A.calcoaceticus-baumannii NOT DETECTED NOT DETECTED Final   Bacteroides fragilis NOT DETECTED NOT DETECTED Final   Enterobacterales NOT DETECTED NOT DETECTED Final   Enterobacter cloacae complex NOT DETECTED NOT DETECTED Final   Escherichia coli NOT DETECTED NOT DETECTED Final   Klebsiella aerogenes NOT DETECTED NOT DETECTED Final   Klebsiella oxytoca NOT DETECTED NOT DETECTED Final   Klebsiella pneumoniae NOT DETECTED NOT DETECTED Final   Proteus species NOT DETECTED NOT DETECTED Final   Salmonella species NOT DETECTED NOT DETECTED Final   Serratia marcescens NOT DETECTED NOT DETECTED Final   Haemophilus influenzae NOT DETECTED NOT DETECTED Final   Neisseria meningitidis NOT DETECTED NOT DETECTED Final   Pseudomonas aeruginosa NOT DETECTED NOT DETECTED Final   Stenotrophomonas maltophilia NOT DETECTED NOT DETECTED Final   Candida albicans NOT DETECTED NOT DETECTED  Final   Candida auris NOT DETECTED NOT DETECTED Final   Candida glabrata NOT DETECTED NOT DETECTED Final   Candida krusei NOT DETECTED NOT DETECTED Final   Candida parapsilosis NOT DETECTED NOT DETECTED Final   Candida tropicalis NOT DETECTED NOT DETECTED Final   Cryptococcus neoformans/gattii NOT DETECTED NOT DETECTED Final   Methicillin resistance mecA/C NOT DETECTED NOT DETECTED Final    Comment: Performed at [facility], [address] [place], [place] [address]  Blood Culture (routine x 2)     Status: None (Preliminary result)   Collection Time:  12/18/22  2:55 AM   Specimen: BLOOD  Result Value Ref Range Status   Specimen Description   Final    BLOOD LEFT ANTECUBITAL Performed at [facility], [address] [place], [place] [address]    Special Requests   Final    Blood Culture adequate volume BOTTLES DRAWN AEROBIC AND ANAEROBIC Performed at [facility], [address], [place], [place] [address]    Culture   Final    NO GROWTH 1 DAY Performed at [facility], [address] [place], [place] [address]    Report Status PENDING  Incomplete  Pleural fluid culture w Gram Stain     Status: None (Preliminary result)   Collection Time: 12/18/22  1:12 PM   Specimen: Pleural Fluid  Result Value Ref Range Status   Specimen Description   Final    PLEURAL Performed at [facility], [address] [place], [place] [address]    Special Requests   Final    Immunocompromised Performed at [facility], [address], [place], [place] [address]    Gram Stain   Final    FEW WBC PRESENT, PREDOMINANTLY MONONUCLEAR NO ORGANISMS SEEN    Culture   Final    NO GROWTH < 24 HOURS Performed at [facility], [address], [place], [place] [address]    Report Status PENDING  Incomplete    Impression/Plan:  1. Positive cryptococcal Ag - Fortunately he is not having any CNS symptoms at this time but with a positive serum crypcotoccal Ag at a high level, this is certainly concerning for CNS infection.  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  I suspect this is an early detection.  At this point, I recommend a CT scan of his head which I have ordered, then will need a lumbar puncture to include cell count, gram stain and cultures, glucose and protein and with an opening pressure.    2.  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   Will continue to monitor  3.  HIV - has been poorly compliant overall but his CD4 count has been relatively good. On Biktarvy.  Less chance of IRIS if CSF Cryptococcal Ag positive with a higher CD4 but will need to watch for any signs of Iris.

## 2022-12-19 NOTE — Progress Notes (Signed)
PHARMACY - PHYSICIAN COMMUNICATION CRITICAL VALUE ALERT - BLOOD CULTURE IDENTIFICATION (BCID)  Christopher Henson is an 42 y.o. male who presented to [facility] on 12/18/2022 with a chief complaint of CP.  Assessment:  CT angio = mediastinal mass concerning for malignancy, left upper lobe pulmonary nodule, opacticies in left upper lobe which may represent infection/inflammation v. Atelectasis. Started on abx empirically  Name of physician (or Provider) ContactedAlanda Slim  Current antibiotics: Unasyn  Changes to prescribed antibiotics recommended:  Patient is on recommended antibiotics - No changes needed. Likely culture contaminant  Results for orders placed or performed during the hospital encounter of 12/18/22  Blood Culture ID Panel (Reflexed) (Collected: 12/18/2022  2:50 AM)  Result Value Ref Range   Enterococcus faecalis NOT DETECTED NOT DETECTED   Enterococcus Faecium NOT DETECTED NOT DETECTED   Listeria monocytogenes NOT DETECTED NOT DETECTED   Staphylococcus species DETECTED (A) NOT DETECTED   Staphylococcus aureus (BCID) NOT DETECTED NOT DETECTED   Staphylococcus epidermidis DETECTED (A) NOT DETECTED   Staphylococcus lugdunensis NOT DETECTED NOT DETECTED   Streptococcus species NOT DETECTED NOT DETECTED   Streptococcus agalactiae NOT DETECTED NOT DETECTED   Streptococcus pneumoniae NOT DETECTED NOT DETECTED   Streptococcus pyogenes NOT DETECTED NOT DETECTED   A.calcoaceticus-baumannii NOT DETECTED NOT DETECTED   Bacteroides fragilis NOT DETECTED NOT DETECTED   Enterobacterales NOT DETECTED NOT DETECTED   Enterobacter cloacae complex NOT DETECTED NOT DETECTED   Escherichia coli NOT DETECTED NOT DETECTED   Klebsiella aerogenes NOT DETECTED NOT DETECTED   Klebsiella oxytoca NOT DETECTED NOT DETECTED   Klebsiella pneumoniae NOT DETECTED NOT DETECTED   Proteus species NOT DETECTED NOT DETECTED   Salmonella species NOT DETECTED NOT DETECTED   Serratia marcescens NOT DETECTED NOT  DETECTED   Haemophilus influenzae NOT DETECTED NOT DETECTED   Neisseria meningitidis NOT DETECTED NOT DETECTED   Pseudomonas aeruginosa NOT DETECTED NOT DETECTED   Stenotrophomonas maltophilia NOT DETECTED NOT DETECTED   Candida albicans NOT DETECTED NOT DETECTED   Candida auris NOT DETECTED NOT DETECTED   Candida glabrata NOT DETECTED NOT DETECTED   Candida krusei NOT DETECTED NOT DETECTED   Candida parapsilosis NOT DETECTED NOT DETECTED   Candida tropicalis NOT DETECTED NOT DETECTED   Cryptococcus neoformans/gattii NOT DETECTED NOT DETECTED   Methicillin resistance mecA/C NOT DETECTED NOT DETECTED   Kathleene Bergemann S. Merilynn [place], PharmD, BCPS Clinical Staff Pharmacist [email or web address] Misty Stanley Stillinger 12/19/2022  9:00 AM

## 2022-12-19 NOTE — Progress Notes (Signed)
PROGRESS NOTE  Christopher Henson ZOX:[id number] DOB: [birth year]   PCP: [name], PA-C  Patient is from: Home.  DOA: 12/18/2022 LOS: 1  Chief complaints Chief Complaint  Patient presents with   Chest Pain     Brief Narrative / Interim history: 42 year old M with PMH of HIV/AIDS (last CD4 count 258 and viral load 89 on 9/5), cryptococcal meningitis, alcohol use and tobacco use presenting with chest pain, shortness of breath, generalized weakness and unintentional weight loss, and admitted for anterior mediastinal mass.  Patient stated he has not been able to take his antiviral medications for about a week due to cost.  He was seen in ED on 9/3 and had chest x-ray that was concerning for mediastinal mass.  He was recommended to have CT but he left AMA.  CT angio chest negative for PE but 8 x 7.5 cm anterior mediastinal mass, 1.6 x 1.2 cm LUL nodule, interval increase in peribronchovascular groundglass airspace opacities within LUL and interval increase in size of small left pleural effusion.  CT abdomen and pelvis without significant finding.  Pulmonology and infectious disease consulted.  He underwent left thoracocentesis with removal of 800 cc exudative fluid.  Blood cultures and pleural fluid cultures sent.  Started on IV Unasyn.  Pulmonology consulted IR for targeted biopsy  Subjective: Seen and examined earlier this morning.  No major events overnight of this morning.  No complaints other than some intermittent cough.  No significant shortness of breath or chest pain.  Denies GI symptoms.  Admits to smoking about a pack a day.  Not interested in nicotine patch.  Objective: Vitals:   12/19/22 0113 12/19/22 0517 12/19/22 0927 12/19/22 0942  BP: 130/89 (!) 140/105 (!) 141/98 (!) 152/92  Pulse: 82 83 79 80  Resp: 20 20 20    Temp: 98.8 F (37.1 C) 98.6 F (37 C) 98.5 F (36.9 C) 98.6 F (37 C)  TempSrc: Oral Oral Oral Oral  SpO2: 96% 97% 98% 98%  Weight:      Height:         Examination:  GENERAL: No apparent distress.  Nontoxic. HEENT: MMM.  Vision and hearing grossly intact.  NECK: Supple.  No apparent JVD.  RESP:  No IWOB.  Fair aeration bilaterally. CVS:  RRR. Heart sounds normal.  ABD/GI/GU: BS+. Abd soft, NTND.  MSK/EXT:  Moves extremities. No apparent deformity. No edema.  SKIN: no apparent skin lesion or wound NEURO: Awake, alert and oriented appropriately.  No apparent focal neuro deficit. PSYCH: Calm. Normal affect.   Procedures:  9/9-thoracocentesis with removal of 800 cc exudative fluid  Microbiology summarized: 9/9-COVID-19, influenza and RSV PCR nonreactive 9/9-blood culture with staph epidermis in 1 out of 4 bottles 9/9-pleural fluid cultures NGTD  Assessment and plan: Principal Problem:   Mediastinal mass Active Problems:   HIV (human immunodeficiency virus infection) (HCC)   Pleural effusion on left   Hypokalemia   Alcoholism (HCC)   Smoker   Anterior mediastinal mass: CT chest showed 8 x 7.5 cm anterior mediastinal mass LUL nodule-CT showed 1.6 x 1.2 cm LUL nodule. Left pleural effusion-s/p thoracocentesis with removal of 800 cc exudative fluid.  Pleural fluid culture NGTD Endobronchial narrowing due to extrinsic mass effect Adenopathy Peribronchovascular groundglass airspace opacities in LUL -Concern for malignancy given patient's smoking history.  Pleural fluid study with lymphocytosis. -Appreciate recs by pulmonology-IR biopsy of mediastinal mass in house and PET scanning outpatient -Follow cytology and other pleural fluid studies. -Continue IV Unasyn -Follow-up pleural fluid  studies   HIV/AIDS: last CD4 count 258 and viral load 89 on 9/5.  Noncompliance.  Reportedly not will to obtain meds for over a week due to cost -ID following.  Restarted Biktarvy. -TOC consulted.  Tobacco use disorder: Reports smoking about a pack a day -Encouraged smoking cessation -Patient declined nicotine patch  Alcohol use  disorder: No withdrawal symptoms. -Closely monitor.  Noncompliance -Counseled.   Hypokalemia: Resolved  Positive blood culture: Blood culture with Staph epidermidis in 1 out of 4 bottles likely contaminant  Body mass index is 23.59 kg/m.           DVT prophylaxis:  enoxaparin (LOVENOX) injection 40 mg Start: 12/18/22 2200  Code Status: Full code Family Communication: None at bedside Level of care: Progressive Status is: Inpatient Remains inpatient appropriate because: Anterior mediastinal mass,   Final disposition: Home Consultants:  Pulmonology Infectious disease Interventional radiology  55 minutes with more than 50% spent in reviewing records, counseling patient/family and coordinating care.   Sch Meds:  Scheduled Meds:  bictegravir-emtricitabine-tenofovir AF  1 tablet Oral Daily   enoxaparin (LOVENOX) injection  40 mg Subcutaneous Q24H   Continuous Infusions:  ampicillin-sulbactam (UNASYN) IV 3 g (12/19/22 0521)   PRN Meds:.acetaminophen **OR** acetaminophen, alum & mag hydroxide-simeth, ipratropium-albuterol, ondansetron **OR** ondansetron (ZOFRAN) IV, mouth rinse  Antimicrobials: Anti-infectives (From admission, onward)    Start     Dose/Rate Route Frequency Ordered Stop   12/18/22 2300  bictegravir-emtricitabine-tenofovir AF (BIKTARVY) 50-200-25 MG per tablet 1 tablet        1 tablet Oral Daily 12/18/22 1910     12/18/22 1100  Ampicillin-Sulbactam (UNASYN) 3 g in sodium chloride 0.9 % 100 mL IVPB        3 g 200 mL/hr over 30 Minutes Intravenous Every 6 hours 12/18/22 1054     12/18/22 0500  sulfamethoxazole-trimethoprim (BACTRIM) 415 mg of trimethoprim in dextrose 5 % 500 mL IVPB  Status:  Discontinued        415 mg of trimethoprim 350.6 mL/hr over 90 Minutes Intravenous Every 6 hours 12/18/22 0427 12/18/22 1051        I have personally reviewed the following labs and images: CBC: Recent Labs  Lab 12/14/22 1132 12/18/22 0226 12/18/22 0251  12/19/22 0904  WBC 6.8 11.2*  --  6.2  NEUTROABS 4,474  --   --   --   HGB 16.4 16.3 16.7 16.3  HCT 48.1 45.3 49.0 46.6  MCV 93.8 89.7  --  90.3  PLT 209 220  --  209   BMP &GFR Recent Labs  Lab 12/14/22 1132 12/18/22 0226 12/18/22 0251 12/19/22 0904  NA 141 136 139 138  K 3.8 3.1* 3.4* 4.1  CL 105 103 105 105  CO2 27 24  --  23  GLUCOSE 84 133* 123* 107*  BUN 7 12 12 6   CREATININE 0.89 1.06 1.20 1.04  CALCIUM 9.4 8.5*  --  8.5*  MG  --   --   --  2.2  PHOS  --   --   --  2.5   Estimated Creatinine Clearance: 99.5 mL/min (by C-G formula based on SCr of 1.04 mg/dL). Liver & Pancreas: Recent Labs  Lab 12/14/22 1132 12/18/22 0226 12/18/22 1312 12/19/22 0904  AST 19 24  --  21  ALT 13 19  --  19  ALKPHOS  --  77  --  74  BILITOT 0.5 1.0  --  1.0  PROT 6.5 6.9 6.7 6.6  ALBUMIN  --  3.7  --  3.5   No results for input(s): "LIPASE", "AMYLASE" in the last 168 hours. No results for input(s): "AMMONIA" in the last 168 hours. Diabetic: No results for input(s): "HGBA1C" in the last 72 hours. No results for input(s): "GLUCAP" in the last 168 hours. Cardiac Enzymes: No results for input(s): "CKTOTAL", "CKMB", "CKMBINDEX", "TROPONINI" in the last 168 hours. No results for input(s): "PROBNP" in the last 8760 hours. Coagulation Profile: Recent Labs  Lab 12/18/22 0226  INR 1.0   Thyroid Function Tests: No results for input(s): "TSH", "T4TOTAL", "FREET4", "T3FREE", "THYROIDAB" in the last 72 hours. Lipid Profile: Recent Labs    12/18/22 1312  CHOL 171   Anemia Panel: No results for input(s): "VITAMINB12", "FOLATE", "FERRITIN", "TIBC", "IRON", "RETICCTPCT" in the last 72 hours. Urine analysis:    Component Value Date/Time   COLORURINE STRAW (A) 12/18/2022 0552   APPEARANCEUR CLEAR 12/18/2022 0552   LABSPEC 1.025 12/18/2022 0552   PHURINE 7.0 12/18/2022 0552   GLUCOSEU NEGATIVE 12/18/2022 0552   HGBUR NEGATIVE 12/18/2022 0552   BILIRUBINUR NEGATIVE 12/18/2022  0552   KETONESUR NEGATIVE 12/18/2022 0552   PROTEINUR NEGATIVE 12/18/2022 0552   NITRITE NEGATIVE 12/18/2022 0552   LEUKOCYTESUR NEGATIVE 12/18/2022 0552   Sepsis Labs: Invalid input(s): "PROCALCITONIN", "LACTICIDVEN"  Microbiology: Recent Results (from the past 240 hour(s))  Resp panel by RT-PCR (RSV, Flu A&B, Covid) Anterior Nasal Swab     Status: None   Collection Time: 12/18/22  2:35 AM   Specimen: Anterior Nasal Swab  Result Value Ref Range Status   SARS Coronavirus 2 by RT PCR NEGATIVE NEGATIVE Final    Comment: (NOTE) SARS-CoV-2 target nucleic acids are NOT DETECTED.  The SARS-CoV-2 RNA is generally detectable in upper respiratory specimens during the acute phase of infection. The lowest concentration of SARS-CoV-2 viral copies this assay can detect is 138 copies/mL. A negative result does not preclude SARS-Cov-2 infection and should not be used as the sole basis for treatment or other patient management decisions. A negative result may occur with  improper specimen collection/handling, submission of specimen other than nasopharyngeal swab, presence of viral mutation(s) within the areas targeted by this assay, and inadequate number of viral copies(<138 copies/mL). A negative result must be combined with clinical observations, patient history, and epidemiological information. The expected result is Negative.  Fact Sheet for Patients:  BloggerCourse.com  Fact Sheet for Healthcare Providers:  SeriousBroker.it  This test is no t yet approved or cleared by the [place] FDA and  has been authorized for detection and/or diagnosis of SARS-CoV-2 by FDA under an Emergency Use Authorization (EUA). This EUA will remain  in effect (meaning this test can be used) for the duration of the COVID-19 declaration under Section 564(b)(1) of the Act, 21 U.S.C.section 360bbb-3(b)(1), unless the authorization is terminated  or revoked  sooner.       Influenza A by PCR NEGATIVE NEGATIVE Final   Influenza B by PCR NEGATIVE NEGATIVE Final    Comment: (NOTE) The Xpert Xpress SARS-CoV-2/FLU/RSV plus assay is intended as an aid in the diagnosis of influenza from Nasopharyngeal swab specimens and should not be used as a sole basis for treatment. Nasal washings and aspirates are unacceptable for Xpert Xpress SARS-CoV-2/FLU/RSV testing.  Fact Sheet for Patients: BloggerCourse.com  Fact Sheet for Healthcare Providers: SeriousBroker.it  This test is not yet approved or cleared by the [place] FDA and has been authorized for detection and/or diagnosis of SARS-CoV-2 by FDA under an Emergency Use Authorization (EUA).  This EUA will remain in effect (meaning this test can be used) for the duration of the COVID-19 declaration under Section 564(b)(1) of the Act, 21 U.S.C. section 360bbb-3(b)(1), unless the authorization is terminated or revoked.     Resp Syncytial Virus by PCR NEGATIVE NEGATIVE Final    Comment: (NOTE) Fact Sheet for Patients: BloggerCourse.com  Fact Sheet for Healthcare Providers: SeriousBroker.it  This test is not yet approved or cleared by the [place] FDA and has been authorized for detection and/or diagnosis of SARS-CoV-2 by FDA under an Emergency Use Authorization (EUA). This EUA will remain in effect (meaning this test can be used) for the duration of the COVID-19 declaration under Section 564(b)(1) of the Act, 21 U.S.C. section 360bbb-3(b)(1), unless the authorization is terminated or revoked.  Performed at [facility], [address], [place], [place] [address]   Blood Culture (routine x 2)     Status: None (Preliminary result)   Collection Time: 12/18/22  2:50 AM   Specimen: BLOOD  Result Value Ref Range Status   Specimen Description   Final    BLOOD RIGHT  ANTECUBITAL Performed at [facility], [address], [place], [place] [address]    Special Requests   Final    Blood Culture adequate volume BOTTLES DRAWN AEROBIC AND ANAEROBIC Performed at [facility], [address], [place], [place] [address]    Culture  Setup Time   Final    GRAM POSITIVE COCCI ANAEROBIC BOTTLE ONLY CRITICAL RESULT CALLED TO, READ BACK BY AND VERIFIED WITH: Wille Glaser LEGGE [id number] AT 0829 BY EC Performed at [facility], [address], [place], [place] [address]    Culture GRAM POSITIVE COCCI  Final   Report Status PENDING  Incomplete  Blood Culture ID Panel (Reflexed)     Status: Abnormal   Collection Time: 12/18/22  2:50 AM  Result Value Ref Range Status   Enterococcus faecalis NOT DETECTED NOT DETECTED Final   Enterococcus Faecium NOT DETECTED NOT DETECTED Final   Listeria monocytogenes NOT DETECTED NOT DETECTED Final   Staphylococcus species DETECTED (A) NOT DETECTED Final    Comment: CRITICAL RESULT CALLED TO, READ BACK BY AND VERIFIED WITH: PHARMD JUSTIN LEGGE [id number] AT 0829 BY EC    Staphylococcus aureus (BCID) NOT DETECTED NOT DETECTED Final   Staphylococcus epidermidis DETECTED (A) NOT DETECTED Final    Comment: CRITICAL RESULT CALLED TO, READ BACK BY AND VERIFIED WITH: PHARMD JUSTIN LEGGE [id number] 0829 BY EC    Staphylococcus lugdunensis NOT DETECTED NOT DETECTED Final   Streptococcus species NOT DETECTED NOT DETECTED Final   Streptococcus agalactiae NOT DETECTED NOT DETECTED Final   Streptococcus pneumoniae NOT DETECTED NOT DETECTED Final   Streptococcus pyogenes NOT DETECTED NOT DETECTED Final   A.calcoaceticus-baumannii NOT DETECTED NOT DETECTED Final   Bacteroides fragilis NOT DETECTED NOT DETECTED Final   Enterobacterales NOT DETECTED NOT DETECTED Final   Enterobacter cloacae complex NOT DETECTED NOT DETECTED Final   Escherichia coli NOT DETECTED NOT DETECTED Final   Klebsiella aerogenes  NOT DETECTED NOT DETECTED Final   Klebsiella oxytoca NOT DETECTED NOT DETECTED Final   Klebsiella pneumoniae NOT DETECTED NOT DETECTED Final   Proteus species NOT DETECTED NOT DETECTED Final   Salmonella species NOT DETECTED NOT DETECTED Final   Serratia marcescens NOT DETECTED NOT DETECTED Final   Haemophilus influenzae NOT DETECTED NOT DETECTED Final   Neisseria meningitidis NOT DETECTED NOT DETECTED Final   Pseudomonas aeruginosa NOT DETECTED  NOT DETECTED Final   Stenotrophomonas maltophilia NOT DETECTED NOT DETECTED Final   Candida albicans NOT DETECTED NOT DETECTED Final   Candida auris NOT DETECTED NOT DETECTED Final   Candida glabrata NOT DETECTED NOT DETECTED Final   Candida krusei NOT DETECTED NOT DETECTED Final   Candida parapsilosis NOT DETECTED NOT DETECTED Final   Candida tropicalis NOT DETECTED NOT DETECTED Final   Cryptococcus neoformans/gattii NOT DETECTED NOT DETECTED Final   Methicillin resistance mecA/C NOT DETECTED NOT DETECTED Final    Comment: Performed at [facility], [address], [place], [place] [address]  Blood Culture (routine x 2)     Status: None (Preliminary result)   Collection Time: 12/18/22  2:55 AM   Specimen: BLOOD  Result Value Ref Range Status   Specimen Description   Final    BLOOD LEFT ANTECUBITAL Performed at [facility], [address] [place], [place] [address]    Special Requests   Final    Blood Culture adequate volume BOTTLES DRAWN AEROBIC AND ANAEROBIC Performed at [facility], [address], [place], [place] [address]    Culture   Final    NO GROWTH 1 DAY Performed at [facility], [address] [place], [place] [address]    Report Status PENDING  Incomplete  Pleural fluid culture w Gram Stain     Status: None (Preliminary result)   Collection Time: 12/18/22  1:12 PM   Specimen: Pleural Fluid  Result Value Ref Range Status   Specimen Description   Final     PLEURAL Performed at [facility], [address], [place], [place] [address]    Special Requests   Final    Immunocompromised Performed at [facility], [address], [place], [place] [address]    Gram Stain   Final    FEW WBC PRESENT, PREDOMINANTLY MONONUCLEAR NO ORGANISMS SEEN    Culture   Final    NO GROWTH < 24 HOURS Performed at [facility], [address], [place], [place] [address]    Report Status PENDING  Incomplete    Radiology Studies: DG Chest Port 1 View  Result Date: 12/18/2022 CLINICAL DATA:  Status post thoracentesis. EXAM: PORTABLE CHEST 1 VIEW COMPARISON:  CT earlier 12/18/2022 FINDINGS: Persistent anterior mediastinal mass centered left of midline. Tiny left effusion. No pneumothorax. Mild left lung base opacity. Right lung is grossly clear. Normal cardiac silhouette. No edema. Overlapping cardiac leads. IMPRESSION: Known mediastinal mass. Tiny residual left effusion. No pneumothorax. Electronically Signed   By: Karen Kays M.D.   On: 12/18/2022 15:55      Jariyah Hackley T. Mayu Ronk [facility]  If 7PM-7AM, please contact night-coverage [email or web address] 12/19/2022, 11:53 AM

## 2022-12-19 NOTE — Progress Notes (Signed)
NAME:  Christopher Henson, MRN:  [id number], DOB:  08-07-1980, LOS: 1 ADMISSION DATE:  12/18/2022, CONSULTATION DATE: 12/18/2022 REFERRING MD: Dr. Sanda Klein, CHIEF COMPLAINT: Pleural effusion  History of Present Illness:  42 year old male with a past medical history of HIV/AIDS (last CD4 count 258, HIV quant 89 on 12/14/2022), alcohol use disorder, protein calorie malnutrition, history of cryptococcal meningitis who presented to the emergency department on 12/18/2022 with chest pain and shortness of breath.  On arrival he states that the symptoms had began the night before.  It was noted that he was seen in the emergency department recently on 12/12/2022 and had a chest x-ray that was concerning for a mediastinal mass.  He had a CT but he left AMA.  Today in the emergency department he had repeated CT imaging showed no pulmonary embolus.  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  There was associated mass effect on the left upper lobe pulmonary artery.  Also noted to have a left upper lobe pulmonary nodule that is 1.6 x 1.2 cm.  Noted to have a left-sided pleural effusion.  He did have a CT abdomen pelvis as well which showed no metastatic disease in the abdomen or pelvis.  Labs with WBC 11.2, K of 3.1, negative respiratory panel, lactic 2.7, negative troponin.  He was admitted to hospitalist.  Hospitalist consulted PCCM for thoracentesis of the left pleural effusion.  On my interview, the patient states that he has not had his Prezcobix medication in 2 months and has not been able to take his Biktarvy over the past 2 weeks related to cost and insurance.  He notes that he has felt mildly short of breath and has had progressively worsening symptoms over the past 2 weeks.  Also notes that he has had about 10 to 15 pounds weight loss over the past 1 to 2 months, night sweats.  Denies having overt fevers, diarrhea.  Has not noticed any lymphadenopathy.  Denies any new cough or sputum  production, sick contacts, recent travel.   Pertinent  Medical History  HIV/AIDS (last CD4 count 258, HIV quant 89 on 12/14/2022), alcohol use disorder, protein calorie malnutrition, history of cryptococcal meningitis   Significant Hospital Events: Including procedures, antibiotic start and stop dates in addition to other pertinent events   12/18/22 underwent thoracentesis 12/18/22 Chest CT with large mediastinal mass 8 x 7.5, hilar adenopathy, narrowing of the left upper lobe, infiltrate  Interim History / Subjective:  No overnight events Denies any pain or discomfort   Objective   Blood pressure (!) 141/98, pulse 79, temperature 98.5 F (36.9 C), temperature source Oral, resp. rate 20, height 6' (1.829 m), weight 78.9 kg, SpO2 98%.        Intake/Output Summary (Last 24 hours) at 12/19/2022 0935 Last data filed at 12/19/2022 0500 Gross per 24 hour  Intake 2071.24 ml  Output 400 ml  Net 1671.24 ml   Filed Weights   12/18/22 2115  Weight: 78.9 kg    Examination: General: Middle-age, does not appear to be in distress HENT: Moist oral mucosa Lungs: Decreased air movement at left base Cardiovascular: S1-S2 appreciated Abdomen: Soft, bowel sounds appreciated Extremities: No clubbing, no edema Neuro: Alert and oriented x 3 GU:   Resolved Hospital Problem list     Assessment & Plan:  S/p thoracentesis for left pleural effusion -Exudative effusion Cytology pending, no organisms and fluid  Mediastinal mass with adenopathy -Will involve interventional radiology to assist with  biopsy of the mass and possible right axillary adenopathy  Concern for endobronchial narrowing And extrinsic mass lesion Adenopathy -Will schedule for bronchoscopy with EBUS  HIV infection -Infectious disease continues to follow  Alcohol use disorder  Will try and schedule bronchoscopy Continue to follow pleural fluid analysis IR contacted for possible mediastinal mass biopsy  Discussed with Dr.  Hinton Lovely, MD [place] PCCM Pager: See Loretha Stapler

## 2022-12-19 NOTE — Plan of Care (Signed)
  Problem: Safety: Goal: Ability to remain free from injury will improve Outcome: Progressing   Problem: Pain Managment: Goal: General experience of comfort will improve Outcome: Progressing   Problem: Coping: Goal: Level of anxiety will decrease Outcome: Progressing   

## 2022-12-20 ENCOUNTER — Inpatient Hospital Stay (HOSPITAL_COMMUNITY): Payer: 59

## 2022-12-20 ENCOUNTER — Other Ambulatory Visit: Payer: Self-pay

## 2022-12-20 DIAGNOSIS — B20 Human immunodeficiency virus [HIV] disease: Secondary | ICD-10-CM | POA: Diagnosis not present

## 2022-12-20 DIAGNOSIS — J9859 Other diseases of mediastinum, not elsewhere classified: Secondary | ICD-10-CM | POA: Diagnosis not present

## 2022-12-20 LAB — RENAL FUNCTION PANEL
Albumin: 4 g/dL (ref 3.5–5.0)
Anion gap: 10 (ref 5–15)
BUN: 9 mg/dL (ref 6–20)
CO2: 27 mmol/L (ref 22–32)
Calcium: 9 mg/dL (ref 8.9–10.3)
Chloride: 102 mmol/L (ref 98–111)
Creatinine, Ser: 1.16 mg/dL (ref 0.61–1.24)
GFR, Estimated: >60 mL/min (ref >60)
Glucose, Bld: 96 mg/dL (ref 70–99)
Phosphorus: 3.1 mg/dL (ref 2.5–4.6)
Potassium: 3.9 mmol/L (ref 3.5–5.1)
Sodium: 139 mmol/L (ref 135–145)

## 2022-12-20 LAB — MAGNESIUM: Magnesium: 2.3 mg/dL (ref 1.7–2.4)

## 2022-12-20 LAB — CBC
HCT: 52.1 % — ABNORMAL HIGH (ref 39.0–52.0)
Hemoglobin: 18.2 g/dL — ABNORMAL HIGH (ref 13.0–17.0)
MCH: 31.9 pg (ref 26.0–34.0)
MCHC: 34.9 g/dL (ref 30.0–36.0)
MCV: 91.2 fL (ref 80.0–100.0)
Platelets: 220 10*3/uL (ref 150–400)
RBC: 5.71 MIL/uL (ref 4.22–5.81)
RDW: 14 % (ref 11.5–15.5)
WBC: 6.4 10*3/uL (ref 4.0–10.5)
nRBC: 0 % (ref 0.0–0.2)

## 2022-12-20 LAB — ACID FAST SMEAR (AFB, MYCOBACTERIA): Acid Fast Smear: NEGATIVE

## 2022-12-20 NOTE — Progress Notes (Addendum)
PROGRESS NOTE  Christopher Henson YHC:[id number] DOB: Aug 16, 1980   PCP: [name], PA-C  Patient is from: Home.  DOA: 12/18/2022 LOS: 2  Chief complaints Chief Complaint  Patient presents with   Chest Pain     Brief Narrative / Interim history: 42 year old M with PMH of HIV/AIDS,  cryptococcal meningitis, alcohol use and tobacco use presenting with chest pain, shortness of breath, generalized weakness and unintentional weight loss, and admitted for anterior mediastinal mass.  Patient stated he has not been able to take his antiviral medications for about a week due to cost.  He was seen in ED on 9/3 and had chest x-ray that was concerning for mediastinal mass.  He was recommended to have CT but he left AMA.    In ED, CT angio chest negative for PE but 8 x 7.5 cm anterior mediastinal mass, 1.6 x 1.2 cm LUL nodule, interval increase in peribronchovascular groundglass airspace opacities within LUL and interval increase in size of small left pleural effusion.  CT abdomen and pelvis without significant finding.  Pulmonology and infectious disease consulted.  He underwent left thoracocentesis with removal of 800 cc exudative fluid.  Blood culture with staph epidermis in 1 out of 4 bottles likely contaminant.  Pleural fluid culture NGTD.  Cytology with malignant cells.  Also elevated cryptococcal antigen.   Pulmonology planning bronchoscopy on 9/12.  ID recommended CT head then lumbar puncture.  Currently on IV Unasyn.  Subjective: Seen and examined earlier this morning.  No major events overnight of this morning.  Continues to endorse some disc found in his chest.  No other complaints.  Objective: Vitals:   12/19/22 1439 12/19/22 2104 12/20/22 0505 12/20/22 1257  BP: (!) 133/95 (!) 158/96 (!) 135/95 (!) 125/91  Pulse: 86 86 83 82  Resp:  20 18   Temp: 98.6 F (37 C) 98.4 F (36.9 C) 98.3 F (36.8 C) 98.7 F (37.1 C)  TempSrc: Oral Oral  Oral  SpO2: 98% 97% 96% 98%  Weight:       Height:        Examination:  GENERAL: No apparent distress.  Nontoxic. HEENT: MMM.  Vision and hearing grossly intact.  NECK: Supple.  No apparent JVD.  RESP:  No IWOB.  Fair aeration bilaterally. CVS:  RRR. Heart sounds normal.  ABD/GI/GU: BS+. Abd soft, NTND.  MSK/EXT:  Moves extremities. No apparent deformity. No edema.  SKIN: no apparent skin lesion or wound NEURO: Awake, alert and oriented appropriately.  No apparent focal neuro deficit. PSYCH: Calm. Normal affect.   Procedures:  9/9-thoracocentesis with removal of 800 cc exudative fluid  Microbiology summarized: 9/9-COVID-19, influenza and RSV PCR nonreactive 9/9-blood culture with staph epidermis in 1 out of 4 bottles 9/9-pleural fluid cultures NGTD  Assessment and plan: Principal Problem:   Mediastinal mass Active Problems:   HIV (human immunodeficiency virus infection) (HCC)   Pleural effusion on left   Hypokalemia   Alcoholism (HCC)   Smoker   Anterior mediastinal mass: CT chest showed 8 x 7.5 cm anterior mediastinal mass LUL nodule-CT showed 1.6 x 1.2 cm LUL nodule. Left pleural effusion-s/p thora with removal of 800 cc exudative fluid. Culture NGTD.  Cytology with malignant cells Endobronchial narrowing due to extrinsic mass effect Lymphadenopathy Peribronchovascular groundglass airspace opacities in LUL Acute hypoxic respiratory failure: Resolved -Pulmonology planning bronchoscopy on 9/12. -Continue IV Unasyn -Encourage incentive spirometry  HIV/AIDS: last CD4 count 258 and viral load 89 on 9/5.  Noncompliance.  Reportedly not will to  obtain meds for over a week due to cost -ID following.  Restarted Biktarvy. -TOC consulted.  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 -Defer to ID-recommend CT head then lumbar puncture.  Follow CT head.  Tobacco use disorder: Reports smoking about a pack a day -Encouraged smoking cessation -Patient declined nicotine  patch  Alcohol use disorder: No withdrawal symptoms. -Closely monitor.  Noncompliance -Counseled.   Hypokalemia: Resolved  Positive blood culture: Blood culture with Staph epidermidis in 1 out of 4 bottles likely contaminant  Body mass index is 23.59 kg/m.           DVT prophylaxis:  enoxaparin (LOVENOX) injection 40 mg Start: 12/20/22 2200  Code Status: Full code Family Communication: None at bedside Level of care: Med-Surg Status is: Inpatient Remains inpatient appropriate because: Anterior mediastinal mass concerning for malignancy, positive cryptococcal antigen   Final disposition: Home Consultants:  Pulmonology Infectious disease Interventional radiology  55 minutes with more than 50% spent in reviewing records, counseling patient/family and coordinating care.   Sch Meds:  Scheduled Meds:  bictegravir-emtricitabine-tenofovir AF  1 tablet Oral Daily   enoxaparin (LOVENOX) injection  40 mg Subcutaneous Q24H   Continuous Infusions:  ampicillin-sulbactam (UNASYN) IV 3 g (12/20/22 0528)   PRN Meds:.acetaminophen **OR** acetaminophen, alum & mag hydroxide-simeth, ipratropium-albuterol, ondansetron **OR** ondansetron (ZOFRAN) IV, mouth rinse  Antimicrobials: Anti-infectives (From admission, onward)    Start     Dose/Rate Route Frequency Ordered Stop   12/18/22 2300  bictegravir-emtricitabine-tenofovir AF (BIKTARVY) 50-200-25 MG per tablet 1 tablet        1 tablet Oral Daily 12/18/22 1910     12/18/22 1100  Ampicillin-Sulbactam (UNASYN) 3 g in sodium chloride 0.9 % 100 mL IVPB        3 g 200 mL/hr over 30 Minutes Intravenous Every 6 hours 12/18/22 1054     12/18/22 0500  sulfamethoxazole-trimethoprim (BACTRIM) 415 mg of trimethoprim in dextrose 5 % 500 mL IVPB  Status:  Discontinued        415 mg of trimethoprim 350.6 mL/hr over 90 Minutes Intravenous Every 6 hours 12/18/22 0427 12/18/22 1051        I have personally reviewed the following labs and  images: CBC: Recent Labs  Lab 12/14/22 1132 12/18/22 0226 12/18/22 0251 12/19/22 0904 12/20/22 1002  WBC 6.8 11.2*  --  6.2 6.4  NEUTROABS 4,474  --   --   --   --   HGB 16.4 16.3 16.7 16.3 18.2*  HCT 48.1 45.3 49.0 46.6 52.1*  MCV 93.8 89.7  --  90.3 91.2  PLT 209 220  --  209 220   BMP &GFR Recent Labs  Lab 12/14/22 1132 12/18/22 0226 12/18/22 0251 12/19/22 0904 12/20/22 1002  NA 141 136 139 138 139  K 3.8 3.1* 3.4* 4.1 3.9  CL 105 103 105 105 102  CO2 27 24  --  23 27  GLUCOSE 84 133* 123* 107* 96  BUN 7 12 12 6 9   CREATININE 0.89 1.06 1.20 1.04 1.16  CALCIUM 9.4 8.5*  --  8.5* 9.0  MG  --   --   --  2.2 2.3  PHOS  --   --   --  2.5 3.1   Estimated Creatinine Clearance: 89.2 mL/min (by C-G formula based on SCr of 1.16 mg/dL). Liver & Pancreas: Recent Labs  Lab 12/14/22 1132 12/18/22 0226 12/18/22 1312 12/19/22 0904 12/20/22 1002  AST 19 24  --  21  --  ALT 13 19  --  19  --   ALKPHOS  --  77  --  74  --   BILITOT 0.5 1.0  --  1.0  --   PROT 6.5 6.9 6.7 6.6  --   ALBUMIN  --  3.7  --  3.5 4.0   No results for input(s): "LIPASE", "AMYLASE" in the last 168 hours. No results for input(s): "AMMONIA" in the last 168 hours. Diabetic: No results for input(s): "HGBA1C" in the last 72 hours. No results for input(s): "GLUCAP" in the last 168 hours. Cardiac Enzymes: No results for input(s): "CKTOTAL", "CKMB", "CKMBINDEX", "TROPONINI" in the last 168 hours. No results for input(s): "PROBNP" in the last 8760 hours. Coagulation Profile: Recent Labs  Lab 12/18/22 0226  INR 1.0   Thyroid Function Tests: No results for input(s): "TSH", "T4TOTAL", "FREET4", "T3FREE", "THYROIDAB" in the last 72 hours. Lipid Profile: Recent Labs    12/18/22 1312  CHOL 171   Anemia Panel: No results for input(s): "VITAMINB12", "FOLATE", "FERRITIN", "TIBC", "IRON", "RETICCTPCT" in the last 72 hours. Urine analysis:    Component Value Date/Time   COLORURINE STRAW (A)  12/18/2022 0552   APPEARANCEUR CLEAR 12/18/2022 0552   LABSPEC 1.025 12/18/2022 0552   PHURINE 7.0 12/18/2022 0552   GLUCOSEU NEGATIVE 12/18/2022 0552   HGBUR NEGATIVE 12/18/2022 0552   BILIRUBINUR NEGATIVE 12/18/2022 0552   KETONESUR NEGATIVE 12/18/2022 0552   PROTEINUR NEGATIVE 12/18/2022 0552   NITRITE NEGATIVE 12/18/2022 0552   LEUKOCYTESUR NEGATIVE 12/18/2022 0552   Sepsis Labs: Invalid input(s): "PROCALCITONIN", "LACTICIDVEN"  Microbiology: Recent Results (from the past 240 hour(s))  Resp panel by RT-PCR (RSV, Flu A&B, Covid) Anterior Nasal Swab     Status: None   Collection Time: 12/18/22  2:35 AM   Specimen: Anterior Nasal Swab  Result Value Ref Range Status   SARS Coronavirus 2 by RT PCR NEGATIVE NEGATIVE Final    Comment: (NOTE) SARS-CoV-2 target nucleic acids are NOT DETECTED.  The SARS-CoV-2 RNA is generally detectable in upper respiratory specimens during the acute phase of infection. The lowest concentration of SARS-CoV-2 viral copies this assay can detect is 138 copies/mL. A negative result does not preclude SARS-Cov-2 infection and should not be used as the sole basis for treatment or other patient management decisions. A negative result may occur with  improper specimen collection/handling, submission of specimen other than nasopharyngeal swab, presence of viral mutation(s) within the areas targeted by this assay, and inadequate number of viral copies(<138 copies/mL). A negative result must be combined with clinical observations, patient history, and epidemiological information. The expected result is Negative.  Fact Sheet for Patients:  BloggerCourse.com  Fact Sheet for Healthcare Providers:  SeriousBroker.it  This test is no t yet approved or cleared by the [place] FDA and  has been authorized for detection and/or diagnosis of SARS-CoV-2 by FDA under an Emergency Use Authorization (EUA). This EUA  will remain  in effect (meaning this test can be used) for the duration of the COVID-19 declaration under Section 564(b)(1) of the Act, 21 U.S.C.section 360bbb-3(b)(1), unless the authorization is terminated  or revoked sooner.       Influenza A by PCR NEGATIVE NEGATIVE Final   Influenza B by PCR NEGATIVE NEGATIVE Final    Comment: (NOTE) The Xpert Xpress SARS-CoV-2/FLU/RSV plus assay is intended as an aid in the diagnosis of influenza from Nasopharyngeal swab specimens and should not be used as a sole basis for treatment. Nasal washings and aspirates are unacceptable for  Xpert Xpress SARS-CoV-2/FLU/RSV testing.  Fact Sheet for Patients: BloggerCourse.com  Fact Sheet for Healthcare Providers: SeriousBroker.it  This test is not yet approved or cleared by the [place] FDA and has been authorized for detection and/or diagnosis of SARS-CoV-2 by FDA under an Emergency Use Authorization (EUA). This EUA will remain in effect (meaning this test can be used) for the duration of the COVID-19 declaration under Section 564(b)(1) of the Act, 21 U.S.C. section 360bbb-3(b)(1), unless the authorization is terminated or revoked.     Resp Syncytial Virus by PCR NEGATIVE NEGATIVE Final    Comment: (NOTE) Fact Sheet for Patients: BloggerCourse.com  Fact Sheet for Healthcare Providers: SeriousBroker.it  This test is not yet approved or cleared by the [place] FDA and has been authorized for detection and/or diagnosis of SARS-CoV-2 by FDA under an Emergency Use Authorization (EUA). This EUA will remain in effect (meaning this test can be used) for the duration of the COVID-19 declaration under Section 564(b)(1) of the Act, 21 U.S.C. section 360bbb-3(b)(1), unless the authorization is terminated or revoked.  Performed at [facility], [address] [place], [place] [address]   Blood Culture (routine x 2)     Status: Abnormal (Preliminary result)   Collection Time: 12/18/22  2:50 AM   Specimen: BLOOD  Result Value Ref Range Status   Specimen Description   Final    BLOOD RIGHT ANTECUBITAL Performed at [facility] [address], [place], [place] [address]    Special Requests   Final    Blood Culture adequate volume BOTTLES DRAWN AEROBIC AND ANAEROBIC Performed at [facility], [address], [place], [place] [address]    Culture  Setup Time   Final    GRAM POSITIVE COCCI ANAEROBIC BOTTLE ONLY CRITICAL RESULT CALLED TO, READ BACK BY AND VERIFIED WITH: PHARMD JUSTIN LEGGE [id number] AT 0829 BY EC    Culture (A)  Final    STAPHYLOCOCCUS EPIDERMIDIS THE SIGNIFICANCE OF ISOLATING THIS ORGANISM FROM A SINGLE SET OF BLOOD CULTURES WHEN MULTIPLE SETS ARE DRAWN IS UNCERTAIN. PLEASE NOTIFY THE MICROBIOLOGY DEPARTMENT WITHIN ONE WEEK IF SPECIATION AND SENSITIVITIES ARE REQUIRED. Performed at [facility], [address], [place], [place] [address]    Report Status PENDING  Incomplete  Blood Culture ID Panel (Reflexed)     Status: Abnormal   Collection Time: 12/18/22  2:50 AM  Result Value Ref Range Status   Enterococcus faecalis NOT DETECTED NOT DETECTED Final   Enterococcus Faecium NOT DETECTED NOT DETECTED Final   Listeria monocytogenes NOT DETECTED NOT DETECTED Final   Staphylococcus species DETECTED (A) NOT DETECTED Final    Comment: CRITICAL RESULT CALLED TO, READ BACK BY AND VERIFIED WITH: PHARMD JUSTIN LEGGE [id number] AT 0829 BY EC    Staphylococcus aureus (BCID) NOT DETECTED NOT DETECTED Final   Staphylococcus epidermidis DETECTED (A) NOT DETECTED Final    Comment: CRITICAL RESULT CALLED TO, READ BACK BY AND VERIFIED WITH: PHARMD JUSTIN LEGGE [id number] 0829 BY EC    Staphylococcus lugdunensis NOT DETECTED NOT DETECTED Final   Streptococcus species NOT DETECTED NOT DETECTED Final    Streptococcus agalactiae NOT DETECTED NOT DETECTED Final   Streptococcus pneumoniae NOT DETECTED NOT DETECTED Final   Streptococcus pyogenes NOT DETECTED NOT DETECTED Final   A.calcoaceticus-baumannii NOT DETECTED NOT DETECTED Final   Bacteroides fragilis NOT DETECTED NOT DETECTED Final   Enterobacterales NOT DETECTED NOT DETECTED Final   Enterobacter cloacae complex NOT DETECTED NOT DETECTED Final  Escherichia coli NOT DETECTED NOT DETECTED Final   Klebsiella aerogenes NOT DETECTED NOT DETECTED Final   Klebsiella oxytoca NOT DETECTED NOT DETECTED Final   Klebsiella pneumoniae NOT DETECTED NOT DETECTED Final   Proteus species NOT DETECTED NOT DETECTED Final   Salmonella species NOT DETECTED NOT DETECTED Final   Serratia marcescens NOT DETECTED NOT DETECTED Final   Haemophilus influenzae NOT DETECTED NOT DETECTED Final   Neisseria meningitidis NOT DETECTED NOT DETECTED Final   Pseudomonas aeruginosa NOT DETECTED NOT DETECTED Final   Stenotrophomonas maltophilia NOT DETECTED NOT DETECTED Final   Candida albicans NOT DETECTED NOT DETECTED Final   Candida auris NOT DETECTED NOT DETECTED Final   Candida glabrata NOT DETECTED NOT DETECTED Final   Candida krusei NOT DETECTED NOT DETECTED Final   Candida parapsilosis NOT DETECTED NOT DETECTED Final   Candida tropicalis NOT DETECTED NOT DETECTED Final   Cryptococcus neoformans/gattii NOT DETECTED NOT DETECTED Final   Methicillin resistance mecA/C NOT DETECTED NOT DETECTED Final    Comment: Performed at [facility], [address], [place], [place] [address]  Blood Culture (routine x 2)     Status: None (Preliminary result)   Collection Time: 12/18/22  2:55 AM   Specimen: BLOOD  Result Value Ref Range Status   Specimen Description   Final    BLOOD LEFT ANTECUBITAL Performed at [facility], [address], [place], [place] [address]    Special Requests   Final    Blood Culture adequate volume BOTTLES DRAWN AEROBIC  AND ANAEROBIC Performed at [facility], [address], [place], [place] [address]    Culture   Final    NO GROWTH 2 DAYS Performed at [facility], [address] [place], [place] [address]    Report Status PENDING  Incomplete  Pleural fluid culture w Gram Stain     Status: None (Preliminary result)   Collection Time: 12/18/22  1:12 PM   Specimen: Pleural Fluid  Result Value Ref Range Status   Specimen Description   Final    PLEURAL Performed at [facility], [address] [place], [place] [address]    Special Requests   Final    Immunocompromised Performed at [facility], [address], [place], [place] [address]    Gram Stain   Final    FEW WBC PRESENT, PREDOMINANTLY MONONUCLEAR NO ORGANISMS SEEN    Culture   Final    NO GROWTH 2 DAYS Performed at [facility], [address] [place], [place] [address]    Report Status PENDING  Incomplete    Radiology Studies: No results found.    Reise Hietala T. Kama Cammarano [facility]  If 7PM-7AM, please contact night-coverage [email or web address] 12/20/2022, 2:28 PM

## 2022-12-20 NOTE — Progress Notes (Signed)
    [facility]   Reason for visit: Follow up on pulmonary mass and + Cryptococcal Ag  Interval History: CT head with no concerns  Physical Exam: Constitutional:  Vitals:   12/20/22 0505 12/20/22 1257  BP: (!) 135/95 (!) 125/91  Pulse: 83 82  Resp: 18   Temp: 98.3 F (36.8 C) 98.7 F (37.1 C)  SpO2: 96% 98%   Pulmonary mass and bronchoscopy scheduled for tomorrow  With a positive Cryptococcal Ag and his history of meningitis, I recommend a lumbar puncture with an opening pressure.  Send for glucose, protein, cell count, gram stain and culture and cryptococcal Ag.

## 2022-12-20 NOTE — TOC Initial Note (Signed)
Transition of Care [facility]) - Initial/Assessment Note    Patient Details  Name: Christopher Henson MRN: [id number] Date of Birth: 12-Oct-1980  Transition of Care [facility]) CM/SW Contact:    Larrie Kass, LCSW Phone Number: 12/20/2022, 10:15 AM  Clinical Narrative:                 CSW received consult for medication assistance. Pt has insurance and does not meet qualification for [facility] program.    Expected Discharge Plan: Home/Self Care Barriers to Discharge: Continued Medical Work up   Patient Goals and CMS Choice            Expected Discharge Plan and Services                                              Prior Living Arrangements/Services                       Activities of Daily Living Home Assistive Devices/Equipment: None ADL Screening (condition at time of admission) Patient's cognitive ability adequate to safely complete daily activities?: Yes Is the patient deaf or have difficulty hearing?: No Does the patient have difficulty seeing, even when wearing glasses/contacts?: No Does the patient have difficulty concentrating, remembering, or making decisions?: No Patient able to express need for assistance with ADLs?: Yes Does the patient have difficulty dressing or bathing?: No Independently performs ADLs?: Yes (appropriate for developmental age) Does the patient have difficulty walking or climbing stairs?: No Weakness of Legs: None Weakness of Arms/Hands: None  Permission Sought/Granted                  Emotional Assessment              Admission diagnosis:  Mediastinal mass [J98.59] AIDS (acquired immune deficiency syndrome) (HCC) [B20] Patient Active Problem List   Diagnosis Date Noted   Mediastinal mass 12/18/2022   Pleural effusion on left 12/18/2022   Hypokalemia 12/18/2022   Alcoholism (HCC) 12/18/2022   Smoker 12/18/2022   Routine screening for STI (sexually transmitted infection) 06/08/2022   Encounter for long-term  (current) use of high-risk medication 06/08/2022   Dental abscess 11/28/2019   Need for hepatitis A and B vaccination 02/25/2019   Thrombocytopenia (HCC) 02/25/2019   Elevated liver enzymes 02/25/2019   Influenza vaccination declined 02/25/2019   Alcohol use 02/21/2019   Depression 12/10/2018   Poor dentition 10/03/2018   HIV (human immunodeficiency virus infection) (HCC) 09/05/2018   Protein-calorie malnutrition, severe 06/10/2018   History of cryptococcal meningitis    PCP:  [name], PA-C Pharmacy:   [id number] - [place], [place] - [address] AT [facility] [address] [place] [address] Phone: [phone] Fax: [phone]     Social Determinants of Health (SDOH) Social History: SDOH Screenings   Food Insecurity: No Food Insecurity (12/18/2022)  Housing: Low Risk  (12/18/2022)  Transportation Needs: No Transportation Needs (12/18/2022)  Utilities: Not At Risk (12/18/2022)  Depression (PHQ2-9): Low Risk  (06/08/2022)  Tobacco Use: High Risk (12/18/2022)   SDOH Interventions:     Readmission Risk Interventions     No data to display

## 2022-12-20 NOTE — Progress Notes (Signed)
NAME:  Christopher Henson, MRN:  [id number], DOB:  11-12-1980, LOS: 2 ADMISSION DATE:  12/18/2022, CONSULTATION DATE: 12/18/2022 REFERRING MD: Dr. Sanda Klein, CHIEF COMPLAINT: Pleural effusion  History of Present Illness:  42 year old male with a past medical history of HIV/AIDS (last CD4 count 258, HIV quant 89 on 12/14/2022), alcohol use disorder, protein calorie malnutrition, history of cryptococcal meningitis who presented to the emergency department on 12/18/2022 with chest pain and shortness of breath.  On arrival he states that the symptoms had began the night before.  It was noted that he was seen in the emergency department recently on 12/12/2022 and had a chest x-ray that was concerning for a mediastinal mass.  He had a CT but he left AMA.  Today in the emergency department he had repeated CT imaging showed no pulmonary embolus.  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  There was associated mass effect on the left upper lobe pulmonary artery.  Also noted to have a left upper lobe pulmonary nodule that is 1.6 x 1.2 cm.  Noted to have a left-sided pleural effusion.  He did have a CT abdomen pelvis as well which showed no metastatic disease in the abdomen or pelvis.  Labs with WBC 11.2, K of 3.1, negative respiratory panel, lactic 2.7, negative troponin.  He was admitted to hospitalist.  Hospitalist consulted PCCM for thoracentesis of the left pleural effusion.  On my interview, the patient states that he has not had his Prezcobix medication in 2 months and has not been able to take his Biktarvy over the past 2 weeks related to cost and insurance.  He notes that he has felt mildly short of breath and has had progressively worsening symptoms over the past 2 weeks.  Also notes that he has had about 10 to 15 pounds weight loss over the past 1 to 2 months, night sweats.  Denies having overt fevers, diarrhea.  Has not noticed any lymphadenopathy.  Denies any new cough or sputum  production, sick contacts, recent travel.   Pertinent  Medical History  HIV/AIDS (last CD4 count 258, HIV quant 89 on 12/14/2022), alcohol use disorder, protein calorie malnutrition, history of cryptococcal meningitis   Significant Hospital Events: Including procedures, antibiotic start and stop dates in addition to other pertinent events   12/18/22 underwent thoracentesis 12/18/22 Chest CT with large mediastinal mass 8 x 7.5, hilar adenopathy, narrowing of the left upper lobe, infiltrate  Interim History / Subjective:  No overnight events Denies any pain or discomfort   Objective   Blood pressure (!) 135/95, pulse 83, temperature 98.3 F (36.8 C), resp. rate 18, height 6' (1.829 m), weight 78.9 kg, SpO2 96%.        Intake/Output Summary (Last 24 hours) at 12/20/2022 0929 Last data filed at 12/20/2022 0700 Gross per 24 hour  Intake 860 ml  Output --  Net 860 ml   Filed Weights   12/18/22 2115  Weight: 78.9 kg    Examination: General: Middle-age, does not appear to be in distress HENT: Moist oral mucosa Lungs: Decreased air movement at the base Cardiovascular: S1-S2 appreciated Abdomen: Soft, bowel sounds appreciated Extremities: No clubbing, no edema Neuro: Alert and oriented x 3 GU:   Resolved Hospital Problem list     Assessment & Plan:  S/p thoracentesis for left pleural effusion -Exudative effusion -Cytology pending  Mediastinal mass with adenopathy -Requested IR evaluation for possible biopsy -Awaiting cytology before decision is made -Axillary nodes are  small and will not be amenable to intervention  Concern for endobronchial narrowing and extrinsic mass lesion Adenopathy -Schedule for bronchoscopy with EBUS -Scheduled for 130 12/21/2022  HIV infection -ID continues to follow  Alcohol use disorder  Virl Diamond, MD [place] PCCM Pager: See Loretha Stapler

## 2022-12-20 NOTE — Progress Notes (Signed)
The patient left the unit today without staff.He was found in the cafeteria. The pt was encouraged to remain on the unit while on telemetry. Patient reminded he is to remain NPO due to potential CT biopsy at 1130.

## 2022-12-21 ENCOUNTER — Inpatient Hospital Stay (HOSPITAL_COMMUNITY): Payer: 59 | Admitting: Anesthesiology

## 2022-12-21 ENCOUNTER — Inpatient Hospital Stay (HOSPITAL_COMMUNITY): Payer: 59

## 2022-12-21 ENCOUNTER — Encounter (HOSPITAL_COMMUNITY): Payer: Self-pay | Admitting: Internal Medicine

## 2022-12-21 ENCOUNTER — Encounter (HOSPITAL_COMMUNITY)
Admission: EM | Disposition: A | Discharge status: Home / Self Care | Payer: Self-pay | Source: Home / Self Care | Attending: Student

## 2022-12-21 DIAGNOSIS — I889 Nonspecific lymphadenitis, unspecified: Secondary | ICD-10-CM | POA: Diagnosis not present

## 2022-12-21 DIAGNOSIS — F32A Depression, unspecified: Secondary | ICD-10-CM

## 2022-12-21 DIAGNOSIS — B20 Human immunodeficiency virus [HIV] disease: Secondary | ICD-10-CM | POA: Diagnosis not present

## 2022-12-21 DIAGNOSIS — J9859 Other diseases of mediastinum, not elsewhere classified: Secondary | ICD-10-CM | POA: Diagnosis not present

## 2022-12-21 HISTORY — PX: ENDOBRONCHIAL ULTRASOUND: SHX5096

## 2022-12-21 HISTORY — PX: BRONCHIAL WASHINGS: SHX5105

## 2022-12-21 HISTORY — PX: FINE NEEDLE ASPIRATION: SHX5430

## 2022-12-21 HISTORY — PX: VIDEO BRONCHOSCOPY: SHX5072

## 2022-12-21 HISTORY — PX: BIOPSY: SHX5522

## 2022-12-21 LAB — RENAL FUNCTION PANEL
Albumin: 4.2 g/dL (ref 3.5–5.0)
Anion gap: 11 (ref 5–15)
BUN: 11 mg/dL (ref 6–20)
CO2: 25 mmol/L (ref 22–32)
Calcium: 9.1 mg/dL (ref 8.9–10.3)
Chloride: 102 mmol/L (ref 98–111)
Creatinine, Ser: 1.37 mg/dL — ABNORMAL HIGH (ref 0.61–1.24)
GFR, Estimated: >60 mL/min (ref >60)
Glucose, Bld: 193 mg/dL — ABNORMAL HIGH (ref 70–99)
Phosphorus: 2.2 mg/dL — ABNORMAL LOW (ref 2.5–4.6)
Potassium: 3.8 mmol/L (ref 3.5–5.1)
Sodium: 138 mmol/L (ref 135–145)

## 2022-12-21 LAB — CULTURE, BLOOD (ROUTINE X 2): Special Requests: ADEQUATE

## 2022-12-21 LAB — CBC
HCT: 49 % (ref 39.0–52.0)
Hemoglobin: 17.2 g/dL — ABNORMAL HIGH (ref 13.0–17.0)
MCH: 31.9 pg (ref 26.0–34.0)
MCHC: 35.1 g/dL (ref 30.0–36.0)
MCV: 90.7 fL (ref 80.0–100.0)
Platelets: 226 10*3/uL (ref 150–400)
RBC: 5.4 MIL/uL (ref 4.22–5.81)
RDW: 13.7 % (ref 11.5–15.5)
WBC: 10.2 10*3/uL (ref 4.0–10.5)
nRBC: 0 % (ref 0.0–0.2)

## 2022-12-21 LAB — BODY FLUID CULTURE W GRAM STAIN: Culture: NO GROWTH

## 2022-12-21 LAB — MAGNESIUM: Magnesium: 2.4 mg/dL (ref 1.7–2.4)

## 2022-12-21 LAB — HISTOPLASMA ANTIGEN, URINE: Histoplasma Antigen, urine: NEGATIVE (ref <0.2)

## 2022-12-21 SURGERY — BRONCHOSCOPY, WITH FLUOROSCOPY
Anesthesia: General | Laterality: Left

## 2022-12-21 MED ORDER — PROPOFOL 10 MG/ML IV BOLUS
INTRAVENOUS | Status: AC
  Filled 2022-12-21: qty 20

## 2022-12-21 MED ORDER — SUGAMMADEX SODIUM 200 MG/2ML IV SOLN
INTRAVENOUS | Status: DC | PRN
  Administered 2022-12-21: 200 mg via INTRAVENOUS

## 2022-12-21 MED ORDER — ALBUMIN HUMAN 5 % IV SOLN
INTRAVENOUS | Status: AC
  Filled 2022-12-21: qty 250

## 2022-12-21 MED ORDER — FENTANYL CITRATE (PF) 100 MCG/2ML IJ SOLN
INTRAMUSCULAR | Status: AC
  Filled 2022-12-21: qty 2

## 2022-12-21 MED ORDER — DEXAMETHASONE SODIUM PHOSPHATE 10 MG/ML IJ SOLN
INTRAMUSCULAR | Status: DC | PRN
  Administered 2022-12-21: 10 mg via INTRAVENOUS

## 2022-12-21 MED ORDER — CHLORHEXIDINE GLUCONATE 0.12 % MT SOLN
15.0000 mL | Freq: Once | OROMUCOSAL | Status: AC
  Administered 2022-12-21: 15 mL via OROMUCOSAL

## 2022-12-21 MED ORDER — PHENYLEPHRINE HCL (PRESSORS) 10 MG/ML IV SOLN
INTRAVENOUS | Status: DC | PRN
Start: 2022-12-21 — End: 2022-12-21
  Administered 2022-12-21 (×2): 160 ug via INTRAVENOUS

## 2022-12-21 MED ORDER — MIDAZOLAM HCL 2 MG/2ML IJ SOLN
INTRAMUSCULAR | Status: AC
  Filled 2022-12-21: qty 2

## 2022-12-21 MED ORDER — ALBUMIN HUMAN 5 % IV SOLN
INTRAVENOUS | Status: DC | PRN
Start: 2022-12-21 — End: 2022-12-21

## 2022-12-21 MED ORDER — MIDAZOLAM HCL 5 MG/5ML IJ SOLN
INTRAMUSCULAR | Status: DC | PRN
Start: 2022-12-21 — End: 2022-12-21
  Administered 2022-12-21: 2 mg via INTRAVENOUS

## 2022-12-21 MED ORDER — LACTATED RINGERS IV SOLN
INTRAVENOUS | Status: DC
  Administered 2022-12-21: 1000 mL via INTRAVENOUS

## 2022-12-21 MED ORDER — LACTATED RINGERS IV SOLN: INTRAVENOUS | Status: DC | PRN

## 2022-12-21 MED ORDER — PROPOFOL 10 MG/ML IV BOLUS
INTRAVENOUS | Status: DC | PRN
  Administered 2022-12-21: 140 mg via INTRAVENOUS

## 2022-12-21 MED ORDER — FENTANYL CITRATE (PF) 100 MCG/2ML IJ SOLN
INTRAMUSCULAR | Status: DC | PRN
  Administered 2022-12-21 (×2): 50 ug via INTRAVENOUS

## 2022-12-21 MED ORDER — ONDANSETRON HCL 4 MG/2ML IJ SOLN
INTRAMUSCULAR | Status: DC | PRN
  Administered 2022-12-21: 4 mg via INTRAVENOUS

## 2022-12-21 MED ORDER — LIDOCAINE HCL (CARDIAC) PF 100 MG/5ML IV SOSY
PREFILLED_SYRINGE | INTRAVENOUS | Status: DC | PRN
  Administered 2022-12-21: 50 mg via INTRAVENOUS

## 2022-12-21 MED ORDER — CHLORHEXIDINE GLUCONATE 0.12 % MT SOLN
OROMUCOSAL | Status: AC
  Filled 2022-12-21: qty 15

## 2022-12-21 MED ORDER — ROCURONIUM BROMIDE 100 MG/10ML IV SOLN
INTRAVENOUS | Status: DC | PRN
  Administered 2022-12-21: 50 mg via INTRAVENOUS

## 2022-12-21 NOTE — Plan of Care (Signed)

## 2022-12-21 NOTE — Op Note (Signed)
[facility] Cardiopulmonary Patient Name: Christopher Henson Procedure Date: 12/21/2022 MRN: [id number] Attending MD: Tomma Lightning MD, MD, [id number] Date of Birth: March 15, 1981 CSN: [id number] Age: 42 Admit Type: Inpatient Ethnicity: Not Hispanic or Latino Procedure:             Bronchoscopy Indications:           Mediastinal adenopathy, Bilateral hilar lymphadenopathy Providers:             Bekki Tavenner A. Wynona Neat MD, MD, Lorenza Evangelist, RN, Beryle Beams, Technician Referring MD:           Medicines:             General Anesthesia, Monitored Anesthesia Care Complications:         No immediate complications Estimated Blood Loss:  Estimated blood loss was minimal. Procedure:      Pre-Anesthesia Assessment:      - The anesthesia plan was to use general anesthesia.      - The heart rate, respiratory rate, oxygen saturations, blood pressure,       adequacy of pulmonary ventilation, and response to care were monitored       throughout the procedure.      After obtaining informed consent, the bronchoscope was passed under       direct vision. Throughout the procedure, the patient's blood pressure,       pulse, and oxygen saturations were monitored continuously. the [id number]       ([id number]) Olympus bronoscope was introduced through the mouth, via the       endotracheal tube (the patient was intubated for the procedure) and       advanced to the tracheobronchial tree of both lungs. the BF- [id number] (       [id number] ) EBUS Olympus Scope was introduced through the mouth, via the       endotracheal tube (the patient was intubated for the procedure) and       advanced to the tracheobronchial tree of both lungs. The procedure was       accomplished without difficulty. The patient tolerated the procedure       well. The procedure was accomplished without difficulty. The patient       tolerated the procedure well. Findings:      The trachea is of normal caliber.  The carina is sharp. The       tracheobronchial tree was examined to at least the first subsegmental       level. Bronchial mucosa and anatomy are normal; there are no       endobronchial lesions, and mild secretions.      Left Lung Abnormalities: The bronchoscope was advanced until wedged at       the desired location for bronchoalveolar lavage. BAL was performed in       the left upper lobe of the lung and sent for bacterial, AFB and fungal       analysis. 100 mL of fluid were instilled. 40 mL were returned. The       return was cellular. There were no mucoid plugs in the return fluid. The       bronchoscope was advanced until wedged at the desired location for       bronchoalveolar lavage. BAL was performed in the left lower lobe of the  lung and sent for bacterial, AFB and fungal analysis. 90 mL of fluid       were instilled. 40 mL were returned. The return was cellular. There were       no mucoid plugs in the return fluid. Transbronchial biopsies were       performed in the lateral basal segment of the left lower lobe using       forceps and sent for histopathology examination. The procedure was       guided by fluoroscopy. Biopsy of lung tissue was obtained. Seven biopsy       passes were performed. Seven biopsy samples were obtained.      The scope was withdrawn and replaced with the EBUS bronchoscope to       accomplish the ultrasound examination.      Lymph Nodes: An endobronchial ultrasound endoscope was utilized to       systematically examine the right superior interlobar region (level 11Rs)       and left interlobar region (level 11L) in order to assist with fine       needle aspiration. Lymph node sizing was performed via endobronchial       ultrasound for suspected lung cancer. Sampling by transbronchial needle       aspiration was also performed using a Wang 21 gauge needle in the right       superior interlobar region (level 11Rs) and left interlobar region        (level 11L) and sent for routine cytology.      - The 11L (interlobar) node was 10 mm by CT. Three samples with the       needle were obtained.      - The 11Rs (superior interlobar) node was 10 mm by EBUS. Three samples       with the needle were obtained. Impression:      - Mediastinal adenopathy      - Bilateral hilar lymphadenopathy      - The airway examination was normal.      - Bronchoalveolar lavage was performed.      - Bronchoalveolar lavage was performed.      - Transbronchial lung biopsies were performed.      - Endobronchial ultrasound was performed.      - Lymph node sizing and sampling was performed. Tissue was obtained from       this exam. The preliminary diagnosis is benign inflammatory changes. Moderate Sedation:      An independent trained observer was present and continuously monitored       the patient. Recommendation:      - Await test results. Procedure Code(s):      --- Professional ---      [phone], Bronchoscopy, rigid or flexible, including fluoroscopic guidance,       when performed; with endobronchial ultrasound (EBUS) guided       transtracheal and/or transbronchial sampling (eg,       aspiration[s]/biopsy[ies]), one or two mediastinal and/or hilar lymph       node stations or structures      31628, Bronchoscopy, rigid or flexible, including fluoroscopic guidance,       when performed; with transbronchial lung biopsy(s), single lobe      31624, Bronchoscopy, rigid or flexible, including fluoroscopic guidance,       when performed; with bronchial alveolar lavage      31654, Bronchoscopy, rigid or flexible, including fluoroscopic guidance,       when performed; with  transendoscopic endobronchial ultrasound (EBUS)       during bronchoscopic diagnostic or therapeutic intervention(s) for       peripheral lesion(s) (List separately in addition to code for primary       procedure[s]) Diagnosis Code(s):      --- Professional ---      I88.9, Nonspecific  lymphadenitis, unspecified      R59.0, Localized enlarged lymph nodes CPT copyright 2022 American Medical Association. All rights reserved. The codes documented in this report are preliminary and upon coder review may  be revised to meet current compliance requirements. Virl Diamond, MD Tomma Lightning MD, MD 12/21/2022 2:49:29 PM This report has been signed electronically. Number of Addenda: 0 Scope In: Scope Out:

## 2022-12-21 NOTE — Anesthesia Procedure Notes (Signed)
Procedure Name: Intubation Date/Time: 12/21/2022 1:47 PM  Performed by: [place], Clydene Pugh, CRNAPre-anesthesia Checklist: Patient identified, Emergency Drugs available, Suction available and Patient being monitored Patient Re-evaluated:Patient Re-evaluated prior to induction Oxygen Delivery Method: Circle system utilized Preoxygenation: Pre-oxygenation with 100% oxygen Induction Type: IV induction Ventilation: Mask ventilation without difficulty Laryngoscope Size: Mac and 4 Grade View: Grade I Tube type: Oral Tube size: 8.0 mm Number of attempts: 1 Airway Equipment and Method: Stylet and Oral airway Placement Confirmation: ETT inserted through vocal cords under direct vision, positive ETCO2 and breath sounds checked- equal and bilateral Secured at: 23 cm Tube secured with: Tape Dental Injury: Teeth and Oropharynx as per pre-operative assessment

## 2022-12-21 NOTE — Plan of Care (Signed)

## 2022-12-21 NOTE — Progress Notes (Signed)
[facility] PROGRESS NOTE    Progress Note  Christopher Henson  WGN:[id number] DOB: [birth year] DOA: 12/18/2022 PCP: [name], PA-C     Brief Narrative:   Christopher Henson is an 42 y.o. 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, cryptococcal meningitis alcohol abuse comes in with chest pain shortness of breath and generalized weakness with unintentional weight loss, he relates has not been able to take his antiviral medication for about a week due to cost in the ED chest x-ray showed mediastinal mass he was recommended to have a CTA but left AGAINST MEDICAL ADVICE came back to the ED, CT angio showed no PE but showed an 8 x 7 anterior mediastinal mass in the left hilar region with mass affect on the left upper lobe pulmonary artery, left upper lobe pulmonary nodule 1.6 x 1.2 cm and small left increased size pleural effusion CT scan of the abdomen pelvis showed no evidence of metastatic disease in the abdomen and pelvis, CT of the head showed no acute findings.  Urinary was consulted and bronchoscopy scheduled for 12/21/2022. ID is on board, currently on IV Unasyn.  Assessment/Plan:   Anterior large Mediastinal mass/lymphadenopathy/groundglass opacity in the left upper lung/acute respiratory failure: Respiratory failure is resolved. Pulmonary was consulted and scheduled for bronchoscopy on 12/21/2022. Currently on IV Unasyn. Now been weaned to room air. Has remained afebrile leukocytosis resolved.  HIV: With a CD4 count of 258. Noncompliance with his medication. ID was consulted recommended IV Unasyn and started on Biktarvy.  Positive cryptococcal antigen: ID recommended lumbar puncture with opening pressure. Analysis sent for glucose protein cell count Gram stain cryptococcal antigen and culture.  Tobacco abuse: Nicotine patch was described.  Alcohol use: No signs of withdrawal continue thiamine and folate.  Noncompliance: Has been counseled.  Hypokalemia: Repleted  now resolved.  Blood cultures positive : For Staph epidermidis 1 out of 4 likely contaminant.   DVT prophylaxis: none Family Communication:none Status is: Inpatient Remains inpatient appropriate because: Mediastinal mass    Code Status:     Code Status Orders  (From admission, onward)           Start     Ordered   12/18/22 0828  Full code  Continuous       Question:  By:  Answer:  Consent: discussion documented in EHR   12/18/22 0828           Code Status History     Date Active Date Inactive Code Status Order ID Comments User Context   06/08/2018 0855 06/21/2018 1833 Full Code [id number]  Rolly Salter, MD Inpatient         IV Access:   Peripheral IV   Procedures and diagnostic studies:   CT HEAD WO CONTRAST ( )  Result Date: 12/20/2022 CLINICAL DATA:  Meningitis/CNS infection suspected Cryptococcal positive and needs LP EXAM: CT HEAD WITHOUT CONTRAST TECHNIQUE: Contiguous axial images were obtained from the base of the skull through the vertex without intravenous contrast. RADIATION DOSE REDUCTION: This exam was performed according to the departmental dose-optimization program which includes automated exposure control, adjustment of the mA and/or kV according to patient size and/or use of iterative reconstruction technique. COMPARISON:  None Available. FINDINGS: Brain: No evidence of acute infarction, hemorrhage, hydrocephalus, extra-axial collection or mass lesion/mass effect. Vascular: No hyperdense vessel or unexpected calcification. Skull: Normal. Negative for fracture or focal lesion. Sinuses/Orbits: No middle ear or mastoid effusion. Paranasal sinuses are clear. Orbits are unremarkable. Other: None. IMPRESSION:  No acute intracranial abnormality. Electronically Signed   By: Lorenza Cambridge M.D.   On: 12/20/2022 14:31     Medical Consultants:   None.   Subjective:    [name] relates her breathing is about the same  Objective:    Vitals:    12/20/22 1257 12/20/22 2115 12/21/22 0512 12/21/22 0800  BP: (!) 125/91 (!) 155/102 (!) 123/96   Pulse: 82 79 79   Resp:   13 17  Temp: 98.7 F (37.1 C) 98.4 F (36.9 C) 98.3 F (36.8 C)   TempSrc: Oral Oral Oral   SpO2: 98% 96% 98%   Weight:      Height:       SpO2: 98 % O2 Flow Rate (L/min): 4 L/min   Intake/Output Summary (Last 24 hours) at 12/21/2022 1016 Last data filed at 12/21/2022 0512 Gross per 24 hour  Intake 1139.86 ml  Output --  Net 1139.86 ml   Filed Weights   12/18/22 2115  Weight: 78.9 kg    Exam: General exam: In no acute distress. Respiratory system: Good air movement and clear to auscultation. Cardiovascular system: S1 & S2 heard, RRR. No JVD. Gastrointestinal system: Abdomen is nondistended, soft and nontender.  Extremities: No pedal edema. Skin: No rashes, lesions or ulcers Psychiatry: Judgement and insight appear normal. Mood & affect appropriate.    Data Reviewed:    Labs: Basic Metabolic Panel: Recent Labs  Lab 12/14/22 1132 12/18/22 0226 12/18/22 0251 12/19/22 0904 12/20/22 1002  NA 141 136 139 138 139  K 3.8 3.1* 3.4* 4.1 3.9  CL 105 103 105 105 102  CO2 27 24  --  23 27  GLUCOSE 84 133* 123* 107* 96  BUN 7 12 12 6 9   CREATININE 0.89 1.06 1.20 1.04 1.16  CALCIUM 9.4 8.5*  --  8.5* 9.0  MG  --   --   --  2.2 2.3  PHOS  --   --   --  2.5 3.1   GFR Estimated Creatinine Clearance: 89.2 mL/min (by C-G formula based on SCr of 1.16 mg/dL). Liver Function Tests: Recent Labs  Lab 12/14/22 1132 12/18/22 0226 12/18/22 1312 12/19/22 0904 12/20/22 1002  AST 19 24  --  21  --   ALT 13 19  --  19  --   ALKPHOS  --  77  --  74  --   BILITOT 0.5 1.0  --  1.0  --   PROT 6.5 6.9 6.7 6.6  --   ALBUMIN  --  3.7  --  3.5 4.0   No results for input(s): "LIPASE", "AMYLASE" in the last 168 hours. No results for input(s): "AMMONIA" in the last 168 hours. Coagulation profile Recent Labs  Lab 12/18/22 0226  INR 1.0   COVID-19  Labs  Recent Labs    12/18/22 1312  LDH 598*    Lab Results  Component Value Date   SARSCOV2NAA NEGATIVE 12/18/2022   SARSCOV2NAA Not Detected 12/12/2019    CBC: Recent Labs  Lab 12/14/22 1132 12/18/22 0226 12/18/22 0251 12/19/22 0904 12/20/22 1002  WBC 6.8 11.2*  --  6.2 6.4  NEUTROABS 4,474  --   --   --   --   HGB 16.4 16.3 16.7 16.3 18.2*  HCT 48.1 45.3 49.0 46.6 52.1*  MCV 93.8 89.7  --  90.3 91.2  PLT 209 220  --  209 220   Cardiac Enzymes: No results for input(s): "CKTOTAL", "CKMB", "CKMBINDEX", "TROPONINI" in the  last 168 hours. BNP (last 3 results) No results for input(s): "PROBNP" in the last 8760 hours. CBG: No results for input(s): "GLUCAP" in the last 168 hours. D-Dimer: No results for input(s): "DDIMER" in the last 72 hours. Hgb A1c: No results for input(s): "HGBA1C" in the last 72 hours. Lipid Profile: Recent Labs    12/18/22 1312  CHOL 171   Thyroid function studies: No results for input(s): "TSH", "T4TOTAL", "T3FREE", "THYROIDAB" in the last 72 hours.  Invalid input(s): "FREET3" Anemia work up: No results for input(s): "VITAMINB12", "FOLATE", "FERRITIN", "TIBC", "IRON", "RETICCTPCT" in the last 72 hours. Sepsis Labs: Recent Labs  Lab 12/14/22 1132 12/18/22 0226 12/18/22 0256 12/18/22 0551 12/19/22 0904 12/20/22 1002  WBC 6.8 11.2*  --   --  6.2 6.4  LATICACIDVEN  --   --  2.7* 1.7  --   --    Microbiology Recent Results (from the past 240 hour(s))  Resp panel by RT-PCR (RSV, Flu A&B, Covid) Anterior Nasal Swab     Status: None   Collection Time: 12/18/22  2:35 AM   Specimen: Anterior Nasal Swab  Result Value Ref Range Status   SARS Coronavirus 2 by RT PCR NEGATIVE NEGATIVE Final    Comment: (NOTE) SARS-CoV-2 target nucleic acids are NOT DETECTED.  The SARS-CoV-2 RNA is generally detectable in upper respiratory specimens during the acute phase of infection. The lowest concentration of SARS-CoV-2 viral copies this assay can  detect is 138 copies/mL. A negative result does not preclude SARS-Cov-2 infection and should not be used as the sole basis for treatment or other patient management decisions. A negative result may occur with  improper specimen collection/handling, submission of specimen other than nasopharyngeal swab, presence of viral mutation(s) within the areas targeted by this assay, and inadequate number of viral copies(<138 copies/mL). A negative result must be combined with clinical observations, patient history, and epidemiological information. The expected result is Negative.  Fact Sheet for Patients:  BloggerCourse.com  Fact Sheet for Healthcare Providers:  SeriousBroker.it  This test is no t yet approved or cleared by the [place] FDA and  has been authorized for detection and/or diagnosis of SARS-CoV-2 by FDA under an Emergency Use Authorization (EUA). This EUA will remain  in effect (meaning this test can be used) for the duration of the COVID-19 declaration under Section 564(b)(1) of the Act, 21 U.S.C.section 360bbb-3(b)(1), unless the authorization is terminated  or revoked sooner.       Influenza A by PCR NEGATIVE NEGATIVE Final   Influenza B by PCR NEGATIVE NEGATIVE Final    Comment: (NOTE) The Xpert Xpress SARS-CoV-2/FLU/RSV plus assay is intended as an aid in the diagnosis of influenza from Nasopharyngeal swab specimens and should not be used as a sole basis for treatment. Nasal washings and aspirates are unacceptable for Xpert Xpress SARS-CoV-2/FLU/RSV testing.  Fact Sheet for Patients: BloggerCourse.com  Fact Sheet for Healthcare Providers: SeriousBroker.it  This test is not yet approved or cleared by the [place] FDA and has been authorized for detection and/or diagnosis of SARS-CoV-2 by FDA under an Emergency Use Authorization (EUA). This EUA will remain in  effect (meaning this test can be used) for the duration of the COVID-19 declaration under Section 564(b)(1) of the Act, 21 U.S.C. section 360bbb-3(b)(1), unless the authorization is terminated or revoked.     Resp Syncytial Virus by PCR NEGATIVE NEGATIVE Final    Comment: (NOTE) Fact Sheet for Patients: BloggerCourse.com  Fact Sheet for Healthcare Providers: SeriousBroker.it  This test  is not yet approved or cleared by the [place] and has been authorized for detection and/or diagnosis of SARS-CoV-2 by FDA under an Emergency Use Authorization (EUA). This EUA will remain in effect (meaning this test can be used) for the duration of the COVID-19 declaration under Section 564(b)(1) of the Act, 21 U.S.C. section 360bbb-3(b)(1), unless the authorization is terminated or revoked.  Performed at [facility], [address] [place], [place] [address]   Blood Culture (routine x 2)     Status: Abnormal   Collection Time: 12/18/22  2:50 AM   Specimen: BLOOD  Result Value Ref Range Status   Specimen Description   Final    BLOOD RIGHT ANTECUBITAL Performed at [facility], [address], [place], [place] [address]    Special Requests   Final    Blood Culture adequate volume BOTTLES DRAWN AEROBIC AND ANAEROBIC Performed at [facility], [address] [place], [place] [address]    Culture  Setup Time   Final    GRAM POSITIVE COCCI ANAEROBIC BOTTLE ONLY CRITICAL RESULT CALLED TO, READ BACK BY AND VERIFIED WITH: PHARMD JUSTIN LEGGE [id number] AT 0829 BY EC    Culture (A)  Final    STAPHYLOCOCCUS EPIDERMIDIS THE SIGNIFICANCE OF ISOLATING THIS ORGANISM FROM A SINGLE SET OF BLOOD CULTURES WHEN MULTIPLE SETS ARE DRAWN IS UNCERTAIN. PLEASE NOTIFY THE MICROBIOLOGY DEPARTMENT WITHIN ONE WEEK IF SPECIATION AND SENSITIVITIES ARE REQUIRED. Performed at [facility], [address] [place], [place] [address]    Report Status 12/21/2022 FINAL  Final  Blood Culture ID Panel (Reflexed)     Status: Abnormal   Collection Time: 12/18/22  2:50 AM  Result Value Ref Range Status   Enterococcus faecalis NOT DETECTED NOT DETECTED Final   Enterococcus Faecium NOT DETECTED NOT DETECTED Final   Listeria monocytogenes NOT DETECTED NOT DETECTED Final   Staphylococcus species DETECTED (A) NOT DETECTED Final    Comment: CRITICAL RESULT CALLED TO, READ BACK BY AND VERIFIED WITH: PHARMD JUSTIN LEGGE [id number] AT 0829 BY EC    Staphylococcus aureus (BCID) NOT DETECTED NOT DETECTED Final   Staphylococcus epidermidis DETECTED (A) NOT DETECTED Final    Comment: CRITICAL RESULT CALLED TO, READ BACK BY AND VERIFIED WITH: PHARMD JUSTIN LEGGE [id number] 0829 BY EC    Staphylococcus lugdunensis NOT DETECTED NOT DETECTED Final   Streptococcus species NOT DETECTED NOT DETECTED Final   Streptococcus agalactiae NOT DETECTED NOT DETECTED Final   Streptococcus pneumoniae NOT DETECTED NOT DETECTED Final   Streptococcus pyogenes NOT DETECTED NOT DETECTED Final   A.calcoaceticus-baumannii NOT DETECTED NOT DETECTED Final   Bacteroides fragilis NOT DETECTED NOT DETECTED Final   Enterobacterales NOT DETECTED NOT DETECTED Final   Enterobacter cloacae complex NOT DETECTED NOT DETECTED Final   Escherichia coli NOT DETECTED NOT DETECTED Final   Klebsiella aerogenes NOT DETECTED NOT DETECTED Final   Klebsiella oxytoca NOT DETECTED NOT DETECTED Final   Klebsiella pneumoniae NOT DETECTED NOT DETECTED Final   Proteus species NOT DETECTED NOT DETECTED Final   Salmonella species NOT DETECTED NOT DETECTED Final   Serratia marcescens NOT DETECTED NOT DETECTED Final   Haemophilus influenzae NOT DETECTED NOT DETECTED Final   Neisseria meningitidis NOT DETECTED NOT DETECTED Final   Pseudomonas aeruginosa NOT DETECTED NOT DETECTED Final   Stenotrophomonas maltophilia NOT DETECTED NOT DETECTED Final   Candida  albicans NOT DETECTED NOT DETECTED Final   Candida auris NOT DETECTED NOT DETECTED Final  Candida glabrata NOT DETECTED NOT DETECTED Final   Candida krusei NOT DETECTED NOT DETECTED Final   Candida parapsilosis NOT DETECTED NOT DETECTED Final   Candida tropicalis NOT DETECTED NOT DETECTED Final   Cryptococcus neoformans/gattii NOT DETECTED NOT DETECTED Final   Methicillin resistance mecA/C NOT DETECTED NOT DETECTED Final    Comment: Performed at [facility], [address] [place], [place] [address]  Blood Culture (routine x 2)     Status: None (Preliminary result)   Collection Time: 12/18/22  2:55 AM   Specimen: BLOOD  Result Value Ref Range Status   Specimen Description   Final    BLOOD LEFT ANTECUBITAL Performed at [facility], [address] [place], [place] [address]    Special Requests   Final    Blood Culture adequate volume BOTTLES DRAWN AEROBIC AND ANAEROBIC Performed at [facility], [address], [place], [place] [address]    Culture   Final    NO GROWTH 3 DAYS Performed at [facility], [address] [place], [place] [address]    Report Status PENDING  Incomplete  Pleural fluid culture w Gram Stain     Status: None (Preliminary result)   Collection Time: 12/18/22  1:12 PM   Specimen: Pleural Fluid  Result Value Ref Range Status   Specimen Description   Final    PLEURAL Performed at [facility], [address], [place], [place] [address]    Special Requests   Final    Immunocompromised Performed at [facility], [address], [place], [place] [address]    Gram Stain   Final    FEW WBC PRESENT, PREDOMINANTLY MONONUCLEAR NO ORGANISMS SEEN    Culture   Final    NO GROWTH 2 DAYS Performed at [facility], [address] [place], [place] [address]    Report Status PENDING  Incomplete  Acid Fast Smear (AFB)     Status: None   Collection Time: 12/18/22  1:12  PM   Specimen: Pleural Fluid  Result Value Ref Range Status   AFB Specimen Processing Concentration  Final   Acid Fast Smear Negative  Final    Comment: (NOTE) Performed At: [facility] [address] [place], [place] [id number] Jolene Schimke MD LK:[id number]    Source (AFB) PLEURAL  Final    Comment: Performed at [facility], [address], [place], [place] [address]     Medications:    bictegravir-emtricitabine-tenofovir AF  1 tablet Oral Daily   enoxaparin (LOVENOX) injection  40 mg Subcutaneous Q24H   Continuous Infusions:  ampicillin-sulbactam (UNASYN) IV 3 g (12/21/22 0512)      LOS: 3 days   Marinda Elk  [facility]  12/21/2022, 10:16 AM

## 2022-12-21 NOTE — Progress Notes (Signed)
Nutrition Brief Note  Patient identified on the Malnutrition Screening Tool (MST) Report  Wt Readings from Last 15 Encounters:  12/18/22 78.9 kg  12/12/22 83 kg  09/04/22 83 kg  06/08/22 83 kg  05/29/22 72.6 kg  03/14/21 75.3 kg  11/28/19 74.8 kg  08/19/19 72.6 kg  05/19/19 72.6 kg  02/25/19 72.9 kg  01/01/19 73.6 kg  10/03/18 73 kg  09/05/18 74.4 kg  07/03/18 60.3 kg  06/20/18 61.6 kg    Body mass index is 23.59 kg/m. Patient meets criteria for normal  based on current BMI.   Current diet order is NPO, patient is consuming approximately 100% of meals at this time. Labs and medications reviewed.   No nutrition interventions warranted at this time. If nutrition issues arise, please consult RD.   Leodis Rains, RDN, LDN  Clinical Nutrition

## 2022-12-21 NOTE — Progress Notes (Signed)
Verified with Dr Wynona Neat that pt may go back to floor after xray done, MD reviewed xray and reports pt okay to return to his floor bed

## 2022-12-21 NOTE — Anesthesia Preprocedure Evaluation (Signed)
Anesthesia Evaluation  Patient identified by MRN, date of birth, ID band Patient awake  General Assessment Comment:42 year old male with a past medical history of HIV/AIDS (last CD4 count 258, HIV quant 89 on 12/14/2022), alcohol use disorder, protein calorie malnutrition, history of cryptococcal meningitis   Reviewed: Allergy & Precautions, H&P , NPO status , Patient's Chart, lab work & pertinent test results  Airway Mallampati: II  TM Distance: >3 FB Neck ROM: Full    Dental no notable dental hx.    Pulmonary Current Smoker and Patient abstained from smoking.  Decreased BS on L  breath sounds clear to auscultation + decreased breath sounds      Cardiovascular negative cardio ROS Normal cardiovascular exam Rhythm:Regular Rate:Normal     Neuro/Psych negative neurological ROS  negative psych ROS   GI/Hepatic ,GERD  ,,(+)     substance abuse  alcohol use  Endo/Other  negative endocrine ROS    Renal/GU negative Renal ROS  negative genitourinary   Musculoskeletal negative musculoskeletal ROS (+)    Abdominal   Peds negative pediatric ROS (+)  Hematology  (+) HIV  Anesthesia Other Findings   Reproductive/Obstetrics negative OB ROS                             Anesthesia Physical Anesthesia Plan  ASA: 4  Anesthesia Plan: General   Post-op Pain Management: Minimal or no pain anticipated   Induction: Intravenous  PONV Risk Score and Plan: 1 and Ondansetron and Treatment may vary due to age or medical condition  Airway Management Planned: Oral ETT  Additional Equipment:   Intra-op Plan:   Post-operative Plan: Possible Post-op intubation/ventilation  Informed Consent: I have reviewed the patients History and Physical, chart, labs and discussed the procedure including the risks, benefits and alternatives for the proposed anesthesia with the patient or authorized representative who has  indicated his/her understanding and acceptance.     Dental advisory given  Plan Discussed with: CRNA and Surgeon  Anesthesia Plan Comments: (Due to compressed L upper lobe and significant atelectasis in LLL there is a possibility that we will be unable to extubate)       Anesthesia Quick Evaluation

## 2022-12-21 NOTE — Progress Notes (Signed)
NAME:  Christopher Henson, MRN:  [id number], DOB:  08/05/1980, LOS: 3 ADMISSION DATE:  12/18/2022, CONSULTATION DATE: 12/18/2022 REFERRING MD: Dr. Sanda Klein, CHIEF COMPLAINT: Pleural effusion  History of Present Illness:  42 year old male with a past medical history of HIV/AIDS (last CD4 count 258, HIV quant 89 on 12/14/2022), alcohol use disorder, protein calorie malnutrition, history of cryptococcal meningitis who presented to the emergency department on 12/18/2022 with chest pain and shortness of breath.  On arrival he states that the symptoms had began the night before.  It was noted that he was seen in the emergency department recently on 12/12/2022 and had a chest x-ray that was concerning for a mediastinal mass.  He had a CT but he left AMA.  Today in the emergency department he had repeated CT imaging showed no pulmonary embolus.  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  There was associated mass effect on the left upper lobe pulmonary artery.  Also noted to have a left upper lobe pulmonary nodule that is 1.6 x 1.2 cm.  Noted to have a left-sided pleural effusion.  He did have a CT abdomen pelvis as well which showed no metastatic disease in the abdomen or pelvis.  Labs with WBC 11.2, K of 3.1, negative respiratory panel, lactic 2.7, negative troponin.  He was admitted to hospitalist.  Hospitalist consulted PCCM for thoracentesis of the left pleural effusion.  On my interview, the patient states that he has not had his Prezcobix medication in 2 months and has not been able to take his Biktarvy over the past 2 weeks related to cost and insurance.  He notes that he has felt mildly short of breath and has had progressively worsening symptoms over the past 2 weeks.  Also notes that he has had about 10 to 15 pounds weight loss over the past 1 to 2 months, night sweats.  Denies having overt fevers, diarrhea.  Has not noticed any lymphadenopathy.  Denies any new cough or sputum  production, sick contacts, recent travel.   Pertinent  Medical History  HIV/AIDS (last CD4 count 258, HIV quant 89 on 12/14/2022), alcohol use disorder, protein calorie malnutrition, history of cryptococcal meningitis   Significant Hospital Events: Including procedures, antibiotic start and stop dates in addition to other pertinent events   12/18/22 underwent thoracentesis 12/18/22 Chest CT with large mediastinal mass 8 x 7.5, hilar adenopathy, narrowing of the left upper lobe, infiltrate  Interim History / Subjective:  No overnight events Denies any pain or discomfort  Cytology results from pleural fluid still pending  Objective   Blood pressure (!) 123/96, pulse 79, temperature 98.3 F (36.8 C), temperature source Oral, resp. rate 17, height 6' (1.829 m), weight 78.9 kg, SpO2 98%.        Intake/Output Summary (Last 24 hours) at 12/21/2022 1206 Last data filed at 12/21/2022 6962 Gross per 24 hour  Intake 899.86 ml  Output --  Net 899.86 ml   Filed Weights   12/18/22 2115  Weight: 78.9 kg    Examination: General: Middle-age, does not appear to be in distress HENT: Moist oral mucosa Lungs: Decreased air movement at the bases Cardiovascular: S1-S2 appreciated Abdomen: Soft, bowel sounds appreciated Extremities: No clubbing, no edema Neuro: Alert and oriented x 3 GU:   Resolved Hospital Problem list     Assessment & Plan:   S/p thoracentesis for left pleural effusion -Exudative effusion Cytology still pending  Mediastinal mass with adenopathy -Awaiting cytology -Axillary  nodes too small for intervention  Concern for endobronchial narrowing and an extrinsic mass -For bronchoscopy today with EBUS  HIV infection ID continues to follow  Alcohol use disorder  Virl Diamond, MD [facility] Pager: See Loretha Stapler

## 2022-12-21 NOTE — Transfer of Care (Signed)
Immediate Anesthesia Transfer of Care Note  Patient: Christopher Henson  Procedure(s) Performed: VIDEO BRONCHOSCOPY WITH FLUORO, endobronchial ultrasound (Left) ENDOBRONCHIAL ULTRASOUND (Left) BRONCHIAL WASHINGS FINE NEEDLE ASPIRATION (FNA) LINEAR BIOPSY  Patient Location: Endoscopy Unit  Anesthesia Type:General  Level of Consciousness: awake, alert , and oriented  Airway & Oxygen Therapy: Patient Spontanous Breathing and Patient connected to face mask oxygen  Post-op Assessment: Report given to RN  Post vital signs: Reviewed and stable  Last Vitals:  Vitals Value Taken Time  BP 108/64 12/21/22 1453  Temp 36.4 C 12/21/22 1453  Pulse 88 12/21/22 1456  Resp 22 12/21/22 1456  SpO2 93 % 12/21/22 1456  Vitals shown include unfiled device data.  Last Pain:  Vitals:   12/21/22 1453  TempSrc: Temporal  PainSc: 0-No pain      Patients Stated Pain Goal: 1 (12/20/22 2018)  Complications: No notable events documented.

## 2022-12-22 ENCOUNTER — Ambulatory Visit: Payer: 59 | Admitting: Family

## 2022-12-22 ENCOUNTER — Inpatient Hospital Stay (HOSPITAL_COMMUNITY): Payer: 59

## 2022-12-22 DIAGNOSIS — J9859 Other diseases of mediastinum, not elsewhere classified: Secondary | ICD-10-CM | POA: Diagnosis not present

## 2022-12-22 DIAGNOSIS — B20 Human immunodeficiency virus [HIV] disease: Secondary | ICD-10-CM | POA: Diagnosis not present

## 2022-12-22 HISTORY — PX: IR LUMBAR PUNCTURE: IMG944

## 2022-12-22 LAB — ACID FAST SMEAR (AFB, MYCOBACTERIA)
Acid Fast Smear: NEGATIVE
Acid Fast Smear: NEGATIVE

## 2022-12-22 LAB — CSF CELL COUNT WITH DIFFERENTIAL
RBC Count, CSF: 138 /mm3 — ABNORMAL HIGH
Tube #: 4
WBC, CSF: 2 /mm3 (ref 0–5)

## 2022-12-22 LAB — CRYPTOCOCCAL ANTIGEN, CSF: Crypto Ag: NEGATIVE

## 2022-12-22 LAB — CHOLESTEROL, BODY FLUID: Cholesterol, Fluid: 89 mg/dL

## 2022-12-22 LAB — MISC LABCORP TEST (SEND OUT): Labcorp test code: 190256

## 2022-12-22 LAB — PROTEIN AND GLUCOSE, CSF
Glucose, CSF: 76 mg/dL — ABNORMAL HIGH (ref 40–70)
Total  Protein, CSF: 27 mg/dL (ref 15–45)

## 2022-12-22 LAB — BLASTOMYCES ANTIGEN: Blastomyces Antigen: NOT DETECTED ng/mL

## 2022-12-22 MED ORDER — FENTANYL CITRATE (PF) 100 MCG/2ML IJ SOLN
INTRAMUSCULAR | Status: AC
  Filled 2022-12-22: qty 2

## 2022-12-22 MED ORDER — MIDAZOLAM HCL 2 MG/2ML IJ SOLN
INTRAMUSCULAR | Status: AC
  Filled 2022-12-22: qty 2

## 2022-12-22 MED ORDER — FLUCONAZOLE 100 MG PO TABS
200.0000 mg | ORAL_TABLET | Freq: Every day | ORAL | Status: DC
  Administered 2022-12-22 – 2023-01-03 (×13): 200 mg via ORAL
  Filled 2022-12-22 (×13): qty 2

## 2022-12-22 MED ORDER — HYDROCODONE-ACETAMINOPHEN 7.5-325 MG PO TABS
1.0000 | ORAL_TABLET | Freq: Four times a day (QID) | ORAL | Status: DC | PRN
  Administered 2022-12-22 – 2022-12-25 (×9): 1 via ORAL
  Filled 2022-12-22 (×9): qty 1

## 2022-12-22 MED ORDER — LIDOCAINE HCL (PF) 2 % IJ SOLN
INTRAMUSCULAR | Status: AC
  Filled 2022-12-22: qty 10

## 2022-12-22 MED ORDER — LIDOCAINE HCL (PF) 1 % IJ SOLN
10.0000 mL | Freq: Once | INTRAMUSCULAR | Status: AC
  Administered 2022-12-22: 5 mL via INTRADERMAL

## 2022-12-22 NOTE — Procedures (Signed)
PROCEDURE SUMMARY:  Successful fluoroscopic guided lumbar puncture , L 2-3 level No immediate complications.  Pt tolerated well.   16 mL clear CSF removed. Sent for preordered labs.  Opening pressure 21 cm water  EBL < 2 cc.  Please see full dictation in imaging section of Epic for procedure details.  Dylan Suttle,MD

## 2022-12-22 NOTE — Plan of Care (Signed)
  Problem: Activity: Goal: Ability to tolerate increased activity will improve Outcome: Progressing   Problem: Clinical Measurements: Goal: Ability to maintain a body temperature in the normal range will improve Outcome: Progressing   Problem: Respiratory: Goal: Ability to maintain adequate ventilation will improve Outcome: Progressing Goal: Ability to maintain a clear airway will improve Outcome: Progressing   Problem: Clinical Measurements: Goal: Respiratory complications will improve Outcome: Progressing Goal: Cardiovascular complication will be avoided Outcome: Progressing   Problem: Activity: Goal: Risk for activity intolerance will decrease Outcome: Progressing   Problem: Nutrition: Goal: Adequate nutrition will be maintained Outcome: Progressing   Problem: Coping: Goal: Level of anxiety will decrease Outcome: Progressing   Problem: Elimination: Goal: Will not experience complications related to bowel motility Outcome: Progressing Goal: Will not experience complications related to urinary retention Outcome: Progressing   Problem: Pain Managment: Goal: General experience of comfort will improve Outcome: Progressing   Problem: Safety: Goal: Ability to remain free from injury will improve Outcome: Progressing   Problem: Skin Integrity: Goal: Risk for impaired skin integrity will decrease Outcome: Progressing

## 2022-12-22 NOTE — Plan of Care (Signed)
  Problem: Education: Goal: Knowledge of General Education information will improve Description: Including pain rating scale, medication(s)/side effects and non-pharmacologic comfort measures Outcome: Progressing   Problem: Clinical Measurements: Goal: Ability to maintain clinical measurements within normal limits will improve Outcome: Progressing   Problem: Health Behavior/Discharge Planning: Goal: Ability to manage health-related needs will improve Outcome: Not Progressing   Problem: Coping: Goal: Level of anxiety will decrease Outcome: Not Progressing

## 2022-12-22 NOTE — Progress Notes (Signed)
[facility] PROGRESS NOTE    Progress Note  Christopher Henson  GMW:[id number] DOB: 1980/08/16 DOA: 12/18/2022 PCP: [name], PA-C     Brief Narrative:   Christopher Henson is an 42 y.o. 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, cryptococcal meningitis alcohol abuse comes in with chest pain shortness of breath and generalized weakness with unintentional weight loss, he relates has not been able to take his antiviral medication for about a week due to cost in the ED chest x-ray showed mediastinal mass he was recommended to have a CTA but left AGAINST MEDICAL ADVICE came back to the ED, CT angio showed no PE but showed an 8 x 7 anterior mediastinal mass in the left hilar region with mass affect on the left upper lobe pulmonary artery, left upper lobe pulmonary nodule 1.6 x 1.2 cm and small left increased size pleural effusion CT scan of the abdomen pelvis showed no evidence of metastatic disease in the abdomen and pelvis, CT of the head showed no acute findings.  Urinary was consulted and bronchoscopy scheduled for 12/21/2022. ID is on board, currently on IV Unasyn.  Assessment/Plan:   Anterior large Mediastinal mass/lymphadenopathy/groundglass opacity in the left upper lung/acute respiratory failure/left pleural effusion: Respiratory failure is resolved. Status post thoracocentesis 800 cc culture negative to date exudative effusion. Pulmonary was consulted status post bronchoscopy on 12/21/2022 that showed normal examination of airway bronchoalveolar lavage was performed ultrasound guided biopsy sampling of lymph nodes was performed. Currently on IV Unasyn. Cytologies are pending. Further management per pulmonary.  HIV: With a CD4 count of 258. Noncompliance with his medication. ID was consulted recommended IV Unasyn and started on Biktarvy.  Positive cryptococcal antigen: ID recommended fluoroscopy guided lumbar puncture with opening pressure, is pending. Analysis sent for  glucose protein cell count Gram stain cryptococcal antigen and culture.  Tobacco abuse: Nicotine patch was described.  Alcohol use: No signs of withdrawal continue thiamine and folate.  Noncompliance: Has been counseled. According to patient, he missed about 2  weeks of his cryptococcal meningitis treatment.  Hypokalemia: Repleted now resolved.  Blood cultures positive : For Staph epidermidis 1 out of 4 likely contaminant.   DVT prophylaxis: none Family Communication:none Status is: Inpatient Remains inpatient appropriate because: Mediastinal mass    Code Status:     Code Status Orders  (From admission, onward)           Start     Ordered   12/18/22 0828  Full code  Continuous       Question:  By:  Answer:  Consent: discussion documented in EHR   12/18/22 0828           Code Status History     Date Active Date Inactive Code Status Order ID Comments User Context   06/08/2018 0855 06/21/2018 1833 Full Code [id number]  Rolly Salter, MD Inpatient         IV Access:   Peripheral IV   Procedures and diagnostic studies:   DG Chest Port 1 View  Result Date: 12/21/2022 CLINICAL DATA:  Status post bronchoscopy, cough EXAM: PORTABLE CHEST 1 VIEW COMPARISON:  CTA chest 3 days prior FINDINGS: The heart size is stable. The large left upper mediastinal mass is not significantly changed. The left pleural effusion has increased in size, with worsened aeration of the left lower lobe since the radiograph from 3 days prior. The right lung is clear. There is no right effusion. There is no pneumothorax There is  no acute osseous abnormality. IMPRESSION: 1. Unchanged large mediastinal mass. 2. Increased size of the left pleural effusion with worsened aeration of the left lower lobe. Electronically Signed   By: Lesia Hausen M.D.   On: 12/21/2022 16:16   DG C-ARM BRONCHOSCOPY  Result Date: 12/21/2022 C-ARM BRONCHOSCOPY: Fluoroscopy was utilized by the requesting physician.   No radiographic interpretation.   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 RADIATION DOSE REDUCTION: This exam was performed according to the departmental dose-optimization program which includes automated exposure control, adjustment of the mA and/or kV according to patient size and/or use of iterative reconstruction technique. COMPARISON:  None Available. FINDINGS: Brain: No evidence of acute infarction, hemorrhage, hydrocephalus, extra-axial collection or mass lesion/mass effect. Vascular: No hyperdense vessel or unexpected calcification. Skull: Normal. Negative for fracture or focal lesion. Sinuses/Orbits: No middle ear or mastoid effusion. Paranasal sinuses are clear. Orbits are unremarkable. Other: None. IMPRESSION: No acute intracranial abnormality. Electronically Signed   By: Lorenza Cambridge M.D.   On: 12/20/2022 14:31     Medical Consultants:   None.   Subjective:    Christopher Henson feels better no new complaints  Objective:    Vitals:   12/21/22 1537 12/21/22 2030 12/22/22 0602 12/22/22 0743  BP: 115/78 (!) 155/108 (!) 148/97   Pulse: 83 (!) 109 83   Resp: (!) 22 15 14 17   Temp: 98.4 F (36.9 C) 99.6 F (37.6 C) 98 F (36.7 C)   TempSrc: [place]   SpO2: 96% 95% 99%   Weight:      Height:       SpO2: 99 % O2 Flow Rate (L/min): 2 L/min   Intake/Output Summary (Last 24 hours) at 12/22/2022 0816 Last data filed at 12/22/2022 0600 Gross per 24 hour  Intake 926.83 ml  Output 500 ml  Net 426.83 ml   Filed Weights   12/18/22 2115  Weight: 78.9 kg    Exam: General exam: In no acute distress. Respiratory system: Good air movement and clear to auscultation. Cardiovascular system: S1 & S2 heard, RRR. No  JVD. Gastrointestinal system: Abdomen is nondistended, soft and nontender.  Extremities: No pedal edema. Skin: No rashes, lesions or ulcers Psychiatry: Judgement and insight appear normal. Mood & affect appropriate. Data Reviewed:    Labs: Basic Metabolic Panel: Recent Labs  Lab 12/18/22 0226 12/18/22 0251 12/19/22 0904 12/20/22 1002 12/21/22 1813  NA 136 139 138 139 138  K 3.1* 3.4* 4.1 3.9 3.8  CL 103 105 105 102 102  CO2 24  --  23 27 25   GLUCOSE 133* 123* 107* 96 193*  BUN 12 12 6 9 11   CREATININE 1.06 1.20 1.04 1.16 1.37*  CALCIUM 8.5*  --  8.5* 9.0 9.1  MG  --   --  2.2 2.3 2.4  PHOS  --   --  2.5 3.1 2.2*   GFR Estimated Creatinine Clearance: 75.5 mL/min (A) (by C-G formula based on SCr of 1.37 mg/dL (H)). Liver Function Tests: Recent Labs  Lab 12/18/22 0226 12/18/22 1312 12/19/22 0904 12/20/22 1002 12/21/22 1813  AST 24  --  21  --   --   ALT 19  --  19  --   --   ALKPHOS 77  --  74  --   --  BILITOT 1.0  --  1.0  --   --   PROT 6.9 6.7 6.6  --   --   ALBUMIN 3.7  --  3.5 4.0 4.2   No results for input(s): "LIPASE", "AMYLASE" in the last 168 hours. No results for input(s): "AMMONIA" in the last 168 hours. Coagulation profile Recent Labs  Lab 12/18/22 0226  INR 1.0   COVID-19 Labs  No results for input(s): "DDIMER", "FERRITIN", "LDH", "CRP" in the last 72 hours.   Lab Results  Component Value Date   SARSCOV2NAA NEGATIVE 12/18/2022   SARSCOV2NAA Not Detected 12/12/2019    CBC: Recent Labs  Lab 12/18/22 0226 12/18/22 0251 12/19/22 0904 12/20/22 1002 12/21/22 1813  WBC 11.2*  --  6.2 6.4 10.2  HGB 16.3 16.7 16.3 18.2* 17.2*  HCT 45.3 49.0 46.6 52.1* 49.0  MCV 89.7  --  90.3 91.2 90.7  PLT 220  --  209 220 226   Cardiac Enzymes: No results for input(s): "CKTOTAL", "CKMB", "CKMBINDEX", "TROPONINI" in the last 168 hours. BNP (last 3 results) No results for input(s): "PROBNP" in the last 8760 hours. CBG: No results for input(s):  "GLUCAP" in the last 168 hours. D-Dimer: No results for input(s): "DDIMER" in the last 72 hours. Hgb A1c: No results for input(s): "HGBA1C" in the last 72 hours. Lipid Profile: No results for input(s): "CHOL", "HDL", "LDLCALC", "TRIG", "CHOLHDL", "LDLDIRECT" in the last 72 hours.  Thyroid function studies: No results for input(s): "TSH", "T4TOTAL", "T3FREE", "THYROIDAB" in the last 72 hours.  Invalid input(s): "FREET3" Anemia work up: No results for input(s): "VITAMINB12", "FOLATE", "FERRITIN", "TIBC", "IRON", "RETICCTPCT" in the last 72 hours. Sepsis Labs: Recent Labs  Lab 12/18/22 0226 12/18/22 0256 12/18/22 0551 12/19/22 0904 12/20/22 1002 12/21/22 1813  WBC 11.2*  --   --  6.2 6.4 10.2  LATICACIDVEN  --  2.7* 1.7  --   --   --    Microbiology Recent Results (from the past 240 hour(s))  Resp panel by RT-PCR (RSV, Flu A&B, Covid) Anterior Nasal Swab     Status: None   Collection Time: 12/18/22  2:35 AM   Specimen: Anterior Nasal Swab  Result Value Ref Range Status   SARS Coronavirus 2 by RT PCR NEGATIVE NEGATIVE Final    Comment: (NOTE) SARS-CoV-2 target nucleic acids are NOT DETECTED.  The SARS-CoV-2 RNA is generally detectable in upper respiratory specimens during the acute phase of infection. The lowest concentration of SARS-CoV-2 viral copies this assay can detect is 138 copies/mL. A negative result does not preclude SARS-Cov-2 infection and should not be used as the sole basis for treatment or other patient management decisions. A negative result may occur with  improper specimen collection/handling, submission of specimen other than nasopharyngeal swab, presence of viral mutation(s) within the areas targeted by this assay, and inadequate number of viral copies(<138 copies/mL). A negative result must be combined with clinical observations, patient history, and epidemiological information. The expected result is Negative.  Fact Sheet for Patients:   BloggerCourse.com  Fact Sheet for Healthcare Providers:  SeriousBroker.it  This test is no t yet approved or cleared by the [place] FDA and  has been authorized for detection and/or diagnosis of SARS-CoV-2 by FDA under an Emergency Use Authorization (EUA). This EUA will remain  in effect (meaning this test can be used) for the duration of the COVID-19 declaration under Section 564(b)(1) of the Act, 21 U.S.C.section 360bbb-3(b)(1), unless the authorization is terminated  or revoked sooner.  Influenza A by PCR NEGATIVE NEGATIVE Final   Influenza B by PCR NEGATIVE NEGATIVE Final    Comment: (NOTE) The Xpert Xpress SARS-CoV-2/FLU/RSV plus assay is intended as an aid in the diagnosis of influenza from Nasopharyngeal swab specimens and should not be used as a sole basis for treatment. Nasal washings and aspirates are unacceptable for Xpert Xpress SARS-CoV-2/FLU/RSV testing.  Fact Sheet for Patients: BloggerCourse.com  Fact Sheet for Healthcare Providers: SeriousBroker.it  This test is not yet approved or cleared by the [place] FDA and has been authorized for detection and/or diagnosis of SARS-CoV-2 by FDA under an Emergency Use Authorization (EUA). This EUA will remain in effect (meaning this test can be used) for the duration of the COVID-19 declaration under Section 564(b)(1) of the Act, 21 U.S.C. section 360bbb-3(b)(1), unless the authorization is terminated or revoked.     Resp Syncytial Virus by PCR NEGATIVE NEGATIVE Final    Comment: (NOTE) Fact Sheet for Patients: BloggerCourse.com  Fact Sheet for Healthcare Providers: SeriousBroker.it  This test is not yet approved or cleared by the [place] FDA and has been authorized for detection and/or diagnosis of SARS-CoV-2 by FDA under an Emergency Use  Authorization (EUA). This EUA will remain in effect (meaning this test can be used) for the duration of the COVID-19 declaration under Section 564(b)(1) of the Act, 21 U.S.C. section 360bbb-3(b)(1), unless the authorization is terminated or revoked.  Performed at [facility], [address], [place], [place] [address]   Blood Culture (routine x 2)     Status: Abnormal   Collection Time: 12/18/22  2:50 AM   Specimen: BLOOD  Result Value Ref Range Status   Specimen Description   Final    BLOOD RIGHT ANTECUBITAL Performed at [facility], [address] [place], [place] [address]    Special Requests   Final    Blood Culture adequate volume BOTTLES DRAWN AEROBIC AND ANAEROBIC Performed at [facility], [address], [place], [place] [address]    Culture  Setup Time   Final    GRAM POSITIVE COCCI ANAEROBIC BOTTLE ONLY CRITICAL RESULT CALLED TO, READ BACK BY AND VERIFIED WITH: PHARMD JUSTIN LEGGE [id number] AT 0829 BY EC    Culture (A)  Final    STAPHYLOCOCCUS EPIDERMIDIS THE SIGNIFICANCE OF ISOLATING THIS ORGANISM FROM A SINGLE SET OF BLOOD CULTURES WHEN MULTIPLE SETS ARE DRAWN IS UNCERTAIN. PLEASE NOTIFY THE MICROBIOLOGY DEPARTMENT WITHIN ONE WEEK IF SPECIATION AND SENSITIVITIES ARE REQUIRED. Performed at [facility], [address], [place], [place] [address]    Report Status 12/21/2022 FINAL  Final  Blood Culture ID Panel (Reflexed)     Status: Abnormal   Collection Time: 12/18/22  2:50 AM  Result Value Ref Range Status   Enterococcus faecalis NOT DETECTED NOT DETECTED Final   Enterococcus Faecium NOT DETECTED NOT DETECTED Final   Listeria monocytogenes NOT DETECTED NOT DETECTED Final   Staphylococcus species DETECTED (A) NOT DETECTED Final    Comment: CRITICAL RESULT CALLED TO, READ BACK BY AND VERIFIED WITH: PHARMD JUSTIN LEGGE [id number] AT 0829 BY EC    Staphylococcus aureus (BCID) NOT DETECTED NOT DETECTED  Final   Staphylococcus epidermidis DETECTED (A) NOT DETECTED Final    Comment: CRITICAL RESULT CALLED TO, READ BACK BY AND VERIFIED WITH: PHARMD JUSTIN LEGGE [id number] 0829 BY EC    Staphylococcus lugdunensis NOT DETECTED NOT DETECTED Final   Streptococcus species NOT DETECTED NOT DETECTED Final   Streptococcus agalactiae  NOT DETECTED NOT DETECTED Final   Streptococcus pneumoniae NOT DETECTED NOT DETECTED Final   Streptococcus pyogenes NOT DETECTED NOT DETECTED Final   A.calcoaceticus-baumannii NOT DETECTED NOT DETECTED Final   Bacteroides fragilis NOT DETECTED NOT DETECTED Final   Enterobacterales NOT DETECTED NOT DETECTED Final   Enterobacter cloacae complex NOT DETECTED NOT DETECTED Final   Escherichia coli NOT DETECTED NOT DETECTED Final   Klebsiella aerogenes NOT DETECTED NOT DETECTED Final   Klebsiella oxytoca NOT DETECTED NOT DETECTED Final   Klebsiella pneumoniae NOT DETECTED NOT DETECTED Final   Proteus species NOT DETECTED NOT DETECTED Final   Salmonella species NOT DETECTED NOT DETECTED Final   Serratia marcescens NOT DETECTED NOT DETECTED Final   Haemophilus influenzae NOT DETECTED NOT DETECTED Final   Neisseria meningitidis NOT DETECTED NOT DETECTED Final   Pseudomonas aeruginosa NOT DETECTED NOT DETECTED Final   Stenotrophomonas maltophilia NOT DETECTED NOT DETECTED Final   Candida albicans NOT DETECTED NOT DETECTED Final   Candida auris NOT DETECTED NOT DETECTED Final   Candida glabrata NOT DETECTED NOT DETECTED Final   Candida krusei NOT DETECTED NOT DETECTED Final   Candida parapsilosis NOT DETECTED NOT DETECTED Final   Candida tropicalis NOT DETECTED NOT DETECTED Final   Cryptococcus neoformans/gattii NOT DETECTED NOT DETECTED Final   Methicillin resistance mecA/C NOT DETECTED NOT DETECTED Final    Comment: Performed at [facility], [address] [place], [place] [address]  Blood Culture (routine x 2)     Status: None (Preliminary result)   Collection  Time: 12/18/22  2:55 AM   Specimen: BLOOD  Result Value Ref Range Status   Specimen Description   Final    BLOOD LEFT ANTECUBITAL Performed at [facility], [address], [place], [place] [address]    Special Requests   Final    Blood Culture adequate volume BOTTLES DRAWN AEROBIC AND ANAEROBIC Performed at [facility], [address], [place], [place] [address]    Culture   Final    NO GROWTH 3 DAYS Performed at [facility], [address], [place], [place] [address]    Report Status PENDING  Incomplete  Pleural fluid culture w Gram Stain     Status: None   Collection Time: 12/18/22  1:12 PM   Specimen: Pleural Fluid  Result Value Ref Range Status   Specimen Description   Final    PLEURAL Performed at [facility], [address] [place], [place] [address]    Special Requests   Final    Immunocompromised Performed at [facility], [address] [place], [place] [address]    Gram Stain   Final    FEW WBC PRESENT, PREDOMINANTLY MONONUCLEAR NO ORGANISMS SEEN    Culture   Final    NO GROWTH 3 DAYS Performed at [facility], [address] [place], [place] [address]    Report Status 12/21/2022 FINAL  Final  Acid Fast Smear (AFB)     Status: None   Collection Time: 12/18/22  1:12 PM   Specimen: Pleural Fluid  Result Value Ref Range Status   AFB Specimen Processing Concentration  Final   Acid Fast Smear Negative  Final    Comment: (NOTE) Performed At: [facility] [address] [place], [place] [id number] Jolene Schimke MD IR:[id number]    Source (AFB) PLEURAL  Final    Comment: Performed at [facility], [address] [place], [place]  [address]  Culture, Respiratory w Gram Stain     Status: None (Preliminary result)   Collection Time: 12/21/22  2:05 PM   Specimen: Bronchial Alveolar Lavage; Respiratory  Result Value Ref Range Status   Specimen  Description   Final    BRONCHIAL ALVEOLAR LAVAGE LEFT UPPER LOBE Performed at [facility], [address] [place], [place] [address]    Special Requests   Final    BRONCHIAL ALVEOLAR LAVAGE LEFT UPPER LOBE Performed at [facility], [address] [place], [place] [address]    Gram Stain   Final    RARE WBC PRESENT, PREDOMINANTLY MONONUCLEAR NO ORGANISMS SEEN Performed at [facility], [address] [place], [place] [address]    Culture PENDING  Incomplete   Report Status PENDING  Incomplete  Culture, Respiratory w Gram Stain     Status: None (Preliminary result)   Collection Time: 12/21/22  2:05 PM   Specimen: Bronchial Alveolar Lavage; Respiratory  Result Value Ref Range Status   Specimen Description   Final    BRONCHIAL ALVEOLAR LAVAGE LEFT LOWER LOBE Performed at [facility], [address] [place], [place] [address]    Special Requests   Final    BRONCHIAL ALVEOLAR LAVAGE LEFT LOWER LOBE Performed at [facility], [address], [place], [place] [address]    Gram Stain   Final    NO WBC SEEN NO ORGANISMS SEEN Performed at [facility], [address] [place], [place] [address]    Culture PENDING  Incomplete   Report Status PENDING  Incomplete  Anaerobic culture w Gram Stain     Status: None (Preliminary result)   Collection Time: 12/21/22  2:05 PM   Specimen: Bronchoalveolar Lavage  Result Value Ref Range Status   Specimen Description BRONCHIAL ALVEOLAR LAVAGE  Final   Special Requests LEFT LOWER LOBE  Final   Gram Stain   Final    WBC PRESENT, PREDOMINANTLY MONONUCLEAR NO ORGANISMS SEEN CYTOSPIN SMEAR Performed at [facility], [address], [place], [place] [address]    Culture PENDING  Incomplete   Report Status PENDING  Incomplete  Anaerobic culture w Gram Stain     Status: None (Preliminary result)   Collection Time: 12/21/22  2:05 PM   Specimen:  Bronchoalveolar Lavage  Result Value Ref Range Status   Specimen Description BRONCHIAL ALVEOLAR LAVAGE  Final   Special Requests LEFT UPPER LOBE  Final   Gram Stain   Final    WBC PRESENT, PREDOMINANTLY MONONUCLEAR NO ORGANISMS SEEN CYTOSPIN SMEAR Performed at [facility], [address], [place], [place] [address]    Culture PENDING  Incomplete   Report Status PENDING  Incomplete     Medications:    bictegravir-emtricitabine-tenofovir AF  1 tablet Oral Daily   enoxaparin (LOVENOX) injection  40 mg Subcutaneous Q24H   Continuous Infusions:  ampicillin-sulbactam (UNASYN) IV 3 g (12/22/22 0432)      LOS: 4 days   Marinda Elk  [facility]  12/22/2022, 8:16 AM

## 2022-12-22 NOTE — Plan of Care (Signed)
?  Problem: Education: ?Goal: Knowledge of General Education information will improve ?Description: Including pain rating scale, medication(s)/side effects and non-pharmacologic comfort measures ?Outcome: Progressing ?  ?Problem: Clinical Measurements: ?Goal: Will remain free from infection ?Outcome: Progressing ?Goal: Diagnostic test results will improve ?Outcome: Progressing ?Goal: Respiratory complications will improve ?Outcome: Progressing ?  ?Problem: Coping: ?Goal: Level of anxiety will decrease ?Outcome: Progressing ?  ?

## 2022-12-22 NOTE — Anesthesia Postprocedure Evaluation (Signed)
Anesthesia Post Note  Patient: Christopher Henson  Procedure(s) Performed: VIDEO BRONCHOSCOPY WITH FLUORO, endobronchial ultrasound (Left) ENDOBRONCHIAL ULTRASOUND (Left) BRONCHIAL WASHINGS FINE NEEDLE ASPIRATION (FNA) LINEAR BIOPSY     Patient location during evaluation: PACU Anesthesia Type: General Level of consciousness: awake and alert Pain management: pain level controlled Vital Signs Assessment: post-procedure vital signs reviewed and stable Respiratory status: spontaneous breathing, nonlabored ventilation, respiratory function stable and patient connected to nasal cannula oxygen Cardiovascular status: blood pressure returned to baseline and stable Postop Assessment: no apparent nausea or vomiting Anesthetic complications: no  No notable events documented.  Last Vitals:  Vitals:   12/22/22 1149 12/22/22 1220  BP:  (!) 155/97  Pulse:  74  Resp: 17   Temp:  36.8 C  SpO2:  100%    Last Pain:  Vitals:   12/22/22 1220  TempSrc: [place]  PainSc:                  Lavaughn Bisig S

## 2022-12-22 NOTE — Progress Notes (Signed)
NAME:  Christopher Henson, MRN:  [id number], DOB:  08-Sep-1980, LOS: 4 ADMISSION DATE:  12/18/2022, CONSULTATION DATE: 12/18/2022 REFERRING MD: Dr. Sanda Klein, CHIEF COMPLAINT: Pleural effusion  History of Present Illness:  42 year old male with a past medical history of HIV/AIDS (last CD4 count 258, HIV quant 89 on 12/14/2022), alcohol use disorder, protein calorie malnutrition, history of cryptococcal meningitis who presented to the emergency department on 12/18/2022 with chest pain and shortness of breath.  On arrival he states that the symptoms had began the night before.  It was noted that he was seen in the emergency department recently on 12/12/2022 and had a chest x-ray that was concerning for a mediastinal mass.  He had a CT but he left AMA.  Today in the emergency department he had repeated CT imaging showed no pulmonary embolus.  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  There was associated mass effect on the left upper lobe pulmonary artery.  Also noted to have a left upper lobe pulmonary nodule that is 1.6 x 1.2 cm.  Noted to have a left-sided pleural effusion.  He did have a CT abdomen pelvis as well which showed no metastatic disease in the abdomen or pelvis.  Labs with WBC 11.2, K of 3.1, negative respiratory panel, lactic 2.7, negative troponin.  He was admitted to hospitalist.  Hospitalist consulted PCCM for thoracentesis of the left pleural effusion.  On my interview, the patient states that he has not had his Prezcobix medication in 2 months and has not been able to take his Biktarvy over the past 2 weeks related to cost and insurance.  He notes that he has felt mildly short of breath and has had progressively worsening symptoms over the past 2 weeks.  Also notes that he has had about 10 to 15 pounds weight loss over the past 1 to 2 months, night sweats.  Denies having overt fevers, diarrhea.  Has not noticed any lymphadenopathy.  Denies any new cough or sputum  production, sick contacts, recent travel.   Pertinent  Medical History  HIV/AIDS (last CD4 count 258, HIV quant 89 on 12/14/2022), alcohol use disorder, protein calorie malnutrition, history of cryptococcal meningitis   Significant Hospital Events: Including procedures, antibiotic start and stop dates in addition to other pertinent events   12/18/22 underwent thoracentesis 12/18/22 Chest CT with large mediastinal mass 8 x 7.5, hilar adenopathy, narrowing of the left upper lobe, infiltrate 9/10 thora 9/12 EBUS  Interim History / Subjective:  No overnight events S/p EBUS 9/12  Cytology pleural fluid consistent SCLC  Objective   Blood pressure (!) 155/97, pulse 74, temperature 98.2 F (36.8 C), temperature source Oral, resp. rate 17, height 6' (1.829 m), weight 78.9 kg, SpO2 100%.        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

## 2022-12-23 DIAGNOSIS — J9859 Other diseases of mediastinum, not elsewhere classified: Secondary | ICD-10-CM | POA: Diagnosis not present

## 2022-12-23 LAB — CULTURE, BLOOD (ROUTINE X 2)
Culture: NO GROWTH
Special Requests: ADEQUATE

## 2022-12-23 NOTE — Progress Notes (Signed)
[facility] PROGRESS NOTE    Progress Note  Christopher Henson  YHC:[id number] DOB: [birth year] DOA: 12/18/2022 PCP: [name], PA-C     Brief Narrative:   Christopher Henson is an 42 y.o. 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, cryptococcal meningitis alcohol abuse comes in with chest pain shortness of breath and generalized weakness with unintentional weight loss, he relates has not been able to take his antiviral medication for about a week due to cost in the ED chest x-ray showed mediastinal mass he was recommended to have a CTA but left AGAINST MEDICAL ADVICE came back to the ED, CT angio showed no PE but showed an 8 x 7 anterior mediastinal mass in the left hilar region with mass affect on the left upper lobe pulmonary artery, left upper lobe pulmonary nodule 1.6 x 1.2 cm and small left increased size pleural effusion CT scan of the abdomen pelvis showed no evidence of metastatic disease in the abdomen and pelvis, CT of the head showed no acute findings.  Urinary was consulted and bronchoscopy scheduled for 12/21/2022. ID is on board, currently on IV Unasyn.  Assessment/Plan:   Anterior large Mediastinal mass/lymphadenopathy/groundglass opacity in the left upper lung/acute respiratory failure/left pleural effusion: Respiratory failure is resolved. Status post thoracocentesis 800 cc culture negative to date exudative effusion.  Cytology of pleural fluid shows malignant cells, consistent with small cell lung cancer Pulmonary was consulted status post bronchoscopy on 12/21/2022 biopsy results are pending. Currently on IV Unasyn. Will go ahead and consult oncology  HIV: With a CD4 count of 258. Noncompliance with his medication. ID was consulted recommended IV Unasyn and started on Biktarvy.  Positive cryptococcal antigen: ID recommended fluoroscopy guided lumbar puncture, opening pressure was 21 cm of water. Analysis sent for glucose protein cell count Gram stain  cryptococcal antigen and culture are pending Awaiting ID further recommendations.  Tobacco abuse: Nicotine patch was described.  Alcohol use: No signs of withdrawal continue thiamine and folate.  Noncompliance: Has been counseled. According to patient, he missed about 2  weeks of his cryptococcal meningitis treatment.  Hypokalemia: Repleted now resolved.  Blood cultures positive : For Staph epidermidis 1 out of 4 likely contaminant.   DVT prophylaxis: none Family Communication:none Status is: Inpatient Remains inpatient appropriate because: Mediastinal mass    Code Status:     Code Status Orders  (From admission, onward)           Start     Ordered   12/18/22 0828  Full code  Continuous       Question:  By:  Answer:  Consent: discussion documented in EHR   12/18/22 0828           Code Status History     Date Active Date Inactive Code Status Order ID Comments User Context   06/08/2018 0855 06/21/2018 1833 Full Code [id number]  Rolly Salter, MD Inpatient         IV Access:   Peripheral IV   Procedures and diagnostic studies:   IR LUMBAR PUNCTURE  Result Date: 12/22/2022 CLINICAL DATA:  42 year old male with history of cryptococcal meningitis. EXAM: DIAGNOSTIC LUMBAR PUNCTURE UNDER FLUOROSCOPIC GUIDANCE COMPARISON:  06/17/2018 FLUOROSCOPY TIME:  Three mGy PROCEDURE: Informed consent was obtained from the patient prior to the procedure, including potential complications of headache, allergy, and pain. With the patient prone, the lower back was prepped with Betadine. 1% Lidocaine was used for local anesthesia. Lumbar puncture was performed at the L2-L3  level using a 3.5 inch, 20 gauge needle with return of clear CSF with an opening pressure of 21 cm water. A total of 16 ml of CSF were obtained for laboratory studies. The patient tolerated the procedure well and there were no apparent complications. IMPRESSION: Technically successful fluoroscopic guided  lumbar puncture at L2-L3. Marliss Coots, MD [facility] Electronically Signed   By: Marliss Coots M.D.   On: 12/22/2022 15:42   DG Chest Port 1 View  Result Date: 12/21/2022 CLINICAL DATA:  Status post bronchoscopy, cough EXAM: PORTABLE CHEST 1 VIEW COMPARISON:  CTA chest 3 days prior FINDINGS: The heart size is stable. The large left upper mediastinal mass is not significantly changed. The left pleural effusion has increased in size, with worsened aeration of the left lower lobe since the radiograph from 3 days prior. The right lung is clear. There is no right effusion. There is no pneumothorax There is no acute osseous abnormality. IMPRESSION: 1. Unchanged large mediastinal mass. 2. Increased size of the left pleural effusion with worsened aeration of the left lower lobe. Electronically Signed   By: Lesia Hausen M.D.   On: 12/21/2022 16:16   DG C-ARM BRONCHOSCOPY  Result Date: 12/21/2022 C-ARM BRONCHOSCOPY: Fluoroscopy was utilized by the requesting physician.  No radiographic interpretation.     Medical Consultants:   None.   Subjective:    Christopher Henson no new complaints  Objective:    Vitals:   12/22/22 1149 12/22/22 1220 12/22/22 1931 12/23/22 0414  BP:  (!) 155/97 (!) 133/91 (!) 147/101  Pulse:  74 88 66  Resp: 17  20 18   Temp:  98.2 F (36.8 C) 98.3 F (36.8 C) 98 F (36.7 C)  TempSrc:  Oral Oral Oral  SpO2:  100% 100% 99%  Weight:      Height:       SpO2: 99 % O2 Flow Rate (L/min): 2 L/min   Intake/Output Summary (Last 24 hours) at 12/23/2022 1055 Last data filed at 12/23/2022 0900 Gross per 24 hour  Intake 1640 ml  Output --  Net 1640 ml   Filed Weights   12/18/22 2115  Weight: 78.9 kg    Exam: General exam: In no acute distress. Respiratory system: Good air movement and clear to auscultation. Cardiovascular system: S1 & S2 heard, RRR. No JVD. Gastrointestinal system: Abdomen is nondistended,  soft and nontender.  Extremities: No pedal edema. Skin: No rashes, lesions or ulcers Psychiatry: Judgement and insight appear normal. Mood & affect appropriate.   Data Reviewed:    Labs: Basic Metabolic Panel: Recent Labs  Lab 12/18/22 0226 12/18/22 0251 12/19/22 0904 12/20/22 1002 12/21/22 1813  NA 136 139 138 139 138  K 3.1* 3.4* 4.1 3.9 3.8  CL 103 105 105 102 102  CO2 24  --  23 27 25   GLUCOSE 133* 123* 107* 96 193*  BUN 12 12 6 9 11   CREATININE 1.06 1.20 1.04 1.16 1.37*  CALCIUM 8.5*  --  8.5* 9.0 9.1  MG  --   --  2.2 2.3 2.4  PHOS  --   --  2.5 3.1 2.2*   GFR Estimated Creatinine Clearance: 75.5 mL/min (A) (by C-G formula based on SCr of 1.37 mg/dL (H)). Liver Function Tests: Recent Labs  Lab 12/18/22 0226 12/18/22 1312 12/19/22 0904 12/20/22 1002 12/21/22 1813  AST 24  --  21  --   --   ALT 19  --  19  --   --  ALKPHOS 77  --  74  --   --   BILITOT 1.0  --  1.0  --   --   PROT 6.9 6.7 6.6  --   --   ALBUMIN 3.7  --  3.5 4.0 4.2   No results for input(s): "LIPASE", "AMYLASE" in the last 168 hours. No results for input(s): "AMMONIA" in the last 168 hours. Coagulation profile Recent Labs  Lab 12/18/22 0226  INR 1.0   COVID-19 Labs  No results for input(s): "DDIMER", "FERRITIN", "LDH", "CRP" in the last 72 hours.   Lab Results  Component Value Date   SARSCOV2NAA NEGATIVE 12/18/2022   SARSCOV2NAA Not Detected 12/12/2019    CBC: Recent Labs  Lab 12/18/22 0226 12/18/22 0251 12/19/22 0904 12/20/22 1002 12/21/22 1813  WBC 11.2*  --  6.2 6.4 10.2  HGB 16.3 16.7 16.3 18.2* 17.2*  HCT 45.3 49.0 46.6 52.1* 49.0  MCV 89.7  --  90.3 91.2 90.7  PLT 220  --  209 220 226   Cardiac Enzymes: No results for input(s): "CKTOTAL", "CKMB", "CKMBINDEX", "TROPONINI" in the last 168 hours. BNP (last 3 results) No results for input(s): "PROBNP" in the last 8760 hours. CBG: No results for input(s): "GLUCAP" in the last 168 hours. D-Dimer: No results for  input(s): "DDIMER" in the last 72 hours. Hgb A1c: No results for input(s): "HGBA1C" in the last 72 hours. Lipid Profile: No results for input(s): "CHOL", "HDL", "LDLCALC", "TRIG", "CHOLHDL", "LDLDIRECT" in the last 72 hours.  Thyroid function studies: No results for input(s): "TSH", "T4TOTAL", "T3FREE", "THYROIDAB" in the last 72 hours.  Invalid input(s): "FREET3" Anemia work up: No results for input(s): "VITAMINB12", "FOLATE", "FERRITIN", "TIBC", "IRON", "RETICCTPCT" in the last 72 hours. Sepsis Labs: Recent Labs  Lab 12/18/22 0226 12/18/22 0256 12/18/22 0551 12/19/22 0904 12/20/22 1002 12/21/22 1813  WBC 11.2*  --   --  6.2 6.4 10.2  LATICACIDVEN  --  2.7* 1.7  --   --   --    Microbiology Recent Results (from the past 240 hour(s))  Resp panel by RT-PCR (RSV, Flu A&B, Covid) Anterior Nasal Swab     Status: None   Collection Time: 12/18/22  2:35 AM   Specimen: Anterior Nasal Swab  Result Value Ref Range Status   SARS Coronavirus 2 by RT PCR NEGATIVE NEGATIVE Final    Comment: (NOTE) SARS-CoV-2 target nucleic acids are NOT DETECTED.  The SARS-CoV-2 RNA is generally detectable in upper respiratory specimens during the acute phase of infection. The lowest concentration of SARS-CoV-2 viral copies this assay can detect is 138 copies/mL. A negative result does not preclude SARS-Cov-2 infection and should not be used as the sole basis for treatment or other patient management decisions. A negative result may occur with  improper specimen collection/handling, submission of specimen other than nasopharyngeal swab, presence of viral mutation(s) within the areas targeted by this assay, and inadequate number of viral copies(<138 copies/mL). A negative result must be combined with clinical observations, patient history, and epidemiological information. The expected result is Negative.  Fact Sheet for Patients:  BloggerCourse.com  Fact Sheet for Healthcare  Providers:  SeriousBroker.it  This test is no t yet approved or cleared by the [place] FDA and  has been authorized for detection and/or diagnosis of SARS-CoV-2 by FDA under an Emergency Use Authorization (EUA). This EUA will remain  in effect (meaning this test can be used) for the duration of the COVID-19 declaration under Section 564(b)(1) of the Act, 21  U.S.C.section 360bbb-3(b)(1), unless the authorization is terminated  or revoked sooner.       Influenza A by PCR NEGATIVE NEGATIVE Final   Influenza B by PCR NEGATIVE NEGATIVE Final    Comment: (NOTE) The Xpert Xpress SARS-CoV-2/FLU/RSV plus assay is intended as an aid in the diagnosis of influenza from Nasopharyngeal swab specimens and should not be used as a sole basis for treatment. Nasal washings and aspirates are unacceptable for Xpert Xpress SARS-CoV-2/FLU/RSV testing.  Fact Sheet for Patients: BloggerCourse.com  Fact Sheet for Healthcare Providers: SeriousBroker.it  This test is not yet approved or cleared by the [place] FDA and has been authorized for detection and/or diagnosis of SARS-CoV-2 by FDA under an Emergency Use Authorization (EUA). This EUA will remain in effect (meaning this test can be used) for the duration of the COVID-19 declaration under Section 564(b)(1) of the Act, 21 U.S.C. section 360bbb-3(b)(1), unless the authorization is terminated or revoked.     Resp Syncytial Virus by PCR NEGATIVE NEGATIVE Final    Comment: (NOTE) Fact Sheet for Patients: BloggerCourse.com  Fact Sheet for Healthcare Providers: SeriousBroker.it  This test is not yet approved or cleared by the [place] FDA and has been authorized for detection and/or diagnosis of SARS-CoV-2 by FDA under an Emergency Use Authorization (EUA). This EUA will remain in effect (meaning this test can be  used) for the duration of the COVID-19 declaration under Section 564(b)(1) of the Act, 21 U.S.C. section 360bbb-3(b)(1), unless the authorization is terminated or revoked.  Performed at [facility], [address] [place], [place] [address]   Blood Culture (routine x 2)     Status: Abnormal   Collection Time: 12/18/22  2:50 AM   Specimen: BLOOD  Result Value Ref Range Status   Specimen Description   Final    BLOOD RIGHT ANTECUBITAL Performed at [facility], [address], [place], [place] [address]    Special Requests   Final    Blood Culture adequate volume BOTTLES DRAWN AEROBIC AND ANAEROBIC Performed at [facility], [address], [place], [place] [address]    Culture  Setup Time   Final    GRAM POSITIVE COCCI ANAEROBIC BOTTLE ONLY CRITICAL RESULT CALLED TO, READ BACK BY AND VERIFIED WITH: PHARMD JUSTIN LEGGE [id number] AT 0829 BY EC    Culture (A)  Final    STAPHYLOCOCCUS EPIDERMIDIS THE SIGNIFICANCE OF ISOLATING THIS ORGANISM FROM A SINGLE SET OF BLOOD CULTURES WHEN MULTIPLE SETS ARE DRAWN IS UNCERTAIN. PLEASE NOTIFY THE MICROBIOLOGY DEPARTMENT WITHIN ONE WEEK IF SPECIATION AND SENSITIVITIES ARE REQUIRED. Performed at [facility], [address] [place], [place] [address]    Report Status 12/21/2022 FINAL  Final  Blood Culture ID Panel (Reflexed)     Status: Abnormal   Collection Time: 12/18/22  2:50 AM  Result Value Ref Range Status   Enterococcus faecalis NOT DETECTED NOT DETECTED Final   Enterococcus Faecium NOT DETECTED NOT DETECTED Final   Listeria monocytogenes NOT DETECTED NOT DETECTED Final   Staphylococcus species DETECTED (A) NOT DETECTED Final    Comment: CRITICAL RESULT CALLED TO, READ BACK BY AND VERIFIED WITH: PHARMD JUSTIN LEGGE [id number] AT 0829 BY EC    Staphylococcus aureus (BCID) NOT DETECTED NOT DETECTED Final   Staphylococcus epidermidis DETECTED (A) NOT DETECTED Final    Comment:  CRITICAL RESULT CALLED TO, READ BACK BY AND VERIFIED WITH: PHARMD JUSTIN LEGGE [id number] 0829 BY EC    Staphylococcus lugdunensis NOT  DETECTED NOT DETECTED Final   Streptococcus species NOT DETECTED NOT DETECTED Final   Streptococcus agalactiae NOT DETECTED NOT DETECTED Final   Streptococcus pneumoniae NOT DETECTED NOT DETECTED Final   Streptococcus pyogenes NOT DETECTED NOT DETECTED Final   A.calcoaceticus-baumannii NOT DETECTED NOT DETECTED Final   Bacteroides fragilis NOT DETECTED NOT DETECTED Final   Enterobacterales NOT DETECTED NOT DETECTED Final   Enterobacter cloacae complex NOT DETECTED NOT DETECTED Final   Escherichia coli NOT DETECTED NOT DETECTED Final   Klebsiella aerogenes NOT DETECTED NOT DETECTED Final   Klebsiella oxytoca NOT DETECTED NOT DETECTED Final   Klebsiella pneumoniae NOT DETECTED NOT DETECTED Final   Proteus species NOT DETECTED NOT DETECTED Final   Salmonella species NOT DETECTED NOT DETECTED Final   Serratia marcescens NOT DETECTED NOT DETECTED Final   Haemophilus influenzae NOT DETECTED NOT DETECTED Final   Neisseria meningitidis NOT DETECTED NOT DETECTED Final   Pseudomonas aeruginosa NOT DETECTED NOT DETECTED Final   Stenotrophomonas maltophilia NOT DETECTED NOT DETECTED Final   Candida albicans NOT DETECTED NOT DETECTED Final   Candida auris NOT DETECTED NOT DETECTED Final   Candida glabrata NOT DETECTED NOT DETECTED Final   Candida krusei NOT DETECTED NOT DETECTED Final   Candida parapsilosis NOT DETECTED NOT DETECTED Final   Candida tropicalis NOT DETECTED NOT DETECTED Final   Cryptococcus neoformans/gattii NOT DETECTED NOT DETECTED Final   Methicillin resistance mecA/C NOT DETECTED NOT DETECTED Final    Comment: Performed at [facility], [address], [place], [place] [address]  Blood Culture (routine x 2)     Status: None   Collection Time: 12/18/22  2:55 AM   Specimen: BLOOD  Result Value Ref Range Status   Specimen Description   Final     BLOOD LEFT ANTECUBITAL Performed at [facility], [address] [place], [place] [address]    Special Requests   Final    Blood Culture adequate volume BOTTLES DRAWN AEROBIC AND ANAEROBIC Performed at [facility], [address], [place], [place] [address]    Culture   Final    NO GROWTH 5 DAYS Performed at [facility], [address], [place], [place] [address]    Report Status 12/23/2022 FINAL  Final  Pleural fluid culture w Gram Stain     Status: None   Collection Time: 12/18/22  1:12 PM   Specimen: Pleural Fluid  Result Value Ref Range Status   Specimen Description   Final    PLEURAL Performed at [facility], [address], [place], [place] [address]    Special Requests   Final    Immunocompromised Performed at [facility], [address] [place], [place] [address]    Gram Stain   Final    FEW WBC PRESENT, PREDOMINANTLY MONONUCLEAR NO ORGANISMS SEEN    Culture   Final    NO GROWTH 3 DAYS Performed at [facility], [address] [place], [place] [address]    Report Status 12/21/2022 FINAL  Final  Acid Fast Smear (AFB)     Status: None   Collection Time: 12/18/22  1:12 PM   Specimen: Pleural Fluid  Result Value Ref Range Status   AFB Specimen Processing Concentration  Final   Acid Fast Smear Negative  Final    Comment: (NOTE) Performed At: [facility] [address] [place], [place] [id number] Jolene Schimke MD LK:[id number]    Source (AFB) PLEURAL  Final  Comment: Performed at [facility], [address], [place], [place] [address]  Blastomyces Antigen     Status: None   Collection Time: 12/19/22  9:04 AM  Result Value Ref Range Status   Blastomyces Antigen None Detected None Detected ng/mL Final    Comment: (NOTE) Reference Interval: None Detected Reportable Range: 0.31 ng/mL - 20.00 ng/mL Results above 20.00 ng/mL are reported  as 'Positive, Above the Limit of Quantification' This test was developed and its performance characteristics determined by [employer]. It has not been cleared or approved by the FDA; however, FDA clearance or approval is not currently required for clinical use. The results are not intended to be used as the sole means for clinical diagnosis or patient decisions.    Specimen Type SERUM  Final    Comment: (NOTE) Performed At: [facility] [address] [place], [place] [id number] Roxanne Gates MD OZ:[id number]   Culture, Respiratory w Gram Stain     Status: None (Preliminary result)   Collection Time: 12/21/22  2:05 PM   Specimen: Bronchial Alveolar Lavage; Respiratory  Result Value Ref Range Status   Specimen Description   Final    BRONCHIAL ALVEOLAR LAVAGE LEFT UPPER LOBE Performed at [facility], [address] [place], [place] [address]    Special Requests   Final    BRONCHIAL ALVEOLAR LAVAGE LEFT UPPER LOBE Performed at [facility], [address], [place], [place] [address]    Gram Stain   Final    RARE WBC PRESENT, PREDOMINANTLY MONONUCLEAR NO ORGANISMS SEEN    Culture   Final    NO GROWTH < 24 HOURS Performed at [facility], [address] [place], [place] [address]    Report Status PENDING  Incomplete  Culture, Respiratory w Gram Stain     Status: None (Preliminary result)   Collection Time: 12/21/22  2:05 PM   Specimen: Bronchial Alveolar Lavage; Respiratory  Result Value Ref Range Status   Specimen Description   Final    BRONCHIAL ALVEOLAR LAVAGE LEFT LOWER LOBE Performed at [facility], [address], [place], [place] [address]    Special Requests   Final    BRONCHIAL ALVEOLAR LAVAGE LEFT LOWER LOBE Performed at [facility], [address] [place], [place] [address]    Gram Stain NO WBC SEEN NO ORGANISMS SEEN   Final   Culture   Final    NO  GROWTH < 24 HOURS Performed at [facility], [address], [place], [place] [address]    Report Status PENDING  Incomplete  Acid Fast Smear (AFB)     Status: None   Collection Time: 12/21/22  2:05 PM   Specimen: Bronchial Alveolar Lavage; Respiratory  Result Value Ref Range Status   AFB Specimen Processing Concentration  Final   Acid Fast Smear Negative  Final    Comment: (NOTE) Performed At: [facility] [address] [place], [place] [id number] Jolene Schimke MD [id number]    Source (AFB) BRONCHIAL ALVEOLAR LAVAGE  Final    Comment: LEFT UPPER LOBE Performed at [facility], [address] [place], [place] [address]   Acid Fast Smear (AFB)     Status: None   Collection Time: 12/21/22  2:05 PM   Specimen: Bronchial Alveolar Lavage; Respiratory  Result Value Ref Range Status   AFB Specimen Processing Concentration  Final   Acid Fast Smear Negative  Final  Comment: (NOTE) Performed At: [facility] [address] [place], [place] [id number] Jolene Schimke MD WU:[id number]    Source (AFB) BRONCHIAL ALVEOLAR LAVAGE  Final    Comment:  LEFT LOWER LOBE Performed at [facility], [address], [place], [place] [address]   Anaerobic culture w Gram Stain     Status: None (Preliminary result)   Collection Time: 12/21/22  2:05 PM   Specimen: Bronchoalveolar Lavage  Result Value Ref Range Status   Specimen Description BRONCHIAL ALVEOLAR LAVAGE  Final   Special Requests LEFT LOWER LOBE  Final   Gram Stain   Final    WBC PRESENT, PREDOMINANTLY MONONUCLEAR NO ORGANISMS SEEN CYTOSPIN SMEAR Performed at [facility], [address], [place], [place] [address]    Culture PENDING  Incomplete   Report Status PENDING  Incomplete  Anaerobic culture w Gram Stain     Status: None (Preliminary result)   Collection Time: 12/21/22  2:05 PM   Specimen: Bronchoalveolar Lavage  Result Value Ref Range Status   Specimen  Description BRONCHIAL ALVEOLAR LAVAGE  Final   Special Requests LEFT UPPER LOBE  Final   Gram Stain   Final    WBC PRESENT, PREDOMINANTLY MONONUCLEAR NO ORGANISMS SEEN CYTOSPIN SMEAR Performed at [facility], [address] [place], [place] [address]    Culture PENDING  Incomplete   Report Status PENDING  Incomplete  CSF culture w Gram Stain     Status: None (Preliminary result)   Collection Time: 12/22/22 12:34 PM   Specimen: CSF; Cerebrospinal Fluid  Result Value Ref Range Status   Specimen Description   Final    CSF Performed at [facility], [address], [place], [place] [address]    Special Requests   Final    Normal Performed at [facility], [address] [place], [place] [address]    Gram Stain NO WBC SEEN NO ORGANISMS SEEN CYTOSPIN SMEAR   Final   Culture   Final    NO GROWTH < 24 HOURS Performed at [facility], [address] [place], [place] [address]    Report Status PENDING  Incomplete     Medications:    bictegravir-emtricitabine-tenofovir AF  1 tablet Oral Daily   enoxaparin (LOVENOX) injection  40 mg Subcutaneous Q24H   fluconazole  200 mg Oral Daily   Continuous Infusions:  ampicillin-sulbactam (UNASYN) IV 3 g (12/23/22 1047)      LOS: 5 days   Marinda Elk  [facility]  12/23/2022, 10:55 AM

## 2022-12-24 ENCOUNTER — Inpatient Hospital Stay (HOSPITAL_COMMUNITY): Payer: 59

## 2022-12-24 ENCOUNTER — Encounter (HOSPITAL_COMMUNITY): Payer: Self-pay | Admitting: Internal Medicine

## 2022-12-24 DIAGNOSIS — C3490 Malignant neoplasm of unspecified part of unspecified bronchus or lung: Secondary | ICD-10-CM

## 2022-12-24 DIAGNOSIS — C3492 Malignant neoplasm of unspecified part of left bronchus or lung: Secondary | ICD-10-CM

## 2022-12-24 DIAGNOSIS — J9859 Other diseases of mediastinum, not elsewhere classified: Secondary | ICD-10-CM | POA: Diagnosis not present

## 2022-12-24 HISTORY — DX: Malignant neoplasm of unspecified part of left bronchus or lung: C34.92

## 2022-12-24 LAB — CBC WITH DIFFERENTIAL/PLATELET
Abs Immature Granulocytes: 0.03 10*3/uL (ref 0.00–0.07)
Basophils Absolute: 0.1 10*3/uL (ref 0.0–0.1)
Basophils Relative: 1 %
Eosinophils Absolute: 0.1 10*3/uL (ref 0.0–0.5)
Eosinophils Relative: 1 %
HCT: 46.9 % (ref 39.0–52.0)
Hemoglobin: 16.5 g/dL (ref 13.0–17.0)
Immature Granulocytes: 1 %
Lymphocytes Relative: 32 %
Lymphs Abs: 2.1 10*3/uL (ref 0.7–4.0)
MCH: 31.7 pg (ref 26.0–34.0)
MCHC: 35.2 g/dL (ref 30.0–36.0)
MCV: 90.2 fL (ref 80.0–100.0)
Monocytes Absolute: 0.7 10*3/uL (ref 0.1–1.0)
Monocytes Relative: 10 %
Neutro Abs: 3.7 10*3/uL (ref 1.7–7.7)
Neutrophils Relative %: 55 %
Platelets: 222 10*3/uL (ref 150–400)
RBC: 5.2 MIL/uL (ref 4.22–5.81)
RDW: 13.5 % (ref 11.5–15.5)
WBC: 6.6 10*3/uL (ref 4.0–10.5)
nRBC: 0 % (ref 0.0–0.2)

## 2022-12-24 LAB — COMPREHENSIVE METABOLIC PANEL
ALT: 22 U/L (ref 0–44)
AST: 18 U/L (ref 15–41)
Albumin: 3.8 g/dL (ref 3.5–5.0)
Alkaline Phosphatase: 67 U/L (ref 38–126)
Anion gap: 10 (ref 5–15)
BUN: 12 mg/dL (ref 6–20)
CO2: 24 mmol/L (ref 22–32)
Calcium: 9 mg/dL (ref 8.9–10.3)
Chloride: 103 mmol/L (ref 98–111)
Creatinine, Ser: 1.15 mg/dL (ref 0.61–1.24)
GFR, Estimated: >60 mL/min (ref >60)
Glucose, Bld: 97 mg/dL (ref 70–99)
Potassium: 3.7 mmol/L (ref 3.5–5.1)
Sodium: 137 mmol/L (ref 135–145)
Total Bilirubin: 0.5 mg/dL (ref 0.3–1.2)
Total Protein: 7 g/dL (ref 6.5–8.1)

## 2022-12-24 LAB — LACTATE DEHYDROGENASE: LDH: 602 U/L — ABNORMAL HIGH (ref 98–192)

## 2022-12-24 MED ORDER — GADOBUTROL 1 MMOL/ML IV SOLN
7.0000 mL | Freq: Once | INTRAVENOUS | Status: AC | PRN
  Administered 2022-12-24: 7 mL via INTRAVENOUS

## 2022-12-24 MED ORDER — MORPHINE SULFATE (PF) 2 MG/ML IV SOLN
1.0000 mg | Freq: Once | INTRAVENOUS | Status: AC
  Administered 2022-12-24: 1 mg via INTRAVENOUS
  Filled 2022-12-24: qty 1

## 2022-12-24 MED ORDER — ENSURE ENLIVE PO LIQD
237.0000 mL | Freq: Two times a day (BID) | ORAL | Status: DC
  Administered 2022-12-25 – 2023-01-02 (×9): 237 mL via ORAL

## 2022-12-24 MED ORDER — SODIUM CHLORIDE 0.9 % IV SOLN
3.0000 g | Freq: Four times a day (QID) | INTRAVENOUS | Status: AC
  Administered 2022-12-24 – 2022-12-25 (×4): 3 g via INTRAVENOUS
  Filled 2022-12-24 (×4): qty 8

## 2022-12-24 MED ORDER — DRONABINOL 2.5 MG PO CAPS
5.0000 mg | ORAL_CAPSULE | Freq: Two times a day (BID) | ORAL | Status: DC
  Administered 2022-12-24 – 2023-01-02 (×18): 5 mg via ORAL
  Filled 2022-12-24 (×19): qty 2
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
## 2022-12-24 NOTE — Progress Notes (Addendum)
Patient ID: Christopher Henson, male   DOB: 12/21/1980, 42 y.o.   MRN: [id number] Aware of request for BM bx/port a cath placement on pt. Will plan to see pt on 9/16 and schedule accordingly.

## 2022-12-24 NOTE — Progress Notes (Signed)
Report called to [facility], RN on [facility]. Pt to transfer to 1614 soon for inpatient chemo tomorrow.

## 2022-12-24 NOTE — Progress Notes (Signed)
[facility] PROGRESS NOTE    Progress Note  Christopher Henson  IEP:[id number] DOB: [birth year] DOA: 12/18/2022 PCP: [name], PA-C     Brief Narrative:   Christopher Henson is an 42 y.o. 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, cryptococcal meningitis alcohol abuse comes in with chest pain shortness of breath and generalized weakness with unintentional weight loss, he relates has not been able to take his antiviral medication for about a week due to cost in the ED chest x-ray showed mediastinal mass he was recommended to have a CTA but left AGAINST MEDICAL ADVICE came back to the ED, CT angio showed no PE but showed an 8 x 7 anterior mediastinal mass in the left hilar region with mass affect on the left upper lobe pulmonary artery, left upper lobe pulmonary nodule 1.6 x 1.2 cm and small left increased size pleural effusion CT scan of the abdomen pelvis showed no evidence of metastatic disease in the abdomen and pelvis, CT of the head showed no acute findings.  Urinary was consulted and bronchoscopy scheduled for 12/21/2022. ID is on board, currently on IV Unasyn.  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 Status post thoracocentesis 800 cc culture negative to date exudative effusion.  Cytology of pleural fluid shows malignant cells, consistent with small cell lung cancer Pulmonary was consulted status post bronchoscopy on 12/21/2022 biopsy results are pending. Currently on IV Unasyn, new antibiotics for total 7 days. Oncology was consulted recommended the , Port-A-Cath placement patient will be placed on 12/25/2022, they recommended to move him to the 6 E, MRI of the brain and fluoro-guided bone marrow biopsy and probably get started on chemotherapy this week as an inpatient.  HIV: With a CD4 count of 258. Noncompliance with his medication. ID was consulted recommended IV Unasyn and  started on Biktarvy.  Serum positive cryptococcal antigen: ID recommended fluoroscopy guided lumbar puncture, opening pressure was 21 cm of water. CSF analysis showed Cryptococcal antigen negative, CSF glucose 76, protein of 27 2 white blood cells  Tobacco abuse: Nicotine patch was described.  Alcohol use: No signs of withdrawal continue thiamine and folate.  Noncompliance: Has been counseled.  Hypokalemia: Repleted now resolved.  Blood cultures positive : For Staph epidermidis 1 out of 4 likely contaminant.   DVT prophylaxis: none Family Communication:none Status is: Inpatient Remains inpatient appropriate because: Mediastinal mass    Code Status:     Code Status Orders  (From admission, onward)           Start     Ordered   12/18/22 0828  Full code  Continuous       Question:  By:  Answer:  Consent: discussion documented in EHR   12/18/22 0828           Code Status History     Date Active Date Inactive Code Status Order ID Comments User Context   06/08/2018 0855 06/21/2018 1833 Full Code [id number]  Rolly Salter, MD Inpatient         IV Access:   Peripheral IV   Procedures and diagnostic studies:   IR LUMBAR PUNCTURE  Result Date: 12/22/2022 CLINICAL DATA:  42 year old male with history of cryptococcal meningitis. EXAM: DIAGNOSTIC LUMBAR PUNCTURE UNDER FLUOROSCOPIC GUIDANCE COMPARISON:  06/17/2018 FLUOROSCOPY TIME:  Three mGy PROCEDURE: Informed consent was obtained from the patient prior to the procedure, including potential complications of headache, allergy, and pain. With  the patient prone, the lower back was prepped with Betadine. 1% Lidocaine was used for local anesthesia. Lumbar puncture was performed at the L2-L3 level using a 3.5 inch, 20 gauge needle with return of clear CSF with an opening pressure of 21 cm water. A total of 16 ml of CSF were obtained for laboratory studies. The patient tolerated the procedure well and there were no  apparent complications. IMPRESSION: Technically successful fluoroscopic guided lumbar puncture at L2-L3. Marliss Coots, MD [facility] Electronically Signed   By: Marliss Coots M.D.   On: 12/22/2022 15:42     Medical Consultants:   None.   Subjective:    Christopher Henson no complaint, just nervous need something for sleep.  Objective:    Vitals:   12/23/22 1935 12/23/22 2054 12/24/22 0119 12/24/22 0536  BP: (!) 143/92 (!) 134/101 (!) 135/90 (!) 144/102  Pulse: 84 88 92 77  Resp: 20 16 15 18   Temp: 98.7 F (37.1 C) 98.5 F (36.9 C) 98.7 F (37.1 C) 97.6 F (36.4 C)  TempSrc: Oral Oral Oral Oral  SpO2: 98% 98% 95% 97%  Weight:      Height:       SpO2: 97 % O2 Flow Rate (L/min): 2 L/min   Intake/Output Summary (Last 24 hours) at 12/24/2022 0924 Last data filed at 12/24/2022 0538 Gross per 24 hour  Intake 1580 ml  Output --  Net 1580 ml   Filed Weights   12/18/22 2115  Weight: 78.9 kg    Exam: General exam: In no acute distress. Respiratory system: Good air movement and clear to auscultation. Cardiovascular system: S1 & S2 heard, RRR. No JVD. Gastrointestinal system: Abdomen is nondistended, soft and nontender.  Extremities: No pedal edema. Skin: No rashes, lesions or ulcers Psychiatry: Judgement and insight appear normal. Mood & affect appropriate. Data Reviewed:    Labs: Basic Metabolic Panel: Recent Labs  Lab 12/18/22 0226 12/18/22 0251 12/19/22 0904 12/20/22 1002 12/21/22 1813  NA 136 139 138 139 138  K 3.1* 3.4* 4.1 3.9 3.8  CL 103 105 105 102 102  CO2 24  --  23 27 25   GLUCOSE 133* 123* 107* 96 193*  BUN 12 12 6 9 11   CREATININE 1.06 1.20 1.04 1.16 1.37*  CALCIUM 8.5*  --  8.5* 9.0 9.1  MG  --   --  2.2 2.3 2.4  PHOS  --   --  2.5 3.1 2.2*   GFR Estimated Creatinine Clearance: 75.5 mL/min (A) (by C-G formula based on SCr of 1.37 mg/dL (H)). Liver Function Tests: Recent Labs  Lab  12/18/22 0226 12/18/22 1312 12/19/22 0904 12/20/22 1002 12/21/22 1813  AST 24  --  21  --   --   ALT 19  --  19  --   --   ALKPHOS 77  --  74  --   --   BILITOT 1.0  --  1.0  --   --   PROT 6.9 6.7 6.6  --   --   ALBUMIN 3.7  --  3.5 4.0 4.2   No results for input(s): "LIPASE", "AMYLASE" in the last 168 hours. No results for input(s): "AMMONIA" in the last 168 hours. Coagulation profile Recent Labs  Lab 12/18/22 0226  INR 1.0   COVID-19 Labs  No results for input(s): "DDIMER", "FERRITIN", "LDH", "CRP" in the last 72 hours.   Lab Results  Component Value Date   SARSCOV2NAA NEGATIVE 12/18/2022  SARSCOV2NAA Not Detected 12/12/2019    CBC: Recent Labs  Lab 12/18/22 0226 12/18/22 0251 12/19/22 0904 12/20/22 1002 12/21/22 1813  WBC 11.2*  --  6.2 6.4 10.2  HGB 16.3 16.7 16.3 18.2* 17.2*  HCT 45.3 49.0 46.6 52.1* 49.0  MCV 89.7  --  90.3 91.2 90.7  PLT 220  --  209 220 226   Cardiac Enzymes: No results for input(s): "CKTOTAL", "CKMB", "CKMBINDEX", "TROPONINI" in the last 168 hours. BNP (last 3 results) No results for input(s): "PROBNP" in the last 8760 hours. CBG: No results for input(s): "GLUCAP" in the last 168 hours. D-Dimer: No results for input(s): "DDIMER" in the last 72 hours. Hgb A1c: No results for input(s): "HGBA1C" in the last 72 hours. Lipid Profile: No results for input(s): "CHOL", "HDL", "LDLCALC", "TRIG", "CHOLHDL", "LDLDIRECT" in the last 72 hours.  Thyroid function studies: No results for input(s): "TSH", "T4TOTAL", "T3FREE", "THYROIDAB" in the last 72 hours.  Invalid input(s): "FREET3" Anemia work up: No results for input(s): "VITAMINB12", "FOLATE", "FERRITIN", "TIBC", "IRON", "RETICCTPCT" in the last 72 hours. Sepsis Labs: Recent Labs  Lab 12/18/22 0226 12/18/22 0256 12/18/22 0551 12/19/22 0904 12/20/22 1002 12/21/22 1813  WBC 11.2*  --   --  6.2 6.4 10.2  LATICACIDVEN  --  2.7* 1.7  --   --   --    Microbiology Recent Results  (from the past 240 hour(s))  Resp panel by RT-PCR (RSV, Flu A&B, Covid) Anterior Nasal Swab     Status: None   Collection Time: 12/18/22  2:35 AM   Specimen: Anterior Nasal Swab  Result Value Ref Range Status   SARS Coronavirus 2 by RT PCR NEGATIVE NEGATIVE Final    Comment: (NOTE) SARS-CoV-2 target nucleic acids are NOT DETECTED.  The SARS-CoV-2 RNA is generally detectable in upper respiratory specimens during the acute phase of infection. The lowest concentration of SARS-CoV-2 viral copies this assay can detect is 138 copies/mL. A negative result does not preclude SARS-Cov-2 infection and should not be used as the sole basis for treatment or other patient management decisions. A negative result may occur with  improper specimen collection/handling, submission of specimen other than nasopharyngeal swab, presence of viral mutation(s) within the areas targeted by this assay, and inadequate number of viral copies(<138 copies/mL). A negative result must be combined with clinical observations, patient history, and epidemiological information. The expected result is Negative.  Fact Sheet for Patients:  BloggerCourse.com  Fact Sheet for Healthcare Providers:  SeriousBroker.it  This test is no t yet approved or cleared by the [place] FDA and  has been authorized for detection and/or diagnosis of SARS-CoV-2 by FDA under an Emergency Use Authorization (EUA). This EUA will remain  in effect (meaning this test can be used) for the duration of the COVID-19 declaration under Section 564(b)(1) of the Act, 21 U.S.C.section 360bbb-3(b)(1), unless the authorization is terminated  or revoked sooner.       Influenza A by PCR NEGATIVE NEGATIVE Final   Influenza B by PCR NEGATIVE NEGATIVE Final    Comment: (NOTE) The Xpert Xpress SARS-CoV-2/FLU/RSV plus assay is intended as an aid in the diagnosis of influenza from Nasopharyngeal swab  specimens and should not be used as a sole basis for treatment. Nasal washings and aspirates are unacceptable for Xpert Xpress SARS-CoV-2/FLU/RSV testing.  Fact Sheet for Patients: BloggerCourse.com  Fact Sheet for Healthcare Providers: SeriousBroker.it  This test is not yet approved or cleared by the [place] and has been authorized for  detection and/or diagnosis of SARS-CoV-2 by FDA under an Emergency Use Authorization (EUA). This EUA will remain in effect (meaning this test can be used) for the duration of the COVID-19 declaration under Section 564(b)(1) of the Act, 21 U.S.C. section 360bbb-3(b)(1), unless the authorization is terminated or revoked.     Resp Syncytial Virus by PCR NEGATIVE NEGATIVE Final    Comment: (NOTE) Fact Sheet for Patients: BloggerCourse.com  Fact Sheet for Healthcare Providers: SeriousBroker.it  This test is not yet approved or cleared by the [place] FDA and has been authorized for detection and/or diagnosis of SARS-CoV-2 by FDA under an Emergency Use Authorization (EUA). This EUA will remain in effect (meaning this test can be used) for the duration of the COVID-19 declaration under Section 564(b)(1) of the Act, 21 U.S.C. section 360bbb-3(b)(1), unless the authorization is terminated or revoked.  Performed at [facility], [address], [place], [place] [address]   Blood Culture (routine x 2)     Status: Abnormal   Collection Time: 12/18/22  2:50 AM   Specimen: BLOOD  Result Value Ref Range Status   Specimen Description   Final    BLOOD RIGHT ANTECUBITAL Performed at [facility], [address] [place], [place] [address]    Special Requests   Final    Blood Culture adequate volume BOTTLES DRAWN AEROBIC AND ANAEROBIC Performed at [facility], [address]  [place], [place] [address]    Culture  Setup Time   Final    GRAM POSITIVE COCCI ANAEROBIC BOTTLE ONLY CRITICAL RESULT CALLED TO, READ BACK BY AND VERIFIED WITH: PHARMD JUSTIN LEGGE [id number] AT 0829 BY EC    Culture (A)  Final    STAPHYLOCOCCUS EPIDERMIDIS THE SIGNIFICANCE OF ISOLATING THIS ORGANISM FROM A SINGLE SET OF BLOOD CULTURES WHEN MULTIPLE SETS ARE DRAWN IS UNCERTAIN. PLEASE NOTIFY THE MICROBIOLOGY DEPARTMENT WITHIN ONE WEEK IF SPECIATION AND SENSITIVITIES ARE REQUIRED. Performed at [facility], [address], [place], [place] [address]    Report Status 12/21/2022 FINAL  Final  Blood Culture ID Panel (Reflexed)     Status: Abnormal   Collection Time: 12/18/22  2:50 AM  Result Value Ref Range Status   Enterococcus faecalis NOT DETECTED NOT DETECTED Final   Enterococcus Faecium NOT DETECTED NOT DETECTED Final   Listeria monocytogenes NOT DETECTED NOT DETECTED Final   Staphylococcus species DETECTED (A) NOT DETECTED Final    Comment: CRITICAL RESULT CALLED TO, READ BACK BY AND VERIFIED WITH: PHARMD JUSTIN LEGGE [id number] AT 0829 BY EC    Staphylococcus aureus (BCID) NOT DETECTED NOT DETECTED Final   Staphylococcus epidermidis DETECTED (A) NOT DETECTED Final    Comment: CRITICAL RESULT CALLED TO, READ BACK BY AND VERIFIED WITH: PHARMD JUSTIN LEGGE [id number] 0829 BY EC    Staphylococcus lugdunensis NOT DETECTED NOT DETECTED Final   Streptococcus species NOT DETECTED NOT DETECTED Final   Streptococcus agalactiae NOT DETECTED NOT DETECTED Final   Streptococcus pneumoniae NOT DETECTED NOT DETECTED Final   Streptococcus pyogenes NOT DETECTED NOT DETECTED Final   A.calcoaceticus-baumannii NOT DETECTED NOT DETECTED Final   Bacteroides fragilis NOT DETECTED NOT DETECTED Final   Enterobacterales NOT DETECTED NOT DETECTED Final   Enterobacter cloacae complex NOT DETECTED NOT DETECTED Final   Escherichia coli NOT DETECTED NOT DETECTED Final   Klebsiella aerogenes NOT DETECTED NOT  DETECTED Final   Klebsiella oxytoca NOT DETECTED NOT DETECTED Final   Klebsiella pneumoniae NOT DETECTED NOT DETECTED Final  Proteus species NOT DETECTED NOT DETECTED Final   Salmonella species NOT DETECTED NOT DETECTED Final   Serratia marcescens NOT DETECTED NOT DETECTED Final   Haemophilus influenzae NOT DETECTED NOT DETECTED Final   Neisseria meningitidis NOT DETECTED NOT DETECTED Final   Pseudomonas aeruginosa NOT DETECTED NOT DETECTED Final   Stenotrophomonas maltophilia NOT DETECTED NOT DETECTED Final   Candida albicans NOT DETECTED NOT DETECTED Final   Candida auris NOT DETECTED NOT DETECTED Final   Candida glabrata NOT DETECTED NOT DETECTED Final   Candida krusei NOT DETECTED NOT DETECTED Final   Candida parapsilosis NOT DETECTED NOT DETECTED Final   Candida tropicalis NOT DETECTED NOT DETECTED Final   Cryptococcus neoformans/gattii NOT DETECTED NOT DETECTED Final   Methicillin resistance mecA/C NOT DETECTED NOT DETECTED Final    Comment: Performed at [facility], [address] [place], [place] [address]  Blood Culture (routine x 2)     Status: None   Collection Time: 12/18/22  2:55 AM   Specimen: BLOOD  Result Value Ref Range Status   Specimen Description   Final    BLOOD LEFT ANTECUBITAL Performed at [facility], [address], [place], [place] [address]    Special Requests   Final    Blood Culture adequate volume BOTTLES DRAWN AEROBIC AND ANAEROBIC Performed at [facility], [address], [place], [place] [address]    Culture   Final    NO GROWTH 5 DAYS Performed at [facility], [address] [place], [place] [address]    Report Status 12/23/2022 FINAL  Final  Pleural fluid culture w Gram Stain     Status: None   Collection Time: 12/18/22  1:12 PM   Specimen: Pleural Fluid  Result Value Ref Range Status   Specimen Description   Final    PLEURAL Performed at [facility], [address], [place], [place] [address]    Special Requests   Final    Immunocompromised Performed at [facility], [address], [place], [place] [address]    Gram Stain   Final    FEW WBC PRESENT, PREDOMINANTLY MONONUCLEAR NO ORGANISMS SEEN    Culture   Final    NO GROWTH 3 DAYS Performed at [facility], [address] [place] [address], [place], [place] [address]    Report Status 12/21/2022 FINAL  Final  Acid Fast Smear (AFB)     Status: None   Collection Time: 12/18/22  1:12 PM   Specimen: Pleural Fluid  Result Value Ref Range Status   AFB Specimen Processing Concentration  Final   Acid Fast Smear Negative  Final    Comment: (NOTE) Performed At: [facility] [address] [place], [place] [id number] Jolene Schimke MD LK:[id number]    Source (AFB) PLEURAL  Final    Comment: Performed at [facility], [address], [place], [place] [address]  Blastomyces Antigen     Status: None   Collection Time: 12/19/22  9:04 AM  Result Value Ref Range Status   Blastomyces Antigen None Detected None Detected ng/mL Final    Comment: (NOTE) Reference Interval: None Detected Reportable Range: 0.31 ng/mL - 20.00 ng/mL Results above 20.00 ng/mL are reported as 'Positive, Above the Limit of Quantification' This test was developed and its performance characteristics determined by [employer]. It has not been cleared or approved by the FDA; however, FDA clearance or approval is not currently required for clinical use.  The results are not intended to be used as the sole means for clinical diagnosis or patient decisions.    Specimen Type SERUM  Final    Comment: (NOTE) Performed At: [facility] [address] [place], [place] [id number] Roxanne Gates MD NF:[id number]   Culture, Respiratory w Gram Stain     Status: None (Preliminary result)   Collection Time: 12/21/22  2:05 PM   Specimen: Bronchial Alveolar Lavage; Respiratory   Result Value Ref Range Status   Specimen Description   Final    BRONCHIAL ALVEOLAR LAVAGE LEFT UPPER LOBE Performed at [facility], [address], [place], [place] [address]    Special Requests   Final    BRONCHIAL ALVEOLAR LAVAGE LEFT UPPER LOBE Performed at [facility], [address] [place], [place] [address]    Gram Stain   Final    RARE WBC PRESENT, PREDOMINANTLY MONONUCLEAR NO ORGANISMS SEEN    Culture   Final    NO GROWTH 2 DAYS Performed at [facility], [address], [place], [place] [address]    Report Status PENDING  Incomplete  Culture, Respiratory w Gram Stain     Status: None (Preliminary result)   Collection Time: 12/21/22  2:05 PM   Specimen: Bronchial Alveolar Lavage; Respiratory  Result Value Ref Range Status   Specimen Description   Final    BRONCHIAL ALVEOLAR LAVAGE LEFT LOWER LOBE Performed at [facility], [address] [place], [place] [address]    Special Requests   Final    BRONCHIAL ALVEOLAR LAVAGE LEFT LOWER LOBE Performed at [facility], [address] [place], [place] [address]    Gram Stain NO WBC SEEN NO ORGANISMS SEEN   Final   Culture   Final    NO GROWTH 2 DAYS Performed at [facility], [address] [place], [place] [address]    Report Status PENDING  Incomplete  Acid Fast Smear (AFB)     Status: None   Collection Time: 12/21/22  2:05 PM   Specimen: Bronchial Alveolar Lavage; Respiratory  Result Value Ref Range Status   AFB Specimen Processing Concentration  Final   Acid Fast Smear Negative  Final    Comment: (NOTE) Performed At: [facility] [address] [place], [place] [id number] Jolene Schimke MD VF:[id number]    Source (AFB) BRONCHIAL ALVEOLAR LAVAGE  Final    Comment: LEFT UPPER LOBE Performed at [facility], [address], [place], [place] [address]   Acid Fast Smear (AFB)     Status: None    Collection Time: 12/21/22  2:05 PM   Specimen: Bronchial Alveolar Lavage; Respiratory  Result Value Ref Range Status   AFB Specimen Processing Concentration  Final   Acid Fast Smear Negative  Final    Comment: (NOTE) Performed At: [facility] [address] [place], [place] [id number] Jolene Schimke MD NA:[id number]    Source (AFB) BRONCHIAL ALVEOLAR LAVAGE  Final    Comment:  LEFT LOWER LOBE Performed at [facility], [address] [place], [place] [address]   Anaerobic culture w Gram Stain     Status: None (Preliminary result)   Collection Time: 12/21/22  2:05 PM   Specimen: Bronchoalveolar Lavage  Result Value Ref Range Status   Specimen Description BRONCHIAL ALVEOLAR LAVAGE  Final   Special Requests LEFT LOWER LOBE  Final   Gram Stain   Final  WBC PRESENT, PREDOMINANTLY MONONUCLEAR NO ORGANISMS SEEN CYTOSPIN SMEAR Performed at [facility], [address], [place], [place] [address]    Culture   Final    NO ANAEROBES ISOLATED; CULTURE IN PROGRESS FOR 5 DAYS   Report Status PENDING  Incomplete  Anaerobic culture w Gram Stain     Status: None (Preliminary result)   Collection Time: 12/21/22  2:05 PM   Specimen: Bronchoalveolar Lavage  Result Value Ref Range Status   Specimen Description BRONCHIAL ALVEOLAR LAVAGE  Final   Special Requests LEFT UPPER LOBE  Final   Gram Stain   Final    WBC PRESENT, PREDOMINANTLY MONONUCLEAR NO ORGANISMS SEEN CYTOSPIN SMEAR Performed at [facility], [address] [place], [place] [address]    Culture   Final    NO ANAEROBES ISOLATED; CULTURE IN PROGRESS FOR 5 DAYS   Report Status PENDING  Incomplete  CSF culture w Gram Stain     Status: None (Preliminary result)   Collection Time: 12/22/22 12:34 PM   Specimen: CSF; Cerebrospinal Fluid  Result Value Ref Range Status   Specimen Description   Final    CSF Performed at [facility], [address] [place], [place]  [address]    Special Requests   Final    Normal Performed at [facility], [address], [place], [place] [address]    Gram Stain NO WBC SEEN NO ORGANISMS SEEN CYTOSPIN SMEAR   Final   Culture   Final    NO GROWTH 2 DAYS Performed at [facility], [address], [place], [place] [address]    Report Status PENDING  Incomplete     Medications:    bictegravir-emtricitabine-tenofovir AF  1 tablet Oral Daily   dronabinol  5 mg Oral BID AC   enoxaparin (LOVENOX) injection  40 mg Subcutaneous Q24H   fluconazole  200 mg Oral Daily   Continuous Infusions:  ampicillin-sulbactam (UNASYN) IV 3 g (12/24/22 0622)      LOS: 6 days   Marinda Elk  [facility]  12/24/2022, 9:24 AM

## 2022-12-24 NOTE — Progress Notes (Signed)
Patient c/o severe chest pain at this time. Vitals stable, EKG wnl. NP notified. Morphine received. Patient reports he had the exact same pain coming into the emergency department. Pain is in the middle of chest as before. Will continue to monitor patient closely overnight. Priscille Kluver

## 2022-12-25 ENCOUNTER — Encounter (HOSPITAL_COMMUNITY): Payer: Self-pay | Admitting: Internal Medicine

## 2022-12-25 ENCOUNTER — Encounter (HOSPITAL_COMMUNITY): Payer: Self-pay | Admitting: Hematology & Oncology

## 2022-12-25 ENCOUNTER — Inpatient Hospital Stay (HOSPITAL_COMMUNITY): Payer: 59

## 2022-12-25 DIAGNOSIS — C3492 Malignant neoplasm of unspecified part of left bronchus or lung: Secondary | ICD-10-CM | POA: Diagnosis not present

## 2022-12-25 DIAGNOSIS — J9859 Other diseases of mediastinum, not elsewhere classified: Secondary | ICD-10-CM | POA: Diagnosis not present

## 2022-12-25 DIAGNOSIS — B451 Cerebral cryptococcosis: Secondary | ICD-10-CM | POA: Diagnosis not present

## 2022-12-25 DIAGNOSIS — J90 Pleural effusion, not elsewhere classified: Secondary | ICD-10-CM | POA: Diagnosis not present

## 2022-12-25 DIAGNOSIS — B20 Human immunodeficiency virus [HIV] disease: Secondary | ICD-10-CM | POA: Diagnosis not present

## 2022-12-25 LAB — CULTURE, RESPIRATORY W GRAM STAIN
Culture: NO GROWTH
Culture: NO GROWTH
Gram Stain: NONE SEEN

## 2022-12-25 LAB — CBC WITH DIFFERENTIAL/PLATELET
Abs Immature Granulocytes: 0.03 10*3/uL (ref 0.00–0.07)
Basophils Absolute: 0.1 10*3/uL (ref 0.0–0.1)
Basophils Relative: 1 %
Eosinophils Absolute: 0.1 10*3/uL (ref 0.0–0.5)
Eosinophils Relative: 1 %
HCT: 47.9 % (ref 39.0–52.0)
Hemoglobin: 16.7 g/dL (ref 13.0–17.0)
Immature Granulocytes: 0 %
Lymphocytes Relative: 33 %
Lymphs Abs: 2.3 10*3/uL (ref 0.7–4.0)
MCH: 32.1 pg (ref 26.0–34.0)
MCHC: 34.9 g/dL (ref 30.0–36.0)
MCV: 92.1 fL (ref 80.0–100.0)
Monocytes Absolute: 0.7 10*3/uL (ref 0.1–1.0)
Monocytes Relative: 10 %
Neutro Abs: 3.8 10*3/uL (ref 1.7–7.7)
Neutrophils Relative %: 55 %
Platelets: 236 10*3/uL (ref 150–400)
RBC: 5.2 MIL/uL (ref 4.22–5.81)
RDW: 13.5 % (ref 11.5–15.5)
WBC: 7 10*3/uL (ref 4.0–10.5)
nRBC: 0 % (ref 0.0–0.2)

## 2022-12-25 LAB — CYTOLOGY - NON PAP

## 2022-12-25 LAB — CSF CULTURE W GRAM STAIN
Culture: NO GROWTH
Gram Stain: NONE SEEN
Special Requests: NORMAL

## 2022-12-25 MED ORDER — NICOTINE 7 MG/24HR TD PT24: 7.0000 mg | MEDICATED_PATCH | Freq: Every day | TRANSDERMAL | Status: AC

## 2022-12-25 MED ORDER — HYDROCODONE-ACETAMINOPHEN 10-325 MG PO TABS
1.0000 | ORAL_TABLET | Freq: Four times a day (QID) | ORAL | Status: DC | PRN
  Administered 2022-12-25 – 2022-12-26 (×2): 1 via ORAL
  Filled 2022-12-25 (×2): qty 1

## 2022-12-25 MED ORDER — SULFAMETHOXAZOLE-TRIMETHOPRIM 800-160 MG PO TABS
1.0000 | ORAL_TABLET | Freq: Every day | ORAL | Status: DC
  Administered 2022-12-25 – 2023-01-03 (×10): 1 via ORAL
  Filled 2022-12-25 (×10): qty 1

## 2022-12-25 NOTE — Progress Notes (Signed)
[facility] PROGRESS NOTE    Progress Note  Jamesedward Neels  QMV:[id number] DOB: 1981-02-18 DOA: 12/18/2022 PCP: [name], PA-C     Brief Narrative:   Pason Bradney is an 42 y.o. 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, cryptococcal meningitis alcohol abuse comes in with chest pain shortness of breath and generalized weakness with unintentional weight loss, he relates has not been able to take his antiviral medication for about a week due to cost in the ED chest x-ray showed mediastinal mass he was recommended to have a CTA but left AGAINST MEDICAL ADVICE came back to the ED, CT angio showed no PE but showed an 8 x 7 anterior mediastinal mass in the left hilar region with mass affect on the left upper lobe pulmonary artery, left upper lobe pulmonary nodule 1.6 x 1.2 cm and small left increased size pleural effusion CT scan of the abdomen pelvis showed no evidence of metastatic disease in the abdomen and pelvis, CT of the head showed no acute findings.  Urinary was consulted and bronchoscopy scheduled for 12/21/2022. ID is on board, currently on IV Unasyn.  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 Status post thoracocentesis 800 cc   Cytology of pleural fluid shows malignant cells, consistent with small cell lung cancer Pulmonary was consulted status post bronchoscopy on 12/21/2022 biopsy results are pending. Currently on IV Unasyn, new antibiotics for total 7 days. Oncology was consulted recommended the , Port-A-Cath placement patient will be placed on 12/25/2022. MRI of the brain no metastatic disease, Unusual white matter changes most likely do to HIV. Fluoro-guided bone marrow biopsy and probably get started on chemotherapy this week as an inpatient.  HIV: With a CD4 count of 258. Noncompliance with his medication. ID was consulted recommended to [facility]. He was also  started diflucan for 7 days. Has completed Unasyn treatment.  Serum positive cryptococcal antigen: ID recommended fluoroscopy guided lumbar puncture, opening pressure was 21 cm of water. CSF analysis showed Cryptococcal antigen negative, CSF glucose 76, protein of 27 2 white blood cells  Tobacco abuse: Nicotine patch was described.  Alcohol use: No signs of withdrawal continue thiamine and folate.  Noncompliance: Has been counseled.  Hypokalemia: Repleted now resolved.  Blood cultures positive : For Staph epidermidis 1 out of 4 likely contaminant.   DVT prophylaxis: none Family Communication:none Status is: Inpatient Remains inpatient appropriate because: Mediastinal mass    Code Status:     Code Status Orders  (From admission, onward)           Start     Ordered   12/18/22 0828  Full code  Continuous       Question:  By:  Answer:  Consent: discussion documented in EHR   12/18/22 0828           Code Status History     Date Active Date Inactive Code Status Order ID Comments User Context   06/08/2018 0855 06/21/2018 1833 Full Code [id number]  Rolly Salter, MD Inpatient         IV Access:   Peripheral IV   Procedures and diagnostic studies:   MR BRAIN W WO CONTRAST  Result Date: 12/24/2022 CLINICAL DATA:  Non-small cell lung cancer staging EXAM: MRI HEAD WITHOUT AND WITH CONTRAST TECHNIQUE: Multiplanar, multiecho pulse sequences of the brain and surrounding structures were obtained without and with intravenous contrast. CONTRAST:  7mL GADAVIST GADOBUTROL 1  MMOL/ML IV SOLN COMPARISON:  Head CT from 4 days ago FINDINGS: Brain: No enhancement or swelling to suggest metastatic disease. FLAIR hyperintensity in the cerebral white matter especially affecting the frontal lobes and the left centrum semiovale, without involvement of subcortical U fibers, mass effect, or enhancement. No acute or subacute infarct. No hemorrhage, hydrocephalus, or mass. Vascular:  Major flow voids are preserved Skull and upper cervical spine: Normal marrow signal Sinuses/Orbits: Negative IMPRESSION: 1. No enhancement to suggest metastatic disease. 2. 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 Electronically Signed   By: Tiburcio Pea M.D.   On: 12/24/2022 11:45     Medical Consultants:   None.   Subjective:    [name] no complaint, he is hungry  Objective:    Vitals:   12/24/22 1438 12/24/22 2036 12/24/22 2335 12/25/22 0505  BP: (!) 134/98 (!) 154/96 (!) 136/103 (!) 137/98  Pulse: 79 95 70 77  Resp: 20 18 18 18   Temp: 98.3 F (36.8 C) 98.6 F (37 C) 98.2 F (36.8 C) 98.4 F (36.9 C)  TempSrc: Oral Oral Oral Oral  SpO2: 97% 97% 100% 97%  Weight:      Height:       SpO2: 97 % O2 Flow Rate (L/min): 2 L/min   Intake/Output Summary (Last 24 hours) at 12/25/2022 1048 Last data filed at 12/25/2022 0645 Gross per 24 hour  Intake 1117 ml  Output --  Net 1117 ml   Filed Weights   12/18/22 2115  Weight: 78.9 kg    Exam: General exam: In no acute distress. Respiratory system: Good air movement and clear to auscultation. Cardiovascular system: S1 & S2 heard, RRR. No JVD. Gastrointestinal system: Abdomen is nondistended, soft and nontender.  Extremities: No pedal edema. Skin: No rashes, lesions or ulcers Psychiatry: Judgement and insight appear normal. Mood & affect appropriate. Data Reviewed:    Labs: Basic Metabolic Panel: Recent Labs  Lab 12/19/22 0904 12/20/22 1002 12/21/22 1813 12/24/22 0944  NA 138 139 138 137  K 4.1 3.9 3.8 3.7  CL 105 102 102 103  CO2 23 27 25 24   GLUCOSE 107* 96 193* 97  BUN 6 9 11 12   CREATININE 1.04 1.16 1.37* 1.15  CALCIUM 8.5* 9.0 9.1 9.0  MG 2.2 2.3 2.4  --   PHOS 2.5 3.1 2.2*  --    GFR Estimated Creatinine Clearance: 90 mL/min (by C-G formula based on SCr of 1.15 mg/dL). Liver Function Tests: Recent Labs  Lab 12/18/22 1312 12/19/22 0904  12/20/22 1002 12/21/22 1813 12/24/22 0944  AST  --  21  --   --  18  ALT  --  19  --   --  22  ALKPHOS  --  74  --   --  67  BILITOT  --  1.0  --   --  0.5  PROT 6.7 6.6  --   --  7.0  ALBUMIN  --  3.5 4.0 4.2 3.8   No results for input(s): "LIPASE", "AMYLASE" in the last 168 hours. No results for input(s): "AMMONIA" in the last 168 hours. Coagulation profile No results for input(s): "INR", "PROTIME" in the last 168 hours.  COVID-19 Labs  Recent Labs    12/24/22 0944  LDH 602*     Lab Results  Component Value Date   SARSCOV2NAA NEGATIVE 12/18/2022   SARSCOV2NAA Not Detected 12/12/2019    CBC: Recent Labs  Lab 12/19/22 0904 12/20/22 1002 12/21/22  1813 12/24/22 0944 12/25/22 0542  WBC 6.2 6.4 10.2 6.6 7.0  NEUTROABS  --   --   --  3.7 3.8  HGB 16.3 18.2* 17.2* 16.5 16.7  HCT 46.6 52.1* 49.0 46.9 47.9  MCV 90.3 91.2 90.7 90.2 92.1  PLT 209 220 226 222 236   Cardiac Enzymes: No results for input(s): "CKTOTAL", "CKMB", "CKMBINDEX", "TROPONINI" in the last 168 hours. BNP (last 3 results) No results for input(s): "PROBNP" in the last 8760 hours. CBG: No results for input(s): "GLUCAP" in the last 168 hours. D-Dimer: No results for input(s): "DDIMER" in the last 72 hours. Hgb A1c: No results for input(s): "HGBA1C" in the last 72 hours. Lipid Profile: No results for input(s): "CHOL", "HDL", "LDLCALC", "TRIG", "CHOLHDL", "LDLDIRECT" in the last 72 hours.  Thyroid function studies: No results for input(s): "TSH", "T4TOTAL", "T3FREE", "THYROIDAB" in the last 72 hours.  Invalid input(s): "FREET3" Anemia work up: No results for input(s): "VITAMINB12", "FOLATE", "FERRITIN", "TIBC", "IRON", "RETICCTPCT" in the last 72 hours. Sepsis Labs: Recent Labs  Lab 12/20/22 1002 12/21/22 1813 12/24/22 0944 12/25/22 0542  WBC 6.4 10.2 6.6 7.0   Microbiology Recent Results (from the past 240 hour(s))  Resp panel by RT-PCR (RSV, Flu A&B, Covid) Anterior Nasal Swab      Status: None   Collection Time: 12/18/22  2:35 AM   Specimen: Anterior Nasal Swab  Result Value Ref Range Status   SARS Coronavirus 2 by RT PCR NEGATIVE NEGATIVE Final    Comment: (NOTE) SARS-CoV-2 target nucleic acids are NOT DETECTED.  The SARS-CoV-2 RNA is generally detectable in upper respiratory specimens during the acute phase of infection. The lowest concentration of SARS-CoV-2 viral copies this assay can detect is 138 copies/mL. A negative result does not preclude SARS-Cov-2 infection and should not be used as the sole basis for treatment or other patient management decisions. A negative result may occur with  improper specimen collection/handling, submission of specimen other than nasopharyngeal swab, presence of viral mutation(s) within the areas targeted by this assay, and inadequate number of viral copies(<138 copies/mL). A negative result must be combined with clinical observations, patient history, and epidemiological information. The expected result is Negative.  Fact Sheet for Patients:  BloggerCourse.com  Fact Sheet for Healthcare Providers:  SeriousBroker.it  This test is no t yet approved or cleared by the [place] FDA and  has been authorized for detection and/or diagnosis of SARS-CoV-2 by FDA under an Emergency Use Authorization (EUA). This EUA will remain  in effect (meaning this test can be used) for the duration of the COVID-19 declaration under Section 564(b)(1) of the Act, 21 U.S.C.section 360bbb-3(b)(1), unless the authorization is terminated  or revoked sooner.       Influenza A by PCR NEGATIVE NEGATIVE Final   Influenza B by PCR NEGATIVE NEGATIVE Final    Comment: (NOTE) The Xpert Xpress SARS-CoV-2/FLU/RSV plus assay is intended as an aid in the diagnosis of influenza from Nasopharyngeal swab specimens and should not be used as a sole basis for treatment. Nasal washings and aspirates are  unacceptable for Xpert Xpress SARS-CoV-2/FLU/RSV testing.  Fact Sheet for Patients: BloggerCourse.com  Fact Sheet for Healthcare Providers: SeriousBroker.it  This test is not yet approved or cleared by the [place] FDA and has been authorized for detection and/or diagnosis of SARS-CoV-2 by FDA under an Emergency Use Authorization (EUA). This EUA will remain in effect (meaning this test can be used) for the duration of the COVID-19 declaration under Section 564(b)(1) of  the Act, 21 U.S.C. section 360bbb-3(b)(1), unless the authorization is terminated or revoked.     Resp Syncytial Virus by PCR NEGATIVE NEGATIVE Final    Comment: (NOTE) Fact Sheet for Patients: BloggerCourse.com  Fact Sheet for Healthcare Providers: SeriousBroker.it  This test is not yet approved or cleared by the [place] FDA and has been authorized for detection and/or diagnosis of SARS-CoV-2 by FDA under an Emergency Use Authorization (EUA). This EUA will remain in effect (meaning this test can be used) for the duration of the COVID-19 declaration under Section 564(b)(1) of the Act, 21 U.S.C. section 360bbb-3(b)(1), unless the authorization is terminated or revoked.  Performed at [facility], [address] [place], [place] [address]   Blood Culture (routine x 2)     Status: Abnormal   Collection Time: 12/18/22  2:50 AM   Specimen: BLOOD  Result Value Ref Range Status   Specimen Description   Final    BLOOD RIGHT ANTECUBITAL Performed at [facility], [address] [place], [place] [address]    Special Requests   Final    Blood Culture adequate volume BOTTLES DRAWN AEROBIC AND ANAEROBIC Performed at [facility], [address], [place], [place] [address]    Culture  Setup Time   Final    GRAM POSITIVE COCCI ANAEROBIC BOTTLE  ONLY CRITICAL RESULT CALLED TO, READ BACK BY AND VERIFIED WITH: PHARMD JUSTIN LEGGE [id number] AT 0829 BY EC    Culture (A)  Final    STAPHYLOCOCCUS EPIDERMIDIS THE SIGNIFICANCE OF ISOLATING THIS ORGANISM FROM A SINGLE SET OF BLOOD CULTURES WHEN MULTIPLE SETS ARE DRAWN IS UNCERTAIN. PLEASE NOTIFY THE MICROBIOLOGY DEPARTMENT WITHIN ONE WEEK IF SPECIATION AND SENSITIVITIES ARE REQUIRED. Performed at [facility], [address], [place], [place] [address]    Report Status 12/21/2022 FINAL  Final  Blood Culture ID Panel (Reflexed)     Status: Abnormal   Collection Time: 12/18/22  2:50 AM  Result Value Ref Range Status   Enterococcus faecalis NOT DETECTED NOT DETECTED Final   Enterococcus Faecium NOT DETECTED NOT DETECTED Final   Listeria monocytogenes NOT DETECTED NOT DETECTED Final   Staphylococcus species DETECTED (A) NOT DETECTED Final    Comment: CRITICAL RESULT CALLED TO, READ BACK BY AND VERIFIED WITH: PHARMD JUSTIN LEGGE [id number] AT 0829 BY EC    Staphylococcus aureus (BCID) NOT DETECTED NOT DETECTED Final   Staphylococcus epidermidis DETECTED (A) NOT DETECTED Final    Comment: CRITICAL RESULT CALLED TO, READ BACK BY AND VERIFIED WITH: PHARMD JUSTIN LEGGE [id number] 0829 BY EC    Staphylococcus lugdunensis NOT DETECTED NOT DETECTED Final   Streptococcus species NOT DETECTED NOT DETECTED Final   Streptococcus agalactiae NOT DETECTED NOT DETECTED Final   Streptococcus pneumoniae NOT DETECTED NOT DETECTED Final   Streptococcus pyogenes NOT DETECTED NOT DETECTED Final   A.calcoaceticus-baumannii NOT DETECTED NOT DETECTED Final   Bacteroides fragilis NOT DETECTED NOT DETECTED Final   Enterobacterales NOT DETECTED NOT DETECTED Final   Enterobacter cloacae complex NOT DETECTED NOT DETECTED Final   Escherichia coli NOT DETECTED NOT DETECTED Final   Klebsiella aerogenes NOT DETECTED NOT DETECTED Final   Klebsiella oxytoca NOT DETECTED NOT DETECTED Final   Klebsiella pneumoniae NOT  DETECTED NOT DETECTED Final   Proteus species NOT DETECTED NOT DETECTED Final   Salmonella species NOT DETECTED NOT DETECTED Final   Serratia marcescens NOT DETECTED NOT DETECTED Final   Haemophilus influenzae NOT DETECTED NOT DETECTED Final  Neisseria meningitidis NOT DETECTED NOT DETECTED Final   Pseudomonas aeruginosa NOT DETECTED NOT DETECTED Final   Stenotrophomonas maltophilia NOT DETECTED NOT DETECTED Final   Candida albicans NOT DETECTED NOT DETECTED Final   Candida auris NOT DETECTED NOT DETECTED Final   Candida glabrata NOT DETECTED NOT DETECTED Final   Candida krusei NOT DETECTED NOT DETECTED Final   Candida parapsilosis NOT DETECTED NOT DETECTED Final   Candida tropicalis NOT DETECTED NOT DETECTED Final   Cryptococcus neoformans/gattii NOT DETECTED NOT DETECTED Final   Methicillin resistance mecA/C NOT DETECTED NOT DETECTED Final    Comment: Performed at [facility], [address], [place], [place] [address]  Blood Culture (routine x 2)     Status: None   Collection Time: 12/18/22  2:55 AM   Specimen: BLOOD  Result Value Ref Range Status   Specimen Description   Final    BLOOD LEFT ANTECUBITAL Performed at [facility], [address], [place], [place] [address]    Special Requests   Final    Blood Culture adequate volume BOTTLES DRAWN AEROBIC AND ANAEROBIC Performed at [facility], [address], [place], [place] [address]    Culture   Final    NO GROWTH 5 DAYS Performed at [facility], [address], [place], [place] [address]    Report Status 12/23/2022 FINAL  Final  Pleural fluid culture w Gram Stain     Status: None   Collection Time: 12/18/22  1:12 PM   Specimen: Pleural Fluid  Result Value Ref Range Status   Specimen Description   Final    PLEURAL Performed at [facility], [address], [place], [place] [address]    Special Requests   Final    Immunocompromised Performed at  [facility], [address], [place], [place] [address]    Gram Stain   Final    FEW WBC PRESENT, PREDOMINANTLY MONONUCLEAR NO ORGANISMS SEEN    Culture   Final    NO GROWTH 3 DAYS Performed at [facility], [address], [place], [place] [address]    Report Status 12/21/2022 FINAL  Final  Acid Fast Smear (AFB)     Status: None   Collection Time: 12/18/22  1:12 PM   Specimen: Pleural Fluid  Result Value Ref Range Status   AFB Specimen Processing Concentration  Final   Acid Fast Smear Negative  Final    Comment: (NOTE) Performed At: [facility] [address] [place], [place] [id number] Jolene Schimke MD LK:[id number]    Source (AFB) PLEURAL  Final    Comment: Performed at [facility], [address], [place], [place] [address]  Blastomyces Antigen     Status: None   Collection Time: 12/19/22  9:04 AM  Result Value Ref Range Status   Blastomyces Antigen None Detected None Detected ng/mL Final    Comment: (NOTE) Reference Interval: None Detected Reportable Range: 0.31 ng/mL - 20.00 ng/mL Results above 20.00 ng/mL are reported as 'Positive, Above the Limit of Quantification' This test was developed and its performance characteristics determined by [employer]. It has not been cleared or approved by the FDA; however, FDA clearance or approval is not currently required for clinical use. The results are not intended to be used as the sole means for clinical diagnosis or patient decisions.    Specimen Type SERUM  Final    Comment: (NOTE) Performed At: [facility] [address] [place], [place] [id number] Roxanne Gates MD GE:[id number]   Culture, Respiratory w Gram Stain     Status: None (Preliminary result)   Collection Time: 12/21/22  2:05 PM   Specimen: Bronchial Alveolar Lavage; Respiratory  Result Value Ref Range Status   Specimen Description   Final    BRONCHIAL ALVEOLAR LAVAGE LEFT  UPPER LOBE Performed at [facility], [address] [place], [place] [address]    Special Requests   Final    BRONCHIAL ALVEOLAR LAVAGE LEFT UPPER LOBE Performed at [facility], [address], [place], [place] [address]    Gram Stain   Final    RARE WBC PRESENT, PREDOMINANTLY MONONUCLEAR NO ORGANISMS SEEN    Culture   Final    NO GROWTH 3 DAYS Performed at [facility], [address] [place], [place] [address]    Report Status PENDING  Incomplete  Culture, Respiratory w Gram Stain     Status: None (Preliminary result)   Collection Time: 12/21/22  2:05 PM   Specimen: Bronchial Alveolar Lavage; Respiratory  Result Value Ref Range Status   Specimen Description   Final    BRONCHIAL ALVEOLAR LAVAGE LEFT LOWER LOBE Performed at [facility], [address] [place], [place] [address]    Special Requests   Final    BRONCHIAL ALVEOLAR LAVAGE LEFT LOWER LOBE Performed at [facility], [address] [place], [place] [address]    Gram Stain NO WBC SEEN NO ORGANISMS SEEN   Final   Culture   Final    NO GROWTH 3 DAYS Performed at [facility], [address], [place], [place] [address]    Report Status PENDING  Incomplete  Acid Fast Smear (AFB)     Status: None   Collection Time: 12/21/22  2:05 PM   Specimen: Bronchial Alveolar Lavage; Respiratory  Result Value Ref Range Status   AFB Specimen Processing Concentration  Final   Acid Fast Smear Negative  Final    Comment: (NOTE) Performed At: [facility] [address] [place], [place] [id number] Jolene Schimke MD SA:[id number]    Source (AFB) BRONCHIAL ALVEOLAR LAVAGE  Final    Comment: LEFT UPPER LOBE Performed at [facility], [address], [place], [place] [address]   Acid Fast Smear (AFB)     Status: None   Collection Time: 12/21/22  2:05 PM   Specimen: Bronchial Alveolar Lavage; Respiratory  Result  Value Ref Range Status   AFB Specimen Processing Concentration  Final   Acid Fast Smear Negative  Final    Comment: (NOTE) Performed At: [facility] [address] [place], [place] [id number] Jolene Schimke MD CW:[id number]    Source (AFB) BRONCHIAL ALVEOLAR LAVAGE  Final    Comment:  LEFT LOWER LOBE Performed at [facility], [address] [place], [place] [address]   Anaerobic culture w Gram Stain     Status: None (Preliminary result)   Collection Time: 12/21/22  2:05 PM   Specimen: Bronchoalveolar Lavage  Result Value Ref Range Status   Specimen Description BRONCHIAL ALVEOLAR LAVAGE  Final   Special Requests LEFT LOWER LOBE  Final   Gram Stain   Final    WBC PRESENT, PREDOMINANTLY MONONUCLEAR NO ORGANISMS SEEN CYTOSPIN SMEAR Performed at [facility], [address], [place], [place] [address]    Culture   Final    NO ANAEROBES ISOLATED; CULTURE  IN PROGRESS FOR 5 DAYS   Report Status PENDING  Incomplete  Anaerobic culture w Gram Stain     Status: None (Preliminary result)   Collection Time: 12/21/22  2:05 PM   Specimen: Bronchoalveolar Lavage  Result Value Ref Range Status   Specimen Description BRONCHIAL ALVEOLAR LAVAGE  Final   Special Requests LEFT UPPER LOBE  Final   Gram Stain   Final    WBC PRESENT, PREDOMINANTLY MONONUCLEAR NO ORGANISMS SEEN CYTOSPIN SMEAR Performed at [facility], [address], [place], [place] [address]    Culture   Final    NO ANAEROBES ISOLATED; CULTURE IN PROGRESS FOR 5 DAYS   Report Status PENDING  Incomplete  CSF culture w Gram Stain     Status: None (Preliminary result)   Collection Time: 12/22/22 12:34 PM   Specimen: CSF; Cerebrospinal Fluid  Result Value Ref Range Status   Specimen Description   Final    CSF Performed at [facility], [address], [place], [place] [address]    Special Requests   Final    Normal Performed at [facility], [address], [place], [place] [address]    Gram Stain NO WBC SEEN NO ORGANISMS SEEN CYTOSPIN SMEAR   Final   Culture   Final    NO GROWTH 2 DAYS Performed at [facility], [address] [place], [place] [address]    Report Status PENDING  Incomplete     Medications:    bictegravir-emtricitabine-tenofovir AF  1 tablet Oral Daily   dronabinol  5 mg Oral BID AC   enoxaparin (LOVENOX) injection  40 mg Subcutaneous Q24H   feeding supplement  237 mL Oral BID BM   fluconazole  200 mg Oral Daily   Continuous Infusions:      LOS: 7 days   Marinda Elk  [facility]  12/25/2022, 10:48 AM

## 2022-12-25 NOTE — Progress Notes (Signed)
Subjective:  Christopher Henson is upset because his cell phone has been crushed by one of the transport vehicles that came in to bring him down for port placement.  The phone is ringing but Christopher Henson cannot answer it.  Clearly his phone should be replaced.  Christopher Henson is also quite stressed out understandably with regards to his lung cancer diagnosis   Antibiotics:  Anti-infectives (From admission, onward)    Start     Dose/Rate Route Frequency Ordered Stop   12/25/22 1145  sulfamethoxazole-trimethoprim (BACTRIM DS) 800-160 MG per tablet 1 tablet        1 tablet Oral Daily 12/25/22 1057     12/24/22 1000  Ampicillin-Sulbactam (UNASYN) 3 g in sodium chloride 0.9 % 100 mL IVPB        3 g 200 mL/hr over 30 Minutes Intravenous Every 6 hours 12/24/22 0929 12/25/22 0656   12/22/22 2030  fluconazole (DIFLUCAN) tablet 200 mg        200 mg Oral Daily 12/22/22 1915     12/18/22 2300  bictegravir-emtricitabine-tenofovir AF (BIKTARVY) 50-200-25 MG per tablet 1 tablet        1 tablet Oral Daily 12/18/22 1910     12/18/22 1100  Ampicillin-Sulbactam (UNASYN) 3 g in sodium chloride 0.9 % 100 mL IVPB  Status:  Discontinued        3 g 200 mL/hr over 30 Minutes Intravenous Every 6 hours 12/18/22 1054 12/24/22 0929   12/18/22 0500  sulfamethoxazole-trimethoprim (BACTRIM) 415 mg of trimethoprim in dextrose 5 % 500 mL IVPB  Status:  Discontinued        415 mg of trimethoprim 350.6 mL/hr over 90 Minutes Intravenous Every 6 hours 12/18/22 0427 12/18/22 1051       Medications: Scheduled Meds:  bictegravir-emtricitabine-tenofovir AF  1 tablet Oral Daily   dronabinol  5 mg Oral BID AC   enoxaparin (LOVENOX) injection  40 mg Subcutaneous Q24H   feeding supplement  237 mL Oral BID BM   fluconazole  200 mg Oral Daily   sulfamethoxazole-trimethoprim  1 tablet Oral Daily   Continuous Infusions: PRN Meds:.alum & mag hydroxide-simeth, HYDROcodone-acetaminophen, ipratropium-albuterol, ondansetron **OR** ondansetron  (ZOFRAN) IV, mouth rinse    Objective: Weight change:   Intake/Output Summary (Last 24 hours) at 12/25/2022 1337 Last data filed at 12/25/2022 0645 Gross per 24 hour  Intake 1117 ml  Output --  Net 1117 ml   Blood pressure (!) 137/98, pulse 77, temperature 98.4 F (36.9 C), temperature source Oral, resp. rate 18, height 6' (1.829 m), weight 78.9 kg, SpO2 97%. Temp:  [98.2 F (36.8 C)-98.6 F (37 C)] 98.4 F (36.9 C) (09/16 0505) Pulse Rate:  [70-95] 77 (09/16 0505) Resp:  [18-20] 18 (09/16 0505) BP: (134-154)/(96-103) 137/98 (09/16 0505) SpO2:  [97 %-100 %] 97 % (09/16 0505)  Physical Exam: Physical Exam Constitutional:      Appearance: Christopher Henson is well-developed.  HENT:     Head: Normocephalic and atraumatic.  Eyes:     Conjunctiva/sclera: Conjunctivae normal.  Cardiovascular:     Rate and Rhythm: Normal rate and regular rhythm.  Pulmonary:     Effort: Pulmonary effort is normal. No respiratory distress.     Breath sounds: No wheezing.  Abdominal:     General: There is no distension.     Palpations: Abdomen is soft.  Musculoskeletal:        General: Normal range of motion.     Cervical back: Normal range of motion and  neck supple.  Skin:    General: Skin is warm and dry.     Findings: No erythema or rash.  Neurological:     General: No focal deficit present.     Mental Status: Christopher Henson is alert and oriented to person, place, and time.  Psychiatric:        Attention and Perception: Attention normal.        Mood and Affect: Mood is depressed.        Behavior: Behavior normal.        Thought Content: Thought content normal.        Cognition and Memory: Cognition and memory normal.        Judgment: Judgment normal.      CBC:    BMET Recent Labs    12/24/22 0944  NA 137  K 3.7  CL 103  CO2 24  GLUCOSE 97  BUN 12  CREATININE 1.15  CALCIUM 9.0     Liver Panel  Recent Labs    12/24/22 0944  PROT 7.0  ALBUMIN 3.8  AST 18  ALT 22  ALKPHOS 67  BILITOT  0.5       Sedimentation Rate No results for input(s): "ESRSEDRATE" in the last 72 hours. C-Reactive Protein No results for input(s): "CRP" in the last 72 hours.  Micro Results: Recent Results (from the past 720 hour(s))  Resp panel by RT-PCR (RSV, Flu A&B, Covid) Anterior Nasal Swab     Status: None   Collection Time: 12/18/22  2:35 AM   Specimen: Anterior Nasal Swab  Result Value Ref Range Status   SARS Coronavirus 2 by RT PCR NEGATIVE NEGATIVE Final    Comment: (NOTE) SARS-CoV-2 target nucleic acids are NOT DETECTED.  The SARS-CoV-2 RNA is generally detectable in upper respiratory specimens during the acute phase of infection. The lowest concentration of SARS-CoV-2 viral copies this assay can detect is 138 copies/mL. A negative result does not preclude SARS-Cov-2 infection and should not be used as the sole basis for treatment or other patient management decisions. A negative result may occur with  improper specimen collection/handling, submission of specimen other than nasopharyngeal swab, presence of viral mutation(s) within the areas targeted by this assay, and inadequate number of viral copies(<138 copies/mL). A negative result must be combined with clinical observations, patient history, and epidemiological information. The expected result is Negative.  Fact Sheet for Patients:  BloggerCourse.com  Fact Sheet for Healthcare Providers:  SeriousBroker.it  This test is no t yet approved or cleared by the [place] FDA and  has been authorized for detection and/or diagnosis of SARS-CoV-2 by FDA under an Emergency Use Authorization (EUA). This EUA will remain  in effect (meaning this test can be used) for the duration of the COVID-19 declaration under Section 564(b)(1) of the Act, 21 U.S.C.section 360bbb-3(b)(1), unless the authorization is terminated  or revoked sooner.       Influenza A by PCR NEGATIVE NEGATIVE  Final   Influenza B by PCR NEGATIVE NEGATIVE Final    Comment: (NOTE) The Xpert Xpress SARS-CoV-2/FLU/RSV plus assay is intended as an aid in the diagnosis of influenza from Nasopharyngeal swab specimens and should not be used as a sole basis for treatment. Nasal washings and aspirates are unacceptable for Xpert Xpress SARS-CoV-2/FLU/RSV testing.  Fact Sheet for Patients: BloggerCourse.com  Fact Sheet for Healthcare Providers: SeriousBroker.it  This test is not yet approved or cleared by the [place] FDA and has been authorized for detection and/or diagnosis of SARS-CoV-2  by FDA under an Emergency Use Authorization (EUA). This EUA will remain in effect (meaning this test can be used) for the duration of the COVID-19 declaration under Section 564(b)(1) of the Act, 21 U.S.C. section 360bbb-3(b)(1), unless the authorization is terminated or revoked.     Resp Syncytial Virus by PCR NEGATIVE NEGATIVE Final    Comment: (NOTE) Fact Sheet for Patients: BloggerCourse.com  Fact Sheet for Healthcare Providers: SeriousBroker.it  This test is not yet approved or cleared by the [place] FDA and has been authorized for detection and/or diagnosis of SARS-CoV-2 by FDA under an Emergency Use Authorization (EUA). This EUA will remain in effect (meaning this test can be used) for the duration of the COVID-19 declaration under Section 564(b)(1) of the Act, 21 U.S.C. section 360bbb-3(b)(1), unless the authorization is terminated or revoked.  Performed at [facility], [address], [place], [place] [address]   Blood Culture (routine x 2)     Status: Abnormal   Collection Time: 12/18/22  2:50 AM   Specimen: BLOOD  Result Value Ref Range Status   Specimen Description   Final    BLOOD RIGHT ANTECUBITAL Performed at [facility], [address], [place], [place] [address]    Special Requests   Final    Blood Culture adequate volume BOTTLES DRAWN AEROBIC AND ANAEROBIC Performed at [facility], [address], [place], [place] [address]    Culture  Setup Time   Final    GRAM POSITIVE COCCI ANAEROBIC BOTTLE ONLY CRITICAL RESULT CALLED TO, READ BACK BY AND VERIFIED WITH: PHARMD JUSTIN LEGGE [id number] AT 0829 BY EC    Culture (A)  Final    STAPHYLOCOCCUS EPIDERMIDIS THE SIGNIFICANCE OF ISOLATING THIS ORGANISM FROM A SINGLE SET OF BLOOD CULTURES WHEN MULTIPLE SETS ARE DRAWN IS UNCERTAIN. PLEASE NOTIFY THE MICROBIOLOGY DEPARTMENT WITHIN ONE WEEK IF SPECIATION AND SENSITIVITIES ARE REQUIRED. Performed at [facility], [address], [place], [place] [address]    Report Status 12/21/2022 FINAL  Final  Blood Culture ID Panel (Reflexed)     Status: Abnormal   Collection Time: 12/18/22  2:50 AM  Result Value Ref Range Status   Enterococcus faecalis NOT DETECTED NOT DETECTED Final   Enterococcus Faecium NOT DETECTED NOT DETECTED Final   Listeria monocytogenes NOT DETECTED NOT DETECTED Final   Staphylococcus species DETECTED (A) NOT DETECTED Final    Comment: CRITICAL RESULT CALLED TO, READ BACK BY AND VERIFIED WITH: PHARMD JUSTIN LEGGE [id number] AT 0829 BY EC    Staphylococcus aureus (BCID) NOT DETECTED NOT DETECTED Final   Staphylococcus epidermidis DETECTED (A) NOT DETECTED Final    Comment: CRITICAL RESULT CALLED TO, READ BACK BY AND VERIFIED WITH: PHARMD JUSTIN LEGGE [id number] 0829 BY EC    Staphylococcus lugdunensis NOT DETECTED NOT DETECTED Final   Streptococcus species NOT DETECTED NOT DETECTED Final   Streptococcus agalactiae NOT DETECTED NOT DETECTED Final   Streptococcus pneumoniae NOT DETECTED NOT DETECTED Final   Streptococcus pyogenes NOT DETECTED NOT DETECTED Final   A.calcoaceticus-baumannii NOT DETECTED NOT DETECTED Final   Bacteroides fragilis NOT DETECTED NOT DETECTED Final    Enterobacterales NOT DETECTED NOT DETECTED Final   Enterobacter cloacae complex NOT DETECTED NOT DETECTED Final   Escherichia coli NOT DETECTED NOT DETECTED Final   Klebsiella aerogenes NOT DETECTED NOT DETECTED Final   Klebsiella oxytoca NOT DETECTED NOT DETECTED Final   Klebsiella pneumoniae NOT DETECTED NOT DETECTED Final   Proteus species NOT DETECTED  NOT DETECTED Final   Salmonella species NOT DETECTED NOT DETECTED Final   Serratia marcescens NOT DETECTED NOT DETECTED Final   Haemophilus influenzae NOT DETECTED NOT DETECTED Final   Neisseria meningitidis NOT DETECTED NOT DETECTED Final   Pseudomonas aeruginosa NOT DETECTED NOT DETECTED Final   Stenotrophomonas maltophilia NOT DETECTED NOT DETECTED Final   Candida albicans NOT DETECTED NOT DETECTED Final   Candida auris NOT DETECTED NOT DETECTED Final   Candida glabrata NOT DETECTED NOT DETECTED Final   Candida krusei NOT DETECTED NOT DETECTED Final   Candida parapsilosis NOT DETECTED NOT DETECTED Final   Candida tropicalis NOT DETECTED NOT DETECTED Final   Cryptococcus neoformans/gattii NOT DETECTED NOT DETECTED Final   Methicillin resistance mecA/C NOT DETECTED NOT DETECTED Final    Comment: Performed at [facility], [address], [place], [place] [address]  Blood Culture (routine x 2)     Status: None   Collection Time: 12/18/22  2:55 AM   Specimen: BLOOD  Result Value Ref Range Status   Specimen Description   Final    BLOOD LEFT ANTECUBITAL Performed at [facility], [address] [place], [place] [address]    Special Requests   Final    Blood Culture adequate volume BOTTLES DRAWN AEROBIC AND ANAEROBIC Performed at [facility], [address], [place], [place] [address]    Culture   Final    NO GROWTH 5 DAYS Performed at [facility], [address], [place], [place] [address]    Report Status 12/23/2022 FINAL  Final  Pleural fluid culture w Gram Stain     Status:  None   Collection Time: 12/18/22  1:12 PM   Specimen: Pleural Fluid  Result Value Ref Range Status   Specimen Description   Final    PLEURAL Performed at [facility], [address], [place], [place] [address]    Special Requests   Final    Immunocompromised Performed at [facility], [address], [place], [place] [address]    Gram Stain   Final    FEW WBC PRESENT, PREDOMINANTLY MONONUCLEAR NO ORGANISMS SEEN    Culture   Final    NO GROWTH 3 DAYS Performed at [facility], [address], [place], [place] [address]    Report Status 12/21/2022 FINAL  Final  Acid Fast Smear (AFB)     Status: None   Collection Time: 12/18/22  1:12 PM   Specimen: Pleural Fluid  Result Value Ref Range Status   AFB Specimen Processing Concentration  Final   Acid Fast Smear Negative  Final    Comment: (NOTE) Performed At: [facility] [address] [place], [place] [id number] Jolene Schimke MD [id number]    Source (AFB) PLEURAL  Final    Comment: Performed at [facility], [address], [place], [place] [address]  Blastomyces Antigen     Status: None   Collection Time: 12/19/22  9:04 AM  Result Value Ref Range Status   Blastomyces Antigen None Detected None Detected ng/mL Final    Comment: (NOTE) Reference Interval: None Detected Reportable Range: 0.31 ng/mL - 20.00 ng/mL Results above 20.00 ng/mL are reported as 'Positive, Above the Limit of Quantification' This test was developed and its performance characteristics determined by [employer]. It has not been cleared or approved by the FDA; however, FDA clearance or approval is not currently required for clinical use. The results are not  intended to be used as the sole means for clinical diagnosis or patient decisions.    Specimen Type SERUM  Final    Comment: (NOTE) Performed At: [facility] [address] [place], [place]  [id number] Roxanne Gates MD NU:[id number]   Culture, Respiratory w Gram Stain     Status: None   Collection Time: 12/21/22  2:05 PM   Specimen: Bronchial Alveolar Lavage; Respiratory  Result Value Ref Range Status   Specimen Description   Final    BRONCHIAL ALVEOLAR LAVAGE LEFT UPPER LOBE Performed at [facility], [address] [place], [place] [address]    Special Requests   Final    BRONCHIAL ALVEOLAR LAVAGE LEFT UPPER LOBE Performed at [facility], [address] [place], [place] [address]    Gram Stain   Final    RARE WBC PRESENT, PREDOMINANTLY MONONUCLEAR NO ORGANISMS SEEN    Culture   Final    NO GROWTH 3 DAYS Performed at [facility], [address] [place], [place] [address]    Report Status 12/25/2022 FINAL  Final  Culture, Respiratory w Gram Stain     Status: None   Collection Time: 12/21/22  2:05 PM   Specimen: Bronchial Alveolar Lavage; Respiratory  Result Value Ref Range Status   Specimen Description   Final    BRONCHIAL ALVEOLAR LAVAGE LEFT LOWER LOBE Performed at [facility], [address] [place], [place] [address]    Special Requests   Final    BRONCHIAL ALVEOLAR LAVAGE LEFT LOWER LOBE Performed at [facility], [address], [place], [place] [address]    Gram Stain NO WBC SEEN NO ORGANISMS SEEN   Final   Culture   Final    NO GROWTH 3 DAYS Performed at [facility], [address], [place], [place] [address]    Report Status 12/25/2022 FINAL  Final  Acid Fast Smear (AFB)     Status: None   Collection Time: 12/21/22  2:05 PM   Specimen: Bronchial Alveolar Lavage; Respiratory  Result Value Ref Range Status   AFB Specimen Processing Concentration  Final   Acid Fast Smear Negative  Final    Comment: (NOTE) Performed At: [facility] [address] [place], [place] [id number] Jolene Schimke MD [id number]    Source (AFB) BRONCHIAL ALVEOLAR  LAVAGE  Final    Comment: LEFT UPPER LOBE Performed at [facility], [address], [place], [place] [address]   Acid Fast Smear (AFB)     Status: None   Collection Time: 12/21/22  2:05 PM   Specimen: Bronchial Alveolar Lavage; Respiratory  Result Value Ref Range Status   AFB Specimen Processing Concentration  Final   Acid Fast Smear Negative  Final    Comment: (NOTE) Performed At: [facility] [address] [place], [place] [id number] Jolene Schimke MD [id number]    Source (AFB) BRONCHIAL ALVEOLAR LAVAGE  Final    Comment:  LEFT LOWER LOBE Performed at [facility], [address] [place], [place] [address]   Anaerobic culture w Gram Stain     Status: None (Preliminary result)   Collection Time: 12/21/22  2:05 PM   Specimen: Bronchoalveolar Lavage  Result Value Ref Range Status   Specimen Description BRONCHIAL ALVEOLAR LAVAGE  Final   Special Requests LEFT LOWER LOBE  Final   Gram Stain   Final    WBC PRESENT, PREDOMINANTLY MONONUCLEAR NO ORGANISMS  SEEN CYTOSPIN SMEAR Performed at [facility], [address] [place], [place] [address]    Culture   Final    NO ANAEROBES ISOLATED; CULTURE IN PROGRESS FOR 5 DAYS   Report Status PENDING  Incomplete  Anaerobic culture w Gram Stain     Status: None (Preliminary result)   Collection Time: 12/21/22  2:05 PM   Specimen: Bronchoalveolar Lavage  Result Value Ref Range Status   Specimen Description BRONCHIAL ALVEOLAR LAVAGE  Final   Special Requests LEFT UPPER LOBE  Final   Gram Stain   Final    WBC PRESENT, PREDOMINANTLY MONONUCLEAR NO ORGANISMS SEEN CYTOSPIN SMEAR Performed at [facility], [address], [place], [place] [address]    Culture   Final    NO ANAEROBES ISOLATED; CULTURE IN PROGRESS FOR 5 DAYS   Report Status PENDING  Incomplete  CSF culture w Gram Stain     Status: None   Collection Time: 12/22/22 12:34 PM   Specimen: CSF; Cerebrospinal Fluid   Result Value Ref Range Status   Specimen Description   Final    CSF Performed at [facility], [address] [place], [place] [address]    Special Requests   Final    Normal Performed at [facility], [address] [place], [place] [address]    Gram Stain NO WBC SEEN NO ORGANISMS SEEN CYTOSPIN SMEAR   Final   Culture   Final    NO GROWTH 3 DAYS Performed at [facility], [address], [place], [place] [address]    Report Status 12/25/2022 FINAL  Final    Studies/Results: MR BRAIN W WO CONTRAST  Result Date: 12/24/2022 CLINICAL DATA:  Non-small cell lung cancer staging EXAM: MRI HEAD WITHOUT AND WITH CONTRAST TECHNIQUE: Multiplanar, multiecho pulse sequences of the brain and surrounding structures were obtained without and with intravenous contrast. CONTRAST:  7mL GADAVIST GADOBUTROL 1 MMOL/ML IV SOLN COMPARISON:  Head CT from 4 days ago FINDINGS: Brain: No enhancement or swelling to suggest metastatic disease. FLAIR hyperintensity in the cerebral white matter especially affecting the frontal lobes and the left centrum semiovale, without involvement of subcortical U fibers, mass effect, or enhancement. No acute or subacute infarct. No hemorrhage, hydrocephalus, or mass. Vascular: Major flow voids are preserved Skull and upper cervical spine: Normal marrow signal Sinuses/Orbits: Negative IMPRESSION: 1. No enhancement to suggest metastatic disease. 2. 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 Electronically Signed   By: Tiburcio Pea M.D.   On: 12/24/2022 11:45      Assessment/Plan:  INTERVAL HISTORY:  Patient to start chemotherapy shortly       Principal Problem:   Small cell lung cancer, left (HCC) Active Problems:   HIV (human immunodeficiency virus infection) (HCC)   Pleural effusion on left   Hypokalemia   Alcoholism (HCC)   Smoker    Christopher Henson is a 42 y.o. 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  I have restarted his fluconazole given his history of cryptococcal meningitis I noticed that on pathology  in the long cryptococcal organisms were isolated though none have grown on culture  Christopher Henson has a quite high cryptococcal titer and while this likely represents deceased organisms, the risk that Christopher Henson could reactivate in the context of immunosuppression is sufficiently great that I have started him on fluconazole 200 mg.  I am also going to restart Bactrim for PCP prophylaxis in anticipation of CD4 count likely dropping    #3 Depression: would ensure Christopher Henson the best emotional support possible.  Note his telephone was broken this morning when Christopher Henson was being transported.  In my opinion the institute should compensate him/provide him with a new phone    Christopher Henson has an appointment on 01/24/2023 at 1030 AM with Dr. [name] at  [facility], which  is located in the [facility] at  [address] in [place].  [address], which is located to the left of the elevators.  Phone: [phone]  Fax: [phone]  [email or web address]  The patient should arrive 30 minutes prior to their appoitment.   I have personally spent 52 minutes involved in face-to-face and non-face-to-face activities for this patient on the day of the visit. Professional time spent includes the following activities: Preparing to see the patient (review of tests), Obtaining and/or reviewing separately obtained history (admission/discharge record), Performing a medically  appropriate examination and/or evaluation , Ordering medications/tests/procedures, referring and communicating with other health care professionals, Documenting clinical information in the EMR, Independently interpreting results (not separately reported), Communicating results to the patient/family/caregiver, Counseling and educating the patient/family/caregiver and Care coordination (not separately reported).    I will continue to keep him on our rounding list to check in with him again this week but I do not think Christopher Henson has any active issues and I have arranged hospital follow-up for him.     LOS: 7 days   Acey Lav 12/25/2022, 1:37 PM

## 2022-12-25 NOTE — Progress Notes (Addendum)
Mr. Spaur is now up on 6 E.  We will try to get chemotherapy started tomorrow.  He goes for the Port-A-Cath today.  He is still having chest pain.  I am not surprised by this given the size of this mediastinal mass.  He had a negative MRI of the brain.  I suspect that the bone marrow biopsy should be set up for the next couple days.  I still think we need to do this given the markedly elevated LDH.  We clearly will need to have Infectious Disease  involved so they can help make sure that his HIV is under good control.  His labs showed white cell count 7.  Hemoglobin 16.7.  Platelet count 236,000.  On his physical exam, temperature 98.4.  Pulse 77.  Blood pressure 137/98.  Head and neck exam shows no ocular or oral lesions.  There may be a little bit of fullness in the left supraclavicular fossa.  Lungs sound clear bilaterally.  Cardiac exam regular rate and rhythm.  Abdomen is soft.  Bowel sounds are present.  There is no fluid wave.  Extremity shows no clubbing, cyanosis or edema.  Neurological exam is nonfocal.   We will go ahead and get Mr. Whalley started on treatment.  I have put in the orders and are ready.    I suspect that the bone marrow might be positive.  His blood counts still look okay.  Again, we really need to get Infectious Disease involved to make sure that his HIV is under good control.  He will be immunosuppressed by his chemoimmunotherapy.  I know that he will get a outstanding care from everybody up on 6 E.   Christin Bach, MD  Philippians 4:13

## 2022-12-25 NOTE — Progress Notes (Signed)
START ON PATHWAY REGIMEN - Small Cell Lung     Cycles 1 through 4, every 21 days:     Atezolizumab      Carboplatin      Etoposide    Cycles 5 and beyond, every 21 days:     Atezolizumab   **Always confirm dose/schedule in your pharmacy ordering system**  Patient Characteristics: Newly Diagnosed, Preoperative or Nonsurgical Candidate (Clinical Staging), First Line, Extensive Stage Therapeutic Status: Newly Diagnosed, Preoperative or Nonsurgical Candidate (Clinical Staging) AJCC T Category: cT3 AJCC N Category: cN2 AJCC M Category: pM1a AJCC 8 Stage Grouping: IVA Stage Classification: Extensive  Intent of Therapy: Non-Curative / Palliative Intent, Discussed with Patient

## 2022-12-26 ENCOUNTER — Other Ambulatory Visit: Payer: Self-pay

## 2022-12-26 DIAGNOSIS — B20 Human immunodeficiency virus [HIV] disease: Secondary | ICD-10-CM | POA: Diagnosis not present

## 2022-12-26 DIAGNOSIS — C3492 Malignant neoplasm of unspecified part of left bronchus or lung: Secondary | ICD-10-CM | POA: Diagnosis not present

## 2022-12-26 DIAGNOSIS — J9859 Other diseases of mediastinum, not elsewhere classified: Secondary | ICD-10-CM | POA: Diagnosis not present

## 2022-12-26 LAB — CBC WITH DIFFERENTIAL/PLATELET
Abs Immature Granulocytes: 0.04 10*3/uL (ref 0.00–0.07)
Basophils Absolute: 0.1 10*3/uL (ref 0.0–0.1)
Basophils Relative: 1 %
Eosinophils Absolute: 0.1 10*3/uL (ref 0.0–0.5)
Eosinophils Relative: 2 %
HCT: 48 % (ref 39.0–52.0)
Hemoglobin: 16.6 g/dL (ref 13.0–17.0)
Immature Granulocytes: 1 %
Lymphocytes Relative: 30 %
Lymphs Abs: 2.4 10*3/uL (ref 0.7–4.0)
MCH: 31.1 pg (ref 26.0–34.0)
MCHC: 34.6 g/dL (ref 30.0–36.0)
MCV: 90.1 fL (ref 80.0–100.0)
Monocytes Absolute: 0.7 10*3/uL (ref 0.1–1.0)
Monocytes Relative: 9 %
Neutro Abs: 4.6 10*3/uL (ref 1.7–7.7)
Neutrophils Relative %: 57 %
Platelets: 249 10*3/uL (ref 150–400)
RBC: 5.33 MIL/uL (ref 4.22–5.81)
RDW: 13.5 % (ref 11.5–15.5)
WBC: 7.9 10*3/uL (ref 4.0–10.5)
nRBC: 0 % (ref 0.0–0.2)

## 2022-12-26 LAB — COMPREHENSIVE METABOLIC PANEL
ALT: 32 U/L (ref 0–44)
AST: 22 U/L (ref 15–41)
Albumin: 4 g/dL (ref 3.5–5.0)
Alkaline Phosphatase: 71 U/L (ref 38–126)
Anion gap: 9 (ref 5–15)
BUN: 13 mg/dL (ref 6–20)
CO2: 25 mmol/L (ref 22–32)
Calcium: 9.2 mg/dL (ref 8.9–10.3)
Chloride: 104 mmol/L (ref 98–111)
Creatinine, Ser: 1.15 mg/dL (ref 0.61–1.24)
GFR, Estimated: >60 mL/min (ref >60)
Glucose, Bld: 89 mg/dL (ref 70–99)
Potassium: 3.9 mmol/L (ref 3.5–5.1)
Sodium: 138 mmol/L (ref 135–145)
Total Bilirubin: 0.7 mg/dL (ref 0.3–1.2)
Total Protein: 7.1 g/dL (ref 6.5–8.1)

## 2022-12-26 LAB — ANAEROBIC CULTURE W GRAM STAIN

## 2022-12-26 LAB — CD4/CD8 (T-HELPER/T-SUPPRESSOR CELL)
CD4 absolute: 356 /uL — ABNORMAL LOW (ref 400–1790)
CD4%: 19.51 % — ABNORMAL LOW (ref 33–65)
CD8 T Cell Abs: 460 /uL (ref 190–1000)
CD8tox: 25.17 % (ref 12–40)
Ratio: 0.78 — ABNORMAL LOW (ref 1.0–3.0)
Total lymphocyte count: 1826 /uL (ref 1000–4000)

## 2022-12-26 LAB — LACTATE DEHYDROGENASE: LDH: 684 U/L — ABNORMAL HIGH (ref 98–192)

## 2022-12-26 MED ORDER — OXYCODONE HCL 5 MG PO TABS
10.0000 mg | ORAL_TABLET | ORAL | Status: DC | PRN
  Administered 2022-12-26 – 2023-01-02 (×26): 10 mg via ORAL
  Filled 2022-12-26 (×26): qty 2

## 2022-12-26 NOTE — Progress Notes (Signed)
Patient ID: Christopher Henson, male   DOB: 01/13/1981, 42 y.o.   MRN: [id number] Since our dept could not do BM bx today we have pt scheduled for one session of moderate sedation with both port a cath placement and BM bx on 9/18 am ; nurse aware

## 2022-12-26 NOTE — Progress Notes (Signed)
[facility] PROGRESS NOTE    Progress Note  [name]  ZOX:[id number] DOB: Feb 15, 1981 DOA: 12/18/2022 PCP: [name], PA-C     Brief Narrative:   Christopher Henson is an 42 y.o. 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 CT angio showed no PE but showed an 8 x 7 anterior mediastinal mass in the left hilar region with mass effect on the left upper lobe pulmonary artery, left upper lobe pulmonary nodule 1.6 x 1.2 cm and small left increased size pleural effusion . IR consulted for thoracentesis.  PCCM consulted and bronchoscopy done 12/21/2022.  ID was consulted he completed 7-day course of IV antibiotics  Significant events: 12/17/2024 CT scan of the abdomen pelvis showed no metastatic disease in the abdomen and pelvis. Findings 12/21/2022 thoracocentesis 800 cc of fluid cytology showed malignant cells consistent with small cell lung cancer 12/21/2022 bronchoscopy biopsy consistent with malignancies and organism morphology consistent with cryptococcus identified 12/21/2020 for lumbar puncture ultrasound-guided showed cryptococcal antigen negative 12/25/2022 brain MRI showed no acute findings no metastatic disease. 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 Completed course of antibiotics  Status post bronchoscopy with biopsy by pulmonary.   ID was consulted and recommended to complete course of IV Unasyn. Oncology was consulted recommended MRI brain that showed no acute findings and to start chemotherapy as an inpatient.   HIV: With a CD4 count of 258. Noncompliance with his medication. ID was consulted recommended to [facility]. He was also started diflucan. ID also started  Bactrim for PCP prophylaxis as a CD4 count is likely to drop.  Serum positive cryptococcal antigen: ID recommended fluoroscopy guided lumbar puncture, opening pressure was 21 cm of water.No Signs of cryptococcal infection. Because his count was still high ID recommended to continue to treat with Diflucan.  And continue as an outpatient until he sees them. Has an appointment with ID on 01/24/2023  Tobacco abuse: Nicotine patch was described.  Alcohol use: No signs of withdrawal continue thiamine and folate.  Noncompliance: Has been counseled.  Hypokalemia: Repleted now resolved.  Blood cultures positive : For Staph epidermidis 1 out of 4 likely contaminant.   DVT prophylaxis: none Family Communication:none Status is: Inpatient Remains inpatient appropriate because: Mediastinal mass    Code Status:     Code Status Orders  (From admission, onward)           Start     Ordered   12/18/22 0828  Full code  Continuous       Question:  By:  Answer:  Consent: discussion documented in EHR   12/18/22 0828           Code Status History     Date Active Date Inactive Code Status Order ID Comments User Context   06/08/2018 0855 06/21/2018 1833 Full Code [id number]  Rolly Salter, MD Inpatient         IV Access:   Peripheral IV   Procedures and diagnostic studies:   MR BRAIN W WO CONTRAST  Result Date: 12/24/2022 CLINICAL DATA:  Non-small cell lung cancer staging EXAM: MRI HEAD WITHOUT AND WITH CONTRAST TECHNIQUE: Multiplanar, multiecho pulse sequences of the brain and surrounding structures were obtained without and with intravenous contrast. CONTRAST:  7mL GADAVIST GADOBUTROL 1 MMOL/ML  IV SOLN COMPARISON:  Head CT from 4 days ago FINDINGS: Brain: No enhancement or swelling to suggest metastatic disease. FLAIR hyperintensity in the cerebral white matter especially affecting the frontal lobes and the left centrum semiovale, without involvement of subcortical U  fibers, mass effect, or enhancement. No acute or subacute infarct. No hemorrhage, hydrocephalus, or mass. Vascular: Major flow voids are preserved Skull and upper cervical spine: Normal marrow signal Sinuses/Orbits: Negative IMPRESSION: 1. No enhancement to suggest metastatic disease. 2. 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 Electronically Signed   By: Tiburcio Pea M.D.   On: 12/24/2022 11:45     Medical Consultants:   None.   Subjective:    [name] no complaints  Objective:    Vitals:   12/25/22 0505 12/25/22 1357 12/25/22 1956 12/26/22 0518  BP: (!) 137/98 (!) 141/92 97/86 (!) 138/95  Pulse: 77 100 98 75  Resp: 18  17 18   Temp: 98.4 F (36.9 C) 97.9 F (36.6 C) 97.9 F (36.6 C) 98 F (36.7 C)  TempSrc: Oral Oral Oral Oral  SpO2: 97% 97% 95% 99%  Weight:      Height:       SpO2: 99 % O2 Flow Rate (L/min): 2 L/min   Intake/Output Summary (Last 24 hours) at 12/26/2022 0954 Last data filed at 12/25/2022 1900 Gross per 24 hour  Intake 120 ml  Output --  Net 120 ml   Filed Weights   12/18/22 2115  Weight: 78.9 kg    Exam: General exam: In no acute distress. Respiratory system: Good air movement and clear to auscultation. Cardiovascular system: S1 & S2 heard, RRR. No JVD. Gastrointestinal system: Abdomen is nondistended, soft and nontender.  Extremities: No pedal edema. Skin: No rashes, lesions or ulcers Psychiatry: Judgement and insight appear normal. Mood & affect appropriate. Data Reviewed:    Labs: Basic Metabolic Panel: Recent Labs  Lab 12/20/22 1002 12/21/22 1813 12/24/22 0944  NA 139 138 137  K 3.9 3.8 3.7  CL 102 102 103  CO2 27 25 24   GLUCOSE 96 193* 97  BUN 9 11 12   CREATININE 1.16 1.37* 1.15  CALCIUM 9.0 9.1 9.0  MG 2.3 2.4  --   PHOS 3.1 2.2*  --    GFR Estimated Creatinine Clearance: 90 mL/min (by C-G formula based on SCr of 1.15 mg/dL). Liver Function Tests: Recent Labs   Lab 12/20/22 1002 12/21/22 1813 12/24/22 0944  AST  --   --  18  ALT  --   --  22  ALKPHOS  --   --  67  BILITOT  --   --  0.5  PROT  --   --  7.0  ALBUMIN 4.0 4.2 3.8   No results for input(s): "LIPASE", "AMYLASE" in the last 168 hours. No results for input(s): "AMMONIA" in the last 168 hours. Coagulation profile No results for input(s): "INR", "PROTIME" in the last 168 hours.  COVID-19 Labs  Recent Labs    12/24/22 0944  LDH 602*     Lab Results  Component Value Date   SARSCOV2NAA NEGATIVE 12/18/2022   SARSCOV2NAA Not Detected 12/12/2019    CBC: Recent Labs  Lab 12/20/22 1002 12/21/22 1813 12/24/22 0944 12/25/22 0542 12/26/22 0832  WBC 6.4 10.2 6.6 7.0 7.9  NEUTROABS  --   --  3.7 3.8 4.6  HGB 18.2* 17.2* 16.5 16.7 16.6  HCT 52.1* 49.0 46.9 47.9 48.0  MCV 91.2 90.7 90.2 92.1  90.1  PLT 220 226 222 236 249   Cardiac Enzymes: No results for input(s): "CKTOTAL", "CKMB", "CKMBINDEX", "TROPONINI" in the last 168 hours. BNP (last 3 results) No results for input(s): "PROBNP" in the last 8760 hours. CBG: No results for input(s): "GLUCAP" in the last 168 hours. D-Dimer: No results for input(s): "DDIMER" in the last 72 hours. Hgb A1c: No results for input(s): "HGBA1C" in the last 72 hours. Lipid Profile: No results for input(s): "CHOL", "HDL", "LDLCALC", "TRIG", "CHOLHDL", "LDLDIRECT" in the last 72 hours.  Thyroid function studies: No results for input(s): "TSH", "T4TOTAL", "T3FREE", "THYROIDAB" in the last 72 hours.  Invalid input(s): "FREET3" Anemia work up: No results for input(s): "VITAMINB12", "FOLATE", "FERRITIN", "TIBC", "IRON", "RETICCTPCT" in the last 72 hours. Sepsis Labs: Recent Labs  Lab 12/21/22 1813 12/24/22 0944 12/25/22 0542 12/26/22 0832  WBC 10.2 6.6 7.0 7.9   Microbiology Recent Results (from the past 240 hour(s))  Resp panel by RT-PCR (RSV, Flu A&B, Covid) Anterior Nasal Swab     Status: None   Collection Time: 12/18/22  2:35  AM   Specimen: Anterior Nasal Swab  Result Value Ref Range Status   SARS Coronavirus 2 by RT PCR NEGATIVE NEGATIVE Final    Comment: (NOTE) SARS-CoV-2 target nucleic acids are NOT DETECTED.  The SARS-CoV-2 RNA is generally detectable in upper respiratory specimens during the acute phase of infection. The lowest concentration of SARS-CoV-2 viral copies this assay can detect is 138 copies/mL. A negative result does not preclude SARS-Cov-2 infection and should not be used as the sole basis for treatment or other patient management decisions. A negative result may occur with  improper specimen collection/handling, submission of specimen other than nasopharyngeal swab, presence of viral mutation(s) within the areas targeted by this assay, and inadequate number of viral copies(<138 copies/mL). A negative result must be combined with clinical observations, patient history, and epidemiological information. The expected result is Negative.  Fact Sheet for Patients:  BloggerCourse.com  Fact Sheet for Healthcare Providers:  SeriousBroker.it  This test is no t yet approved or cleared by the [place] FDA and  has been authorized for detection and/or diagnosis of SARS-CoV-2 by FDA under an Emergency Use Authorization (EUA). This EUA will remain  in effect (meaning this test can be used) for the duration of the COVID-19 declaration under Section 564(b)(1) of the Act, 21 U.S.C.section 360bbb-3(b)(1), unless the authorization is terminated  or revoked sooner.       Influenza A by PCR NEGATIVE NEGATIVE Final   Influenza B by PCR NEGATIVE NEGATIVE Final    Comment: (NOTE) The Xpert Xpress SARS-CoV-2/FLU/RSV plus assay is intended as an aid in the diagnosis of influenza from Nasopharyngeal swab specimens and should not be used as a sole basis for treatment. Nasal washings and aspirates are unacceptable for Xpert Xpress  SARS-CoV-2/FLU/RSV testing.  Fact Sheet for Patients: BloggerCourse.com  Fact Sheet for Healthcare Providers: SeriousBroker.it  This test is not yet approved or cleared by the [place] FDA and has been authorized for detection and/or diagnosis of SARS-CoV-2 by FDA under an Emergency Use Authorization (EUA). This EUA will remain in effect (meaning this test can be used) for the duration of the COVID-19 declaration under Section 564(b)(1) of the Act, 21 U.S.C. section 360bbb-3(b)(1), unless the authorization is terminated or revoked.     Resp Syncytial Virus by PCR NEGATIVE NEGATIVE Final    Comment: (NOTE) Fact Sheet for Patients: BloggerCourse.com  Fact Sheet for Healthcare Providers: SeriousBroker.it  This test  is not yet approved or cleared by the [place] and has been authorized for detection and/or diagnosis of SARS-CoV-2 by FDA under an Emergency Use Authorization (EUA). This EUA will remain in effect (meaning this test can be used) for the duration of the COVID-19 declaration under Section 564(b)(1) of the Act, 21 U.S.C. section 360bbb-3(b)(1), unless the authorization is terminated or revoked.  Performed at [facility], [address], [place], [place] [address]   Blood Culture (routine x 2)     Status: Abnormal   Collection Time: 12/18/22  2:50 AM   Specimen: BLOOD  Result Value Ref Range Status   Specimen Description   Final    BLOOD RIGHT ANTECUBITAL Performed at [facility], [address] [place], [place] [address]    Special Requests   Final    Blood Culture adequate volume BOTTLES DRAWN AEROBIC AND ANAEROBIC Performed at [facility], [address] [place], [place] [address]    Culture  Setup Time   Final    GRAM POSITIVE COCCI ANAEROBIC BOTTLE ONLY CRITICAL RESULT CALLED TO, READ  BACK BY AND VERIFIED WITH: PHARMD JUSTIN LEGGE [id number] AT 0829 BY EC    Culture (A)  Final    STAPHYLOCOCCUS EPIDERMIDIS THE SIGNIFICANCE OF ISOLATING THIS ORGANISM FROM A SINGLE SET OF BLOOD CULTURES WHEN MULTIPLE SETS ARE DRAWN IS UNCERTAIN. PLEASE NOTIFY THE MICROBIOLOGY DEPARTMENT WITHIN ONE WEEK IF SPECIATION AND SENSITIVITIES ARE REQUIRED. Performed at [facility], [address], [place], [place] [address]    Report Status 12/21/2022 FINAL  Final  Blood Culture ID Panel (Reflexed)     Status: Abnormal   Collection Time: 12/18/22  2:50 AM  Result Value Ref Range Status   Enterococcus faecalis NOT DETECTED NOT DETECTED Final   Enterococcus Faecium NOT DETECTED NOT DETECTED Final   Listeria monocytogenes NOT DETECTED NOT DETECTED Final   Staphylococcus species DETECTED (A) NOT DETECTED Final    Comment: CRITICAL RESULT CALLED TO, READ BACK BY AND VERIFIED WITH: PHARMD JUSTIN LEGGE [id number] AT 0829 BY EC    Staphylococcus aureus (BCID) NOT DETECTED NOT DETECTED Final   Staphylococcus epidermidis DETECTED (A) NOT DETECTED Final    Comment: CRITICAL RESULT CALLED TO, READ BACK BY AND VERIFIED WITH: PHARMD JUSTIN LEGGE [id number] 0829 BY EC    Staphylococcus lugdunensis NOT DETECTED NOT DETECTED Final   Streptococcus species NOT DETECTED NOT DETECTED Final   Streptococcus agalactiae NOT DETECTED NOT DETECTED Final   Streptococcus pneumoniae NOT DETECTED NOT DETECTED Final   Streptococcus pyogenes NOT DETECTED NOT DETECTED Final   A.calcoaceticus-baumannii NOT DETECTED NOT DETECTED Final   Bacteroides fragilis NOT DETECTED NOT DETECTED Final   Enterobacterales NOT DETECTED NOT DETECTED Final   Enterobacter cloacae complex NOT DETECTED NOT DETECTED Final   Escherichia coli NOT DETECTED NOT DETECTED Final   Klebsiella aerogenes NOT DETECTED NOT DETECTED Final   Klebsiella oxytoca NOT DETECTED NOT DETECTED Final   Klebsiella pneumoniae NOT DETECTED NOT DETECTED Final   Proteus  species NOT DETECTED NOT DETECTED Final   Salmonella species NOT DETECTED NOT DETECTED Final   Serratia marcescens NOT DETECTED NOT DETECTED Final   Haemophilus influenzae NOT DETECTED NOT DETECTED Final   Neisseria meningitidis NOT DETECTED NOT DETECTED Final   Pseudomonas aeruginosa NOT DETECTED NOT DETECTED Final   Stenotrophomonas maltophilia NOT DETECTED NOT DETECTED Final   Candida albicans NOT DETECTED NOT DETECTED Final   Candida auris NOT DETECTED NOT DETECTED Final  Candida glabrata NOT DETECTED NOT DETECTED Final   Candida krusei NOT DETECTED NOT DETECTED Final   Candida parapsilosis NOT DETECTED NOT DETECTED Final   Candida tropicalis NOT DETECTED NOT DETECTED Final   Cryptococcus neoformans/gattii NOT DETECTED NOT DETECTED Final   Methicillin resistance mecA/C NOT DETECTED NOT DETECTED Final    Comment: Performed at [facility], [address] [place], [place] [address]  Blood Culture (routine x 2)     Status: None   Collection Time: 12/18/22  2:55 AM   Specimen: BLOOD  Result Value Ref Range Status   Specimen Description   Final    BLOOD LEFT ANTECUBITAL Performed at [facility], [address], [place], [place] [address]    Special Requests   Final    Blood Culture adequate volume BOTTLES DRAWN AEROBIC AND ANAEROBIC Performed at [facility], [address] [place], [place] [address]    Culture   Final    NO GROWTH 5 DAYS Performed at [facility], [address] [place], [place] [address]    Report Status 12/23/2022 FINAL  Final  Pleural fluid culture w Gram Stain     Status: None   Collection Time: 12/18/22  1:12 PM   Specimen: Pleural Fluid  Result Value Ref Range Status   Specimen Description   Final    PLEURAL Performed at [facility], [address], [place], [place] [address]    Special Requests   Final    Immunocompromised Performed at [facility], [address] [place], [place] [address]    Gram Stain   Final    FEW WBC PRESENT, PREDOMINANTLY MONONUCLEAR NO ORGANISMS SEEN    Culture   Final    NO GROWTH 3 DAYS Performed at [facility], [address] [place], [place] [address]    Report Status 12/21/2022 FINAL  Final  Acid Fast Smear (AFB)     Status: None   Collection Time: 12/18/22  1:12 PM   Specimen: Pleural Fluid  Result Value Ref Range Status   AFB Specimen Processing Concentration  Final   Acid Fast Smear Negative  Final    Comment: (NOTE) Performed At: [facility] [address] [place], [place] [id number] Jolene Schimke MD [id number]:[id number]    Source (AFB) PLEURAL  Final    Comment: Performed at [facility], [address], [place], [place] [address]  Blastomyces Antigen     Status: None   Collection Time: 12/19/22  9:04 AM  Result Value Ref Range Status   Blastomyces Antigen None Detected None Detected ng/mL Final    Comment: (NOTE) Reference Interval: None Detected Reportable Range: 0.31 ng/mL - 20.00 ng/mL Results above 20.00 ng/mL are reported as 'Positive, Above the Limit of Quantification' This test was developed and its performance characteristics determined by [employer]. It has not been cleared or approved by the FDA; however, FDA clearance or approval is not currently required for clinical use. The results are not intended to be used as the sole means for clinical diagnosis or patient decisions.    Specimen Type SERUM  Final    Comment: (NOTE) Performed At: [facility] [address] [place], [place] [id number] Roxanne Gates MD [id number]   Culture, Respiratory w Gram Stain     Status: None   Collection Time: 12/21/22  2:05 PM   Specimen: Bronchial Alveolar Lavage; Respiratory  Result Value Ref Range Status  Specimen Description   Final    BRONCHIAL ALVEOLAR LAVAGE LEFT UPPER LOBE Performed at [facility], [address] [place], [place] [address]    Special Requests   Final    BRONCHIAL ALVEOLAR LAVAGE LEFT UPPER LOBE Performed at [facility], [address], [place], [place] [address]    Gram Stain   Final    RARE WBC PRESENT, PREDOMINANTLY MONONUCLEAR NO ORGANISMS SEEN    Culture   Final    NO GROWTH 3 DAYS Performed at [facility], [address] [place], [place] [address]    Report Status 12/25/2022 FINAL  Final  Culture, Respiratory w Gram Stain     Status: None   Collection Time: 12/21/22  2:05 PM   Specimen: Bronchial Alveolar Lavage; Respiratory  Result Value Ref Range Status   Specimen Description   Final    BRONCHIAL ALVEOLAR LAVAGE LEFT LOWER LOBE Performed at [facility], [address] [place], [place] [address]    Special Requests   Final    BRONCHIAL ALVEOLAR LAVAGE LEFT LOWER LOBE Performed at [facility], [address], [place], [place] [address]    Gram Stain NO WBC SEEN NO ORGANISMS SEEN   Final   Culture   Final    NO GROWTH 3 DAYS Performed at [facility], [address], [place], [place] [address]    Report Status 12/25/2022 FINAL  Final  Acid Fast Smear (AFB)     Status: None   Collection Time: 12/21/22  2:05 PM   Specimen: Bronchial Alveolar Lavage; Respiratory  Result Value Ref Range Status   AFB Specimen Processing Concentration  Final   Acid Fast Smear Negative  Final    Comment: (NOTE) Performed At: [facility] [address] [place], [place] [id number] Jolene Schimke MD WN:[id number]    Source (AFB) BRONCHIAL ALVEOLAR LAVAGE  Final    Comment: LEFT UPPER LOBE Performed at [facility], [address] [place], [place] [address]   Acid Fast Smear (AFB)     Status: None   Collection Time: 12/21/22  2:05 PM   Specimen: Bronchial Alveolar Lavage; Respiratory  Result Value Ref Range Status   AFB Specimen Processing Concentration  Final   Acid  Fast Smear Negative  Final    Comment: (NOTE) Performed At: [facility] [address] [place], [place] [id number] Jolene Schimke MD [id number]    Source (AFB) BRONCHIAL ALVEOLAR LAVAGE  Final    Comment:  LEFT LOWER LOBE Performed at [facility], [address] [place], [place] [address]   Anaerobic culture w Gram Stain     Status: None (Preliminary result)   Collection Time: 12/21/22  2:05 PM   Specimen: Bronchoalveolar Lavage  Result Value Ref Range Status   Specimen Description BRONCHIAL ALVEOLAR LAVAGE  Final   Special Requests LEFT LOWER LOBE  Final   Gram Stain   Final    WBC PRESENT, PREDOMINANTLY MONONUCLEAR NO ORGANISMS SEEN CYTOSPIN SMEAR Performed at [facility], [address], [place], [place] [address]    Culture   Final    NO ANAEROBES ISOLATED; CULTURE IN PROGRESS FOR 5 DAYS   Report Status PENDING  Incomplete  Anaerobic culture w Gram Stain     Status: None (Preliminary result)   Collection Time: 12/21/22  2:05 PM   Specimen: Bronchoalveolar Lavage  Result Value Ref Range Status   Specimen Description  BRONCHIAL ALVEOLAR LAVAGE  Final   Special Requests LEFT UPPER LOBE  Final   Gram Stain   Final    WBC PRESENT, PREDOMINANTLY MONONUCLEAR NO ORGANISMS SEEN CYTOSPIN SMEAR Performed at [facility], [address], [place], [place] [address]    Culture   Final    NO ANAEROBES ISOLATED; CULTURE IN PROGRESS FOR 5 DAYS   Report Status PENDING  Incomplete  CSF culture w Gram Stain     Status: None   Collection Time: 12/22/22 12:34 PM   Specimen: CSF; Cerebrospinal Fluid  Result Value Ref Range Status   Specimen Description   Final    CSF Performed at [facility], [address], [place], [place] [address]    Special Requests   Final    Normal Performed at [facility], [address] [place], [place] [address]    Gram Stain NO WBC SEEN NO ORGANISMS SEEN CYTOSPIN  SMEAR   Final   Culture   Final    NO GROWTH 3 DAYS Performed at [facility], [address] [place], [place] [address]    Report Status 12/25/2022 FINAL  Final     Medications:    bictegravir-emtricitabine-tenofovir AF  1 tablet Oral Daily   dronabinol  5 mg Oral BID AC   enoxaparin (LOVENOX) injection  40 mg Subcutaneous Q24H   feeding supplement  237 mL Oral BID BM   fluconazole  200 mg Oral Daily   nicotine  7 mg Transdermal Daily   sulfamethoxazole-trimethoprim  1 tablet Oral Daily   Continuous Infusions:      LOS: 8 days   Marinda Elk  [facility]  12/26/2022, 9:54 AM

## 2022-12-26 NOTE — Plan of Care (Signed)

## 2022-12-26 NOTE — Progress Notes (Signed)
Unfortunately, the Port-A-Cath was not putting yesterday.  He will be put in today.  Hopefully, he still would be able to start his chemotherapy today.  He still has significant pain because of this mediastinal mass.  I think the only way for [place] to help his pain is to start his treatment.  I really appreciate IDs involvement.  I know that he is on Diflucan.  Apparently, with one of the biopsies that he had done, there appeared to be cryptococcal organisms noted in the biopsy.  He had malignant cells in left and right hilar lymph nodes.  There is no lab work yet today.  Again, I do not see a problem with him starting his chemotherapy today.  His vital signs show temperature of 98.  Pulse 75.  Blood pressure 138/95.  His head and neck exam showed no ocular or oral lesions.  He has no mucositis.  Lungs are clear bilaterally.  Cardiac exam regular rate and rhythm.  Abdomen is soft.  Bowel sounds are present.  There is no fluid wave.  Neurological exam shows no focal neurological deficits.  Again, we will go ahead with treatment.  Hopefully have treatment today after the Port-A-Cath is put in.   Christin Bach, MD  Romans 1`:16

## 2022-12-26 NOTE — Progress Notes (Signed)
Referring Physician(s): Ennever,P  Supervising Physician: Marliss Coots  Patient Status:  [facility] - In-pt  Chief Complaint:  Lung cancer  Subjective: Pt known to IR team from [facility] on 12/22/22. He is a 42 yo male smoker with PMH significant for HIV/AIDS, alcohol abuse, cryptococcal meningitis, GERD, protein calorie malnutrition, and newly diagnosed lung cancer.  Request now received for Port-A-Cath placement and bone marrow biopsy on pt. He denies fever, HA,worsening dyspnea, abd pain,N/V or bleeding. He does have occ cough, some left side/ant chest discomfort. He is anxious.    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   Thrombocytopenia (HCC) 06/2018   Past Surgical History:  Procedure Laterality Date   BIOPSY  12/21/2022   Procedure: BIOPSY;  Surgeon: Tomma Lightning, MD;  Location: [facility];  Service: Endoscopy;;   BRONCHIAL WASHINGS  12/21/2022   Procedure: BRONCHIAL WASHINGS;  Surgeon: Tomma Lightning, MD;  Location: [facility];  Service: Endoscopy;;   ENDOBRONCHIAL ULTRASOUND Left 12/21/2022   Procedure: ENDOBRONCHIAL ULTRASOUND;  Surgeon: Tomma Lightning, MD;  Location: [facility];  Service: Endoscopy;  Laterality: Left;  Endobronchial ultrasound, biopsy, need fluoroscopy   FINE NEEDLE ASPIRATION  12/21/2022   Procedure: FINE NEEDLE ASPIRATION (FNA) LINEAR;  Surgeon: Tomma Lightning, MD;  Location: [facility];  Service: Endoscopy;;   IR LUMBAR PUNCTURE  12/22/2022   LUMBAR PUNCTURE  06/2018   during eval for meningitis    VIDEO BRONCHOSCOPY Left 12/21/2022   Procedure: VIDEO BRONCHOSCOPY WITH FLUORO, endobronchial ultrasound;  Surgeon: Tomma Lightning, MD;  Location: [facility];  Service: Endoscopy;  Laterality: Left;  Schedule for 12/20/2022 12/21/2022      Allergies: Patient has no known allergies.  Medications: Prior to Admission medications   Not on File     Vital Signs: BP (!) 138/95 (BP Location: Left Arm)   Pulse 75   Temp 98 F (36.7 C) (Oral)   Resp 18   Ht 6' (1.829 m)   Wt 173 lb 14.4 oz (78.9 kg)   SpO2 99%   BMI 23.59 kg/m   Physical Exam; awake/alert; chest-dim BS left base, rt clear; heart- tachy but reg rhythm; abd- soft,+BS,NT; no LE edema  Imaging: MR BRAIN W WO CONTRAST  Result Date: 12/24/2022 CLINICAL DATA:  Non-small cell lung cancer staging EXAM: MRI HEAD WITHOUT AND WITH CONTRAST TECHNIQUE: Multiplanar, multiecho pulse sequences of the brain and surrounding structures were obtained without and with intravenous contrast. CONTRAST:  7mL GADAVIST GADOBUTROL 1 MMOL/ML IV SOLN COMPARISON:  Head CT from 4 days ago FINDINGS: Brain: No enhancement or swelling to suggest metastatic disease. FLAIR hyperintensity in the cerebral white matter especially affecting the frontal lobes and the left centrum semiovale, without involvement of subcortical U fibers, mass effect, or enhancement. No acute or subacute infarct. No hemorrhage, hydrocephalus, or mass. Vascular: Major flow voids are preserved Skull and upper cervical spine: Normal marrow signal Sinuses/Orbits: Negative IMPRESSION: 1. No enhancement to suggest metastatic disease. 2. 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 Electronically Signed   By: Tiburcio Pea M.D.   On: 12/24/2022 11:45   IR LUMBAR PUNCTURE  Result Date: 12/22/2022 CLINICAL DATA:  42 year old  male with history of cryptococcal meningitis. EXAM: DIAGNOSTIC LUMBAR PUNCTURE UNDER FLUOROSCOPIC GUIDANCE COMPARISON:  06/17/2018 FLUOROSCOPY TIME:  Three mGy PROCEDURE: Informed consent was obtained from the patient prior to the procedure, including potential complications  of headache, allergy, and pain. With the patient prone, the lower back was prepped with Betadine. 1% Lidocaine was used for local anesthesia. Lumbar puncture was performed at the L2-L3 level using a 3.5 inch, 20 gauge needle with return of clear CSF with an opening pressure of 21 cm water. A total of 16 ml of CSF were obtained for laboratory studies. The patient tolerated the procedure well and there were no apparent complications. IMPRESSION: Technically successful fluoroscopic guided lumbar puncture at L2-L3. Marliss Coots, MD [facility] Electronically Signed   By: Marliss Coots M.D.   On: 12/22/2022 15:42    Labs:  CBC: Recent Labs    12/21/22 1813 12/24/22 0944 12/25/22 0542 12/26/22 0832  WBC 10.2 6.6 7.0 7.9  HGB 17.2* 16.5 16.7 16.6  HCT 49.0 46.9 47.9 48.0  PLT 226 222 236 249    COAGS: Recent Labs    12/18/22 0226  INR 1.0  APTT 24    BMP: Recent Labs    12/19/22 0904 12/20/22 1002 12/21/22 1813 12/24/22 0944  NA 138 139 138 137  K 4.1 3.9 3.8 3.7  CL 105 102 102 103  CO2 23 27 25 24   GLUCOSE 107* 96 193* 97  BUN 6 9 11 12   CALCIUM 8.5* 9.0 9.1 9.0  CREATININE 1.04 1.16 1.37* 1.15  GFRNONAA >60 >60 >60 >60    LIVER FUNCTION TESTS: Recent Labs    12/12/22 0442 12/14/22 1132 12/18/22 0226 12/18/22 1312 12/19/22 0904 12/20/22 1002 12/21/22 1813 12/24/22 0944  BILITOT 0.4 0.5 1.0  --  1.0  --   --  0.5  AST 23 19 24   --  21  --   --  18  ALT 16 13 19   --  19  --   --  22  ALKPHOS 76  --  77  --  74  --   --  67  PROT 6.1* 6.5 6.9 6.7 6.6  --   --  7.0  ALBUMIN 3.3*  --  3.7  --  3.5 4.0 4.2 3.8    Assessment and Plan: 42 yo male smoker with PMH significant for HIV/AIDS, alcohol abuse, cryptococcal meningitis, GERD, protein calorie malnutrition, and newly diagnosed lung cancer.  Request now received for Port-A-Cath placement and bone marrow biopsy on pt.Risks and benefits of image guided  port-a-catheter placement/bone marrow biopsy was discussed with the patient including, but not limited to bleeding, infection, pneumothorax, or fibrin sheath development and need for additional procedures.  All of the patient's questions were answered, patient is agreeable to proceed. Consent signed and in chart. Pt afebrile, CBC nl. CSF cx neg  Port a cath placement scheduled for today; if schedule allows will also attempt to do BM bx today   Electronically Signed: D. Jeananne Rama, PA-C 12/26/2022, 9:53 AM   I spent a total of 25 Minutes at the the patient's bedside AND on the patient's hospital floor or unit, greater than 50% of which was counseling/coordinating care for port a cath placement and bone marrow biopsy    Patient ID: Curren Delon, male   DOB: December 18, 1980, 42 y.o.   MRN: [id number]

## 2022-12-26 NOTE — Progress Notes (Signed)
At beginning of shift, administered PRN pain medication ordered for 7/10 flank pain. At around 2130, patient c/o 10/10 flank pain. Notified J. Garner Nash, NP. New order for increased pain medication and administered to patient. Will continue to monitor.

## 2022-12-27 ENCOUNTER — Inpatient Hospital Stay (HOSPITAL_COMMUNITY): Payer: 59

## 2022-12-27 DIAGNOSIS — B20 Human immunodeficiency virus [HIV] disease: Secondary | ICD-10-CM | POA: Diagnosis not present

## 2022-12-27 DIAGNOSIS — B451 Cerebral cryptococcosis: Secondary | ICD-10-CM | POA: Diagnosis not present

## 2022-12-27 DIAGNOSIS — J9859 Other diseases of mediastinum, not elsewhere classified: Secondary | ICD-10-CM | POA: Diagnosis not present

## 2022-12-27 DIAGNOSIS — C3492 Malignant neoplasm of unspecified part of left bronchus or lung: Secondary | ICD-10-CM | POA: Diagnosis not present

## 2022-12-27 DIAGNOSIS — J90 Pleural effusion, not elsewhere classified: Secondary | ICD-10-CM | POA: Diagnosis not present

## 2022-12-27 DIAGNOSIS — F17210 Nicotine dependence, cigarettes, uncomplicated: Secondary | ICD-10-CM

## 2022-12-27 HISTORY — PX: IR IMAGING GUIDED PORT INSERTION: IMG5740

## 2022-12-27 HISTORY — PX: IR BONE MARROW BIOPSY & ASPIRATION: IMG5727

## 2022-12-27 MED ORDER — LIDOCAINE HCL (PF) 1 % IJ SOLN
INTRAMUSCULAR | Status: AC
  Filled 2022-12-27: qty 30

## 2022-12-27 MED ORDER — MIDAZOLAM HCL 2 MG/2ML IJ SOLN
INTRAMUSCULAR | Status: AC | PRN
  Administered 2022-12-27: .5 mg via INTRAVENOUS
  Administered 2022-12-27: 1 mg via INTRAVENOUS

## 2022-12-27 MED ORDER — SODIUM CHLORIDE 0.9 % IV SOLN: Freq: Once | INTRAVENOUS | Status: AC

## 2022-12-27 MED ORDER — ENOXAPARIN SODIUM 40 MG/0.4ML IJ SOSY
40.0000 mg | PREFILLED_SYRINGE | INTRAMUSCULAR | Status: DC
  Administered 2022-12-28 – 2023-01-03 (×6): 40 mg via SUBCUTANEOUS
  Filled 2022-12-27 (×7): qty 0.4

## 2022-12-27 MED ORDER — MIDAZOLAM HCL 2 MG/2ML IJ SOLN
INTRAMUSCULAR | Status: AC
  Filled 2022-12-27: qty 2

## 2022-12-27 MED ORDER — LIDOCAINE-EPINEPHRINE 1 %-1:100000 IJ SOLN
20.0000 mL | Freq: Once | INTRAMUSCULAR | Status: AC
  Administered 2022-12-27: 20 mL via INTRADERMAL
  Filled 2022-12-27: qty 20

## 2022-12-27 MED ORDER — SODIUM CHLORIDE 0.9 % IV SOLN
10.0000 mg | Freq: Once | INTRAVENOUS | Status: AC
  Administered 2022-12-27: 10 mg via INTRAVENOUS
  Filled 2022-12-27: qty 1

## 2022-12-27 MED ORDER — LIDOCAINE-EPINEPHRINE 1 %-1:100000 IJ SOLN
INTRAMUSCULAR | Status: AC
  Filled 2022-12-27: qty 1

## 2022-12-27 MED ORDER — SODIUM CHLORIDE 0.9 % IV SOLN
150.0000 mg | Freq: Once | INTRAVENOUS | Status: AC
  Administered 2022-12-27: 150 mg via INTRAVENOUS
  Filled 2022-12-27: qty 5

## 2022-12-27 MED ORDER — SODIUM CHLORIDE 0.9 % IV SOLN
100.0000 mg/m2 | Freq: Once | INTRAVENOUS | Status: AC
  Administered 2022-12-27: 200 mg via INTRAVENOUS
  Filled 2022-12-27: qty 10

## 2022-12-27 MED ORDER — FENTANYL CITRATE (PF) 100 MCG/2ML IJ SOLN
INTRAMUSCULAR | Status: AC
  Filled 2022-12-27: qty 2

## 2022-12-27 MED ORDER — HEPARIN SODIUM (PORCINE) 1000 UNIT/ML IJ SOLN
INTRAMUSCULAR | Status: AC
  Filled 2022-12-27: qty 10

## 2022-12-27 MED ORDER — SODIUM CHLORIDE 0.9 % IV SOLN
582.5000 mg | Freq: Once | INTRAVENOUS | Status: AC
  Administered 2022-12-27: 580 mg via INTRAVENOUS
  Filled 2022-12-27: qty 58

## 2022-12-27 MED ORDER — POLYETHYLENE GLYCOL 3350 17 G PO PACK
17.0000 g | PACK | Freq: Two times a day (BID) | ORAL | Status: AC
  Administered 2022-12-27 – 2022-12-28 (×3): 17 g via ORAL
  Filled 2022-12-27 (×4): qty 1

## 2022-12-27 MED ORDER — ONDANSETRON HCL 4 MG/2ML IJ SOLN
INTRAMUSCULAR | Status: AC
  Filled 2022-12-27: qty 2

## 2022-12-27 MED ORDER — LIDOCAINE HCL (PF) 1 % IJ SOLN
30.0000 mL | Freq: Once | INTRAMUSCULAR | Status: AC
  Administered 2022-12-27: 10 mL via INTRADERMAL

## 2022-12-27 MED ORDER — FENTANYL CITRATE (PF) 100 MCG/2ML IJ SOLN
INTRAMUSCULAR | Status: AC | PRN
Start: 2022-12-27 — End: 2022-12-27
  Administered 2022-12-27: 25 ug via INTRAVENOUS
  Administered 2022-12-27: 50 ug via INTRAVENOUS

## 2022-12-27 MED ORDER — LORAZEPAM 0.5 MG PO TABS
0.5000 mg | ORAL_TABLET | Freq: Two times a day (BID) | ORAL | Status: DC | PRN
  Administered 2022-12-30 – 2023-01-03 (×4): 0.5 mg via ORAL
  Filled 2022-12-27 (×4): qty 1

## 2022-12-27 MED ORDER — PALONOSETRON HCL INJECTION 0.25 MG/5ML
0.2500 mg | Freq: Once | INTRAVENOUS | Status: AC
  Administered 2022-12-27: 0.25 mg via INTRAVENOUS
  Filled 2022-12-27: qty 5

## 2022-12-27 MED ORDER — CHLORHEXIDINE GLUCONATE CLOTH 2 % EX PADS
6.0000 | MEDICATED_PAD | Freq: Every day | CUTANEOUS | Status: DC
  Administered 2022-12-27 – 2023-01-02 (×5): 6 via TOPICAL

## 2022-12-27 MED ORDER — FENTANYL 25 MCG/HR TD PT72
1.0000 | MEDICATED_PATCH | TRANSDERMAL | Status: DC
  Administered 2022-12-27 – 2022-12-30 (×2): 1 via TRANSDERMAL
  Filled 2022-12-27 (×2): qty 1

## 2022-12-27 NOTE — Plan of Care (Signed)

## 2022-12-27 NOTE — Progress Notes (Signed)
Subjective:  No new complaints he was a bit delirious in the context of sedatives that been given for his bone marrow biopsy and port placement  Antibiotics:  Anti-infectives (From admission, onward)    Start     Dose/Rate Route Frequency Ordered Stop   12/25/22 1145  sulfamethoxazole-trimethoprim (BACTRIM DS) 800-160 MG per tablet 1 tablet        1 tablet Oral Daily 12/25/22 1057     12/24/22 1000  Ampicillin-Sulbactam (UNASYN) 3 g in sodium chloride 0.9 % 100 mL IVPB        3 g 200 mL/hr over 30 Minutes Intravenous Every 6 hours 12/24/22 0929 12/25/22 1900   12/22/22 2030  fluconazole (DIFLUCAN) tablet 200 mg        200 mg Oral Daily 12/22/22 1915     12/18/22 2300  bictegravir-emtricitabine-tenofovir AF (BIKTARVY) 50-200-25 MG per tablet 1 tablet        1 tablet Oral Daily 12/18/22 1910     12/18/22 1100  Ampicillin-Sulbactam (UNASYN) 3 g in sodium chloride 0.9 % 100 mL IVPB  Status:  Discontinued        3 g 200 mL/hr over 30 Minutes Intravenous Every 6 hours 12/18/22 1054 12/24/22 0929   12/18/22 0500  sulfamethoxazole-trimethoprim (BACTRIM) 415 mg of trimethoprim in dextrose 5 % 500 mL IVPB  Status:  Discontinued        415 mg of trimethoprim 350.6 mL/hr over 90 Minutes Intravenous Every 6 hours 12/18/22 0427 12/18/22 1051       Medications: Scheduled Meds:  bictegravir-emtricitabine-tenofovir AF  1 tablet Oral Daily   CARBOplatin  580 mg Intravenous Once   Chlorhexidine Gluconate Cloth  6 each Topical Daily   dronabinol  5 mg Oral BID AC   [START ON 12/28/2022] enoxaparin (LOVENOX) injection  40 mg Subcutaneous Q24H   etoposide  100 mg/m2 (Treatment Plan Recorded) Intravenous Once   feeding supplement  237 mL Oral BID BM   fentaNYL  1 patch Transdermal Q72H   fluconazole  200 mg Oral Daily   palonosetron  0.25 mg Intravenous Once   polyethylene glycol  17 g Oral BID   sulfamethoxazole-trimethoprim  1 tablet Oral Daily   Continuous Infusions:  sodium  chloride     dexamethasone (DECADRON) IVPB ([facility])     fosaprepitant (EMEND) 150 mg in sodium chloride 0.9 % 145 mL IVPB     PRN Meds:.alum & mag hydroxide-simeth, ipratropium-albuterol, LORazepam, ondansetron **OR** ondansetron (ZOFRAN) IV, mouth rinse, oxyCODONE    Objective: Weight change:  No intake or output data in the 24 hours ending 12/27/22 1350  Blood pressure (!) 148/103, pulse 86, temperature 98 F (36.7 C), temperature source Oral, resp. rate 18, height 6' (1.829 m), weight 78.9 kg, SpO2 92%. Temp:  [98 F (36.7 C)-98.9 F (37.2 C)] 98 F (36.7 C) (09/18 1128) Pulse Rate:  [68-109] 86 (09/18 1128) Resp:  [12-28] 18 (09/18 1128) BP: (75-154)/(49-103) 148/103 (09/18 1128) SpO2:  [86 %-99 %] 92 % (09/18 1128)  Physical Exam: Physical Exam Constitutional:      Appearance: He is well-developed.  HENT:     Head: Normocephalic and atraumatic.  Eyes:     Conjunctiva/sclera: Conjunctivae normal.  Cardiovascular:     Rate and Rhythm: Normal rate and regular rhythm.  Pulmonary:     Effort: Pulmonary effort is normal.     Breath sounds: No wheezing.  Abdominal:     General: There is no distension.  Palpations: Abdomen is soft.  Musculoskeletal:        General: Normal range of motion.     Cervical back: Normal range of motion and neck supple.  Skin:    General: Skin is warm and dry.     Findings: No erythema or rash.  Neurological:     General: No focal deficit present.     Mental Status: He is alert and oriented to person, place, and time.  Psychiatric:        Attention and Perception: He is inattentive.        Mood and Affect: Mood normal.        Speech: Speech is slurred.        Behavior: Behavior is slowed.        Thought Content: Thought content normal.        Cognition and Memory: Cognition is impaired.        Judgment: Judgment normal.      CBC:    BMET Recent Labs    12/26/22 0832  NA 138  K 3.9  CL 104  CO2 25  GLUCOSE 89  BUN 13   CREATININE 1.15  CALCIUM 9.2     Liver Panel  Recent Labs    12/26/22 0832  PROT 7.1  ALBUMIN 4.0  AST 22  ALT 32  ALKPHOS 71  BILITOT 0.7       Sedimentation Rate No results for input(s): "ESRSEDRATE" in the last 72 hours. C-Reactive Protein No results for input(s): "CRP" in the last 72 hours.  Micro Results: Recent Results (from the past 720 hour(s))  Resp panel by RT-PCR (RSV, Flu A&B, Covid) Anterior Nasal Swab     Status: None   Collection Time: 12/18/22  2:35 AM   Specimen: Anterior Nasal Swab  Result Value Ref Range Status   SARS Coronavirus 2 by RT PCR NEGATIVE NEGATIVE Final    Comment: (NOTE) SARS-CoV-2 target nucleic acids are NOT DETECTED.  The SARS-CoV-2 RNA is generally detectable in upper respiratory specimens during the acute phase of infection. The lowest concentration of SARS-CoV-2 viral copies this assay can detect is 138 copies/mL. A negative result does not preclude SARS-Cov-2 infection and should not be used as the sole basis for treatment or other patient management decisions. A negative result may occur with  improper specimen collection/handling, submission of specimen other than nasopharyngeal swab, presence of viral mutation(s) within the areas targeted by this assay, and inadequate number of viral copies(<138 copies/mL). A negative result must be combined with clinical observations, patient history, and epidemiological information. The expected result is Negative.  Fact Sheet for Patients:  BloggerCourse.com  Fact Sheet for Healthcare Providers:  SeriousBroker.it  This test is no t yet approved or cleared by the [place] FDA and  has been authorized for detection and/or diagnosis of SARS-CoV-2 by FDA under an Emergency Use Authorization (EUA). This EUA will remain  in effect (meaning this test can be used) for the duration of the COVID-19 declaration under Section 564(b)(1)  of the Act, 21 U.S.C.section 360bbb-3(b)(1), unless the authorization is terminated  or revoked sooner.       Influenza A by PCR NEGATIVE NEGATIVE Final   Influenza B by PCR NEGATIVE NEGATIVE Final    Comment: (NOTE) The Xpert Xpress SARS-CoV-2/FLU/RSV plus assay is intended as an aid in the diagnosis of influenza from Nasopharyngeal swab specimens and should not be used as a sole basis for treatment. Nasal washings and aspirates are unacceptable for Xpert  Xpress SARS-CoV-2/FLU/RSV testing.  Fact Sheet for Patients: BloggerCourse.com  Fact Sheet for Healthcare Providers: SeriousBroker.it  This test is not yet approved or cleared by the [place] FDA and has been authorized for detection and/or diagnosis of SARS-CoV-2 by FDA under an Emergency Use Authorization (EUA). This EUA will remain in effect (meaning this test can be used) for the duration of the COVID-19 declaration under Section 564(b)(1) of the Act, 21 U.S.C. section 360bbb-3(b)(1), unless the authorization is terminated or revoked.     Resp Syncytial Virus by PCR NEGATIVE NEGATIVE Final    Comment: (NOTE) Fact Sheet for Patients: BloggerCourse.com  Fact Sheet for Healthcare Providers: SeriousBroker.it  This test is not yet approved or cleared by the [place] FDA and has been authorized for detection and/or diagnosis of SARS-CoV-2 by FDA under an Emergency Use Authorization (EUA). This EUA will remain in effect (meaning this test can be used) for the duration of the COVID-19 declaration under Section 564(b)(1) of the Act, 21 U.S.C. section 360bbb-3(b)(1), unless the authorization is terminated or revoked.  Performed at [facility], [address], [place], [place] [address]   Blood Culture (routine x 2)     Status: Abnormal   Collection Time: 12/18/22  2:50 AM   Specimen: BLOOD   Result Value Ref Range Status   Specimen Description   Final    BLOOD RIGHT ANTECUBITAL Performed at [facility], [address], [place], [place] [address]    Special Requests   Final    Blood Culture adequate volume BOTTLES DRAWN AEROBIC AND ANAEROBIC Performed at [facility], [address], [place], [place] [address]    Culture  Setup Time   Final    GRAM POSITIVE COCCI ANAEROBIC BOTTLE ONLY CRITICAL RESULT CALLED TO, READ BACK BY AND VERIFIED WITH: PHARMD JUSTIN LEGGE [id number] AT 0829 BY EC    Culture (A)  Final    STAPHYLOCOCCUS EPIDERMIDIS THE SIGNIFICANCE OF ISOLATING THIS ORGANISM FROM A SINGLE SET OF BLOOD CULTURES WHEN MULTIPLE SETS ARE DRAWN IS UNCERTAIN. PLEASE NOTIFY THE MICROBIOLOGY DEPARTMENT WITHIN ONE WEEK IF SPECIATION AND SENSITIVITIES ARE REQUIRED. Performed at [facility], [address] [place], [place] [address]    Report Status 12/21/2022 FINAL  Final  Blood Culture ID Panel (Reflexed)     Status: Abnormal   Collection Time: 12/18/22  2:50 AM  Result Value Ref Range Status   Enterococcus faecalis NOT DETECTED NOT DETECTED Final   Enterococcus Faecium NOT DETECTED NOT DETECTED Final   Listeria monocytogenes NOT DETECTED NOT DETECTED Final   Staphylococcus species DETECTED (A) NOT DETECTED Final    Comment: CRITICAL RESULT CALLED TO, READ BACK BY AND VERIFIED WITH: PHARMD JUSTIN LEGGE [id number] AT 0829 BY EC    Staphylococcus aureus (BCID) NOT DETECTED NOT DETECTED Final   Staphylococcus epidermidis DETECTED (A) NOT DETECTED Final    Comment: CRITICAL RESULT CALLED TO, READ BACK BY AND VERIFIED WITH: PHARMD JUSTIN LEGGE [id number] 0829 BY EC    Staphylococcus lugdunensis NOT DETECTED NOT DETECTED Final   Streptococcus species NOT DETECTED NOT DETECTED Final   Streptococcus agalactiae NOT DETECTED NOT DETECTED Final   Streptococcus pneumoniae NOT DETECTED NOT DETECTED Final   Streptococcus pyogenes NOT  DETECTED NOT DETECTED Final   A.calcoaceticus-baumannii NOT DETECTED NOT DETECTED Final   Bacteroides fragilis NOT DETECTED NOT DETECTED Final   Enterobacterales NOT DETECTED NOT DETECTED Final   Enterobacter cloacae complex NOT DETECTED NOT DETECTED Final  Escherichia coli NOT DETECTED NOT DETECTED Final   Klebsiella aerogenes NOT DETECTED NOT DETECTED Final   Klebsiella oxytoca NOT DETECTED NOT DETECTED Final   Klebsiella pneumoniae NOT DETECTED NOT DETECTED Final   Proteus species NOT DETECTED NOT DETECTED Final   Salmonella species NOT DETECTED NOT DETECTED Final   Serratia marcescens NOT DETECTED NOT DETECTED Final   Haemophilus influenzae NOT DETECTED NOT DETECTED Final   Neisseria meningitidis NOT DETECTED NOT DETECTED Final   Pseudomonas aeruginosa NOT DETECTED NOT DETECTED Final   Stenotrophomonas maltophilia NOT DETECTED NOT DETECTED Final   Candida albicans NOT DETECTED NOT DETECTED Final   Candida auris NOT DETECTED NOT DETECTED Final   Candida glabrata NOT DETECTED NOT DETECTED Final   Candida krusei NOT DETECTED NOT DETECTED Final   Candida parapsilosis NOT DETECTED NOT DETECTED Final   Candida tropicalis NOT DETECTED NOT DETECTED Final   Cryptococcus neoformans/gattii NOT DETECTED NOT DETECTED Final   Methicillin resistance mecA/C NOT DETECTED NOT DETECTED Final    Comment: Performed at [facility], [address] [place], [place] [address]  Blood Culture (routine x 2)     Status: None   Collection Time: 12/18/22  2:55 AM   Specimen: BLOOD  Result Value Ref Range Status   Specimen Description   Final    BLOOD LEFT ANTECUBITAL Performed at [facility], [address], [place], [place] [address]    Special Requests   Final    Blood Culture adequate volume BOTTLES DRAWN AEROBIC AND ANAEROBIC Performed at [facility], [address], [place], [place] [address]    Culture   Final    NO GROWTH 5 DAYS Performed at [facility], [address] [place], [place] [address]    Report Status 12/23/2022 FINAL  Final  Pleural fluid culture w Gram Stain     Status: None   Collection Time: 12/18/22  1:12 PM   Specimen: Pleural Fluid  Result Value Ref Range Status   Specimen Description   Final    PLEURAL Performed at [facility], [address], [place], [place] [address]    Special Requests   Final    Immunocompromised Performed at [facility], [address], [place], [place] [address]    Gram Stain   Final    FEW WBC PRESENT, PREDOMINANTLY MONONUCLEAR NO ORGANISMS SEEN    Culture   Final    NO GROWTH 3 DAYS Performed at [facility], [address] [place], [place] [address]    Report Status 12/21/2022 FINAL  Final  Acid Fast Smear (AFB)     Status: None   Collection Time: 12/18/22  1:12 PM   Specimen: Pleural Fluid  Result Value Ref Range Status   AFB Specimen Processing Concentration  Final   Acid Fast Smear Negative  Final    Comment: (NOTE) Performed At: [facility] [address] [place], [place] [id number] Jolene Schimke MD VQ:[id number]    Source (AFB) PLEURAL  Final    Comment: Performed at [facility], [address], [place], [place] [address]  Blastomyces Antigen     Status: None   Collection Time: 12/19/22  9:04 AM  Result Value Ref Range Status   Blastomyces Antigen None Detected None Detected ng/mL Final    Comment: (NOTE) Reference Interval: None Detected Reportable Range: 0.31 ng/mL - 20.00 ng/mL Results above 20.00 ng/mL are reported as 'Positive, Above the Limit  of Quantification' This test was developed and its performance characteristics determined by [employer]. It has not been cleared or approved by the FDA; however, FDA clearance or approval is not currently required for clinical use. The results are not intended to be used as the sole means for clinical diagnosis or  patient decisions.    Specimen Type SERUM  Final    Comment: (NOTE) Performed At: [facility] [address] [place], [place] [id number] Roxanne Gates MD JX:[id number]   Culture, Respiratory w Gram Stain     Status: None   Collection Time: 12/21/22  2:05 PM   Specimen: Bronchial Alveolar Lavage; Respiratory  Result Value Ref Range Status   Specimen Description   Final    BRONCHIAL ALVEOLAR LAVAGE LEFT UPPER LOBE Performed at [facility], [address], [place], [place] [address]    Special Requests   Final    BRONCHIAL ALVEOLAR LAVAGE LEFT UPPER LOBE Performed at [facility], [address], [place], [place] [address]    Gram Stain   Final    RARE WBC PRESENT, PREDOMINANTLY MONONUCLEAR NO ORGANISMS SEEN    Culture   Final    NO GROWTH 3 DAYS Performed at [facility], [address], [place], [place] [address]    Report Status 12/25/2022 FINAL  Final  Culture, Respiratory w Gram Stain     Status: None   Collection Time: 12/21/22  2:05 PM   Specimen: Bronchial Alveolar Lavage; Respiratory  Result Value Ref Range Status   Specimen Description   Final    BRONCHIAL ALVEOLAR LAVAGE LEFT LOWER LOBE Performed at [facility], [address], [place], [place] [address]    Special Requests   Final    BRONCHIAL ALVEOLAR LAVAGE LEFT LOWER LOBE Performed at [facility], [address], [place], [place] [address]    Gram Stain NO WBC SEEN NO ORGANISMS SEEN   Final   Culture   Final    NO GROWTH 3 DAYS Performed at [facility], [address], [place], [place] [address]    Report Status 12/25/2022 FINAL  Final  Acid Fast Smear (AFB)     Status: None   Collection Time: 12/21/22  2:05 PM   Specimen: Bronchial Alveolar Lavage; Respiratory  Result Value Ref Range Status   AFB Specimen Processing Concentration  Final   Acid Fast Smear Negative  Final    Comment:  (NOTE) Performed At: [facility] [address] [place], [place] [id number] Jolene Schimke MD QQ:[id number]    Source (AFB) BRONCHIAL ALVEOLAR LAVAGE  Final    Comment: LEFT UPPER LOBE Performed at [facility], [address], [place], [place] [address]   Acid Fast Smear (AFB)     Status: None   Collection Time: 12/21/22  2:05 PM   Specimen: Bronchial Alveolar Lavage; Respiratory  Result Value Ref Range Status   AFB Specimen Processing Concentration  Final   Acid Fast Smear Negative  Final    Comment: (NOTE) Performed At: [facility] [address] [place], [place] [id number] Jolene Schimke MD [id number]    Source (AFB) BRONCHIAL ALVEOLAR LAVAGE  Final    Comment:  LEFT LOWER LOBE Performed at [facility], [address] [place], [place] [address]   Anaerobic culture w Gram Stain     Status: None   Collection Time: 12/21/22  2:05 PM   Specimen: Bronchoalveolar Lavage  Result  Value Ref Range Status   Specimen Description BRONCHIAL ALVEOLAR LAVAGE  Final   Special Requests LEFT LOWER LOBE  Final   Gram Stain   Final    WBC PRESENT, PREDOMINANTLY MONONUCLEAR NO ORGANISMS SEEN CYTOSPIN SMEAR    Culture   Final    NO ANAEROBES ISOLATED Performed at [facility], [address] [place], [place] [address]    Report Status 12/26/2022 FINAL  Final  Anaerobic culture w Gram Stain     Status: None   Collection Time: 12/21/22  2:05 PM   Specimen: Bronchoalveolar Lavage  Result Value Ref Range Status   Specimen Description BRONCHIAL ALVEOLAR LAVAGE  Final   Special Requests LEFT UPPER LOBE  Final   Gram Stain   Final    WBC PRESENT, PREDOMINANTLY MONONUCLEAR NO ORGANISMS SEEN CYTOSPIN SMEAR    Culture   Final    NO ANAEROBES ISOLATED Performed at [facility], [address] [place], [place] [address]    Report Status 12/26/2022 FINAL  Final  CSF culture w Gram Stain     Status: None   Collection  Time: 12/22/22 12:34 PM   Specimen: CSF; Cerebrospinal Fluid  Result Value Ref Range Status   Specimen Description   Final    CSF Performed at [facility], [address] [place], [place] [address]    Special Requests   Final    Normal Performed at [facility], [address], [place], [place] [address]    Gram Stain NO WBC SEEN NO ORGANISMS SEEN CYTOSPIN SMEAR   Final   Culture   Final    NO GROWTH 3 DAYS Performed at [facility], [address], [place], [place] [address]    Report Status 12/25/2022 FINAL  Final    Studies/Results: No results found.    Assessment/Plan:  INTERVAL HISTORY:  Status bone marrow biopsy and port placement.         Principal Problem:   Small cell lung cancer, left (HCC) Active Problems:   HIV (human immunodeficiency virus infection) (HCC)   Pleural effusion on left   Hypokalemia   Alcoholism (HCC)   Smoker   Cryptococcal meningitis (HCC)    Christopher Henson is a 42 y.o. 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  #1 Non snall cell lung cancer  Port has been placed bone marrow biopsy obtained and he is starting on chemotherapy shortly  #2 HIV disease:  Viral load was reasonably suppressed when last checked and I sent another viral load yesterday CD4 count is risen to 356  The BIKTARVY should be fine in the face of 1 84V mutation and more than sufficient to take care of this virus   Continue Bactrim in anticipation he will be immunosuppressed with chemotherapy  Continue fluconazole to prevent recurrence of his cryptococcal disease w similar rationale  When he nears discharge should be should be provided with a 30-day supply of Biktarvy Bactrim and fluconazole   Christopher Henson has an appointment on 01/24/2023 at 1030 AM with Dr. [name] at  [facility], which  is located in the  [facility] at  [address] in [place].  [address], which is located to the left of the elevators.  Phone: [phone]  Fax: [phone]  [email or web address]  The patient should arrive 30 minutes prior to their appoitment.  I have personally spent 50 minutes involved in face-to-face and non-face-to-face activities for this patient on the day of the visit. Professional time spent includes the following activities: Preparing to see the patient (review of tests), Obtaining and/or reviewing separately obtained history (admission/discharge record), Performing a medically appropriate examination and/or evaluation , Ordering medications/tests/procedures, referring and communicating with other health care professionals, Documenting clinical information in the EMR, Independently interpreting results (not separately reported), Communicating results to the patient/family/caregiver, Counseling and educating the patient/family/caregiver and Care coordination (not separately reported).   I will sign off for now please call with further questions.   LOS: 9 days   Acey Lav 12/27/2022, 1:50 PM

## 2022-12-27 NOTE — Progress Notes (Signed)
[facility] PROGRESS NOTE    Progress Note  Christopher Henson  WGN:[id number] DOB: April 13, 1980 DOA: 12/18/2022 PCP: [name], PA-C     Brief Narrative:   Christopher Henson is an 42 y.o. 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 CT angio showed no PE but showed an 8 x 7 anterior mediastinal mass in the left hilar region with mass effect on the left upper lobe pulmonary artery, left upper lobe pulmonary nodule 1.6 x 1.2 cm and small left increased size pleural effusion . IR consulted for thoracentesis.  PCCM consulted and bronchoscopy done 12/21/2022.  ID was consulted he completed 7-day course of IV antibiotics  Significant events: 12/17/2024 CT scan of the abdomen pelvis showed no metastatic disease in the abdomen and pelvis. Findings 12/21/2022 thoracocentesis 800 cc of fluid cytology showed malignant cells consistent with small cell lung cancer 12/21/2022 bronchoscopy biopsy consistent with malignancies and organism morphology consistent with cryptococcus identified 12/21/2020 for lumbar puncture ultrasound-guided showed cryptococcal antigen negative 12/25/2022 brain MRI showed no acute findings no metastatic disease. 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 Completed course of antibiotics  Status post bronchoscopy with biopsy by pulmonary.   ID was consulted and recommended to complete course of IV Unasyn. Oncology was consulted recommended MRI brain that showed no acute findings and to start chemotherapy as an inpatient.  Status post Port-A-Cath and bone marrow biopsy on 12/27/2022, oncology wants to start chemotherapy this afternoon.   Oncology wants to start chemo as an  inpatient. Check CXR  HIV: With a CD4 count of 258. Noncompliance with his medication. ID was consulted recommended to [facility]. He was also started diflucan. ID also started Bactrim for PCP prophylaxis as a CD4 count is likely to drop.  Serum positive cryptococcal antigen: ID recommended fluoroscopy guided lumbar puncture, opening pressure was 21 cm of water.No Signs of cryptococcal infection. Because his count was still high ID recommended to continue to treat with Diflucan.  And continue as an outpatient until he sees them. Has an appointment with ID on 01/24/2023.  Anxiety: Start Ativan as needed twice a day.  Tobacco abuse: Nicotine patch was described.  Alcohol use: No signs of withdrawal continue thiamine and folate.  Noncompliance: Has been counseled.  Hypokalemia: Repleted now resolved.  Blood cultures positive : For Staph epidermidis 1 out of 4 likely contaminant.   DVT prophylaxis: none Family Communication:none Status is: Inpatient Remains inpatient appropriate because: Mediastinal mass    Code Status:     Code Status Orders  (From admission, onward)           Start     Ordered   12/18/22 0828  Full code  Continuous       Question:  By:  Answer:  Consent: discussion documented in EHR   12/18/22 0828           Code Status History     Date Active Date Inactive Code Status Order ID Comments User Context   06/08/2018 0855 06/21/2018 1833 Full Code [id number]  Rolly Salter, MD Inpatient         IV Access:   Peripheral IV   Procedures and diagnostic studies:   No results found.   Medical Consultants:   None.  Subjective:    Christopher Henson no complaints  Objective:    Vitals:   12/27/22 1010 12/27/22 1015 12/27/22 1020 12/27/22 1025  BP: (!) 75/49 130/84 99/76 (!) 115/99  Pulse:  70  76  Resp: (!) 21 18 16 15   Temp:      TempSrc:      SpO2:  (!) 86% 94% 94%  Weight:      Height:       SpO2: 94 % O2 Flow Rate  (L/min): 2 L/min  No intake or output data in the 24 hours ending 12/27/22 1029  Filed Weights   12/18/22 2115  Weight: 78.9 kg    Exam: General exam: In no acute distress. Respiratory system: Good air movement and clear to auscultation. Cardiovascular system: S1 & S2 heard, RRR. No JVD. Gastrointestinal system: Abdomen is nondistended, soft and nontender.  Extremities: No pedal edema. Skin: No rashes, lesions or ulcers Psychiatry: Judgement and insight appear normal. Mood & affect appropriate. Data Reviewed:    Labs: Basic Metabolic Panel: Recent Labs  Lab 12/21/22 1813 12/24/22 0944 12/26/22 0832  NA 138 137 138  K 3.8 3.7 3.9  CL 102 103 104  CO2 25 24 25   GLUCOSE 193* 97 89  BUN 11 12 13   CREATININE 1.37* 1.15 1.15  CALCIUM 9.1 9.0 9.2  MG 2.4  --   --   PHOS 2.2*  --   --    GFR Estimated Creatinine Clearance: 90 mL/min (by C-G formula based on SCr of 1.15 mg/dL). Liver Function Tests: Recent Labs  Lab 12/21/22 1813 12/24/22 0944 12/26/22 0832  AST  --  18 22  ALT  --  22 32  ALKPHOS  --  67 71  BILITOT  --  0.5 0.7  PROT  --  7.0 7.1  ALBUMIN 4.2 3.8 4.0   No results for input(s): "LIPASE", "AMYLASE" in the last 168 hours. No results for input(s): "AMMONIA" in the last 168 hours. Coagulation profile No results for input(s): "INR", "PROTIME" in the last 168 hours.  COVID-19 Labs  Recent Labs    12/26/22 0832  LDH 684*     Lab Results  Component Value Date   SARSCOV2NAA NEGATIVE 12/18/2022   SARSCOV2NAA Not Detected 12/12/2019    CBC: Recent Labs  Lab 12/21/22 1813 12/24/22 0944 12/25/22 0542 12/26/22 0832  WBC 10.2 6.6 7.0 7.9  NEUTROABS  --  3.7 3.8 4.6  HGB 17.2* 16.5 16.7 16.6  HCT 49.0 46.9 47.9 48.0  MCV 90.7 90.2 92.1 90.1  PLT 226 222 236 249   Cardiac Enzymes: No results for input(s): "CKTOTAL", "CKMB", "CKMBINDEX", "TROPONINI" in the last 168 hours. BNP (last 3 results) No results for input(s): "PROBNP" in the  last 8760 hours. CBG: No results for input(s): "GLUCAP" in the last 168 hours. D-Dimer: No results for input(s): "DDIMER" in the last 72 hours. Hgb A1c: No results for input(s): "HGBA1C" in the last 72 hours. Lipid Profile: No results for input(s): "CHOL", "HDL", "LDLCALC", "TRIG", "CHOLHDL", "LDLDIRECT" in the last 72 hours.  Thyroid function studies: No results for input(s): "TSH", "T4TOTAL", "T3FREE", "THYROIDAB" in the last 72 hours.  Invalid input(s): "FREET3" Anemia work up: No results for input(s): "VITAMINB12", "FOLATE", "FERRITIN", "TIBC", "IRON", "RETICCTPCT" in the last 72 hours. Sepsis Labs: Recent Labs  Lab 12/21/22 1813 12/24/22 0944 12/25/22 0542 12/26/22 0832  WBC 10.2 6.6 7.0 7.9   Microbiology Recent Results (from the past 240 hour(s))  Resp panel by RT-PCR (RSV, Flu  A&B, Covid) Anterior Nasal Swab     Status: None   Collection Time: 12/18/22  2:35 AM   Specimen: Anterior Nasal Swab  Result Value Ref Range Status   SARS Coronavirus 2 by RT PCR NEGATIVE NEGATIVE Final    Comment: (NOTE) SARS-CoV-2 target nucleic acids are NOT DETECTED.  The SARS-CoV-2 RNA is generally detectable in upper respiratory specimens during the acute phase of infection. The lowest concentration of SARS-CoV-2 viral copies this assay can detect is 138 copies/mL. A negative result does not preclude SARS-Cov-2 infection and should not be used as the sole basis for treatment or other patient management decisions. A negative result may occur with  improper specimen collection/handling, submission of specimen other than nasopharyngeal swab, presence of viral mutation(s) within the areas targeted by this assay, and inadequate number of viral copies(<138 copies/mL). A negative result must be combined with clinical observations, patient history, and epidemiological information. The expected result is Negative.  Fact Sheet for Patients:  BloggerCourse.com  Fact  Sheet for Healthcare Providers:  SeriousBroker.it  This test is no t yet approved or cleared by the [place] FDA and  has been authorized for detection and/or diagnosis of SARS-CoV-2 by FDA under an Emergency Use Authorization (EUA). This EUA will remain  in effect (meaning this test can be used) for the duration of the COVID-19 declaration under Section 564(b)(1) of the Act, 21 U.S.C.section 360bbb-3(b)(1), unless the authorization is terminated  or revoked sooner.       Influenza A by PCR NEGATIVE NEGATIVE Final   Influenza B by PCR NEGATIVE NEGATIVE Final    Comment: (NOTE) The Xpert Xpress SARS-CoV-2/FLU/RSV plus assay is intended as an aid in the diagnosis of influenza from Nasopharyngeal swab specimens and should not be used as a sole basis for treatment. Nasal washings and aspirates are unacceptable for Xpert Xpress SARS-CoV-2/FLU/RSV testing.  Fact Sheet for Patients: BloggerCourse.com  Fact Sheet for Healthcare Providers: SeriousBroker.it  This test is not yet approved or cleared by the [place] FDA and has been authorized for detection and/or diagnosis of SARS-CoV-2 by FDA under an Emergency Use Authorization (EUA). This EUA will remain in effect (meaning this test can be used) for the duration of the COVID-19 declaration under Section 564(b)(1) of the Act, 21 U.S.C. section 360bbb-3(b)(1), unless the authorization is terminated or revoked.     Resp Syncytial Virus by PCR NEGATIVE NEGATIVE Final    Comment: (NOTE) Fact Sheet for Patients: BloggerCourse.com  Fact Sheet for Healthcare Providers: SeriousBroker.it  This test is not yet approved or cleared by the [place] FDA and has been authorized for detection and/or diagnosis of SARS-CoV-2 by FDA under an Emergency Use Authorization (EUA). This EUA will remain in effect  (meaning this test can be used) for the duration of the COVID-19 declaration under Section 564(b)(1) of the Act, 21 U.S.C. section 360bbb-3(b)(1), unless the authorization is terminated or revoked.  Performed at [facility], [address], [place], [place] [address]   Blood Culture (routine x 2)     Status: Abnormal   Collection Time: 12/18/22  2:50 AM   Specimen: BLOOD  Result Value Ref Range Status   Specimen Description   Final    BLOOD RIGHT ANTECUBITAL Performed at [facility], [address] [place], [place] [address]    Special Requests   Final    Blood Culture adequate volume BOTTLES DRAWN AEROBIC AND ANAEROBIC Performed at [facility] [place], [address]  [place], [place] [address]    Culture  Setup Time   Final    GRAM POSITIVE COCCI ANAEROBIC BOTTLE ONLY CRITICAL RESULT CALLED TO, READ BACK BY AND VERIFIED WITH: PHARMD JUSTIN LEGGE [id number] AT 0829 BY EC    Culture (A)  Final    STAPHYLOCOCCUS EPIDERMIDIS THE SIGNIFICANCE OF ISOLATING THIS ORGANISM FROM A SINGLE SET OF BLOOD CULTURES WHEN MULTIPLE SETS ARE DRAWN IS UNCERTAIN. PLEASE NOTIFY THE MICROBIOLOGY DEPARTMENT WITHIN ONE WEEK IF SPECIATION AND SENSITIVITIES ARE REQUIRED. Performed at [facility], [address] [place], [place] [address]    Report Status 12/21/2022 FINAL  Final  Blood Culture ID Panel (Reflexed)     Status: Abnormal   Collection Time: 12/18/22  2:50 AM  Result Value Ref Range Status   Enterococcus faecalis NOT DETECTED NOT DETECTED Final   Enterococcus Faecium NOT DETECTED NOT DETECTED Final   Listeria monocytogenes NOT DETECTED NOT DETECTED Final   Staphylococcus species DETECTED (A) NOT DETECTED Final    Comment: CRITICAL RESULT CALLED TO, READ BACK BY AND VERIFIED WITH: PHARMD JUSTIN LEGGE [id number] AT 0829 BY EC    Staphylococcus aureus (BCID) NOT DETECTED NOT DETECTED Final   Staphylococcus epidermidis DETECTED (A) NOT  DETECTED Final    Comment: CRITICAL RESULT CALLED TO, READ BACK BY AND VERIFIED WITH: PHARMD JUSTIN LEGGE [id number] 0829 BY EC    Staphylococcus lugdunensis NOT DETECTED NOT DETECTED Final   Streptococcus species NOT DETECTED NOT DETECTED Final   Streptococcus agalactiae NOT DETECTED NOT DETECTED Final   Streptococcus pneumoniae NOT DETECTED NOT DETECTED Final   Streptococcus pyogenes NOT DETECTED NOT DETECTED Final   A.calcoaceticus-baumannii NOT DETECTED NOT DETECTED Final   Bacteroides fragilis NOT DETECTED NOT DETECTED Final   Enterobacterales NOT DETECTED NOT DETECTED Final   Enterobacter cloacae complex NOT DETECTED NOT DETECTED Final   Escherichia coli NOT DETECTED NOT DETECTED Final   Klebsiella aerogenes NOT DETECTED NOT DETECTED Final   Klebsiella oxytoca NOT DETECTED NOT DETECTED Final   Klebsiella pneumoniae NOT DETECTED NOT DETECTED Final   Proteus species NOT DETECTED NOT DETECTED Final   Salmonella species NOT DETECTED NOT DETECTED Final   Serratia marcescens NOT DETECTED NOT DETECTED Final   Haemophilus influenzae NOT DETECTED NOT DETECTED Final   Neisseria meningitidis NOT DETECTED NOT DETECTED Final   Pseudomonas aeruginosa NOT DETECTED NOT DETECTED Final   Stenotrophomonas maltophilia NOT DETECTED NOT DETECTED Final   Candida albicans NOT DETECTED NOT DETECTED Final   Candida auris NOT DETECTED NOT DETECTED Final   Candida glabrata NOT DETECTED NOT DETECTED Final   Candida krusei NOT DETECTED NOT DETECTED Final   Candida parapsilosis NOT DETECTED NOT DETECTED Final   Candida tropicalis NOT DETECTED NOT DETECTED Final   Cryptococcus neoformans/gattii NOT DETECTED NOT DETECTED Final   Methicillin resistance mecA/C NOT DETECTED NOT DETECTED Final    Comment: Performed at [facility], [address] [place], [place] [address]  Blood Culture (routine x 2)     Status: None   Collection Time: 12/18/22  2:55 AM   Specimen: BLOOD  Result Value Ref Range Status    Specimen Description   Final    BLOOD LEFT ANTECUBITAL Performed at [facility], [address], [place], [place] [address]    Special Requests   Final    Blood Culture adequate volume BOTTLES DRAWN AEROBIC AND ANAEROBIC Performed at [facility], [address], [place], [place] [address]    Culture  Final    NO GROWTH 5 DAYS Performed at [facility], [address], [place], [place] [address]    Report Status 12/23/2022 FINAL  Final  Pleural fluid culture w Gram Stain     Status: None   Collection Time: 12/18/22  1:12 PM   Specimen: Pleural Fluid  Result Value Ref Range Status   Specimen Description   Final    PLEURAL Performed at [facility], [address], [place], [place] [address]    Special Requests   Final    Immunocompromised Performed at [facility], [address], [place], [place] [address]    Gram Stain   Final    FEW WBC PRESENT, PREDOMINANTLY MONONUCLEAR NO ORGANISMS SEEN    Culture   Final    NO GROWTH 3 DAYS Performed at [facility], [address] [place], [place] [address]    Report Status 12/21/2022 FINAL  Final  Acid Fast Smear (AFB)     Status: None   Collection Time: 12/18/22  1:12 PM   Specimen: Pleural Fluid  Result Value Ref Range Status   AFB Specimen Processing Concentration  Final   Acid Fast Smear Negative  Final    Comment: (NOTE) Performed At: [facility] [address] [place], [place] [id number] Jolene Schimke MD NU:[id number]    Source (AFB) PLEURAL  Final    Comment: Performed at [facility], [address] [place], [place] [address]  Blastomyces Antigen     Status: None   Collection Time: 12/19/22  9:04 AM  Result Value Ref Range Status   Blastomyces Antigen None Detected None Detected ng/mL Final    Comment: (NOTE) Reference Interval: None Detected Reportable Range: 0.31 ng/mL - 20.00 ng/mL Results  above 20.00 ng/mL are reported as 'Positive, Above the Limit of Quantification' This test was developed and its performance characteristics determined by [employer]. It has not been cleared or approved by the FDA; however, FDA clearance or approval is not currently required for clinical use. The results are not intended to be used as the sole means for clinical diagnosis or patient decisions.    Specimen Type SERUM  Final    Comment: (NOTE) Performed At: [facility] [address] [place], [place] [id number] Roxanne Gates MD [id number]   Culture, Respiratory w Gram Stain     Status: None   Collection Time: 12/21/22  2:05 PM   Specimen: Bronchial Alveolar Lavage; Respiratory  Result Value Ref Range Status   Specimen Description   Final    BRONCHIAL ALVEOLAR LAVAGE LEFT UPPER LOBE Performed at [facility], [address] [place], [place] [address]    Special Requests   Final    BRONCHIAL ALVEOLAR LAVAGE LEFT UPPER LOBE Performed at [facility], [address] [place], [place] [address]    Gram Stain   Final    RARE WBC PRESENT, PREDOMINANTLY MONONUCLEAR NO ORGANISMS SEEN    Culture   Final    NO GROWTH 3 DAYS Performed at [facility], [address] [place], [place] [address]    Report Status 12/25/2022 FINAL  Final  Culture, Respiratory w Gram Stain     Status: None   Collection Time: 12/21/22  2:05 PM   Specimen: Bronchial Alveolar Lavage; Respiratory  Result Value Ref Range Status   Specimen Description   Final    BRONCHIAL ALVEOLAR LAVAGE LEFT LOWER LOBE Performed at  [facility], [address] [place], [place] [address]    Special Requests   Final    BRONCHIAL ALVEOLAR LAVAGE LEFT LOWER LOBE Performed at [facility], [address] [place], [place] [address]    Gram Stain NO WBC SEEN NO ORGANISMS SEEN   Final   Culture   Final    NO GROWTH 3  DAYS Performed at [facility], [address] [place], [place] [address]    Report Status 12/25/2022 FINAL  Final  Acid Fast Smear (AFB)     Status: None   Collection Time: 12/21/22  2:05 PM   Specimen: Bronchial Alveolar Lavage; Respiratory  Result Value Ref Range Status   AFB Specimen Processing Concentration  Final   Acid Fast Smear Negative  Final    Comment: (NOTE) Performed At: [facility] [address] [place], [place] [id number] Jolene Schimke MD UU:[id number]    Source (AFB) BRONCHIAL ALVEOLAR LAVAGE  Final    Comment: LEFT UPPER LOBE Performed at [facility], [address], [place], [place] [address]   Acid Fast Smear (AFB)     Status: None   Collection Time: 12/21/22  2:05 PM   Specimen: Bronchial Alveolar Lavage; Respiratory  Result Value Ref Range Status   AFB Specimen Processing Concentration  Final   Acid Fast Smear Negative  Final    Comment: (NOTE) Performed At: [facility] [address] [place], [place] [id number] Jolene Schimke MD [id number]    Source (AFB) BRONCHIAL ALVEOLAR LAVAGE  Final    Comment:  LEFT LOWER LOBE Performed at [facility], [address] [place], [place] [address]   Anaerobic culture w Gram Stain     Status: None   Collection Time: 12/21/22  2:05 PM   Specimen: Bronchoalveolar Lavage  Result Value Ref Range Status   Specimen Description BRONCHIAL ALVEOLAR LAVAGE  Final   Special Requests LEFT LOWER LOBE  Final   Gram Stain   Final    WBC PRESENT, PREDOMINANTLY MONONUCLEAR NO ORGANISMS SEEN CYTOSPIN SMEAR    Culture   Final    NO ANAEROBES ISOLATED Performed at [facility], [address], [place], [place] [address]    Report Status 12/26/2022 FINAL  Final  Anaerobic culture w Gram Stain     Status: None   Collection Time: 12/21/22  2:05 PM   Specimen: Bronchoalveolar Lavage  Result Value Ref Range Status   Specimen Description BRONCHIAL ALVEOLAR  LAVAGE  Final   Special Requests LEFT UPPER LOBE  Final   Gram Stain   Final    WBC PRESENT, PREDOMINANTLY MONONUCLEAR NO ORGANISMS SEEN CYTOSPIN SMEAR    Culture   Final    NO ANAEROBES ISOLATED Performed at [facility], [address], [place], [place] [address]    Report Status 12/26/2022 FINAL  Final  CSF culture w Gram Stain     Status: None   Collection Time: 12/22/22 12:34 PM   Specimen: CSF; Cerebrospinal Fluid  Result Value Ref Range Status   Specimen Description   Final    CSF Performed at [facility], [address], [place], [place] [address]    Special Requests   Final    Normal Performed at [facility], [address], [place], [place] [address]    Gram Stain NO WBC SEEN NO ORGANISMS SEEN CYTOSPIN SMEAR   Final   Culture   Final  NO GROWTH 3 DAYS Performed at [facility], [address] [place], [place] [address]    Report Status 12/25/2022 FINAL  Final     Medications:    bictegravir-emtricitabine-tenofovir AF  1 tablet Oral Daily   dronabinol  5 mg Oral BID AC   enoxaparin (LOVENOX) injection  40 mg Subcutaneous Q24H   feeding supplement  237 mL Oral BID BM   fentaNYL  1 patch Transdermal Q72H   fluconazole  200 mg Oral Daily   lidocaine-EPINEPHrine  20 mL Intradermal Once   sulfamethoxazole-trimethoprim  1 tablet Oral Daily   Continuous Infusions:      LOS: 9 days   Marinda Elk  [facility]  12/27/2022, 10:29 AM

## 2022-12-27 NOTE — Procedures (Signed)
Pre Procedure Dx: Lung cancer Post Procedural Dx: Same  Successful placement of right IJ approach port-a-cath with tip at the superior caval atrial junction. The catheter is ready for immediate use.  Estimated Blood Loss: Trace  Complications: None immediate.  Katherina Right, MD Pager #: [phone]

## 2022-12-27 NOTE — Progress Notes (Signed)
Subjective:   Patient is a 42 y.o. male admitted on 12/18/2022 for chemotherapy with Small Cell Lung Cancer. Today is day Cycle 1/Day 1 of therapy.   Carboplatin 580 mg via infusion via Port.  Blood return noted. Pt sitting in chair watching t.v.  Slightly drowsy.  Vs taken and stable.   Appropriate portions of the patient's past medical, surgical, family, and social history were reviewed and updated.   Objective:   Patient Vitals for the past 8 hrs:  BP Temp Temp src Pulse Resp SpO2  12/27/22 1617 (!) 123/98 -- -- (!) 108 20 100 %  12/27/22 1357 (!) 143/86 98.4 F (36.9 C) Oral 93 20 97 %  12/27/22 1128 (!) 148/103 98 F (36.7 C) Oral 86 18 92 %  12/27/22 1055 120/85 -- -- 77 12 96 %  12/27/22 1050 (!) 137/99 -- -- -- 19 97 %  12/27/22 1045 (!) 144/92 -- -- -- 14 97 %  12/27/22 1040 (!) 134/95 -- -- -- 15 97 %  12/27/22 1035 (!) 152/100 -- -- 76 18 96 %  12/27/22 1030 (!) 118/100 -- -- -- 19 98 %  12/27/22 1025 (!) 115/99 -- -- 76 15 94 %  12/27/22 1020 99/76 -- -- -- 16 94 %  12/27/22 1015 130/84 -- -- 70 18 (!) 86 %  12/27/22 1010 (!) 75/49 -- -- -- (!) 21 --  12/27/22 1005 (!) 80/51 -- -- 72 15 90 %  12/27/22 1000 (!) 76/52 -- -- 68 19 94 %  12/27/22 0955 (!) 88/74 -- -- -- 19 94 %  12/27/22 0950 108/85 -- -- 72 (!) 28 94 %  12/27/22 0945 (!) 154/101 -- -- -- 16 97 %

## 2022-12-27 NOTE — Progress Notes (Signed)
Subjective:  Today's day Cycle 1/Day 1 of therapy.   Administered Etoposide 200 mg via infusion via Portacath.  Blood return noted.   Pt tolerating well.  Will continue to monitor.

## 2022-12-27 NOTE — Procedures (Signed)
Pre-procedure Diagnosis: Lung cancer Post-procedure Diagnosis: Same  Technically successful CT guided bone marrow aspiration and biopsy of left iliac crest.   Complications: None Immediate EBL: Trace  Signed: Simonne Come Pager: [phone] 12/27/2022, 10:07 AM

## 2022-12-27 NOTE — Progress Notes (Signed)
Hopefully, he will have both the bone marrow biopsy in the Port-A-Cath placed today.  We really need to be going on chemotherapy for him.  This mediastinal mass is causing a lot of problems for him.  I would think that with chemotherapy, we should be able to get this mass shrinking fairly quickly.  He has a little better pain control.  I think that he is probably going to need something that is time-released.  I probably favor a Duragesic patch for him.  He is eating okay.  He is having no problems going to the bathroom.  There is no bleeding.  He has had no cough.  There are no labs back yet today.   His vital signs show temperature of 98.6.  Pulse 86.  Blood pressure 145/102.  His lungs sound clear bilaterally.  He has good air movement bilaterally.  Cardiac exam regular rate and rhythm.  He has no murmurs.  Abdomen is soft.  Bowel sounds are present.  He has no fluid wave.  Extremity shows no clubbing, cyanosis or edema.  Neurological exam shows no focal neurological deficits.  Again, he has extensive stage small cell lung cancer.  I do think lumbar biopsy would be helpful to see if there is small cell in his bone marrow.  Again, maybe if his procedure done this morning, he started chemotherapy this afternoon.  I do appreciate everybody's help.  I know this has been somewhat challenging.   Christin Bach, MD  [address]  5:16-18

## 2022-12-27 NOTE — Progress Notes (Addendum)
MEDICATION-RELATED CONSULT NOTE   IR Procedure Consult - Anticoagulant/Antiplatelet PTA/Inpatient Med List Review by Pharmacist    Procedure: CT guided bone marrow aspiration and biopsy of left iliac crest and port-a-cath placement.    Completed: 12/27/22 at ~11a  Post-Procedural bleeding risk per IR MD assessment:  standard   Antithrombotic medications on inpatient or PTA profile prior to procedure:    - lovenox 40mg  daily  (last dose given at ~2200 on 12/26/22)  Recommended restart time per IR Post-Procedure Guidelines:   - Day + 1 (next AM)  Plan:     - resume lovenox 40mg  daily on 12/28/22 at 10a - pharmacy will sign off. Re-consult [place] if need further assistance  Dorna Leitz, PharmD, BCPS 12/27/2022 11:03 AM

## 2022-12-28 ENCOUNTER — Inpatient Hospital Stay (HOSPITAL_COMMUNITY): Payer: 59

## 2022-12-28 DIAGNOSIS — J90 Pleural effusion, not elsewhere classified: Secondary | ICD-10-CM | POA: Diagnosis not present

## 2022-12-28 DIAGNOSIS — E876 Hypokalemia: Secondary | ICD-10-CM | POA: Diagnosis not present

## 2022-12-28 DIAGNOSIS — B451 Cerebral cryptococcosis: Secondary | ICD-10-CM

## 2022-12-28 DIAGNOSIS — F17200 Nicotine dependence, unspecified, uncomplicated: Secondary | ICD-10-CM | POA: Diagnosis not present

## 2022-12-28 DIAGNOSIS — C3492 Malignant neoplasm of unspecified part of left bronchus or lung: Secondary | ICD-10-CM | POA: Diagnosis not present

## 2022-12-28 LAB — COMPREHENSIVE METABOLIC PANEL
ALT: 49 U/L — ABNORMAL HIGH (ref 0–44)
AST: 41 U/L (ref 15–41)
Albumin: 4.2 g/dL (ref 3.5–5.0)
Alkaline Phosphatase: 91 U/L (ref 38–126)
Anion gap: 12 (ref 5–15)
BUN: 16 mg/dL (ref 6–20)
CO2: 21 mmol/L — ABNORMAL LOW (ref 22–32)
Calcium: 9.6 mg/dL (ref 8.9–10.3)
Chloride: 105 mmol/L (ref 98–111)
Creatinine, Ser: 1.01 mg/dL (ref 0.61–1.24)
GFR, Estimated: >60 mL/min (ref >60)
Glucose, Bld: 129 mg/dL — ABNORMAL HIGH (ref 70–99)
Potassium: 4.1 mmol/L (ref 3.5–5.1)
Sodium: 138 mmol/L (ref 135–145)
Total Bilirubin: 0.9 mg/dL (ref 0.3–1.2)
Total Protein: 8 g/dL (ref 6.5–8.1)

## 2022-12-28 LAB — CBC WITH DIFFERENTIAL/PLATELET
Abs Immature Granulocytes: 0.09 10*3/uL — ABNORMAL HIGH (ref 0.00–0.07)
Basophils Absolute: 0 10*3/uL (ref 0.0–0.1)
Basophils Relative: 0 %
Eosinophils Absolute: 0 10*3/uL (ref 0.0–0.5)
Eosinophils Relative: 0 %
HCT: 46.6 % (ref 39.0–52.0)
Hemoglobin: 16.4 g/dL (ref 13.0–17.0)
Immature Granulocytes: 1 %
Lymphocytes Relative: 5 %
Lymphs Abs: 0.8 10*3/uL (ref 0.7–4.0)
MCH: 31.8 pg (ref 26.0–34.0)
MCHC: 35.2 g/dL (ref 30.0–36.0)
MCV: 90.5 fL (ref 80.0–100.0)
Monocytes Absolute: 0.7 10*3/uL (ref 0.1–1.0)
Monocytes Relative: 4 %
Neutro Abs: 15.9 10*3/uL — ABNORMAL HIGH (ref 1.7–7.7)
Neutrophils Relative %: 90 %
Platelets: 273 10*3/uL (ref 150–400)
RBC: 5.15 MIL/uL (ref 4.22–5.81)
RDW: 13.4 % (ref 11.5–15.5)
WBC: 17.5 10*3/uL — ABNORMAL HIGH (ref 4.0–10.5)
nRBC: 0 % (ref 0.0–0.2)

## 2022-12-28 LAB — HIV-1 RNA QUANT-NO REFLEX-BLD
HIV 1 RNA Quant: <20 copies/mL
LOG10 HIV-1 RNA: UNDETERMINED log10copy/mL

## 2022-12-28 MED ORDER — SODIUM CHLORIDE 0.9 % IV SOLN
10.0000 mg | Freq: Once | INTRAVENOUS | Status: AC
  Administered 2022-12-28: 10 mg via INTRAVENOUS
  Filled 2022-12-28: qty 1

## 2022-12-28 MED ORDER — SODIUM CHLORIDE 0.9 % IV SOLN: Freq: Once | INTRAVENOUS | Status: DC

## 2022-12-28 MED ORDER — SODIUM CHLORIDE 0.9 % IV SOLN
100.0000 mg/m2 | Freq: Once | INTRAVENOUS | Status: AC
  Administered 2022-12-28: 200 mg via INTRAVENOUS
  Filled 2022-12-28: qty 10

## 2022-12-28 MED ORDER — SODIUM CHLORIDE 0.9 % IV SOLN
100.0000 mg/m2 | Freq: Once | INTRAVENOUS | Status: AC
  Administered 2022-12-29: 200 mg via INTRAVENOUS
  Filled 2022-12-28: qty 10

## 2022-12-28 MED ORDER — SODIUM CHLORIDE 0.9 % IV SOLN
10.0000 mg | Freq: Once | INTRAVENOUS | Status: AC
  Administered 2022-12-29: 10 mg via INTRAVENOUS
  Filled 2022-12-28: qty 1

## 2022-12-28 NOTE — Progress Notes (Signed)
Christopher Henson received day 2 of his etoposide chemotherapy today with no issues. Stable throughout treatment no issues at this time.

## 2022-12-28 NOTE — Progress Notes (Signed)
Pt feeling more short of breath today. SOB at rest and on exertion. Satting 92% on 3L currently. Dr. Alanda Slim notified. Chest xray ordered.

## 2022-12-28 NOTE — Progress Notes (Signed)
Christopher Henson went ahead and started his chemotherapy yesterday.  I am very thankful for the staff up on 6 [address]. for being able to do this for [place].  He had the bone marrow biopsy done yesterday.  He had his Port-A-Cath placed also.  So far, he has had no problems.  He is not having as much pain with the mediastinal mass.  His labs look okay.  His sodium 138.  Potassium 4.1.  BUN 16 creatinine 1.01.  Calcium 9.6 with an albumin of 4.2.  The white cell count is 17.5.  Hemoglobin 16.4.  Platelet count 273,000.  His appetite is okay.  He has had no nausea or vomiting.  There is no issues with fever.  He has had no bleeding.  Has had no problems with bowels or bladder.  We will go ahead with his second day of treatment.  He will get his third day tomorrow and then we can probably let him go home from my point of view.  He can always come back to our office on Monday for his Neulasta injection.   Christin Bach, MD

## 2022-12-28 NOTE — Progress Notes (Signed)
   12/28/22 1339  TOC Brief Assessment  Insurance and Status Reviewed  Patient has primary care physician Yes  Home environment has been reviewed Yes (From home)  Prior level of function: Independent  Prior/Current Home Services No current home services  Social Determinants of Health Reivew SDOH reviewed no interventions necessary  Readmission risk has been reviewed Yes  Transition of care needs no transition of care needs at this time

## 2022-12-28 NOTE — Progress Notes (Signed)
PROGRESS NOTE  Christopher Henson JXB:[id number] DOB: Jul 09, 1980   PCP: [name], PA-C  Patient is from: Home.  DOA: 12/18/2022 LOS: 10  Chief complaints Chief Complaint  Patient presents with   Chest Pain     Brief Narrative / Interim history: 42 year old M with PMH of HIV/AIDS,  cryptococcal meningitis, alcohol use and tobacco use presenting with chest pain, shortness of breath, generalized weakness and unintentional weight loss, and admitted for anterior mediastinal mass.  Patient stated he has not been able to take his antiviral medications for about a week due to cost.  He was seen in ED on 9/3 and had chest x-ray that was concerning for mediastinal mass.  He was recommended to have CT but he left AMA.    In ED, CT angio chest negative for PE but 8 x 7.5 cm anterior mediastinal mass, 1.6 x 1.2 cm LUL nodule, interval increase in peribronchovascular groundglass airspace opacities within LUL and interval increase in size of small left pleural effusion.  CT abdomen and pelvis without significant finding.  Pulmonology and infectious disease consulted.  He underwent left thoracocentesis with removal of 800 cc exudative fluid.   Pleural fluid culture NGTD.  Cytology with malignant cells.  Underwent bronchoscopy on 9/12.  Biopsy consistent with malignancy.  Underwent bone marrow biopsy on 9/18.  Had a port a cath placed on 9/18 and started chemotherapy as well.  Patient also had elevated serum cryptococcal antigen for which she was started on Diflucan.  Underwent lumbar puncture on 9/13 with opening pressure of 21 cm water.  Fluid study negative for cryptococcal antigen.  Continued on Diflucan per ID.     Pulmonology planning bronchoscopy on 9/12.  ID recommended CT head then lumbar puncture.  Currently on IV Unasyn.  Subjective: Seen and examined earlier this morning.  No major events overnight of this morning.  Getting his second dose of chemotherapy today.  No  complaints.  Objective: Vitals:   12/27/22 1811 12/27/22 2032 12/28/22 0536 12/28/22 1423  BP: (!) 148/94 (!) 139/95 (!) 156/94 (!) 169/98  Pulse: 93 (!) 106 (!) 110 (!) 106  Resp:  16 17   Temp:  98.3 F (36.8 C) 98.3 F (36.8 C) 97.7 F (36.5 C)  TempSrc: Oral Oral Oral Oral  SpO2:  91% 91% (!) 85%  Weight:      Height:        Examination:  GENERAL: No apparent distress.  Nontoxic. HEENT: MMM.  Vision and hearing grossly intact.  NECK: Supple.  No apparent JVD.  RESP:  No IWOB.  Fair aeration bilaterally. CVS:  RRR. Heart sounds normal.  ABD/GI/GU: BS+. Abd soft, NTND.  MSK/EXT:  Moves extremities. No apparent deformity. No edema.  SKIN: no apparent skin lesion or wound NEURO: Awake, alert and oriented appropriately.  No apparent focal neuro deficit. PSYCH: Calm. Normal affect.   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  Pleural fluid with malignant cells.  Underwent bronchoscopy.  Pathology showed lung cancer.  MRI brain without acute finding.  Port a cath placed on 9/18, started chemotherapy. -Continue chemotherapy per oncology  Acute respiratory failure with hypoxia: Due to the above.  Resolved.   HIV: CD4 count 258.  Noncompliant with meds. -Appreciate ID recs-continue Biktarvy, Diflucan and Bactrim   Serum positive cryptococcal antigen: Underwent fluoroscopy  guided LP with opening pressure of 21 cm water.  Fluid study negative for cryptococcal antigen. -ID recommends continuing Diflucan until outpatient follow-up on 10/16.   Anxiety: -Continue Ativan as needed   Tobacco abuse: -Continue nicotine patch   Alcohol use: No signs of withdrawal No signs of withdrawal continue thiamine and folate.   Noncompliance: -Has been counseled.  Mild elevated LFT: Improved.   Hypokalemia: -Monitor replenish as appropriate   Blood cultures positive: for Staph epidermidis 1 out of 4 likely contaminant.  Leukocytosis: Likely demargination from steroid  Body mass index is 23.59 kg/m.           DVT prophylaxis:  enoxaparin (LOVENOX) injection 40 mg Start: 12/28/22 1000  Code Status: Full code Family Communication: None at bedside Level of care: Med-Surg Status is: Inpatient Remains inpatient appropriate because: Lung cancer   Final disposition: Home Consultants:  Pulmonology Infectious disease Interventional radiology Oncology  35 minutes with more than 50% spent in reviewing records, counseling patient/family and coordinating care.   Sch Meds:  Scheduled Meds:  bictegravir-emtricitabine-tenofovir AF  1 tablet Oral Daily   Chlorhexidine Gluconate Cloth  6 each Topical Daily   dronabinol  5 mg Oral BID AC   enoxaparin (LOVENOX) injection  40 mg Subcutaneous Q24H   [START ON 12/29/2022] etoposide  100 mg/m2 (Treatment Plan Recorded) Intravenous Once   feeding supplement  237 mL Oral BID BM   fentaNYL  1 patch Transdermal Q72H   fluconazole  200 mg Oral Daily   polyethylene glycol  17 g Oral BID   sulfamethoxazole-trimethoprim  1 tablet Oral Daily   Continuous Infusions:  sodium chloride     [START ON 12/29/2022] dexamethasone (DECADRON) IVPB ([facility])     PRN Meds:.alum & mag hydroxide-simeth, ipratropium-albuterol, LORazepam, ondansetron **OR** ondansetron (ZOFRAN) IV, mouth rinse, oxyCODONE  Antimicrobials: Anti-infectives  (From admission, onward)    Start     Dose/Rate Route Frequency Ordered Stop   12/25/22 1145  sulfamethoxazole-trimethoprim (BACTRIM DS) 800-160 MG per tablet 1 tablet        1 tablet Oral Daily 12/25/22 1057     12/24/22 1000  Ampicillin-Sulbactam (UNASYN) 3 g in sodium chloride 0.9 % 100 mL IVPB        3 g 200 mL/hr over 30 Minutes Intravenous Every 6 hours 12/24/22 0929 12/25/22 1900   12/22/22 2030  fluconazole (DIFLUCAN) tablet 200 mg        200 mg Oral Daily 12/22/22 1915     12/18/22 2300  bictegravir-emtricitabine-tenofovir AF (BIKTARVY) 50-200-25 MG per tablet 1 tablet        1 tablet Oral Daily 12/18/22 1910     12/18/22 1100  Ampicillin-Sulbactam (UNASYN) 3 g in sodium chloride 0.9 % 100 mL IVPB  Status:  Discontinued        3 g 200 mL/hr over 30 Minutes Intravenous Every 6 hours 12/18/22 1054 12/24/22 0929   12/18/22 0500  sulfamethoxazole-trimethoprim (BACTRIM) 415 mg of trimethoprim in dextrose 5 % 500 mL IVPB  Status:  Discontinued        415 mg of trimethoprim 350.6 mL/hr over 90 Minutes Intravenous Every 6 hours 12/18/22  6578 12/18/22 1051        I have personally reviewed the following labs and images: CBC: Recent Labs  Lab 12/24/22 0944 12/25/22 0542 12/26/22 0832 12/27/22 1430 12/28/22 0449  WBC 6.6 7.0 7.9 11.1* 17.5*  NEUTROABS 3.7 3.8 4.6 8.3* 15.9*  HGB 16.5 16.7 16.6 15.6 16.4  HCT 46.9 47.9 48.0 44.6 46.6  MCV 90.2 92.1 90.1 89.9 90.5  PLT 222 236 249 246 273   BMP &GFR Recent Labs  Lab 12/21/22 1813 12/24/22 0944 12/26/22 0832 12/27/22 1430 12/28/22 0449  NA 138 137 138 139 138  K 3.8 3.7 3.9 4.2 4.1  CL 102 103 104 104 105  CO2 25 24 25 24  21*  GLUCOSE 193* 97 89 100* 129*  BUN 11 12 13 13 16   CREATININE 1.37* 1.15 1.15 1.01 1.01  CALCIUM 9.1 9.0 9.2 9.2 9.6  MG 2.4  --   --   --   --   PHOS 2.2*  --   --   --   --    Estimated Creatinine Clearance: 102.4 mL/min (by C-G formula based on SCr of 1.01 mg/dL). Liver &  Pancreas: Recent Labs  Lab 12/21/22 1813 12/24/22 0944 12/26/22 0832 12/27/22 1430 12/28/22 0449  AST  --  18 22 24  41  ALT  --  22 32 32 49*  ALKPHOS  --  67 71 77 91  BILITOT  --  0.5 0.7 0.8 0.9  PROT  --  7.0 7.1 7.1 8.0  ALBUMIN 4.2 3.8 4.0 3.7 4.2   No results for input(s): "LIPASE", "AMYLASE" in the last 168 hours. No results for input(s): "AMMONIA" in the last 168 hours. Diabetic: No results for input(s): "HGBA1C" in the last 72 hours. No results for input(s): "GLUCAP" in the last 168 hours. Cardiac Enzymes: No results for input(s): "CKTOTAL", "CKMB", "CKMBINDEX", "TROPONINI" in the last 168 hours. No results for input(s): "PROBNP" in the last 8760 hours. Coagulation Profile: No results for input(s): "INR", "PROTIME" in the last 168 hours.  Thyroid Function Tests: No results for input(s): "TSH", "T4TOTAL", "FREET4", "T3FREE", "THYROIDAB" in the last 72 hours. Lipid Profile: No results for input(s): "CHOL", "HDL", "LDLCALC", "TRIG", "CHOLHDL", "LDLDIRECT" in the last 72 hours.  Anemia Panel: No results for input(s): "VITAMINB12", "FOLATE", "FERRITIN", "TIBC", "IRON", "RETICCTPCT" in the last 72 hours. Urine analysis:    Component Value Date/Time   COLORURINE STRAW (A) 12/18/2022 0552   APPEARANCEUR CLEAR 12/18/2022 0552   LABSPEC 1.025 12/18/2022 0552   PHURINE 7.0 12/18/2022 0552   GLUCOSEU NEGATIVE 12/18/2022 0552   HGBUR NEGATIVE 12/18/2022 0552   BILIRUBINUR NEGATIVE 12/18/2022 0552   KETONESUR NEGATIVE 12/18/2022 0552   PROTEINUR NEGATIVE 12/18/2022 0552   NITRITE NEGATIVE 12/18/2022 0552   LEUKOCYTESUR NEGATIVE 12/18/2022 0552   Sepsis Labs: Invalid input(s): "PROCALCITONIN", "LACTICIDVEN"  Microbiology: Recent Results (from the past 240 hour(s))  Blastomyces Antigen     Status: None   Collection Time: 12/19/22  9:04 AM  Result Value Ref Range Status   Blastomyces Antigen None Detected None Detected ng/mL Final    Comment: (NOTE) Reference  Interval: None Detected Reportable Range: 0.31 ng/mL - 20.00 ng/mL Results above 20.00 ng/mL are reported as 'Positive, Above the Limit of Quantification' This test was developed and its performance characteristics determined by [employer]. It has not been cleared or approved by the FDA; however, FDA clearance or approval is not currently required for clinical use. The results are not intended to be used as the  sole means for clinical diagnosis or patient decisions.    Specimen Type SERUM  Final    Comment: (NOTE) Performed At: [facility] [address] [place], [place] [id number] Roxanne Gates MD WU:[id number]   Fungus Culture With Stain     Status: None (Preliminary result)   Collection Time: 12/21/22  2:05 PM   Specimen: Bronchial Alveolar Lavage; Respiratory  Result Value Ref Range Status   Fungus Stain Final report  Final    Comment: (NOTE) Performed At: [facility] [address] [place], [place] [id number] Jolene Schimke MD VH:[id number]    Fungus (Mycology) Culture PENDING  Incomplete   Fungal Source BRONCHIAL ALVEOLAR LAVAGE  Final    Comment: LEFT UPPER LOBE Performed at [facility], [address], [place], [place] [address]   Fungus Culture With Stain     Status: None (Preliminary result)   Collection Time: 12/21/22  2:05 PM   Specimen: Bronchial Alveolar Lavage; Respiratory  Result Value Ref Range Status   Fungus Stain Final report  Final    Comment: (NOTE) Performed At: [facility] [address] [place], [place] [id number] Jolene Schimke MD GU:[id number]    Fungus (Mycology) Culture PENDING  Incomplete   Fungal Source BRONCHIAL ALVEOLAR LAVAGE  Final    Comment: LEFT LOWER LOBE Performed at [facility], [address], [place], [place] [address]   Culture, Respiratory w Gram Stain     Status: None   Collection Time: 12/21/22  2:05 PM   Specimen: Bronchial Alveolar  Lavage; Respiratory  Result Value Ref Range Status   Specimen Description   Final    BRONCHIAL ALVEOLAR LAVAGE LEFT UPPER LOBE Performed at [facility], [address] [place], [place] [address]    Special Requests   Final    BRONCHIAL ALVEOLAR LAVAGE LEFT UPPER LOBE Performed at [facility], [address] [place], [place] [address]    Gram Stain   Final    RARE WBC PRESENT, PREDOMINANTLY MONONUCLEAR NO ORGANISMS SEEN    Culture   Final    NO GROWTH 3 DAYS Performed at [facility], [address] [place], [place] [address]    Report Status 12/25/2022 FINAL  Final  Culture, Respiratory w Gram Stain     Status: None   Collection Time: 12/21/22  2:05 PM   Specimen: Bronchial Alveolar Lavage; Respiratory  Result Value Ref Range Status   Specimen Description   Final    BRONCHIAL ALVEOLAR LAVAGE LEFT LOWER LOBE Performed at [facility], [address], [place], [place] [address]    Special Requests   Final    BRONCHIAL ALVEOLAR LAVAGE LEFT LOWER LOBE Performed at [facility], [address], [place], [place] [address]    Gram Stain NO WBC SEEN NO ORGANISMS SEEN   Final   Culture   Final    NO GROWTH 3 DAYS Performed at [facility], [address] [place], [place] [address]    Report Status 12/25/2022 FINAL  Final  Acid Fast Smear (AFB)     Status: None   Collection Time: 12/21/22  2:05 PM   Specimen: Bronchial Alveolar Lavage; Respiratory  Result Value Ref Range Status   AFB Specimen Processing Concentration  Final   Acid Fast Smear Negative  Final    Comment: (NOTE) Performed At: [facility] [address] [place], [place] [id number] Jolene Schimke MD HC:[id number]  Source (AFB) BRONCHIAL ALVEOLAR LAVAGE  Final    Comment: LEFT UPPER LOBE Performed at [facility], [address] [place], [place] [address]   Acid Fast Smear (AFB)      Status: None   Collection Time: 12/21/22  2:05 PM   Specimen: Bronchial Alveolar Lavage; Respiratory  Result Value Ref Range Status   AFB Specimen Processing Concentration  Final   Acid Fast Smear Negative  Final    Comment: (NOTE) Performed At: [facility] [address] [place], [place] [id number] Jolene Schimke MD WU:[id number]    Source (AFB) BRONCHIAL ALVEOLAR LAVAGE  Final    Comment:  LEFT LOWER LOBE Performed at [facility], [address] [place], [place] [address]   Anaerobic culture w Gram Stain     Status: None   Collection Time: 12/21/22  2:05 PM   Specimen: Bronchoalveolar Lavage  Result Value Ref Range Status   Specimen Description BRONCHIAL ALVEOLAR LAVAGE  Final   Special Requests LEFT LOWER LOBE  Final   Gram Stain   Final    WBC PRESENT, PREDOMINANTLY MONONUCLEAR NO ORGANISMS SEEN CYTOSPIN SMEAR    Culture   Final    NO ANAEROBES ISOLATED Performed at [facility], [address] [place], [place] [address]    Report Status 12/26/2022 FINAL  Final  Anaerobic culture w Gram Stain     Status: None   Collection Time: 12/21/22  2:05 PM   Specimen: Bronchoalveolar Lavage  Result Value Ref Range Status   Specimen Description BRONCHIAL ALVEOLAR LAVAGE  Final   Special Requests LEFT UPPER LOBE  Final   Gram Stain   Final    WBC PRESENT, PREDOMINANTLY MONONUCLEAR NO ORGANISMS SEEN CYTOSPIN SMEAR    Culture   Final    NO ANAEROBES ISOLATED Performed at [facility], [address] [place], [place] [address]    Report Status 12/26/2022 FINAL  Final  Fungus Culture Result     Status: None   Collection Time: 12/21/22  2:05 PM  Result Value Ref Range Status   Result 1 Comment  Final    Comment: (NOTE) KOH/Calcofluor preparation:  no fungus observed. Performed At: [facility] [address] [place], [place] [id number] Jolene Schimke MD PI:[id number]   Fungus Culture Result     Status: None   Collection  Time: 12/21/22  2:05 PM  Result Value Ref Range Status   Result 1 Comment  Final    Comment: (NOTE) KOH/Calcofluor preparation:  no fungus observed. Performed At: [facility] [place] [facility] [address] [place], [place] [id number] Jolene Schimke MD NA:[id number]   CSF culture w Gram Stain     Status: None   Collection Time: 12/22/22 12:34 PM   Specimen: CSF; Cerebrospinal Fluid  Result Value Ref Range Status   Specimen Description   Final    CSF Performed at [facility], [address], [place], [place] [address]    Special Requests   Final    Normal Performed at [facility], [address] [place], [place] [address]    Gram Stain NO WBC SEEN NO ORGANISMS SEEN CYTOSPIN SMEAR   Final   Culture   Final    NO GROWTH 3 DAYS Performed at [facility], [address], [place], [place] [address]    Report Status 12/25/2022 FINAL  Final    Radiology Studies: No results found.    Alfie Alderfer T. [facility]  If 7PM-7AM, please contact night-coverage [email or web address] 12/28/2022, 3:37 PM

## 2022-12-28 NOTE — Plan of Care (Signed)

## 2022-12-29 ENCOUNTER — Inpatient Hospital Stay (HOSPITAL_COMMUNITY): Payer: 59

## 2022-12-29 ENCOUNTER — Encounter: Payer: Self-pay | Admitting: *Deleted

## 2022-12-29 DIAGNOSIS — E876 Hypokalemia: Secondary | ICD-10-CM | POA: Diagnosis not present

## 2022-12-29 DIAGNOSIS — J90 Pleural effusion, not elsewhere classified: Secondary | ICD-10-CM | POA: Diagnosis not present

## 2022-12-29 DIAGNOSIS — C3492 Malignant neoplasm of unspecified part of left bronchus or lung: Secondary | ICD-10-CM | POA: Diagnosis not present

## 2022-12-29 DIAGNOSIS — F17200 Nicotine dependence, unspecified, uncomplicated: Secondary | ICD-10-CM | POA: Diagnosis not present

## 2022-12-29 LAB — CBC WITH DIFFERENTIAL/PLATELET
Abs Immature Granulocytes: 0.48 10*3/uL — ABNORMAL HIGH (ref 0.00–0.07)
Basophils Absolute: 0.1 10*3/uL (ref 0.0–0.1)
Basophils Relative: 0 %
Eosinophils Absolute: 0 10*3/uL (ref 0.0–0.5)
Eosinophils Relative: 0 %
HCT: 45.4 % (ref 39.0–52.0)
Hemoglobin: 15.9 g/dL (ref 13.0–17.0)
Immature Granulocytes: 2 %
Lymphocytes Relative: 3 %
Lymphs Abs: 1 10*3/uL (ref 0.7–4.0)
MCH: 31.7 pg (ref 26.0–34.0)
MCHC: 35 g/dL (ref 30.0–36.0)
MCV: 90.4 fL (ref 80.0–100.0)
Monocytes Absolute: 0.8 10*3/uL (ref 0.1–1.0)
Monocytes Relative: 3 %
Neutro Abs: 27.3 10*3/uL — ABNORMAL HIGH (ref 1.7–7.7)
Neutrophils Relative %: 92 %
Platelets: 270 10*3/uL (ref 150–400)
RBC: 5.02 MIL/uL (ref 4.22–5.81)
RDW: 13.5 % (ref 11.5–15.5)
WBC: 29.5 10*3/uL — ABNORMAL HIGH (ref 4.0–10.5)
nRBC: 0 % (ref 0.0–0.2)

## 2022-12-29 LAB — COMPREHENSIVE METABOLIC PANEL
ALT: 54 U/L — ABNORMAL HIGH (ref 0–44)
AST: 34 U/L (ref 15–41)
Albumin: 3.8 g/dL (ref 3.5–5.0)
Alkaline Phosphatase: 102 U/L (ref 38–126)
Anion gap: 10 (ref 5–15)
BUN: 17 mg/dL (ref 6–20)
CO2: 24 mmol/L (ref 22–32)
Calcium: 9.5 mg/dL (ref 8.9–10.3)
Chloride: 106 mmol/L (ref 98–111)
Creatinine, Ser: 0.97 mg/dL (ref 0.61–1.24)
GFR, Estimated: >60 mL/min (ref >60)
Glucose, Bld: 123 mg/dL — ABNORMAL HIGH (ref 70–99)
Potassium: 4.5 mmol/L (ref 3.5–5.1)
Sodium: 140 mmol/L (ref 135–145)
Total Bilirubin: 0.9 mg/dL (ref 0.3–1.2)
Total Protein: 7.7 g/dL (ref 6.5–8.1)

## 2022-12-29 LAB — LACTATE DEHYDROGENASE: LDH: 738 U/L — ABNORMAL HIGH (ref 98–192)

## 2022-12-29 LAB — SURGICAL PATHOLOGY

## 2022-12-29 MED ORDER — LIDOCAINE HCL 1 % IJ SOLN
INTRAMUSCULAR | Status: AC
  Filled 2022-12-29: qty 20

## 2022-12-29 MED ORDER — SENNOSIDES-DOCUSATE SODIUM 8.6-50 MG PO TABS
2.0000 | ORAL_TABLET | Freq: Two times a day (BID) | ORAL | Status: DC | PRN
  Administered 2022-12-29 – 2022-12-30 (×2): 2 via ORAL
  Filled 2022-12-29 (×2): qty 2

## 2022-12-29 NOTE — Progress Notes (Signed)
Unfortunately, he has recurrence of the left pleural effusion.  He is having shortness of breath.  He is having more chest pain.  He really needs to have a thoracentesis today.  Hopefully, the chemotherapy, since he is getting right now, we will be able to help eradicate the fluid.  So far, he is tolerated the treatment pretty well.  He has had no nausea or vomiting.  His appetite is okay.  He has had no problems with his bladder.  He is little bit constipated.  There are no labs back yet today.  He has had no bleeding.  He has had no leg swelling.  He is ambulating but with shortness of breath.  Again, I thought we would be able to let him go home today after chemotherapy.  Unfortunately, I think he is going to have to be watched for day.  Hopefully he can have the thoracentesis today.  He will then be able to go home tomorrow.  He will come into the office on Monday for his Neulasta.  Infectious Disease is following closely.  They will help monitor the HIV.  I do appreciate the great care he is getting from all the staff up on 6 E.   Christin Bach, MD  Proverbs 3:5-6

## 2022-12-29 NOTE — Plan of Care (Signed)
Problem: Activity: Goal: Ability to tolerate increased activity will improve Outcome: Progressing   Problem: Education: Goal: Knowledge of General Education information will improve Description: Including pain rating scale, medication(s)/side effects and non-pharmacologic comfort measures Outcome: Progressing   Problem: Nutrition: Goal: Adequate nutrition will be maintained Outcome: Progressing

## 2022-12-29 NOTE — Plan of Care (Signed)
Problem: Clinical Measurements: Goal: Ability to maintain a body temperature in the normal range will improve Outcome: Progressing   Problem: Respiratory: Goal: Ability to maintain a clear airway will improve Outcome: Progressing   Problem: Education: Goal: Knowledge of General Education information will improve Description: Including pain rating scale, medication(s)/side effects and non-pharmacologic comfort measures Outcome: Progressing   Problem: Health Behavior/Discharge Planning: Goal: Ability to manage health-related needs will improve Outcome: Progressing   Problem: Clinical Measurements: Goal: Ability to maintain clinical measurements within normal limits will improve Outcome: Progressing Goal: Will remain free from infection Outcome: Progressing Goal: Diagnostic test results will improve Outcome: Progressing Goal: Respiratory complications will improve Outcome: Progressing Goal: Cardiovascular complication will be avoided Outcome: Progressing   Problem: Activity: Goal: Risk for activity intolerance will decrease Outcome: Progressing   Problem: Nutrition: Goal: Adequate nutrition will be maintained Outcome: Progressing   Problem: Coping: Goal: Level of anxiety will decrease Outcome: Progressing   Problem: Elimination: Goal: Will not experience complications related to bowel motility Outcome: Progressing Goal: Will not experience complications related to urinary retention Outcome: Progressing   Problem: Pain Managment: Goal: General experience of comfort will improve Outcome: Progressing   Problem: Safety: Goal: Ability to remain free from injury will improve Outcome: Progressing   Problem: Skin Integrity: Goal: Risk for impaired skin integrity will decrease Outcome: Progressing

## 2022-12-29 NOTE — Plan of Care (Signed)

## 2022-12-29 NOTE — Procedures (Signed)
Ultrasound-guided diagnostic and therapeutic left thoracentesis performed yielding 1 liter of hazy, blood-tinged fluid. No immediate complications. Follow-up chest x-ray pending. The fluid was submitted to the lab for preordered studies. EBL< 2 cc.

## 2022-12-29 NOTE — Progress Notes (Signed)
PROGRESS NOTE  Christopher Henson GNF:[id number] DOB: [birth year]   PCP: [name], PA-C  Patient is from: Home.  DOA: 12/18/2022 LOS: 11  Chief complaints Chief Complaint  Patient presents with   Chest Pain     Brief Narrative / Interim history: 42 year old M with PMH of HIV/AIDS,  cryptococcal meningitis, alcohol use and tobacco use presenting with chest pain, shortness of breath, generalized weakness and unintentional weight loss, and admitted for anterior mediastinal mass.  Patient stated he has not been able to take his antiviral medications for about a week due to cost.  He was seen in ED on 9/3 and had chest x-ray that was concerning for mediastinal mass.  He was recommended to have CT but he left AMA.    In ED, CT angio chest negative for PE but 8 x 7.5 cm anterior mediastinal mass, 1.6 x 1.2 cm LUL nodule, interval increase in peribronchovascular groundglass airspace opacities within LUL and interval increase in size of small left pleural effusion.  CT abdomen and pelvis without significant finding.  Pulmonology and infectious disease consulted.  He underwent left thoracocentesis with removal of 800 cc exudative fluid.   Pleural fluid culture NGTD.  Cytology with malignant cells.  Underwent bronchoscopy on 9/12.  Biopsy consistent with malignancy.  Underwent bone marrow biopsy on 9/18.  Had a port a cath placed on 9/18 and started chemotherapy as well.  Patient also had elevated serum cryptococcal antigen for which she was started on Diflucan.  Underwent lumbar puncture on 9/13 with opening pressure of 21 cm water.  Fluid study negative for cryptococcal antigen.  Continued on Diflucan per ID.   Patient developed shortness of breath with increased oxygen requirement and left-sided chest pain.  Chest x-ray showed left-sided pleural effusion again.  Underwent thoracocentesis with removal of 1 L fluid on 9/20.  Subjective: Seen and examined earlier this morning after he returned from  thoracocentesis.  Reports improvement in his breathing after thoracocentesis.  Still with some shortness of breath.  Patient sister at bedside.  Objective: Vitals:   12/29/22 0937 12/29/22 0955 12/29/22 1248 12/29/22 1358  BP: (!) 155/99 (!) 156/114 (!) 165/97 (!) 140/97  Pulse:   99 (!) 104  Resp:   18 20  Temp:   98 F (36.7 C) 97.7 F (36.5 C)  TempSrc:   Oral Oral  SpO2:   91% 92%  Weight:      Height:        Examination:  GENERAL: No apparent distress.  Nontoxic. HEENT: MMM.  Vision and hearing grossly intact.  NECK: Supple.  No apparent JVD.  RESP:  No IWOB.  Diminished aeration at the left lung base. CVS:  RRR. Heart sounds normal.  ABD/GI/GU: BS+. Abd soft, NTND.  MSK/EXT:  Moves extremities. No apparent deformity. No edema.  SKIN: no apparent skin lesion or wound NEURO: Awake, alert and oriented appropriately.  No apparent focal neuro deficit. PSYCH: Calm. Normal affect.   Procedures:  9/9-thoracocentesis with removal of 800 cc exudative fluid 9/12-bronchoscopy 9/13-lumbar puncture 9/18-port a cath placement 9/20-left-sided thoracocentesis with removal of 1000 cc fluid  Microbiology summarized: 9/9-COVID-19, influenza and RSV PCR nonreactive 9/9-blood culture with staph epidermis in 1 out of 4 bottles 9/9-pleural fluid cultures NGTD  Assessment and plan: Principal Problem:   Small cell lung cancer, left (HCC) Active Problems:   HIV (human immunodeficiency virus infection) (HCC)   Pleural effusion on left   Hypokalemia   Alcoholism (HCC)   Smoker  Cryptococcal meningitis (HCC)   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  Pleural fluid with malignant cells.  Underwent bronchoscopy.  Pathology showed lung cancer.  MRI brain without acute finding.  Port a cath placed on 9/18, started chemotherapy. -Continue chemotherapy per oncology -Monitor labs. -Continue fentanyl patch and  oxycodone for pain  Acute respiratory failure with hypoxia: Due to the above.  Had acute dyspnea with increased oxygen requirement on 9/19.  CXR with recurrent left pleural effusion. -S/p repeat thoracocentesis with removal of 1 L on 9/20. -Encouraged and gave him incentive spirometry -Pain control as above. -Wean oxygen as able   HIV: CD4 count 258.  Noncompliant with meds. -Appreciate ID recs-continue Biktarvy, Diflucan and Bactrim   Serum positive cryptococcal antigen: Underwent fluoroscopy guided LP with opening pressure of 21 cm water.  Fluid study negative for cryptococcal antigen. -ID recommends continuing Diflucan until outpatient follow-up on 10/16.   Anxiety: Stable -Continue Ativan as needed   Tobacco abuse: -Continue nicotine patch   Alcohol use: No signs of withdrawal -continue thiamine and folate.   Noncompliance: -Has been counseled.  Mild elevated LFT: Improved.   Hypokalemia: -Monitor replenish as appropriate   Blood cultures positive: for Staph epidermidis 1 out of 4 likely contaminant.  Leukocytosis: Likely demargination from steroid  Body mass index is 23.59 kg/m.           DVT prophylaxis:  enoxaparin (LOVENOX) injection 40 mg Start: 12/28/22 1000  Code Status: Full code Family Communication: None at bedside Level of care: Med-Surg Status is: Inpatient Remains inpatient appropriate because: Lung cancer, respiratory failure and recurrent pleural effusion   Final disposition: Home Consultants:  Pulmonology Infectious disease Interventional radiology Oncology  35 minutes with more than 50% spent in reviewing records, counseling patient/family and coordinating care.   Sch Meds:  Scheduled Meds:  bictegravir-emtricitabine-tenofovir AF  1 tablet Oral Daily   Chlorhexidine Gluconate Cloth  6 each Topical Daily   dronabinol  5 mg Oral BID AC   enoxaparin (LOVENOX) injection  40 mg Subcutaneous Q24H   feeding supplement  237 mL Oral BID  BM   fentaNYL  1 patch Transdermal Q72H   fluconazole  200 mg Oral Daily   sulfamethoxazole-trimethoprim  1 tablet Oral Daily   Continuous Infusions:  sodium chloride     PRN Meds:.alum & mag hydroxide-simeth, ipratropium-albuterol, LORazepam, ondansetron **OR** ondansetron (ZOFRAN) IV, mouth rinse, oxyCODONE  Antimicrobials: Anti-infectives (From admission, onward)    Start     Dose/Rate Route Frequency Ordered Stop   12/25/22 1145  sulfamethoxazole-trimethoprim (BACTRIM DS) 800-160 MG per tablet 1 tablet        1 tablet Oral Daily 12/25/22 1057     12/24/22 1000  Ampicillin-Sulbactam (UNASYN) 3 g in sodium chloride 0.9 % 100 mL IVPB        3 g 200 mL/hr over 30 Minutes Intravenous Every 6 hours 12/24/22 0929 12/25/22 1900   12/22/22 2030  fluconazole (DIFLUCAN) tablet 200 mg        200 mg Oral Daily 12/22/22 1915     12/18/22 2300  bictegravir-emtricitabine-tenofovir AF (BIKTARVY) 50-200-25 MG per tablet 1 tablet        1 tablet Oral Daily 12/18/22 1910     12/18/22 1100  Ampicillin-Sulbactam (UNASYN) 3 g in sodium chloride 0.9 % 100 mL IVPB  Status:  Discontinued        3 g 200 mL/hr over 30 Minutes Intravenous Every 6 hours 12/18/22  1054 12/24/22 0929   12/18/22 0500  sulfamethoxazole-trimethoprim (BACTRIM) 415 mg of trimethoprim in dextrose 5 % 500 mL IVPB  Status:  Discontinued        415 mg of trimethoprim 350.6 mL/hr over 90 Minutes Intravenous Every 6 hours 12/18/22 0427 12/18/22 1051        I have personally reviewed the following labs and images: CBC: Recent Labs  Lab 12/25/22 0542 12/26/22 0832 12/27/22 1430 12/28/22 0449 12/29/22 0600  WBC 7.0 7.9 11.1* 17.5* 29.5*  NEUTROABS 3.8 4.6 8.3* 15.9* 27.3*  HGB 16.7 16.6 15.6 16.4 15.9  HCT 47.9 48.0 44.6 46.6 45.4  MCV 92.1 90.1 89.9 90.5 90.4  PLT 236 249 246 273 270   BMP &GFR Recent Labs  Lab 12/24/22 0944 12/26/22 0832 12/27/22 1430 12/28/22 0449 12/29/22 0600  NA 137 138 139 138 140  K 3.7 3.9  4.2 4.1 4.5  CL 103 104 104 105 106  CO2 24 25 24  21* 24  GLUCOSE 97 89 100* 129* 123*  BUN 12 13 13 16 17   CREATININE 1.15 1.15 1.01 1.01 0.97  CALCIUM 9.0 9.2 9.2 9.6 9.5   Estimated Creatinine Clearance: 106.7 mL/min (by C-G formula based on SCr of 0.97 mg/dL). Liver & Pancreas: Recent Labs  Lab 12/24/22 0944 12/26/22 0832 12/27/22 1430 12/28/22 0449 12/29/22 0600  AST 18 22 24  41 34  ALT 22 32 32 49* 54*  ALKPHOS 67 71 77 91 102  BILITOT 0.5 0.7 0.8 0.9 0.9  PROT 7.0 7.1 7.1 8.0 7.7  ALBUMIN 3.8 4.0 3.7 4.2 3.8   No results for input(s): "LIPASE", "AMYLASE" in the last 168 hours. No results for input(s): "AMMONIA" in the last 168 hours. Diabetic: No results for input(s): "HGBA1C" in the last 72 hours. No results for input(s): "GLUCAP" in the last 168 hours. Cardiac Enzymes: No results for input(s): "CKTOTAL", "CKMB", "CKMBINDEX", "TROPONINI" in the last 168 hours. No results for input(s): "PROBNP" in the last 8760 hours. Coagulation Profile: No results for input(s): "INR", "PROTIME" in the last 168 hours.  Thyroid Function Tests: No results for input(s): "TSH", "T4TOTAL", "FREET4", "T3FREE", "THYROIDAB" in the last 72 hours. Lipid Profile: No results for input(s): "CHOL", "HDL", "LDLCALC", "TRIG", "CHOLHDL", "LDLDIRECT" in the last 72 hours.  Anemia Panel: No results for input(s): "VITAMINB12", "FOLATE", "FERRITIN", "TIBC", "IRON", "RETICCTPCT" in the last 72 hours. Urine analysis:    Component Value Date/Time   COLORURINE STRAW (A) 12/18/2022 0552   APPEARANCEUR CLEAR 12/18/2022 0552   LABSPEC 1.025 12/18/2022 0552   PHURINE 7.0 12/18/2022 0552   GLUCOSEU NEGATIVE 12/18/2022 0552   HGBUR NEGATIVE 12/18/2022 0552   BILIRUBINUR NEGATIVE 12/18/2022 0552   KETONESUR NEGATIVE 12/18/2022 0552   PROTEINUR NEGATIVE 12/18/2022 0552   NITRITE NEGATIVE 12/18/2022 0552   LEUKOCYTESUR NEGATIVE 12/18/2022 0552   Sepsis Labs: Invalid input(s): "PROCALCITONIN",  "LACTICIDVEN"  Microbiology: Recent Results (from the past 240 hour(s))  Fungus Culture With Stain     Status: None (Preliminary result)   Collection Time: 12/21/22  2:05 PM   Specimen: Bronchial Alveolar Lavage; Respiratory  Result Value Ref Range Status   Fungus Stain Final report  Final    Comment: (NOTE) Performed At: [facility] [address] [place], [place] [id number] Jolene Schimke MD NW:[id number]    Fungus (Mycology) Culture PENDING  Incomplete   Fungal Source BRONCHIAL ALVEOLAR LAVAGE  Final    Comment: LEFT UPPER LOBE Performed at [facility], [address] [place], [place] [address]  Fungus Culture With Stain     Status: None (Preliminary result)   Collection Time: 12/21/22  2:05 PM   Specimen: Bronchial Alveolar Lavage; Respiratory  Result Value Ref Range Status   Fungus Stain Final report  Final    Comment: (NOTE) Performed At: [facility] [address] [place], [place] [id number] Jolene Schimke MD OZ:[id number]    Fungus (Mycology) Culture PENDING  Incomplete   Fungal Source BRONCHIAL ALVEOLAR LAVAGE  Final    Comment: LEFT LOWER LOBE Performed at [facility], [address], [place], [place] [address]   Culture, Respiratory w Gram Stain     Status: None   Collection Time: 12/21/22  2:05 PM   Specimen: Bronchial Alveolar Lavage; Respiratory  Result Value Ref Range Status   Specimen Description   Final    BRONCHIAL ALVEOLAR LAVAGE LEFT UPPER LOBE Performed at [facility], [address] [place], [place] [address]    Special Requests   Final    BRONCHIAL ALVEOLAR LAVAGE LEFT UPPER LOBE Performed at [facility], [address] [place], [place] [address]    Gram Stain   Final    RARE WBC PRESENT, PREDOMINANTLY MONONUCLEAR NO ORGANISMS SEEN    Culture   Final    NO GROWTH 3 DAYS Performed at [facility], [address], [place], [place]  [address]    Report Status 12/25/2022 FINAL  Final  Culture, Respiratory w Gram Stain     Status: None   Collection Time: 12/21/22  2:05 PM   Specimen: Bronchial Alveolar Lavage; Respiratory  Result Value Ref Range Status   Specimen Description   Final    BRONCHIAL ALVEOLAR LAVAGE LEFT LOWER LOBE Performed at [facility], [address], [place], [place] [address]    Special Requests   Final    BRONCHIAL ALVEOLAR LAVAGE LEFT LOWER LOBE Performed at [facility], [address] [place], [place] [address]    Gram Stain NO WBC SEEN NO ORGANISMS SEEN   Final   Culture   Final    NO GROWTH 3 DAYS Performed at [facility], [address] [place], [place] [address]    Report Status 12/25/2022 FINAL  Final  Acid Fast Smear (AFB)     Status: None   Collection Time: 12/21/22  2:05 PM   Specimen: Bronchial Alveolar Lavage; Respiratory  Result Value Ref Range Status   AFB Specimen Processing Concentration  Final   Acid Fast Smear Negative  Final    Comment: (NOTE) Performed At: [facility] [address] , [place] [id number] Jolene Schimke MD JJ:[id number]    Source (AFB) BRONCHIAL ALVEOLAR LAVAGE  Final    Comment: LEFT UPPER LOBE Performed at [facility], [address], [place], [place] [address]   Acid Fast Smear (AFB)     Status: None   Collection Time: 12/21/22  2:05 PM   Specimen: Bronchial Alveolar Lavage; Respiratory  Result Value Ref Range Status   AFB Specimen Processing Concentration  Final   Acid Fast Smear Negative  Final    Comment: (NOTE) Performed At: [facility] [address] [place], [place] [id number] Jolene Schimke MD KG:[id number]    Source (AFB) BRONCHIAL ALVEOLAR LAVAGE  Final    Comment:  LEFT LOWER LOBE Performed at [facility], [address], [place], [place] [address]   Anaerobic culture w Gram Stain     Status: None  Collection Time:  12/21/22  2:05 PM   Specimen: Bronchoalveolar Lavage  Result Value Ref Range Status   Specimen Description BRONCHIAL ALVEOLAR LAVAGE  Final   Special Requests LEFT LOWER LOBE  Final   Gram Stain   Final    WBC PRESENT, PREDOMINANTLY MONONUCLEAR NO ORGANISMS SEEN CYTOSPIN SMEAR    Culture   Final    NO ANAEROBES ISOLATED Performed at [facility], [address], [place], [place] [address]    Report Status 12/26/2022 FINAL  Final  Anaerobic culture w Gram Stain     Status: None   Collection Time: 12/21/22  2:05 PM   Specimen: Bronchoalveolar Lavage  Result Value Ref Range Status   Specimen Description BRONCHIAL ALVEOLAR LAVAGE  Final   Special Requests LEFT UPPER LOBE  Final   Gram Stain   Final    WBC PRESENT, PREDOMINANTLY MONONUCLEAR NO ORGANISMS SEEN CYTOSPIN SMEAR    Culture   Final    NO ANAEROBES ISOLATED Performed at [facility], [address], [place], [place] [address]    Report Status 12/26/2022 FINAL  Final  Fungus Culture Result     Status: None   Collection Time: 12/21/22  2:05 PM  Result Value Ref Range Status   Result 1 Comment  Final    Comment: (NOTE) KOH/Calcofluor preparation:  no fungus observed. Performed At: [facility] [address] [place], [place] [id number] Jolene Schimke MD GM:[id number]   Fungus Culture Result     Status: None   Collection Time: 12/21/22  2:05 PM  Result Value Ref Range Status   Result 1 Comment  Final    Comment: (NOTE) KOH/Calcofluor preparation:  no fungus observed. Performed At: [facility] [address] [place], [place] [id number] Jolene Schimke MD ZD:[id number]   CSF culture w Gram Stain     Status: None   Collection Time: 12/22/22 12:34 PM   Specimen: CSF; Cerebrospinal Fluid  Result Value Ref Range Status   Specimen Description   Final    CSF Performed at [facility], [address], [place], [place] [address]    Special Requests   Final     Normal Performed at [facility], [address] [place], [place] [address]    Gram Stain NO WBC SEEN NO ORGANISMS SEEN CYTOSPIN SMEAR   Final   Culture   Final    NO GROWTH 3 DAYS Performed at [facility], [address] [place], [place] [address]    Report Status 12/25/2022 FINAL  Final    Radiology Studies: US THORACENTESIS ASP PLEURAL SPACE W/IMG GUIDE  Result Date: 12/29/2022 INDICATION: Patient with history of HIV/AIDS, prior cryptococcal meningitis, extensive small cell lung cancer, dyspnea, left pleural effusion. Request received for diagnostic and therapeutic left thoracentesis. EXAM: ULTRASOUND GUIDED DIAGNOSTIC AND THERAPEUTIC LEFT THORACENTESIS MEDICATIONS: 8 mL 1% lidocaine COMPLICATIONS: None immediate. PROCEDURE: An ultrasound guided thoracentesis was thoroughly discussed with the patient and questions answered. The benefits, risks, alternatives and complications were also discussed. The patient understands and wishes to proceed with the procedure. Written consent was obtained. Ultrasound was performed to localize and mark an adequate pocket of fluid in the left chest. The area was then prepped and draped in the normal sterile fashion. 1% Lidocaine was used for local anesthesia. Under ultrasound guidance a 6 Fr Safe-T-Centesis catheter was introduced. Thoracentesis was performed. The catheter was removed and a dressing applied. FINDINGS: A total of approximately 1 liter of  hazy, blood-tinged fluid was removed. Samples were sent to the laboratory as requested by the clinical team. IMPRESSION: Successful ultrasound guided diagnostic and therapeutic left thoracentesis yielding 1 liter of pleural fluid. Performed by: Artemio Aly Electronically Signed   By: Marliss Coots M.D.   On: 12/29/2022 12:27   DG CHEST PORT 1 VIEW  Result Date: 12/29/2022 CLINICAL DATA:  [id number] Status post thoracentesis [id number] EXAM: PORTABLE CHEST 1 VIEW COMPARISON:  CXR 12/28/22  FINDINGS: Small left pleural effusion, slightly decreased from prior exam. Unchanged cardiac and mediastinal contours. Unchanged appearance of the left mediastinal mass. No pneumothorax. No radiographically apparent displaced rib fractures. Visualized upper abdomen unremarkable. Right-sided chest port in place with the tip near the cavoatrial junction. IMPRESSION: Small left pleural effusion, slightly decreased from prior exam. No pneumothorax. Electronically Signed   By: Lorenza Cambridge M.D.   On: 12/29/2022 10:55   DG Chest Port 1 View  Result Date: 12/28/2022 CLINICAL DATA:  Shortness of breath EXAM: PORTABLE CHEST 1 VIEW COMPARISON:  12/27/2022, CT from 12/18/2022 FINDINGS: Cardiac shadow is stable. Mediastinal mass is again noted on the left lung along the aortic knob. Right chest wall port is noted in satisfactory position. Left pleural effusion is noted similar to that seen on prior chest CT. IMPRESSION: Stable appearance of the chest with mediastinal mass and left pleural effusion. Electronically Signed   By: Alcide Clever M.D.   On: 12/28/2022 21:43      Cereniti Curb T. Mykenzi Vanzile [facility]  If 7PM-7AM, please contact night-coverage [email or web address] 12/29/2022, 3:01 PM

## 2022-12-29 NOTE — Progress Notes (Signed)
Per Dr [name], request for Foundation One testing sent on specimen [id number] DOS 12/21/2022.  Oncology Nurse Navigator Documentation     12/29/2022    3:30 PM  Oncology Nurse Navigator Flowsheets  Navigator Location [facility]  Navigator Encounter Type Molecular Studies  Treatment Phase Active Tx  Barriers/Navigation Needs Coordination of Care  Interventions Coordination of Care  Acuity Level 2-Minimal Needs (1-2 Barriers Identified)  Coordination of Care Pathology  Time Spent with Patient 30

## 2022-12-30 DIAGNOSIS — F17200 Nicotine dependence, unspecified, uncomplicated: Secondary | ICD-10-CM | POA: Diagnosis not present

## 2022-12-30 DIAGNOSIS — J90 Pleural effusion, not elsewhere classified: Secondary | ICD-10-CM | POA: Diagnosis not present

## 2022-12-30 DIAGNOSIS — C3492 Malignant neoplasm of unspecified part of left bronchus or lung: Secondary | ICD-10-CM | POA: Diagnosis not present

## 2022-12-30 DIAGNOSIS — E876 Hypokalemia: Secondary | ICD-10-CM | POA: Diagnosis not present

## 2022-12-30 LAB — COMPREHENSIVE METABOLIC PANEL
ALT: 55 U/L — ABNORMAL HIGH (ref 0–44)
AST: 31 U/L (ref 15–41)
Albumin: 3.6 g/dL (ref 3.5–5.0)
Alkaline Phosphatase: 93 U/L (ref 38–126)
Anion gap: 11 (ref 5–15)
BUN: 18 mg/dL (ref 6–20)
CO2: 24 mmol/L (ref 22–32)
Calcium: 9.3 mg/dL (ref 8.9–10.3)
Chloride: 101 mmol/L (ref 98–111)
Creatinine, Ser: 0.94 mg/dL (ref 0.61–1.24)
GFR, Estimated: >60 mL/min (ref >60)
Glucose, Bld: 134 mg/dL — ABNORMAL HIGH (ref 70–99)
Potassium: 4 mmol/L (ref 3.5–5.1)
Sodium: 136 mmol/L (ref 135–145)
Total Bilirubin: 0.9 mg/dL (ref 0.3–1.2)
Total Protein: 7.1 g/dL (ref 6.5–8.1)

## 2022-12-30 LAB — CBC WITH DIFFERENTIAL/PLATELET
Abs Immature Granulocytes: 0.19 10*3/uL — ABNORMAL HIGH (ref 0.00–0.07)
Basophils Absolute: 0 10*3/uL (ref 0.0–0.1)
Basophils Relative: 0 %
Eosinophils Absolute: 0 10*3/uL (ref 0.0–0.5)
Eosinophils Relative: 0 %
HCT: 44.7 % (ref 39.0–52.0)
Hemoglobin: 15.8 g/dL (ref 13.0–17.0)
Immature Granulocytes: 1 %
Lymphocytes Relative: 3 %
Lymphs Abs: 0.7 10*3/uL (ref 0.7–4.0)
MCH: 31.4 pg (ref 26.0–34.0)
MCHC: 35.3 g/dL (ref 30.0–36.0)
MCV: 88.9 fL (ref 80.0–100.0)
Monocytes Absolute: 0.1 10*3/uL (ref 0.1–1.0)
Monocytes Relative: 1 %
Neutro Abs: 21 10*3/uL — ABNORMAL HIGH (ref 1.7–7.7)
Neutrophils Relative %: 95 %
Platelets: 267 10*3/uL (ref 150–400)
RBC: 5.03 MIL/uL (ref 4.22–5.81)
RDW: 13.3 % (ref 11.5–15.5)
WBC: 22 10*3/uL — ABNORMAL HIGH (ref 4.0–10.5)
nRBC: 0 % (ref 0.0–0.2)

## 2022-12-30 MED ORDER — SODIUM CHLORIDE 0.9 % IV SOLN
12.5000 mg | Freq: Three times a day (TID) | INTRAVENOUS | Status: DC | PRN
  Administered 2023-01-01: 12.5 mg via INTRAVENOUS
  Filled 2022-12-30: qty 0.5

## 2022-12-30 MED ORDER — LACTULOSE 10 GM/15ML PO SOLN
20.0000 g | Freq: Two times a day (BID) | ORAL | Status: DC
  Administered 2022-12-30 – 2022-12-31 (×4): 20 g via ORAL
  Filled 2022-12-30 (×4): qty 30

## 2022-12-30 MED ORDER — SENNOSIDES-DOCUSATE SODIUM 8.6-50 MG PO TABS
1.0000 | ORAL_TABLET | Freq: Two times a day (BID) | ORAL | Status: DC
  Administered 2022-12-30 – 2023-01-03 (×7): 1 via ORAL
  Filled 2022-12-30 (×9): qty 1

## 2022-12-30 MED ORDER — BISACODYL 5 MG PO TBEC
10.0000 mg | DELAYED_RELEASE_TABLET | Freq: Every day | ORAL | Status: DC | PRN
  Administered 2022-12-30: 10 mg via ORAL
  Filled 2022-12-30 (×2): qty 2

## 2022-12-30 MED ORDER — SODIUM CHLORIDE 0.9 % IV SOLN
Freq: Once | INTRAVENOUS | Status: AC
  Filled 2022-12-30: qty 5

## 2022-12-30 NOTE — Progress Notes (Signed)
Mr. Malatesta is still having some problems.  He did have a thoracentesis yesterday.  3000 cc of fluid was taken off.  He still does not feel all much better.  He has nausea.  I will give him a dose of Emend today.  We will see if this helps with his nausea.  He has had no fever.  He has had no bleeding.  His pain might be a little bit better control.  His labs show sodium 136.  Potassium 4.  BUN 18 creatinine 0.94.  His albumin is 3.6.  His white cell count is 22.  Hemoglobin 15.8.  Platelet count 267,000.  Thankfully, the bone marrow biopsy came back negative for marrow involvement by the small cell lung cancer.  He has not gone to the bathroom.  He really wants to have a bowel movement.  I will put him on some lactulose.  This should be able to get him going.  I just do not think that he is able to go home right now.  He just does not feel that great.  He really needs to go to the bathroom.  Maybe, if we get him going, he will feel better.  Again we will give her some Emend to help with the nausea.  I know that he is getting great care follow-up with staff upon 6 E.   Christin Bach, MD  Jeri Modena 17:14

## 2022-12-30 NOTE — Progress Notes (Signed)
PROGRESS NOTE  Christopher Henson WUJ:[id number] DOB: Sep 08, 1980   PCP: [name], PA-C  Patient is from: Home.  DOA: 12/18/2022 LOS: 12  Chief complaints Chief Complaint  Patient presents with   Chest Pain     Brief Narrative / Interim history: 42 year old M with PMH of HIV/AIDS,  cryptococcal meningitis, alcohol use and tobacco use presenting with chest pain, shortness of breath, generalized weakness and unintentional weight loss, and admitted for anterior mediastinal mass.  Patient stated he has not been able to take his antiviral medications for about a week due to cost.  He was seen in ED on 9/3 and had chest x-ray that was concerning for mediastinal mass.  He was recommended to have CT but he left AMA.    In ED, CT angio chest negative for PE but 8 x 7.5 cm anterior mediastinal mass, 1.6 x 1.2 cm LUL nodule, interval increase in peribronchovascular groundglass airspace opacities within LUL and interval increase in size of small left pleural effusion.  CT abdomen and pelvis without significant finding.  Pulmonology and infectious disease consulted.  He underwent left thoracocentesis with removal of 800 cc exudative fluid.   Pleural fluid culture NGTD.  Cytology with malignant cells.  Underwent bronchoscopy on 9/12.  Biopsy consistent with malignancy.  Underwent bone marrow biopsy on 9/18.  Had a port a cath placed on 9/18 and started chemotherapy as well.  Patient also had elevated serum cryptococcal antigen for which she was started on Diflucan.  Underwent lumbar puncture on 9/13 with opening pressure of 21 cm water.  Fluid study negative for cryptococcal antigen.  Continued on Diflucan per ID.   Patient developed shortness of breath with increased oxygen requirement and left-sided chest pain.  Chest x-ray showed left-sided pleural effusion again.  Underwent thoracocentesis with removal of 1 L fluid on 9/20.  Subjective: Seen and examined earlier this morning.  No major events  overnight of this morning.  Continues to endorse shortness of breath and pain in the left chest with deep breathing.  Also reports hiccups and constipation.  Feels fatigued.  Reports working on incentive spirometry.  Remains on 4 L by nasal cannula.  Objective: Vitals:   12/29/22 1358 12/29/22 2048 12/30/22 0455 12/30/22 1354  BP: (!) 140/97 (!) 157/107 (!) 159/112 (!) 140/104  Pulse: (!) 104 99 93 89  Resp: 20 16 16 20   Temp: 97.7 F (36.5 C) 99.2 F (37.3 C) 98.1 F (36.7 C) 98.3 F (36.8 C)  TempSrc: Oral Oral Oral Oral  SpO2: 92% 95% 91% 93%  Weight:      Height:        Examination:  GENERAL: No apparent distress.  Nontoxic. HEENT: MMM.  Vision and hearing grossly intact.  NECK: Supple.  No apparent JVD.  RESP:  No IWOB.  Diminished aeration at the left lung base. CVS:  RRR. Heart sounds normal.  ABD/GI/GU: BS+. Abd soft, NTND.  MSK/EXT:  Moves extremities. No apparent deformity. No edema.  SKIN: no apparent skin lesion or wound NEURO: Awake, alert and oriented appropriately.  No apparent focal neuro deficit. PSYCH: Calm. Normal affect.   Procedures:  9/9-thoracocentesis with removal of 800 cc exudative fluid 9/12-bronchoscopy 9/13-lumbar puncture 9/18-port a cath placement 9/20-left-sided thoracocentesis with removal of 1000 cc fluid  Microbiology summarized: 9/9-COVID-19, influenza and RSV PCR nonreactive 9/9-blood culture with staph epidermis in 1 out of 4 bottles 9/9-pleural fluid cultures NGTD  Assessment and plan: Principal Problem:   Small cell lung cancer, left (HCC)  Active Problems:   HIV (human immunodeficiency virus infection) (HCC)   Pleural effusion on left   Hypokalemia   Alcoholism (HCC)   Smoker   Cryptococcal meningitis (HCC)   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  Pleural fluid with malignant cells.  Underwent bronchoscopy.  Pathology showed lung cancer.  MRI  brain without acute finding.  Port a cath placed on 9/18, started chemotherapy. -Continue chemotherapy per oncology -Continue fentanyl patch and oxycodone for pain -Monitor labs.  Acute respiratory failure with hypoxia: Due to the above.  Had acute dyspnea with increased oxygen requirement on 9/19.  CXR with recurrent left pleural effusion. -S/p repeat thoracocentesis with removal of 1 L on 9/20. -Encouraged working on incentive spirometry -Pain control as above. -Wean oxygen as able -Consider repeat chest x-ray if no improvement   HIV: CD4 count 258.  Noncompliant with meds. -Appreciate ID recs-continue Biktarvy, Diflucan and Bactrim   Serum positive cryptococcal antigen: Underwent fluoroscopy guided LP with opening pressure of 21 cm water.  Fluid study negative for cryptococcal antigen. -ID recommends continuing Diflucan until outpatient follow-up on 10/16.   Anxiety: Stable -Continue Ativan as needed   Tobacco abuse: -Continue nicotine patch   Alcohol use: No signs of withdrawal -continue thiamine and folate.   Noncompliance: -Has been counseled.  Mild elevated LFT: Improved.   Hypokalemia: -Monitor replenish as appropriate   Blood cultures positive: for Staph epidermidis 1 out of 4 likely contaminant.  Leukocytosis: Likely demargination from steroid  Constipation -Bowel regimen-scheduled Senokot-S and lactulose  Hiccups -Ordered IV Thorazine as needed  Body mass index is 23.59 kg/m.           DVT prophylaxis:  enoxaparin (LOVENOX) injection 40 mg Start: 12/28/22 1000  Code Status: Full code Family Communication: None at bedside Level of care: Med-Surg Status is: Inpatient Remains inpatient appropriate because: Lung cancer, respiratory failure and recurrent pleural effusion   Final disposition: Home Consultants:  Pulmonology Infectious disease Interventional radiology Oncology  35 minutes with more than 50% spent in reviewing records,  counseling patient/family and coordinating care.   Sch Meds:  Scheduled Meds:  bictegravir-emtricitabine-tenofovir AF  1 tablet Oral Daily   Chlorhexidine Gluconate Cloth  6 each Topical Daily   dronabinol  5 mg Oral BID AC   enoxaparin (LOVENOX) injection  40 mg Subcutaneous Q24H   feeding supplement  237 mL Oral BID BM   fentaNYL  1 patch Transdermal Q72H   fluconazole  200 mg Oral Daily   lactulose  20 g Oral BID   sulfamethoxazole-trimethoprim  1 tablet Oral Daily   Continuous Infusions:  sodium chloride     chlorproMAZINE (THORAZINE) 12.5 mg in sodium chloride 0.9 % 25 mL IVPB     PRN Meds:.alum & mag hydroxide-simeth, bisacodyl, chlorproMAZINE (THORAZINE) 12.5 mg in sodium chloride 0.9 % 25 mL IVPB, ipratropium-albuterol, LORazepam, ondansetron **OR** ondansetron (ZOFRAN) IV, mouth rinse, oxyCODONE, senna-docusate  Antimicrobials: Anti-infectives (From admission, onward)    Start     Dose/Rate Route Frequency Ordered Stop   12/25/22 1145  sulfamethoxazole-trimethoprim (BACTRIM DS) 800-160 MG per tablet 1 tablet        1 tablet Oral Daily 12/25/22 1057     12/24/22 1000  Ampicillin-Sulbactam (UNASYN) 3 g in sodium chloride 0.9 % 100 mL IVPB        3 g 200 mL/hr over 30 Minutes Intravenous Every 6 hours 12/24/22 0929 12/25/22 1900   12/22/22 2030  fluconazole (DIFLUCAN) tablet  200 mg        200 mg Oral Daily 12/22/22 1915     12/18/22 2300  bictegravir-emtricitabine-tenofovir AF (BIKTARVY) 50-200-25 MG per tablet 1 tablet        1 tablet Oral Daily 12/18/22 1910     12/18/22 1100  Ampicillin-Sulbactam (UNASYN) 3 g in sodium chloride 0.9 % 100 mL IVPB  Status:  Discontinued        3 g 200 mL/hr over 30 Minutes Intravenous Every 6 hours 12/18/22 1054 12/24/22 0929   12/18/22 0500  sulfamethoxazole-trimethoprim (BACTRIM) 415 mg of trimethoprim in dextrose 5 % 500 mL IVPB  Status:  Discontinued        415 mg of trimethoprim 350.6 mL/hr over 90 Minutes Intravenous Every 6 hours  12/18/22 0427 12/18/22 1051        I have personally reviewed the following labs and images: CBC: Recent Labs  Lab 12/26/22 0832 12/27/22 1430 12/28/22 0449 12/29/22 0600 12/30/22 0525  WBC 7.9 11.1* 17.5* 29.5* 22.0*  NEUTROABS 4.6 8.3* 15.9* 27.3* 21.0*  HGB 16.6 15.6 16.4 15.9 15.8  HCT 48.0 44.6 46.6 45.4 44.7  MCV 90.1 89.9 90.5 90.4 88.9  PLT 249 246 273 270 267   BMP &GFR Recent Labs  Lab 12/26/22 0832 12/27/22 1430 12/28/22 0449 12/29/22 0600 12/30/22 0525  NA 138 139 138 140 136  K 3.9 4.2 4.1 4.5 4.0  CL 104 104 105 106 101  CO2 25 24 21* 24 24  GLUCOSE 89 100* 129* 123* 134*  BUN 13 13 16 17 18   CREATININE 1.15 1.01 1.01 0.97 0.94  CALCIUM 9.2 9.2 9.6 9.5 9.3   Estimated Creatinine Clearance: 110.1 mL/min (by C-G formula based on SCr of 0.94 mg/dL). Liver & Pancreas: Recent Labs  Lab 12/26/22 0832 12/27/22 1430 12/28/22 0449 12/29/22 0600 12/30/22 0525  AST 22 24 41 34 31  ALT 32 32 49* 54* 55*  ALKPHOS 71 77 91 102 93  BILITOT 0.7 0.8 0.9 0.9 0.9  PROT 7.1 7.1 8.0 7.7 7.1  ALBUMIN 4.0 3.7 4.2 3.8 3.6   No results for input(s): "LIPASE", "AMYLASE" in the last 168 hours. No results for input(s): "AMMONIA" in the last 168 hours. Diabetic: No results for input(s): "HGBA1C" in the last 72 hours. No results for input(s): "GLUCAP" in the last 168 hours. Cardiac Enzymes: No results for input(s): "CKTOTAL", "CKMB", "CKMBINDEX", "TROPONINI" in the last 168 hours. No results for input(s): "PROBNP" in the last 8760 hours. Coagulation Profile: No results for input(s): "INR", "PROTIME" in the last 168 hours.  Thyroid Function Tests: No results for input(s): "TSH", "T4TOTAL", "FREET4", "T3FREE", "THYROIDAB" in the last 72 hours. Lipid Profile: No results for input(s): "CHOL", "HDL", "LDLCALC", "TRIG", "CHOLHDL", "LDLDIRECT" in the last 72 hours.  Anemia Panel: No results for input(s): "VITAMINB12", "FOLATE", "FERRITIN", "TIBC", "IRON", "RETICCTPCT"  in the last 72 hours. Urine analysis:    Component Value Date/Time   COLORURINE STRAW (A) 12/18/2022 0552   APPEARANCEUR CLEAR 12/18/2022 0552   LABSPEC 1.025 12/18/2022 0552   PHURINE 7.0 12/18/2022 0552   GLUCOSEU NEGATIVE 12/18/2022 0552   HGBUR NEGATIVE 12/18/2022 0552   BILIRUBINUR NEGATIVE 12/18/2022 0552   KETONESUR NEGATIVE 12/18/2022 0552   PROTEINUR NEGATIVE 12/18/2022 0552   NITRITE NEGATIVE 12/18/2022 0552   LEUKOCYTESUR NEGATIVE 12/18/2022 0552   Sepsis Labs: Invalid input(s): "PROCALCITONIN", "LACTICIDVEN"  Microbiology: Recent Results (from the past 240 hour(s))  Fungus Culture With Stain     Status: None (Preliminary result)  Collection Time: 12/21/22  2:05 PM   Specimen: Bronchial Alveolar Lavage; Respiratory  Result Value Ref Range Status   Fungus Stain Final report  Final    Comment: (NOTE) Performed At: [facility] [address] [place], [place] [id number] Jolene Schimke MD AO:[id number]    Fungus (Mycology) Culture PENDING  Incomplete   Fungal Source BRONCHIAL ALVEOLAR LAVAGE  Final    Comment: LEFT UPPER LOBE Performed at [facility], [address], [place], [place] [address]   Fungus Culture With Stain     Status: None (Preliminary result)   Collection Time: 12/21/22  2:05 PM   Specimen: Bronchial Alveolar Lavage; Respiratory  Result Value Ref Range Status   Fungus Stain Final report  Final    Comment: (NOTE) Performed At: [facility] [address] [place], [place] [id number] Jolene Schimke MD NU:[id number]    Fungus (Mycology) Culture PENDING  Incomplete   Fungal Source BRONCHIAL ALVEOLAR LAVAGE  Final    Comment: LEFT LOWER LOBE Performed at [facility], [address] [place], [place] [address]   Culture, Respiratory w Gram Stain     Status: None   Collection Time: 12/21/22  2:05 PM   Specimen: Bronchial Alveolar Lavage; Respiratory  Result Value Ref Range Status   Specimen  Description   Final    BRONCHIAL ALVEOLAR LAVAGE LEFT UPPER LOBE Performed at [facility], [address], [place], [place] [address]    Special Requests   Final    BRONCHIAL ALVEOLAR LAVAGE LEFT UPPER LOBE Performed at [facility], [address], [place], [place] [address]    Gram Stain   Final    RARE WBC PRESENT, PREDOMINANTLY MONONUCLEAR NO ORGANISMS SEEN    Culture   Final    NO GROWTH 3 DAYS Performed at [facility], [address], [place], [place] [address]    Report Status 12/25/2022 FINAL  Final  Culture, Respiratory w Gram Stain     Status: None   Collection Time: 12/21/22  2:05 PM   Specimen: Bronchial Alveolar Lavage; Respiratory  Result Value Ref Range Status   Specimen Description   Final    BRONCHIAL ALVEOLAR LAVAGE LEFT LOWER LOBE Performed at [facility], [address] [place], [place] [address]    Special Requests   Final    BRONCHIAL ALVEOLAR LAVAGE LEFT LOWER LOBE Performed at [facility], [address] [place], [place] [address]    Gram Stain NO WBC SEEN NO ORGANISMS SEEN   Final   Culture   Final    NO GROWTH 3 DAYS Performed at [facility], [address] [place], [place] [address]    Report Status 12/25/2022 FINAL  Final  Acid Fast Smear (AFB)     Status: None   Collection Time: 12/21/22  2:05 PM   Specimen: Bronchial Alveolar Lavage; Respiratory  Result Value Ref Range Status   AFB Specimen Processing Concentration  Final   Acid Fast Smear Negative  Final    Comment: (NOTE) Performed At: [facility] [address] [place], [place] [id number] Jolene Schimke MD UK:[id number]    Source (AFB) BRONCHIAL ALVEOLAR LAVAGE  Final    Comment: LEFT UPPER LOBE Performed at [facility], [address] [place], [place] [address]   Acid Fast Smear (AFB)     Status: None   Collection Time: 12/21/22  2:05 PM   Specimen:  Bronchial Alveolar  Lavage; Respiratory  Result Value Ref Range Status   AFB Specimen Processing Concentration  Final   Acid Fast Smear Negative  Final    Comment: (NOTE) Performed At: [facility] [address] [place], [place] [id number] Jolene Schimke MD WU:[id number]    Source (AFB) BRONCHIAL ALVEOLAR LAVAGE  Final    Comment:  LEFT LOWER LOBE Performed at [facility], [address] [place], [place] [address]   Anaerobic culture w Gram Stain     Status: None   Collection Time: 12/21/22  2:05 PM   Specimen: Bronchoalveolar Lavage  Result Value Ref Range Status   Specimen Description BRONCHIAL ALVEOLAR LAVAGE  Final   Special Requests LEFT LOWER LOBE  Final   Gram Stain   Final    WBC PRESENT, PREDOMINANTLY MONONUCLEAR NO ORGANISMS SEEN CYTOSPIN SMEAR    Culture   Final    NO ANAEROBES ISOLATED Performed at [facility], [address], [place], [place] [address]    Report Status 12/26/2022 FINAL  Final  Anaerobic culture w Gram Stain     Status: None   Collection Time: 12/21/22  2:05 PM   Specimen: Bronchoalveolar Lavage  Result Value Ref Range Status   Specimen Description BRONCHIAL ALVEOLAR LAVAGE  Final   Special Requests LEFT UPPER LOBE  Final   Gram Stain   Final    WBC PRESENT, PREDOMINANTLY MONONUCLEAR NO ORGANISMS SEEN CYTOSPIN SMEAR    Culture   Final    NO ANAEROBES ISOLATED Performed at [facility], [address] [place], [place] [address]    Report Status 12/26/2022 FINAL  Final  Fungus Culture Result     Status: None   Collection Time: 12/21/22  2:05 PM  Result Value Ref Range Status   Result 1 Comment  Final    Comment: (NOTE) KOH/Calcofluor preparation:  no fungus observed. Performed At: [facility] [address] [place], [place] [id number] Jolene Schimke MD KG:[id number]   Fungus Culture Result     Status: None   Collection Time: 12/21/22  2:05 PM  Result Value Ref Range Status   Result  1 Comment  Final    Comment: (NOTE) KOH/Calcofluor preparation:  no fungus observed. Performed At: [facility] [address] [place], [place] [id number] Jolene Schimke MD VZ:[id number]   CSF culture w Gram Stain     Status: None   Collection Time: 12/22/22 12:34 PM   Specimen: CSF; Cerebrospinal Fluid  Result Value Ref Range Status   Specimen Description   Final    CSF Performed at [facility], [address], [place], [place] [address]    Special Requests   Final    Normal Performed at [facility], [address] [place], [place] [address]    Gram Stain NO WBC SEEN NO ORGANISMS SEEN CYTOSPIN SMEAR   Final   Culture   Final    NO GROWTH 3 DAYS Performed at [facility], [address], [place], [place] [address]    Report Status 12/25/2022 FINAL  Final    Radiology Studies: No results found.    Mattea Seger T. Zalen Sequeira [facility]  If 7PM-7AM, please contact night-coverage [email or web address] 12/30/2022, 3:46 PM

## 2022-12-30 NOTE — Plan of Care (Signed)
Patient had BM today.  Minimal SOB with ambulation.  MD plan to follow up with CXR tomorrow to see about returning effusion.

## 2022-12-31 DIAGNOSIS — F17200 Nicotine dependence, unspecified, uncomplicated: Secondary | ICD-10-CM | POA: Diagnosis not present

## 2022-12-31 DIAGNOSIS — C3492 Malignant neoplasm of unspecified part of left bronchus or lung: Secondary | ICD-10-CM | POA: Diagnosis not present

## 2022-12-31 DIAGNOSIS — J90 Pleural effusion, not elsewhere classified: Secondary | ICD-10-CM | POA: Diagnosis not present

## 2022-12-31 DIAGNOSIS — E876 Hypokalemia: Secondary | ICD-10-CM | POA: Diagnosis not present

## 2022-12-31 LAB — CBC WITH DIFFERENTIAL/PLATELET
Abs Immature Granulocytes: 0.27 10*3/uL — ABNORMAL HIGH (ref 0.00–0.07)
Basophils Absolute: 0 10*3/uL (ref 0.0–0.1)
Basophils Relative: 0 %
Eosinophils Absolute: 0 10*3/uL (ref 0.0–0.5)
Eosinophils Relative: 0 %
HCT: 42.2 % (ref 39.0–52.0)
Hemoglobin: 15 g/dL (ref 13.0–17.0)
Immature Granulocytes: 2 %
Lymphocytes Relative: 6 %
Lymphs Abs: 1 10*3/uL (ref 0.7–4.0)
MCH: 31.4 pg (ref 26.0–34.0)
MCHC: 35.5 g/dL (ref 30.0–36.0)
MCV: 88.5 fL (ref 80.0–100.0)
Monocytes Absolute: 0.1 10*3/uL (ref 0.1–1.0)
Monocytes Relative: 0 %
Neutro Abs: 16.3 10*3/uL — ABNORMAL HIGH (ref 1.7–7.7)
Neutrophils Relative %: 92 %
Platelets: 273 10*3/uL (ref 150–400)
RBC: 4.77 MIL/uL (ref 4.22–5.81)
RDW: 13.2 % (ref 11.5–15.5)
WBC: 17.6 10*3/uL — ABNORMAL HIGH (ref 4.0–10.5)
nRBC: 0 % (ref 0.0–0.2)

## 2022-12-31 NOTE — Progress Notes (Signed)
PROGRESS NOTE  Christopher Henson WUJ:[id number] DOB: 1980/06/17   PCP: [name], PA-C  Patient is from: Home.  DOA: 12/18/2022 LOS: 13  Chief complaints Chief Complaint  Patient presents with   Chest Pain     Brief Narrative / Interim history: 42 year old M with PMH of HIV/AIDS,  cryptococcal meningitis, alcohol use and tobacco use presenting with chest pain, shortness of breath, generalized weakness and unintentional weight loss, and admitted for anterior mediastinal mass.  Patient stated he has not been able to take his antiviral medications for about a week due to cost.  He was seen in ED on 9/3 and had chest x-ray that was concerning for mediastinal mass.  He was recommended to have CT but he left AMA.    In ED, CT angio chest negative for PE but 8 x 7.5 cm anterior mediastinal mass, 1.6 x 1.2 cm LUL nodule, interval increase in peribronchovascular groundglass airspace opacities within LUL and interval increase in size of small left pleural effusion.  CT abdomen and pelvis without significant finding.  Pulmonology and infectious disease consulted.  He underwent left thoracocentesis with removal of 800 cc exudative fluid.   Pleural fluid culture NGTD.  Cytology with malignant cells.  Underwent bronchoscopy on 9/12.  Biopsy consistent with malignancy.  Underwent bone marrow biopsy on 9/18.  Had a port a cath placed on 9/18 and started chemotherapy as well.  Patient also had elevated serum cryptococcal antigen for which she was started on Diflucan.  Underwent lumbar puncture on 9/13 with opening pressure of 21 cm water.  Fluid study negative for cryptococcal antigen.  Continued on Diflucan per ID.   Patient developed shortness of breath with increased oxygen requirement and left-sided chest pain.  Chest x-ray showed left-sided pleural effusion again.  Underwent thoracocentesis with removal of 1 L fluid on 9/20.  Slowly improving.  Likely discharge on 9/23  Subjective: Seen and examined  earlier this morning.  No major events overnight of this morning.  He says he felt better after he had a bowel movement yesterday.  Sitting in bed eating breakfast.  Took his oxygen off while eating breakfast.  Objective: Vitals:   12/30/22 1643 12/30/22 2056 12/31/22 0334 12/31/22 1349  BP: (!) 149/103 (!) 159/99 (!) 145/103 (!) 129/96  Pulse:  90 90 99  Resp:  18 18 15   Temp:  98 F (36.7 C) 98.2 F (36.8 C) 98.1 F (36.7 C)  TempSrc:  Oral Oral Oral  SpO2:  95% 92% (!) 89%  Weight:      Height:        Examination:  GENERAL: No apparent distress.  Nontoxic. HEENT: MMM.  Vision and hearing grossly intact.  NECK: Supple.  No apparent JVD.  RESP:  No IWOB.  Diminished aeration at left lung base. CVS:  RRR. Heart sounds normal.  ABD/GI/GU: BS+. Abd soft, NTND.  MSK/EXT:   No apparent deformity. Moves extremities. No edema.  SKIN: no apparent skin lesion or wound NEURO: Awake and alert. Oriented appropriately.  No apparent focal neuro deficit. PSYCH: Calm. Normal affect.    Procedures:  9/9-thoracocentesis with removal of 800 cc exudative fluid 9/12-bronchoscopy 9/13-lumbar puncture 9/18-port a cath placement 9/20-left-sided thoracocentesis with removal of 1000 cc fluid  Microbiology summarized: 9/9-COVID-19, influenza and RSV PCR nonreactive 9/9-blood culture with staph epidermis in 1 out of 4 bottles 9/9-pleural fluid cultures NGTD  Assessment and plan: Principal Problem:   Small cell lung cancer, left (HCC) Active Problems:   HIV (human  immunodeficiency virus infection) (HCC)   Pleural effusion on left   Hypokalemia   Alcoholism (HCC)   Smoker   Cryptococcal meningitis (HCC)   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  Pleural fluid with malignant cells.  Underwent bronchoscopy.  Pathology showed lung cancer.  MRI brain without acute finding.  Pathology from bone biopsy negative.  Port a  cath placed on 9/18, started chemo. -Continue chemotherapy per oncology -Continue fentanyl patch and oxycodone for pain -Monitor labs.  Acute respiratory failure with hypoxia: Due to the above.  Had acute dyspnea with increased oxygen requirement on 9/19.  CXR with recurrent left pleural effusion.  Improved. -S/p repeat thoracocentesis with removal of 1 L on 9/20. -Encouraged incentive parameter. -Pain control as above. -Continue weaning oxygen -Consider repeat chest x-ray if worse or no improvement.   HIV: CD4 count 258.  Noncompliant with meds. -Appreciate ID recs-continue Biktarvy, Diflucan and Bactrim   Serum positive cryptococcal antigen: Underwent fluoroscopy guided LP with opening pressure of 21 cm water.  Fluid study negative for cryptococcal antigen. -ID recommends continuing Diflucan until outpatient follow-up on 10/16.   Anxiety: Stable -Continue Ativan as needed   Tobacco abuse: -Continue nicotine patch   Alcohol use: No signs of withdrawal -continue thiamine and folate.   Noncompliance: -Has been counseled.  Mild elevated LFT: Improved.   Hypokalemia: -Monitor replenish as appropriate   Blood cultures positive: for Staph epidermidis 1 out of 4 likely contaminant.  Leukocytosis: Likely demargination from steroid.  Improved.  Constipation: Resolved. -Bowel regimen-scheduled Senokot-S and lactulose  Hiccups -IV Thorazine as needed  Body mass index is 23.59 kg/m.           DVT prophylaxis:  enoxaparin (LOVENOX) injection 40 mg Start: 12/28/22 1000  Code Status: Full code Family Communication: None at bedside Level of care: Med-Surg Status is: Inpatient Remains inpatient appropriate because: Lung cancer, respiratory failure and recurrent pleural effusion   Final disposition: Home Consultants:  Pulmonology Infectious disease Interventional radiology Oncology  35 minutes with more than 50% spent in reviewing records, counseling  patient/family and coordinating care.   Sch Meds:  Scheduled Meds:  bictegravir-emtricitabine-tenofovir AF  1 tablet Oral Daily   Chlorhexidine Gluconate Cloth  6 each Topical Daily   dronabinol  5 mg Oral BID AC   enoxaparin (LOVENOX) injection  40 mg Subcutaneous Q24H   feeding supplement  237 mL Oral BID BM   fentaNYL  1 patch Transdermal Q72H   fluconazole  200 mg Oral Daily   lactulose  20 g Oral BID   senna-docusate  1 tablet Oral BID   sulfamethoxazole-trimethoprim  1 tablet Oral Daily   Continuous Infusions:  sodium chloride     chlorproMAZINE (THORAZINE) 12.5 mg in sodium chloride 0.9 % 25 mL IVPB     PRN Meds:.alum & mag hydroxide-simeth, bisacodyl, chlorproMAZINE (THORAZINE) 12.5 mg in sodium chloride 0.9 % 25 mL IVPB, ipratropium-albuterol, LORazepam, ondansetron **OR** ondansetron (ZOFRAN) IV, mouth rinse, oxyCODONE  Antimicrobials: Anti-infectives (From admission, onward)    Start     Dose/Rate Route Frequency Ordered Stop   12/25/22 1145  sulfamethoxazole-trimethoprim (BACTRIM DS) 800-160 MG per tablet 1 tablet        1 tablet Oral Daily 12/25/22 1057     12/24/22 1000  Ampicillin-Sulbactam (UNASYN) 3 g in sodium chloride 0.9 % 100 mL IVPB        3 g 200 mL/hr over 30 Minutes Intravenous Every 6 hours 12/24/22 0929  12/25/22 1900   12/22/22 2030  fluconazole (DIFLUCAN) tablet 200 mg        200 mg Oral Daily 12/22/22 1915     12/18/22 2300  bictegravir-emtricitabine-tenofovir AF (BIKTARVY) 50-200-25 MG per tablet 1 tablet        1 tablet Oral Daily 12/18/22 1910     12/18/22 1100  Ampicillin-Sulbactam (UNASYN) 3 g in sodium chloride 0.9 % 100 mL IVPB  Status:  Discontinued        3 g 200 mL/hr over 30 Minutes Intravenous Every 6 hours 12/18/22 1054 12/24/22 0929   12/18/22 0500  sulfamethoxazole-trimethoprim (BACTRIM) 415 mg of trimethoprim in dextrose 5 % 500 mL IVPB  Status:  Discontinued        415 mg of trimethoprim 350.6 mL/hr over 90 Minutes Intravenous  Every 6 hours 12/18/22 0427 12/18/22 1051        I have personally reviewed the following labs and images: CBC: Recent Labs  Lab 12/27/22 1430 12/28/22 0449 12/29/22 0600 12/30/22 0525 12/31/22 0500  WBC 11.1* 17.5* 29.5* 22.0* 17.6*  NEUTROABS 8.3* 15.9* 27.3* 21.0* 16.3*  HGB 15.6 16.4 15.9 15.8 15.0  HCT 44.6 46.6 45.4 44.7 42.2  MCV 89.9 90.5 90.4 88.9 88.5  PLT 246 273 270 267 273   BMP &GFR Recent Labs  Lab 12/26/22 0832 12/27/22 1430 12/28/22 0449 12/29/22 0600 12/30/22 0525  NA 138 139 138 140 136  K 3.9 4.2 4.1 4.5 4.0  CL 104 104 105 106 101  CO2 25 24 21* 24 24  GLUCOSE 89 100* 129* 123* 134*  BUN 13 13 16 17 18   CREATININE 1.15 1.01 1.01 0.97 0.94  CALCIUM 9.2 9.2 9.6 9.5 9.3   Estimated Creatinine Clearance: 110.1 mL/min (by C-G formula based on SCr of 0.94 mg/dL). Liver & Pancreas: Recent Labs  Lab 12/26/22 0832 12/27/22 1430 12/28/22 0449 12/29/22 0600 12/30/22 0525  AST 22 24 41 34 31  ALT 32 32 49* 54* 55*  ALKPHOS 71 77 91 102 93  BILITOT 0.7 0.8 0.9 0.9 0.9  PROT 7.1 7.1 8.0 7.7 7.1  ALBUMIN 4.0 3.7 4.2 3.8 3.6   No results for input(s): "LIPASE", "AMYLASE" in the last 168 hours. No results for input(s): "AMMONIA" in the last 168 hours. Diabetic: No results for input(s): "HGBA1C" in the last 72 hours. No results for input(s): "GLUCAP" in the last 168 hours. Cardiac Enzymes: No results for input(s): "CKTOTAL", "CKMB", "CKMBINDEX", "TROPONINI" in the last 168 hours. No results for input(s): "PROBNP" in the last 8760 hours. Coagulation Profile: No results for input(s): "INR", "PROTIME" in the last 168 hours.  Thyroid Function Tests: No results for input(s): "TSH", "T4TOTAL", "FREET4", "T3FREE", "THYROIDAB" in the last 72 hours. Lipid Profile: No results for input(s): "CHOL", "HDL", "LDLCALC", "TRIG", "CHOLHDL", "LDLDIRECT" in the last 72 hours.  Anemia Panel: No results for input(s): "VITAMINB12", "FOLATE", "FERRITIN", "TIBC",  "IRON", "RETICCTPCT" in the last 72 hours. Urine analysis:    Component Value Date/Time   COLORURINE STRAW (A) 12/18/2022 0552   APPEARANCEUR CLEAR 12/18/2022 0552   LABSPEC 1.025 12/18/2022 0552   PHURINE 7.0 12/18/2022 0552   GLUCOSEU NEGATIVE 12/18/2022 0552   HGBUR NEGATIVE 12/18/2022 0552   BILIRUBINUR NEGATIVE 12/18/2022 0552   KETONESUR NEGATIVE 12/18/2022 0552   PROTEINUR NEGATIVE 12/18/2022 0552   NITRITE NEGATIVE 12/18/2022 0552   LEUKOCYTESUR NEGATIVE 12/18/2022 0552   Sepsis Labs: Invalid input(s): "PROCALCITONIN", "LACTICIDVEN"  Microbiology: Recent Results (from the past 240 hour(s))  CSF culture w  Gram Stain     Status: None   Collection Time: 12/22/22 12:34 PM   Specimen: CSF; Cerebrospinal Fluid  Result Value Ref Range Status   Specimen Description   Final    CSF Performed at [facility], [address] [place], [place] [address]    Special Requests   Final    Normal Performed at [facility], [address], [place], [place] [address]    Gram Stain NO WBC SEEN NO ORGANISMS SEEN CYTOSPIN SMEAR   Final   Culture   Final    NO GROWTH 3 DAYS Performed at [facility], [address], [place], [place] [address]    Report Status 12/25/2022 FINAL  Final    Radiology Studies: No results found.    Haizlee Henton T. Treyshon Buchanon [facility]  If 7PM-7AM, please contact night-coverage [email or web address] 12/31/2022, 3:53 PM

## 2022-12-31 NOTE — Progress Notes (Signed)
Christopher Henson states he only needs O2 when is active, like ambulating. He gets short of breath on exertion. He doesn't need it when he is lying in bed.

## 2022-12-31 NOTE — Plan of Care (Signed)
  Problem: Activity: Goal: Ability to tolerate increased activity will improve Outcome: Progressing   Problem: Clinical Measurements: Goal: Ability to maintain a body temperature in the normal range will improve Outcome: Progressing   Problem: Respiratory: Goal: Ability to maintain adequate ventilation will improve Outcome: Progressing Goal: Ability to maintain a clear airway will improve Outcome: Progressing   Problem: Education: Goal: Knowledge of General Education information will improve Description: Including pain rating scale, medication(s)/side effects and non-pharmacologic comfort measures Outcome: Progressing   Problem: Health Behavior/Discharge Planning: Goal: Ability to manage health-related needs will improve Outcome: Progressing   Problem: Clinical Measurements: Goal: Ability to maintain clinical measurements within normal limits will improve Outcome: Progressing Goal: Will remain free from infection Outcome: Progressing Goal: Diagnostic test results will improve Outcome: Progressing Goal: Cardiovascular complication will be avoided Outcome: Progressing   Problem: Activity: Goal: Risk for activity intolerance will decrease Outcome: Progressing   Problem: Nutrition: Goal: Adequate nutrition will be maintained Outcome: Progressing   Problem: Coping: Goal: Level of anxiety will decrease Outcome: Progressing   Problem: Elimination: Goal: Will not experience complications related to bowel motility Outcome: Progressing Goal: Will not experience complications related to urinary retention Outcome: Progressing   Problem: Pain Managment: Goal: General experience of comfort will improve Outcome: Progressing   Problem: Safety: Goal: Ability to remain free from injury will improve Outcome: Progressing   Problem: Skin Integrity: Goal: Risk for impaired skin integrity will decrease Outcome: Progressing

## 2022-12-31 NOTE — Plan of Care (Signed)

## 2023-01-01 ENCOUNTER — Inpatient Hospital Stay (HOSPITAL_COMMUNITY): Payer: 59

## 2023-01-01 ENCOUNTER — Other Ambulatory Visit (HOSPITAL_COMMUNITY): Payer: Self-pay

## 2023-01-01 ENCOUNTER — Encounter (HOSPITAL_COMMUNITY): Payer: Self-pay | Admitting: Hematology & Oncology

## 2023-01-01 ENCOUNTER — Telehealth (HOSPITAL_COMMUNITY): Payer: Self-pay | Admitting: Pharmacy Technician

## 2023-01-01 DIAGNOSIS — E876 Hypokalemia: Secondary | ICD-10-CM | POA: Diagnosis not present

## 2023-01-01 DIAGNOSIS — J90 Pleural effusion, not elsewhere classified: Secondary | ICD-10-CM | POA: Diagnosis not present

## 2023-01-01 DIAGNOSIS — F17200 Nicotine dependence, unspecified, uncomplicated: Secondary | ICD-10-CM | POA: Diagnosis not present

## 2023-01-01 DIAGNOSIS — C3492 Malignant neoplasm of unspecified part of left bronchus or lung: Secondary | ICD-10-CM | POA: Diagnosis not present

## 2023-01-01 LAB — CBC
HCT: 45 % (ref 39.0–52.0)
Hemoglobin: 16 g/dL (ref 13.0–17.0)
MCH: 31.3 pg (ref 26.0–34.0)
MCHC: 35.6 g/dL (ref 30.0–36.0)
MCV: 87.9 fL (ref 80.0–100.0)
Platelets: 286 10*3/uL (ref 150–400)
RBC: 5.12 MIL/uL (ref 4.22–5.81)
RDW: 13.2 % (ref 11.5–15.5)
WBC: 12.1 10*3/uL — ABNORMAL HIGH (ref 4.0–10.5)
nRBC: 0 % (ref 0.0–0.2)

## 2023-01-01 LAB — COMPREHENSIVE METABOLIC PANEL WITH GFR
ALT: 67 U/L — ABNORMAL HIGH (ref 0–44)
AST: 32 U/L (ref 15–41)
Albumin: 3.4 g/dL — ABNORMAL LOW (ref 3.5–5.0)
Alkaline Phosphatase: 95 U/L (ref 38–126)
Anion gap: 10 (ref 5–15)
BUN: 22 mg/dL — ABNORMAL HIGH (ref 6–20)
CO2: 26 mmol/L (ref 22–32)
Calcium: 9 mg/dL (ref 8.9–10.3)
Chloride: 99 mmol/L (ref 98–111)
Creatinine, Ser: 0.96 mg/dL (ref 0.61–1.24)
GFR, Estimated: >60 mL/min
Glucose, Bld: 95 mg/dL (ref 70–99)
Potassium: 4.2 mmol/L (ref 3.5–5.1)
Sodium: 135 mmol/L (ref 135–145)
Total Bilirubin: 1.3 mg/dL — ABNORMAL HIGH (ref 0.3–1.2)
Total Protein: 7.2 g/dL (ref 6.5–8.1)

## 2023-01-01 LAB — MAGNESIUM: Magnesium: 2.2 mg/dL (ref 1.7–2.4)

## 2023-01-01 MED ORDER — FUROSEMIDE 10 MG/ML IJ SOLN
20.0000 mg | Freq: Once | INTRAMUSCULAR | Status: AC
  Administered 2023-01-01: 20 mg via INTRAVENOUS
  Filled 2023-01-01: qty 2

## 2023-01-01 MED ORDER — BICTEGRAVIR-EMTRICITAB-TENOFOV 50-200-25 MG PO TABS
1.0000 | ORAL_TABLET | Freq: Every day | ORAL | 0 refills | Status: DC
  Filled 2023-01-01: qty 30, 30d supply, fill #0

## 2023-01-01 MED ORDER — SULFAMETHOXAZOLE-TRIMETHOPRIM 800-160 MG PO TABS
1.0000 | ORAL_TABLET | Freq: Every day | ORAL | 0 refills | Status: AC
  Filled 2023-01-01: qty 30, 30d supply, fill #0

## 2023-01-01 MED ORDER — LACTULOSE 10 GM/15ML PO SOLN: 20.0000 g | Freq: Two times a day (BID) | ORAL | Status: DC | PRN

## 2023-01-01 MED ORDER — FLUCONAZOLE 200 MG PO TABS
200.0000 mg | ORAL_TABLET | Freq: Every day | ORAL | 0 refills | Status: AC
  Filled 2023-01-01: qty 30, 30d supply, fill #0

## 2023-01-01 NOTE — Plan of Care (Signed)
  Problem: Activity: Goal: Ability to tolerate increased activity will improve Outcome: Progressing   Problem: Clinical Measurements: Goal: Ability to maintain a body temperature in the normal range will improve Outcome: Progressing   Problem: Respiratory: Goal: Ability to maintain adequate ventilation will improve Outcome: Progressing Goal: Ability to maintain a clear airway will improve Outcome: Progressing   Problem: Education: Goal: Knowledge of General Education information will improve Description: Including pain rating scale, medication(s)/side effects and non-pharmacologic comfort measures Outcome: Progressing   Problem: Health Behavior/Discharge Planning: Goal: Ability to manage health-related needs will improve Outcome: Progressing   Problem: Clinical Measurements: Goal: Ability to maintain clinical measurements within normal limits will improve Outcome: Progressing Goal: Diagnostic test results will improve Outcome: Progressing Goal: Respiratory complications will improve Outcome: Progressing Goal: Cardiovascular complication will be avoided Outcome: Progressing   Problem: Activity: Goal: Risk for activity intolerance will decrease Outcome: Progressing   Problem: Nutrition: Goal: Adequate nutrition will be maintained Outcome: Progressing   Problem: Coping: Goal: Level of anxiety will decrease Outcome: Progressing   Problem: Pain Managment: Goal: General experience of comfort will improve Outcome: Progressing   Problem: Safety: Goal: Ability to remain free from injury will improve Outcome: Progressing

## 2023-01-01 NOTE — Telephone Encounter (Signed)
Patient Advocate Encounter  Completed and sent [employer] Pay Card application for [place] for this patient     BIN      [id number] PCN    ACCESS GRP    [id number] ID        [id number]     Roland Earl, CPhT Pharmacy Patient Advocate Specialist [facility] Patient Advocate Team Direct Number: [phone] Fax: [phone]

## 2023-01-01 NOTE — Progress Notes (Signed)
Now, Christopher Henson complains of some dizziness.  Not sure exactly why he would have this.  The only thing I can really think would be the Duragesic patch that we put on him.  Maybe, we can take this off.  We will see how his dizziness improves, if any.  He still having some discomfort over on the left side.  I told him that this will be present for quite a while.  There is no complaint of shortness of breath.  He does have some nausea.  I will try him on some [place] again.  His labs show a white cell count 12.1.  Hemoglobin 16 hematocrit 45.  Platelet count 268,000.  His sodium 135.  Potassium 4.2.  BUN 22 creatinine 0.96.  Calcium 9 with an albumin of 3.4.  His bilirubin is 1.3 with an ALT of 67.  He is having bowel movements.  We gave him some lactulose.  I think this helped.  He is ambulating.  He thinks that if you can go outside, he will feel a lot better.  He probably is right about this.  I am not sure how we can get him outside.   Vital signs are temperature of 98.5.  Pulse 92.  Blood pressure 122/95.  His head and neck exam shows no ocular or oral lesions.  He has no thrush.  There is no adenopathy in the neck.  Lungs are clear on the right side.  He has decent breath sounds on the left side.  Cardiac exam regular rate and rhythm.  Has no murmurs.  There is no ectopic beat.  Abdominal exam is soft.  Bowel sounds are present.  There is no fluid wave.  Extremity shows no clubbing, cyanosis or edema.  He has good strength bilaterally.  Neurological exam shows no focal neurological deficits.  I think would be nice to try to get [name] home.  He has social issues at home.  He really needs to have the Case Manager see him to try to help him with these issues.  He may need some portable Oxygen at home.  Again, I am unsure how we can arrange for this.  I will stop the Duragesic patch.  We will see if this may help with his dizziness.   Christin Bach, MD  [place] 4:13

## 2023-01-01 NOTE — TOC Benefit Eligibility Note (Addendum)
Patient Product/process development scientist completed.    The patient is insured through [employer]. Patient has [employer], may use a copay card, and/or apply for patient assistance if available.    Ran test claim for Biktarvy and the current 30 day co-pay is [phone] due to a $3500 deductible  Ran test claim for fluconazole 200 mg  and the current 30 day co-pay is $13.48.  Ran test claim for sulfamethoxazole-trimethoprim 800-160 mg and the current 30 day co-pay is $1.91.   This test claim was processed through [facility]- copay amounts may vary at other pharmacies due to pharmacy/plan contracts, or as the patient moves through the different stages of their insurance plan.     Roland Earl, CPHT Pharmacy Technician III Certified Patient Advocate [facility] Patient Advocate Team Direct Number: [phone]  Fax: [phone]

## 2023-01-01 NOTE — Progress Notes (Signed)
TOC meds

## 2023-01-01 NOTE — Plan of Care (Signed)

## 2023-01-01 NOTE — Progress Notes (Signed)
PROGRESS NOTE  Christopher Henson RUE:[id number] DOB: [birth year]   PCP: [name], PA-C  Patient is from: [facility].  DOA: 12/18/2022 LOS: 14  Chief complaints Chief Complaint  Patient presents with   Chest Pain     Brief Narrative / Interim history: 42 year old M with PMH of HIV/AIDS,  cryptococcal meningitis, alcohol use and tobacco use presenting with chest pain, shortness of breath, generalized weakness and unintentional weight loss, and admitted for anterior mediastinal mass.  Patient stated he has not been able to take his antiviral medications for about a week due to cost.  He was seen in ED on 9/3 and had chest x-ray that was concerning for mediastinal mass.  He was recommended to have CT but he left AMA.    In ED, CT angio chest negative for PE but 8 x 7.5 cm anterior mediastinal mass, 1.6 x 1.2 cm LUL nodule, interval increase in peribronchovascular groundglass airspace opacities within LUL and interval increase in size of small left pleural effusion.  CT abdomen and pelvis without significant finding.  Pulmonology and infectious disease consulted.  He underwent left thoracocentesis with removal of 800 cc exudative fluid.   Pleural fluid culture NGTD.  Cytology with malignant cells.  Underwent bronchoscopy on 9/12.  Biopsy consistent with malignancy.  Underwent bone marrow biopsy on 9/18.  Had a port a cath placed on 9/18 and started chemotherapy as well.  Patient also had elevated serum cryptococcal antigen for which she was started on Diflucan.  Underwent lumbar puncture on 9/13 with opening pressure of 21 cm water.  Fluid study negative for cryptococcal antigen.  Continued on Diflucan per ID.   Patient developed shortness of breath with increased oxygen requirement and left-sided chest pain.  Chest x-ray showed left-sided pleural effusion again.  Underwent thoracocentesis with removal of 1 L fluid on 9/20.  Slowly improving.  Likely discharge on 9/24.  Subjective: Seen and  examined earlier this morning.  Reports left-sided chest pain.  He rates the pain 7/10.  Also reports hiccups.  He reported dizziness to oncology this morning.  Fentanyl patch was discontinued.  He says his dizziness and DOE after exertion, and improves with rest.  Objective: Vitals:   12/30/22 2056 12/31/22 0334 12/31/22 1349 01/01/23 0547  BP: (!) 159/99 (!) 145/103 (!) 129/96 (!) 122/95  Pulse: 90 90 99 92  Resp: 18 18 15 18   Temp: 98 F (36.7 C) 98.2 F (36.8 C) 98.1 F (36.7 C) 98.5 F (36.9 C)  TempSrc: Oral Oral Oral Oral  SpO2: 95% 92% (!) 89% 99%  Weight:      Height:        Examination:  GENERAL: No apparent distress.  Nontoxic. HEENT: MMM.  Vision and hearing grossly intact.  NECK: Supple.  No apparent JVD.  RESP:  No IWOB.  Diminished aeration at left lung base. CVS:  RRR. Heart sounds normal.  ABD/GI/GU: BS+. Abd soft, NTND.  MSK/EXT:   No apparent deformity. Moves extremities. No edema.  SKIN: no apparent skin lesion or wound NEURO: Awake and alert. Oriented appropriately.  No apparent focal neuro deficit. PSYCH: Calm. Normal affect.    Procedures:  9/9-thoracocentesis with removal of 800 cc exudative fluid 9/12-bronchoscopy 9/13-lumbar puncture 9/18-port a cath placement 9/20-left-sided thoracocentesis with removal of 1000 cc fluid  Microbiology summarized: 9/9-COVID-19, influenza and RSV PCR nonreactive 9/9-blood culture with staph epidermis in 1 out of 4 bottles 9/9-pleural fluid cultures NGTD  Assessment and plan: Principal Problem:   Small cell lung  cancer, left (HCC) Active Problems:   HIV (human immunodeficiency virus infection) (HCC)   Pleural effusion on left   Hypokalemia   Alcoholism (HCC)   Smoker   Cryptococcal meningitis (HCC)   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  Pleural fluid with malignant cells.  Underwent bronchoscopy.  Pathology showed lung cancer.   MRI brain without acute finding.  Pathology from bone biopsy negative.  Port a cath placed on 9/18, started chemo. -Completed first cycle of chemotherapy in house. -Continue as needed oxycodone.  Fentanyl patch discontinued. -Monitor labs.  Acute respiratory failure with hypoxia: Due to the above.  Had acute dyspnea with increased oxygen requirement on 9/19.  CXR with recurrent left pleural effusion.  Improved but continues to endorse significant left-sided chest pain.  Rates the pain 7/10 although he does not appear to be in that much distress.   -S/p repeat thoracocentesis with removal of 1 L on 9/20. -Encouraged incentive parameter. -Pain control as above. -Continue weaning oxygen -Repeat two-view chest x-ray today   HIV: CD4 count 258.  Noncompliant with meds. -Appreciate ID recs-continue Biktarvy, Diflucan and Bactrim   Serum positive cryptococcal antigen: Underwent fluoroscopy guided LP with opening pressure of 21 cm water.  Fluid study negative for cryptococcal antigen. -ID recommends continuing Diflucan until outpatient follow-up on 10/16.   Anxiety: Stable -Continue Ativan as needed   Tobacco abuse: -Continue nicotine patch   Alcohol use: No signs of withdrawal -continue thiamine and folate.   Noncompliance: -Has been counseled.  Mild elevated LFT: Improved.   Hypokalemia: -Monitor replenish as appropriate   Blood cultures positive: for Staph epidermidis 1 out of 4 likely contaminant.  Leukocytosis: Likely demargination from steroid.  Improved.  Constipation: Resolved. -Bowel regimen-scheduled Senokot-S and lactulose  Hiccups: Has not been using Thorazine -IV Thorazine as needed.  Encouraged him to request  Body mass index is 23.59 kg/m.           DVT prophylaxis:  enoxaparin (LOVENOX) injection 40 mg Start: 12/28/22 1000  Code Status: Full code Family Communication: None at bedside Level of care: Med-Surg Status is: Inpatient Remains inpatient  appropriate because: Lung cancer, respiratory failure and recurrent pleural effusion   Final disposition: Home on 9/24.  Planning to stay with his sister after discharge. Consultants:  Pulmonology Infectious disease Interventional radiology Oncology  35 minutes with more than 50% spent in reviewing records, counseling patient/family and coordinating care.   Sch Meds:  Scheduled Meds:  bictegravir-emtricitabine-tenofovir AF  1 tablet Oral Daily   Chlorhexidine Gluconate Cloth  6 each Topical Daily   dronabinol  5 mg Oral BID AC   enoxaparin (LOVENOX) injection  40 mg Subcutaneous Q24H   feeding supplement  237 mL Oral BID BM   fluconazole  200 mg Oral Daily   senna-docusate  1 tablet Oral BID   sulfamethoxazole-trimethoprim  1 tablet Oral Daily   Continuous Infusions:  sodium chloride     chlorproMAZINE (THORAZINE) 12.5 mg in sodium chloride 0.9 % 25 mL IVPB     PRN Meds:.alum & mag hydroxide-simeth, bisacodyl, chlorproMAZINE (THORAZINE) 12.5 mg in sodium chloride 0.9 % 25 mL IVPB, ipratropium-albuterol, lactulose, LORazepam, ondansetron **OR** ondansetron (ZOFRAN) IV, mouth rinse, oxyCODONE  Antimicrobials: Anti-infectives (From admission, onward)    Start     Dose/Rate Route Frequency Ordered Stop   12/25/22 1145  sulfamethoxazole-trimethoprim (BACTRIM DS) 800-160 MG per tablet 1 tablet        1 tablet Oral Daily  12/25/22 1057     12/24/22 1000  Ampicillin-Sulbactam (UNASYN) 3 g in sodium chloride 0.9 % 100 mL IVPB        3 g 200 mL/hr over 30 Minutes Intravenous Every 6 hours 12/24/22 0929 12/25/22 1900   12/22/22 2030  fluconazole (DIFLUCAN) tablet 200 mg        200 mg Oral Daily 12/22/22 1915     12/18/22 2300  bictegravir-emtricitabine-tenofovir AF (BIKTARVY) 50-200-25 MG per tablet 1 tablet        1 tablet Oral Daily 12/18/22 1910     12/18/22 1100  Ampicillin-Sulbactam (UNASYN) 3 g in sodium chloride 0.9 % 100 mL IVPB  Status:  Discontinued        3 g 200 mL/hr  over 30 Minutes Intravenous Every 6 hours 12/18/22 1054 12/24/22 0929   12/18/22 0500  sulfamethoxazole-trimethoprim (BACTRIM) 415 mg of trimethoprim in dextrose 5 % 500 mL IVPB  Status:  Discontinued        415 mg of trimethoprim 350.6 mL/hr over 90 Minutes Intravenous Every 6 hours 12/18/22 0427 12/18/22 1051        I have personally reviewed the following labs and images: CBC: Recent Labs  Lab 12/27/22 1430 12/28/22 0449 12/29/22 0600 12/30/22 0525 12/31/22 0500 01/01/23 0625  WBC 11.1* 17.5* 29.5* 22.0* 17.6* 12.1*  NEUTROABS 8.3* 15.9* 27.3* 21.0* 16.3*  --   HGB 15.6 16.4 15.9 15.8 15.0 16.0  HCT 44.6 46.6 45.4 44.7 42.2 45.0  MCV 89.9 90.5 90.4 88.9 88.5 87.9  PLT 246 273 270 267 273 286   BMP &GFR Recent Labs  Lab 12/27/22 1430 12/28/22 0449 12/29/22 0600 12/30/22 0525 01/01/23 0625  NA 139 138 140 136 135  K 4.2 4.1 4.5 4.0 4.2  CL 104 105 106 101 99  CO2 24 21* 24 24 26   GLUCOSE 100* 129* 123* 134* 95  BUN 13 16 17 18  22*  CREATININE 1.01 1.01 0.97 0.94 0.96  CALCIUM 9.2 9.6 9.5 9.3 9.0  MG  --   --   --   --  2.2   Estimated Creatinine Clearance: 107.8 mL/min (by C-G formula based on SCr of 0.96 mg/dL). Liver & Pancreas: Recent Labs  Lab 12/27/22 1430 12/28/22 0449 12/29/22 0600 12/30/22 0525 01/01/23 0625  AST 24 41 34 31 32  ALT 32 49* 54* 55* 67*  ALKPHOS 77 91 102 93 95  BILITOT 0.8 0.9 0.9 0.9 1.3*  PROT 7.1 8.0 7.7 7.1 7.2  ALBUMIN 3.7 4.2 3.8 3.6 3.4*   No results for input(s): "LIPASE", "AMYLASE" in the last 168 hours. No results for input(s): "AMMONIA" in the last 168 hours. Diabetic: No results for input(s): "HGBA1C" in the last 72 hours. No results for input(s): "GLUCAP" in the last 168 hours. Cardiac Enzymes: No results for input(s): "CKTOTAL", "CKMB", "CKMBINDEX", "TROPONINI" in the last 168 hours. No results for input(s): "PROBNP" in the last 8760 hours. Coagulation Profile: No results for input(s): "INR", "PROTIME" in the  last 168 hours.  Thyroid Function Tests: No results for input(s): "TSH", "T4TOTAL", "FREET4", "T3FREE", "THYROIDAB" in the last 72 hours. Lipid Profile: No results for input(s): "CHOL", "HDL", "LDLCALC", "TRIG", "CHOLHDL", "LDLDIRECT" in the last 72 hours.  Anemia Panel: No results for input(s): "VITAMINB12", "FOLATE", "FERRITIN", "TIBC", "IRON", "RETICCTPCT" in the last 72 hours. Urine analysis:    Component Value Date/Time   COLORURINE STRAW (A) 12/18/2022 0552   APPEARANCEUR CLEAR 12/18/2022 0552   LABSPEC 1.025 12/18/2022 0552   PHURINE  7.0 12/18/2022 0552   GLUCOSEU NEGATIVE 12/18/2022 0552   HGBUR NEGATIVE 12/18/2022 0552   BILIRUBINUR NEGATIVE 12/18/2022 0552   KETONESUR NEGATIVE 12/18/2022 0552   PROTEINUR NEGATIVE 12/18/2022 0552   NITRITE NEGATIVE 12/18/2022 0552   LEUKOCYTESUR NEGATIVE 12/18/2022 0552   Sepsis Labs: Invalid input(s): "PROCALCITONIN", "LACTICIDVEN"  Microbiology: Recent Results (from the past 240 hour(s))  CSF culture w Gram Stain     Status: None   Collection Time: 12/22/22 12:34 PM   Specimen: CSF; Cerebrospinal Fluid  Result Value Ref Range Status   Specimen Description   Final    CSF Performed at [facility], [address] [place], [place] [address]    Special Requests   Final    Normal Performed at [facility], [address], [place], [place] [address]    Gram Stain NO WBC SEEN NO ORGANISMS SEEN CYTOSPIN SMEAR   Final   Culture   Final    NO GROWTH 3 DAYS Performed at [facility], [address] [place], [place] [address]    Report Status 12/25/2022 FINAL  Final    Radiology Studies: No results found.    Shandricka Monroy T. Shaylyn Bawa [facility]  If 7PM-7AM, please contact night-coverage [email or web address] 01/01/2023, 12:32 PM

## 2023-01-02 ENCOUNTER — Inpatient Hospital Stay (HOSPITAL_COMMUNITY): Payer: 59

## 2023-01-02 DIAGNOSIS — C3492 Malignant neoplasm of unspecified part of left bronchus or lung: Secondary | ICD-10-CM | POA: Diagnosis not present

## 2023-01-02 DIAGNOSIS — F17200 Nicotine dependence, unspecified, uncomplicated: Secondary | ICD-10-CM | POA: Diagnosis not present

## 2023-01-02 DIAGNOSIS — J90 Pleural effusion, not elsewhere classified: Secondary | ICD-10-CM | POA: Diagnosis not present

## 2023-01-02 DIAGNOSIS — E876 Hypokalemia: Secondary | ICD-10-CM | POA: Diagnosis not present

## 2023-01-02 LAB — COMPREHENSIVE METABOLIC PANEL
ALT: 68 U/L — ABNORMAL HIGH (ref 0–44)
AST: 29 U/L (ref 15–41)
Albumin: 3.3 g/dL — ABNORMAL LOW (ref 3.5–5.0)
Alkaline Phosphatase: 94 U/L (ref 38–126)
Anion gap: 9 (ref 5–15)
BUN: 17 mg/dL (ref 6–20)
CO2: 27 mmol/L (ref 22–32)
Calcium: 8.8 mg/dL — ABNORMAL LOW (ref 8.9–10.3)
Chloride: 98 mmol/L (ref 98–111)
Creatinine, Ser: 0.88 mg/dL (ref 0.61–1.24)
GFR, Estimated: >60 mL/min (ref >60)
Glucose, Bld: 118 mg/dL — ABNORMAL HIGH (ref 70–99)
Potassium: 4.3 mmol/L (ref 3.5–5.1)
Sodium: 134 mmol/L — ABNORMAL LOW (ref 135–145)
Total Bilirubin: 0.9 mg/dL (ref 0.3–1.2)
Total Protein: 6.8 g/dL (ref 6.5–8.1)

## 2023-01-02 LAB — CBC
HCT: 45.4 % (ref 39.0–52.0)
Hemoglobin: 16.2 g/dL (ref 13.0–17.0)
MCH: 31.2 pg (ref 26.0–34.0)
MCHC: 35.7 g/dL (ref 30.0–36.0)
MCV: 87.3 fL (ref 80.0–100.0)
Platelets: 224 10*3/uL (ref 150–400)
RBC: 5.2 MIL/uL (ref 4.22–5.81)
RDW: 13.2 % (ref 11.5–15.5)
WBC: 6.3 10*3/uL (ref 4.0–10.5)
nRBC: 0 % (ref 0.0–0.2)

## 2023-01-02 LAB — MAGNESIUM: Magnesium: 1.9 mg/dL (ref 1.7–2.4)

## 2023-01-02 MED ORDER — HYDROMORPHONE HCL 1 MG/ML IJ SOLN
0.5000 mg | INTRAMUSCULAR | Status: DC | PRN
  Administered 2023-01-02 (×3): 0.5 mg via INTRAVENOUS
  Filled 2023-01-02 (×4): qty 0.5

## 2023-01-02 MED ORDER — OXYCODONE HCL 5 MG PO TABS: 10.0000 mg | ORAL_TABLET | Freq: Four times a day (QID) | ORAL | Status: DC | PRN

## 2023-01-02 MED ORDER — LIDOCAINE HCL 1 % IJ SOLN
INTRAMUSCULAR | Status: AC
  Filled 2023-01-02: qty 20

## 2023-01-02 NOTE — Progress Notes (Signed)
It looks like by his last chest x-ray that the pleural effusion on the left is trying to come back.  He has little bit more pain on the left side.  I would go ahead and do another thoracentesis on him.  I would have to leave that with this neuroendocrine carcinoma, the chemotherapy should start to work.  He is not as dizzy.  Again, maybe some of the dizziness may have been from the Duragesic patch.  Pain control seems to be doing fairly well right now.  He  seems to be eating okay.  He did have as much nausea.  He really wants to go outside.  I wish there is somewhere that he could go outside.  I know it is hard to have the staff go with him.  His labs show sodium 134.  Potassium 4.3.  BUN 17 creatinine 0.88.  Calcium 8.8 with an albumin of 3.3.  White cell count 6.3.  Hemoglobin 16.2.  Platelet count 224,000.  Is still awaiting the cytology report on the pleural fluid that was done on Friday.  Every time he has a thoracentesis.  I am going to send off a cytology.   He has had no fever.  Vital signs are temperature 98.5.  Pulse 109.  Blood pressure 123/88.  His lungs sound clear on the right side.  Again some decrease on the left side.  Cardiac exam tachycardic but regular.  Abdominal exam is soft.  Bowel sounds are present.  There is no fluid wave.  Extremity shows no clubbing, cyanosis or edema.  Neurological exam is nonfocal.   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  Again, this certainly would seem to be a small cell carcinoma.  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  This was by the immunohistochemical studies that was done on the pleural fluid.  We will continue to check his cytology.  Again, it would be nice to try to get him home.  His white cell count is dropping slowly but surely.  I am sure that he will become neutropenic soon.  I do appreciate the great care he is getting from everybody upon 6 E.   Christin Bach,  MD  Psalm 115:3

## 2023-01-02 NOTE — Progress Notes (Signed)
PROGRESS NOTE  Christopher Henson DTO:[id number] DOB: Jun 22, 1980   PCP: [name], PA-C  Patient is from: [facility].  DOA: 12/18/2022 LOS: 15  Chief complaints Chief Complaint  Patient presents with   Chest Pain     Brief Narrative / Interim history: 42 year old M with PMH of HIV/AIDS,  cryptococcal meningitis, alcohol use and tobacco use presenting with chest pain, shortness of breath, generalized weakness and unintentional weight loss, and admitted for anterior mediastinal mass.  Patient stated he has not been able to take his antiviral medications for about a week due to cost.  He was seen in ED on 9/3 and had chest x-ray that was concerning for mediastinal mass.  He was recommended to have CT but he left AMA.    In ED, CT angio chest negative for PE but 8 x 7.5 cm anterior mediastinal mass, 1.6 x 1.2 cm LUL nodule, interval increase in peribronchovascular groundglass airspace opacities within LUL and interval increase in size of small left pleural effusion.  CT abdomen and pelvis without significant finding.  Pulmonology and infectious disease consulted.  He underwent left thoracocentesis with removal of 800 cc exudative fluid.   Pleural fluid culture NGTD.  Cytology with malignant cells.  Underwent bronchoscopy on 9/12.  Biopsy consistent with malignancy.  Underwent bone marrow biopsy on 9/18.  Had a port a cath placed on 9/18 and started chemotherapy as well.  Patient also had elevated serum cryptococcal antigen for which she was started on Diflucan.  Underwent lumbar puncture on 9/13 with opening pressure of 21 cm water.  Fluid study negative for cryptococcal antigen.  Continued on Diflucan per ID.   Patient has recurrent left-sided pleural effusion requiring thoracocentesis on 9/20 and 9/24 with removal of 1 L and 720 cc fluid respectively.  Oncology following  Subjective: Seen and examined earlier this morning before he went down for thoracocentesis.  He was evaluated by oncology.   Thoracocentesis ordered.  Current pain is to endorse shortness of breath and dyspnea with exertion.  Feels pressure in his left chest.  Felt very short of breath and dizzy during ambulation for ambulatory saturation later in the morning.  Objective: Vitals:   01/02/23 0941 01/02/23 1030 01/02/23 1040 01/02/23 1049  BP:  (!) 144/88 127/85 (!) 130/100  Pulse:      Resp:      Temp:      TempSrc:      SpO2: (!) 85%     Weight:      Height:        Examination:  GENERAL: No apparent distress.  Nontoxic. HEENT: MMM.  Vision and hearing grossly intact.  NECK: Supple.  No apparent JVD.  RESP:  No IWOB.  Diminished aeration at left lung base. CVS:  RRR. Heart sounds normal.  ABD/GI/GU: BS+. Abd soft, NTND.  MSK/EXT:   No apparent deformity. Moves extremities. No edema.  SKIN: no apparent skin lesion or wound NEURO: Awake and alert. Oriented appropriately.  No apparent focal neuro deficit. PSYCH: Calm. Normal affect.    Procedures:  9/9-thoracocentesis with removal of 800 cc exudative fluid 9/12-bronchoscopy 9/13-lumbar puncture 9/18-port a cath placement 9/20-left-sided thoracocentesis with removal of 1000 cc fluid 9/24-left-sided thoracocentesis with removal of 720 cc fluid  Microbiology summarized: 9/9-COVID-19, influenza and RSV PCR nonreactive 9/9-blood culture with staph epidermis in 1 out of 4 bottles 9/9-pleural fluid cultures NGTD  Assessment and plan: Principal Problem:   Small cell lung cancer, left (HCC) Active Problems:   HIV (human immunodeficiency  virus infection) (HCC)   Pleural effusion on left   Hypokalemia   Alcoholism (HCC)   Smoker   Cryptococcal meningitis (HCC)   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  Pleural fluid with malignant cells.  Underwent bronchoscopy.  Pathology showed lung cancer.  MRI brain without acute finding.  Pathology from bone biopsy negative.   Port a cath placed on 9/18.  Completed first cycle of chemo.  Unfortunately with recurrent malignant effusion requiring repeat thoracocentesis. -May consider Pleurx cath if fluid reaccumulate's again -Oncology following.  Acute respiratory failure with hypoxia: Due to the above.  Had acute dyspnea with increased oxygen requirement on 9/19.  CXR with recurrent left pleural effusion. -S/p thoracocentesis on 9/9, 9/20 and 9/24 -Encouraged incentive parameter. -Continue weaning oxygen -Pain control -Consider Pleurx cath if fluid reaccumulate's -Ambulatory saturation   HIV: CD4 count 258.  Noncompliant with meds. -Appreciate ID recs-continue Biktarvy, Diflucan and Bactrim   Serum positive cryptococcal antigen: Underwent fluoroscopy guided LP with opening pressure of 21 cm water.  Fluid study negative for cryptococcal antigen. -ID recommends continuing Diflucan until outpatient follow-up on 10/16.   Anxiety: Understandably anxious -Continue Ativan as needed   Tobacco abuse: -Continue nicotine patch   Alcohol use: No signs of withdrawal -continue thiamine and folate.   Noncompliance: -Has been counseled.  Mild elevated LFT: Improved.   Hypokalemia: -Monitor replenish as appropriate   Blood cultures positive: for Staph epidermidis 1 out of 4 likely contaminant.  Leukocytosis: Likely demargination from steroid.  Improved.  Constipation: Resolved. -Bowel regimen-scheduled Senokot-S and lactulose  Hiccups: Improved. -IV Thorazine as needed.    Body mass index is 23.59 kg/m.           DVT prophylaxis:  enoxaparin (LOVENOX) injection 40 mg Start: 12/28/22 1000  Code Status: Full code Family Communication: None at bedside Level of care: Med-Surg Status is: Inpatient Remains inpatient appropriate because: Lung cancer, respiratory failure and recurrent pleural effusion   Final disposition: Home.  Planning to stay with his sister after discharge. Consultants:   Pulmonology Infectious disease Interventional radiology Oncology  35 minutes with more than 50% spent in reviewing records, counseling patient/family and coordinating care.   Sch Meds:  Scheduled Meds:  bictegravir-emtricitabine-tenofovir AF  1 tablet Oral Daily   Chlorhexidine Gluconate Cloth  6 each Topical Daily   dronabinol  5 mg Oral BID AC   enoxaparin (LOVENOX) injection  40 mg Subcutaneous Q24H   feeding supplement  237 mL Oral BID BM   fluconazole  200 mg Oral Daily   senna-docusate  1 tablet Oral BID   sulfamethoxazole-trimethoprim  1 tablet Oral Daily   Continuous Infusions:  sodium chloride     chlorproMAZINE (THORAZINE) 12.5 mg in sodium chloride 0.9 % 25 mL IVPB 12.5 mg (01/01/23 1351)   PRN Meds:.alum & mag hydroxide-simeth, bisacodyl, chlorproMAZINE (THORAZINE) 12.5 mg in sodium chloride 0.9 % 25 mL IVPB, HYDROmorphone (DILAUDID) injection, ipratropium-albuterol, lactulose, LORazepam, ondansetron **OR** ondansetron (ZOFRAN) IV, mouth rinse, oxyCODONE  Antimicrobials: Anti-infectives (From admission, onward)    Start     Dose/Rate Route Frequency Ordered Stop   01/02/23 0000  bictegravir-emtricitabine-tenofovir AF (BIKTARVY) 50-200-25 MG TABS tablet        1 tablet Oral Daily 01/01/23 1420     01/02/23 0000  fluconazole (DIFLUCAN) 200 MG tablet        200 mg Oral Daily 01/01/23 1420 02/01/23 2359   01/02/23 0000  sulfamethoxazole-trimethoprim (BACTRIM DS) 800-160  MG tablet        1 tablet Oral Daily 01/01/23 1420 02/01/23 2359   12/25/22 1145  sulfamethoxazole-trimethoprim (BACTRIM DS) 800-160 MG per tablet 1 tablet        1 tablet Oral Daily 12/25/22 1057     12/24/22 1000  Ampicillin-Sulbactam (UNASYN) 3 g in sodium chloride 0.9 % 100 mL IVPB        3 g 200 mL/hr over 30 Minutes Intravenous Every 6 hours 12/24/22 0929 12/25/22 1900   12/22/22 2030  fluconazole (DIFLUCAN) tablet 200 mg        200 mg Oral Daily 12/22/22 1915     12/18/22 2300   bictegravir-emtricitabine-tenofovir AF (BIKTARVY) 50-200-25 MG per tablet 1 tablet        1 tablet Oral Daily 12/18/22 1910     12/18/22 1100  Ampicillin-Sulbactam (UNASYN) 3 g in sodium chloride 0.9 % 100 mL IVPB  Status:  Discontinued        3 g 200 mL/hr over 30 Minutes Intravenous Every 6 hours 12/18/22 1054 12/24/22 0929   12/18/22 0500  sulfamethoxazole-trimethoprim (BACTRIM) 415 mg of trimethoprim in dextrose 5 % 500 mL IVPB  Status:  Discontinued        415 mg of trimethoprim 350.6 mL/hr over 90 Minutes Intravenous Every 6 hours 12/18/22 0427 12/18/22 1051        I have personally reviewed the following labs and images: CBC: Recent Labs  Lab 12/27/22 1430 12/28/22 0449 12/29/22 0600 12/30/22 0525 12/31/22 0500 01/01/23 0625 01/02/23 0500  WBC 11.1* 17.5* 29.5* 22.0* 17.6* 12.1* 6.3  NEUTROABS 8.3* 15.9* 27.3* 21.0* 16.3*  --   --   HGB 15.6 16.4 15.9 15.8 15.0 16.0 16.2  HCT 44.6 46.6 45.4 44.7 42.2 45.0 45.4  MCV 89.9 90.5 90.4 88.9 88.5 87.9 87.3  PLT 246 273 270 267 273 286 224   BMP &GFR Recent Labs  Lab 12/28/22 0449 12/29/22 0600 12/30/22 0525 01/01/23 0625 01/02/23 0500  NA 138 140 136 135 134*  K 4.1 4.5 4.0 4.2 4.3  CL 105 106 101 99 98  CO2 21* 24 24 26 27   GLUCOSE 129* 123* 134* 95 118*  BUN 16 17 18  22* 17  CREATININE 1.01 0.97 0.94 0.96 0.88  CALCIUM 9.6 9.5 9.3 9.0 8.8*  MG  --   --   --  2.2 1.9   Estimated Creatinine Clearance: 117.6 mL/min (by C-G formula based on SCr of 0.88 mg/dL). Liver & Pancreas: Recent Labs  Lab 12/28/22 0449 12/29/22 0600 12/30/22 0525 01/01/23 0625 01/02/23 0500  AST 41 34 31 32 29  ALT 49* 54* 55* 67* 68*  ALKPHOS 91 102 93 95 94  BILITOT 0.9 0.9 0.9 1.3* 0.9  PROT 8.0 7.7 7.1 7.2 6.8  ALBUMIN 4.2 3.8 3.6 3.4* 3.3*   No results for input(s): "LIPASE", "AMYLASE" in the last 168 hours. No results for input(s): "AMMONIA" in the last 168 hours. Diabetic: No results for input(s): "HGBA1C" in the last 72  hours. No results for input(s): "GLUCAP" in the last 168 hours. Cardiac Enzymes: No results for input(s): "CKTOTAL", "CKMB", "CKMBINDEX", "TROPONINI" in the last 168 hours. No results for input(s): "PROBNP" in the last 8760 hours. Coagulation Profile: No results for input(s): "INR", "PROTIME" in the last 168 hours.  Thyroid Function Tests: No results for input(s): "TSH", "T4TOTAL", "FREET4", "T3FREE", "THYROIDAB" in the last 72 hours. Lipid Profile: No results for input(s): "CHOL", "HDL", "LDLCALC", "TRIG", "CHOLHDL", "LDLDIRECT" in the last  72 hours.  Anemia Panel: No results for input(s): "VITAMINB12", "FOLATE", "FERRITIN", "TIBC", "IRON", "RETICCTPCT" in the last 72 hours. Urine analysis:    Component Value Date/Time   COLORURINE STRAW (A) 12/18/2022 0552   APPEARANCEUR CLEAR 12/18/2022 0552   LABSPEC 1.025 12/18/2022 0552   PHURINE 7.0 12/18/2022 0552   GLUCOSEU NEGATIVE 12/18/2022 0552   HGBUR NEGATIVE 12/18/2022 0552   BILIRUBINUR NEGATIVE 12/18/2022 0552   KETONESUR NEGATIVE 12/18/2022 0552   PROTEINUR NEGATIVE 12/18/2022 0552   NITRITE NEGATIVE 12/18/2022 0552   LEUKOCYTESUR NEGATIVE 12/18/2022 0552   Sepsis Labs: Invalid input(s): "PROCALCITONIN", "LACTICIDVEN"  Microbiology: No results found for this or any previous visit (from the past 240 hour(s)).   Radiology Studies: DG Chest 2 View  Result Date: 01/01/2023 CLINICAL DATA:  Left chest pain. EXAM: CHEST - 2 VIEW COMPARISON:  Chest radiograph 12/29/2022 and chest CTA 12/18/2022 FINDINGS: A right jugular Port-A-Cath remains in place and terminates over the lower SVC. The cardiomediastinal silhouette is unchanged with a large left-sided mediastinal/suprahilar mass again noted. A moderate-sized left pleural effusion has mildly enlarged with associated left basilar atelectasis or consolidation. The interstitial markings are mildly prominent without overt edema. No pneumothorax is identified. IMPRESSION: Mildly increased  size of a moderate left pleural effusion with left basilar atelectasis or consolidation. Electronically Signed   By: Sebastian Ache M.D.   On: 01/01/2023 16:29      Katerin Negrete T. Willy Pinkerton [facility]  If 7PM-7AM, please contact night-coverage [email or web address] 01/02/2023, 12:36 PM

## 2023-01-02 NOTE — TOC Initial Note (Signed)
Transition of Care [facility]) - Initial/Assessment Note    Patient Details  Name: Christopher Henson MRN: [id number] Date of Birth: 08/29/1980  [facility] CM/SW Contact:    Beckie Busing, RN Phone [phone]  01/02/2023, 4:14 PM  Clinical Narrative:                 [facility] acknowledges consult for patient requesting that he needs to talk to case manager about issues with regard to work and getting oxygen. Patient states that he has been told that he would not be able to work and that he understands he will need to apply for disability. Patient questions if TOC has any resources for emergency housing because he has been living in a motel. CM has advices patient that Cm has resources for shelter but no housing. Patient states that he is not interested in going to shelter. CM inquired about questions pertaining to O2. Patient states that he doesn't know if he will need O2 at discharge. CM has assured patient that nursing will evaluate for home O2 and update CM if O2 is needed. Currently there are no clear TOC needs. TOC will continue to follow.   Expected Discharge Plan: Home/Self Care Barriers to Discharge: Continued Medical Work up   Patient Goals and CMS Choice Patient states their goals for this hospitalization and ongoing recovery are:: Wants to get better to go home CMS Medicare.gov Compare Post Acute Care list provided to::  (n/a) Choice offered to / list presented to : NA [place] ownership interest in [facility].provided to::  (n/a)    Expected Discharge Plan and Services In-house Referral: NA Discharge Planning Services: CM Consult Post Acute Care Choice: NA Living arrangements for the past 2 months: Hotel/Motel                 DME Arranged: N/A DME Agency: NA       HH Arranged: NA HH Agency: NA        Prior Living Arrangements/Services Living arrangements for the past 2 months: Hotel/Motel Lives with:: Self Patient language and need for  interpreter reviewed:: Yes Do you feel safe going back to the place where you live?: Yes      Need for Family Participation in Patient Care: Yes (Comment) Care giver support system in place?: No (comment) Current home services:  (n/a) Criminal Activity/Legal Involvement Pertinent to Current Situation/Hospitalization: No - Comment as needed  Activities of Daily Living Home Assistive Devices/Equipment: None ADL Screening (condition at time of admission) Is the patient deaf or have difficulty hearing?: No Does the patient have difficulty seeing, even when wearing glasses/contacts?: No Does the patient have difficulty concentrating, remembering, or making decisions?: No  Permission Sought/Granted Permission sought to share information with : Family Supports Permission granted to share information with : No              Emotional Assessment Appearance:: Appears stated age Attitude/Demeanor/Rapport: Gracious Affect (typically observed): Quiet, Pleasant Orientation: : Oriented to Self, Oriented to Place, Oriented to  Time, Oriented to Situation Alcohol / Substance Use: Not Applicable Psych Involvement: No (comment)  Admission diagnosis:  Mediastinal mass [J98.59] AIDS (acquired immune deficiency syndrome) (HCC) [B20] Patient Active Problem List   Diagnosis Date Noted   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   Small cell lung cancer, 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   Mediastinal mass 12/18/2022   Pleural effusion on left 12/18/2022   Hypokalemia 12/18/2022   Alcoholism (HCC) 12/18/2022   Smoker 12/18/2022  Routine screening for STI (sexually transmitted infection) 06/08/2022   Encounter for long-term (current) use of high-risk medication 06/08/2022   Dental abscess 11/28/2019   Need for hepatitis A and B vaccination 02/25/2019   Thrombocytopenia (HCC) 02/25/2019   Elevated liver enzymes 02/25/2019   Influenza vaccination declined 02/25/2019   Alcohol use 02/21/2019   Depression  12/10/2018   Poor dentition 10/03/2018   HIV (human immunodeficiency virus infection) (HCC) 09/05/2018   Protein-calorie malnutrition, severe 06/10/2018   History of cryptococcal meningitis    PCP:  [name], PA-C Pharmacy:   [id number] - [place], [place] - [address] AT [facility] [address] & [address] [place] [address] Phone: [phone] Fax: [phone]  [place] [facility] [address] [place] [address] Phone: [phone] Fax: [phone]     Social Determinants of Health (SDOH) Social History: SDOH Screenings   Food Insecurity: No Food Insecurity (12/18/2022)  Housing: Low Risk  (12/18/2022)  Transportation Needs: No Transportation Needs (12/18/2022)  Utilities: Not At Risk (12/18/2022)  Depression (PHQ2-9): Low Risk  (06/08/2022)  Tobacco Use: High Risk (12/21/2022)   SDOH Interventions:     Readmission Risk Interventions    01/02/2023    4:06 PM  Readmission Risk Prevention Plan  Transportation Screening Complete  PCP or Specialist Appt within 3-5 Days Complete  HRI or Home Care Consult Complete  Social Work Consult for Recovery Care Planning/Counseling Complete  Palliative Care Screening Complete  Medication Review Oceanographer) Complete

## 2023-01-02 NOTE — Procedures (Signed)
Ultrasound-guided diagnostic and therapeutic left thoracentesis performed yielding 720 cc of bloody fluid. No immediate complications. Follow-up chest x-ray pending. The fluid was sent to the lab for cytology. EBL< 2 cc.

## 2023-01-03 ENCOUNTER — Encounter (HOSPITAL_COMMUNITY): Payer: Self-pay

## 2023-01-03 ENCOUNTER — Encounter: Payer: Self-pay | Admitting: *Deleted

## 2023-01-03 ENCOUNTER — Other Ambulatory Visit (HOSPITAL_COMMUNITY): Payer: Self-pay

## 2023-01-03 DIAGNOSIS — C3492 Malignant neoplasm of unspecified part of left bronchus or lung: Secondary | ICD-10-CM

## 2023-01-03 DIAGNOSIS — B20 Human immunodeficiency virus [HIV] disease: Secondary | ICD-10-CM

## 2023-01-03 DIAGNOSIS — E43 Unspecified severe protein-calorie malnutrition: Secondary | ICD-10-CM

## 2023-01-03 DIAGNOSIS — J9859 Other diseases of mediastinum, not elsewhere classified: Secondary | ICD-10-CM

## 2023-01-03 LAB — CBC
HCT: 44.3 % (ref 39.0–52.0)
Hemoglobin: 16 g/dL (ref 13.0–17.0)
MCH: 31.9 pg (ref 26.0–34.0)
MCHC: 36.1 g/dL — ABNORMAL HIGH (ref 30.0–36.0)
MCV: 88.4 fL (ref 80.0–100.0)
Platelets: 189 10*3/uL (ref 150–400)
RBC: 5.01 MIL/uL (ref 4.22–5.81)
RDW: 13 % (ref 11.5–15.5)
WBC: 4.8 10*3/uL (ref 4.0–10.5)
nRBC: 0 % (ref 0.0–0.2)

## 2023-01-03 LAB — RENAL FUNCTION PANEL
Albumin: 3.1 g/dL — ABNORMAL LOW (ref 3.5–5.0)
Anion gap: 8 (ref 5–15)
BUN: 16 mg/dL (ref 6–20)
CO2: 25 mmol/L (ref 22–32)
Calcium: 8.6 mg/dL — ABNORMAL LOW (ref 8.9–10.3)
Chloride: 98 mmol/L (ref 98–111)
Creatinine, Ser: 0.89 mg/dL (ref 0.61–1.24)
GFR, Estimated: >60 mL/min (ref >60)
Glucose, Bld: 119 mg/dL — ABNORMAL HIGH (ref 70–99)
Phosphorus: 3.1 mg/dL (ref 2.5–4.6)
Potassium: 4.5 mmol/L (ref 3.5–5.1)
Sodium: 131 mmol/L — ABNORMAL LOW (ref 135–145)

## 2023-01-03 LAB — MAGNESIUM: Magnesium: 2 mg/dL (ref 1.7–2.4)

## 2023-01-03 MED ORDER — HEPARIN SOD (PORK) LOCK FLUSH 100 UNIT/ML IV SOLN
500.0000 [IU] | Freq: Once | INTRAVENOUS | Status: AC
  Administered 2023-01-03: 500 [IU] via INTRAVENOUS
  Filled 2023-01-03: qty 5

## 2023-01-03 MED ORDER — HYDROMORPHONE HCL 2 MG PO TABS
2.0000 mg | ORAL_TABLET | ORAL | Status: DC | PRN
  Administered 2023-01-03: 2 mg via ORAL
  Filled 2023-01-03: qty 1

## 2023-01-03 MED ORDER — SENNOSIDES-DOCUSATE SODIUM 8.6-50 MG PO TABS: 1.0000 | ORAL_TABLET | Freq: Two times a day (BID) | ORAL | Status: DC | PRN

## 2023-01-03 MED ORDER — BENZONATATE 100 MG PO CAPS
100.0000 mg | ORAL_CAPSULE | Freq: Three times a day (TID) | ORAL | 0 refills | Status: DC | PRN
  Filled 2023-01-03: qty 20, 7d supply, fill #0

## 2023-01-03 MED ORDER — BENZONATATE 100 MG PO CAPS
100.0000 mg | ORAL_CAPSULE | Freq: Three times a day (TID) | ORAL | Status: DC | PRN
  Administered 2023-01-03: 100 mg via ORAL
  Filled 2023-01-03: qty 1

## 2023-01-03 NOTE — Progress Notes (Signed)
SATURATION QUALIFICATIONS: (This note is used to comply with regulatory documentation for home oxygen)  Patient Saturations on Room Air at Rest = 90%  Patient Saturations on Room Air while Ambulating = 86%  Patient Saturations on 2 Liters of oxygen while Ambulating = 99%  Patient needs home oxygen, he is short of breath while ambulating on room air and would benefit from Home O2.

## 2023-01-03 NOTE — Progress Notes (Signed)
Christopher Henson had 720 cc of fluid taken off the left lung yesterday.  He does feel better.  He has a little bit of a cough.  He really is not that productive.  He may benefit from some [employer].  I think that he is ready for discharge.  I suspect he probably needs some oxygen at home.  I am unsure how he can get portable oxygen at home.  He is on Medicaid.  Maybe they will pay for a portable oxygen tank.  He is eating well.  There is no nausea or vomiting.  He has had no mouth sores.  He is going to the bathroom.  He still may be a little bit constipated.  Pain is doing okay.  He says that he the oxycodone was not that effective.  The Dilaudid seems to be working better.  We will switch him over to oral Dilaudid.  His labs show sodium 131.  Potassium 4.5.  BUN 16 creatinine 0.89.  Calcium 8.6 with an albumin of 3.1.  His white cell count is 4.8.  Hemoglobin 16.  Platelet count 189,000.  He has had no obvious bleeding.  He has had no fever.  He has had no mouth sores.  There is been no headache.  He is ambulating.  He would much rather ambulate outside.  His vital signs with temperature of 98.4.  Pulse 107.  Blood pressure 129/90.  Head and neck exam shows no ocular or oral lesions.  He has no adenopathy in the neck.  Lungs still show some slight decreased breath sounds over on the left side.  Right side is clear.  There is no wheezing.  Cardiac exam is tachycardic but regular.  There are no murmurs.  Abdomen is soft.  Bowel sounds are present.  There is no fluid wave.  There is no palpable liver or spleen tip.  Extremity shows no clubbing, cyanosis or edema.  Neurological exam is nonfocal.   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  He has underlying HIV.  Thankfully, the HIV is under good control.  He is followed by ID for this.  He has had 1 cycle of chemotherapy.  He got carboplatinum/etoposide.  In the office, we will have to utilize cisplatin/etoposide/immunotherapy.  I will have him  come back to the office in about a week just to see how he is doing.  We will probably get a chest x-ray on him at the time that we see him.  We will check labs.  His white cell count is okay right now.  He is on prophylactic antibiotics for the HIV.  I do appreciate the incredible care he got from everybody upon 6 E.   Christin Bach, MD  Psalm 139:10

## 2023-01-03 NOTE — TOC Transition Note (Signed)
Transition of Care [facility]) - CM/SW Discharge Note   Patient Details  Name: Christopher Henson MRN: [id number] Date of Birth: 1980-07-24  Transition of Care [facility]) CM/SW Contact:  Howell Rucks, RN Phone Number: 01/03/2023, 10:41 AM   Clinical Narrative:  New order for Home 02, [employer] rep-[employer]ermaine, to be delivered to bedside prior to dc. Met with pt at bedside, pt/ pt's sister requesting resources for a bed, reports pt is going to live with a relative but they are in need of a bed, pt/family member given resources (goodwill, Holiday representative, Research scientist (physical sciences) Stores) NCM provided pt's sister a Quarry manager programs that provided discounted or no cost furniture. Sister reports pt has transportation available for dc. Pt inquired about the status of his mobile phone that was broken during hospitalization, NCM spoke with charge nurse, charge nurse reports Risk Management is following and will contact patient, charge nurse given contact number provided by pt for follow up: [phone]. No further TOC needs identified.     Final next level of care: Home/Self Care Barriers to Discharge: Barriers Resolved   Patient Goals and CMS Choice CMS Medicare.gov Compare Post Acute Care list provided to::  (n/a) Choice offered to / list presented to : NA  Discharge Placement                      Patient and family notified of of transfer: 01/03/23  Discharge Plan and Services Additional resources added to the After Visit Summary for   In-house Referral: NA Discharge Planning Services: CM Consult Post Acute Care Choice: NA          DME Arranged: N/A DME Agency: [employer] Date DME Agency Contacted: 01/03/23 Time DME Agency Contacted: 1040 Representative spoke with at DME Agency: Vaughan Basta HH Arranged: NA HH Agency: NA        Social Determinants of Health (SDOH) Interventions SDOH Screenings   Food Insecurity: No Food Insecurity (12/18/2022)  Housing: Low Risk  (12/18/2022)  Transportation Needs:  No Transportation Needs (12/18/2022)  Utilities: Not At Risk (12/18/2022)  Depression (PHQ2-9): Low Risk  (06/08/2022)  Tobacco Use: High Risk (12/21/2022)     Readmission Risk Interventions    01/02/2023    4:06 PM  Readmission Risk Prevention Plan  Transportation Screening Complete  PCP or Specialist Appt within 3-5 Days Complete  HRI or Home Care Consult Complete  Social Work Consult for Recovery Care Planning/Counseling Complete  Palliative Care Screening Complete  Medication Review Oceanographer) Complete

## 2023-01-03 NOTE — Plan of Care (Addendum)
Problem: Activity: Goal: Ability to tolerate increased activity will improve Outcome: Completed/Met   Problem: Clinical Measurements: Goal: Ability to maintain a body temperature in the normal range will improve Outcome: Completed/Met   Problem: Respiratory: Goal: Ability to maintain adequate ventilation will improve Outcome: Completed/Met Goal: Ability to maintain a clear airway will improve Outcome: Completed/Met   Problem: Education: Goal: Knowledge of General Education information will improve Description: Including pain rating scale, medication(s)/side effects and non-pharmacologic comfort measures Outcome: Completed/Met   Problem: Health Behavior/Discharge Planning: Goal: Ability to manage health-related needs will improve Outcome: Completed/Met   Problem: Clinical Measurements: Goal: Ability to maintain clinical measurements within normal limits will improve Outcome: Completed/Met Goal: Will remain free from infection Outcome: Completed/Met Goal: Diagnostic test results will improve Outcome: Completed/Met Goal: Respiratory complications will improve Outcome: Completed/Met Goal: Cardiovascular complication will be avoided Outcome: Completed/Met   Problem: Activity: Goal: Risk for activity intolerance will decrease Outcome: Completed/Met   Problem: Nutrition: Goal: Adequate nutrition will be maintained Outcome: Completed/Met   Problem: Coping: Goal: Level of anxiety will decrease Outcome: Completed/Met   Problem: Elimination: Goal: Will not experience complications related to bowel motility Outcome: Completed/Met Goal: Will not experience complications related to urinary retention Outcome: Completed/Met   Problem: Pain Managment: Goal: General experience of comfort will improve Outcome: Completed/Met   Problem: Safety: Goal: Ability to remain free from injury will improve Outcome: Completed/Met   Problem: Skin Integrity: Goal: Risk for impaired  skin integrity will decrease Outcome: Completed/Met

## 2023-01-03 NOTE — Discharge Summary (Signed)
Physician Discharge Summary  Christopher Henson XLK:[id number] DOB: [birth year] DOA: 12/18/2022  PCP: Christopher Canavan, PA-C  Admit date: 12/18/2022 Discharge date: 01/03/2023  Time spent: 60 minutes  Recommendations for Outpatient Follow-up:  Requires close outpatient follow-up with oncologist with regards to treatment options--- requires repeat chest x-ray in 5 to 7 days in the outpatient setting and may require repeat thoracentesis-CC Dr. Myna Henson Follow cytology as an outpatient on the pleural fluid to differentiate large cell neuroendocrine versus small cell CA Get Chem-7 CBC in about 1 week Outpatient viral loads etc. as per ID-CC Dr. 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  Mediastinal mass left AMA 9/9 presented hypotensive slightly tachycardic with chest pain mild dyspnea, weight loss 4 weeks night sweats no fever no abdominal pain-white count 11 lactic acid 2.7 potassium 3.1  CTA chest abdomen 8.0 X7.5 anterior mediastinal mass extending to left hilar region, left upper lobe nodule additionally 1.6X 1.2 cm ID as well as CCM consulted 9/9 thoracentesis 800 cc with malignant cells consistent with [facility] 9/12 bronchoscopy malignancy and organism morphology consistent with cryptococcus 12/21/2020 for lumbar puncture ultrasound-guided showed cryptococcal antigen negative 12/25/2022 brain MRI showed no acute findings no metastatic  disease. 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  LAN and recurrent malignant pleural effusion MR brain no metastases--Path confirms lung cancer-chemo as above given here Repeat thoracentesis performed and he feels about 60% of his normal although still a little bit winded I have communicated with both oncology and interventional radiology to ensure that he is plugged in as he has had 3 thoracenteses this hospital stay and may require Pleurx drain if this recurs I have also indicated with the patient and if he has further shortness of breath or prompt recurrence he needs to come back to the emergency room and be evaluated  Respiratory failure on admission secondary to above Multiple thoracenteses Needs 1 week follow-up with Dr. 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 Luciana Henson at 10:30 AM I have CCed his outpatient physicians  Tobacco alcohol abuse  Depression-situational    Discharge Exam: Vitals:   01/03/23 0252 01/03/23 0637  BP: 121/83 (!) 129/90  Pulse:  (!) 107  Resp: 16 16  Temp: 98.3 F (36.8 C) 98.4 F (36.9 C)  SpO2: 99% 96%    Subj on day of d/c   A little depressed no distress Ambulatory not on oxygen when I was in the room  General Exam on discharge  EOMI  NCAT no focal deficit Chest seems a little bit dull to percussion on the left side but air entry is reasonable S1-S2 slightly tachycardic Abdomen soft no rebound no guarding   Discharge Instructions   Discharge Instructions     Infusion Appointment Request   Complete by: Dec 29, 2022    Contact your oncology clinic or infusion center to schedule this appointment.   IS Injection Appt Request (15 minutes)   Complete by: Dec 31, 2022    Contact your oncology clinic or infusion center to schedule this appointment.      Allergies as of 01/03/2023   No Known Allergies      Medication List     TAKE these medications    benzonatate 100 MG capsule Commonly known as: TESSALON Take 1 capsule (100 mg total) by mouth 3 (three) times daily as needed for cough.   Biktarvy 50-200-25 MG Tabs tablet Generic drug: bictegravir-emtricitabine-tenofovir AF Take 1 tablet by mouth daily.   fluconazole 200 MG tablet Commonly known as: DIFLUCAN Take 1 tablet (200 mg total) by mouth daily for 30 doses.   senna-docusate 8.6-50 MG tablet Commonly known as: Senokot-S Take 1 tablet by mouth 2 (two) times daily between meals as needed for mild constipation.   sulfamethoxazole-trimethoprim 800-160 MG tablet Commonly known as: BACTRIM DS Take 1 tablet by mouth daily.               Durable Medical Equipment  (From admission, onward)           Start     Ordered   01/03/23 1015  DME Oxygen  Once       Question Answer Comment  Length of Need Lifetime   Mode or (Route) Nasal cannula   Liters per Minute 3   Frequency Continuous (stationary and portable oxygen unit needed)   Oxygen delivery system Gas      01/03/23 1016           No Known Allergies  Follow-up Information     Christopher Canavan, PA-C. Schedule an appointment as soon as possible for a visit in 1 week(s).   Specialty: Family Medicine Contact information: [address] [place]  [address] [phone]                  The results of significant diagnostics from this hospitalization (including imaging, microbiology, ancillary and laboratory) are listed below for reference.    Significant Diagnostic Studies: US THORACENTESIS ASP PLEURAL SPACE W/IMG GUIDE  Result Date: 01/02/2023 INDICATION: Patient with history of HIV/AIDS, prior cryptococcal meningitis ,extensive small cell lung cancer, dyspnea, recurrent left pleural effusion; request received for diagnostic and therapeutic left thoracentesis EXAM: ULTRASOUND GUIDED DIAGNOSTIC AND THERAPEUTIC LEFT THORACENTESIS MEDICATIONS: 8 mL 1% lidocaine COMPLICATIONS: None immediate. PROCEDURE: An ultrasound guided thoracentesis was thoroughly discussed with the patient and questions answered. The benefits, risks, alternatives and complications were also discussed. The patient understands and wishes to proceed with the procedure. Written consent was obtained. Ultrasound was performed to localize and mark an adequate pocket of fluid in the LEFT chest. The area was then prepped and draped in the normal sterile fashion. 1% Lidocaine was used for local anesthesia. Under ultrasound guidance a 6 Fr Safe-T-Centesis catheter was introduced. Thoracentesis was performed. The catheter was removed and a dressing applied. FINDINGS: A total of approximately 720 mL of bloody fluid was removed. Samples were sent to the laboratory as requested by the clinical team.  IMPRESSION: Successful ultrasound guided diagnostic and therapeutic LEFT thoracentesis yielding 720 mL of pleural fluid. Performed by: Artemio Aly Electronically Signed   By: Roanna Banning M.D.   On: 01/02/2023 16:13   DG Chest 1 View  Result Date: 01/02/2023 CLINICAL DATA:  [id number] S/P thoracentesis [id number] EXAM: CHEST  1 VIEW COMPARISON:  January 01, 2023 FINDINGS: The cardiomediastinal silhouette is unchanged in contour with a LEFT paramediastinal mass.RIGHT chest port with tip  terminating over the superior cavoatrial junction. Decreased LEFT pleural effusion with small residual LEFT-sided pleural effusion. No pneumothorax. Similar appearance of diffuse reticular prominence. Similar appearance of the LEFT basilar homogeneous opacity, likely atelectasis. IMPRESSION: Decreased LEFT pleural effusion with small residual LEFT-sided pleural effusion. No pneumothorax. Electronically Signed   By: Meda Klinefelter M.D.   On: 01/02/2023 13:15   DG Chest 2 View  Result Date: 01/01/2023 CLINICAL DATA:  Left chest pain. EXAM: CHEST - 2 VIEW COMPARISON:  Chest radiograph 12/29/2022 and chest CTA 12/18/2022 FINDINGS: A right jugular Port-A-Cath remains in place and terminates over the lower SVC. The cardiomediastinal silhouette is unchanged with a large left-sided mediastinal/suprahilar mass again noted. A moderate-sized left pleural effusion has mildly enlarged with associated left basilar atelectasis or consolidation. The interstitial markings are mildly prominent without overt edema. No pneumothorax is identified. IMPRESSION: Mildly increased size of a moderate left pleural effusion with left basilar atelectasis or consolidation. Electronically Signed   By: Sebastian Ache M.D.   On: 01/01/2023 16:29   US THORACENTESIS ASP PLEURAL SPACE W/IMG GUIDE  Result Date: 12/29/2022 INDICATION: Patient with history of HIV/AIDS, prior cryptococcal meningitis, extensive small cell lung cancer, dyspnea, left pleural effusion. Request received for diagnostic and therapeutic left thoracentesis. EXAM: ULTRASOUND GUIDED DIAGNOSTIC AND THERAPEUTIC LEFT THORACENTESIS MEDICATIONS: 8 mL 1% lidocaine COMPLICATIONS: None immediate. PROCEDURE: An ultrasound guided thoracentesis was thoroughly discussed with the patient and questions answered. The benefits, risks, alternatives and complications were also discussed. The patient understands and wishes to proceed with the procedure. Written consent was obtained.  Ultrasound was performed to localize and mark an adequate pocket of fluid in the left chest. The area was then prepped and draped in the normal sterile fashion. 1% Lidocaine was used for local anesthesia. Under ultrasound guidance a 6 Fr Safe-T-Centesis catheter was introduced. Thoracentesis was performed. The catheter was removed and a dressing applied. FINDINGS: A total of approximately 1 liter of hazy, blood-tinged fluid was removed. Samples were sent to the laboratory as requested by the clinical team. IMPRESSION: Successful ultrasound guided diagnostic and therapeutic left thoracentesis yielding 1 liter of pleural fluid. Performed by: Artemio Aly Electronically Signed   By: Marliss Coots M.D.   On: 12/29/2022 12:27   DG CHEST PORT 1 VIEW  Result Date: 12/29/2022 CLINICAL DATA:  [id number] Status post thoracentesis [id number] EXAM: PORTABLE CHEST 1 VIEW COMPARISON:  CXR 12/28/22 FINDINGS: Small left pleural effusion, slightly decreased from prior exam. Unchanged cardiac and mediastinal contours. Unchanged appearance of the left mediastinal mass. No pneumothorax. No radiographically apparent displaced rib fractures. Visualized upper abdomen unremarkable. Right-sided chest port in place with the tip near the cavoatrial junction. IMPRESSION: Small left pleural effusion, slightly decreased from prior exam. No pneumothorax. Electronically Signed   By: Lorenza Cambridge M.D.   On: 12/29/2022 10:55   DG Chest Port 1 View  Result Date: 12/28/2022 CLINICAL DATA:  Shortness of breath EXAM: PORTABLE CHEST 1 VIEW COMPARISON:  12/27/2022, CT from 12/18/2022 FINDINGS: Cardiac shadow is stable. Mediastinal mass is again noted on  the left lung along the aortic knob. Right chest wall port is noted in satisfactory position. Left pleural effusion is noted similar to that seen on prior chest CT. IMPRESSION: Stable appearance of the chest with mediastinal mass and left pleural effusion. Electronically Signed   By: Alcide Clever M.D.    On: 12/28/2022 21:43   DG CHEST PORT 1 VIEW  Result Date: 12/27/2022 CLINICAL DATA:  Chest pain and dyspnea. EXAM: PORTABLE CHEST 1 VIEW COMPARISON:  12/20/2012 FINDINGS: Unchanged cardiac silhouette and mediastinal contours including known anterior mediastinal mass. Interval increase in now moderate-sized left-sided effusion worsening left basilar heterogeneous/consolidative opacities. The right hemithorax remains well aerated. Interval placement of right jugular approach port a catheter with tip projected over the superior cavoatrial junction. No pneumothorax. No evidence of edema. No acute osseous abnormalities. IMPRESSION: 1. Interval increase in now moderate-sized left-sided effusion with associated worsening left basilar heterogeneous/consolidative opacities. 2. Interval placement of right jugular approach port a catheter with tip projected over the superior cavoatrial junction. No pneumothorax. 3. Redemonstrated known left-sided anterior mediastinal mass. Electronically Signed   By: Simonne Come M.D.   On: 12/27/2022 16:23   IR BONE MARROW BIOPSY & ASPIRATION  Result Date: 12/27/2022 INDICATION: Recent diagnosis lung cancer now with concern for involvement bone marrow. Please perform image guided bone marrow biopsy for tissue diagnostic purposes. Additionally, please perform image guided placement port a catheter for chemotherapy administration. EXAM: 1. FLUOROSCOPIC GUIDED BONE MARROW BIOPSY AND ASPIRATION 2. IMAGE GUIDED PORT A CATHETER PLACEMENT MEDICATIONS: None ANESTHESIA/SEDATION: Moderate (conscious) sedation was employed during this procedure as administered by the Interventional Radiology RN. A total of Versed 1.5 mg and Fentanyl 75 mcg was administered intravenously. Moderate Sedation Time: 57 minutes. The patient's level of consciousness and vital signs were monitored continuously by radiology nursing throughout the procedure under my direct supervision. FLUOROSCOPY TIME: 1 minute, 39  seconds (8 mGy) COMPLICATIONS: None immediate. PROCEDURE: Informed consent was obtained from the patient following an explanation of the procedure, risks, benefits and alternatives. The patient understands, agrees and consents for the procedure. All questions were addressed. A time out was performed prior to the initiation of the procedure. The patient was positioned prone on the fluoroscopy table and the posterior aspect of the right iliac crest was marked fluoroscopically. The operative site was prepped and draped in the usual sterile fashion. Under sterile conditions and local anesthesia, an 11 gauge coaxial bone biopsy needle was advanced into the posterior aspect of the right iliac marrow space under intermittent fluoroscopic guidance. Multiple fluoroscopic images were saved procedural documentation purposes. Initially, a bone marrow aspiration was performed. Next, a bone marrow biopsy was obtained with the 11 gauge outer bone marrow device. The needle was removed and superficial hemostasis was obtained with manual compression. A dressing was applied. The patient tolerated the procedure well without immediate post procedural complication. Attention was now paid towards placement of the port a catheter. The right neck and chest were prepped with chlorhexidine in a sterile fashion, and a sterile drape was applied covering the operative field. Maximum barrier sterile technique with sterile gowns and gloves were used for the procedure. A timeout was performed prior to the initiation of the procedure. Local anesthesia was provided with 1% lidocaine with epinephrine. After creating a small venotomy incision, a micropuncture kit was utilized to access the internal jugular vein. Real-time ultrasound guidance was utilized for vascular access including the acquisition of a permanent ultrasound image documenting patency of the accessed vessel. The microwire  was utilized to measure appropriate catheter length. A  subcutaneous port pocket was then created along the upper chest wall utilizing a combination of sharp and blunt dissection. The pocket was irrigated with sterile saline. A single lumen [employer] power injectable port was chosen for placement. The 8 Fr catheter was tunneled from the port pocket site to the venotomy incision. The port was placed in the pocket. The external catheter was trimmed to appropriate length. At the venotomy, an 8 Fr peel-away sheath was placed over a guidewire under fluoroscopic guidance. The catheter was then placed through the sheath and the sheath was removed. Final catheter positioning was confirmed and documented with a fluoroscopic spot radiograph. The port was accessed with a Huber needle, aspirated and flushed. The venotomy site was closed with an interrupted 4-0 Vicryl suture. The port pocket incision was closed with interrupted 2-0 Vicryl suture. Dermabond and Steri-strips were applied to both incisions. Port a Catheter was left accessed for in hospitalization usage. Dressings were applied. The patient tolerated the procedure well without immediate post procedural complication. IMPRESSION: 1. Successful fluoroscopic guided right iliac bone marrow aspiration and core biopsy. 2. Successful image guided Port a catheter placement. Port a catheter is ready immediate usage. Electronically Signed   By: Simonne Come M.D.   On: 12/27/2022 16:21   IR IMAGING GUIDED PORT INSERTION  Result Date: 12/27/2022 INDICATION: Recent diagnosis lung cancer now with concern for involvement bone marrow. Please perform image guided bone marrow biopsy for tissue diagnostic purposes. Additionally, please perform image guided placement port a catheter for chemotherapy administration. EXAM: 1. FLUOROSCOPIC GUIDED BONE MARROW BIOPSY AND ASPIRATION 2. IMAGE GUIDED PORT A CATHETER PLACEMENT MEDICATIONS: None ANESTHESIA/SEDATION: Moderate (conscious) sedation was employed during this procedure as  administered by the Interventional Radiology RN. A total of Versed 1.5 mg and Fentanyl 75 mcg was administered intravenously. Moderate Sedation Time: 57 minutes. The patient's level of consciousness and vital signs were monitored continuously by radiology nursing throughout the procedure under my direct supervision. FLUOROSCOPY TIME: 1 minute, 39 seconds (8 mGy) COMPLICATIONS: None immediate. PROCEDURE: Informed consent was obtained from the patient following an explanation of the procedure, risks, benefits and alternatives. The patient understands, agrees and consents for the procedure. All questions were addressed. A time out was performed prior to the initiation of the procedure. The patient was positioned prone on the fluoroscopy table and the posterior aspect of the right iliac crest was marked fluoroscopically. The operative site was prepped and draped in the usual sterile fashion. Under sterile conditions and local anesthesia, an 11 gauge coaxial bone biopsy needle was advanced into the posterior aspect of the right iliac marrow space under intermittent fluoroscopic guidance. Multiple fluoroscopic images were saved procedural documentation purposes. Initially, a bone marrow aspiration was performed. Next, a bone marrow biopsy was obtained with the 11 gauge outer bone marrow device. The needle was removed and superficial hemostasis was obtained with manual compression. A dressing was applied. The patient tolerated the procedure well without immediate post procedural complication. Attention was now paid towards placement of the port a catheter. The right neck and chest were prepped with chlorhexidine in a sterile fashion, and a sterile drape was applied covering the operative field. Maximum barrier sterile technique with sterile gowns and gloves were used for the procedure. A timeout was performed prior to the initiation of the procedure. Local anesthesia was provided with 1% lidocaine with epinephrine. After  creating a small venotomy incision, a micropuncture kit was utilized to access  the internal jugular vein. Real-time ultrasound guidance was utilized for vascular access including the acquisition of a permanent ultrasound image documenting patency of the accessed vessel. The microwire was utilized to measure appropriate catheter length. A subcutaneous port pocket was then created along the upper chest wall utilizing a combination of sharp and blunt dissection. The pocket was irrigated with sterile saline. A single lumen Ang[employer] power injectable port was chosen for placement. The 8 Fr catheter was tunneled from the port pocket site to the venotomy incision. The port was placed in the pocket. The external catheter was trimmed to appropriate length. At the venotomy, an 8 Fr peel-away sheath was placed over a guidewire under fluoroscopic guidance. The catheter was then placed through the sheath and the sheath was removed. Final catheter positioning was confirmed and documented with a fluoroscopic spot radiograph. The port was accessed with a Huber needle, aspirated and flushed. The venotomy site was closed with an interrupted 4-0 Vicryl suture. The port pocket incision was closed with interrupted 2-0 Vicryl suture. Dermabond and Steri-strips were applied to both incisions. Port a Catheter was left accessed for in hospitalization usage. Dressings were applied. The patient tolerated the procedure well without immediate post procedural complication. IMPRESSION: 1. Successful fluoroscopic guided right iliac bone marrow aspiration and core biopsy. 2. Successful image guided Port a catheter placement. Port a catheter is ready immediate usage. Electronically Signed   By: Simonne Come M.D.   On: 12/27/2022 16:21   MR BRAIN W WO CONTRAST  Result Date: 12/24/2022 CLINICAL DATA:  Non-small cell lung cancer staging EXAM: MRI HEAD WITHOUT AND WITH CONTRAST TECHNIQUE: Multiplanar, multiecho pulse sequences of the brain and  surrounding structures were obtained without and with intravenous contrast. CONTRAST:  7mL GADAVIST GADOBUTROL 1 MMOL/ML IV SOLN COMPARISON:  Head CT from 4 days ago FINDINGS: Brain: No enhancement or swelling to suggest metastatic disease. FLAIR hyperintensity in the cerebral white matter especially affecting the frontal lobes and the left centrum semiovale, without involvement of subcortical U fibers, mass effect, or enhancement. No acute or subacute infarct. No hemorrhage, hydrocephalus, or mass. Vascular: Major flow voids are preserved Skull and upper cervical spine: Normal marrow signal Sinuses/Orbits: Negative IMPRESSION: 1. No enhancement to suggest metastatic disease. 2. 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 Electronically Signed   By: Tiburcio Pea M.D.   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 EXAM: DIAGNOSTIC LUMBAR PUNCTURE UNDER FLUOROSCOPIC GUIDANCE COMPARISON:  06/17/2018 FLUOROSCOPY TIME:  Three mGy PROCEDURE: Informed consent was obtained from the patient prior to the procedure, including potential complications of headache, allergy, and pain. With the patient prone, the lower back was prepped with Betadine. 1% Lidocaine was used for local anesthesia. Lumbar puncture was performed at the L2-L3 level using a 3.5 inch, 20 gauge needle with return of clear CSF with an opening pressure of 21 cm water. A total of 16 ml of CSF were obtained for laboratory studies. The patient tolerated the procedure well and there were no apparent complications. IMPRESSION: Technically successful fluoroscopic guided lumbar puncture at L2-L3. Marliss Coots, MD [facility] Electronically Signed   By: Marliss Coots M.D.   On: 12/22/2022 15:42   DG Chest Port 1 View  Result Date: 12/21/2022 CLINICAL DATA:   Status post bronchoscopy, cough EXAM: PORTABLE CHEST 1 VIEW COMPARISON:  CTA chest 3 days  prior FINDINGS: The heart size is stable. The large left upper mediastinal mass is not significantly changed. The left pleural effusion has increased in size, with worsened aeration of the left lower lobe since the radiograph from 3 days prior. The right lung is clear. There is no right effusion. There is no pneumothorax There is no acute osseous abnormality. IMPRESSION: 1. Unchanged large mediastinal mass. 2. Increased size of the left pleural effusion with worsened aeration of the left lower lobe. Electronically Signed   By: Lesia Hausen M.D.   On: 12/21/2022 16:16   DG C-ARM BRONCHOSCOPY  Result Date: 12/21/2022 C-ARM BRONCHOSCOPY: Fluoroscopy was utilized by the requesting physician.  No radiographic interpretation.   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 RADIATION DOSE REDUCTION: This exam was performed according to the departmental dose-optimization program which includes automated exposure control, adjustment of the mA and/or kV according to patient size and/or use of iterative reconstruction technique. COMPARISON:  None Available. FINDINGS: Brain: No evidence of acute infarction, hemorrhage, hydrocephalus, extra-axial collection or mass lesion/mass effect. Vascular: No hyperdense vessel or unexpected calcification. Skull: Normal. Negative for fracture or focal lesion. Sinuses/Orbits: No middle ear or mastoid effusion. Paranasal sinuses are clear. Orbits are unremarkable. Other: None. IMPRESSION: No acute intracranial abnormality. Electronically Signed   By: Lorenza Cambridge M.D.   On: 12/20/2022 14:31   DG Chest Port 1 View  Result Date: 12/18/2022 CLINICAL  DATA:  Status post thoracentesis. EXAM: PORTABLE CHEST 1 VIEW COMPARISON:  CT earlier 12/18/2022 FINDINGS: Persistent anterior mediastinal mass centered left of midline. Tiny left effusion. No pneumothorax. Mild left lung base opacity. Right lung is grossly clear. Normal cardiac silhouette. No edema. Overlapping cardiac leads. IMPRESSION: Known mediastinal mass. Tiny residual left effusion. No pneumothorax. Electronically Signed   By: Karen Kays M.D.   On: 12/18/2022 15:55   CT Angio Chest PE W and/or Wo Contrast  Result Date: 12/18/2022 CLINICAL DATA:  Pulmonary embolism (PE) suspected, low to intermediate prob, positive D-dimer; Metastatic disease evaluation Pt c/o upper chest pain onset a couple of hours ago with mild sob. Reports just lying in the bed when pain started. Pain radiates to shoulder and back of neck Metastatic disease evaluation EXAM: CT ANGIOGRAPHY CHEST CT ABDOMEN AND PELVIS WITH CONTRAST TECHNIQUE: Multidetector CT imaging of the chest was performed using the standard protocol during bolus administration of intravenous contrast. Multiplanar CT image reconstructions and MIPs were obtained to evaluate the vascular anatomy. Multidetector CT imaging of the abdomen and pelvis was performed using the standard protocol during bolus administration of intravenous contrast. RADIATION DOSE REDUCTION: This exam was performed according to the departmental dose-optimization program which includes automated exposure control, adjustment of the mA and/or kV according to patient size and/or use of iterative reconstruction technique. CONTRAST:  OMNIPAQUE IOHEXOL 350 MG/ML SOLN COMPARISON:  CT chest 12/12/2022 FINDINGS: CTA CHEST FINDINGS Cardiovascular: Satisfactory opacification of the pulmonary arteries to the segmental level. No evidence of pulmonary embolism. There is marked narrowing due to external mass effect of the left upper lobe pulmonary artery. Caliber of the artery down to 4 mm. Normal heart  size. No significant pericardial effusion. The thoracic aorta is normal in caliber. No atherosclerotic plaque of the thoracic aorta. No coronary artery calcifications. Mediastinum/Nodes: Redemonstration of approximately 8 x 7.5  cm anterior mediastinal mass that extends into the left hilar region with lymphadenopathy. Prominent but nonenlarged right hilar lymph nodes measuring up to 0.9 cm (6:69). Enlarged precardiac lymph nodes measuring up to 1 cm (6:13). No invasion of the pericardium from the mass is noted. No enlarged axillary lymph nodes. Thyroid gland, trachea, and esophagus demonstrate no significant findings. Lungs/Pleura: Partial collapse of the left lower lobe. Interval increase in peribronchovascular ground-glass airspace opacities within the left upper lobe. Left upper lobe pulmonary nodule measuring 1.6 x 1.2 cm (6:42). No pulmonary mass. Interval increase in size of a small left pleural effusion with associated vague enhancing nodularity. Mild external mass effect on the left upper and lower bronchials. No pneumothorax. Musculoskeletal: No chest wall abnormality. No suspicious lytic or blastic osseous lesions. No acute displaced fracture. Multilevel degenerative changes of the spine. Review of the MIP images confirms the above findings. CT ABDOMEN and PELVIS FINDINGS Hepatobiliary: Vague hypodensity along the falciform ligament of left hepatic lobe likely focal fatty infiltration. No focal liver abnormality. No gallstones, gallbladder wall thickening, or pericholecystic fluid. No biliary dilatation. Pancreas: No focal lesion. Normal pancreatic contour. No surrounding inflammatory changes. No main pancreatic ductal dilatation. Spleen: Normal in size without focal abnormality. Adrenals/Urinary Tract: No adrenal nodule bilaterally. Mild malrotation of the right kidney. Bilateral kidneys enhance symmetrically. No hydronephrosis. No hydroureter. The urinary bladder is unremarkable. Stomach/Bowel: Stomach is  within normal limits. No evidence of bowel wall thickening or dilatation. Appendix appears normal. Vascular/Lymphatic: No abdominal aorta or iliac aneurysm. Mild atherosclerotic plaque of the aorta and its branches. No abdominal, pelvic, or inguinal lymphadenopathy. Reproductive: Prostate is unremarkable. Other: No intraperitoneal free fluid. No intraperitoneal free gas. No organized fluid collection. Musculoskeletal: Tiny fat containing umbilical hernia. No suspicious lytic or blastic osseous lesions. No acute displaced fracture. L5-S1 intervertebral disc space vacuum phenomenon, posterior disc osteophyte complex formation, endplate sclerosis. Review of the MIP images confirms the above findings. IMPRESSION: 1. No pulmonary embolus. 2. Redemonstration of approximately 8 x 7.5 cm anterior mediastinal mass extending to the left hilar region. Finding concerning for malignancy. Associated marked narrowing due to external mass effect of the left upper lobe pulmonary artery. Caliber of the artery down to 4 mm. 3. Interval increase in peribronchovascular ground-glass airspace opacities within the left upper lobe. Finding may represent infection/inflammation versus atelectasis in the setting of mild external mass effect on the left upper and lower bronchials. 4. Left upper lobe pulmonary nodule measuring 1.6 x 1.2 cm. 5. Interval increase in size of a small left pleural effusion with associated vague enhancing nodularity. 6. No evidence of metastatic disease within the abdomen and pelvis. Electronically Signed   By: Tish Frederickson M.D.   On: 12/18/2022 03:56   CT ABDOMEN PELVIS W CONTRAST  Result Date: 12/18/2022 CLINICAL DATA:  Pulmonary embolism (PE) suspected, low to intermediate prob, positive D-dimer; Metastatic disease evaluation Pt c/o upper chest pain onset a couple of hours ago with mild sob. Reports just lying in the bed when pain started. Pain radiates to shoulder and back of neck Metastatic disease  evaluation EXAM: CT ANGIOGRAPHY CHEST CT ABDOMEN AND PELVIS WITH CONTRAST TECHNIQUE: Multidetector CT imaging of the chest was performed using the standard protocol during bolus administration of intravenous contrast. Multiplanar CT image reconstructions and MIPs were obtained to evaluate the vascular anatomy. Multidetector CT imaging of the abdomen and pelvis was performed using the standard protocol during bolus administration of intravenous contrast. RADIATION DOSE REDUCTION: This exam was performed according to  the departmental dose-optimization program which includes automated exposure control, adjustment of the mA and/or kV according to patient size and/or use of iterative reconstruction technique. CONTRAST:  OMNIPAQUE IOHEXOL 350 MG/ML SOLN COMPARISON:  CT chest 12/12/2022 FINDINGS: CTA CHEST FINDINGS Cardiovascular: Satisfactory opacification of the pulmonary arteries to the segmental level. No evidence of pulmonary embolism. There is marked narrowing due to external mass effect of the left upper lobe pulmonary artery. Caliber of the artery down to 4 mm. Normal heart size. No significant pericardial effusion. The thoracic aorta is normal in caliber. No atherosclerotic plaque of the thoracic aorta. No coronary artery calcifications. Mediastinum/Nodes: Redemonstration of approximately 8 x 7.5 cm anterior mediastinal mass that extends into the left hilar region with lymphadenopathy. Prominent but nonenlarged right hilar lymph nodes measuring up to 0.9 cm (6:69). Enlarged precardiac lymph nodes measuring up to 1 cm (6:13). No invasion of the pericardium from the mass is noted. No enlarged axillary lymph nodes. Thyroid gland, trachea, and esophagus demonstrate no significant findings. Lungs/Pleura: Partial collapse of the left lower lobe. Interval increase in peribronchovascular ground-glass airspace opacities within the left upper lobe. Left upper lobe pulmonary nodule measuring 1.6 x 1.2 cm (6:42). No  pulmonary mass. Interval increase in size of a small left pleural effusion with associated vague enhancing nodularity. Mild external mass effect on the left upper and lower bronchials. No pneumothorax. Musculoskeletal: No chest wall abnormality. No suspicious lytic or blastic osseous lesions. No acute displaced fracture. Multilevel degenerative changes of the spine. Review of the MIP images confirms the above findings. CT ABDOMEN and PELVIS FINDINGS Hepatobiliary: Vague hypodensity along the falciform ligament of left hepatic lobe likely focal fatty infiltration. No focal liver abnormality. No gallstones, gallbladder wall thickening, or pericholecystic fluid. No biliary dilatation. Pancreas: No focal lesion. Normal pancreatic contour. No surrounding inflammatory changes. No main pancreatic ductal dilatation. Spleen: Normal in size without focal abnormality. Adrenals/Urinary Tract: No adrenal nodule bilaterally. Mild malrotation of the right kidney. Bilateral kidneys enhance symmetrically. No hydronephrosis. No hydroureter. The urinary bladder is unremarkable. Stomach/Bowel: Stomach is within normal limits. No evidence of bowel wall thickening or dilatation. Appendix appears normal. Vascular/Lymphatic: No abdominal aorta or iliac aneurysm. Mild atherosclerotic plaque of the aorta and its branches. No abdominal, pelvic, or inguinal lymphadenopathy. Reproductive: Prostate is unremarkable. Other: No intraperitoneal free fluid. No intraperitoneal free gas. No organized fluid collection. Musculoskeletal: Tiny fat containing umbilical hernia. No suspicious lytic or blastic osseous lesions. No acute displaced fracture. L5-S1 intervertebral disc space vacuum phenomenon, posterior disc osteophyte complex formation, endplate sclerosis. Review of the MIP images confirms the above findings. IMPRESSION: 1. No pulmonary embolus. 2. Redemonstration of approximately 8 x 7.5 cm anterior mediastinal mass extending to the left hilar  region. Finding concerning for malignancy. Associated marked narrowing due to external mass effect of the left upper lobe pulmonary artery. Caliber of the artery down to 4 mm. 3. Interval increase in peribronchovascular ground-glass airspace opacities within the left upper lobe. Finding may represent infection/inflammation versus atelectasis in the setting of mild external mass effect on the left upper and lower bronchials. 4. Left upper lobe pulmonary nodule measuring 1.6 x 1.2 cm. 5. Interval increase in size of a small left pleural effusion with associated vague enhancing nodularity. 6. No evidence of metastatic disease within the abdomen and pelvis. Electronically Signed   By: Tish Frederickson M.D.   On: 12/18/2022 03:56   CT Chest W Contrast  Result Date: 12/12/2022 CLINICAL DATA:  Left perihilar mass. EXAM: CT  CHEST WITH CONTRAST TECHNIQUE: Multidetector CT imaging of the chest was performed during intravenous contrast administration. RADIATION DOSE REDUCTION: This exam was performed according to the departmental dose-optimization program which includes automated exposure control, adjustment of the mA and/or kV according to patient size and/or use of iterative reconstruction technique. CONTRAST:  50mL OMNIPAQUE IOHEXOL 350 MG/ML SOLN COMPARISON:  Radiograph of same day. FINDINGS: Cardiovascular: No evidence of thoracic aortic dissection or aneurysm. Mild coronary artery calcifications are noted. Normal heart size. No pericardial effusion. Mediastinum/Nodes: 8.3 x 7.9 cm heterogeneous mass is seen in the anterior mediastinum which extends into the left hilar region and is consistent with malignancy or metastatic disease. Thyroid gland is unremarkable. Possible 18 mm right hilar lymph node. 1 cm right axillary lymph node. Esophagus is unremarkable. Lungs/Pleura: No pneumothorax is noted. Minimal right posterior basilar subsegmental atelectasis is noted. Small left pleural effusion is noted with adjacent  atelectasis of left lower lobe. Mild subsegmental atelectasis is noted in left upper lobe as well secondary to previously described mediastinal mass. Upper Abdomen: No acute abnormality. Musculoskeletal: No chest wall abnormality. No acute or significant osseous findings. IMPRESSION: 8.3 x 7.9 cm anterior mediastinal mass is noted which extends in the left hilar region and is consistent with malignancy or metastatic disease. Tissue sampling is recommended. Possible 18 mm right hilar lymph node. 1 cm right axillary lymph node. Potentially these may represent metastatic disease is well. Small left pleural effusion is noted with adjacent atelectasis of left lower lobe. Mild subsegmental atelectasis is noted in left upper lobe secondary to previously described mediastinal mass. Mild coronary artery calcifications are noted. Electronically Signed   By: Lupita Raider M.D.   On: 12/12/2022 08:27   DG Chest 2 View  Result Date: 12/12/2022 CLINICAL DATA:  Chest pain radiating to the shoulder and neck. EXAM: CHEST - 2 VIEW COMPARISON:  09/04/2022 FINDINGS: Large masslike appearance over the left mediastinum which appears distinct from the aortic knob. There is left lower lobe pulmonary opacification and small pleural effusion. Normal heart size. Artifact from EKG leads. IMPRESSION: Left perihilar mass since radiograph earlier this year with lower lobe opacification and small pleural effusion. Recommend enhanced CT. Electronically Signed   By: Tiburcio Pea M.D.   On: 12/12/2022 05:05    Microbiology: No results found for this or any previous visit (from the past 240 hour(s)).   Labs: Basic Metabolic Panel: Recent Labs  Lab 12/29/22 0600 12/30/22 0525 01/01/23 0625 01/02/23 0500 01/03/23 0459  NA 140 136 135 134* 131*  K 4.5 4.0 4.2 4.3 4.5  CL 106 101 99 98 98  CO2 24 24 26 27 25   GLUCOSE 123* 134* 95 118* 119*  BUN 17 18 22* 17 16  CREATININE 0.97 0.94 0.96 0.88 0.89  CALCIUM 9.5 9.3 9.0 8.8* 8.6*   MG  --   --  2.2 1.9 2.0  PHOS  --   --   --   --  3.1   Liver Function Tests: Recent Labs  Lab 12/28/22 0449 12/29/22 0600 12/30/22 0525 01/01/23 0625 01/02/23 0500 01/03/23 0459  AST 41 34 31 32 29  --   ALT 49* 54* 55* 67* 68*  --   ALKPHOS 91 102 93 95 94  --   BILITOT 0.9 0.9 0.9 1.3* 0.9  --   PROT 8.0 7.7 7.1 7.2 6.8  --   ALBUMIN 4.2 3.8 3.6 3.4* 3.3* 3.1*   No results for input(s): "LIPASE", "AMYLASE" in the last  168 hours. No results for input(s): "AMMONIA" in the last 168 hours. CBC: Recent Labs  Lab 12/27/22 1430 12/28/22 0449 12/29/22 0600 12/30/22 0525 12/31/22 0500 01/01/23 0625 01/02/23 0500 01/03/23 0459  WBC 11.1* 17.5* 29.5* 22.0* 17.6* 12.1* 6.3 4.8  NEUTROABS 8.3* 15.9* 27.3* 21.0* 16.3*  --   --   --   HGB 15.6 16.4 15.9 15.8 15.0 16.0 16.2 16.0  HCT 44.6 46.6 45.4 44.7 42.2 45.0 45.4 44.3  MCV 89.9 90.5 90.4 88.9 88.5 87.9 87.3 88.4  PLT 246 273 270 267 273 286 224 189   Cardiac Enzymes: No results for input(s): "CKTOTAL", "CKMB", "CKMBINDEX", "TROPONINI" in the last 168 hours. BNP: BNP (last 3 results) No results for input(s): "BNP" in the last 8760 hours.  ProBNP (last 3 results) No results for input(s): "PROBNP" in the last 8760 hours.  CBG: No results for input(s): "GLUCAP" in the last 168 hours.     Signed:  Rhetta Mura MD   [facility] 01/03/2023, 8:48 AM

## 2023-01-03 NOTE — Progress Notes (Signed)
Patient was recently admitted to the hospital where he was diagnosed, staged and initiated treatment. He was discharged today. Dr [name] would like to see him in about one week with labs and CXR.   CXR orders placed. Referrals to social work and nutrition per protocol.   Spoke with patient and reviewed appointment time and need for CXR. He also has number to call office if he needs to reach out before that time.   Oncology Nurse Navigator Documentation     01/03/2023   12:30 PM  Oncology Nurse Navigator Flowsheets  Abnormal Finding Date 12/12/2022  Confirmed Diagnosis Date 12/21/2022  Phase of Treatment Chemo  Chemotherapy Actual Start Date: 12/27/2022  Navigator Follow Up Date: 01/10/2023  Navigator Follow Up Reason: New Patient Appointment  Navigator Location [facility]  Navigator Encounter Type Appt/Treatment Plan Review;Introductory Phone Call  Treatment Initiated Date 12/27/2022  Patient Visit Type MedOnc  Treatment Phase Active Tx  Barriers/Navigation Needs Coordination of Care;Education  Education Other  Interventions Coordination of Care;Education  Acuity Level 2-Minimal Needs (1-2 Barriers Identified)  Coordination of Care Appts  Education Method Verbal  Time Spent with Patient 30  Genetic Counseling Type None

## 2023-01-03 NOTE — Plan of Care (Signed)

## 2023-01-03 NOTE — Progress Notes (Signed)
Went over DC paperwork with pt, pt stated he was aware of any changes. Stated he would follow up with PCP and pick up medications.

## 2023-01-04 ENCOUNTER — Inpatient Hospital Stay: Payer: Medicaid Other | Attending: Hematology & Oncology | Admitting: Licensed Clinical Social Worker

## 2023-01-04 DIAGNOSIS — C3492 Malignant neoplasm of unspecified part of left bronchus or lung: Secondary | ICD-10-CM

## 2023-01-04 LAB — CYTOLOGY - NON PAP

## 2023-01-04 NOTE — Progress Notes (Signed)
[facility] Clinical Social Work  Initial Assessment   Christopher Henson is a 42 y.o. year old male contacted by phone. Clinical Social Work was referred by medical provider for assessment of psychosocial needs.   SDOH (Social Determinants of Health) assessments performed: Yes SDOH Interventions    Flowsheet Row Office Visit from 12/10/2018 in [facility]  SDOH Interventions   Depression Interventions/Treatment  Patient refuses Treatment  [offered onsite counseling, given phone number to counseling and [facility]]       SDOH Screenings   Food Insecurity: No Food Insecurity (12/18/2022)  Housing: Low Risk  (12/18/2022)  Transportation Needs: No Transportation Needs (12/18/2022)  Utilities: Not At Risk (12/18/2022)  Depression (PHQ2-9): Low Risk  (06/08/2022)  Tobacco Use: High Risk (12/21/2022)     Distress Screen completed: No     No data to display            Family/Social Information:  Housing Arrangement: patient is unhoused / unstably housed.  Prior to recent hospitalization pt had been staying in a motel.  Pt needs assistance at this time and is unable to stay by himself.  Pt is temporarily staying with his sister, but will not be able to remain there indefinitely as there are already 5 people occupying the home.   Family members/support persons in your life? Pt has limited support.  His sister is his primary source of support at this time. 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  Income source: No income Financial concerns: Yes, current concerns Type of concern: Utilities and Rent/ mortgage Food access concerns: no Religious or spiritual practice: No Services Currently in place:  none  Coping/ Adjustment to diagnosis: Patient understands treatment plan and what happens next? yes Concerns about diagnosis and/or  treatment: Losing my job and/or losing income, Overwhelmed by information, Afraid of cancer, How I will pay for the services I need, How will I care for myself, and Quality of life Patient reported stressors: Finances, Depression, Anxiety/ nervousness, Adjusting to my illness, and Physical issues- Housing Hopes and/or priorities: Pt's priority is to start treatment w/ the hope of positive results Patient enjoys  not discussed Current coping skills/ strengths: Capable of independent living  and Motivation for treatment/growth     SUMMARY: Current SDOH Barriers:  Financial constraints related to unemployment, Limited social support, Housing barriers, and Conservation officer, nature knowledge of community resource:    Clinical Social Work Clinical Goal(s):  Explore community resource options for unmet needs related to:  Scientist, clinical (histocompatibility and immunogenetics) Strain   Interventions: Discussed common feeling and emotions when being diagnosed with cancer, and the importance of support during treatment Informed patient of the support team roles and support services at [facility] Provided CSW contact information and encouraged patient to call with any questions or concerns Covering CSW provided contact  information for [employer] as pt has never registered with them to see if they could assist with housing as well as contact information for [employer] and [employer].  Pt instructed to contact Medicaid CM to apply for SSI.  SSD to be evaluated pending response to treatment.  Contact information for [facility] CSW given to pt as well to follow up upon her return.    Follow Up Plan: Patient will contact CSW with any support or resource needs Patient verbalizes understanding of plan: Yes  Rachel Moulds, LCSW Clinical Social Worker [facility]  Patient is participating in a Managed Medicaid Plan:  Yes

## 2023-01-09 ENCOUNTER — Encounter: Payer: Self-pay | Admitting: Medical

## 2023-01-09 ENCOUNTER — Telehealth: Payer: Self-pay | Admitting: *Deleted

## 2023-01-09 ENCOUNTER — Encounter (HOSPITAL_COMMUNITY): Payer: Self-pay | Admitting: Hematology & Oncology

## 2023-01-09 ENCOUNTER — Ambulatory Visit (INDEPENDENT_AMBULATORY_CARE_PROVIDER_SITE_OTHER): Payer: 59 | Admitting: Medical

## 2023-01-09 VITALS — BP 148/98 | HR 108 | Ht 72.0 in | Wt 170.4 lb

## 2023-01-09 DIAGNOSIS — Z789 Other specified health status: Secondary | ICD-10-CM | POA: Diagnosis not present

## 2023-01-09 DIAGNOSIS — I10 Essential (primary) hypertension: Secondary | ICD-10-CM | POA: Diagnosis not present

## 2023-01-09 DIAGNOSIS — R7301 Impaired fasting glucose: Secondary | ICD-10-CM | POA: Diagnosis not present

## 2023-01-09 DIAGNOSIS — F17200 Nicotine dependence, unspecified, uncomplicated: Secondary | ICD-10-CM | POA: Diagnosis not present

## 2023-01-09 DIAGNOSIS — J910 Malignant pleural effusion: Secondary | ICD-10-CM | POA: Diagnosis not present

## 2023-01-09 DIAGNOSIS — Z9981 Dependence on supplemental oxygen: Secondary | ICD-10-CM | POA: Insufficient documentation

## 2023-01-09 DIAGNOSIS — C3492 Malignant neoplasm of unspecified part of left bronchus or lung: Secondary | ICD-10-CM

## 2023-01-09 DIAGNOSIS — E43 Unspecified severe protein-calorie malnutrition: Secondary | ICD-10-CM | POA: Diagnosis not present

## 2023-01-09 DIAGNOSIS — B20 Human immunodeficiency virus [HIV] disease: Secondary | ICD-10-CM | POA: Diagnosis not present

## 2023-01-09 DIAGNOSIS — J9859 Other diseases of mediastinum, not elsewhere classified: Secondary | ICD-10-CM

## 2023-01-09 LAB — POCT GLYCOSYLATED HEMOGLOBIN (HGB A1C): Hemoglobin A1C: 6.1 % — AB (ref 4.0–5.6)

## 2023-01-09 MED ORDER — AMLODIPINE BESYLATE 5 MG PO TABS: 5.0000 mg | ORAL_TABLET | Freq: Every day | ORAL | 2 refills | Status: DC

## 2023-01-09 NOTE — Progress Notes (Signed)
Subjective:  Christopher Henson is a 42 y.o. male who presents for Chief Complaint  Patient presents with   Consult    Patient states he is here to follow up from hospital, recently diagnosed w/lung cancer. Lung collapsed.      Medical team: Dr. Staci Righter, infectious disease Ronda Fairly, social worker New visit with Dr. [name], oncology tomorrow Upcoming nutritionist appt   Last visit here 2021, lost to follow up, homeless per recent hospitalization notes  Here for hospital follow-up  Hospitalized 12/18/2022 through 01/03/2023  Hospitalized for malignant pleural effusion.  He originally presented to the emergency department on 12/12/2022 with dyspnea and apparently a mediastinal mass was found but he left AMA.  He went back 12/18/2022 hypotensive, with chest pain, dyspnea, weight loss and was admitted at that point.  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  He had multiple thoracentesis due to pleural effusion.  It was felt that the fluid continues to build up he might need a pleural drain.  He was also in respiratory failure on admission.  HIV diagnosed 2020.  Given lab findings in this hospitalization was started on Bactrim for PCP prophylaxis and continued on Biktarvy.  Had been taking Biktarvy prior to hospitalization but had missed some doses every now and then, also had trouble getting medication from  lack of finances.  He also has a history of tobacco abuse, situational depression  He presents today on oxygen therapy as well.  Using 3 L/min.  Just got oxygen tanks, so that is secured currently.  Currently staying with sister for the meantime since leaving hospital, was prior homeless.  Used the bus today, but Child psychotherapist is checking into [place] transportation.  Currently no new symptoms.  Has side pain from the recent thoracentesis  at the hospital..  Asking about pain control, pain medication.  No other aggravating or relieving factors.    No other c/o.  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 20 capsule 0   bictegravir-emtricitabine-tenofovir AF (BIKTARVY) 50-200-25 MG TABS tablet Take 1 tablet by mouth daily. 30 tablet 0   fluconazole (DIFLUCAN) 200 MG tablet Take 1 tablet (200 mg total) by mouth daily for 30 doses. 30 tablet 0   sulfamethoxazole-trimethoprim (BACTRIM DS) 800-160 MG tablet Take 1 tablet by mouth daily. 30 tablet 0   senna-docusate (SENOKOT-S) 8.6-50 MG tablet Take 1 tablet by mouth 2 (two) times daily between meals as needed for mild constipation. (Patient not taking: Reported on 01/09/2023)     No current facility-administered medications on file prior to visit.    The following portions of the patient's history were reviewed and updated as appropriate: allergies, current medications, past family history, past medical history, past social history, past surgical history and problem list.  ROS Otherwise as in subjective above    Objective: BP (!) 148/98   Pulse (!) 108  Ht 6' (1.829 m)   Wt 170 lb 6.4 oz (77.3 kg)   SpO2 98%   BMI 23.11 kg/m   BP Readings from Last 3 Encounters:  01/09/23 (!) 148/98  01/03/23 (!) 129/90  12/12/22 107/73   Wt Readings from Last 3 Encounters:  01/09/23 170 lb 6.4 oz (77.3 kg)  12/18/22 173 lb 14.4 oz (78.9 kg)  12/12/22 182 lb  15.7 oz (83 kg)   General appearance: alert, no distress, well developed, well nourished Neck: supple, no lymphadenopathy, no thyromegaly, no masses Heart: RRR, normal S1, S2, no murmurs Lungs: Decreased lung sounds left lower fields, otherwise clear , no wheezes, rhonchi, or rales Abdomen: +bs, soft, non tender, non distended, no masses, no hepatomegaly, no splenomegaly Pulses: 2+ radial pulses, 2+ pedal pulses, normal cap refill Ext: no edema    Assessment: Encounter Diagnoses  Name Primary?   Symptomatic HIV infection (HCC) Yes   Protein-calorie malnutrition, severe    Small cell lung cancer, left (HCC)    Smoker    Mediastinal mass    Malignant pleural effusion    History of homeless    On home oxygen therapy    IFG (impaired fasting glucose)    Essential hypertension, benign      Plan: I reviewed his recent hospitalization discharge summary notes, labs, scans.  Medicines reconciled.  I have not seen this patient in a couple years  Unfortunately has a new lung cancer diagnosis  He sees oncology tomorrow and has planned labs and chest x-ray tomorrow.  We discussed our role in primary care at this point.  Oncology and infectious disease will be the major players right now with his care.  We discussed pain control-defer to oncology tomorrow.  We can help if needed but this will probably be better managed by oncology  He had prior hypertension the last time I saw him in blood pressure seem to be on the rise again.  We will initiate amlodipine blood pressure medication once daily.  This should be reasonably priced and less problematic compared to other blood pressure medicine choices.  He has had several recent elevated blood sugars.  Hemoglobin A1c 6.1% at risk for diabetes.  Counseled on diet   He has scheduled visit with nutritionist coming up.  Needs to have nutrition counseling on protein calorie malnutrition as well as impaired glucose  He notes that he currently  has oxygen therapy at home.  He has been homeless but recently in the last week or so since coming out of the hospital he is staying with his sister for the time being.  I will reach out to the social worker to see how we might be able to help that he does have a Child psychotherapist helping organize services for him currently  HIV infection-managed by infectious disease, continue Biktarvy, continue Bactrim for prophylaxis  He noted that he quit smoking as of this hospitalization and new diagnosis of lung cancer  Robyn was seen today for consult.  Diagnoses and all orders for this visit:  Symptomatic HIV infection (HCC)  Protein-calorie malnutrition, severe  Small cell lung cancer, left (HCC)  Smoker  Mediastinal mass  Malignant pleural effusion  History of homeless  On home oxygen therapy  IFG (impaired fasting glucose) -     HgB A1c  Essential hypertension, benign  Other orders -     amLODipine (NORVASC) 5 MG tablet; Take 1 tablet (5 mg total) by mouth daily.    Follow up: 1 to  3 months

## 2023-01-09 NOTE — Progress Notes (Signed)
Care Coordination   Note   01/09/2023 Name: Fritzgerald Decandia MRN: [id number] DOB: 1980-06-21  Blakeley Guia is a 42 y.o. year old male who sees Tysinger, Kermit Balo, PA-C for primary care. I reached out to [name] by phone today to offer care coordination services.  Mr. Canedy was given information about Care Coordination services today including:   The Care Coordination services include support from the care team which includes your Nurse Coordinator, Clinical Social Worker, or Pharmacist.  The Care Coordination team is here to help remove barriers to the health concerns and goals most important to you. Care Coordination services are voluntary, and the patient may decline or stop services at any time by request to their care team member.   Care Coordination Consent Status: Patient agreed to services and verbal consent obtained.   Follow up plan:  Telephone appointment with care coordination team member scheduled for:  01/11/23  Encounter Outcome:  Patient Scheduled  [facility] Coordination Care Guide  Direct Dial: [phone]

## 2023-01-09 NOTE — Telephone Encounter (Signed)
I called patient and reviewed his new patient appointment for tomorrow. Instructed him to get a CXR before coming up to see Dr. [name]. He verbalized date and time of appointments. Gave him instructions on how to get to Radiology. He verbalized understanding.

## 2023-01-10 ENCOUNTER — Inpatient Hospital Stay: Payer: Medicaid Other

## 2023-01-10 ENCOUNTER — Encounter: Payer: 59 | Admitting: Dietician

## 2023-01-10 ENCOUNTER — Inpatient Hospital Stay (HOSPITAL_BASED_OUTPATIENT_CLINIC_OR_DEPARTMENT_OTHER): Payer: Medicaid Other | Admitting: Hematology & Oncology

## 2023-01-10 ENCOUNTER — Telehealth: Payer: Self-pay | Admitting: Dietician

## 2023-01-10 ENCOUNTER — Ambulatory Visit (HOSPITAL_BASED_OUTPATIENT_CLINIC_OR_DEPARTMENT_OTHER)
Admission: RE | Admit: 2023-01-10 | Discharge: 2023-01-10 | Disposition: A | Discharge status: Home / Self Care | Payer: 59 | Source: Ambulatory Visit | Attending: Hematology & Oncology | Admitting: Hematology & Oncology

## 2023-01-10 ENCOUNTER — Encounter: Payer: Self-pay | Admitting: Hematology & Oncology

## 2023-01-10 ENCOUNTER — Inpatient Hospital Stay: Payer: 59 | Attending: Hematology & Oncology

## 2023-01-10 VITALS — BP 132/103 | HR 104 | Temp 98.4°F | Resp 20 | Ht 72.0 in | Wt 166.8 lb

## 2023-01-10 DIAGNOSIS — J90 Pleural effusion, not elsewhere classified: Secondary | ICD-10-CM | POA: Diagnosis not present

## 2023-01-10 DIAGNOSIS — Z452 Encounter for adjustment and management of vascular access device: Secondary | ICD-10-CM | POA: Diagnosis not present

## 2023-01-10 DIAGNOSIS — J9859 Other diseases of mediastinum, not elsewhere classified: Secondary | ICD-10-CM | POA: Diagnosis present

## 2023-01-10 DIAGNOSIS — C3492 Malignant neoplasm of unspecified part of left bronchus or lung: Secondary | ICD-10-CM | POA: Diagnosis not present

## 2023-01-10 DIAGNOSIS — Z5189 Encounter for other specified aftercare: Secondary | ICD-10-CM | POA: Insufficient documentation

## 2023-01-10 DIAGNOSIS — E43 Unspecified severe protein-calorie malnutrition: Secondary | ICD-10-CM | POA: Diagnosis present

## 2023-01-10 DIAGNOSIS — C7A1 Malignant poorly differentiated neuroendocrine tumors: Secondary | ICD-10-CM | POA: Insufficient documentation

## 2023-01-10 DIAGNOSIS — Z5112 Encounter for antineoplastic immunotherapy: Secondary | ICD-10-CM | POA: Diagnosis present

## 2023-01-10 DIAGNOSIS — Z21 Asymptomatic human immunodeficiency virus [HIV] infection status: Secondary | ICD-10-CM | POA: Insufficient documentation

## 2023-01-10 DIAGNOSIS — D709 Neutropenia, unspecified: Secondary | ICD-10-CM | POA: Diagnosis not present

## 2023-01-10 DIAGNOSIS — Z5111 Encounter for antineoplastic chemotherapy: Secondary | ICD-10-CM | POA: Diagnosis present

## 2023-01-10 DIAGNOSIS — B20 Human immunodeficiency virus [HIV] disease: Secondary | ICD-10-CM | POA: Insufficient documentation

## 2023-01-10 LAB — CBC WITH DIFFERENTIAL (CANCER CENTER ONLY)
Abs Immature Granulocytes: 0.01 10*3/uL (ref 0.00–0.07)
Basophils Absolute: 0 10*3/uL (ref 0.0–0.1)
Basophils Relative: 2 %
Eosinophils Absolute: 0 10*3/uL (ref 0.0–0.5)
Eosinophils Relative: 1 %
HCT: 39.7 % (ref 39.0–52.0)
Hemoglobin: 14.3 g/dL (ref 13.0–17.0)
Immature Granulocytes: 1 %
Lymphocytes Relative: 58 %
Lymphs Abs: 1.2 10*3/uL (ref 0.7–4.0)
MCH: 31.3 pg (ref 26.0–34.0)
MCHC: 36 g/dL (ref 30.0–36.0)
MCV: 86.9 fL (ref 80.0–100.0)
Monocytes Absolute: 0.4 10*3/uL (ref 0.1–1.0)
Monocytes Relative: 19 %
Neutro Abs: 0.4 10*3/uL — CL (ref 1.7–7.7)
Neutrophils Relative %: 19 %
Platelet Count: 273 10*3/uL (ref 150–400)
RBC: 4.57 MIL/uL (ref 4.22–5.81)
RDW: 12.8 % (ref 11.5–15.5)
Smear Review: NORMAL
WBC Count: 2 10*3/uL — ABNORMAL LOW (ref 4.0–10.5)
nRBC: 0 % (ref 0.0–0.2)

## 2023-01-10 LAB — CMP (CANCER CENTER ONLY)
ALT: 32 U/L (ref 0–44)
AST: 15 U/L (ref 15–41)
Albumin: 3.8 g/dL (ref 3.5–5.0)
Alkaline Phosphatase: 106 U/L (ref 38–126)
Anion gap: 7 (ref 5–15)
BUN: 8 mg/dL (ref 6–20)
CO2: 28 mmol/L (ref 22–32)
Calcium: 9.1 mg/dL (ref 8.9–10.3)
Chloride: 107 mmol/L (ref 98–111)
Creatinine: 0.88 mg/dL (ref 0.61–1.24)
GFR, Estimated: >60 mL/min (ref >60)
Glucose, Bld: 104 mg/dL — ABNORMAL HIGH (ref 70–99)
Potassium: 4 mmol/L (ref 3.5–5.1)
Sodium: 142 mmol/L (ref 135–145)
Total Bilirubin: 0.3 mg/dL (ref 0.3–1.2)
Total Protein: 6.7 g/dL (ref 6.5–8.1)

## 2023-01-10 LAB — LACTATE DEHYDROGENASE: LDH: 675 U/L — ABNORMAL HIGH (ref 98–192)

## 2023-01-10 MED ORDER — HEPARIN SOD (PORK) LOCK FLUSH 100 UNIT/ML IV SOLN
500.0000 [IU] | Freq: Once | INTRAVENOUS | Status: AC
  Administered 2023-01-10: 500 [IU] via INTRAVENOUS

## 2023-01-10 MED ORDER — SODIUM CHLORIDE 0.9% FLUSH
10.0000 mL | Freq: Once | INTRAVENOUS | Status: AC
  Administered 2023-01-10: 10 mL via INTRAVENOUS

## 2023-01-10 MED ORDER — DRONABINOL 5 MG PO CAPS: 5.0000 mg | ORAL_CAPSULE | Freq: Two times a day (BID) | ORAL | 0 refills | Status: DC

## 2023-01-10 MED ORDER — HYDROMORPHONE HCL 4 MG PO TABS: 4.0000 mg | ORAL_TABLET | Freq: Four times a day (QID) | ORAL | 0 refills | Status: DC | PRN

## 2023-01-10 NOTE — Patient Instructions (Signed)

## 2023-01-10 NOTE — Progress Notes (Signed)
Hematology and Oncology Follow Up Visit  Christopher Henson [id number] 09-15-1980 42 y.o. 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  I saw him in consultation at [facility].  He presented with a large mediastinal mass.  He had a large left pleural effusion.  He subsequently had effusion drained.  This appeared to be consistent with small cell lung cancer.  However, immunohistochemical stains seem to suggest a large cell neuroendocrine carcinoma.  I was worried because LDH was still high.  We did do a bone marrow biopsy on him.  This was done on 12/27/2022.  The pathology report [id number]) did not show any evidence of marrow involvement.  He restarted on chemotherapy.  We gave him carboplatinum/etoposide in the hospital.  Unfortunately we cannot give him the immunotherapy.  He tolerated this pretty well.  He was subsequently discharged.  He is on oxygen right now.  We did do a chest x-ray on him.  This did show a small effusion on the left-hand side.  I thought be worthwhile trying to drain this again.  He still have the mediastinal mass.  He is still having chest wall pain.  We will continue him on his hydromorphone.  His appetite is down.  In the hospital we gave him Marinol.  Unfortunately, he was not given this upon discharge.  We will have to reorder this.  He is not smoking.  He has not have any problems with nausea or vomiting right now.  There is no problems with bowels or bladder.  I think in the hospital, he had a little bit of constipation.  Overall, I would have said that his performance status for now is probably ECOG 1-2.  Medications:  Current Outpatient Medications:    bictegravir-emtricitabine-tenofovir AF  (BIKTARVY) 50-200-25 MG TABS tablet, Take 1 tablet by mouth daily., Disp: 30 tablet, Rfl: 0   dronabinol (MARINOL) 5 MG capsule, Take 1 capsule (5 mg total) by mouth 2 (two) times daily before a meal., Disp: 60 capsule, Rfl: 0   fluconazole (DIFLUCAN) 200 MG tablet, Take 1 tablet (200 mg total) by mouth daily for 30 doses., Disp: 30 tablet, Rfl: 0   HYDROmorphone (DILAUDID) 4 MG tablet, Take 1 tablet (4 mg total) by mouth every 6 (six) hours as needed for severe pain., Disp: 90 tablet, Rfl: 0   sulfamethoxazole-trimethoprim (BACTRIM DS) 800-160 MG tablet, Take 1 tablet by mouth daily., Disp: 30 tablet, Rfl: 0   amLODipine (NORVASC) 5 MG tablet, Take 1 tablet (5 mg total) by mouth daily. (Patient not taking: Reported on 01/10/2023), Disp: 30 tablet, Rfl: 2   benzonatate (TESSALON) 100 MG capsule, Take 1 capsule (100 mg total) by mouth 3 (three) times daily as needed for cough. (Patient not taking: Reported on 01/10/2023), Disp: 20 capsule, Rfl: 0   senna-docusate (SENOKOT-S) 8.6-50 MG tablet, Take 1 tablet by mouth 2 (two) times daily between meals as needed for mild constipation. (Patient not taking: Reported on 01/09/2023), Disp: , Rfl:   Allergies: No Known Allergies  Past Medical History, Surgical history, Social history, and Family History were reviewed and updated.  Review of Systems: Review of Systems  Constitutional: Negative.   HENT:  Negative.    Eyes: Negative.   Respiratory:  Positive for cough  and shortness of breath.   Cardiovascular:  Positive for chest pain.  Gastrointestinal: Negative.   Endocrine: Negative.   Genitourinary: Negative.    Musculoskeletal: Negative.   Skin: Negative.   Neurological: Negative.   Hematological: Negative.   Psychiatric/Behavioral: Negative.      Physical Exam:  height is 6' (1.829 m) and weight is 166 lb 12.8 oz (75.7 kg). His oral temperature is 98.4 F (36.9 C). His blood pressure is 132/103 (abnormal) and his pulse is 104 (abnormal). His  respiration is 20 and oxygen saturation is 100%.   Wt Readings from Last 3 Encounters:  01/10/23 166 lb 12.8 oz (75.7 kg)  01/09/23 170 lb 6.4 oz (77.3 kg)  12/18/22 173 lb 14.4 oz (78.9 kg)    Physical Exam Vitals reviewed.  HENT:     Head: Normocephalic and atraumatic.  Eyes:     Pupils: Pupils are equal, round, and reactive to light.  Cardiovascular:     Rate and Rhythm: Normal rate and regular rhythm.     Heart sounds: Normal heart sounds.     Comments: Cardiac exam is tachycardic but regular.  There are no murmurs. Pulmonary:     Effort: Pulmonary effort is normal.     Breath sounds: Normal breath sounds.     Comments: His lungs sound relatively clear bilaterally.  Maybe a little bit decreased and breath sounds are on the left lower lung. Abdominal:     General: Bowel sounds are normal.     Palpations: Abdomen is soft.     Comments: Abdominal exam is soft.  He has good bowel sounds.  There is no fluid wave.  There is no palpable liver or spleen tip.  Musculoskeletal:        General: No tenderness or deformity. Normal range of motion.     Cervical back: Normal range of motion.  Lymphadenopathy:     Cervical: No cervical adenopathy.  Skin:    General: Skin is warm and dry.     Findings: No erythema or rash.  Neurological:     Mental Status: He is alert and oriented to person, place, and time.  Psychiatric:        Behavior: Behavior normal.        Thought Content: Thought content normal.        Judgment: Judgment normal.      Lab Results  Component Value Date   WBC 2.0 (L) 01/10/2023   HGB 14.3 01/10/2023   HCT 39.7 01/10/2023   MCV 86.9 01/10/2023   PLT 273 01/10/2023     Chemistry      Component Value Date/Time   NA 142 01/10/2023 1110   NA 143 02/25/2019 1041   K 4.0 01/10/2023 1110   CL 107 01/10/2023 1110   CO2 28 01/10/2023 1110   BUN 8 01/10/2023 1110   BUN 8 02/25/2019 1041   CREATININE 0.88 01/10/2023 1110   CREATININE 0.89 12/14/2022 1132       Component Value Date/Time   CALCIUM 9.1 01/10/2023 1110   ALKPHOS 106 01/10/2023 1110   AST 15 01/10/2023 1110   ALT 32 01/10/2023 1110   BILITOT 0.3 01/10/2023 1110       Impression and Plan: Christopher Henson is a very nice 42 year old African-American male.  He has a history of HIV.  This appears to be under very good control.  I know that Infectious Disease saw him in the hospital and are trying to help manage any complications from the  HIV that may occur with his treatment.  I really am not too happy that the fact that this pleural effusion is trying to come back.  I would think that after 1 cycle of chemotherapy, that it would certainly resolve.  I forgot to mention that we are sending his specimen off for molecular analysis.  He certainly is neutropenic.  However, his monocytes are on the higher side.  Hopefully this means his white cell count started to come up.  I think we will hold on a Neupogen shot for right now.  He will start second cycle of treatment next week.  We will have him come in.  After the second cycle, we will then repeat his CT scan.  We will also be able to add the immunotherapy now that he is outpatient.  I would like to go ahead and do another thoracentesis on him.  Again I wanted to try to improve his breathing capacity so that he will not have to use oxygen as much.  Hopefully, the Marinol will get his appetite going again.     Christopher Macho, MD 10/2/20245:16 PM

## 2023-01-10 NOTE — Progress Notes (Signed)
BP remains elevated, patient shares that PCP wanted Dr. Gustavo Lah input before he starts Norvasc.  Patient does not have a way to monitor BP at home. Dr. [name] notified of BP.

## 2023-01-10 NOTE — Progress Notes (Signed)
Our social worker will reach out to patient.

## 2023-01-10 NOTE — Telephone Encounter (Signed)
Attempted to reach patient for a scheduled remote nutrition consult. Provided my cell# on voice mail to return call for his nutrition consult.  Gennaro Africa, RDN, LDN Registered Dietitian, [facility] Part Time Remote (Usual office hours: Tuesday-Thursday) Cell: [phone]

## 2023-01-11 ENCOUNTER — Inpatient Hospital Stay: Payer: Medicaid Other | Admitting: Dietician

## 2023-01-11 ENCOUNTER — Telehealth: Payer: Self-pay

## 2023-01-11 ENCOUNTER — Other Ambulatory Visit: Payer: Self-pay

## 2023-01-11 ENCOUNTER — Ambulatory Visit: Payer: Self-pay | Admitting: Licensed Clinical Social Worker

## 2023-01-11 DIAGNOSIS — C3492 Malignant neoplasm of unspecified part of left bronchus or lung: Secondary | ICD-10-CM

## 2023-01-11 NOTE — Telephone Encounter (Signed)
Notified patient of prior authorization approval for Hydromorphone 4mg  Tablets. Medication is approved through 07/12/2023. No other needs or concerns voiced at this time.

## 2023-01-11 NOTE — Progress Notes (Signed)
Nutrition Follow Up: Reached out to patient at home/cell telephone number.    Patient recently evaluated by IP RD in hospital after MST screen for weight loss.  Recently diagnose with extensive stage small cell lung cancer.  He has a PMHx that includes GERD, HTN, Pleural effusion, poor dentition, protein calorie malnutrition, impaired fasting glucose, alcohol and tobacco use, and HIV+.  He is being treated with carboplatinum/etoposide/ Tecentriq and followed by Dr. [name].  Patient denies any NIS at this time other than some abdominal pain which is r/t eating.  He is only eating 2 meals a day mainly due to financial constraints.  Meals mostly chicken and rice sometimes some vegetable when available. He has not food allergies but endorses lactose intolerance and he misses eating cereal with milk.  Use to eat 3 meals a day egg, bacon, toast, sandwiches, loved to eat baked fish, tried to eat healthy. Fluids: bottled water (4-5/day) tea (4-5 bottles/day)  Nutrition Focused Physical Exam: unable to perform NFPE   Medications: marinol ordered waiting for approval   Labs: reviewed 01/10/23   Anthropometrics: weight loss 16# (8.7% past month which is significant for timeframe  Height: 72" Weight:  01/10/23  166.7# 12/12/22  182.9# UBW: 175-180# BMI: 22.62   Estimated Energy Needs  Kcals: 2250-2625 Protein: 90-113g Fluid: 2.5L   NUTRITION DIAGNOSIS: Inadequate intake r/t availability of food and food insecurity     INTERVENTION:   Educated on importance of adequate calorie and protein energy intake  with nutrient dense foods when possible to maintain weight/strength Discussed best budget friendly options ways to add calories/protein to foods. Encouraged follow up with LCSW for more resources. Discussed strategies for getting most nutrition for $, shopping and prep strategies and health benefits of canned foods and legumes as affordable protein sources. Provided contact information    MONITORING, EVALUATION, GOAL: weight trends, nutrition impact symptoms, PO intake, labs   Next Visit: Remote 2 weeks  Gennaro Africa, RDN, LDN Registered Dietitian, [facility] Part Time Remote (Usual office hours: Tuesday-Thursday) Mobile: [phone] Remote Office: [phone]

## 2023-01-12 ENCOUNTER — Inpatient Hospital Stay: Payer: Medicaid Other

## 2023-01-12 ENCOUNTER — Telehealth: Payer: Self-pay | Admitting: *Deleted

## 2023-01-12 NOTE — Patient Instructions (Signed)
Visit Information  Thank you for taking time to visit with me today. Please don't hesitate to contact me if I can be of assistance to you.   Following are the goals we discussed today:   Goals Addressed             This Visit's Progress    Obtain Supportive Resources   On track    Activities and task to complete in order to accomplish goals.   Keep all upcoming appointments discussed today Continue with compliance of taking medication prescribed by Doctor Implement healthy coping skills discussed to assist with management of symptoms Follow up with Department of Social Services          Our next appointment is by telephone on 10/17 at 1:30 PM  Please call the care guide team at [phone] if you need to cancel or reschedule your appointment.   If you are experiencing a Mental Health or Behavioral Health Crisis or need someone to talk to, please call the Suicide and Crisis Lifeline: 988 call 911   Patient verbalizes understanding of instructions and care plan provided today and agrees to view in MyChart. Active MyChart status and patient understanding of how to access instructions and care plan via MyChart confirmed with patient.     Jenel Lucks, MSW, LCSW [facility] Care Management   [facility] [email or web address] Phone [phone] 8:15 AM

## 2023-01-12 NOTE — Telephone Encounter (Signed)
Telephone encounter was:  Successful.  01/12/2023 Name: Christopher Henson MRN: [id number] DOB: 1980-11-07  Christopher Henson is a 42 y.o. year old male who is a primary care patient of Genia Del . The community resource team was consulted for assistance with Transportation Needs   Care guide performed the following interventions: Patient provided with information about care guide support team and interviewed to confirm resource needs. Ordered Moms meals from [facility] patient has Medicaid and has transportation as well moms meals after hospital stay , proviede additional information regarding SSDI and also about using services as needed  Follow Up Plan:  No further follow up planned at this time. The patient has been provided with needed resources. Marland Kitchen

## 2023-01-12 NOTE — Progress Notes (Signed)
[facility] CSW Progress Note  Clinical Social Worker returned call from patient who inquired about financial assistance.  He is currently staying with his sister, but will be homeless at the end of the week.  Discussed the [employer] and made a referral to [employer] and [employer].  Patient stated he has a phone interview with SSD on 10/23.  He was also able to obtain a bag of food from the food pantry at the [facility] this week.  Patient expressed no other needs at this time.   Dorothey Baseman, LCSW Clinical Social Worker [facility]

## 2023-01-12 NOTE — Patient Outreach (Signed)
Care Coordination   Initial Visit Note   01/11/2023 Name: Edwyn Yeates MRN: [id number] DOB: 1980-05-26  Zoey Repinski is a 42 y.o. year old male who sees Tysinger, Kermit Balo, PA-C for primary care. I spoke with  [name] by phone today.  What matters to the patients health and wellness today?  Supportive Resources    Goals Addressed             This Visit's Progress    Obtain Supportive Resources   On track    Activities and task to complete in order to accomplish goals.   Keep all upcoming appointments discussed today Continue with compliance of taking medication prescribed by Doctor Implement healthy coping skills discussed to assist with management of symptoms Follow up with Department of Social Services          SDOH assessments and interventions completed:  No     Care Coordination Interventions:  Yes, provided  Interventions Today    Flowsheet Row Most Recent Value  Chronic Disease   Chronic disease during today's visit Hypertension (HTN), Other  [Lung cancer and Depression]  General Interventions   General Interventions Discussed/Reviewed General Interventions Discussed, Programmer, applications, Doctor Visits  Doctor Visits Discussed/Reviewed Doctor Visits Discussed  Mental Health Interventions   Mental Health Discussed/Reviewed Mental Health Discussed, Coping Strategies, Depression  Nutrition Interventions   Nutrition Discussed/Reviewed Nutrition Discussed  Pharmacy Interventions   Pharmacy Dicussed/Reviewed Pharmacy Topics Discussed, Medication Adherence  Safety Interventions   Safety Discussed/Reviewed Safety Discussed       Follow up plan: Follow up call scheduled for 2-4 weeks    Encounter Outcome:  Patient Visit Completed   Jenel Lucks, MSW, LCSW [facility] Care Management [facility]  [facility] [email or web address] Phone [phone] 8:15 AM

## 2023-01-13 ENCOUNTER — Encounter (HOSPITAL_COMMUNITY): Payer: Self-pay | Admitting: Emergency Medicine

## 2023-01-13 ENCOUNTER — Encounter (HOSPITAL_COMMUNITY): Payer: Self-pay | Admitting: Hematology & Oncology

## 2023-01-13 ENCOUNTER — Other Ambulatory Visit: Payer: Self-pay

## 2023-01-13 ENCOUNTER — Inpatient Hospital Stay (HOSPITAL_COMMUNITY)
Admission: EM | Admit: 2023-01-13 | Discharge: 2023-01-17 | DRG: 844 | Disposition: A | Discharge status: Home / Self Care | Payer: 59 | Attending: Internal Medicine | Admitting: Internal Medicine

## 2023-01-13 ENCOUNTER — Emergency Department (HOSPITAL_COMMUNITY): Payer: 59

## 2023-01-13 DIAGNOSIS — Z833 Family history of diabetes mellitus: Secondary | ICD-10-CM

## 2023-01-13 DIAGNOSIS — C3492 Malignant neoplasm of unspecified part of left bronchus or lung: Secondary | ICD-10-CM | POA: Diagnosis present

## 2023-01-13 DIAGNOSIS — Z21 Asymptomatic human immunodeficiency virus [HIV] infection status: Secondary | ICD-10-CM | POA: Diagnosis present

## 2023-01-13 DIAGNOSIS — Z8249 Family history of ischemic heart disease and other diseases of the circulatory system: Secondary | ICD-10-CM

## 2023-01-13 DIAGNOSIS — R079 Chest pain, unspecified: Secondary | ICD-10-CM | POA: Diagnosis not present

## 2023-01-13 DIAGNOSIS — Z5112 Encounter for antineoplastic immunotherapy: Secondary | ICD-10-CM | POA: Diagnosis present

## 2023-01-13 DIAGNOSIS — N179 Acute kidney failure, unspecified: Secondary | ICD-10-CM | POA: Diagnosis present

## 2023-01-13 DIAGNOSIS — R918 Other nonspecific abnormal finding of lung field: Secondary | ICD-10-CM | POA: Diagnosis not present

## 2023-01-13 DIAGNOSIS — C7A1 Malignant poorly differentiated neuroendocrine tumors: Secondary | ICD-10-CM | POA: Diagnosis present

## 2023-01-13 DIAGNOSIS — D709 Neutropenia, unspecified: Secondary | ICD-10-CM | POA: Diagnosis not present

## 2023-01-13 DIAGNOSIS — I10 Essential (primary) hypertension: Secondary | ICD-10-CM | POA: Diagnosis present

## 2023-01-13 DIAGNOSIS — J9611 Chronic respiratory failure with hypoxia: Secondary | ICD-10-CM | POA: Diagnosis present

## 2023-01-13 DIAGNOSIS — B20 Human immunodeficiency virus [HIV] disease: Secondary | ICD-10-CM | POA: Diagnosis present

## 2023-01-13 DIAGNOSIS — Z79899 Other long term (current) drug therapy: Secondary | ICD-10-CM

## 2023-01-13 DIAGNOSIS — Z5189 Encounter for other specified aftercare: Secondary | ICD-10-CM | POA: Diagnosis not present

## 2023-01-13 DIAGNOSIS — J910 Malignant pleural effusion: Secondary | ICD-10-CM | POA: Diagnosis present

## 2023-01-13 DIAGNOSIS — J9859 Other diseases of mediastinum, not elsewhere classified: Secondary | ICD-10-CM

## 2023-01-13 DIAGNOSIS — F1021 Alcohol dependence, in remission: Secondary | ICD-10-CM | POA: Diagnosis present

## 2023-01-13 DIAGNOSIS — R0789 Other chest pain: Secondary | ICD-10-CM | POA: Diagnosis not present

## 2023-01-13 DIAGNOSIS — F1020 Alcohol dependence, uncomplicated: Secondary | ICD-10-CM | POA: Diagnosis present

## 2023-01-13 DIAGNOSIS — Z5111 Encounter for antineoplastic chemotherapy: Secondary | ICD-10-CM | POA: Diagnosis present

## 2023-01-13 DIAGNOSIS — Z452 Encounter for adjustment and management of vascular access device: Secondary | ICD-10-CM | POA: Diagnosis not present

## 2023-01-13 DIAGNOSIS — R0602 Shortness of breath: Secondary | ICD-10-CM | POA: Diagnosis not present

## 2023-01-13 DIAGNOSIS — Z87891 Personal history of nicotine dependence: Secondary | ICD-10-CM

## 2023-01-13 DIAGNOSIS — J90 Pleural effusion, not elsewhere classified: Secondary | ICD-10-CM | POA: Diagnosis present

## 2023-01-13 DIAGNOSIS — K219 Gastro-esophageal reflux disease without esophagitis: Secondary | ICD-10-CM | POA: Diagnosis present

## 2023-01-13 LAB — CBC
HCT: 41.6 % (ref 39.0–52.0)
Hemoglobin: 14.6 g/dL (ref 13.0–17.0)
MCH: 31.6 pg (ref 26.0–34.0)
MCHC: 35.1 g/dL (ref 30.0–36.0)
MCV: 90 fL (ref 80.0–100.0)
Platelets: 315 10*3/uL (ref 150–400)
RBC: 4.62 MIL/uL (ref 4.22–5.81)
RDW: 13.2 % (ref 11.5–15.5)
WBC: 3.8 10*3/uL — ABNORMAL LOW (ref 4.0–10.5)
nRBC: 0 % (ref 0.0–0.2)

## 2023-01-13 NOTE — ED Provider Notes (Signed)
[place] [facility] AT [facility] Provider Note   CSN: [id number] Arrival date & time: 01/13/23  2141     History {Add pertinent medical, surgical, social history, OB history to HPI:1} Chief Complaint  Patient presents with   Chest Pain    Christopher Henson is a 42 y.o. male.  42 y.o. 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 Describes a sharp pain localized to the midsternal area that radiates to his left shoulder. Endorses associated shortness of breath and diaphoresis. Does not report any aggravating or alleviating factors. He says his symptoms are similar to when he was initially diagnosed with cancer 1 month ago. At that time he reports "fluid" on the lungs and had to have thoracentesis. He feels similar tonight. Denies fever. Denies swelling or pain in lower extremities. Last seen by his oncologist on 10/2.    Chest Pain      Home Medications Prior to Admission medications   Medication Sig Start Date End Date Taking? Authorizing Provider  amLODipine (NORVASC) 5 MG tablet Take 1 tablet (5 mg total) by mouth daily. Patient not taking: Reported on 01/10/2023 01/09/23   Tysinger, Kermit Balo, PA-C  benzonatate (TESSALON) 100 MG capsule Take 1 capsule (100 mg total) by mouth 3 (three) times daily as needed for cough. Patient not taking: Reported on 01/10/2023 01/03/23   Rhetta Mura, MD  bictegravir-emtricitabine-tenofovir AF (BIKTARVY) 50-200-25 MG TABS tablet Take 1 tablet by mouth daily. 01/02/23   Randall Hiss, MD  dronabinol (MARINOL) 5 MG capsule Take 1 capsule (5 mg total) by mouth 2 (two) times daily before a meal. 01/10/23   Ennever, Rose Phi, MD  fluconazole (DIFLUCAN) 200 MG tablet Take 1 tablet (200 mg total) by mouth daily for 30 doses. 01/02/23 02/01/23  Randall Hiss, MD   HYDROmorphone (DILAUDID) 4 MG tablet Take 1 tablet (4 mg total) by mouth every 6 (six) hours as needed for severe pain. 01/10/23   Josph Macho, MD  senna-docusate (SENOKOT-S) 8.6-50 MG tablet Take 1 tablet by mouth 2 (two) times daily between meals as needed for mild constipation. Patient not taking: Reported on 01/09/2023 01/03/23   Rhetta Mura, MD  sulfamethoxazole-trimethoprim (BACTRIM DS) 800-160 MG tablet Take 1 tablet by mouth daily. 01/02/23 02/01/23  Randall Hiss, MD      Allergies    Patient has no known allergies.    Review of Systems   Review of Systems  Cardiovascular:  Positive for chest pain.    Physical Exam Updated Vital Signs BP 102/69 (BP Location: Left Arm)   Pulse (!) 103   Temp 98.1 F (36.7 C) (Oral)   Resp 13   Ht 6' (1.829 m)   Wt 76.2 kg   SpO2 98%   BMI 22.78 kg/m  Physical Exam Vitals and nursing note reviewed.  Constitutional:      General: He is not in acute distress.    Appearance: He is well-developed.  HENT:     Head: Normocephalic and atraumatic.  Eyes:     Conjunctiva/sclera: Conjunctivae normal.  Cardiovascular:     Rate and Rhythm: Normal rate and regular rhythm.  Pulmonary:     Effort: Pulmonary effort is normal. No respiratory distress.     Breath sounds: Examination of the right-lower field reveals decreased breath sounds.  Examination of the left-lower field reveals decreased breath sounds. Decreased breath sounds present.  Chest:     Chest wall: No tenderness.  Abdominal:     Palpations: Abdomen is soft.     Tenderness: There is no abdominal tenderness.  Musculoskeletal:        General: No swelling.     Cervical back: Neck supple.     Right lower leg: No edema.     Left lower leg: No edema.  Skin:    General: Skin is warm and dry.     Capillary Refill: Capillary refill takes less than 2 seconds.  Neurological:     Mental Status: He is alert.  Psychiatric:        Mood and Affect: Mood normal.     ED  Results / Procedures / Treatments   Labs (all labs ordered are listed, but only abnormal results are displayed) Labs Reviewed  BASIC METABOLIC PANEL  CBC  TROPONIN I (HIGH SENSITIVITY)    EKG None  Radiology No results found.  Procedures Procedures  {Document cardiac monitor, telemetry assessment procedure when appropriate:1}  Medications Ordered in ED Medications - No data to display  ED Course/ Medical Decision Making/ A&P   {   Click here for ABCD2, HEART and other calculatorsREFRESH Note before signing :1}                              Medical Decision Making Amount and/or Complexity of Data Reviewed Labs: ordered. Radiology: ordered.   This patient presents to the ED for concern of chest pain with shortness of breath, this involves an extensive number of treatment options, and is a complaint that carries with it a high risk of complications and morbidity.  The differential diagnosis includes ***   Co morbidities that complicate the patient evaluation  ***   Additional history obtained:  Additional history obtained from *** External records from outside source obtained and reviewed including ***   Lab Tests:  I Ordered, and personally interpreted labs.  The pertinent results include:  ***   Imaging Studies ordered:  I ordered imaging studies including ***  I independently visualized and interpreted imaging which showed *** I agree with the radiologist interpretation   Cardiac Monitoring: / EKG:  The patient was maintained on a cardiac monitor.  I personally viewed and interpreted the cardiac monitored which showed an underlying rhythm of: ***   Consultations Obtained:  I requested consultation with the ***,  and discussed lab and imaging findings as well as pertinent plan - they recommend: ***   Problem List / ED Course / Critical interventions / Medication management  *** I ordered medication including ***  for ***  Reevaluation of the  patient after these medicines showed that the patient {resolved/improved/worsened:23923::"improved"} I have reviewed the patients home medicines and have made adjustments as needed   Social Determinants of Health:  ***   Test / Admission - Considered:  ***   {Document critical care time when appropriate:1} {Document review of labs and clinical decision tools ie heart score, Chads2Vasc2 etc:1}  {Document your independent review of radiology images, and any outside records:1} {Document your discussion with family members, caretakers, and with consultants:1} {Document social determinants of health affecting pt's care:1} {Document your decision making why or why not admission, treatments were needed:1} Final Clinical Impression(s) / ED Diagnoses Final diagnoses:  None    Rx / DC Orders ED Discharge Orders  None

## 2023-01-13 NOTE — ED Triage Notes (Signed)
Pt c/o chest pain and SHOB x a few hours. Pt wears 2lpm [place], does chemo for small cell lung cancer and mediastinal mass.

## 2023-01-14 ENCOUNTER — Encounter (HOSPITAL_COMMUNITY): Payer: Self-pay | Admitting: Family Medicine

## 2023-01-14 ENCOUNTER — Emergency Department (HOSPITAL_COMMUNITY): Payer: 59

## 2023-01-14 DIAGNOSIS — J910 Malignant pleural effusion: Secondary | ICD-10-CM | POA: Diagnosis not present

## 2023-01-14 DIAGNOSIS — J9859 Other diseases of mediastinum, not elsewhere classified: Secondary | ICD-10-CM

## 2023-01-14 DIAGNOSIS — N179 Acute kidney failure, unspecified: Secondary | ICD-10-CM | POA: Diagnosis not present

## 2023-01-14 DIAGNOSIS — C7A8 Other malignant neuroendocrine tumors: Secondary | ICD-10-CM | POA: Diagnosis not present

## 2023-01-14 DIAGNOSIS — R079 Chest pain, unspecified: Secondary | ICD-10-CM

## 2023-01-14 DIAGNOSIS — Z21 Asymptomatic human immunodeficiency virus [HIV] infection status: Secondary | ICD-10-CM

## 2023-01-14 DIAGNOSIS — C3492 Malignant neoplasm of unspecified part of left bronchus or lung: Secondary | ICD-10-CM | POA: Diagnosis not present

## 2023-01-14 DIAGNOSIS — J90 Pleural effusion, not elsewhere classified: Secondary | ICD-10-CM | POA: Diagnosis not present

## 2023-01-14 DIAGNOSIS — C3490 Malignant neoplasm of unspecified part of unspecified bronchus or lung: Secondary | ICD-10-CM | POA: Diagnosis not present

## 2023-01-14 DIAGNOSIS — J9811 Atelectasis: Secondary | ICD-10-CM | POA: Diagnosis not present

## 2023-01-14 LAB — BASIC METABOLIC PANEL WITH GFR
Anion gap: 12 (ref 5–15)
BUN: 9 mg/dL (ref 6–20)
CO2: 22 mmol/L (ref 22–32)
Calcium: 8.5 mg/dL — ABNORMAL LOW (ref 8.9–10.3)
Chloride: 106 mmol/L (ref 98–111)
Creatinine, Ser: 1.36 mg/dL — ABNORMAL HIGH (ref 0.61–1.24)
GFR, Estimated: >60 mL/min
Glucose, Bld: 129 mg/dL — ABNORMAL HIGH (ref 70–99)
Potassium: 3.8 mmol/L (ref 3.5–5.1)
Sodium: 140 mmol/L (ref 135–145)

## 2023-01-14 LAB — TROPONIN I (HIGH SENSITIVITY)
Troponin I (High Sensitivity): 6 ng/L (ref <18)
Troponin I (High Sensitivity): 6 ng/L (ref <18)

## 2023-01-14 LAB — CBC
HCT: 38.8 % — ABNORMAL LOW (ref 39.0–52.0)
Hemoglobin: 13.3 g/dL (ref 13.0–17.0)
MCH: 31.4 pg (ref 26.0–34.0)
MCHC: 34.3 g/dL (ref 30.0–36.0)
MCV: 91.7 fL (ref 80.0–100.0)
Platelets: 297 10*3/uL (ref 150–400)
RBC: 4.23 MIL/uL (ref 4.22–5.81)
RDW: 13.5 % (ref 11.5–15.5)
WBC: 3.8 10*3/uL — ABNORMAL LOW (ref 4.0–10.5)
nRBC: 0 % (ref 0.0–0.2)

## 2023-01-14 LAB — BASIC METABOLIC PANEL
Anion gap: 9 (ref 5–15)
BUN: 7 mg/dL (ref 6–20)
CO2: 21 mmol/L — ABNORMAL LOW (ref 22–32)
Calcium: 6.9 mg/dL — ABNORMAL LOW (ref 8.9–10.3)
Chloride: 114 mmol/L — ABNORMAL HIGH (ref 98–111)
Creatinine, Ser: 0.78 mg/dL (ref 0.61–1.24)
GFR, Estimated: >60 mL/min (ref >60)
Glucose, Bld: 87 mg/dL (ref 70–99)
Potassium: 3.1 mmol/L — ABNORMAL LOW (ref 3.5–5.1)
Sodium: 144 mmol/L (ref 135–145)

## 2023-01-14 MED ORDER — ENOXAPARIN SODIUM 40 MG/0.4ML IJ SOSY
40.0000 mg | PREFILLED_SYRINGE | INTRAMUSCULAR | Status: DC
  Administered 2023-01-16 – 2023-01-17 (×2): 40 mg via SUBCUTANEOUS
  Filled 2023-01-14 (×2): qty 0.4

## 2023-01-14 MED ORDER — SENNOSIDES-DOCUSATE SODIUM 8.6-50 MG PO TABS: 1.0000 | ORAL_TABLET | Freq: Every evening | ORAL | Status: DC | PRN

## 2023-01-14 MED ORDER — FENTANYL 25 MCG/HR TD PT72
1.0000 | MEDICATED_PATCH | TRANSDERMAL | Status: DC
  Administered 2023-01-14: 1 via TRANSDERMAL
  Filled 2023-01-14: qty 1

## 2023-01-14 MED ORDER — BISACODYL 5 MG PO TBEC: 5.0000 mg | DELAYED_RELEASE_TABLET | Freq: Every day | ORAL | Status: DC | PRN

## 2023-01-14 MED ORDER — IOHEXOL 350 MG/ML SOLN
75.0000 mL | Freq: Once | INTRAVENOUS | Status: AC | PRN
  Administered 2023-01-14: 75 mL via INTRAVENOUS

## 2023-01-14 MED ORDER — SULFAMETHOXAZOLE-TRIMETHOPRIM 800-160 MG PO TABS
1.0000 | ORAL_TABLET | Freq: Every day | ORAL | Status: DC
  Administered 2023-01-14 – 2023-01-17 (×4): 1 via ORAL
  Filled 2023-01-14 (×4): qty 1

## 2023-01-14 MED ORDER — SODIUM CHLORIDE 0.9% FLUSH
3.0000 mL | Freq: Two times a day (BID) | INTRAVENOUS | Status: DC
  Administered 2023-01-14 – 2023-01-16 (×6): 3 mL via INTRAVENOUS

## 2023-01-14 MED ORDER — ACETAMINOPHEN 650 MG RE SUPP: 650.0000 mg | Freq: Four times a day (QID) | RECTAL | Status: DC | PRN

## 2023-01-14 MED ORDER — GUAIFENESIN 100 MG/5ML PO LIQD: 5.0000 mL | ORAL | Status: DC | PRN

## 2023-01-14 MED ORDER — FLUCONAZOLE 100 MG PO TABS
200.0000 mg | ORAL_TABLET | Freq: Every day | ORAL | Status: DC
  Administered 2023-01-14 – 2023-01-17 (×4): 200 mg via ORAL
  Filled 2023-01-14: qty 2
  Filled 2023-01-14: qty 1
  Filled 2023-01-14 (×3): qty 2

## 2023-01-14 MED ORDER — SODIUM CHLORIDE 0.9 % IV BOLUS
1000.0000 mL | Freq: Once | INTRAVENOUS | Status: AC
  Administered 2023-01-14: 1000 mL via INTRAVENOUS

## 2023-01-14 MED ORDER — BICTEGRAVIR-EMTRICITAB-TENOFOV 50-200-25 MG PO TABS
1.0000 | ORAL_TABLET | Freq: Every day | ORAL | Status: DC
  Administered 2023-01-14 – 2023-01-17 (×4): 1 via ORAL
  Filled 2023-01-14 (×6): qty 1

## 2023-01-14 MED ORDER — MORPHINE SULFATE (PF) 4 MG/ML IV SOLN
4.0000 mg | Freq: Once | INTRAVENOUS | Status: AC
  Administered 2023-01-14: 4 mg via INTRAVENOUS
  Filled 2023-01-14: qty 1

## 2023-01-14 MED ORDER — ACETAMINOPHEN 325 MG PO TABS
650.0000 mg | ORAL_TABLET | Freq: Four times a day (QID) | ORAL | Status: DC | PRN
  Administered 2023-01-14 – 2023-01-16 (×2): 650 mg via ORAL
  Filled 2023-01-14: qty 2

## 2023-01-14 MED ORDER — ONDANSETRON HCL 4 MG/2ML IJ SOLN: 4.0000 mg | Freq: Four times a day (QID) | INTRAMUSCULAR | Status: DC | PRN

## 2023-01-14 MED ORDER — OXYCODONE HCL 5 MG PO TABS
5.0000 mg | ORAL_TABLET | ORAL | Status: DC | PRN
  Administered 2023-01-14 – 2023-01-16 (×6): 5 mg via ORAL
  Filled 2023-01-14 (×6): qty 1

## 2023-01-14 MED ORDER — POTASSIUM CHLORIDE CRYS ER 20 MEQ PO TBCR
40.0000 meq | EXTENDED_RELEASE_TABLET | ORAL | Status: AC
  Administered 2023-01-14 (×2): 40 meq via ORAL
  Filled 2023-01-14 (×2): qty 2

## 2023-01-14 MED ORDER — ENOXAPARIN SODIUM 40 MG/0.4ML IJ SOSY
40.0000 mg | PREFILLED_SYRINGE | INTRAMUSCULAR | Status: DC
  Administered 2023-01-14: 40 mg via SUBCUTANEOUS
  Filled 2023-01-14: qty 0.4

## 2023-01-14 MED ORDER — HYDROMORPHONE HCL 1 MG/ML IJ SOLN
0.5000 mg | INTRAMUSCULAR | Status: DC | PRN
  Administered 2023-01-14 – 2023-01-15 (×8): 0.5 mg via INTRAVENOUS
  Filled 2023-01-14: qty 0.5
  Filled 2023-01-14: qty 1
  Filled 2023-01-14 (×6): qty 0.5

## 2023-01-14 MED ORDER — ONDANSETRON HCL 4 MG PO TABS: 4.0000 mg | ORAL_TABLET | Freq: Four times a day (QID) | ORAL | Status: DC | PRN

## 2023-01-14 NOTE — H&P (Signed)
History and Physical    Christopher Henson ZOX:[id number] DOB: 01-28-1981 DOA: 01/13/2023  PCP: [name], PA-C   Patient coming from: Home   Chief Complaint: SOB, chest pain   HPI: Christopher Henson is a 42 y.o. 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   Patient reports developing chest discomfort and shortness of breath yesterday that feels nearly identical to the symptoms that prompted his presentation to the hospital 1 month ago.  Chest pain radiates to the shoulder blade and is worse with cough or deep breath.  He has not been coughing much and has been nonproductive.  He denies fevers or chills but has had sweats since the recent discharge.  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  He has since completed 1 cycle of carboplatinum/etoposide/Tecentriq.    ED Course: Upon arrival to the ED, patient is found to be afebrile and saturating well on 2 L/min of supplemental oxygen with tachypnea, mild tachycardia, and stable blood pressure.  Labs are notable for creatinine 1.36 and troponin normal x 2.  CTA chest is most notable for enlarging large left pleural effusion.  Patient was treated with morphine in the ED.  Review of Systems:  All other systems reviewed and apart from HPI, are negative.  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   GERD (gastroesophageal reflux  disease)    History of cryptococcal meningitis    Protein calorie malnutrition (HCC)    Small cell lung cancer, 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   Thrombocytopenia (HCC) 06/2018    Past Surgical History:  Procedure Laterality Date   BIOPSY  12/21/2022   Procedure: BIOPSY;  Surgeon: Tomma Lightning, MD;  Location: [facility];  Service: Endoscopy;;   BRONCHIAL WASHINGS  12/21/2022   Procedure: BRONCHIAL WASHINGS;  Surgeon: Tomma Lightning, MD;  Location: [facility];  Service: Endoscopy;;   ENDOBRONCHIAL ULTRASOUND Left 12/21/2022   Procedure: ENDOBRONCHIAL ULTRASOUND;  Surgeon: Tomma Lightning, MD;  Location: [facility];  Service: Endoscopy;  Laterality: Left;  Endobronchial ultrasound, biopsy, need fluoroscopy   FINE NEEDLE ASPIRATION  12/21/2022   Procedure: FINE NEEDLE ASPIRATION (FNA) LINEAR;  Surgeon: Tomma Lightning, MD;  Location: [facility];  Service: Endoscopy;;   IR BONE MARROW BIOPSY & ASPIRATION  12/27/2022   IR IMAGING GUIDED PORT INSERTION  12/27/2022   IR LUMBAR PUNCTURE  12/22/2022   LUMBAR PUNCTURE  06/2018   during eval for meningitis    VIDEO BRONCHOSCOPY Left 12/21/2022   Procedure: VIDEO BRONCHOSCOPY WITH FLUORO, endobronchial ultrasound;  Surgeon: Tomma Lightning, MD;  Location: [facility];  Service: Endoscopy;  Laterality: Left;  Schedule for 12/20/2022 12/21/2022    Social History:   reports that he quit smoking about 3 weeks ago. His smoking use included cigarettes. He has never used smokeless tobacco. He reports current alcohol use. He reports current drug use. Frequency: 7.00 times per week. Drug: Marijuana.  No Known Allergies  Family History  Problem Relation Age of Onset  Diabetes Mother    Hypertension Mother    Diabetes Father    Hypertension Father    Healthy Sister    Healthy Brother    Healthy Sister    Healthy Sister    Healthy Sister    Healthy Brother    Healthy Brother    Heart disease Neg Hx    Cancer Neg Hx    Stroke Neg Hx       Prior to Admission medications   Medication Sig Start Date End Date Taking? Authorizing Provider  amLODipine (NORVASC) 5 MG tablet Take 1 tablet (5 mg total) by mouth daily. Patient not taking: Reported on 01/10/2023 01/09/23   Tysinger, Kermit Balo, PA-C  benzonatate (TESSALON) 100 MG capsule Take 1 capsule (100 mg total) by mouth 3 (three) times daily as needed for cough. Patient not taking: Reported on 01/10/2023 01/03/23   Rhetta Mura, MD  bictegravir-emtricitabine-tenofovir AF (BIKTARVY) 50-200-25 MG TABS tablet Take 1 tablet by mouth daily. Patient not taking: Reported on 01/13/2023 01/02/23   Daiva Eves, Lisette Grinder, MD  dronabinol (MARINOL) 5 MG capsule Take 1 capsule (5 mg total) by mouth 2 (two) times daily before a meal. Patient not taking: Reported on 01/13/2023 01/10/23   Josph Macho, MD  fluconazole (DIFLUCAN) 200 MG tablet Take 1 tablet (200 mg total) by mouth daily for 30 doses. Patient not taking: Reported on 01/13/2023 01/02/23 02/01/23  Daiva Eves, Lisette Grinder, MD  HYDROmorphone (DILAUDID) 4 MG tablet Take 1 tablet (4 mg total) by mouth every 6 (six) hours as needed for severe pain. Patient not taking: Reported on 01/13/2023 01/10/23   Josph Macho, MD  senna-docusate (SENOKOT-S) 8.6-50 MG tablet Take 1 tablet by mouth 2 (two) times daily between meals as needed for mild constipation. Patient not taking: Reported on 01/09/2023 01/03/23   Rhetta Mura, MD  sulfamethoxazole-trimethoprim (BACTRIM DS) 800-160 MG tablet Take 1 tablet by mouth daily. Patient not taking: Reported on 01/13/2023 01/02/23 02/01/23  Randall Hiss, MD    Physical Exam: Vitals:   01/13/23 2149 01/14/23 0009 01/14/23 0030 01/14/23 0153  BP: 102/69 123/88 114/84 116/89  Pulse: (!) 103 87 (!) 101 99  Resp: 13 (!) 21 (!) 30 19  Temp: 98.1 F (36.7 C) 98.3 F (36.8 C)  98.3 F (36.8 C)  TempSrc: Oral Oral    SpO2: 98% 95% 99% 99%  Weight: 76.2 kg     Height: 6' (1.829 m)        Constitutional: NAD, calm  Eyes: PERTLA, lids and conjunctivae normal ENMT: Mucous membranes are moist. Posterior pharynx clear of any exudate or lesions.   Neck: supple, no masses  Respiratory: diminished on left, no wheezing, no crackles. Speaking full sentences.   Cardiovascular: S1 & S2 heard, regular rate and rhythm. No extremity edema.   Abdomen: No distension, no tenderness, soft. Bowel sounds active.  Musculoskeletal: no clubbing / cyanosis. No joint deformity upper and lower extremities.   Skin: no significant rashes, lesions, ulcers. Warm, dry, well-perfused. Neurologic: CN 2-12 grossly intact. Moving all extremities. Alert and oriented.  Psychiatric: Pleasant. Cooperative.    Labs and Imaging on Admission: I have personally reviewed following labs and imaging studies  CBC: Recent Labs  Lab 01/10/23 1110 01/13/23 2220  WBC 2.0* 3.8*  NEUTROABS 0.4*  --   HGB 14.3 14.6  HCT 39.7 41.6  MCV 86.9 90.0  PLT 273 315   Basic Metabolic Panel: Recent Labs  Lab 01/10/23 1110 01/13/23 2220  NA 142 140  K 4.0 3.8  CL 107 106  CO2 28 22  GLUCOSE 104* 129*  BUN 8 9  CREATININE 0.88 1.36*  CALCIUM 9.1 8.5*   GFR: Estimated Creatinine Clearance: 74.7 mL/min (A) (by C-G formula based on SCr of 1.36 mg/dL (H)). Liver Function Tests: Recent Labs  Lab 01/10/23 1110  AST 15  ALT 32  ALKPHOS 106  BILITOT 0.3  PROT 6.7  ALBUMIN 3.8   No results for input(s): "LIPASE", "AMYLASE" in the last 168 hours. No results for input(s): "AMMONIA" in the last 168 hours. Coagulation Profile: No results for input(s): "INR", "PROTIME" in the last 168 hours. Cardiac Enzymes: No results for input(s): "CKTOTAL", "CKMB", "CKMBINDEX", "TROPONINI" in the last 168 hours. BNP (last 3 results) No results for input(s): "PROBNP" in the last 8760 hours. HbA1C: No results for input(s): "HGBA1C" in the last 72 hours. CBG: No results for input(s): "GLUCAP" in the last 168 hours. Lipid  Profile: No results for input(s): "CHOL", "HDL", "LDLCALC", "TRIG", "CHOLHDL", "LDLDIRECT" in the last 72 hours. Thyroid Function Tests: No results for input(s): "TSH", "T4TOTAL", "FREET4", "T3FREE", "THYROIDAB" in the last 72 hours. Anemia Panel: No results for input(s): "VITAMINB12", "FOLATE", "FERRITIN", "TIBC", "IRON", "RETICCTPCT" in the last 72 hours. Urine analysis:    Component Value Date/Time   COLORURINE STRAW (A) 12/18/2022 0552   APPEARANCEUR CLEAR 12/18/2022 0552   LABSPEC 1.025 12/18/2022 0552   PHURINE 7.0 12/18/2022 0552   GLUCOSEU NEGATIVE 12/18/2022 0552   HGBUR NEGATIVE 12/18/2022 0552   BILIRUBINUR NEGATIVE 12/18/2022 0552   KETONESUR NEGATIVE 12/18/2022 0552   PROTEINUR NEGATIVE 12/18/2022 0552   NITRITE NEGATIVE 12/18/2022 0552   LEUKOCYTESUR NEGATIVE 12/18/2022 0552   Sepsis Labs: @LABRCNTIP (procalcitonin:4,lacticidven:4) )No results found for this or any previous visit (from the past 240 hour(s)).   Radiological Exams on Admission: CT Angio Chest Pulmonary Embolism (PE) W or WO Contrast  Result Date: 01/14/2023 CLINICAL DATA:  High probability pulmonary embolism, chest pain, dyspnea. Small cell lung cancer. EXAM: CT ANGIOGRAPHY CHEST WITH CONTRAST TECHNIQUE: Multidetector CT imaging of the chest was performed using the standard protocol during bolus administration of intravenous contrast. Multiplanar CT image reconstructions and MIPs were obtained to evaluate the vascular anatomy. RADIATION DOSE REDUCTION: This exam was performed according to the departmental dose-optimization program which includes automated exposure control, adjustment of the mA and/or kV according to patient size and/or use of iterative reconstruction technique. CONTRAST:  75mL OMNIPAQUE IOHEXOL 350 MG/ML SOLN COMPARISON:  None Available. FINDINGS: Cardiovascular: There is adequate opacification of the pulmonary arterial tree. No intraluminal filling defect identified to suggest acute pulmonary  embolism. Anterior mediastinal mass encases and narrows the left upper lobar pulmonary arterial branches and demonstrates extrinsic compression and narrowing of the left lower lobar pulmonary artery centrally. Mild coronary artery calcification. Global cardiac size within normal limits. Mediastinal shift to the right noted, progressive since prior examination. Stable small pericardial effusion stable anterior mediastinal/anterior pericardial mass in keeping with the patient's known mediastinal nodal disease. The thoracic aorta is unremarkable. Right internal jugular chest port tip seen within the superior right atrium. Mediastinum/Nodes: 8.6 x 10.7 x 10.5 cm anterior mediastinal/anterior pericardial mass appears unchanged likely representing a a mediastinal nodal conglomerate in this patient with small cell lung cancer. This appears confluent with pathologic left hilar adenopathy. No additional pathologic thoracic adenopathy. Visualized thyroid is unremarkable. Esophagus is unremarkable. Lungs/Pleura: Large left pleural effusion with complete collapse of the left lower lobe and compressive atelectasis of the left upper  lobe is again seen with enlargement of the pleural fluid component increasing atelectasis of the left upper lobe. Right lung is clear. No pneumothorax. No pleural effusion on the right. No central obstructing lesion. Upper Abdomen: Pathologic left pararenal adenopathy is present. No acute abnormality. Musculoskeletal: No acute bone abnormality. No lytic or blastic bone lesion. Review of the MIP images confirms the above findings. IMPRESSION: 1. No pulmonary embolism. 2. Stable anterior mediastinal/anterior pericardial mass in keeping with the patient's known mediastinal nodal disease in this patient with small cell lung cancer. 3. Enlarging, large left pleural effusion with progressive compressive atelectasis of the left lung and increasing mediastinal shift to the right. 4. Mild coronary artery  calcification. Electronically Signed   By: Helyn Numbers M.D.   On: 01/14/2023 01:38   DG Chest Port 1 View  Result Date: 01/13/2023 CLINICAL DATA:  Chest pain and shortness of breath EXAM: PORTABLE CHEST 1 VIEW COMPARISON:  Radiographs 01/10/2023 FINDINGS: Stable large left paramediastinal mass. Right IJ CVC tip in the low SVC. The right lung is clear. Slightly increased moderate-to-large left pleural effusion and associated airspace opacities. No pneumothorax. No displaced rib fractures. IMPRESSION: Slightly increased moderate-to-large left pleural effusion and associated airspace opacities. Redemonstrated large left paramediastinal mass. Electronically Signed   By: Minerva Fester M.D.   On: 01/13/2023 23:09    EKG: Independently reviewed. Sinus tachycardia, rate 105.   Assessment/Plan   1. Recurrent left pleural effusion; malignant  - Consult IR for therapeutic thoracentesis, consideration of PleurX catheter    2. AKI - SCr is 1.36, up from 0.88 three days earlier  - Check UA and urine chemistries, hydrate with IVF, repeat chem panel in am     3.  Small cell/neuroendocrine carcinoma of left lung  - Diagnosed during admission last month  - Completed 1 cycle of carboplatinum/etoposide/Tecentriq under the care of Dr. [name]  4. HIV; hx cryptococcal meningitis  - CD4 was 356 and VL undetectable in September 2024  - Continue Biktarvy, fluconazole     DVT prophylaxis: Lovenox  Code Status: Full   Level of Care: Level of care: Telemetry Family Communication: None present   Disposition Plan:  Patient is from: home  Anticipated d/c is to: Home  Anticipated d/c date is: 10/7 or 01/16/23  Patient currently: Pending evaluation by IR  Consults called: IR consult requested via Epic order  Admission status: Observation     Briscoe Deutscher, MD [facility]  01/14/2023, 2:20 AM

## 2023-01-14 NOTE — Progress Notes (Signed)
Progress Note   Patient: Christopher Henson WUJ:[id number] DOB: [birth year] DOA: 01/13/2023     0 DOS: the patient was seen and examined on 01/14/2023   Brief hospital course: 42 y.o. 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    Patient reports developing chest discomfort and shortness of breath yesterday that feels nearly identical to the symptoms that prompted his presentation to the hospital 1 month ago.  Chest pain radiates to the shoulder blade and is worse with cough or deep breath.  He has not been coughing much and has been nonproductive.  He denies fevers or chills but has had sweats since the recent discharge.  Upon arrival to the ED, patient is found to be afebrile and saturating well on 2 L/min of supplemental oxygen with tachypnea, mild tachycardia, and stable blood pressure.  Labs are notable for creatinine 1.36 and troponin normal x 2.  CTA chest is most notable for enlarging large left pleural effusion.   Assessment and Plan: 1. Recurrent left pleural effusion; malignant  - IR consulted -Appreciate input by Dr. [name]. Recs for Pleurix noted. Had already discussed with IR, plans for Pleurix tomorrow -Cont with O2 as needed and analgesia as needed    2. AKI - SCr is 1.36, up from 0.88 three days earlier  - renal function improved with hydration     3.  Small cell/neuroendocrine carcinoma of left lung  - Diagnosed during admission last month  - Completed 1 cycle of carboplatinum/etoposide/Tecentriq under the care of Dr. [name] -Per Oncology, May have to treat him inpt. Oncology is considering immunotherapy with the chemo   4. HIV; hx cryptococcal meningitis  - CD4 was 356 and VL undetectable in September 2024  - Continue Biktarvy, fluconazole      Subjective: Is complaining of some sob this AM. Very eager to resume chemo as scheduled  Physical Exam: Vitals:   01/14/23 0910 01/14/23 0935 01/14/23 1335 01/14/23 1709  BP: (!) 119/90 (!) 132/104 125/86 (!) 144/100  Pulse: 84 84 96 93  Resp: 18 16 20 18   Temp:  98.1 F (36.7 C) 98.3 F (36.8 C) 98.3 F (36.8 C)  TempSrc:  Oral Oral Oral  SpO2: 95% 100% 100% 99%  Weight:      Height:       General exam: Awake, laying in bed, in nad Respiratory system: Normal respiratory effort, decreased BS on L  Cardiovascular system: regular rate, s1, s2 Gastrointestinal system: Soft, nondistended, positive BS Central nervous system: CN2-12 grossly intact, strength intact Extremities: Perfused, no clubbing Skin: Normal skin turgor, no notable skin lesions seen Psychiatry: Mood normal // no visual hallucinations   Data Reviewed:  Labs reviewed: Na 144, K 3.1, Cr 0.78, WBC 3.8, Hgb 13.3, Plts 297  Family Communication: Pt in room, family not at bedside  Disposition: Status is: Observation The patient remains OBS appropriate and will d/c before 2 midnights.  Planned Discharge Destination: Home     Author: Rickey Barbara, MD 01/14/2023 6:01 PM  For on call review [email or web address].

## 2023-01-14 NOTE — Hospital Course (Signed)
42 y.o. 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    Patient reports developing chest discomfort and shortness of breath yesterday that feels nearly identical to the symptoms that prompted his presentation to the hospital 1 month ago.  Chest pain radiates to the shoulder blade and is worse with cough or deep breath.  He has not been coughing much and has been nonproductive.  He denies fevers or chills but has had sweats since the recent discharge.  Upon arrival to the ED, patient is found to be afebrile and saturating well on 2 L/min of supplemental oxygen with tachypnea, mild tachycardia, and stable blood pressure.  Labs are notable for creatinine 1.36 and troponin normal x 2.  CTA chest is most notable for enlarging large left pleural effusion.

## 2023-01-14 NOTE — Consult Note (Signed)
[name] is well-known to me.  He is a very nice 42 year old African-American male.  He has history of HIV.  This is under good control.  He recently was diagnosed with stage IV large cell neuroendocrine carcinoma.  He has she has had 1 cycle of chemotherapy with carboplatin/etoposide.  You initially presented with a large left pleural effusion.  This is recurred several times.  He subsequently was admitted on 01/13/2023 with another pleural effusion on the left side.  He had a CT angiogram of the chest.  It showed stable mediastinal pericardial mass.  He had large left pleural effusion.  He has had some pain issues.  He is on hydromorphone.  He was to be on a Duragesic patch.  For some reason, this is not on his medication list.  He is going need to have a Pleurx catheter in my opinion.  We will have to see if Interventional Radiology will be able to do this.  He is not having any nausea or vomiting.  He is having no fever.  There is no bleeding.  He has had no change in bowel or bladder habits.  There has been no leg swelling.  His CBC today shows white count 3.8.  Hemoglobin 13.3.  Platelet count 297,000.  His sodium 144.  Potassium 3.1.  BUN 7 creatinine 0.78.  Calcium 6.9.  Unfortunate, we do not have a semen on him.  I suspect is probably going to need to have some calcium and potassium.    On his physical exam, his temperature is 98.3.  Pulse 93.  Blood pressure 144/100.  Oxygen saturation 99%.  Head and neck exam shows no ocular or oral lesions.  He has no mucositis.  There is no adenopathy in the neck.  Lungs show decreased breath sounds about two thirds the way up the left lung.  Right lung is clear.  Cardiac exam regular rate and rhythm.  He has no murmurs.  Abdomen is soft.  Bowel sounds are present.  There is no fluid wave.  There is no palpable liver or spleen tip.  Extremity shows no clubbing, cyanosis or edema.  Neurological exam shows no focal neurological deficits.    Mr. Folts  has a neuroendocrine carcinoma.  I would think that this was truly small cell carcinoma, he would have had a response.  Unfortunate, I think that he has not had a response.  His disease might be stable.  Regardless, I think were going to have to treat this as a large cell neuroendocrine carcinoma.  We will have to see how we can treat this inpatient.  I really would prefer to treat as an outpatient as that is where give immunotherapy along with chemotherapy.  1 option might be to consider chemo radiation therapy.  He is going need to have a Pleurx catheter.  I think the order has already been put in for this.  Interventional Radiology will do this tomorrow.  We will have to see how his labs look.  I think they probably will will be due for treatment this week.  We may have to treat him inpatient.  Again, I really would like to use immunotherapy with the chemotherapy.  I just hate that he is back in the hospital.  I really thought that we would get some "mileage" out of the initial chemotherapy that we used.  We will follow along closely.  I do appreciate all the great care that he we will be getting on 6E.  Christin Bach, MD  Fayrene Fearing 4:7

## 2023-01-14 NOTE — ED Notes (Signed)
ED TO INPATIENT HANDOFF REPORT  ED Nurse Name and Phone #:  Mellody Dance  -  [phone]  S Name/Age/Gender Christopher Henson 42 y.o. male Room/Bed: WA19/WA19  Code Status   Code Status: Full Code  Home/SNF/Other Home Patient oriented to: self, place, time, and situation Is this baseline? Yes   Triage Complete: Triage complete  Chief Complaint Pleural effusion, malignant [J91.0]  Triage Note Pt c/o chest pain and SHOB x a few hours. Pt wears 2lpm [place], does chemo for small cell lung cancer and mediastinal mass.    Allergies No Known Allergies  Level of Care/Admitting Diagnosis ED Disposition     ED Disposition  Admit   Condition  --   Comment  Hospital Area: [facility] [[id number]]  Level of Care: Telemetry [5]  Admit to tele based on following criteria: Monitor for Ischemic changes  May place patient in observation at [facility] or [facility] if equivalent level of care is available:: Yes  Covid Evaluation: Asymptomatic - no recent exposure (last 10 days) testing not required  Diagnosis: Pleural effusion, malignant [454098]  Admitting Physician: Briscoe Deutscher [[id number]]  Attending Physician: Briscoe Deutscher [[id number]]          B Medical/Surgery History 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   Thrombocytopenia (HCC) 06/2018   Past Surgical History:  Procedure Laterality Date   BIOPSY  12/21/2022   Procedure: BIOPSY;  Surgeon: Tomma Lightning, MD;  Location: [facility];  Service: Endoscopy;;   BRONCHIAL WASHINGS  12/21/2022   Procedure: BRONCHIAL WASHINGS;  Surgeon: Tomma Lightning, MD;  Location: [facility];  Service: Endoscopy;;   ENDOBRONCHIAL ULTRASOUND Left  12/21/2022   Procedure: ENDOBRONCHIAL ULTRASOUND;  Surgeon: Tomma Lightning, MD;  Location: [facility];  Service: Endoscopy;  Laterality: Left;  Endobronchial ultrasound, biopsy, need fluoroscopy   FINE NEEDLE ASPIRATION  12/21/2022   Procedure: FINE NEEDLE ASPIRATION (FNA) LINEAR;  Surgeon: Tomma Lightning, MD;  Location: [facility];  Service: Endoscopy;;   IR BONE MARROW BIOPSY & ASPIRATION  12/27/2022   IR IMAGING GUIDED PORT INSERTION  12/27/2022   IR LUMBAR PUNCTURE  12/22/2022   LUMBAR PUNCTURE  06/2018   during eval for meningitis    VIDEO BRONCHOSCOPY Left 12/21/2022   Procedure: VIDEO BRONCHOSCOPY WITH FLUORO, endobronchial ultrasound;  Surgeon: Tomma Lightning, MD;  Location: [facility];  Service: Endoscopy;  Laterality: Left;  Schedule for 12/20/2022 12/21/2022     A IV Location/Drains/Wounds Patient Lines/Drains/Airways Status     Active Line/Drains/Airways     Name Placement date Placement time Site Days   Implanted Port 12/27/22 Right Chest 12/27/22  1037  Chest  18   Peripheral IV 01/13/23 20 G Anterior;Distal;Left;Upper Arm 01/13/23  2221  Arm  1            Intake/Output Last 24 hours  Intake/Output Summary (Last 24 hours) at 01/14/2023 0731 Last data filed at 01/14/2023 0865 Gross per 24 hour  Intake 1000 ml  Output --  Net 1000 ml    Labs/Imaging Results for orders placed or performed during the hospital encounter of 01/13/23 (from the past 48 hour(s))  Basic metabolic panel     Status: Abnormal  Collection Time: 01/13/23 10:20 PM  Result Value Ref Range   Sodium 140 135 - 145 mmol/L   Potassium 3.8 3.5 - 5.1 mmol/L   Chloride 106 98 - 111 mmol/L   CO2 22 22 - 32 mmol/L   Glucose, Bld 129 (H) 70 - 99 mg/dL    Comment: Glucose reference range applies only to samples taken after fasting for at least 8 hours.   BUN 9 6 - 20 mg/dL   Creatinine, Ser 1.61 (H) 0.61 - 1.24 mg/dL   Calcium 8.5 (L) 8.9 - 10.3 mg/dL   GFR, Estimated >09 >60 mL/min     Comment: (NOTE) Calculated using the CKD-EPI Creatinine Equation (2021)    Anion gap 12 5 - 15    Comment: Performed at [facility], [address] [place], [place] [address]  CBC     Status: Abnormal   Collection Time: 01/13/23 10:20 PM  Result Value Ref Range   WBC 3.8 (L) 4.0 - 10.5 K/uL   RBC 4.62 4.22 - 5.81 MIL/uL   Hemoglobin 14.6 13.0 - 17.0 g/dL   HCT 81.1 91.4 - 78.2 %   MCV 90.0 80.0 - 100.0 fL   MCH 31.6 26.0 - 34.0 pg   MCHC 35.1 30.0 - 36.0 g/dL   RDW 95.6 21.3 - 08.6 %   Platelets 315 150 - 400 K/uL   nRBC 0.0 0.0 - 0.2 %    Comment: Performed at [facility], [address], [place], [place] [address]  Troponin I (High Sensitivity)     Status: None   Collection Time: 01/13/23 10:20 PM  Result Value Ref Range   Troponin I (High Sensitivity) 6 <18 ng/L    Comment: (NOTE) Elevated high sensitivity troponin I (hsTnI) values and significant  changes across serial measurements may suggest ACS but many other  chronic and acute conditions are known to elevate hsTnI results.  Refer to the "Links" section for chest pain algorithms and additional  guidance. Performed at [facility], [address] [place], [place] [address]   Troponin I (High Sensitivity)     Status: None   Collection Time: 01/14/23 12:20 AM  Result Value Ref Range   Troponin I (High Sensitivity) 6 <18 ng/L    Comment: (NOTE) Elevated high sensitivity troponin I (hsTnI) values and significant  changes across serial measurements may suggest ACS but many other  chronic and acute conditions are known to elevate hsTnI results.  Refer to the "Links" section for chest pain algorithms and additional  guidance. Performed at [facility], [address], [place], [place] [address]   CBC     Status: Abnormal   Collection Time: 01/14/23  6:57 AM  Result Value Ref Range   WBC 3.8 (L) 4.0 - 10.5 K/uL   RBC 4.23 4.22 - 5.81 MIL/uL    Hemoglobin 13.3 13.0 - 17.0 g/dL   HCT 24.4 (L) 01.0 - 27.2 %   MCV 91.7 80.0 - 100.0 fL   MCH 31.4 26.0 - 34.0 pg   MCHC 34.3 30.0 - 36.0 g/dL   RDW 53.6 64.4 - 03.4 %   Platelets 297 150 - 400 K/uL   nRBC 0.0 0.0 - 0.2 %    Comment: Performed at [facility], [address], [place], [place] [address]   CT Angio Chest Pulmonary Embolism (PE) W or WO Contrast  Result Date: 01/14/2023 CLINICAL DATA:  High probability pulmonary embolism, chest pain,  dyspnea. Small cell lung cancer. EXAM: CT ANGIOGRAPHY CHEST WITH CONTRAST TECHNIQUE: Multidetector CT imaging of the chest was performed using the standard protocol during bolus administration of intravenous contrast. Multiplanar CT image reconstructions and MIPs were obtained to evaluate the vascular anatomy. RADIATION DOSE REDUCTION: This exam was performed according to the departmental dose-optimization program which includes automated exposure control, adjustment of the mA and/or kV according to patient size and/or use of iterative reconstruction technique. CONTRAST:  75mL OMNIPAQUE IOHEXOL 350 MG/ML SOLN COMPARISON:  None Available. FINDINGS: Cardiovascular: There is adequate opacification of the pulmonary arterial tree. No intraluminal filling defect identified to suggest acute pulmonary embolism. Anterior mediastinal mass encases and narrows the left upper lobar pulmonary arterial branches and demonstrates extrinsic compression and narrowing of the left lower lobar pulmonary artery centrally. Mild coronary artery calcification. Global cardiac size within normal limits. Mediastinal shift to the right noted, progressive since prior examination. Stable small pericardial effusion stable anterior mediastinal/anterior pericardial mass in keeping with the patient's known mediastinal nodal disease. The thoracic aorta is unremarkable. Right internal jugular chest port tip seen within the superior right atrium. Mediastinum/Nodes: 8.6 x 10.7  x 10.5 cm anterior mediastinal/anterior pericardial mass appears unchanged likely representing a a mediastinal nodal conglomerate in this patient with small cell lung cancer. This appears confluent with pathologic left hilar adenopathy. No additional pathologic thoracic adenopathy. Visualized thyroid is unremarkable. Esophagus is unremarkable. Lungs/Pleura: Large left pleural effusion with complete collapse of the left lower lobe and compressive atelectasis of the left upper lobe is again seen with enlargement of the pleural fluid component increasing atelectasis of the left upper lobe. Right lung is clear. No pneumothorax. No pleural effusion on the right. No central obstructing lesion. Upper Abdomen: Pathologic left pararenal adenopathy is present. No acute abnormality. Musculoskeletal: No acute bone abnormality. No lytic or blastic bone lesion. Review of the MIP images confirms the above findings. IMPRESSION: 1. No pulmonary embolism. 2. Stable anterior mediastinal/anterior pericardial mass in keeping with the patient's known mediastinal nodal disease in this patient with small cell lung cancer. 3. Enlarging, large left pleural effusion with progressive compressive atelectasis of the left lung and increasing mediastinal shift to the right. 4. Mild coronary artery calcification. Electronically Signed   By: Helyn Numbers M.D.   On: 01/14/2023 01:38   DG Chest Port 1 View  Result Date: 01/13/2023 CLINICAL DATA:  Chest pain and shortness of breath EXAM: PORTABLE CHEST 1 VIEW COMPARISON:  Radiographs 01/10/2023 FINDINGS: Stable large left paramediastinal mass. Right IJ CVC tip in the low SVC. The right lung is clear. Slightly increased moderate-to-large left pleural effusion and associated airspace opacities. No pneumothorax. No displaced rib fractures. IMPRESSION: Slightly increased moderate-to-large left pleural effusion and associated airspace opacities. Redemonstrated large left paramediastinal mass.  Electronically Signed   By: Minerva Fester M.D.   On: 01/13/2023 23:09    Pending Labs Unresulted Labs (From admission, onward)     Start     Ordered   01/21/23 0500  Creatinine, serum  (enoxaparin (LOVENOX)    CrCl >/= 30 ml/min)  Weekly,   R     Comments: while on enoxaparin therapy    01/14/23 0218   01/14/23 0500  Basic metabolic panel  Daily,   R      01/14/23 0218   01/14/23 0500  CBC  Daily,   R      01/14/23 0218   01/14/23 0218  Urinalysis, Complete w Microscopic -Urine, Clean Catch  Once,   R  Question:  Specimen Source  Answer:  Urine, Clean Catch   01/14/23 0218   01/14/23 0218  Sodium, urine, random  Once,   R        01/14/23 0218   01/14/23 0218  Creatinine, urine, random  Once,   R        01/14/23 0218   Pending  CBC with Differential  Once,   R        Pending   Pending  Comprehensive metabolic panel  Once,   R        Pending            Vitals/Pain Today's Vitals   01/14/23 0315 01/14/23 0400 01/14/23 0415 01/14/23 0602  BP: (!) 130/97 (!) 126/93 (!) 128/96 123/87  Pulse: 89 99 93 82  Resp: (!) 24 (!) 22 (!) 26 (!) 23  Temp:    98 F (36.7 C)  TempSrc:      SpO2: 100% 96% 96% 99%  Weight:      Height:      PainSc:        Isolation Precautions No active isolations  Medications Medications  bictegravir-emtricitabine-tenofovir AF (BIKTARVY) 50-200-25 MG per tablet 1 tablet (has no administration in time range)  fluconazole (DIFLUCAN) tablet 200 mg (has no administration in time range)  sulfamethoxazole-trimethoprim (BACTRIM DS) 800-160 MG per tablet 1 tablet (has no administration in time range)  enoxaparin (LOVENOX) injection 40 mg (has no administration in time range)  sodium chloride flush (NS) 0.9 % injection 3 mL (3 mLs Intravenous Not Given 01/14/23 0240)  acetaminophen (TYLENOL) tablet 650 mg (has no administration in time range)    Or  acetaminophen (TYLENOL) suppository 650 mg (has no administration in time range)  oxyCODONE (Oxy  IR/ROXICODONE) immediate release tablet 5 mg (has no administration in time range)  HYDROmorphone (DILAUDID) injection 0.5 mg (0.5 mg Intravenous Given 01/14/23 0658)  senna-docusate (Senokot-S) tablet 1 tablet (has no administration in time range)  bisacodyl (DULCOLAX) EC tablet 5 mg (has no administration in time range)  ondansetron (ZOFRAN) tablet 4 mg (has no administration in time range)    Or  ondansetron (ZOFRAN) injection 4 mg (has no administration in time range)  guaiFENesin (ROBITUSSIN) 100 MG/5ML liquid 5 mL (has no administration in time range)  morphine (PF) 4 MG/ML injection 4 mg (4 mg Intravenous Given 01/14/23 0029)  iohexol (OMNIPAQUE) 350 MG/ML injection 75 mL (75 mLs Intravenous Contrast Given 01/14/23 0037)  sodium chloride 0.9 % bolus 1,000 mL (0 mLs Intravenous Stopped 01/14/23 0651)    Mobility walks     Focused Assessments    R Recommendations: See Admitting Provider Note  Report given to:   Additional Notes:

## 2023-01-15 ENCOUNTER — Encounter: Payer: Self-pay | Admitting: *Deleted

## 2023-01-15 ENCOUNTER — Inpatient Hospital Stay: Payer: Medicaid Other

## 2023-01-15 ENCOUNTER — Observation Stay (HOSPITAL_COMMUNITY): Payer: 59

## 2023-01-15 ENCOUNTER — Inpatient Hospital Stay: Payer: 59

## 2023-01-15 DIAGNOSIS — C3402 Malignant neoplasm of left main bronchus: Secondary | ICD-10-CM

## 2023-01-15 DIAGNOSIS — K219 Gastro-esophageal reflux disease without esophagitis: Secondary | ICD-10-CM | POA: Diagnosis present

## 2023-01-15 DIAGNOSIS — Z8249 Family history of ischemic heart disease and other diseases of the circulatory system: Secondary | ICD-10-CM | POA: Diagnosis not present

## 2023-01-15 DIAGNOSIS — F1021 Alcohol dependence, in remission: Secondary | ICD-10-CM | POA: Diagnosis present

## 2023-01-15 DIAGNOSIS — N179 Acute kidney failure, unspecified: Secondary | ICD-10-CM | POA: Diagnosis present

## 2023-01-15 DIAGNOSIS — J9611 Chronic respiratory failure with hypoxia: Secondary | ICD-10-CM | POA: Diagnosis present

## 2023-01-15 DIAGNOSIS — C3492 Malignant neoplasm of unspecified part of left bronchus or lung: Secondary | ICD-10-CM | POA: Diagnosis not present

## 2023-01-15 DIAGNOSIS — J9811 Atelectasis: Secondary | ICD-10-CM | POA: Diagnosis not present

## 2023-01-15 DIAGNOSIS — J910 Malignant pleural effusion: Secondary | ICD-10-CM | POA: Diagnosis not present

## 2023-01-15 DIAGNOSIS — R079 Chest pain, unspecified: Secondary | ICD-10-CM | POA: Diagnosis not present

## 2023-01-15 DIAGNOSIS — C3490 Malignant neoplasm of unspecified part of unspecified bronchus or lung: Secondary | ICD-10-CM | POA: Diagnosis not present

## 2023-01-15 DIAGNOSIS — J90 Pleural effusion, not elsewhere classified: Secondary | ICD-10-CM | POA: Diagnosis not present

## 2023-01-15 DIAGNOSIS — B20 Human immunodeficiency virus [HIV] disease: Secondary | ICD-10-CM | POA: Diagnosis present

## 2023-01-15 DIAGNOSIS — J9859 Other diseases of mediastinum, not elsewhere classified: Secondary | ICD-10-CM | POA: Diagnosis not present

## 2023-01-15 DIAGNOSIS — Z833 Family history of diabetes mellitus: Secondary | ICD-10-CM | POA: Diagnosis not present

## 2023-01-15 DIAGNOSIS — C7A1 Malignant poorly differentiated neuroendocrine tumors: Secondary | ICD-10-CM | POA: Diagnosis present

## 2023-01-15 DIAGNOSIS — Z87891 Personal history of nicotine dependence: Secondary | ICD-10-CM | POA: Diagnosis not present

## 2023-01-15 DIAGNOSIS — Z79899 Other long term (current) drug therapy: Secondary | ICD-10-CM | POA: Diagnosis not present

## 2023-01-15 DIAGNOSIS — I10 Essential (primary) hypertension: Secondary | ICD-10-CM | POA: Diagnosis present

## 2023-01-15 HISTORY — DX: Malignant poorly differentiated neuroendocrine tumors: C7A.1

## 2023-01-15 HISTORY — PX: IR PERC PLEURAL DRAIN W/INDWELL CATH W/IMG GUIDE: IMG5383

## 2023-01-15 LAB — CBC WITH DIFFERENTIAL/PLATELET
Abs Immature Granulocytes: 0.04 10*3/uL (ref 0.00–0.07)
Basophils Absolute: 0 10*3/uL (ref 0.0–0.1)
Basophils Relative: 1 %
Eosinophils Absolute: 0.1 10*3/uL (ref 0.0–0.5)
Eosinophils Relative: 2 %
HCT: 41.1 % (ref 39.0–52.0)
Hemoglobin: 14.1 g/dL (ref 13.0–17.0)
Immature Granulocytes: 1 %
Lymphocytes Relative: 34 %
Lymphs Abs: 1.6 10*3/uL (ref 0.7–4.0)
MCH: 31.6 pg (ref 26.0–34.0)
MCHC: 34.3 g/dL (ref 30.0–36.0)
MCV: 92.2 fL (ref 80.0–100.0)
Monocytes Absolute: 0.8 10*3/uL (ref 0.1–1.0)
Monocytes Relative: 18 %
Neutro Abs: 2.1 10*3/uL (ref 1.7–7.7)
Neutrophils Relative %: 44 %
Platelets: 302 10*3/uL (ref 150–400)
RBC: 4.46 MIL/uL (ref 4.22–5.81)
RDW: 13.3 % (ref 11.5–15.5)
WBC: 4.6 10*3/uL (ref 4.0–10.5)
nRBC: 0 % (ref 0.0–0.2)

## 2023-01-15 LAB — COMPREHENSIVE METABOLIC PANEL
ALT: 29 U/L (ref 0–44)
AST: 21 U/L (ref 15–41)
Albumin: 3.4 g/dL — ABNORMAL LOW (ref 3.5–5.0)
Alkaline Phosphatase: 92 U/L (ref 38–126)
Anion gap: 12 (ref 5–15)
BUN: 9 mg/dL (ref 6–20)
CO2: 23 mmol/L (ref 22–32)
Calcium: 9.1 mg/dL (ref 8.9–10.3)
Chloride: 104 mmol/L (ref 98–111)
Creatinine, Ser: 1.05 mg/dL (ref 0.61–1.24)
GFR, Estimated: >60 mL/min (ref >60)
Glucose, Bld: 110 mg/dL — ABNORMAL HIGH (ref 70–99)
Potassium: 4.5 mmol/L (ref 3.5–5.1)
Sodium: 139 mmol/L (ref 135–145)
Total Bilirubin: 0.5 mg/dL (ref 0.3–1.2)
Total Protein: 6.8 g/dL (ref 6.5–8.1)

## 2023-01-15 LAB — LACTATE DEHYDROGENASE: LDH: 683 U/L — ABNORMAL HIGH (ref 98–192)

## 2023-01-15 LAB — PROTIME-INR
INR: 1 (ref 0.8–1.2)
Prothrombin Time: 13.1 s (ref 11.4–15.2)

## 2023-01-15 MED ORDER — MIDAZOLAM HCL 2 MG/2ML IJ SOLN
INTRAMUSCULAR | Status: AC | PRN
  Administered 2023-01-15 (×2): 1 mg via INTRAVENOUS

## 2023-01-15 MED ORDER — CEFAZOLIN SODIUM-DEXTROSE 2-4 GM/100ML-% IV SOLN
INTRAVENOUS | Status: AC
  Filled 2023-01-15: qty 100

## 2023-01-15 MED ORDER — LIDOCAINE-EPINEPHRINE 1 %-1:100000 IJ SOLN
20.0000 mL | Freq: Once | INTRAMUSCULAR | Status: AC
  Administered 2023-01-15: 10 mL via INTRADERMAL
  Filled 2023-01-15: qty 20

## 2023-01-15 MED ORDER — LIDOCAINE-EPINEPHRINE 1 %-1:100000 IJ SOLN
INTRAMUSCULAR | Status: AC
  Filled 2023-01-15: qty 1

## 2023-01-15 MED ORDER — FENTANYL CITRATE (PF) 100 MCG/2ML IJ SOLN
INTRAMUSCULAR | Status: AC | PRN
Start: 2023-01-15 — End: 2023-01-15
  Administered 2023-01-15 (×2): 50 ug via INTRAVENOUS

## 2023-01-15 MED ORDER — FENTANYL CITRATE (PF) 100 MCG/2ML IJ SOLN
INTRAMUSCULAR | Status: AC
  Filled 2023-01-15: qty 2

## 2023-01-15 MED ORDER — ONDANSETRON HCL 4 MG/2ML IJ SOLN
INTRAMUSCULAR | Status: AC | PRN
Start: 2023-01-15 — End: 2023-01-15
  Administered 2023-01-15: 4 mg via INTRAVENOUS

## 2023-01-15 MED ORDER — ONDANSETRON HCL 4 MG/2ML IJ SOLN
INTRAMUSCULAR | Status: AC
  Filled 2023-01-15: qty 2

## 2023-01-15 MED ORDER — MIDAZOLAM HCL 2 MG/2ML IJ SOLN
INTRAMUSCULAR | Status: AC
  Filled 2023-01-15: qty 4

## 2023-01-15 MED ORDER — FENTANYL 25 MCG/HR TD PT72
1.0000 | MEDICATED_PATCH | TRANSDERMAL | Status: DC
  Administered 2023-01-15: 1 via TRANSDERMAL
  Filled 2023-01-15: qty 1

## 2023-01-15 MED ORDER — TRAZODONE HCL 50 MG PO TABS
25.0000 mg | ORAL_TABLET | Freq: Every evening | ORAL | Status: AC | PRN
  Administered 2023-01-15 – 2023-01-16 (×2): 25 mg via ORAL
  Filled 2023-01-15 (×2): qty 1

## 2023-01-15 MED ORDER — CEFAZOLIN SODIUM-DEXTROSE 2-4 GM/100ML-% IV SOLN
2.0000 g | INTRAVENOUS | Status: AC
  Administered 2023-01-15: 2 g via INTRAVENOUS

## 2023-01-15 MED ORDER — HYDROMORPHONE HCL 1 MG/ML IJ SOLN
0.5000 mg | INTRAMUSCULAR | Status: DC | PRN
  Administered 2023-01-16 – 2023-01-17 (×6): 1 mg via INTRAVENOUS
  Filled 2023-01-15 (×6): qty 1

## 2023-01-15 NOTE — Plan of Care (Signed)
  Problem: Education: Goal: Knowledge of General Education information will improve Description: Including pain rating scale, medication(s)/side effects and non-pharmacologic comfort measures Outcome: Progressing   Problem: Clinical Measurements: Goal: Ability to maintain clinical measurements within normal limits will improve Outcome: Progressing Goal: Will remain free from infection Outcome: Progressing Goal: Diagnostic test results will improve Outcome: Progressing Goal: Respiratory complications will improve Outcome: Progressing   Problem: Clinical Measurements: Goal: Will remain free from infection Outcome: Progressing   Problem: Clinical Measurements: Goal: Diagnostic test results will improve Outcome: Progressing   Problem: Clinical Measurements: Goal: Respiratory complications will improve Outcome: Progressing   Problem: Activity: Goal: Risk for activity intolerance will decrease Outcome: Progressing   Problem: Nutrition: Goal: Adequate nutrition will be maintained Outcome: Progressing   Problem: Coping: Goal: Level of anxiety will decrease Outcome: Progressing   Problem: Elimination: Goal: Will not experience complications related to bowel motility Outcome: Progressing   Problem: Safety: Goal: Ability to remain free from injury will improve Outcome: Progressing   Problem: Pain Managment: Goal: General experience of comfort will improve Outcome: Progressing   Problem: Skin Integrity: Goal: Risk for impaired skin integrity will decrease Outcome: Progressing

## 2023-01-15 NOTE — Progress Notes (Signed)
MEDICATION-RELATED CONSULT NOTE   IR Procedure Consult - Anticoagulant/Antiplatelet PTA/Inpatient Med List Review by Pharmacist    Procedure: placement of a tunneled left sided pleural drainage catheter.     Completed: 01/15/23 at 1:58p  Post-Procedural bleeding risk per IR MD assessment:  standard   Antithrombotic medications on inpatient or PTA profile prior to procedure:    - LMWH 40 mg daily   Recommended restart time per IR Post-Procedure Guidelines:   - Day + 1 (Next AM)    - LMWH 40mg  daily already entered by IR team to start on 01/16/23  - Pharmacy will sign off   Dorna Leitz, PharmD, BCPS 01/15/2023 2:19 PM

## 2023-01-15 NOTE — Progress Notes (Signed)
Patient came into the office last week after his recent hospitalization with the plan to continue to treatment. He was found to have a recurrent pleural effusion. Treatment was held and rescheduled for this week. Unfortunately over the weekend he was admitted for recurrent effusion. Will follow for post discharge needs and office follow up.   Oncology Nurse Navigator Documentation     01/15/2023   10:15 AM  Oncology Nurse Navigator Flowsheets  Navigator Follow Up Date: 01/19/2023  Navigator Follow Up Reason: Appointment Review  Navigator Location [facility]  Navigator Encounter Type Appt/Treatment Plan Review  Patient Visit Type MedOnc  Treatment Phase Active Tx  Barriers/Navigation Needs Coordination of Care;Education  Interventions None Required  Acuity Level 2-Minimal Needs (1-2 Barriers Identified)  Time Spent with Patient 15

## 2023-01-15 NOTE — Progress Notes (Signed)
Progress Note   Patient: Christopher Henson WGN:[id number] DOB: 1980-10-12 DOA: 01/13/2023     0 DOS: the patient was seen and examined on 01/15/2023   Brief hospital course: 42 y.o. 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    Patient reports developing chest discomfort and shortness of breath yesterday that feels nearly identical to the symptoms that prompted his presentation to the hospital 1 month ago.  Chest pain radiates to the shoulder blade and is worse with cough or deep breath.  He has not been coughing much and has been nonproductive.  He denies fevers or chills but has had sweats since the recent discharge.  Upon arrival to the ED, patient is found to be afebrile and saturating well on 2 L/min of supplemental oxygen with tachypnea, mild tachycardia, and stable blood pressure.  Labs are notable for creatinine 1.36 and troponin normal x 2.  CTA chest is most notable for enlarging large left pleural effusion.   Assessment and Plan: 1. Recurrent left pleural effusion; malignant  - IR consulted and pt is now s/p Pleurx placement 10/7 -Appreciate input by Dr. [name]. Plans for outpt chemo and immunotherapy -Cont with O2 as needed and analgesia as needed    2. AKI - renal function improved with hydration     3.  Small cell/neuroendocrine carcinoma of left lung  - Diagnosed during admission last month  - Completed 1 cycle of carboplatinum/etoposide/Tecentriq under the care of Dr. [name] -Per Oncology, plan for immunotherapy with chemo   4. HIV; hx cryptococcal meningitis  - CD4 was 356 and VL undetectable in September 2024  - Continue Biktarvy, fluconazole     Subjective: Pt was seen prior to Pleurx placement. Pt reported being very eager to have procedure  performed. Complained of lower L chest discomfort  Physical Exam: Vitals:   01/15/23 1205 01/15/23 1210 01/15/23 1215 01/15/23 1323  BP: (!) 127/96 (!) 125/102 (!) 154/111 (!) 131/104  Pulse:    96  Resp: (!) 24 20 20 16   Temp:    98 F (36.7 C)  TempSrc:    Oral  SpO2:    91%  Weight:      Height:       General exam: Conversant, in no acute distress Respiratory system: normal chest rise, clear, no audible wheezing, decreased BS on L Cardiovascular system: regular rhythm, s1-s2 Gastrointestinal system: Nondistended, nontender, pos BS Central nervous system: No seizures, no tremors Extremities: No cyanosis, no joint deformities Skin: No rashes, no pallor Psychiatry: Affect normal // no auditory hallucinations    Data Reviewed:  Labs reviewed: Na 139, K 4.5, Cr 1.05, WBC 4.6, Hgb 14.1, Plts 302  Family Communication: Pt in room, family not at bedside  Disposition: Status is: Observation The patient will require care spanning > 2 midnights and should be moved to inpatient because: severity of illness  Planned Discharge Destination: Home     Author: Rickey Barbara, MD 01/15/2023 5:54 PM  For on call review [email or web address].

## 2023-01-15 NOTE — Progress Notes (Signed)
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   Thrombocytopenia (HCC) 06/2018   Past Surgical History:  Procedure Laterality Date   BIOPSY  12/21/2022   Procedure: BIOPSY;  Surgeon: Tomma Lightning, MD;  Location: [facility];  Service: Endoscopy;;   BRONCHIAL WASHINGS  12/21/2022   Procedure: BRONCHIAL WASHINGS;  Surgeon: Tomma Lightning, MD;  Location: [facility];  Service: Endoscopy;;   ENDOBRONCHIAL ULTRASOUND Left 12/21/2022   Procedure: ENDOBRONCHIAL ULTRASOUND;  Surgeon: Tomma Lightning, MD;  Location: [facility];  Service: Endoscopy;  Laterality: Left;  Endobronchial ultrasound, biopsy, need fluoroscopy   FINE NEEDLE ASPIRATION  12/21/2022   Procedure: FINE NEEDLE ASPIRATION (FNA) LINEAR;  Surgeon: Tomma Lightning, MD;  Location: [facility];  Service: Endoscopy;;   IR BONE MARROW BIOPSY & ASPIRATION  12/27/2022   IR IMAGING GUIDED PORT INSERTION  12/27/2022   IR LUMBAR PUNCTURE  12/22/2022   LUMBAR PUNCTURE  06/2018   during eval for meningitis    VIDEO BRONCHOSCOPY Left 12/21/2022   Procedure: VIDEO BRONCHOSCOPY WITH FLUORO, endobronchial ultrasound;  Surgeon: Tomma Lightning, MD;  Location: [facility];  Service: Endoscopy;  Laterality: Left;  Schedule for 12/20/2022 12/21/2022       Allergies: Patient has no known allergies.  Medications: Prior to Admission medications   Medication Sig Start Date End Date Taking? Authorizing Provider  amLODipine (NORVASC) 5 MG tablet Take 1 tablet (5 mg total) by mouth daily. Patient not taking: Reported on 01/10/2023 01/09/23   Tysinger, Kermit Balo, PA-C  benzonatate (TESSALON) 100 MG capsule Take 1 capsule (100 mg total) by mouth 3 (three) times daily as needed for cough. Patient not taking: Reported on 01/10/2023 01/03/23   Rhetta Mura, MD  bictegravir-emtricitabine-tenofovir AF (BIKTARVY) 50-200-25 MG TABS tablet Take 1 tablet by mouth daily. Patient not taking: Reported on 01/13/2023 01/02/23   Daiva Eves, Lisette Grinder, MD  dronabinol (MARINOL) 5 MG capsule Take 1 capsule (5 mg total) by mouth 2 (two) times daily before a meal. Patient not taking: Reported on 01/13/2023 01/10/23   Josph Macho, MD  fluconazole (DIFLUCAN) 200 MG tablet Take 1 tablet (200 mg total) by mouth daily for 30 doses. Patient not taking: Reported on 01/13/2023 01/02/23 02/01/23  Daiva Eves, Lisette Grinder, MD  HYDROmorphone (DILAUDID) 4 MG tablet Take 1 tablet (4 mg total) by mouth every 6 (six) hours as needed for severe pain. Patient not taking: Reported on 01/13/2023 01/10/23   Josph Macho, MD  senna-docusate (SENOKOT-S) 8.6-50 MG tablet Take 1 tablet by mouth 2 (two) times daily between meals as needed for mild constipation. Patient not taking: Reported on 01/09/2023 01/03/23   Rhetta Mura, MD  sulfamethoxazole-trimethoprim (BACTRIM DS) 800-160 MG tablet Take 1 tablet by mouth daily. Patient not taking: Reported on 01/13/2023 01/02/23 02/01/23  Daiva Eves, Lisette Grinder, MD     Vital  Signs: BP (!) 135/108 (BP Location: Right Arm)   Pulse 91   Temp 98.4 F (36.9 C) (Oral)   Resp 16   Ht 6' (1.829 m)   Wt 168 lb (76.2 kg)   SpO2 97%   BMI 22.78 kg/m   Physical Exam; awake/alert; chest- dim BS left base, right clear; intact rt chest port a  cath; heart-RRR; abd-soft,+BS,mildly tender LUQ;no LE edema  Imaging: CT Angio Chest Pulmonary Embolism (PE) W or WO Contrast  Result Date: 01/14/2023 CLINICAL DATA:  High probability pulmonary embolism, chest pain, dyspnea. Small cell lung cancer. EXAM: CT ANGIOGRAPHY CHEST WITH CONTRAST TECHNIQUE: Multidetector CT imaging of the chest was performed using the standard protocol during bolus administration of intravenous contrast. Multiplanar CT image reconstructions and MIPs were obtained to evaluate the vascular anatomy. RADIATION DOSE REDUCTION: This exam was performed according to the departmental dose-optimization program which includes automated exposure control, adjustment of the mA and/or kV according to patient size and/or use of iterative reconstruction technique. CONTRAST:  75mL OMNIPAQUE IOHEXOL 350 MG/ML SOLN COMPARISON:  None Available. FINDINGS: Cardiovascular: There is adequate opacification of the pulmonary arterial tree. No intraluminal filling defect identified to suggest acute pulmonary embolism. Anterior mediastinal mass encases and narrows the left upper lobar pulmonary arterial branches and demonstrates extrinsic compression and narrowing of the left lower lobar pulmonary artery centrally. Mild coronary artery calcification. Global cardiac size within normal limits. Mediastinal shift to the right noted, progressive since prior examination. Stable small pericardial effusion stable anterior mediastinal/anterior pericardial mass in keeping with the patient's known mediastinal nodal disease. The thoracic aorta is unremarkable. Right internal jugular chest port tip seen within the superior right atrium. Mediastinum/Nodes: 8.6 x 10.7 x 10.5 cm anterior mediastinal/anterior pericardial mass appears unchanged likely representing a a mediastinal nodal conglomerate in this patient with small cell lung cancer. This appears confluent with pathologic left hilar adenopathy. No additional pathologic  thoracic adenopathy. Visualized thyroid is unremarkable. Esophagus is unremarkable. Lungs/Pleura: Large left pleural effusion with complete collapse of the left lower lobe and compressive atelectasis of the left upper lobe is again seen with enlargement of the pleural fluid component increasing atelectasis of the left upper lobe. Right lung is clear. No pneumothorax. No pleural effusion on the right. No central obstructing lesion. Upper Abdomen: Pathologic left pararenal adenopathy is present. No acute abnormality. Musculoskeletal: No acute bone abnormality. No lytic or blastic bone lesion. Review of the MIP images confirms the above findings. IMPRESSION: 1. No pulmonary embolism. 2. Stable anterior mediastinal/anterior pericardial mass in keeping with the patient's known mediastinal nodal disease in this patient with small cell lung cancer. 3. Enlarging, large left pleural effusion with progressive compressive atelectasis of the left lung and increasing mediastinal shift to the right. 4. Mild coronary artery calcification. Electronically Signed   By: Helyn Numbers M.D.   On: 01/14/2023 01:38   DG Chest Port 1 View  Result Date: 01/13/2023 CLINICAL DATA:  Chest pain and shortness of breath EXAM: PORTABLE CHEST 1 VIEW COMPARISON:  Radiographs 01/10/2023 FINDINGS: Stable large left paramediastinal mass. Right IJ CVC tip in the low SVC. The right lung is clear. Slightly increased moderate-to-large left pleural effusion and associated airspace opacities. No pneumothorax. No displaced rib fractures. IMPRESSION: Slightly increased moderate-to-large left pleural effusion and associated airspace opacities. Redemonstrated large left paramediastinal mass. Electronically Signed   By: Minerva Fester M.D.   On: 01/13/2023 23:09    Labs:  CBC: Recent Labs    01/10/23 1110 01/13/23 2220 01/14/23  8469 01/15/23 0523  WBC 2.0* 3.8* 3.8* 4.6  HGB 14.3 14.6 13.3 14.1  HCT 39.7 41.6 38.8* 41.1  PLT 273 315 297 302     COAGS: Recent Labs    12/18/22 0226 01/15/23 0523  INR 1.0 1.0  APTT 24  --     BMP: Recent Labs    01/10/23 1110 01/13/23 2220 01/14/23 0657 01/15/23 0523  NA 142 140 144 139  K 4.0 3.8 3.1* 4.5  CL 107 106 114* 104  CO2 28 22 21* 23  GLUCOSE 104* 129* 87 110*  BUN 8 9 7 9   CALCIUM 9.1 8.5* 6.9* 9.1  CREATININE 0.88 1.36* 0.78 1.05  GFRNONAA >60 >60 >60 >60    LIVER FUNCTION TESTS: Recent Labs    01/01/23 0625 01/02/23 0500 01/03/23 0459 01/10/23 1110 01/15/23 0523  BILITOT 1.3* 0.9  --  0.3 0.5  AST 32 29  --  15 21  ALT 67* 68*  --  32 29  ALKPHOS 95 94  --  106 92  PROT 7.2 6.8  --  6.7 6.8  ALBUMIN 3.4* 3.3* 3.1* 3.8 3.4*    Assessment and Plan: 42 y.o. 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 Grace Isaac. Risks and benefits discussed with the patient including bleeding, infection, damage to adjacent structures, pneumothorax and sepsis.  All of the patient's questions were answered, patient is agreeable to proceed. Consent signed and in chart.    Electronically Signed: D. Jeananne Rama, PA-C 01/15/2023, 10:44 AM   I spent a total of  25 minutes at the the patient's bedside AND on the patient's hospital floor or unit, greater than 50% of which was counseling/coordinating care for image guided left pleurx cath placement    Patient ID: Christopher Henson, male   DOB: 06-Apr-1981, 42 y.o.   MRN: [id number]

## 2023-01-15 NOTE — Progress Notes (Signed)
[name] will have a thoracentesis today.  He will have the Pleurx catheter placed.  His labs look okay.  White cell count is 4.6.  Hemoglobin 14.1.  Platelet count 302,000.  Sodium 139.  Potassium 4.5.  BUN 9 creatinine 1.05.  Calcium 9.1.  Albumin 3.4.  Clearly, we are had to make a change in his chemotherapy.  I would think that if chemotherapy is working, we would not be have the pleural if lesion come back.  I may have to treat him with a treatment regimen more focused on large cell neuroendocrine carcinoma.  Again, may have to treat him as an outpatient so we can do both chemotherapy and immunotherapy at the same time.   His vital signs are all stable.  Temperature 98.  Pulse 96.  Blood pressure 131/104.  His lungs show good breath sounds on the right side.  He has decreased on the left side.  Cardiac exam regular rate and rhythm.  Abdomen is soft.  He has decent bowel sounds.  Extremity shows no clubbing, cyanosis or edema.  Neurological exam is nonfocal.  Again, we are had to make a change to his protocol.  We will have to see about chemoimmunotherapy for large cell neuroendocrine carcinoma.  Hopefully, we can treat him as an outpatient.  Again I really think that he would benefit from immunotherapy which we cannot give as an inpatient.  Christin Bach, MD  Psalm 6:2

## 2023-01-15 NOTE — Procedures (Signed)
Pre procedural Dx: Recurrent left sided pleural effusion Post procedural Dx: Same  Successful image guided placement of a tunneled left sided pleural drainage catheter. 1.5 L of blood tinged fluid aspirated following drain placement.  EBL: Trace Complications: None immediate  Katherina Right, MD Pager #: [phone]

## 2023-01-15 NOTE — Plan of Care (Signed)
Problem: Education: Goal: Knowledge of General Education information will improve Description: Including pain rating scale, medication(s)/side effects and non-pharmacologic comfort measures 01/15/2023 0729 by Annell Greening, RN Outcome: Progressing 01/15/2023 0729 by Annell Greening, RN Outcome: Progressing   Problem: Education: Goal: Knowledge of General Education information will improve Description: Including pain rating scale, medication(s)/side effects and non-pharmacologic comfort measures 01/15/2023 0729 by Annell Greening, RN Outcome: Progressing 01/15/2023 0729 by Annell Greening, RN Outcome: Progressing   Problem: Health Behavior/Discharge Planning: Goal: Ability to manage health-related needs will improve 01/15/2023 0729 by Annell Greening, RN Outcome: Progressing 01/15/2023 0729 by Annell Greening, RN Outcome: Progressing   Problem: Health Behavior/Discharge Planning: Goal: Ability to manage health-related needs will improve 01/15/2023 0729 by Annell Greening, RN Outcome: Progressing 01/15/2023 0729 by Annell Greening, RN Outcome: Progressing   Problem: Clinical Measurements: Goal: Ability to maintain clinical measurements within normal limits will improve 01/15/2023 0729 by Annell Greening, RN Outcome: Progressing 01/15/2023 0729 by Annell Greening, RN Outcome: Progressing Goal: Will remain free from infection 01/15/2023 0729 by Annell Greening, RN Outcome: Progressing 01/15/2023 0729 by Annell Greening, RN Outcome: Progressing Goal: Diagnostic test results will improve 01/15/2023 0729 by Annell Greening, RN Outcome: Progressing 01/15/2023 0729 by Annell Greening, RN Outcome: Progressing Goal: Respiratory complications will improve 01/15/2023 0729 by Annell Greening, RN Outcome: Progressing 01/15/2023 0729 by Annell Greening, RN Outcome: Progressing Goal: Cardiovascular complication will be avoided Outcome: Progressing   Problem: Clinical Measurements: Goal: Ability to maintain clinical measurements within normal limits will improve 01/15/2023 0729 by Annell Greening, RN Outcome: Progressing 01/15/2023 0729 by Annell Greening, RN Outcome: Progressing   Problem: Clinical Measurements: Goal: Will remain free from infection 01/15/2023 0729 by Annell Greening, RN Outcome: Progressing 01/15/2023 0729 by Annell Greening, RN Outcome: Progressing   Problem: Clinical Measurements: Goal: Diagnostic test results will improve 01/15/2023 0729 by Annell Greening, RN Outcome: Progressing 01/15/2023 0729 by Annell Greening, RN Outcome: Progressing   Problem: Clinical Measurements: Goal: Respiratory complications will improve 01/15/2023 0729 by Annell Greening, RN Outcome: Progressing 01/15/2023 0729 by Annell Greening, RN Outcome: Progressing   Problem: Clinical Measurements: Goal: Cardiovascular complication will be avoided Outcome: Progressing   Problem: Activity: Goal: Risk for activity intolerance will decrease 01/15/2023 0729 by Annell Greening, RN Outcome: Progressing 01/15/2023 0729 by Annell Greening, RN Outcome: Progressing   Problem: Activity: Goal: Risk for activity intolerance will decrease 01/15/2023 0729 by Annell Greening, RN Outcome: Progressing 01/15/2023 0729 by Annell Greening, RN Outcome: Progressing   Problem: Nutrition: Goal: Adequate nutrition will be maintained 01/15/2023 0729 by Annell Greening, RN Outcome: Progressing 01/15/2023 0729 by Annell Greening, RN Outcome: Progressing   Problem: Nutrition: Goal: Adequate nutrition will be maintained 01/15/2023 0729 by Annell Greening, RN Outcome: Progressing 01/15/2023  0729 by Annell Greening, RN Outcome: Progressing   Problem: Coping: Goal: Level of anxiety will decrease Outcome: Progressing   Problem: Coping: Goal: Level of anxiety will decrease Outcome: Progressing   Problem: Elimination: Goal: Will not experience complications related to bowel motility 01/15/2023 0729 by Annell Greening, RN Outcome: Progressing 01/15/2023 0729 by Annell Greening, RN Outcome: Progressing Goal: Will not experience complications related to urinary retention Outcome: Progressing   Problem: Elimination: Goal: Will not experience complications related to bowel motility 01/15/2023 0729 by Annell Greening, RN Outcome: Progressing 01/15/2023 0729 by [employer], Solaris Kram,  RN Outcome: Progressing   Problem: Elimination: Goal: Will not experience complications related to urinary retention Outcome: Progressing   Problem: Pain Managment: Goal: General experience of comfort will improve 01/15/2023 0729 by Annell Greening, RN Outcome: Progressing 01/15/2023 0729 by Annell Greening, RN Outcome: Progressing   Problem: Pain Managment: Goal: General experience of comfort will improve 01/15/2023 0729 by Annell Greening, RN Outcome: Progressing 01/15/2023 0729 by Annell Greening, RN Outcome: Progressing   Problem: Safety: Goal: Ability to remain free from injury will improve 01/15/2023 0729 by Annell Greening, RN Outcome: Progressing 01/15/2023 0729 by Annell Greening, RN Outcome: Progressing   Problem: Safety: Goal: Ability to remain free from injury will improve 01/15/2023 0729 by Annell Greening, RN Outcome: Progressing 01/15/2023 0729 by Annell Greening, RN Outcome: Progressing   Problem: Skin Integrity: Goal: Risk for impaired skin integrity will decrease 01/15/2023 0729 by Annell Greening, RN Outcome: Progressing 01/15/2023 0729  by Annell Greening, RN Outcome: Progressing   Problem: Skin Integrity: Goal: Risk for impaired skin integrity will decrease 01/15/2023 0729 by Annell Greening, RN Outcome: Progressing 01/15/2023 0729 by Annell Greening, RN Outcome: Progressing

## 2023-01-16 ENCOUNTER — Ambulatory Visit (HOSPITAL_COMMUNITY): Payer: Medicaid Other

## 2023-01-16 ENCOUNTER — Ambulatory Visit: Payer: 59

## 2023-01-16 DIAGNOSIS — R079 Chest pain, unspecified: Secondary | ICD-10-CM | POA: Diagnosis not present

## 2023-01-16 DIAGNOSIS — J9859 Other diseases of mediastinum, not elsewhere classified: Secondary | ICD-10-CM | POA: Diagnosis not present

## 2023-01-16 DIAGNOSIS — J910 Malignant pleural effusion: Secondary | ICD-10-CM | POA: Diagnosis not present

## 2023-01-16 LAB — CBC WITH DIFFERENTIAL/PLATELET
Abs Immature Granulocytes: 0.11 10*3/uL — ABNORMAL HIGH (ref 0.00–0.07)
Basophils Absolute: 0 10*3/uL (ref 0.0–0.1)
Basophils Relative: 1 %
Eosinophils Absolute: 0 10*3/uL (ref 0.0–0.5)
Eosinophils Relative: 0 %
HCT: 41.9 % (ref 39.0–52.0)
Hemoglobin: 14.6 g/dL (ref 13.0–17.0)
Immature Granulocytes: 2 %
Lymphocytes Relative: 18 %
Lymphs Abs: 1.3 10*3/uL (ref 0.7–4.0)
MCH: 31.3 pg (ref 26.0–34.0)
MCHC: 34.8 g/dL (ref 30.0–36.0)
MCV: 89.9 fL (ref 80.0–100.0)
Monocytes Absolute: 1.1 10*3/uL — ABNORMAL HIGH (ref 0.1–1.0)
Monocytes Relative: 15 %
Neutro Abs: 4.6 10*3/uL (ref 1.7–7.7)
Neutrophils Relative %: 64 %
Platelets: 291 10*3/uL (ref 150–400)
RBC: 4.66 MIL/uL (ref 4.22–5.81)
RDW: 13.3 % (ref 11.5–15.5)
WBC: 7.2 10*3/uL (ref 4.0–10.5)
nRBC: 0 % (ref 0.0–0.2)

## 2023-01-16 LAB — COMPREHENSIVE METABOLIC PANEL
ALT: 25 U/L (ref 0–44)
AST: 24 U/L (ref 15–41)
Albumin: 3.3 g/dL — ABNORMAL LOW (ref 3.5–5.0)
Alkaline Phosphatase: 93 U/L (ref 38–126)
Anion gap: 15 (ref 5–15)
BUN: 14 mg/dL (ref 6–20)
CO2: 23 mmol/L (ref 22–32)
Calcium: 9.3 mg/dL (ref 8.9–10.3)
Chloride: 99 mmol/L (ref 98–111)
Creatinine, Ser: 1.19 mg/dL (ref 0.61–1.24)
GFR, Estimated: >60 mL/min (ref >60)
Glucose, Bld: 152 mg/dL — ABNORMAL HIGH (ref 70–99)
Potassium: 4.2 mmol/L (ref 3.5–5.1)
Sodium: 137 mmol/L (ref 135–145)
Total Bilirubin: 0.5 mg/dL (ref 0.3–1.2)
Total Protein: 6.7 g/dL (ref 6.5–8.1)

## 2023-01-16 MED ORDER — ACETAMINOPHEN 325 MG PO TABS
650.0000 mg | ORAL_TABLET | Freq: Three times a day (TID) | ORAL | Status: DC
  Administered 2023-01-16 – 2023-01-17 (×2): 650 mg via ORAL
  Filled 2023-01-16 (×3): qty 2

## 2023-01-16 MED ORDER — LABETALOL HCL 5 MG/ML IV SOLN: 5.0000 mg | INTRAVENOUS | Status: DC | PRN

## 2023-01-16 NOTE — Progress Notes (Signed)
Progress Note   Patient: Christopher Henson JOA:[id number] DOB: January 08, 1981 DOA: 01/13/2023     1 DOS: the patient was seen and examined on 01/16/2023   Brief hospital course: 42 y.o. 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    Patient reports developing chest discomfort and shortness of breath yesterday that feels nearly identical to the symptoms that prompted his presentation to the hospital 1 month ago.  Chest pain radiates to the shoulder blade and is worse with cough or deep breath.  He has not been coughing much and has been nonproductive.  He denies fevers or chills but has had sweats since the recent discharge.  Upon arrival to the ED, patient is found to be afebrile and saturating well on 2 L/min of supplemental oxygen with tachypnea, mild tachycardia, and stable blood pressure.  Labs are notable for creatinine 1.36 and troponin normal x 2.  CTA chest is most notable for enlarging large left pleural effusion.   Assessment and Plan: 1. Recurrent left pleural effusion; malignant  - IR consulted and pt is now s/p Pleurx placement 10/7 -Appreciate input by Dr. [name]. Plans for outpt chemo and immunotherapy -Cont with O2 as needed and analgesia as needed. Have scheduled tylenol in addition to current narcotics. Pt appeared uncomfortable in chair this AM -Per Oncology, plan to monitor today and likely d/c home tomorrow   2. AKI - renal function improved with hydration     3.  Small cell/neuroendocrine carcinoma of left lung  - Diagnosed during admission last month  - Completed 1 cycle of carboplatinum/etoposide/Tecentriq under the care of Dr. [name] -Per Oncology, plan for immunotherapy with chemo   4. HIV; hx cryptococcal meningitis  - CD4 was 356 and VL  undetectable in September 2024  - Continue Biktarvy, fluconazole     Subjective: Sitting in chair this AM. Complains of continued L side pain  Physical Exam: Vitals:   01/16/23 0241 01/16/23 0630 01/16/23 1050 01/16/23 1343  BP: (!) 139/94 (!) 127/90 (!) 123/99 (!) 141/108  Pulse: (!) 114 (!) 109 (!) 107 (!) 116  Resp: 14     Temp: 98.8 F (37.1 C) 98.7 F (37.1 C) 97.7 F (36.5 C) 98.2 F (36.8 C)  TempSrc: Oral Oral Oral Oral  SpO2: 98% 93% 91% 96%  Weight:      Height:       General exam: Awake, laying in bed, in nad Respiratory system: Normal respiratory effort, no wheezing Cardiovascular system: regular rate, s1, s2 Gastrointestinal system: Soft, nondistended, positive BS Central nervous system: CN2-12 grossly intact, strength intact Extremities: Perfused, no clubbing Skin: Normal skin turgor, no notable skin lesions seen Psychiatry: Mood normal // no visual hallucinations   Data Reviewed:  Labs reviewed: Na 137, K 4.2, Cr 1.19, WBC 7.2  Family Communication: Pt in room, family not at bedside  Disposition: Status is: Inpatient Continue inpatient stay because: severity of illness  Planned Discharge Destination: Home     Author: Rickey Barbara, MD 01/16/2023 5:23 PM  For on call review [email or web address].

## 2023-01-16 NOTE — Progress Notes (Signed)
Christopher Henson had the thoracentesis yesterday.  He had a Pleurx catheter placed.  1.5 L of fluid was removed.  He does feel better.  He has a little bit of pain on the left side where he has a catheter.  We are going to have to treat him as an outpatient for his lung cancer.  I think that it would just be best if we treat him as an outpatient as we can give him the immunotherapy along with the chemotherapy.  Again, we will have to utilize a protocol for non-small cell lung cancer.  This malignancy clearly is behaving more like an non-small cell lung cancer than a small cell lung cancer.  His labs today show sodium 137.  Potassium 4.2.  BUN 14 creatinine 1.19.  Calcium 9.3 with an albumin of 3.3.  LFTs are normal.  White cell count 7.2.  Hemoglobin 14.6.  Platelet count 291,000.  He is eating okay.  He has had no issues with nausea or vomiting.  He seems to be doing okay with pain.  He now is on the Duragesic patch.  Again, if I would be wise just to watch him today and then let him go home tomorrow.  On his exam, his temperature is 98.7.  Pulse 109.  Blood pressure 127/90.  His lungs still show some decrease over on the left side.  Right side to breath sounds are clear.  He has no wheezing.  Cardiac exam tachycardic but regular.  Has no murmurs.  Abdomen is soft.  Bowel sounds are present.  Extremity shows no clubbing, cyanosis or edema.  Neurological exam shows no focal neurological deficits.   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  He does have underlying HIV which is well-controlled.  He has recurrent pleural effusion.  He did have a thoracentesis and a Pleurx catheter placed yesterday.  Again, we will treat him as an outpatient.  Hopefully, we will be able to get treatment in this week.  If not, we will get this set up for early next week.  I do appreciate the outstanding care that he has gotten from the staff up on [address].  Christin Bach, MD  [name] 41:10

## 2023-01-16 NOTE — TOC Initial Note (Addendum)
Transition of Care [facility]) - Initial/Assessment Note    Patient Details  Name: Christopher Henson MRN: [id number] Date of Birth: 1981/02/04  Transition of Care [facility]) CM/SW Contact:    Darleene Cleaver, LCSW Phone Number: 01/16/2023, 5:51 PM  Clinical Narrative:                  Patient is a 42 year old male who is alert and oriented x4.  Patient was provided oxygen by [employer] during his last hospitalization.  Patient plant to discharge to a relative's house once he is ready for discharge.  Last hospitalization patient was provided housing resources, and resources for [employer], thrift stores to get a bed.  Per last hospitalization conversation patient was going to apply for disability and also work on trying to find somewhere else to live.  Patient stated he did not want to go to a homeless shelter.  Patient was provided resource for the food pantry to help with food.  Patient may need to go home with a plurx drain.  SDOH screen completed resources for housing added to AVS.  TOC to continue to follow patient's progress throughout discharge planning.  Expected Discharge Plan: Home w Home Health Services Barriers to Discharge: Continued Medical Work up   Patient Goals and CMS Choice Patient states their goals for this hospitalization and ongoing recovery are:: To return to his sister's house.          Expected Discharge Plan and Services In-house Referral: Clinical Social Work   Post Acute Care Choice: Durable Medical Equipment Living arrangements for the past 2 months: Single Family Home                 DME Arranged: Oxygen DME Agency: [employer]                  Prior Living Arrangements/Services Living arrangements for the past 2 months: Single Family Home Lives with:: Siblings Patient language and need for interpreter reviewed:: Yes Do you feel safe going back to the place where you live?: Yes      Need for Family Participation in Patient Care: No  (Comment) Care giver support system in place?: No (comment) Current home services: DME Criminal Activity/Legal Involvement Pertinent to Current Situation/Hospitalization: No - Comment as needed  Activities of Daily Living   ADL Screening (condition at time of admission) Independently performs ADLs?: Yes (appropriate for developmental age) Is the patient deaf or have difficulty hearing?: No Does the patient have difficulty seeing, even when wearing glasses/contacts?: No Does the patient have difficulty concentrating, remembering, or making decisions?: No  Permission Sought/Granted Permission sought to share information with : Case Manager, Family Supports Permission granted to share information with : Yes, Verbal Permission Granted  Share Information with NAMEMatthias Hughs Sister   [phone]  Charm Barges [phone]  [phone]  Permission granted to share info w AGENCY: Home health agencies        Emotional Assessment Appearance:: Appears stated age   Affect (typically observed): Accepting, Appropriate Orientation: : Oriented to Self, Oriented to Place, Oriented to Situation, Oriented to  Time Alcohol / Substance Use: Alcohol Use Psych Involvement: No (comment)  Admission diagnosis:  Pleural effusion [J90] Pleural effusion, malignant [J91.0] Chest pain, unspecified type [R07.9] Pleural effusion, left [J90] Patient Active Problem List   Diagnosis Date Noted   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   Pleural effusion, left 01/15/2023   Pleural effusion, 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   AKI (acute kidney injury) (  HCC) 01/14/2023   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   History of homeless 01/09/2023   On home oxygen therapy 01/09/2023   Essential hypertension, benign 01/09/2023   IFG (impaired fasting glucose) 01/09/2023   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   Mediastinal mass 12/18/2022   Pleural effusion on left 12/18/2022   Hypokalemia  12/18/2022   Alcoholism (HCC) 12/18/2022   Smoker 12/18/2022   Routine screening for STI (sexually transmitted infection) 06/08/2022   Encounter for long-term (current) use of high-risk medication 06/08/2022   Dental abscess 11/28/2019   Need for hepatitis A and B vaccination 02/25/2019   Thrombocytopenia (HCC) 02/25/2019   Elevated liver enzymes 02/25/2019   Influenza vaccination declined 02/25/2019   Alcohol use 02/21/2019   Depression 12/10/2018   Poor dentition 10/03/2018   HIV (human immunodeficiency virus infection) (HCC) 09/05/2018   Protein-calorie malnutrition, severe 06/10/2018   History of cryptococcal meningitis    PCP:  [name], PA-C Pharmacy:   [id number] - [place], [place] - [address] AT [facility] OF [address] & [address] [place] [address] Phone: [phone] Fax: [phone]  [facility] [address] [place] [address] Phone: [phone] Fax: [phone]     Social Determinants of Health (SDOH) Social History: SDOH Screenings   Food Insecurity: No Food Insecurity (01/14/2023)  Housing: High Risk (01/16/2023)  Transportation Needs: No Transportation Needs (01/14/2023)  Utilities: Not At Risk (01/14/2023)  Alcohol Screen: High Risk (01/10/2023)  Depression (PHQ2-9): Low Risk  (06/08/2022)  Financial Resource Strain: Medium Risk (01/16/2023)  Physical Activity: Inactive (01/10/2023)  Social Connections: Socially Isolated (01/10/2023)  Stress: Stress Concern Present (01/10/2023)  Tobacco Use: Medium Risk (01/14/2023)  Health Literacy: Adequate Health Literacy (01/10/2023)   SDOH Interventions: Housing Interventions: Inpatient TOC Financial Strain Interventions: Inpatient TOC   Readmission Risk Interventions    01/02/2023    4:06 PM  Readmission Risk Prevention Plan  Transportation Screening Complete  PCP or Specialist Appt within 3-5 Days Complete  HRI  or Home Care Consult Complete  Social Work Consult for Recovery Care Planning/Counseling Complete  Palliative Care Screening Complete  Medication Review Oceanographer) Complete

## 2023-01-16 NOTE — Plan of Care (Signed)

## 2023-01-16 NOTE — Progress Notes (Signed)
Pt given supplies for Pleurx drainage system. Pt educated on how to cleanse and change dressings with teach back method. Patient verbalized and demonstrated understanding. Dressing cleaned and changed, no redness, or drainage around catheter, steri strips in place.

## 2023-01-17 ENCOUNTER — Inpatient Hospital Stay: Payer: 59 | Admitting: Licensed Clinical Social Worker

## 2023-01-17 ENCOUNTER — Inpatient Hospital Stay: Payer: 59

## 2023-01-17 ENCOUNTER — Other Ambulatory Visit: Payer: 59 | Admitting: Licensed Clinical Social Worker

## 2023-01-17 DIAGNOSIS — C3492 Malignant neoplasm of unspecified part of left bronchus or lung: Secondary | ICD-10-CM

## 2023-01-17 DIAGNOSIS — J910 Malignant pleural effusion: Secondary | ICD-10-CM | POA: Diagnosis not present

## 2023-01-17 LAB — COMPREHENSIVE METABOLIC PANEL
ALT: 28 U/L (ref 0–44)
AST: 26 U/L (ref 15–41)
Albumin: 3.3 g/dL — ABNORMAL LOW (ref 3.5–5.0)
Alkaline Phosphatase: 91 U/L (ref 38–126)
Anion gap: 11 (ref 5–15)
BUN: 12 mg/dL (ref 6–20)
CO2: 22 mmol/L (ref 22–32)
Calcium: 9 mg/dL (ref 8.9–10.3)
Chloride: 102 mmol/L (ref 98–111)
Creatinine, Ser: 1.11 mg/dL (ref 0.61–1.24)
GFR, Estimated: >60 mL/min (ref >60)
Glucose, Bld: 177 mg/dL — ABNORMAL HIGH (ref 70–99)
Potassium: 4.5 mmol/L (ref 3.5–5.1)
Sodium: 135 mmol/L (ref 135–145)
Total Bilirubin: 0.3 mg/dL (ref 0.3–1.2)
Total Protein: 6.9 g/dL (ref 6.5–8.1)

## 2023-01-17 LAB — CBC WITH DIFFERENTIAL/PLATELET
Abs Immature Granulocytes: 0.22 10*3/uL — ABNORMAL HIGH (ref 0.00–0.07)
Basophils Absolute: 0.1 10*3/uL (ref 0.0–0.1)
Basophils Relative: 1 %
Eosinophils Absolute: 0.1 10*3/uL (ref 0.0–0.5)
Eosinophils Relative: 1 %
HCT: 40.6 % (ref 39.0–52.0)
Hemoglobin: 14.3 g/dL (ref 13.0–17.0)
Immature Granulocytes: 3 %
Lymphocytes Relative: 21 %
Lymphs Abs: 1.8 10*3/uL (ref 0.7–4.0)
MCH: 31.7 pg (ref 26.0–34.0)
MCHC: 35.2 g/dL (ref 30.0–36.0)
MCV: 90 fL (ref 80.0–100.0)
Monocytes Absolute: 0.9 10*3/uL (ref 0.1–1.0)
Monocytes Relative: 11 %
Neutro Abs: 5.6 10*3/uL (ref 1.7–7.7)
Neutrophils Relative %: 63 %
Platelets: 274 10*3/uL (ref 150–400)
RBC: 4.51 MIL/uL (ref 4.22–5.81)
RDW: 13.2 % (ref 11.5–15.5)
WBC: 8.8 10*3/uL (ref 4.0–10.5)
nRBC: 0 % (ref 0.0–0.2)

## 2023-01-17 MED ORDER — FENTANYL 25 MCG/HR TD PT72: 1.0000 | MEDICATED_PATCH | TRANSDERMAL | 0 refills | Status: DC

## 2023-01-17 MED ORDER — ACETAMINOPHEN 325 MG PO TABS: 650.0000 mg | ORAL_TABLET | Freq: Four times a day (QID) | ORAL | Status: DC | PRN

## 2023-01-17 MED ORDER — OXYCODONE HCL 5 MG PO TABS: 5.0000 mg | ORAL_TABLET | ORAL | 0 refills | Status: DC | PRN

## 2023-01-17 NOTE — Plan of Care (Signed)
Pt medicated per orders for pain throughout the shift. Dressing to plurex drain remains clean, dry, and intact. Pt is independent in room, call light within reach.   Problem: Education: Goal: Knowledge of General Education information will improve Description: Including pain rating scale, medication(s)/side effects and non-pharmacologic comfort measures Outcome: Progressing   Problem: Health Behavior/Discharge Planning: Goal: Ability to manage health-related needs will improve Outcome: Progressing   Problem: Clinical Measurements: Goal: Ability to maintain clinical measurements within normal limits will improve Outcome: Progressing

## 2023-01-17 NOTE — TOC Transition Note (Signed)
Transition of Care [facility]) - CM/SW Discharge Note  Patient Details  Name: Christopher Henson MRN: [id number] Date of Birth: [birth year]  [facility]) CM/SW Contact:  Ewing Schlein, LCSW Phone Number: 01/17/2023, 11:56 AM  Clinical Narrative: Patient will discharge to his sister's home ([address], [place], [place] [address]) with the PleurX drain. CSW made [facility] referrals to the following agencies:  [facility]: declined [facility]: declined [facility]: declined, does not accept PleurX [facility]: declined [facility]: no response [facility]: out-of-network, does not accept PleurX [facility]: out-of-network [facility]: declined, no nursing availability [facility]: out-of-network  Patient has been taught how to manage the canisters at home. Box of 10 canisters delivered to patient's room by materials to take home. PleurX order form has been completed and faxed to [facility] [phone]) and confirmation was received that it went through. TOC signing off.  Final next level of care: Home/Self Care Barriers to Discharge: No Home Care Agency will accept this patient  Patient Goals and CMS Choice Choice offered to / list presented to : Patient  Discharge Plan and Services Additional resources added to the After Visit Summary for   In-house Referral: Clinical Social Work Post Acute Care Choice: Durable Medical Equipment          DME Arranged: Other see comment (PleurX canisters/drainage kits) DME Agency: Other - Comment Careers adviser Medical [employer].)  Social Determinants of Health (SDOH) Interventions SDOH Screenings   Food Insecurity: No Food Insecurity (01/14/2023)  Housing: High Risk (01/16/2023)  Transportation Needs: No Transportation Needs (01/14/2023)  Utilities: Not At Risk (01/14/2023)  Alcohol Screen: High Risk (01/10/2023)  Depression (PHQ2-9): Low Risk  (06/08/2022)  Financial Resource Strain: Medium Risk (01/16/2023)  Physical Activity: Inactive (01/10/2023)  Social  Connections: Socially Isolated (01/10/2023)  Stress: Stress Concern Present (01/10/2023)  Tobacco Use: Medium Risk (01/14/2023)  Health Literacy: Adequate Health Literacy (01/10/2023)   Readmission Risk Interventions    01/02/2023    4:06 PM  Readmission Risk Prevention Plan  Transportation Screening Complete  PCP or Specialist Appt within 3-5 Days Complete  HRI or Home Care Consult Complete  Social Work Consult for Recovery Care Planning/Counseling Complete  Palliative Care Screening Complete  Medication Review Oceanographer) Complete

## 2023-01-17 NOTE — Progress Notes (Signed)
[facility] CSW Progress Note  Clinical Child psychotherapist contacted patient by phone to further discuss his needs.  Referred him to the [employer] for housing assistance and food pantry.  He is hopeful that he will receive disability after his 10/23 phone interview.    Dorothey Baseman, LCSW Clinical Social Worker [facility]    Patient is participating in a Managed Medicaid Plan:  Yes

## 2023-01-17 NOTE — Progress Notes (Signed)
[facility] CSW Progress Note  Clinical Child psychotherapist returned patient's call.  He inquired about the grant.  CSW is currently working with supervisor regarding a decision since patient does have Medicaid.  CSW to initiate lung cancer grant for patient.  He stated he is stable since his hospitalization.  No other needs expressed.    Dorothey Baseman, LCSW Clinical Social Worker [facility]    Patient is participating in a Managed Medicaid Plan:  Yes

## 2023-01-17 NOTE — Discharge Summary (Signed)
Physician Discharge Summary  Christopher Henson ZOX:[id number] DOB: [birth year] DOA: 01/13/2023  PCP: [name], PA-C  Admit date: 01/13/2023 Discharge date: 01/17/2023  Admitted From: Home Disposition: Home  Recommendations for Outpatient Follow-up:  Follow up with PCP in 1-2 weeks Continue follow-up with oncology  Home Health: N/A Equipment/Devices: Pleurx catheter, drainage system  Discharge Condition: Stable CODE STATUS: Full code Diet recommendation: Regular diet, nutritional supplements  Discharge summary: 42 y.o. 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     Upon arrival to the ED, patient is found to be afebrile and saturating well on 2 L/min of supplemental oxygen with tachypnea, mild tachycardia, and stable blood pressure.  Labs are notable for creatinine 1.36 and troponin normal x 2.  CTA chest is most notable for enlarging large left pleural effusion.   Recurrent left pleural effusion: Patient with recurrent malignant pleural effusion.  He is on outpatient chemotherapy and immunotherapy.  Received tunneled Pleurx catheter on 10/7 with improvement of symptoms.  Learning to drain with history techniques at home.  Will drain 3 times in a week.  Pain management with fentanyl patch and oxycodone.  Limited supplies prescribed, further prescription to be done through cancer clinic.  Chronic medical issues including HIV and AIDS with history of cryptococcal meningitis: Stable on Biktarvy.  Also on Diflucan and Bactrim. 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  Outpatient oncology follow-up.   Medically stable for discharge.   Discharge Diagnoses:  Principal Problem:   Pleural effusion, malignant Active Problems:   HIV (human  immunodeficiency virus infection) (HCC)   Alcoholism (HCC)   Essential hypertension, benign   AKI (acute kidney injury) (HCC)   Large cell neuroendocrine carcinoma (HCC)   Pleural effusion, left    Discharge Instructions  Discharge Instructions     Ambulatory Pleural Drainage Schedule   Complete by: As directed    Drain daily, up to max of 1L until patient is only able to drain out . If <118ml for 3 consecutive drains every other day, then call Interventional Radiology [phone]) for evaluation and possible removal.   Change dressing (specify)   Complete by: As directed    Dressing change: keep dry and clean, covered with dry gauge.   Diet general   Complete by: As directed    Increase activity slowly   Complete by: As directed       Allergies as of 01/17/2023   No Known Allergies      Medication List     STOP taking these medications    amLODipine 5 MG tablet Commonly known as: NORVASC   benzonatate 100 MG capsule Commonly known as: TESSALON   HYDROmorphone 4 MG tablet Commonly known as: Dilaudid       TAKE these medications    acetaminophen 325 MG tablet Commonly known as: TYLENOL Take 2 tablets (650 mg total) by mouth every 6 (six) hours as needed.   Biktarvy 50-200-25 MG Tabs tablet Generic drug: bictegravir-emtricitabine-tenofovir AF Take 1 tablet by mouth daily.   dronabinol 5 MG capsule Commonly known as: MARINOL Take 1 capsule (5 mg total) by mouth 2 (two) times daily before a meal.   fentaNYL 25 MCG/HR Commonly known as: DURAGESIC Place 1 patch onto the skin every 3 (three) days for 6 days. Start taking on: January 18, 2023  fluconazole 200 MG tablet Commonly known as: DIFLUCAN Take 1 tablet (200 mg total) by mouth daily for 30 doses.   oxyCODONE 5 MG immediate release tablet Commonly known as: Oxy IR/ROXICODONE Take 1 tablet (5 mg total) by mouth every 4 (four) hours as needed for up to 5 days for moderate pain.    senna-docusate 8.6-50 MG tablet Commonly known as: Senokot-S Take 1 tablet by mouth 2 (two) times daily between meals as needed for mild constipation.   sulfamethoxazole-trimethoprim 800-160 MG tablet Commonly known as: BACTRIM DS Take 1 tablet by mouth daily.               Discharge Care Instructions  (From admission, onward)           Start     Ordered   01/17/23 0000  Change dressing (specify)       Comments: Dressing change: keep dry and clean, covered with dry gauge.   01/17/23 0929            No Known Allergies  Consultations: Oncology IR   Procedures/Studies: IR PERC PLEURAL DRAIN W/INDWELL CATH W/IMG GUIDE  Result Date: 01/15/2023 CLINICAL DATA:  History of metastatic small-cell lung cancer, now with recurrent symptomatic left-sided pleural effusion. Request made for placement of a tunneled PleurX catheter for palliative purposes. EXAM: INSERTION OF TUNNELED LEFT SIDED PLEURAL DRAINAGE CATHETER COMPARISON:  Ultrasound-guided thoracentesis-01/02/2023 (yielding 720 cc of bloody fluid); 12/29/2022 yielding 1 L of blood-tinged fluid) Chest CT-01/14/2023 MEDICATIONS: Ancef 2 gm IV; Antibiotic was administered in an appropriate time interval for the procedure. ANESTHESIA/SEDATION: Moderate (conscious) sedation was employed during this procedure. A total of Versed 2 mg and Fentanyl 100 mcg was administered intravenously. Moderate Sedation Time: 17 minutes. The patient's level of consciousness and vital signs were monitored continuously by radiology nursing throughout the procedure under my direct supervision. FLUOROSCOPY: 35 seconds (2 mGy) COMPLICATIONS: None immediate. PROCEDURE: The procedure, risks, benefits, and alternatives were explained to the patient, who wishes to proceed with the placement of this permanent pleural catheter as they are seeking palliative care. The patient understands and consents to the procedure. The left lateral chest and upper abdomen were  prepped and draped in a sterile fashion, and a sterile drape was applied covering the operative field. A sterile gown and sterile gloves were used for the procedure. Initial ultrasound scanning and fluoroscopic imaging demonstrates a recurrent moderate to large pleural effusion. Under direct ultrasound guidance, the inferior lateral pleural space was accessed with a Yueh sheath needle after the overlying soft tissues were anesthetized with 1% lidocaine with epinephrine. An Amplatz super stiff wire was then advanced under fluoroscopy into the pleural space. A 15.5 French tunneled Pleur-X catheter was tunneled from an incision within the left upper abdominal quadrant to the access site. The pleural access site was serially dilated under fluoroscopy, ultimately allowing placement of a peel-away sheath. The catheter was advanced through the peel-away sheath. The sheath was then removed. Final catheter positioning was confirmed with a fluoroscopic radiographic image. The access incision was closed with an interrupted subcutaneous subcuticular 4-0 Vicryl, Dermabond and Steri-Strips. A Prolene retention suture was applied at the catheter exit site. Large volume thoracentesis was performed through the new catheter utilizing provided bulb vacuum assisted drainage bag. Dressings were applied. The patient tolerated the above procedure well without immediate postprocedural complication. FINDINGS: Preprocedural ultrasound scanning demonstrates a recurrent moderate-to-large sized left sided pleural effusion. After ultrasound and fluoroscopic guided placement, the catheter is directed towards the left lung  apex. Following catheter placement, approximately 1.5 L of blood-tinged pleural fluid was removed. IMPRESSION: Successful placement of permanent, tunneled left pleural drainage catheter via lateral approach. 1.5 L of blood-tinged pleural fluid was removed following catheter placement. Electronically Signed   By: Simonne Come M.D.    On: 01/15/2023 13:57   CT Angio Chest Pulmonary Embolism (PE) W or WO Contrast  Result Date: 01/14/2023 CLINICAL DATA:  High probability pulmonary embolism, chest pain, dyspnea. Small cell lung cancer. EXAM: CT ANGIOGRAPHY CHEST WITH CONTRAST TECHNIQUE: Multidetector CT imaging of the chest was performed using the standard protocol during bolus administration of intravenous contrast. Multiplanar CT image reconstructions and MIPs were obtained to evaluate the vascular anatomy. RADIATION DOSE REDUCTION: This exam was performed according to the departmental dose-optimization program which includes automated exposure control, adjustment of the mA and/or kV according to patient size and/or use of iterative reconstruction technique. CONTRAST:  75mL OMNIPAQUE IOHEXOL 350 MG/ML SOLN COMPARISON:  None Available. FINDINGS: Cardiovascular: There is adequate opacification of the pulmonary arterial tree. No intraluminal filling defect identified to suggest acute pulmonary embolism. Anterior mediastinal mass encases and narrows the left upper lobar pulmonary arterial branches and demonstrates extrinsic compression and narrowing of the left lower lobar pulmonary artery centrally. Mild coronary artery calcification. Global cardiac size within normal limits. Mediastinal shift to the right noted, progressive since prior examination. Stable small pericardial effusion stable anterior mediastinal/anterior pericardial mass in keeping with the patient's known mediastinal nodal disease. The thoracic aorta is unremarkable. Right internal jugular chest port tip seen within the superior right atrium. Mediastinum/Nodes: 8.6 x 10.7 x 10.5 cm anterior mediastinal/anterior pericardial mass appears unchanged likely representing a a mediastinal nodal conglomerate in this patient with small cell lung cancer. This appears confluent with pathologic left hilar adenopathy. No additional pathologic thoracic adenopathy. Visualized thyroid is  unremarkable. Esophagus is unremarkable. Lungs/Pleura: Large left pleural effusion with complete collapse of the left lower lobe and compressive atelectasis of the left upper lobe is again seen with enlargement of the pleural fluid component increasing atelectasis of the left upper lobe. Right lung is clear. No pneumothorax. No pleural effusion on the right. No central obstructing lesion. Upper Abdomen: Pathologic left pararenal adenopathy is present. No acute abnormality. Musculoskeletal: No acute bone abnormality. No lytic or blastic bone lesion. Review of the MIP images confirms the above findings. IMPRESSION: 1. No pulmonary embolism. 2. Stable anterior mediastinal/anterior pericardial mass in keeping with the patient's known mediastinal nodal disease in this patient with small cell lung cancer. 3. Enlarging, large left pleural effusion with progressive compressive atelectasis of the left lung and increasing mediastinal shift to the right. 4. Mild coronary artery calcification. Electronically Signed   By: Helyn Numbers M.D.   On: 01/14/2023 01:38   DG Chest Port 1 View  Result Date: 01/13/2023 CLINICAL DATA:  Chest pain and shortness of breath EXAM: PORTABLE CHEST 1 VIEW COMPARISON:  Radiographs 01/10/2023 FINDINGS: Stable large left paramediastinal mass. Right IJ CVC tip in the low SVC. The right lung is clear. Slightly increased moderate-to-large left pleural effusion and associated airspace opacities. No pneumothorax. No displaced rib fractures. IMPRESSION: Slightly increased moderate-to-large left pleural effusion and associated airspace opacities. Redemonstrated large left paramediastinal mass. Electronically Signed   By: Minerva Fester M.D.   On: 01/13/2023 23:09   DG Chest 2 View  Result Date: 01/10/2023 CLINICAL DATA:  Shortness of breath, pleural effusion. EXAM: CHEST - 2 VIEW COMPARISON:  January 02, 2023. FINDINGS: Stable large left paramediastinal mass is  again noted. Right internal  jugular Port-A-Cath is unchanged. Right lung is clear. Small left pleural effusion is noted which is increased compared to prior exam. IMPRESSION: Small left pleural effusion is noted which is increased compared to prior exam. Stable large left paramediastinal mass is noted consistent with malignancy. Electronically Signed   By: Lupita Raider M.D.   On: 01/10/2023 11:27   US THORACENTESIS ASP PLEURAL SPACE W/IMG GUIDE  Result Date: 01/02/2023 INDICATION: Patient with history of HIV/AIDS, prior cryptococcal meningitis ,extensive small cell lung cancer, dyspnea, recurrent left pleural effusion; request received for diagnostic and therapeutic left thoracentesis EXAM: ULTRASOUND GUIDED DIAGNOSTIC AND THERAPEUTIC LEFT THORACENTESIS MEDICATIONS: 8 mL 1% lidocaine COMPLICATIONS: None immediate. PROCEDURE: An ultrasound guided thoracentesis was thoroughly discussed with the patient and questions answered. The benefits, risks, alternatives and complications were also discussed. The patient understands and wishes to proceed with the procedure. Written consent was obtained. Ultrasound was performed to localize and mark an adequate pocket of fluid in the LEFT chest. The area was then prepped and draped in the normal sterile fashion. 1% Lidocaine was used for local anesthesia. Under ultrasound guidance a 6 Fr Safe-T-Centesis catheter was introduced. Thoracentesis was performed. The catheter was removed and a dressing applied. FINDINGS: A total of approximately 720 mL of bloody fluid was removed. Samples were sent to the laboratory as requested by the clinical team. IMPRESSION: Successful ultrasound guided diagnostic and therapeutic LEFT thoracentesis yielding 720 mL of pleural fluid. Performed by: Artemio Aly Electronically Signed   By: Roanna Banning M.D.   On: 01/02/2023 16:13   DG Chest 1 View  Result Date: 01/02/2023 CLINICAL DATA:  [id number] S/P thoracentesis [id number] EXAM: CHEST  1 VIEW COMPARISON:  January 01, 2023 FINDINGS: The cardiomediastinal silhouette is unchanged in contour with a LEFT paramediastinal mass.RIGHT chest port with tip terminating over the superior cavoatrial junction. Decreased LEFT pleural effusion with small residual LEFT-sided pleural effusion. No pneumothorax. Similar appearance of diffuse reticular prominence. Similar appearance of the LEFT basilar homogeneous opacity, likely atelectasis. IMPRESSION: Decreased LEFT pleural effusion with small residual LEFT-sided pleural effusion. No pneumothorax. Electronically Signed   By: Meda Klinefelter M.D.   On: 01/02/2023 13:15   DG Chest 2 View  Result Date: 01/01/2023 CLINICAL DATA:  Left chest pain. EXAM: CHEST - 2 VIEW COMPARISON:  Chest radiograph 12/29/2022 and chest CTA 12/18/2022 FINDINGS: A right jugular Port-A-Cath remains in place and terminates over the lower SVC. The cardiomediastinal silhouette is unchanged with a large left-sided mediastinal/suprahilar mass again noted. A moderate-sized left pleural effusion has mildly enlarged with associated left basilar atelectasis or consolidation. The interstitial markings are mildly prominent without overt edema. No pneumothorax is identified. IMPRESSION: Mildly increased size of a moderate left pleural effusion with left basilar atelectasis or consolidation. Electronically Signed   By: Sebastian Ache M.D.   On: 01/01/2023 16:29   US THORACENTESIS ASP PLEURAL SPACE W/IMG GUIDE  Result Date: 12/29/2022 INDICATION: Patient with history of HIV/AIDS, prior cryptococcal meningitis, extensive small cell lung cancer, dyspnea, left pleural effusion. Request received for diagnostic and therapeutic left thoracentesis. EXAM: ULTRASOUND GUIDED DIAGNOSTIC AND THERAPEUTIC LEFT THORACENTESIS MEDICATIONS: 8 mL 1% lidocaine COMPLICATIONS: None immediate. PROCEDURE: An ultrasound guided thoracentesis was thoroughly discussed with the patient and questions answered. The benefits, risks, alternatives and  complications were also discussed. The patient understands and wishes to proceed with the procedure. Written consent was obtained. Ultrasound was performed to localize and mark an adequate pocket of fluid in the left  chest. The area was then prepped and draped in the normal sterile fashion. 1% Lidocaine was used for local anesthesia. Under ultrasound guidance a 6 Fr Safe-T-Centesis catheter was introduced. Thoracentesis was performed. The catheter was removed and a dressing applied. FINDINGS: A total of approximately 1 liter of hazy, blood-tinged fluid was removed. Samples were sent to the laboratory as requested by the clinical team. IMPRESSION: Successful ultrasound guided diagnostic and therapeutic left thoracentesis yielding 1 liter of pleural fluid. Performed by: Artemio Aly Electronically Signed   By: Marliss Coots M.D.   On: 12/29/2022 12:27   DG CHEST PORT 1 VIEW  Result Date: 12/29/2022 CLINICAL DATA:  [id number] Status post thoracentesis [id number] EXAM: PORTABLE CHEST 1 VIEW COMPARISON:  CXR 12/28/22 FINDINGS: Small left pleural effusion, slightly decreased from prior exam. Unchanged cardiac and mediastinal contours. Unchanged appearance of the left mediastinal mass. No pneumothorax. No radiographically apparent displaced rib fractures. Visualized upper abdomen unremarkable. Right-sided chest port in place with the tip near the cavoatrial junction. IMPRESSION: Small left pleural effusion, slightly decreased from prior exam. No pneumothorax. Electronically Signed   By: Lorenza Cambridge M.D.   On: 12/29/2022 10:55   DG Chest Port 1 View  Result Date: 12/28/2022 CLINICAL DATA:  Shortness of breath EXAM: PORTABLE CHEST 1 VIEW COMPARISON:  12/27/2022, CT from 12/18/2022 FINDINGS: Cardiac shadow is stable. Mediastinal mass is again noted on the left lung along the aortic knob. Right chest wall port is noted in satisfactory position. Left pleural effusion is noted similar to that seen on prior chest CT.  IMPRESSION: Stable appearance of the chest with mediastinal mass and left pleural effusion. Electronically Signed   By: Alcide Clever M.D.   On: 12/28/2022 21:43   DG CHEST PORT 1 VIEW  Result Date: 12/27/2022 CLINICAL DATA:  Chest pain and dyspnea. EXAM: PORTABLE CHEST 1 VIEW COMPARISON:  12/20/2012 FINDINGS: Unchanged cardiac silhouette and mediastinal contours including known anterior mediastinal mass. Interval increase in now moderate-sized left-sided effusion worsening left basilar heterogeneous/consolidative opacities. The right hemithorax remains well aerated. Interval placement of right jugular approach port a catheter with tip projected over the superior cavoatrial junction. No pneumothorax. No evidence of edema. No acute osseous abnormalities. IMPRESSION: 1. Interval increase in now moderate-sized left-sided effusion with associated worsening left basilar heterogeneous/consolidative opacities. 2. Interval placement of right jugular approach port a catheter with tip projected over the superior cavoatrial junction. No pneumothorax. 3. Redemonstrated known left-sided anterior mediastinal mass. Electronically Signed   By: Simonne Come M.D.   On: 12/27/2022 16:23   IR BONE MARROW BIOPSY & ASPIRATION  Result Date: 12/27/2022 INDICATION: Recent diagnosis lung cancer now with concern for involvement bone marrow. Please perform image guided bone marrow biopsy for tissue diagnostic purposes. Additionally, please perform image guided placement port a catheter for chemotherapy administration. EXAM: 1. FLUOROSCOPIC GUIDED BONE MARROW BIOPSY AND ASPIRATION 2. IMAGE GUIDED PORT A CATHETER PLACEMENT MEDICATIONS: None ANESTHESIA/SEDATION: Moderate (conscious) sedation was employed during this procedure as administered by the Interventional Radiology RN. A total of Versed 1.5 mg and Fentanyl 75 mcg was administered intravenously. Moderate Sedation Time: 57 minutes. The patient's level of consciousness and vital signs  were monitored continuously by radiology nursing throughout the procedure under my direct supervision. FLUOROSCOPY TIME: 1 minute, 39 seconds (8 mGy) COMPLICATIONS: None immediate. PROCEDURE: Informed consent was obtained from the patient following an explanation of the procedure, risks, benefits and alternatives. The patient understands, agrees and consents for the procedure. All questions were addressed. A time  out was performed prior to the initiation of the procedure. The patient was positioned prone on the fluoroscopy table and the posterior aspect of the right iliac crest was marked fluoroscopically. The operative site was prepped and draped in the usual sterile fashion. Under sterile conditions and local anesthesia, an 11 gauge coaxial bone biopsy needle was advanced into the posterior aspect of the right iliac marrow space under intermittent fluoroscopic guidance. Multiple fluoroscopic images were saved procedural documentation purposes. Initially, a bone marrow aspiration was performed. Next, a bone marrow biopsy was obtained with the 11 gauge outer bone marrow device. The needle was removed and superficial hemostasis was obtained with manual compression. A dressing was applied. The patient tolerated the procedure well without immediate post procedural complication. Attention was now paid towards placement of the port a catheter. The right neck and chest were prepped with chlorhexidine in a sterile fashion, and a sterile drape was applied covering the operative field. Maximum barrier sterile technique with sterile gowns and gloves were used for the procedure. A timeout was performed prior to the initiation of the procedure. Local anesthesia was provided with 1% lidocaine with epinephrine. After creating a small venotomy incision, a micropuncture kit was utilized to access the internal jugular vein. Real-time ultrasound guidance was utilized for vascular access including the acquisition of a permanent  ultrasound image documenting patency of the accessed vessel. The microwire was utilized to measure appropriate catheter length. A subcutaneous port pocket was then created along the upper chest wall utilizing a combination of sharp and blunt dissection. The pocket was irrigated with sterile saline. A single lumen [employer] power injectable port was chosen for placement. The 8 Fr catheter was tunneled from the port pocket site to the venotomy incision. The port was placed in the pocket. The external catheter was trimmed to appropriate length. At the venotomy, an 8 Fr peel-away sheath was placed over a guidewire under fluoroscopic guidance. The catheter was then placed through the sheath and the sheath was removed. Final catheter positioning was confirmed and documented with a fluoroscopic spot radiograph. The port was accessed with a Huber needle, aspirated and flushed. The venotomy site was closed with an interrupted 4-0 Vicryl suture. The port pocket incision was closed with interrupted 2-0 Vicryl suture. Dermabond and Steri-strips were applied to both incisions. Port a Catheter was left accessed for in hospitalization usage. Dressings were applied. The patient tolerated the procedure well without immediate post procedural complication. IMPRESSION: 1. Successful fluoroscopic guided right iliac bone marrow aspiration and core biopsy. 2. Successful image guided Port a catheter placement. Port a catheter is ready immediate usage. Electronically Signed   By: Simonne Come M.D.   On: 12/27/2022 16:21   IR IMAGING GUIDED PORT INSERTION  Result Date: 12/27/2022 INDICATION: Recent diagnosis lung cancer now with concern for involvement bone marrow. Please perform image guided bone marrow biopsy for tissue diagnostic purposes. Additionally, please perform image guided placement port a catheter for chemotherapy administration. EXAM: 1. FLUOROSCOPIC GUIDED BONE MARROW BIOPSY AND ASPIRATION 2. IMAGE GUIDED PORT A  CATHETER PLACEMENT MEDICATIONS: None ANESTHESIA/SEDATION: Moderate (conscious) sedation was employed during this procedure as administered by the Interventional Radiology RN. A total of Versed 1.5 mg and Fentanyl 75 mcg was administered intravenously. Moderate Sedation Time: 57 minutes. The patient's level of consciousness and vital signs were monitored continuously by radiology nursing throughout the procedure under my direct supervision. FLUOROSCOPY TIME: 1 minute, 39 seconds (8 mGy) COMPLICATIONS: None immediate. PROCEDURE: Informed consent was obtained from  the patient following an explanation of the procedure, risks, benefits and alternatives. The patient understands, agrees and consents for the procedure. All questions were addressed. A time out was performed prior to the initiation of the procedure. The patient was positioned prone on the fluoroscopy table and the posterior aspect of the right iliac crest was marked fluoroscopically. The operative site was prepped and draped in the usual sterile fashion. Under sterile conditions and local anesthesia, an 11 gauge coaxial bone biopsy needle was advanced into the posterior aspect of the right iliac marrow space under intermittent fluoroscopic guidance. Multiple fluoroscopic images were saved procedural documentation purposes. Initially, a bone marrow aspiration was performed. Next, a bone marrow biopsy was obtained with the 11 gauge outer bone marrow device. The needle was removed and superficial hemostasis was obtained with manual compression. A dressing was applied. The patient tolerated the procedure well without immediate post procedural complication. Attention was now paid towards placement of the port a catheter. The right neck and chest were prepped with chlorhexidine in a sterile fashion, and a sterile drape was applied covering the operative field. Maximum barrier sterile technique with sterile gowns and gloves were used for the procedure. A timeout was  performed prior to the initiation of the procedure. Local anesthesia was provided with 1% lidocaine with epinephrine. After creating a small venotomy incision, a micropuncture kit was utilized to access the internal jugular vein. Real-time ultrasound guidance was utilized for vascular access including the acquisition of a permanent ultrasound image documenting patency of the accessed vessel. The microwire was utilized to measure appropriate catheter length. A subcutaneous port pocket was then created along the upper chest wall utilizing a combination of sharp and blunt dissection. The pocket was irrigated with sterile saline. A single lumen [employer] power injectable port was chosen for placement. The 8 Fr catheter was tunneled from the port pocket site to the venotomy incision. The port was placed in the pocket. The external catheter was trimmed to appropriate length. At the venotomy, an 8 Fr peel-away sheath was placed over a guidewire under fluoroscopic guidance. The catheter was then placed through the sheath and the sheath was removed. Final catheter positioning was confirmed and documented with a fluoroscopic spot radiograph. The port was accessed with a Huber needle, aspirated and flushed. The venotomy site was closed with an interrupted 4-0 Vicryl suture. The port pocket incision was closed with interrupted 2-0 Vicryl suture. Dermabond and Steri-strips were applied to both incisions. Port a Catheter was left accessed for in hospitalization usage. Dressings were applied. The patient tolerated the procedure well without immediate post procedural complication. IMPRESSION: 1. Successful fluoroscopic guided right iliac bone marrow aspiration and core biopsy. 2. Successful image guided Port a catheter placement. Port a catheter is ready immediate usage. Electronically Signed   By: Simonne Come M.D.   On: 12/27/2022 16:21   MR BRAIN W WO CONTRAST  Result Date: 12/24/2022 CLINICAL DATA:  Non-small cell lung  cancer staging EXAM: MRI HEAD WITHOUT AND WITH CONTRAST TECHNIQUE: Multiplanar, multiecho pulse sequences of the brain and surrounding structures were obtained without and with intravenous contrast. CONTRAST:  7mL GADAVIST GADOBUTROL 1 MMOL/ML IV SOLN COMPARISON:  Head CT from 4 days ago FINDINGS: Brain: No enhancement or swelling to suggest metastatic disease. FLAIR hyperintensity in the cerebral white matter especially affecting the frontal lobes and the left centrum semiovale, without involvement of subcortical U fibers, mass effect, or enhancement. No acute or subacute infarct. No hemorrhage, hydrocephalus, or mass. Vascular:  Major flow voids are preserved Skull and upper cervical spine: Normal marrow signal Sinuses/Orbits: Negative IMPRESSION: 1. No enhancement to suggest metastatic disease. 2. 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 Electronically Signed   By: Tiburcio Pea M.D.   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 EXAM: DIAGNOSTIC LUMBAR PUNCTURE UNDER FLUOROSCOPIC GUIDANCE COMPARISON:  06/17/2018 FLUOROSCOPY TIME:  Three mGy PROCEDURE: Informed consent was obtained from the patient prior to the procedure, including potential complications of headache, allergy, and pain. With the patient prone, the lower back was prepped with Betadine. 1% Lidocaine was used for local anesthesia. Lumbar puncture was performed at the L2-L3 level using a 3.5 inch, 20 gauge needle with return of clear CSF with an opening pressure of 21 cm water. A total of 16 ml of CSF were obtained for laboratory studies. The patient tolerated the procedure well and there were no apparent complications. IMPRESSION: Technically successful fluoroscopic guided lumbar puncture at L2-L3. Marliss Coots, MD [facility] Electronically  Signed   By: Marliss Coots M.D.   On: 12/22/2022 15:42   DG Chest Port 1 View  Result Date: 12/21/2022 CLINICAL DATA:  Status post bronchoscopy, cough EXAM: PORTABLE CHEST 1 VIEW COMPARISON:  CTA chest 3 days prior FINDINGS: The heart size is stable. The large left upper mediastinal mass is not significantly changed. The left pleural effusion has increased in size, with worsened aeration of the left lower lobe since the radiograph from 3 days prior. The right lung is clear. There is no right effusion. There is no pneumothorax There is no acute osseous abnormality. IMPRESSION: 1. Unchanged large mediastinal mass. 2. Increased size of the left pleural effusion with worsened aeration of the left lower lobe. Electronically Signed   By: Lesia Hausen M.D.   On: 12/21/2022 16:16   DG C-ARM BRONCHOSCOPY  Result Date: 12/21/2022 C-ARM BRONCHOSCOPY: Fluoroscopy was utilized by the requesting physician.  No radiographic interpretation.   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 RADIATION DOSE REDUCTION: This exam was performed according to the departmental dose-optimization program which includes automated exposure control, adjustment of the mA and/or kV according to patient size and/or use of iterative reconstruction technique. COMPARISON:  None Available. FINDINGS: Brain: No evidence of acute infarction, hemorrhage, hydrocephalus, extra-axial collection or mass lesion/mass effect. Vascular: No hyperdense vessel or unexpected calcification. Skull: Normal. Negative for fracture or focal lesion. Sinuses/Orbits: No middle ear or mastoid effusion. Paranasal sinuses are clear. Orbits are unremarkable. Other: None. IMPRESSION: No acute intracranial abnormality.  Electronically Signed   By: Lorenza Cambridge M.D.   On: 12/20/2022 14:31   DG Chest Port 1 View  Result Date: 12/18/2022 CLINICAL DATA:  Status post thoracentesis. EXAM: PORTABLE CHEST 1 VIEW COMPARISON:  CT earlier 12/18/2022 FINDINGS: Persistent anterior mediastinal mass centered left of midline. Tiny left effusion. No pneumothorax. Mild left lung base opacity. Right lung is grossly clear. Normal cardiac silhouette. No edema. Overlapping cardiac leads. IMPRESSION: Known mediastinal mass. Tiny residual left effusion. No pneumothorax. Electronically Signed   By: Karen Kays M.D.   On: 12/18/2022 15:55   (Echo, Carotid, EGD, Colonoscopy, ERCP)    Subjective: Patient  seen in the morning rounds.  Denies any complaints.  He is on room air.  He is able to demonstrate that he is able to drain his percutaneous catheter into a canister.  Patient does complain of pain on his tube insertion site as well as on his back.   Discharge Exam: Vitals:   01/16/23 2108 01/17/23 0526  BP: (!) 143/96 (!) 122/99  Pulse: (!) 119 (!) 106  Resp: 18 18  Temp: 99.8 F (37.7 C) 98.6 F (37 C)  SpO2: 94% 100%   Vitals:   01/16/23 1343 01/16/23 1734 01/16/23 2108 01/17/23 0526  BP: (!) 141/108 (!) 125/101 (!) 143/96 (!) 122/99  Pulse: (!) 116 (!) 111 (!) 119 (!) 106  Resp:   18 18  Temp: 98.2 F (36.8 C)  99.8 F (37.7 C) 98.6 F (37 C)  TempSrc: Oral  Oral Oral  SpO2: 96%  94% 100%  Weight:      Height:        General: Pt is alert, awake, not in acute distress Sitting in chair.  On room air. Cardiovascular: RRR, S1/S2 +, no rubs, no gallops Respiratory: Poor air entry at left base.  No wheezing, no rhonchi Pleurx catheter left upper abdominal wall, clean and dry. Abdominal: Soft, NT, ND, bowel sounds + Extremities: no edema, no cyanosis    The results of significant diagnostics from this hospitalization (including imaging, microbiology, ancillary and laboratory) are listed below for reference.      Microbiology: No results found for this or any previous visit (from the past 240 hour(s)).   Labs: BNP (last 3 results) No results for input(s): "BNP" in the last 8760 hours. Basic Metabolic Panel: Recent Labs  Lab 01/13/23 2220 01/14/23 0657 01/15/23 0523 01/16/23 0553 01/17/23 0616  NA 140 144 139 137 135  K 3.8 3.1* 4.5 4.2 4.5  CL 106 114* 104 99 102  CO2 22 21* 23 23 22   GLUCOSE 129* 87 110* 152* 177*  BUN 9 7 9 14 12   CREATININE 1.36* 0.78 1.05 1.19 1.11  CALCIUM 8.5* 6.9* 9.1 9.3 9.0   Liver Function Tests: Recent Labs  Lab 01/10/23 1110 01/15/23 0523 01/16/23 0553 01/17/23 0616  AST 15 21 24 26   ALT 32 29 25 28   ALKPHOS 106 92 93 91  BILITOT 0.3 0.5 0.5 0.3  PROT 6.7 6.8 6.7 6.9  ALBUMIN 3.8 3.4* 3.3* 3.3*   No results for input(s): "LIPASE", "AMYLASE" in the last 168 hours. No results for input(s): "AMMONIA" in the last 168 hours. CBC: Recent Labs  Lab 01/10/23 1110 01/13/23 2220 01/14/23 0657 01/15/23 0523 01/16/23 0553 01/17/23 0616  WBC 2.0* 3.8* 3.8* 4.6 7.2 8.8  NEUTROABS 0.4*  --   --  2.1 4.6 5.6  HGB 14.3 14.6 13.3 14.1 14.6 14.3  HCT 39.7 41.6 38.8* 41.1 41.9 40.6  MCV 86.9 90.0 91.7 92.2 89.9 90.0  PLT 273 315 297 302 291 274   Cardiac Enzymes: No results for input(s): "CKTOTAL", "CKMB", "CKMBINDEX", "TROPONINI" in the last 168 hours. BNP: Invalid input(s): "POCBNP" CBG: No results for input(s): "GLUCAP" in the last 168 hours. D-Dimer No results for input(s): "DDIMER" in the last 72 hours. Hgb A1c No results for input(s): "HGBA1C" in the last 72 hours. Lipid Profile No results for input(s): "CHOL", "HDL", "LDLCALC", "TRIG", "CHOLHDL", "LDLDIRECT" in the last 72 hours. Thyroid function studies No results for input(s): "TSH", "T4TOTAL", "T3FREE", "THYROIDAB" in the last 72 hours.  Invalid input(s): "FREET3" Anemia work  up No results for input(s): "VITAMINB12", "FOLATE", "FERRITIN", "TIBC", "IRON", "RETICCTPCT" in the last  72 hours. Urinalysis    Component Value Date/Time   COLORURINE STRAW (A) 12/18/2022 0552   APPEARANCEUR CLEAR 12/18/2022 0552   LABSPEC 1.025 12/18/2022 0552   PHURINE 7.0 12/18/2022 0552   GLUCOSEU NEGATIVE 12/18/2022 0552   HGBUR NEGATIVE 12/18/2022 0552   BILIRUBINUR NEGATIVE 12/18/2022 0552   KETONESUR NEGATIVE 12/18/2022 0552   PROTEINUR NEGATIVE 12/18/2022 0552   NITRITE NEGATIVE 12/18/2022 0552   LEUKOCYTESUR NEGATIVE 12/18/2022 0552   Sepsis Labs Recent Labs  Lab 01/14/23 0657 01/15/23 0523 01/16/23 0553 01/17/23 0616  WBC 3.8* 4.6 7.2 8.8   Microbiology No results found for this or any previous visit (from the past 240 hour(s)).   Time coordinating discharge: 32 minutes  SIGNED:   Dorcas Carrow, MD  [facility] 01/17/2023, 9:30 AM

## 2023-01-17 NOTE — Plan of Care (Signed)

## 2023-01-18 ENCOUNTER — Other Ambulatory Visit: Payer: Self-pay | Admitting: Hematology & Oncology

## 2023-01-18 ENCOUNTER — Ambulatory Visit: Payer: 59

## 2023-01-18 ENCOUNTER — Encounter: Payer: Self-pay | Admitting: Hematology & Oncology

## 2023-01-18 DIAGNOSIS — C3492 Malignant neoplasm of unspecified part of left bronchus or lung: Secondary | ICD-10-CM

## 2023-01-18 DIAGNOSIS — C7A1 Malignant poorly differentiated neuroendocrine tumors: Secondary | ICD-10-CM

## 2023-01-18 NOTE — Progress Notes (Unsigned)
Pharmacist Chemotherapy Monitoring - Initial Assessment    Anticipated start date: 01/22/23   The following has been reviewed per standard work regarding the patient's treatment regimen: The patient's diagnosis, treatment plan and drug doses, and organ/hematologic function Lab orders and baseline tests specific to treatment regimen  The treatment plan start date, drug sequencing, and pre-medications Prior authorization status  Patient's documented medication list, including drug-drug interaction screen and prescriptions for anti-emetics and supportive care specific to the treatment regimen The drug concentrations, fluid compatibility, administration routes, and timing of the medications to be used The patient's access for treatment and lifetime cumulative dose history, if applicable  The patient's medication allergies and previous infusion related reactions, if applicable   Changes made to treatment plan:  N/A  Follow up needed:  N/A   Christopher Henson, [facility], 01/18/2023  10:29 AM

## 2023-01-19 ENCOUNTER — Encounter: Payer: Self-pay | Admitting: *Deleted

## 2023-01-19 LAB — FUNGUS CULTURE WITH STAIN

## 2023-01-19 LAB — FUNGAL ORGANISM REFLEX

## 2023-01-19 LAB — FUNGUS CULTURE RESULT

## 2023-01-19 NOTE — Progress Notes (Signed)
Patient discharged from the hospital. Scheduled for follow up on 01/22/2023.  Oncology Nurse Navigator Documentation     01/19/2023   11:00 AM  Oncology Nurse Navigator Flowsheets  Navigator Follow Up Date: 01/22/2023  Navigator Follow Up Reason: Follow-up Appointment;Chemotherapy  Navigator Location [facility]  Navigator Encounter Type Appt/Treatment Plan Review  Patient Visit Type MedOnc  Treatment Phase Active Tx  Barriers/Navigation Needs Coordination of Care;Education  Interventions None Required  Acuity Level 2-Minimal Needs (1-2 Barriers Identified)  Time Spent with Patient 15

## 2023-01-21 ENCOUNTER — Other Ambulatory Visit: Payer: Self-pay | Admitting: Internal Medicine

## 2023-01-22 ENCOUNTER — Inpatient Hospital Stay: Payer: Medicaid Other

## 2023-01-22 ENCOUNTER — Other Ambulatory Visit: Payer: Self-pay

## 2023-01-22 ENCOUNTER — Inpatient Hospital Stay: Payer: 59 | Admitting: Hematology & Oncology

## 2023-01-22 ENCOUNTER — Inpatient Hospital Stay: Payer: 59

## 2023-01-22 DIAGNOSIS — J9859 Other diseases of mediastinum, not elsewhere classified: Secondary | ICD-10-CM

## 2023-01-22 MED ORDER — FENTANYL 25 MCG/HR TD PT72: 1.0000 | MEDICATED_PATCH | TRANSDERMAL | 0 refills | Status: AC

## 2023-01-22 MED ORDER — OXYCODONE HCL 5 MG PO TABS: 5.0000 mg | ORAL_TABLET | ORAL | 0 refills | Status: DC | PRN

## 2023-01-23 ENCOUNTER — Other Ambulatory Visit: Payer: Self-pay

## 2023-01-23 ENCOUNTER — Encounter: Payer: Self-pay | Admitting: Hematology & Oncology

## 2023-01-23 ENCOUNTER — Inpatient Hospital Stay: Payer: Medicaid Other

## 2023-01-23 ENCOUNTER — Encounter: Payer: Self-pay | Admitting: *Deleted

## 2023-01-23 ENCOUNTER — Inpatient Hospital Stay (HOSPITAL_BASED_OUTPATIENT_CLINIC_OR_DEPARTMENT_OTHER): Payer: 59 | Admitting: Hematology & Oncology

## 2023-01-23 ENCOUNTER — Other Ambulatory Visit (HOSPITAL_BASED_OUTPATIENT_CLINIC_OR_DEPARTMENT_OTHER): Payer: Self-pay

## 2023-01-23 ENCOUNTER — Inpatient Hospital Stay: Payer: 59

## 2023-01-23 VITALS — BP 137/95 | HR 87 | Temp 98.3°F | Resp 17

## 2023-01-23 DIAGNOSIS — Z21 Asymptomatic human immunodeficiency virus [HIV] infection status: Secondary | ICD-10-CM | POA: Diagnosis not present

## 2023-01-23 DIAGNOSIS — C7A1 Malignant poorly differentiated neuroendocrine tumors: Secondary | ICD-10-CM

## 2023-01-23 DIAGNOSIS — Z5189 Encounter for other specified aftercare: Secondary | ICD-10-CM | POA: Diagnosis not present

## 2023-01-23 DIAGNOSIS — C3492 Malignant neoplasm of unspecified part of left bronchus or lung: Secondary | ICD-10-CM

## 2023-01-23 DIAGNOSIS — Z452 Encounter for adjustment and management of vascular access device: Secondary | ICD-10-CM | POA: Diagnosis not present

## 2023-01-23 DIAGNOSIS — J90 Pleural effusion, not elsewhere classified: Secondary | ICD-10-CM | POA: Diagnosis not present

## 2023-01-23 DIAGNOSIS — D709 Neutropenia, unspecified: Secondary | ICD-10-CM | POA: Diagnosis not present

## 2023-01-23 DIAGNOSIS — Z5112 Encounter for antineoplastic immunotherapy: Secondary | ICD-10-CM | POA: Diagnosis present

## 2023-01-23 DIAGNOSIS — Z5111 Encounter for antineoplastic chemotherapy: Secondary | ICD-10-CM | POA: Diagnosis present

## 2023-01-23 LAB — CBC WITH DIFFERENTIAL (CANCER CENTER ONLY)
Abs Immature Granulocytes: 0.12 10*3/uL — ABNORMAL HIGH (ref 0.00–0.07)
Basophils Absolute: 0.1 10*3/uL (ref 0.0–0.1)
Basophils Relative: 1 %
Eosinophils Absolute: 0.1 10*3/uL (ref 0.0–0.5)
Eosinophils Relative: 1 %
HCT: 35 % — ABNORMAL LOW (ref 39.0–52.0)
Hemoglobin: 12.4 g/dL — ABNORMAL LOW (ref 13.0–17.0)
Immature Granulocytes: 2 %
Lymphocytes Relative: 18 %
Lymphs Abs: 1.4 10*3/uL (ref 0.7–4.0)
MCH: 31.4 pg (ref 26.0–34.0)
MCHC: 35.4 g/dL (ref 30.0–36.0)
MCV: 88.6 fL (ref 80.0–100.0)
Monocytes Absolute: 0.8 10*3/uL (ref 0.1–1.0)
Monocytes Relative: 11 %
Neutro Abs: 5.2 10*3/uL (ref 1.7–7.7)
Neutrophils Relative %: 67 %
Platelet Count: 238 10*3/uL (ref 150–400)
RBC: 3.95 MIL/uL — ABNORMAL LOW (ref 4.22–5.81)
RDW: 13.1 % (ref 11.5–15.5)
WBC Count: 7.6 10*3/uL (ref 4.0–10.5)
nRBC: 0 % (ref 0.0–0.2)

## 2023-01-23 LAB — FUNGUS CULTURE RESULT

## 2023-01-23 LAB — FUNGUS CULTURE WITH STAIN

## 2023-01-23 LAB — CMP (CANCER CENTER ONLY)
ALT: 24 U/L (ref 0–44)
AST: 27 U/L (ref 15–41)
Albumin: 3.3 g/dL — ABNORMAL LOW (ref 3.5–5.0)
Alkaline Phosphatase: 86 U/L (ref 38–126)
Anion gap: 6 (ref 5–15)
BUN: 16 mg/dL (ref 6–20)
CO2: 30 mmol/L (ref 22–32)
Calcium: 8.5 mg/dL — ABNORMAL LOW (ref 8.9–10.3)
Chloride: 106 mmol/L (ref 98–111)
Creatinine: 1.2 mg/dL (ref 0.61–1.24)
GFR, Estimated: >60 mL/min (ref >60)
Glucose, Bld: 140 mg/dL — ABNORMAL HIGH (ref 70–99)
Potassium: 4.1 mmol/L (ref 3.5–5.1)
Sodium: 142 mmol/L (ref 135–145)
Total Bilirubin: 0.3 mg/dL (ref 0.3–1.2)
Total Protein: 6.1 g/dL — ABNORMAL LOW (ref 6.5–8.1)

## 2023-01-23 LAB — LACTATE DEHYDROGENASE: LDH: 1699 U/L — ABNORMAL HIGH (ref 98–192)

## 2023-01-23 LAB — FUNGAL ORGANISM REFLEX

## 2023-01-23 LAB — TOTAL PROTEIN, URINE DIPSTICK: Protein, ur: NEGATIVE mg/dL

## 2023-01-23 LAB — TSH: TSH: 1.293 u[IU]/mL (ref 0.350–4.500)

## 2023-01-23 MED ORDER — ONDANSETRON HCL 8 MG PO TABS: 8.0000 mg | ORAL_TABLET | Freq: Three times a day (TID) | ORAL | 1 refills | Status: DC | PRN

## 2023-01-23 MED ORDER — SODIUM CHLORIDE 0.9 % IV SOLN
10.0000 mg | Freq: Once | INTRAVENOUS | Status: AC
  Administered 2023-01-23: 10 mg via INTRAVENOUS
  Filled 2023-01-23: qty 10

## 2023-01-23 MED ORDER — LIDOCAINE-PRILOCAINE 2.5-2.5 % EX CREA: TOPICAL_CREAM | CUTANEOUS | 3 refills | Status: DC

## 2023-01-23 MED ORDER — SODIUM CHLORIDE 0.9% FLUSH
10.0000 mL | INTRAVENOUS | Status: DC | PRN
  Administered 2023-01-23: 10 mL

## 2023-01-23 MED ORDER — DIPHENHYDRAMINE HCL 50 MG/ML IJ SOLN
50.0000 mg | Freq: Once | INTRAMUSCULAR | Status: AC
  Administered 2023-01-23: 50 mg via INTRAVENOUS
  Filled 2023-01-23: qty 1

## 2023-01-23 MED ORDER — SODIUM CHLORIDE 0.9 % IV SOLN
15.0000 mg/kg | Freq: Once | INTRAVENOUS | Status: AC
  Administered 2023-01-23: 1100 mg via INTRAVENOUS
  Filled 2023-01-23: qty 32

## 2023-01-23 MED ORDER — SODIUM CHLORIDE 0.9 % IV SOLN: INTRAVENOUS | Status: DC

## 2023-01-23 MED ORDER — FAMOTIDINE IN NACL 20-0.9 MG/50ML-% IV SOLN
20.0000 mg | Freq: Once | INTRAVENOUS | Status: AC
  Administered 2023-01-23: 20 mg via INTRAVENOUS
  Filled 2023-01-23: qty 50

## 2023-01-23 MED ORDER — DEXAMETHASONE 4 MG PO TABS: ORAL_TABLET | ORAL | 1 refills | Status: DC

## 2023-01-23 MED ORDER — SODIUM CHLORIDE 0.9 % IV SOLN
1200.0000 mg | Freq: Once | INTRAVENOUS | Status: AC
  Administered 2023-01-23: 1200 mg via INTRAVENOUS
  Filled 2023-01-23: qty 20

## 2023-01-23 MED ORDER — SODIUM CHLORIDE 0.9 % IV SOLN
658.2000 mg | Freq: Once | INTRAVENOUS | Status: AC
  Administered 2023-01-23: 660 mg via INTRAVENOUS
  Filled 2023-01-23: qty 66

## 2023-01-23 MED ORDER — HYDROMORPHONE HCL 4 MG PO TABS
4.0000 mg | ORAL_TABLET | Freq: Four times a day (QID) | ORAL | 0 refills | Status: DC | PRN
Start: 2023-01-23 — End: 2023-02-05
  Filled 2023-01-23: qty 90, 23d supply, fill #0

## 2023-01-23 MED ORDER — SODIUM CHLORIDE 0.9 % IV SOLN
150.0000 mg | Freq: Once | INTRAVENOUS | Status: AC
  Administered 2023-01-23: 150 mg via INTRAVENOUS
  Filled 2023-01-23: qty 150

## 2023-01-23 MED ORDER — PROCHLORPERAZINE MALEATE 10 MG PO TABS: 10.0000 mg | ORAL_TABLET | Freq: Four times a day (QID) | ORAL | 1 refills | Status: DC | PRN

## 2023-01-23 MED ORDER — HEPARIN SOD (PORK) LOCK FLUSH 100 UNIT/ML IV SOLN
500.0000 [IU] | Freq: Once | INTRAVENOUS | Status: AC | PRN
  Administered 2023-01-23: 500 [IU]

## 2023-01-23 MED ORDER — PALONOSETRON HCL INJECTION 0.25 MG/5ML
0.2500 mg | Freq: Once | INTRAVENOUS | Status: AC
  Administered 2023-01-23: 0.25 mg via INTRAVENOUS
  Filled 2023-01-23: qty 5

## 2023-01-23 MED ORDER — SODIUM CHLORIDE 0.9 % IV SOLN
180.0000 mg/m2 | Freq: Once | INTRAVENOUS | Status: AC
  Administered 2023-01-23: 354 mg via INTRAVENOUS
  Filled 2023-01-23: qty 59

## 2023-01-23 NOTE — Progress Notes (Signed)
Hematology and Oncology Follow Up Visit  Christopher Henson [id number] 01-19-1981 42 y.o. 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  Unfortunate, he is back in the hospital because of recurrent pleural effusion on the left side.  He underwent another thoracentesis and actually had a Pleurx catheter in place.  This I think will help.  We clearly go to have to make a change with his protocol.  This certainly is behaving more like a nonsmall cell lung cancer.  As such, we will have to treat him with a nonsmall cell lung cancer protocol.  I will go ahead and get him on carboplatinum/Taxol/Avastin/Tecentriq.  He is still having problems with his pain medication.  Apparently, Medicaid is not going to pay for his pain medication.  I am not sure as to how or why this is working.  I am never heard of this before.  His appetite is down a little bit.  He has had no nausea or vomiting.  He is on Marinol.  He has had no change in bowel or bladder habits.  He has not no bleeding.  He has had no leg swelling.  He has had no headache.  So far, there is been no fever.  Thankfully, he has had no issues with the HIV.  He is on Biktarvy for this.  Overall, I would say his performance status is probably ECOG 1.   Medications:  Current Outpatient Medications:    acetaminophen (TYLENOL) 325 MG tablet, Take 2 tablets (650 mg total) by mouth every 6 (six) hours as needed., Disp: , Rfl:    bictegravir-emtricitabine-tenofovir AF (BIKTARVY) 50-200-25 MG TABS tablet, Take 1 tablet by mouth daily. (Patient not taking: Reported on 01/13/2023), Disp: 30 tablet, Rfl: 0   dronabinol (MARINOL) 5 MG capsule, Take 1  capsule (5 mg total) by mouth 2 (two) times daily before a meal. (Patient not taking: Reported on 01/13/2023), Disp: 60 capsule, Rfl: 0   fentaNYL (DURAGESIC) 25 MCG/HR, Place 1 patch onto the skin every 3 (three) days., Disp: 10 patch, Rfl: 0   fluconazole (DIFLUCAN) 200 MG tablet, Take 1 tablet (200 mg total) by mouth daily for 30 doses. (Patient not taking: Reported on 01/13/2023), Disp: 30 tablet, Rfl: 0   oxyCODONE (OXY IR/ROXICODONE) 5 MG immediate release tablet, Take 1 tablet (5 mg total) by mouth every 4 (four) hours as needed for up to 10 days for moderate pain., Disp: 60 tablet, Rfl: 0   senna-docusate (SENOKOT-S) 8.6-50 MG tablet, Take 1 tablet by mouth 2 (two) times daily between meals as needed for mild constipation. (Patient not taking: Reported on 01/09/2023), Disp: , Rfl:    sulfamethoxazole-trimethoprim (BACTRIM DS) 800-160 MG tablet, Take 1 tablet by mouth daily. (Patient not taking: Reported on 01/13/2023), Disp: 30 tablet, Rfl: 0  Allergies: No Known Allergies  Past Medical History, Surgical history, Social history, and Family History were reviewed and updated.  Review of Systems: Review of Systems  Constitutional: Negative.   HENT:  Negative.    Eyes: Negative.   Respiratory:  Positive for cough and shortness of breath.   Cardiovascular:  Positive for chest pain.  Gastrointestinal: Negative.   Endocrine: Negative.   Genitourinary: Negative.    Musculoskeletal: Negative.  Skin: Negative.   Neurological: Negative.   Hematological: Negative.   Psychiatric/Behavioral: Negative.      Physical Exam:  height is 6' (1.829 m) and weight is 172 lb (78 kg). His oral temperature is 98.7 F (37.1 C). His blood pressure is 112/76 and his pulse is 99. His respiration is 18 and oxygen saturation is 100%.   Wt Readings from Last 3 Encounters:  01/23/23 172 lb (78 kg)  01/13/23 168 lb (76.2 kg)  01/10/23 166 lb 12.8 oz (75.7 kg)    Physical Exam Vitals reviewed.  HENT:      Head: Normocephalic and atraumatic.  Eyes:     Pupils: Pupils are equal, round, and reactive to light.  Cardiovascular:     Rate and Rhythm: Normal rate and regular rhythm.     Heart sounds: Normal heart sounds.     Comments: Cardiac exam is tachycardic but regular.  There are no murmurs. Pulmonary:     Effort: Pulmonary effort is normal.     Breath sounds: Normal breath sounds.     Comments: His lungs sound relatively clear bilaterally.  Maybe a little bit decreased and breath sounds are on the left lower lung. Abdominal:     General: Bowel sounds are normal.     Palpations: Abdomen is soft.     Comments: Abdominal exam is soft.  He has good bowel sounds.  There is no fluid wave.  There is no palpable liver or spleen tip.  Musculoskeletal:        General: No tenderness or deformity. Normal range of motion.     Cervical back: Normal range of motion.  Lymphadenopathy:     Cervical: No cervical adenopathy.  Skin:    General: Skin is warm and dry.     Findings: No erythema or rash.  Neurological:     Mental Status: He is alert and oriented to person, place, and time.  Psychiatric:        Behavior: Behavior normal.        Thought Content: Thought content normal.        Judgment: Judgment normal.     Lab Results  Component Value Date   WBC 7.6 01/23/2023   HGB 12.4 (L) 01/23/2023   HCT 35.0 (L) 01/23/2023   MCV 88.6 01/23/2023   PLT 238 01/23/2023     Chemistry      Component Value Date/Time   NA 142 01/23/2023 0829   NA 143 02/25/2019 1041   K 4.1 01/23/2023 0829   CL 106 01/23/2023 0829   CO2 30 01/23/2023 0829   BUN 16 01/23/2023 0829   BUN 8 02/25/2019 1041   CREATININE 1.20 01/23/2023 0829   CREATININE 0.89 12/14/2022 1132      Component Value Date/Time   CALCIUM 8.5 (L) 01/23/2023 0829   ALKPHOS 86 01/23/2023 0829   AST 27 01/23/2023 0829   ALT 24 01/23/2023 0829   BILITOT 0.3 01/23/2023 0829       Impression and Plan: Christopher Henson is a very nice  42 year old African-American male.  He has what certainly is acting like a large cell neuroendocrine carcinoma.  As such, we will have to get the protocols for non-small cell lung cancer.  Hopefully the protocol that we have him on now will help.    He has a history of HIV.  This appears to be under very good control.  I know that Infectious Disease saw him in the hospital and are trying to help  manage any complications from the HIV that may occur with his treatment.  I would like to hope that with treatment and with success, the pleural effusion will resolve and that he will be able to have the Pleurx catheter removed.  Nutritions can be critical.  Hopefully, he will be able to eat a little bit better.  Again I am still not sure as to why he is having all the issues with Medicaid and pain.  We will have to have our social worker try to help with this.  We will plan to get him back to see [place] in another 3 weeks.  This will be his second cycle of treatment.   Josph Macho, MD 10/15/20249:29 AM

## 2023-01-23 NOTE — Progress Notes (Signed)
Patient's treatment regimen will be modified based on clinical presentation. He now has a pleurex to manage his pleural effusions.   Chemo educator will follow up with patient regarding his changed regimen.   Food bag from our pantry provided to patient.   Oncology Nurse Navigator Documentation     01/23/2023   10:00 AM  Oncology Nurse Navigator Flowsheets  Navigator Follow Up Date: 02/07/2023  Navigator Follow Up Reason: Follow-up Appointment;Chemotherapy  Navigator Location [facility]  Navigator Encounter Type Appt/Treatment Plan Review  Patient Visit Type MedOnc  Treatment Phase Active Tx  Barriers/Navigation Needs Coordination of Care;Education  Interventions None Required  Acuity Level 2-Minimal Needs (1-2 Barriers Identified)  Time Spent with Patient 15

## 2023-01-23 NOTE — Patient Instructions (Signed)
[facility]  Discharge Instructions: Thank you for choosing [facility] to provide your oncology and hematology care.   If you have a lab appointment with the Cancer Center, please go directly to the Cancer Center and check in at the registration area.  Wear comfortable clothing and clothing appropriate for easy access to any Portacath or PICC line.   We strive to give you quality time with your provider. You may need to reschedule your appointment if you arrive late (15 or more minutes).  Arriving late affects you and other patients whose appointments are after yours.  Also, if you miss three or more appointments without notifying the office, you may be dismissed from the clinic at the provider's discretion.      For prescription refill requests, have your pharmacy contact our office and allow 72 hours for refills to be completed.    Today you received the following chemotherapy and/or immunotherapy agents Tecentriq, Carboplatin, Bevacizumab-xxxx, and Paclitaxel      To help prevent nausea and vomiting after your treatment, we encourage you to take your nausea medication as directed.  BELOW ARE SYMPTOMS THAT SHOULD BE REPORTED IMMEDIATELY: *FEVER GREATER THAN 100.4 F (38 C) OR HIGHER *CHILLS OR SWEATING *NAUSEA AND VOMITING THAT IS NOT CONTROLLED WITH YOUR NAUSEA MEDICATION *UNUSUAL SHORTNESS OF BREATH *UNUSUAL BRUISING OR BLEEDING *URINARY PROBLEMS (pain or burning when urinating, or frequent urination) *BOWEL PROBLEMS (unusual diarrhea, constipation, pain near the anus) TENDERNESS IN MOUTH AND THROAT WITH OR WITHOUT PRESENCE OF ULCERS (sore throat, sores in mouth, or a toothache) UNUSUAL RASH, SWELLING OR PAIN  UNUSUAL VAGINAL DISCHARGE OR ITCHING   Items with * indicate a potential emergency and should be followed up as soon as possible or go to the Emergency Department if any problems should occur.  Please show the CHEMOTHERAPY  ALERT CARD or IMMUNOTHERAPY ALERT CARD at check-in to the Emergency Department and triage nurse. Should you have questions after your visit or need to cancel or reschedule your appointment, please contact [facility]  [phone] and follow the prompts.  Office hours are 8:00 a.m. to 4:30 p.m. Monday - Friday. Please note that voicemails left after 4:00 p.m. may not be returned until the following business day.  We are closed weekends and major holidays. You have access to a nurse at all times for urgent questions. Please call the main number to the clinic [phone] and follow the prompts.  For any non-urgent questions, you may also contact your provider using MyChart. We now offer e-Visits for anyone 60 and older to request care online for non-urgent symptoms. For details visit [email or web address].   Also download the MyChart app! Go to the app store, search "MyChart", open the app, select [facility], and log in with your MyChart username and password.

## 2023-01-23 NOTE — Patient Instructions (Signed)

## 2023-01-24 ENCOUNTER — Other Ambulatory Visit: Payer: Self-pay

## 2023-01-24 ENCOUNTER — Inpatient Hospital Stay: Payer: 59

## 2023-01-24 ENCOUNTER — Inpatient Hospital Stay: Payer: Medicaid Other | Admitting: Dietician

## 2023-01-24 ENCOUNTER — Ambulatory Visit: Payer: 59 | Admitting: Internal Medicine

## 2023-01-24 ENCOUNTER — Telehealth: Payer: Self-pay

## 2023-01-24 LAB — T4: T4, Total: 5.3 ug/dL (ref 4.5–12.0)

## 2023-01-24 NOTE — Progress Notes (Signed)
Nutrition Follow Up: Reached out to patient at home/cell telephone number.    Patient continues to deny any NIS at this time, last BM 2 days ago.Marland Kitchen  He is only eating one meal/day now mainly due to food availability.  Received bag of food from cancer center yesterday.  He states wasn't able to access some resources because he was told he needed a voucher.  He is still getting his Marinol and has another refill however pain meds are not being covered by his insurance. Fluids: juice and tea when he can get it, mostly water. Not sure if connection poor or voice muffled, had tough time communicating/hearing full conversation.  Nutrition Focused Physical Exam: unable to perform NFPE   Medications: Marinol ordered waiting for approval   Labs: 01/23/23  Albumin 3.3, Hgb 12.4   Anthropometrics: weight rebounded with small gain  Height: 72" Weight:  01/23/23  172# 01/10/23  166.7# 12/12/22  182.9# UBW: 175-180# BMI: 22.62   Estimated Energy Needs  Kcals: 2250-2625 Protein: 90-113 g Fluid: 2.5   NUTRITION DIAGNOSIS: Inadequate intake r/t availability of food and food insecurity. Improving LCSW working with patient    INTERVENTION:   Encouraged use of legumes as protein source.  Encouraged follow up with LCSW for more resources and to follow up on questions regarding resources and food vouchers.. Encouraged him to reach out with questions if necessary,  MONITORING, EVALUATION, GOAL: weight trends, nutrition impact symptoms, PO intake, labs   Next Visit: PRN at patient or provider request  Gennaro Africa, RDN, LDN Registered Dietitian, [facility] Part Time Remote (Usual office hours: Tuesday-Thursday) Mobile: [phone] Remote Office: [phone]

## 2023-01-24 NOTE — Telephone Encounter (Signed)
Notified Patient of prior authorization approval for Fentanyl 25 mcg Patches. Medication is approved through 04/26/2023. No other needs or concerns voiced at this time.

## 2023-01-25 ENCOUNTER — Encounter: Payer: Self-pay | Admitting: Licensed Clinical Social Worker

## 2023-01-25 ENCOUNTER — Telehealth: Payer: Self-pay | Admitting: Licensed Clinical Social Worker

## 2023-01-25 ENCOUNTER — Inpatient Hospital Stay: Payer: 59

## 2023-01-25 VITALS — BP 142/100 | HR 82 | Temp 98.0°F | Resp 17

## 2023-01-25 DIAGNOSIS — C7A1 Malignant poorly differentiated neuroendocrine tumors: Secondary | ICD-10-CM

## 2023-01-25 DIAGNOSIS — Z5112 Encounter for antineoplastic immunotherapy: Secondary | ICD-10-CM | POA: Diagnosis not present

## 2023-01-25 MED ORDER — PEGFILGRASTIM-CBQV 6 MG/0.6ML ~~LOC~~ SOSY
6.0000 mg | PREFILLED_SYRINGE | Freq: Once | SUBCUTANEOUS | Status: AC
  Administered 2023-01-25: 6 mg via SUBCUTANEOUS
  Filled 2023-01-25: qty 0.6

## 2023-01-25 NOTE — Patient Outreach (Signed)
Care Coordination   01/25/2023 Name: Christopher Henson MRN: [id number] DOB: 1980-08-17   Care Coordination Outreach Attempts:  An unsuccessful telephone outreach was attempted for a scheduled appointment today.  Follow Up Plan:  Additional outreach attempts will be made to offer the patient care coordination information and services.   Encounter Outcome:  No Answer   Care Coordination Interventions:  No, not indicated    Jenel Lucks, MSW, LCSW [facility] Care Management [place]  [facility] [email or web address] Phone [phone] 3:37 PM

## 2023-01-25 NOTE — Patient Instructions (Signed)

## 2023-01-25 NOTE — Progress Notes (Signed)
[facility] CSW Progress Note  Clinical Child psychotherapist contacted patient by phone to explore community resources.  CSW securely emailed patient food pantry locations, the website to apply for food stamps and a W9 for his sister to fill out.  She will also write a letter or a lease to confirm his living arrangements for a possible grant.  Patient stated the Medicaid transportation has been unreliable.  CSW to complete a transportation application through the [employer].    Dorothey Baseman, LCSW Clinical Social Worker [facility]

## 2023-01-29 ENCOUNTER — Emergency Department (HOSPITAL_COMMUNITY): Payer: Medicaid Other

## 2023-01-29 ENCOUNTER — Encounter: Payer: Self-pay | Admitting: Hematology & Oncology

## 2023-01-29 ENCOUNTER — Emergency Department (HOSPITAL_COMMUNITY)
Admission: EM | Admit: 2023-01-29 | Discharge: 2023-01-29 | Disposition: A | Discharge status: Home / Self Care | Payer: Medicaid Other | Attending: Emergency Medicine | Admitting: Emergency Medicine

## 2023-01-29 ENCOUNTER — Other Ambulatory Visit: Payer: Self-pay

## 2023-01-29 ENCOUNTER — Encounter (HOSPITAL_COMMUNITY): Payer: Self-pay

## 2023-01-29 DIAGNOSIS — R0600 Dyspnea, unspecified: Secondary | ICD-10-CM | POA: Insufficient documentation

## 2023-01-29 DIAGNOSIS — Z85118 Personal history of other malignant neoplasm of bronchus and lung: Secondary | ICD-10-CM | POA: Diagnosis not present

## 2023-01-29 DIAGNOSIS — R0602 Shortness of breath: Secondary | ICD-10-CM | POA: Insufficient documentation

## 2023-01-29 DIAGNOSIS — R1012 Left upper quadrant pain: Secondary | ICD-10-CM | POA: Diagnosis not present

## 2023-01-29 LAB — LIPASE, BLOOD: Lipase: 24 U/L (ref 11–51)

## 2023-01-29 LAB — COMPREHENSIVE METABOLIC PANEL
ALT: 32 U/L (ref 0–44)
AST: 30 U/L (ref 15–41)
Albumin: 3.4 g/dL — ABNORMAL LOW (ref 3.5–5.0)
Alkaline Phosphatase: 136 U/L — ABNORMAL HIGH (ref 38–126)
Anion gap: 9 (ref 5–15)
BUN: 12 mg/dL (ref 6–20)
CO2: 27 mmol/L (ref 22–32)
Calcium: 9.3 mg/dL (ref 8.9–10.3)
Chloride: 99 mmol/L (ref 98–111)
Creatinine, Ser: 1 mg/dL (ref 0.61–1.24)
GFR, Estimated: >60 mL/min (ref >60)
Glucose, Bld: 104 mg/dL — ABNORMAL HIGH (ref 70–99)
Potassium: 3.9 mmol/L (ref 3.5–5.1)
Sodium: 135 mmol/L (ref 135–145)
Total Bilirubin: 0.5 mg/dL (ref 0.3–1.2)
Total Protein: 7.1 g/dL (ref 6.5–8.1)

## 2023-01-29 LAB — CBC WITH DIFFERENTIAL/PLATELET
Abs Immature Granulocytes: 0.51 10*3/uL — ABNORMAL HIGH (ref 0.00–0.07)
Basophils Absolute: 0.1 10*3/uL (ref 0.0–0.1)
Basophils Relative: 0 %
Eosinophils Absolute: 0.4 10*3/uL (ref 0.0–0.5)
Eosinophils Relative: 2 %
HCT: 39.5 % (ref 39.0–52.0)
Hemoglobin: 14 g/dL (ref 13.0–17.0)
Immature Granulocytes: 2 %
Lymphocytes Relative: 9 %
Lymphs Abs: 2.1 10*3/uL (ref 0.7–4.0)
MCH: 31.1 pg (ref 26.0–34.0)
MCHC: 35.4 g/dL (ref 30.0–36.0)
MCV: 87.8 fL (ref 80.0–100.0)
Monocytes Absolute: 1.8 10*3/uL — ABNORMAL HIGH (ref 0.1–1.0)
Monocytes Relative: 8 %
Neutro Abs: 18.4 10*3/uL — ABNORMAL HIGH (ref 1.7–7.7)
Neutrophils Relative %: 79 %
Platelets: 295 10*3/uL (ref 150–400)
RBC: 4.5 MIL/uL (ref 4.22–5.81)
RDW: 13.2 % (ref 11.5–15.5)
WBC: 23.3 10*3/uL — ABNORMAL HIGH (ref 4.0–10.5)
nRBC: 0 % (ref 0.0–0.2)

## 2023-01-29 MED ORDER — MORPHINE SULFATE (PF) 4 MG/ML IV SOLN
4.0000 mg | Freq: Once | INTRAVENOUS | Status: AC
  Administered 2023-01-29: 4 mg via INTRAVENOUS
  Filled 2023-01-29: qty 1

## 2023-01-29 MED ORDER — IOHEXOL 350 MG/ML SOLN
100.0000 mL | Freq: Once | INTRAVENOUS | Status: AC | PRN
  Administered 2023-01-29: 100 mL via INTRAVENOUS

## 2023-01-29 NOTE — ED Provider Notes (Signed)
[place] EMERGENCY DEPARTMENT AT [facility] Provider Note   CSN: [id number] Arrival date & time: 01/29/23  2536     History  No chief complaint on file.   Christerpher Kishi is a 42 y.o. male.  42 year old male with history of lung cancer, status post left chest drainage tube to 3 weeks ago presents with increasing shortness of breath as well as pain to his left upper quadrant.  States that he has had increasing dyspnea on exertion.  Denies any fever hemoptysis.  Notes that there has been no purulent drainage from his tube.  Notes there has been decreased drainage from his thoracostomy tube.  States the pain starts in his left flank and goes into his left upper quadrant.  Denies any urinary symptoms.  Has been using home opiates with limited relief.       Home Medications Prior to Admission medications   Medication Sig Start Date End Date Taking? Authorizing Provider  acetaminophen (TYLENOL) 325 MG tablet Take 2 tablets (650 mg total) by mouth every 6 (six) hours as needed. 01/17/23   Dorcas Carrow, MD  bictegravir-emtricitabine-tenofovir AF (BIKTARVY) 50-200-25 MG TABS tablet Take 1 tablet by mouth daily. Patient not taking: Reported on 01/13/2023 01/02/23   Daiva Eves, Lisette Grinder, MD  dexamethasone (DECADRON) 4 MG tablet Take 2 tablets (8mg ) by mouth daily starting the day after carboplatin for 3 days. Take with food 01/23/23   Josph Macho, MD  dronabinol (MARINOL) 5 MG capsule Take 1 capsule (5 mg total) by mouth 2 (two) times daily before a meal. Patient not taking: Reported on 01/13/2023 01/10/23   Josph Macho, MD  fentaNYL (DURAGESIC) 25 MCG/HR Place 1 patch onto the skin every 3 (three) days. 01/22/23 02/21/23  Josph Macho, MD  fluconazole (DIFLUCAN) 200 MG tablet Take 1 tablet (200 mg total) by mouth daily for 30 doses. Patient not taking: Reported on 01/13/2023 01/02/23 02/01/23  Daiva Eves, Lisette Grinder, MD  HYDROmorphone (DILAUDID) 4 MG tablet Take 1 tablet (4  mg total) by mouth every 6 (six) hours as needed for severe pain (pain score 7-10). 01/23/23   Josph Macho, MD  lidocaine-prilocaine (EMLA) cream Apply to affected area once 01/23/23   Josph Macho, MD  ondansetron (ZOFRAN) 8 MG tablet Take 1 tablet (8 mg total) by mouth every 8 (eight) hours as needed for nausea or vomiting. Start on the third day after carboplatin. 01/23/23   Josph Macho, MD  prochlorperazine (COMPAZINE) 10 MG tablet Take 1 tablet (10 mg total) by mouth every 6 (six) hours as needed for nausea or vomiting. 01/23/23   Josph Macho, MD  senna-docusate (SENOKOT-S) 8.6-50 MG tablet Take 1 tablet by mouth 2 (two) times daily between meals as needed for mild constipation. Patient not taking: Reported on 01/09/2023 01/03/23   Rhetta Mura, MD  sulfamethoxazole-trimethoprim (BACTRIM DS) 800-160 MG tablet Take 1 tablet by mouth daily. Patient not taking: Reported on 01/13/2023 01/02/23 02/01/23  Daiva Eves, Lisette Grinder, MD      Allergies    Patient has no known allergies.    Review of Systems   Review of Systems  All other systems reviewed and are negative.   Physical Exam Updated Vital Signs BP (!) 124/98 (BP Location: Right Arm)   Pulse (!) 110   Temp 98.9 F (37.2 C) (Oral)   Resp 16   Ht 1.829 m (6')   Wt 75.8 kg   SpO2 95%   BMI  22.65 kg/m  Physical Exam Vitals and nursing note reviewed.  Constitutional:      General: He is not in acute distress.    Appearance: Normal appearance. He is well-developed. He is not toxic-appearing.  HENT:     Head: Normocephalic and atraumatic.  Eyes:     General: Lids are normal.     Conjunctiva/sclera: Conjunctivae normal.     Pupils: Pupils are equal, round, and reactive to light.  Neck:     Thyroid: No thyroid mass.     Trachea: No tracheal deviation.  Cardiovascular:     Rate and Rhythm: Normal rate and regular rhythm.     Heart sounds: Normal heart sounds. No murmur heard.    No gallop.  Pulmonary:      Effort: Pulmonary effort is normal. No respiratory distress.     Breath sounds: Normal breath sounds. No stridor. No decreased breath sounds, wheezing, rhonchi or rales.  Abdominal:     General: There is no distension.     Palpations: Abdomen is soft.     Tenderness: There is abdominal tenderness in the left lower quadrant. There is no rebound.    Musculoskeletal:        General: No tenderness. Normal range of motion.     Cervical back: Normal range of motion and neck supple.  Skin:    General: Skin is warm and dry.     Findings: No abrasion or rash.  Neurological:     Mental Status: He is alert and oriented to person, place, and time. Mental status is at baseline.     GCS: GCS eye subscore is 4. GCS verbal subscore is 5. GCS motor subscore is 6.     Cranial Nerves: Cranial nerves are intact. No cranial nerve deficit.     Sensory: No sensory deficit.     Motor: Motor function is intact.  Psychiatric:        Attention and Perception: Attention normal.        Speech: Speech normal.        Behavior: Behavior normal.     ED Results / Procedures / Treatments   Labs (all labs ordered are listed, but only abnormal results are displayed) Labs Reviewed  CBC WITH DIFFERENTIAL/PLATELET  COMPREHENSIVE METABOLIC PANEL  LIPASE, BLOOD    EKG None  Radiology No results found.  Procedures Procedures    Medications Ordered in ED Medications  morphine (PF) 4 MG/ML injection 4 mg (has no administration in time range)    ED Course/ Medical Decision Making/ A&P                                 Medical Decision Making Amount and/or Complexity of Data Reviewed Labs: ordered. Radiology: ordered.  Risk Prescription drug management.   Patient with elevated leukocytosis of his CBC.  Patient has received medication for white cell stimulation during his chemotherapy.  He has no evidence of sepsis at this time.  Patient has indwelling chest drainage tube and concern for possible  reaccumulation of fluid.  Also concern for possible infection.  His chest x-ray Showed a decrease size of the pleural effusion.  Subsequently concern for possible PE as patient has had increasing dyspnea on exertion.  Patient has normal hemoglobin therefore no concern for anemia.  Chest CT per my review interpretation showed no evidence of blood clot.  Patient relayed to me that he occasionally does use oxygen and  does have his oxygen canister at the bedside.  Of note, patient's chest CT shows known mass in his left chest.  Patient otherwise feels okay and be discharged home to follow-up with his oncology team       Final Clinical Impression(s) / ED Diagnoses Final diagnoses:  None    Rx / DC Orders ED Discharge Orders     None         Lorre Nick, MD 01/29/23 1340

## 2023-01-29 NOTE — ED Notes (Signed)
Pt discharged home. Discharge information discussed. No s/s of distress observed during discharge. 

## 2023-01-29 NOTE — ED Triage Notes (Signed)
Pt arrived pov c/o stomach pain x few hours. 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 No other symptoms reported

## 2023-02-01 ENCOUNTER — Inpatient Hospital Stay: Payer: 59

## 2023-02-01 LAB — ACID FAST CULTURE WITH REFLEXED SENSITIVITIES (MYCOBACTERIA): Acid Fast Culture: NEGATIVE

## 2023-02-01 NOTE — Progress Notes (Signed)
[facility] CSW Progress Note  Clinical Child psychotherapist contacted patient by phone to discuss grant status.  Patient stated he emailed CSW the W9 from his sister, but it was not received.  He will try to send it again.  He will mail it if it does not come through.    Dorothey Baseman, LCSW Clinical Social Worker [facility]    Patient is participating in a Managed Medicaid Plan:  Yes

## 2023-02-04 LAB — ACID FAST CULTURE WITH REFLEXED SENSITIVITIES (MYCOBACTERIA)
Acid Fast Culture: NEGATIVE
Acid Fast Culture: NEGATIVE

## 2023-02-05 ENCOUNTER — Other Ambulatory Visit: Payer: Self-pay

## 2023-02-05 DIAGNOSIS — C3492 Malignant neoplasm of unspecified part of left bronchus or lung: Secondary | ICD-10-CM

## 2023-02-05 MED ORDER — HYDROMORPHONE HCL 4 MG PO TABS: 4.0000 mg | ORAL_TABLET | Freq: Four times a day (QID) | ORAL | 0 refills | Status: DC | PRN

## 2023-02-07 ENCOUNTER — Inpatient Hospital Stay: Payer: Medicaid Other

## 2023-02-07 ENCOUNTER — Inpatient Hospital Stay: Payer: 59

## 2023-02-07 ENCOUNTER — Inpatient Hospital Stay: Payer: Medicaid Other | Admitting: Hematology & Oncology

## 2023-02-08 ENCOUNTER — Inpatient Hospital Stay: Payer: Medicaid Other

## 2023-02-09 ENCOUNTER — Telehealth: Payer: Self-pay | Admitting: *Deleted

## 2023-02-09 ENCOUNTER — Inpatient Hospital Stay: Payer: 59

## 2023-02-09 ENCOUNTER — Encounter: Payer: Self-pay | Admitting: Hematology & Oncology

## 2023-02-09 NOTE — Progress Notes (Signed)
Christopher Henson approved pt for the $1000 [employer].  I will notify pt of his grant approval and go over what the grant covers and give him an expense sheet.

## 2023-02-09 NOTE — Telephone Encounter (Signed)
Call received from patient requesting a refill for Oxycodone. Pt states that he is not using the Fentanyl 25 mcg patch and is taking half of the Dilaudid 4 mg tablet every 6 hrs as needed.  Dr. [name] notified.  Call placed back to patient and patient notified per order of Dr. [name] that he will not send in a prescription for Oxycodone at this time and to continue with the Dilaudid and Fentanyl patch as prescribed.  Teach back done.  Pt is appreciative of call back and has no questions at this time.

## 2023-02-12 ENCOUNTER — Inpatient Hospital Stay: Payer: 59

## 2023-02-13 ENCOUNTER — Inpatient Hospital Stay: Payer: 59

## 2023-02-13 ENCOUNTER — Inpatient Hospital Stay (HOSPITAL_BASED_OUTPATIENT_CLINIC_OR_DEPARTMENT_OTHER): Payer: 59 | Admitting: Hematology & Oncology

## 2023-02-13 ENCOUNTER — Inpatient Hospital Stay: Payer: 59 | Attending: Hematology & Oncology

## 2023-02-13 ENCOUNTER — Encounter: Payer: Self-pay | Admitting: Hematology & Oncology

## 2023-02-13 ENCOUNTER — Encounter: Payer: Self-pay | Admitting: *Deleted

## 2023-02-13 ENCOUNTER — Telehealth: Payer: Self-pay

## 2023-02-13 ENCOUNTER — Other Ambulatory Visit: Payer: Self-pay

## 2023-02-13 VITALS — BP 142/89 | HR 97 | Temp 98.2°F | Resp 18 | Ht 72.0 in | Wt 169.0 lb

## 2023-02-13 DIAGNOSIS — C7A1 Malignant poorly differentiated neuroendocrine tumors: Secondary | ICD-10-CM

## 2023-02-13 DIAGNOSIS — Z5112 Encounter for antineoplastic immunotherapy: Secondary | ICD-10-CM | POA: Diagnosis present

## 2023-02-13 DIAGNOSIS — Z5111 Encounter for antineoplastic chemotherapy: Secondary | ICD-10-CM | POA: Diagnosis present

## 2023-02-13 DIAGNOSIS — Z5189 Encounter for other specified aftercare: Secondary | ICD-10-CM | POA: Diagnosis not present

## 2023-02-13 DIAGNOSIS — Z21 Asymptomatic human immunodeficiency virus [HIV] infection status: Secondary | ICD-10-CM | POA: Insufficient documentation

## 2023-02-13 LAB — CMP (CANCER CENTER ONLY)
ALT: 9 U/L (ref 0–44)
AST: 12 U/L — ABNORMAL LOW (ref 15–41)
Albumin: 4.1 g/dL (ref 3.5–5.0)
Alkaline Phosphatase: 94 U/L (ref 38–126)
Anion gap: 7 (ref 5–15)
BUN: 9 mg/dL (ref 6–20)
CO2: 28 mmol/L (ref 22–32)
Calcium: 9.3 mg/dL (ref 8.9–10.3)
Chloride: 104 mmol/L (ref 98–111)
Creatinine: 1.02 mg/dL (ref 0.61–1.24)
GFR, Estimated: >60 mL/min (ref >60)
Glucose, Bld: 99 mg/dL (ref 70–99)
Potassium: 3.9 mmol/L (ref 3.5–5.1)
Sodium: 139 mmol/L (ref 135–145)
Total Bilirubin: 0.4 mg/dL (ref <1.2)
Total Protein: 7 g/dL (ref 6.5–8.1)

## 2023-02-13 LAB — CBC WITH DIFFERENTIAL (CANCER CENTER ONLY)
Abs Immature Granulocytes: 0.02 10*3/uL (ref 0.00–0.07)
Basophils Absolute: 0.1 10*3/uL (ref 0.0–0.1)
Basophils Relative: 1 %
Eosinophils Absolute: 0.1 10*3/uL (ref 0.0–0.5)
Eosinophils Relative: 2 %
HCT: 36.4 % — ABNORMAL LOW (ref 39.0–52.0)
Hemoglobin: 13.1 g/dL (ref 13.0–17.0)
Immature Granulocytes: 0 %
Lymphocytes Relative: 24 %
Lymphs Abs: 1.4 10*3/uL (ref 0.7–4.0)
MCH: 31.5 pg (ref 26.0–34.0)
MCHC: 36 g/dL (ref 30.0–36.0)
MCV: 87.5 fL (ref 80.0–100.0)
Monocytes Absolute: 0.9 10*3/uL (ref 0.1–1.0)
Monocytes Relative: 15 %
Neutro Abs: 3.3 10*3/uL (ref 1.7–7.7)
Neutrophils Relative %: 58 %
Platelet Count: 245 10*3/uL (ref 150–400)
RBC: 4.16 MIL/uL — ABNORMAL LOW (ref 4.22–5.81)
RDW: 14.4 % (ref 11.5–15.5)
WBC Count: 5.8 10*3/uL (ref 4.0–10.5)
nRBC: 0 % (ref 0.0–0.2)

## 2023-02-13 LAB — SAMPLE TO BLOOD BANK

## 2023-02-13 LAB — LACTATE DEHYDROGENASE: LDH: 583 U/L — ABNORMAL HIGH (ref 98–192)

## 2023-02-13 MED ORDER — HEPARIN SOD (PORK) LOCK FLUSH 100 UNIT/ML IV SOLN
500.0000 [IU] | Freq: Once | INTRAVENOUS | Status: AC | PRN
Start: 2023-02-13 — End: 2023-02-13
  Administered 2023-02-13: 500 [IU]

## 2023-02-13 MED ORDER — SODIUM CHLORIDE 0.9 % IV SOLN
150.0000 mg | Freq: Once | INTRAVENOUS | Status: AC
  Administered 2023-02-13: 150 mg via INTRAVENOUS
  Filled 2023-02-13: qty 150

## 2023-02-13 MED ORDER — SODIUM CHLORIDE 0.9 % IV SOLN
1200.0000 mg | Freq: Once | INTRAVENOUS | Status: AC
  Administered 2023-02-13: 1200 mg via INTRAVENOUS
  Filled 2023-02-13: qty 20

## 2023-02-13 MED ORDER — SODIUM CHLORIDE 0.9% FLUSH
10.0000 mL | INTRAVENOUS | Status: DC | PRN
Start: 2023-02-13 — End: 2023-02-13
  Administered 2023-02-13: 10 mL

## 2023-02-13 MED ORDER — DEXAMETHASONE SODIUM PHOSPHATE 10 MG/ML IJ SOLN
10.0000 mg | Freq: Once | INTRAMUSCULAR | Status: AC
  Administered 2023-02-13: 10 mg via INTRAVENOUS
  Filled 2023-02-13: qty 1

## 2023-02-13 MED ORDER — SODIUM CHLORIDE 0.9 % IV SOLN: INTRAVENOUS | Status: DC

## 2023-02-13 MED ORDER — PALONOSETRON HCL INJECTION 0.25 MG/5ML
0.2500 mg | Freq: Once | INTRAVENOUS | Status: AC
Start: 2023-02-13 — End: 2023-02-13
  Administered 2023-02-13: 0.25 mg via INTRAVENOUS
  Filled 2023-02-13: qty 5

## 2023-02-13 MED ORDER — SODIUM CHLORIDE 0.9 % IV SOLN: 10.0000 mg | Freq: Once | INTRAVENOUS | Status: DC

## 2023-02-13 MED ORDER — DIPHENHYDRAMINE HCL 50 MG/ML IJ SOLN
50.0000 mg | Freq: Once | INTRAMUSCULAR | Status: AC
  Administered 2023-02-13: 50 mg via INTRAVENOUS
  Filled 2023-02-13: qty 1

## 2023-02-13 MED ORDER — PACLITAXEL CHEMO INJECTION 300 MG/50ML
180.0000 mg/m2 | Freq: Once | INTRAVENOUS | Status: AC
  Administered 2023-02-13: 354 mg via INTRAVENOUS
  Filled 2023-02-13: qty 59

## 2023-02-13 MED ORDER — SODIUM CHLORIDE 0.9 % IV SOLN
15.0000 mg/kg | Freq: Once | INTRAVENOUS | Status: AC
  Administered 2023-02-13: 1100 mg via INTRAVENOUS
  Filled 2023-02-13: qty 32

## 2023-02-13 MED ORDER — SODIUM CHLORIDE 0.9 % IV SOLN
747.6000 mg | Freq: Once | INTRAVENOUS | Status: AC
  Administered 2023-02-13: 750 mg via INTRAVENOUS
  Filled 2023-02-13: qty 75

## 2023-02-13 MED ORDER — FAMOTIDINE IN NACL 20-0.9 MG/50ML-% IV SOLN
20.0000 mg | Freq: Once | INTRAVENOUS | Status: AC
  Administered 2023-02-13: 20 mg via INTRAVENOUS
  Filled 2023-02-13: qty 50

## 2023-02-13 NOTE — Progress Notes (Signed)
Patient will proceed with second cycle of current plan (third overall). Will need scans to determine response.   Oncology Nurse Navigator Documentation     02/13/2023    8:30 AM  Oncology Nurse Navigator Flowsheets  Navigator Follow Up Date: 03/07/2023  Navigator Follow Up Reason: Scan Review  Navigator Location [facility]  Navigator Encounter Type Treatment;Appt/Treatment Plan Review  Patient Visit Type MedOnc  Treatment Phase Active Tx  Barriers/Navigation Needs Coordination of Care;Education  Interventions Psycho-Social Support  Acuity Level 2-Minimal Needs (1-2 Barriers Identified)  Time Spent with Patient 15

## 2023-02-13 NOTE — Telephone Encounter (Signed)
Patient will be out of pleur-x dressing kits in the next week. States he doesn't have anymore and hasn't heard from anyone regarding an order. Per social worker note on 10/9, pleur-x supplies were ordered through [employer]. Called [employer], they did not have an order on file for patient but were able to make him a file and place an order today. They expedited it so he should receive a call regarding shipping within the next day or so. Patient aware and was given the phone number for [facility] supply to reorder if needed or call if he doesn't hear anything about shipping by Thursday.   [facility]: [phone]

## 2023-02-13 NOTE — Progress Notes (Signed)
Hematology and Oncology Follow Up Visit  Christopher Henson [id number] 1980-07-17 42 y.o. 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  He actually does not look all that bad.  He says he is eating.  More importantly, is the fact that he says that there is not much fluid coming out of the Pleurx catheter that is in the left pleural space.  Hopefully, this is a good sign that the chemotherapy is working.  He is still having some pain.  A lot of the pain is because of the Pleurx catheter.  He has had no fever.  He has had no mouth sores.  There is been no diarrhea.  He is may have little to constipation.  There is been no leg swelling.  He has had no bleeding.  He has had no headache.  Overall, I will have to say that his performance status is probably ECOG 1.   Medications:  Current Outpatient Medications:    oxyCODONE (OXY IR/ROXICODONE) 5 MG immediate release tablet, Take 5 mg by mouth every 6 (six) hours as needed., Disp: , Rfl:    acetaminophen (TYLENOL) 325 MG tablet, Take 2 tablets (650 mg total) by mouth every 6 (six) hours as needed., Disp: , Rfl:    bictegravir-emtricitabine-tenofovir AF (BIKTARVY) 50-200-25 MG TABS tablet, Take 1 tablet by mouth daily. (Patient not taking: Reported on 01/13/2023), Disp: 30 tablet, Rfl: 0   dexamethasone (DECADRON) 4 MG tablet, Take 2 tablets (8mg ) by mouth daily starting the day after carboplatin for 3 days. Take with food, Disp: 30 tablet, Rfl: 1   dronabinol (MARINOL) 5 MG capsule, Take 1 capsule (5 mg total) by mouth 2 (two) times daily before a meal. (Patient not taking: Reported on 01/13/2023), Disp: 60 capsule, Rfl: 0   fentaNYL  (DURAGESIC) 25 MCG/HR, Place 1 patch onto the skin every 3 (three) days., Disp: 10 patch, Rfl: 0   HYDROmorphone (DILAUDID) 4 MG tablet, Take 1 tablet (4 mg total) by mouth every 6 (six) hours as needed for severe pain (pain score 7-10)., Disp: 90 tablet, Rfl: 0   lidocaine-prilocaine (EMLA) cream, Apply to affected area once, Disp: 30 g, Rfl: 3   ondansetron (ZOFRAN) 8 MG tablet, Take 1 tablet (8 mg total) by mouth every 8 (eight) hours as needed for nausea or vomiting. Start on the third day after carboplatin., Disp: 30 tablet, Rfl: 1   prochlorperazine (COMPAZINE) 10 MG tablet, Take 1 tablet (10 mg total) by mouth every 6 (six) hours as needed for nausea or vomiting., Disp: 30 tablet, Rfl: 1   senna-docusate (SENOKOT-S) 8.6-50 MG tablet, Take 1 tablet by mouth 2 (two) times daily between meals as needed for mild constipation. (Patient not taking: Reported on 01/09/2023), Disp: , Rfl:   Allergies: No Known Allergies  Past Medical History, Surgical history, Social history, and Family History were reviewed and updated.  Review of Systems: Review of Systems  Constitutional: Negative.   HENT:  Negative.    Eyes: Negative.   Respiratory:  Positive for cough and shortness of breath.   Cardiovascular:  Positive for chest pain.  Gastrointestinal: Negative.   Endocrine: Negative.   Genitourinary: Negative.    Musculoskeletal: Negative.   Skin: Negative.  Neurological: Negative.   Hematological: Negative.   Psychiatric/Behavioral: Negative.      Physical Exam:  height is 6' (1.829 m) and weight is 169 lb (76.7 kg). His oral temperature is 98.2 F (36.8 C). His blood pressure is 142/89 (abnormal) and his pulse is 97. His respiration is 18 and oxygen saturation is 98%.   Wt Readings from Last 3 Encounters:  02/13/23 169 lb (76.7 kg)  01/29/23 167 lb (75.8 kg)  01/23/23 172 lb (78 kg)    Physical Exam Vitals reviewed.  HENT:     Head: Normocephalic and atraumatic.  Eyes:     Pupils:  Pupils are equal, round, and reactive to light.  Cardiovascular:     Rate and Rhythm: Normal rate and regular rhythm.     Heart sounds: Normal heart sounds.     Comments: Cardiac exam is tachycardic but regular.  There are no murmurs. Pulmonary:     Effort: Pulmonary effort is normal.     Breath sounds: Normal breath sounds.     Comments: His lungs sound relatively clear bilaterally.  Maybe a little bit decreased and breath sounds are on the left lower lung. Abdominal:     General: Bowel sounds are normal.     Palpations: Abdomen is soft.     Comments: Abdominal exam is soft.  He has good bowel sounds.  There is no fluid wave.  There is no palpable liver or spleen tip.  Musculoskeletal:        General: No tenderness or deformity. Normal range of motion.     Cervical back: Normal range of motion.  Lymphadenopathy:     Cervical: No cervical adenopathy.  Skin:    General: Skin is warm and dry.     Findings: No erythema or rash.  Neurological:     Mental Status: He is alert and oriented to person, place, and time.  Psychiatric:        Behavior: Behavior normal.        Thought Content: Thought content normal.        Judgment: Judgment normal.      Lab Results  Component Value Date   WBC 23.3 (H) 01/29/2023   HGB 14.0 01/29/2023   HCT 39.5 01/29/2023   MCV 87.8 01/29/2023   PLT 295 01/29/2023     Chemistry      Component Value Date/Time   NA 135 01/29/2023 0936   NA 143 02/25/2019 1041   K 3.9 01/29/2023 0936   CL 99 01/29/2023 0936   CO2 27 01/29/2023 0936   BUN 12 01/29/2023 0936   BUN 8 02/25/2019 1041   CREATININE 1.00 01/29/2023 0936   CREATININE 1.20 01/23/2023 0829   CREATININE 0.89 12/14/2022 1132      Component Value Date/Time   CALCIUM 9.3 01/29/2023 0936   ALKPHOS 136 (H) 01/29/2023 0936   AST 30 01/29/2023 0936   AST 27 01/23/2023 0829   ALT 32 01/29/2023 0936   ALT 24 01/23/2023 0829   BILITOT 0.5 01/29/2023 0936   BILITOT 0.3 01/23/2023 0829        Impression and Plan: Christopher Henson is a very nice 42 year old African-American male.  He has what certainly is acting like a large cell neuroendocrine carcinoma.  Hopefully, he is responding.  We will go with his second cycle of chemotherapy.  After this cycle, we will then repeat his scans.  I would like to push his third cycle back until after Thanksgiving.  I think this  way, he will be able to enjoy the Thanksgiving holiday.  Maybe, he will be able to gain a little bit of extra weight by giving him an extra week off.    Josph Macho, MD 11/5/20248:27 AM

## 2023-02-13 NOTE — Progress Notes (Signed)
Patient called regarding grant and food insecurity. Encouraged patient to utilize grant for expenses needed. Asked if he applied for food stamps and he states he cannot get them.  Advised patient I would reach out to social work to have them follow up with possible options. Message sent to social workers for follow up. He had a question regarding grant expense which was already submitted. Advised it can take up to 2 weeks for merchant to receive check. He then stated rent will be due again.   He has Lenise's number for any additional financial questions or concerns as well.

## 2023-02-13 NOTE — Patient Instructions (Signed)

## 2023-02-13 NOTE — Patient Instructions (Signed)
[facility] - A DEPT OF [facility]  Discharge Instructions: Thank you for choosing [facility] to provide your oncology and hematology care.   If you have a lab appointment with the Cancer Center, please go directly to the Cancer Center and check in at the registration area.  Wear comfortable clothing and clothing appropriate for easy access to any Portacath or PICC line.   We strive to give you quality time with your provider. You may need to reschedule your appointment if you arrive late (15 or more minutes).  Arriving late affects you and other patients whose appointments are after yours.  Also, if you miss three or more appointments without notifying the office, you may be dismissed from the clinic at the provider's discretion.      For prescription refill requests, have your pharmacy contact our office and allow 72 hours for refills to be completed.    Today you received the following chemotherapy and/or immunotherapy agents Taxol, Vegzelma, Carboplatin, Tecentriq       To help prevent nausea and vomiting after your treatment, we encourage you to take your nausea medication as directed.  BELOW ARE SYMPTOMS THAT SHOULD BE REPORTED IMMEDIATELY: *FEVER GREATER THAN 100.4 F (38 C) OR HIGHER *CHILLS OR SWEATING *NAUSEA AND VOMITING THAT IS NOT CONTROLLED WITH YOUR NAUSEA MEDICATION *UNUSUAL SHORTNESS OF BREATH *UNUSUAL BRUISING OR BLEEDING *URINARY PROBLEMS (pain or burning when urinating, or frequent urination) *BOWEL PROBLEMS (unusual diarrhea, constipation, pain near the anus) TENDERNESS IN MOUTH AND THROAT WITH OR WITHOUT PRESENCE OF ULCERS (sore throat, sores in mouth, or a toothache) UNUSUAL RASH, SWELLING OR PAIN  UNUSUAL VAGINAL DISCHARGE OR ITCHING   Items with * indicate a potential emergency and should be followed up as soon as possible or go to the Emergency Department if any problems should occur.  Please show the  CHEMOTHERAPY ALERT CARD or IMMUNOTHERAPY ALERT CARD at check-in to the Emergency Department and triage nurse. Should you have questions after your visit or need to cancel or reschedule your appointment, please contact [facility] - A DEPT OF [facility]  [phone] and follow the prompts.  Office hours are 8:00 a.m. to 4:30 p.m. Monday - Friday. Please note that voicemails left after 4:00 p.m. may not be returned until the following business day.  We are closed weekends and major holidays. You have access to a nurse at all times for urgent questions. Please call the main number to the clinic [phone] and follow the prompts.  For any non-urgent questions, you may also contact your provider using MyChart. We now offer e-Visits for anyone 53 and older to request care online for non-urgent symptoms. For details visit [email or web address].   Also download the MyChart app! Go to the app store, search "MyChart", open the app, select [place], and log in with your MyChart username and password.

## 2023-02-14 ENCOUNTER — Inpatient Hospital Stay: Payer: 59 | Admitting: Licensed Clinical Social Worker

## 2023-02-14 NOTE — Progress Notes (Signed)
[facility] CSW Progress Note  Clinical Child psychotherapist contacted patient by phone to discuss resources per the request of Orbie Hurst, Artist.  He is able to utilize the food pantry at the [facility].  He acknowledges receiving local food pantry information/locations.  He asked about receiving funds for miscellaneous expenses.  Informed him of the same restrictions the financial counselor provided.  He stated his phone interview for social security disability went well and he has already returned the required paperwork.  Encouraged him to contact the [facility] since they hadn't returned his call.    Dorothey Baseman, LCSW Clinical Social Worker [facility]

## 2023-02-15 ENCOUNTER — Inpatient Hospital Stay: Payer: 59

## 2023-02-15 VITALS — BP 140/94 | HR 98 | Temp 97.2°F | Resp 20

## 2023-02-15 DIAGNOSIS — Z5112 Encounter for antineoplastic immunotherapy: Secondary | ICD-10-CM | POA: Diagnosis not present

## 2023-02-15 DIAGNOSIS — C7A1 Malignant poorly differentiated neuroendocrine tumors: Secondary | ICD-10-CM

## 2023-02-15 MED ORDER — PEGFILGRASTIM-JMDB 6 MG/0.6ML ~~LOC~~ SOSY
6.0000 mg | PREFILLED_SYRINGE | Freq: Once | SUBCUTANEOUS | Status: AC
  Administered 2023-02-15: 6 mg via SUBCUTANEOUS
  Filled 2023-02-15: qty 0.6

## 2023-02-15 NOTE — Patient Instructions (Signed)

## 2023-02-23 ENCOUNTER — Other Ambulatory Visit: Payer: Self-pay

## 2023-02-23 MED ORDER — BICTEGRAVIR-EMTRICITAB-TENOFOV 50-200-25 MG PO TABS: 1.0000 | ORAL_TABLET | Freq: Every day | ORAL | 0 refills | Status: DC

## 2023-03-01 ENCOUNTER — Encounter: Payer: Self-pay | Admitting: Hematology & Oncology

## 2023-03-05 ENCOUNTER — Inpatient Hospital Stay: Payer: 59

## 2023-03-05 ENCOUNTER — Inpatient Hospital Stay: Payer: 59 | Admitting: Hematology & Oncology

## 2023-03-05 ENCOUNTER — Encounter: Payer: Self-pay | Admitting: Hematology & Oncology

## 2023-03-05 NOTE — Progress Notes (Deleted)
Subjective:    Patient ID: Christopher Henson, male    DOB: 1981/01/08, 42 y.o.   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   Thrombocytopenia (HCC) 06/2018    Past Surgical History:  Procedure Laterality Date   BIOPSY  12/21/2022   Procedure: BIOPSY;  Surgeon: Tomma Lightning, MD;  Location: [facility];  Service: Endoscopy;;   BRONCHIAL WASHINGS  12/21/2022   Procedure: BRONCHIAL WASHINGS;  Surgeon: Tomma Lightning, MD;  Location: [facility];  Service: Endoscopy;;   ENDOBRONCHIAL ULTRASOUND Left 12/21/2022   Procedure: ENDOBRONCHIAL ULTRASOUND;  Surgeon: Tomma Lightning, MD;  Location: [facility];  Service: Endoscopy;  Laterality: Left;  Endobronchial ultrasound, biopsy, need fluoroscopy   FINE NEEDLE ASPIRATION  12/21/2022   Procedure: FINE NEEDLE ASPIRATION (FNA) LINEAR;  Surgeon: Tomma Lightning, MD;  Location: [facility];  Service: Endoscopy;;   IR BONE MARROW BIOPSY & ASPIRATION  12/27/2022   IR IMAGING GUIDED PORT INSERTION  12/27/2022   IR LUMBAR PUNCTURE  12/22/2022   IR PERC PLEURAL DRAIN W/INDWELL CATH W/IMG GUIDE  01/15/2023   LUMBAR PUNCTURE  06/2018   during eval for meningitis    VIDEO BRONCHOSCOPY Left 12/21/2022   Procedure: VIDEO BRONCHOSCOPY WITH FLUORO, endobronchial ultrasound;  Surgeon: Tomma Lightning, MD;  Location: [facility];  Service: Endoscopy;  Laterality: Left;  Schedule for 12/20/2022 12/21/2022    Family History  Problem Relation Age of Onset   Diabetes Mother    Hypertension Mother    Diabetes Father    Hypertension Father    Healthy Sister    Healthy Brother    Healthy Sister     Healthy Sister    Healthy Sister    Healthy Brother    Healthy Brother    Heart disease Neg Hx    Cancer Neg Hx    Stroke Neg Hx       Social History   Socioeconomic History   Marital status: Single    Spouse name: Not on file   Number of children: Not on file   Years of education: Not on file   Highest education level: Not on file  Occupational History   Not on file  Tobacco Use   Smoking status: Former    Current packs/day: 0.00    Types: Cigarettes    Quit date: 12/18/2022    Years since quitting: 0.2   Smokeless tobacco: Never  Vaping Use   Vaping status: Former  Substance and Sexual Activity   Alcohol use: Yes    Comment: 1/2 pint on weekend ; liquor   Drug use: Yes    Frequency: 7.0 times per week    Types: Marijuana    Comment: daily   Sexual activity: Not Currently    Partners: Female    Birth control/protection: Condom    Comment: declined condoms 06/08/22  Other Topics Concern   Not on file  Social History Narrative   ** Merged History Encounter **       Lives with sister and her kids.   Works at [employer].  Does physical labor on the job.   Has 23yo  son in [place].   12/2018   Social Determinants of Health   Financial Resource Strain: Medium Risk (01/16/2023)   Overall Financial Resource Strain (CARDIA)    Difficulty of Paying Living Expenses: Somewhat hard  Food Insecurity: No Food Insecurity (01/14/2023)   Hunger Vital Sign    Worried About Running Out of Food in the Last Year: Never true    Ran Out of Food in the Last Year: Never true  Transportation Needs: No Transportation Needs (01/14/2023)   PRAPARE - Administrator, Civil Service (Medical): No    Lack of Transportation (Non-Medical): No  Physical Activity: Inactive (01/10/2023)   Exercise Vital Sign    Days of Exercise per Week: 0 days    Minutes of Exercise per Session: 0 min  Stress: Stress Concern Present (01/10/2023)   [employer] of Occupational Health -  Occupational Stress Questionnaire    Feeling of Stress : Rather much  Social Connections: Socially Isolated (01/10/2023)   Social Connection and Isolation Panel [NHANES]    Frequency of Communication with Friends and Family: More than three times a week    Frequency of Social Gatherings with Friends and Family: More than three times a week    Attends Religious Services: Never    Database administrator or Organizations: No    Attends Banker Meetings: Never    Marital Status: Separated    No Known Allergies   Current Outpatient Medications:    acetaminophen (TYLENOL) 325 MG tablet, Take 2 tablets (650 mg total) by mouth every 6 (six) hours as needed., Disp: , Rfl:    bictegravir-emtricitabine-tenofovir AF (BIKTARVY) 50-200-25 MG TABS tablet, Take 1 tablet by mouth daily., Disp: 30 tablet, Rfl: 0   dexamethasone (DECADRON) 4 MG tablet, Take 2 tablets (8mg ) by mouth daily starting the day after carboplatin for 3 days. Take with food, Disp: 30 tablet, Rfl: 1   dronabinol (MARINOL) 5 MG capsule, Take 1 capsule (5 mg total) by mouth 2 (two) times daily before a meal. (Patient not taking: Reported on 01/13/2023), Disp: 60 capsule, Rfl: 0   HYDROmorphone (DILAUDID) 4 MG tablet, Take 1 tablet (4 mg total) by mouth every 6 (six) hours as needed for severe pain (pain score 7-10)., Disp: 90 tablet, Rfl: 0   lidocaine-prilocaine (EMLA) cream, Apply to affected area once, Disp: 30 g, Rfl: 3   ondansetron (ZOFRAN) 8 MG tablet, Take 1 tablet (8 mg total) by mouth every 8 (eight) hours as needed for nausea or vomiting. Start on the third day after carboplatin., Disp: 30 tablet, Rfl: 1   oxyCODONE (OXY IR/ROXICODONE) 5 MG immediate release tablet, Take 5 mg by mouth every 6 (six) hours as needed., Disp: , Rfl:    prochlorperazine (COMPAZINE) 10 MG tablet, Take 1 tablet (10 mg total) by mouth every 6 (six) hours as needed for nausea or vomiting., Disp: 30 tablet, Rfl: 1   senna-docusate (SENOKOT-S)  8.6-50 MG tablet, Take 1 tablet by mouth 2 (two) times daily between meals as needed for mild constipation. (Patient not taking: Reported on 01/09/2023), Disp: , Rfl:    Review of Systems     Objective:   Physical Exam        Assessment & Plan:

## 2023-03-06 ENCOUNTER — Ambulatory Visit: Payer: Medicaid Other | Admitting: Infectious Disease

## 2023-03-06 DIAGNOSIS — B451 Cerebral cryptococcosis: Secondary | ICD-10-CM

## 2023-03-06 DIAGNOSIS — Z7185 Encounter for immunization safety counseling: Secondary | ICD-10-CM

## 2023-03-06 DIAGNOSIS — B20 Human immunodeficiency virus [HIV] disease: Secondary | ICD-10-CM

## 2023-03-06 DIAGNOSIS — C7A1 Malignant poorly differentiated neuroendocrine tumors: Secondary | ICD-10-CM

## 2023-03-07 ENCOUNTER — Ambulatory Visit (HOSPITAL_COMMUNITY): Admission: RE | Admit: 2023-03-07 | Payer: 59 | Source: Ambulatory Visit

## 2023-03-07 ENCOUNTER — Inpatient Hospital Stay: Payer: 59

## 2023-03-09 ENCOUNTER — Encounter: Payer: Self-pay | Admitting: Hematology & Oncology

## 2023-03-12 ENCOUNTER — Encounter: Payer: Self-pay | Admitting: Hematology & Oncology

## 2023-03-12 ENCOUNTER — Encounter: Payer: Self-pay | Admitting: *Deleted

## 2023-03-12 NOTE — Progress Notes (Signed)
Changed Vegzelma to Mvasi per insurance preferred biosimilar.  Anola Gurney [place], [place], BCPS, BCOP 03/12/2023 3:51 PM

## 2023-03-12 NOTE — Progress Notes (Signed)
Patient was a no-show to his CT scan. He ideally needs this prior to his appointment on Wednesday.  Called patient. He stated he wasn't feeling well so he didn't come to his appointment. He is aware of the MD/Treatment appointment here on Wednesday. He would like to have his CT prior to his next treatment. He took the number to centralized scheduling with hopes that they could get him an appointment tomorrow.   Oncology Nurse Navigator Documentation     03/12/2023    2:15 PM  Oncology Nurse Navigator Flowsheets  Navigator Follow Up Date: 03/14/2023  Navigator Follow Up Reason: Follow-up Appointment;Chemotherapy  Financial risk analyst Encounter Type Telephone  Patient Visit Type MedOnc  Treatment Phase Active Tx  Barriers/Navigation Needs Coordination of Care;Education  Education Other  Interventions Education;Psycho-Social Support  Acuity Level 2-Minimal Needs (1-2 Barriers Identified)  Education Method Verbal  Time Spent with Patient 15

## 2023-03-14 ENCOUNTER — Inpatient Hospital Stay: Payer: 59

## 2023-03-14 ENCOUNTER — Encounter: Payer: Self-pay | Admitting: Hematology & Oncology

## 2023-03-14 ENCOUNTER — Inpatient Hospital Stay: Payer: 59 | Admitting: Hematology & Oncology

## 2023-03-16 ENCOUNTER — Ambulatory Visit (HOSPITAL_COMMUNITY)
Admission: RE | Admit: 2023-03-16 | Discharge: 2023-03-16 | Disposition: A | Discharge status: Home / Self Care | Payer: 59 | Source: Ambulatory Visit | Attending: Hematology & Oncology | Admitting: Hematology & Oncology

## 2023-03-16 DIAGNOSIS — C7A1 Malignant poorly differentiated neuroendocrine tumors: Secondary | ICD-10-CM | POA: Insufficient documentation

## 2023-03-16 MED ORDER — IOHEXOL 300 MG/ML  SOLN
100.0000 mL | Freq: Once | INTRAMUSCULAR | Status: AC | PRN
  Administered 2023-03-16: 100 mL via INTRAVENOUS

## 2023-03-20 ENCOUNTER — Telehealth: Payer: Self-pay | Admitting: Pulmonary Disease

## 2023-03-20 ENCOUNTER — Encounter: Payer: Self-pay | Admitting: Hematology & Oncology

## 2023-03-20 ENCOUNTER — Encounter: Payer: Self-pay | Admitting: *Deleted

## 2023-03-20 NOTE — Telephone Encounter (Signed)
Atc to call [name] no answer, lvmm   Pt has not been seen in office and I can not find when Dr.O bronch this pt

## 2023-03-20 NOTE — Telephone Encounter (Signed)
Calling from Dr Gustavo Lah office. Needing Gardent order for molecular testing. States patient had bronh with AO at some point. Please advise. Marking as urgent per their request. [phone] is the fax number if we can just fax the order as well.

## 2023-03-20 NOTE — Progress Notes (Signed)
Reviewed CT which shows treatment response.  Oncology Nurse Navigator Documentation     03/20/2023    7:30 AM  Oncology Nurse Navigator Flowsheets  Navigator Follow Up Date: 03/21/2023  Navigator Follow Up Reason: Follow-up Appointment;Chemotherapy  Navigator Location [facility]  Navigator Encounter Type Scan Review  Patient Visit Type MedOnc  Treatment Phase Active Tx  Barriers/Navigation Needs Coordination of Care;Education  Interventions None Required  Acuity Level 2-Minimal Needs (1-2 Barriers Identified)  Time Spent with Patient 15

## 2023-03-21 ENCOUNTER — Inpatient Hospital Stay (HOSPITAL_BASED_OUTPATIENT_CLINIC_OR_DEPARTMENT_OTHER): Payer: 59 | Admitting: Hematology & Oncology

## 2023-03-21 ENCOUNTER — Inpatient Hospital Stay: Payer: 59 | Attending: Hematology & Oncology

## 2023-03-21 ENCOUNTER — Inpatient Hospital Stay: Payer: 59 | Admitting: Licensed Clinical Social Worker

## 2023-03-21 ENCOUNTER — Encounter: Payer: Self-pay | Admitting: *Deleted

## 2023-03-21 ENCOUNTER — Other Ambulatory Visit: Payer: Self-pay | Admitting: *Deleted

## 2023-03-21 ENCOUNTER — Inpatient Hospital Stay: Payer: 59

## 2023-03-21 ENCOUNTER — Encounter: Payer: Self-pay | Admitting: Hematology & Oncology

## 2023-03-21 VITALS — BP 145/95 | HR 98

## 2023-03-21 VITALS — BP 141/90 | HR 116 | Temp 98.6°F | Resp 18 | Ht 72.0 in | Wt 175.5 lb

## 2023-03-21 DIAGNOSIS — Z5189 Encounter for other specified aftercare: Secondary | ICD-10-CM | POA: Insufficient documentation

## 2023-03-21 DIAGNOSIS — Z5112 Encounter for antineoplastic immunotherapy: Secondary | ICD-10-CM | POA: Diagnosis present

## 2023-03-21 DIAGNOSIS — C7A1 Malignant poorly differentiated neuroendocrine tumors: Secondary | ICD-10-CM

## 2023-03-21 DIAGNOSIS — Z5111 Encounter for antineoplastic chemotherapy: Secondary | ICD-10-CM | POA: Insufficient documentation

## 2023-03-21 DIAGNOSIS — Z21 Asymptomatic human immunodeficiency virus [HIV] infection status: Secondary | ICD-10-CM | POA: Insufficient documentation

## 2023-03-21 LAB — CBC WITH DIFFERENTIAL (CANCER CENTER ONLY)
Abs Immature Granulocytes: 0.14 10*3/uL — ABNORMAL HIGH (ref 0.00–0.07)
Basophils Absolute: 0.1 10*3/uL (ref 0.0–0.1)
Basophils Relative: 1 %
Eosinophils Absolute: 0 10*3/uL (ref 0.0–0.5)
Eosinophils Relative: 0 %
HCT: 38.5 % — ABNORMAL LOW (ref 39.0–52.0)
Hemoglobin: 13.7 g/dL (ref 13.0–17.0)
Immature Granulocytes: 1 %
Lymphocytes Relative: 14 %
Lymphs Abs: 1.9 10*3/uL (ref 0.7–4.0)
MCH: 32.2 pg (ref 26.0–34.0)
MCHC: 35.6 g/dL (ref 30.0–36.0)
MCV: 90.4 fL (ref 80.0–100.0)
Monocytes Absolute: 1.1 10*3/uL — ABNORMAL HIGH (ref 0.1–1.0)
Monocytes Relative: 8 %
Neutro Abs: 10 10*3/uL — ABNORMAL HIGH (ref 1.7–7.7)
Neutrophils Relative %: 76 %
Platelet Count: 323 10*3/uL (ref 150–400)
RBC: 4.26 MIL/uL (ref 4.22–5.81)
RDW: 17.8 % — ABNORMAL HIGH (ref 11.5–15.5)
WBC Count: 13.2 10*3/uL — ABNORMAL HIGH (ref 4.0–10.5)
nRBC: 0 % (ref 0.0–0.2)

## 2023-03-21 LAB — CMP (CANCER CENTER ONLY)
ALT: 10 U/L (ref 0–44)
AST: 22 U/L (ref 15–41)
Albumin: 4.4 g/dL (ref 3.5–5.0)
Alkaline Phosphatase: 99 U/L (ref 38–126)
Anion gap: 9 (ref 5–15)
BUN: 12 mg/dL (ref 6–20)
CO2: 28 mmol/L (ref 22–32)
Calcium: 9.8 mg/dL (ref 8.9–10.3)
Chloride: 106 mmol/L (ref 98–111)
Creatinine: 0.99 mg/dL (ref 0.61–1.24)
GFR, Estimated: >60 mL/min (ref >60)
Glucose, Bld: 97 mg/dL (ref 70–99)
Potassium: 3.6 mmol/L (ref 3.5–5.1)
Sodium: 143 mmol/L (ref 135–145)
Total Bilirubin: 0.3 mg/dL (ref <1.2)
Total Protein: 7.5 g/dL (ref 6.5–8.1)

## 2023-03-21 LAB — LACTATE DEHYDROGENASE: LDH: 1810 U/L — ABNORMAL HIGH (ref 98–192)

## 2023-03-21 MED ORDER — SODIUM CHLORIDE 0.9 % IV SOLN
1200.0000 mg | Freq: Once | INTRAVENOUS | Status: AC
  Administered 2023-03-21: 1200 mg via INTRAVENOUS
  Filled 2023-03-21: qty 20

## 2023-03-21 MED ORDER — DIPHENHYDRAMINE HCL 50 MG/ML IJ SOLN
50.0000 mg | Freq: Once | INTRAMUSCULAR | Status: AC
  Administered 2023-03-21: 50 mg via INTRAVENOUS
  Filled 2023-03-21: qty 1

## 2023-03-21 MED ORDER — HEPARIN SOD (PORK) LOCK FLUSH 100 UNIT/ML IV SOLN
500.0000 [IU] | Freq: Once | INTRAVENOUS | Status: DC | PRN
Start: 2023-03-21 — End: 2023-03-21

## 2023-03-21 MED ORDER — FAMOTIDINE IN NACL 20-0.9 MG/50ML-% IV SOLN
20.0000 mg | Freq: Once | INTRAVENOUS | Status: AC
  Administered 2023-03-21: 20 mg via INTRAVENOUS
  Filled 2023-03-21: qty 50

## 2023-03-21 MED ORDER — SODIUM CHLORIDE 0.9 % IV SOLN: INTRAVENOUS | Status: DC

## 2023-03-21 MED ORDER — PALONOSETRON HCL INJECTION 0.25 MG/5ML
0.2500 mg | Freq: Once | INTRAVENOUS | Status: AC
  Administered 2023-03-21: 0.25 mg via INTRAVENOUS
  Filled 2023-03-21: qty 5

## 2023-03-21 MED ORDER — SODIUM CHLORIDE 0.9 % IV SOLN
150.0000 mg | Freq: Once | INTRAVENOUS | Status: AC
  Administered 2023-03-21: 150 mg via INTRAVENOUS
  Filled 2023-03-21: qty 150

## 2023-03-21 MED ORDER — SODIUM CHLORIDE 0.9 % IV SOLN
180.0000 mg/m2 | Freq: Once | INTRAVENOUS | Status: AC
  Administered 2023-03-21: 354 mg via INTRAVENOUS
  Filled 2023-03-21: qty 59

## 2023-03-21 MED ORDER — SODIUM CHLORIDE 0.9 % IV SOLN
15.0000 mg/kg | Freq: Once | INTRAVENOUS | Status: AC
  Administered 2023-03-21: 1100 mg via INTRAVENOUS
  Filled 2023-03-21: qty 32

## 2023-03-21 MED ORDER — SODIUM CHLORIDE 0.9 % IV SOLN
765.6000 mg | Freq: Once | INTRAVENOUS | Status: AC
  Administered 2023-03-21: 770 mg via INTRAVENOUS
  Filled 2023-03-21: qty 77

## 2023-03-21 MED ORDER — SODIUM CHLORIDE 0.9% FLUSH: 10.0000 mL | INTRAVENOUS | Status: DC | PRN

## 2023-03-21 MED ORDER — LIDOCAINE-PRILOCAINE 2.5-2.5 % EX CREA: TOPICAL_CREAM | CUTANEOUS | 3 refills | Status: DC

## 2023-03-21 MED ORDER — OXYCODONE HCL 5 MG PO TABS: 5.0000 mg | ORAL_TABLET | Freq: Four times a day (QID) | ORAL | 0 refills | Status: DC | PRN

## 2023-03-21 MED ORDER — DEXAMETHASONE SODIUM PHOSPHATE 10 MG/ML IJ SOLN
10.0000 mg | Freq: Once | INTRAMUSCULAR | Status: AC
  Administered 2023-03-21: 10 mg via INTRAVENOUS
  Filled 2023-03-21: qty 1

## 2023-03-21 NOTE — Progress Notes (Signed)
[facility] CSW Progress Note  Visual merchandiser met with patient to assess needs.  He continues living with his sister.  His social security disability application is being processed.  He will obtain a bag of food from the pantry and a gas card from the [employer].  CSW provided active listening and supportive counseling.    Dorothey Baseman, LCSW Clinical Social Worker [facility]

## 2023-03-21 NOTE — Progress Notes (Signed)
OK to treat with HR of 116 per order of Dr. [name].

## 2023-03-21 NOTE — Patient Instructions (Signed)

## 2023-03-21 NOTE — Progress Notes (Signed)
Hematology and Oncology Follow Up Visit  Christopher Henson [id number] 1980-08-08 42 y.o. 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  He is doing fairly well.  He had a CT scan that was done on 03/16/2023.  Thankfully, this showed that he was responding.  His tumor measured 6.0 x 6.2 cm.  Previously, it measured 9.5 x 7.6 cm.  There is still pleural thickening and nodularity with out any obvious fluid.  He has no evidence of disease elsewhere.  He has stable left retroperitoneal lymph node of 1.3 x 1.3 cm.  He really wants to have the pleural catheter taken out.  We will see about have this taken out.  He has had no problems with his HIV.  He continues on Logan.  There is still some pain.  He is using oxycodone alone.  He is not using the fentanyl patches.  He has had no fever.  He has had no bleeding.  He has had no change in bowel or bladder habits.  He has had no mouth sores.  He has had no headache.  Overall, I would say that his performance status is probably ECOG 1.   Medications:  Current Outpatient Medications:    acetaminophen (TYLENOL) 325 MG tablet, Take 2 tablets (650 mg total) by mouth every 6 (six) hours as needed., Disp: , Rfl:    bictegravir-emtricitabine-tenofovir AF (BIKTARVY) 50-200-25 MG TABS tablet, Take 1 tablet by mouth daily., Disp: 30 tablet, Rfl: 0   dexamethasone (DECADRON) 4 MG tablet, Take 2 tablets (8mg ) by mouth daily starting the day after carboplatin for 3 days. Take with food, Disp: 30 tablet, Rfl: 1   dronabinol (MARINOL) 5 MG capsule, Take 1 capsule (5 mg total) by mouth 2 (two) times daily before a meal., Disp: 60 capsule, Rfl: 0    HYDROmorphone (DILAUDID) 4 MG tablet, Take 1 tablet (4 mg total) by mouth every 6 (six) hours as needed for severe pain (pain score 7-10)., Disp: 90 tablet, Rfl: 0   ondansetron (ZOFRAN) 8 MG tablet, Take 1 tablet (8 mg total) by mouth every 8 (eight) hours as needed for nausea or vomiting. Start on the third day after carboplatin., Disp: 30 tablet, Rfl: 1   prochlorperazine (COMPAZINE) 10 MG tablet, Take 1 tablet (10 mg total) by mouth every 6 (six) hours as needed for nausea or vomiting., Disp: 30 tablet, Rfl: 1   lidocaine-prilocaine (EMLA) cream, Apply to affected area once, Disp: 30 g, Rfl: 3   oxyCODONE (OXY IR/ROXICODONE) 5 MG immediate release tablet, Take 1 tablet (5 mg total) by mouth every 6 (six) hours as needed., Disp: 60 tablet, Rfl: 0   senna-docusate (SENOKOT-S) 8.6-50 MG tablet, Take 1 tablet by mouth 2 (two) times daily between meals as needed for mild constipation. (Patient not taking: Reported on 01/09/2023), Disp: , Rfl:  No current facility-administered medications for this visit.  Facility-Administered Medications Ordered in Other Visits:    0.9 %  sodium chloride infusion, , Intravenous, Continuous, Teshawn Moan, Rose Phi, MD, Stopped at 03/21/23 1531   heparin lock flush 100 unit/mL, 500 Units, Intracatheter, Once PRN, Serene Kopf, Rose Phi, MD   sodium chloride flush (NS) 0.9 % injection 10 mL, 10 mL, Intracatheter, PRN, Aradhana Gin,  Rose Phi, MD  Allergies: No Known Allergies  Past Medical History, Surgical history, Social history, and Family History were reviewed and updated.  Review of Systems: Review of Systems  Constitutional: Negative.   HENT:  Negative.    Eyes: Negative.   Respiratory:  Positive for cough and shortness of breath.   Cardiovascular:  Positive for chest pain.  Gastrointestinal: Negative.   Endocrine: Negative.   Genitourinary: Negative.    Musculoskeletal: Negative.   Skin: Negative.   Neurological: Negative.   Hematological: Negative.    Psychiatric/Behavioral: Negative.      Physical Exam:  height is 6' (1.829 m) and weight is 175 lb 8 oz (79.6 kg). His oral temperature is 98.6 F (37 C). His blood pressure is 141/90 (abnormal) and his pulse is 116 (abnormal). His respiration is 18 and oxygen saturation is 98%.   Wt Readings from Last 3 Encounters:  03/21/23 175 lb 8 oz (79.6 kg)  02/13/23 169 lb (76.7 kg)  01/29/23 167 lb (75.8 kg)    Physical Exam Vitals reviewed.  HENT:     Head: Normocephalic and atraumatic.  Eyes:     Pupils: Pupils are equal, round, and reactive to light.  Cardiovascular:     Rate and Rhythm: Normal rate and regular rhythm.     Heart sounds: Normal heart sounds.     Comments: Cardiac exam is tachycardic but regular.  There are no murmurs. Pulmonary:     Effort: Pulmonary effort is normal.     Breath sounds: Normal breath sounds.     Comments: His lungs sound relatively clear bilaterally.  Maybe a little bit decreased and breath sounds are on the left lower lung. Abdominal:     General: Bowel sounds are normal.     Palpations: Abdomen is soft.     Comments: Abdominal exam is soft.  He has good bowel sounds.  There is no fluid wave.  There is no palpable liver or spleen tip.  Musculoskeletal:        General: No tenderness or deformity. Normal range of motion.     Cervical back: Normal range of motion.  Lymphadenopathy:     Cervical: No cervical adenopathy.  Skin:    General: Skin is warm and dry.     Findings: No erythema or rash.  Neurological:     Mental Status: He is alert and oriented to person, place, and time.  Psychiatric:        Behavior: Behavior normal.        Thought Content: Thought content normal.        Judgment: Judgment normal.      Lab Results  Component Value Date   WBC 13.2 (H) 03/21/2023   HGB 13.7 03/21/2023   HCT 38.5 (L) 03/21/2023   MCV 90.4 03/21/2023   PLT 323 03/21/2023     Chemistry      Component Value Date/Time   NA 143 03/21/2023 0831    NA 143 02/25/2019 1041   K 3.6 03/21/2023 0831   CL 106 03/21/2023 0831   CO2 28 03/21/2023 0831   BUN 12 03/21/2023 0831   BUN 8 02/25/2019 1041   CREATININE 0.99 03/21/2023 0831   CREATININE 0.89 12/14/2022 1132      Component Value Date/Time   CALCIUM 9.8 03/21/2023 0831   ALKPHOS 99 03/21/2023 0831   AST 22 03/21/2023 0831   ALT 10 03/21/2023 0831   BILITOT 0.3 03/21/2023 0831       Impression and  Plan: Mr. Ketelsen is a very nice 42 year old African-American male.  He has what certainly is acting like a large cell neuroendocrine carcinoma.  Upon the CT scan, he does seem to be responding which is certainly encouraging.  We will go ahead with his third cycle of treatment.  It is hard to say how many cycles of treatment we will give him.  We may give him 4 cycles  of chemotherapy and then consider using chemoradiation therapy.   I really need to see what his molecular studies show.  I am sure that they had been done.  I just cannot find them.  We will go ahead and plan to get him back after the holidays for his fourth cycle of treatment.  I am glad that he is responding.  Will that we can try to get the Pleurx catheter taken out.     Josph Macho, MD 12/11/20245:11 PM

## 2023-03-21 NOTE — Patient Instructions (Signed)
[facility] - A DEPT OF [facility]  Discharge Instructions: Thank you for choosing [facility] to provide your oncology and hematology care.   If you have a lab appointment with the Cancer Center, please go directly to the Cancer Center and check in at the registration area.  Wear comfortable clothing and clothing appropriate for easy access to any Portacath or PICC line.   We strive to give you quality time with your provider. You may need to reschedule your appointment if you arrive late (15 or more minutes).  Arriving late affects you and other patients whose appointments are after yours.  Also, if you miss three or more appointments without notifying the office, you may be dismissed from the clinic at the provider's discretion.      For prescription refill requests, have your pharmacy contact our office and allow 72 hours for refills to be completed.    Today you received the following chemotherapy and/or immunotherapy agents taxol, carboplatin, Avastin,cecentriq      To help prevent nausea and vomiting after your treatment, we encourage you to take your nausea medication as directed.  BELOW ARE SYMPTOMS THAT SHOULD BE REPORTED IMMEDIATELY: *FEVER GREATER THAN 100.4 F (38 C) OR HIGHER *CHILLS OR SWEATING *NAUSEA AND VOMITING THAT IS NOT CONTROLLED WITH YOUR NAUSEA MEDICATION *UNUSUAL SHORTNESS OF BREATH *UNUSUAL BRUISING OR BLEEDING *URINARY PROBLEMS (pain or burning when urinating, or frequent urination) *BOWEL PROBLEMS (unusual diarrhea, constipation, pain near the anus) TENDERNESS IN MOUTH AND THROAT WITH OR WITHOUT PRESENCE OF ULCERS (sore throat, sores in mouth, or a toothache) UNUSUAL RASH, SWELLING OR PAIN  UNUSUAL VAGINAL DISCHARGE OR ITCHING   Items with * indicate a potential emergency and should be followed up as soon as possible or go to the Emergency Department if any problems should occur.  Please show the CHEMOTHERAPY  ALERT CARD or IMMUNOTHERAPY ALERT CARD at check-in to the Emergency Department and triage nurse. Should you have questions after your visit or need to cancel or reschedule your appointment, please contact [facility] - A DEPT OF [facility]  [phone] and follow the prompts.  Office hours are 8:00 a.m. to 4:30 p.m. Monday - Friday. Please note that voicemails left after 4:00 p.m. may not be returned until the following business day.  We are closed weekends and major holidays. You have access to a nurse at all times for urgent questions. Please call the main number to the clinic [phone] and follow the prompts.  For any non-urgent questions, you may also contact your provider using MyChart. We now offer e-Visits for anyone 65 and older to request care online for non-urgent symptoms. For details visit [email or web address].   Also download the MyChart app! Go to the app store, search "MyChart", open the app, select [facility], and log in with your MyChart username and password.

## 2023-03-22 ENCOUNTER — Telehealth: Payer: Self-pay

## 2023-03-22 ENCOUNTER — Encounter: Payer: Self-pay | Admitting: Hematology & Oncology

## 2023-03-22 NOTE — Telephone Encounter (Signed)
ATC x2.  Left detailed message for [name].  Advised that this patient has never been seen in our office and has never had a Bronch by Dr. Wynona Neat according to to chart.  Advised to call back with any questions.

## 2023-03-22 NOTE — Telephone Encounter (Signed)
Notified patient of prior authorization approval for Oxycodone HCL 5 mg Tablets. Medication is approved through 09/19/2023. No other needs or concerns voiced at this time.

## 2023-03-22 NOTE — Progress Notes (Signed)
Patient will proceed with cycle three. He wishes to have his Pleurx removed as he's not longer draining fluid. Order placed.   There was indication that patient had molecular testing through his pulmonologist. Called and requested report but I have not received follow up at this time. Will continue to reach out.   Oncology Nurse Navigator Documentation     03/21/2023    8:00 AM  Oncology Nurse Navigator Flowsheets  Navigator Follow Up Date: 04/12/2023  Navigator Follow Up Reason: Follow-up Appointment;Chemotherapy  Navigator Location [facility]  Navigator Encounter Type Appt/Treatment Plan Review;Treatment  Patient Visit Type MedOnc  Treatment Phase Active Tx  Barriers/Navigation Needs Coordination of Care;Education  Interventions Coordination of Care  Acuity Level 2-Minimal Needs (1-2 Barriers Identified)  Coordination of Care Other  Time Spent with Patient 30

## 2023-03-23 ENCOUNTER — Emergency Department (HOSPITAL_COMMUNITY)
Admission: EM | Admit: 2023-03-23 | Discharge: 2023-03-23 | Disposition: A | Discharge status: Home / Self Care | Payer: 59 | Attending: Emergency Medicine | Admitting: Emergency Medicine

## 2023-03-23 ENCOUNTER — Other Ambulatory Visit: Payer: Self-pay

## 2023-03-23 ENCOUNTER — Emergency Department (HOSPITAL_COMMUNITY): Payer: 59

## 2023-03-23 ENCOUNTER — Inpatient Hospital Stay: Payer: 59

## 2023-03-23 VITALS — BP 148/84 | HR 96 | Temp 97.9°F | Resp 16

## 2023-03-23 DIAGNOSIS — R079 Chest pain, unspecified: Secondary | ICD-10-CM | POA: Insufficient documentation

## 2023-03-23 DIAGNOSIS — R059 Cough, unspecified: Secondary | ICD-10-CM | POA: Insufficient documentation

## 2023-03-23 DIAGNOSIS — Z1152 Encounter for screening for COVID-19: Secondary | ICD-10-CM | POA: Diagnosis not present

## 2023-03-23 DIAGNOSIS — Z85118 Personal history of other malignant neoplasm of bronchus and lung: Secondary | ICD-10-CM | POA: Insufficient documentation

## 2023-03-23 DIAGNOSIS — C7A1 Malignant poorly differentiated neuroendocrine tumors: Secondary | ICD-10-CM

## 2023-03-23 LAB — TROPONIN I (HIGH SENSITIVITY)
Troponin I (High Sensitivity): 7 ng/L (ref <18)
Troponin I (High Sensitivity): 10 ng/L (ref <18)

## 2023-03-23 LAB — CBC
HCT: 41.1 % (ref 39.0–52.0)
Hemoglobin: 14.6 g/dL (ref 13.0–17.0)
MCH: 32.1 pg (ref 26.0–34.0)
MCHC: 35.5 g/dL (ref 30.0–36.0)
MCV: 90.3 fL (ref 80.0–100.0)
Platelets: 264 10*3/uL (ref 150–400)
RBC: 4.55 MIL/uL (ref 4.22–5.81)
RDW: 17.1 % — ABNORMAL HIGH (ref 11.5–15.5)
WBC: 17.2 10*3/uL — ABNORMAL HIGH (ref 4.0–10.5)
nRBC: 0 % (ref 0.0–0.2)

## 2023-03-23 LAB — BASIC METABOLIC PANEL
Anion gap: 12 (ref 5–15)
BUN: 12 mg/dL (ref 6–20)
CO2: 28 mmol/L (ref 22–32)
Calcium: 8.9 mg/dL (ref 8.9–10.3)
Chloride: 99 mmol/L (ref 98–111)
Creatinine, Ser: 1.07 mg/dL (ref 0.61–1.24)
GFR, Estimated: >60 mL/min (ref >60)
Glucose, Bld: 102 mg/dL — ABNORMAL HIGH (ref 70–99)
Potassium: 3.4 mmol/L — ABNORMAL LOW (ref 3.5–5.1)
Sodium: 139 mmol/L (ref 135–145)

## 2023-03-23 LAB — RESP PANEL BY RT-PCR (RSV, FLU A&B, COVID)  RVPGX2
Influenza A by PCR: NEGATIVE
Influenza B by PCR: NEGATIVE
Resp Syncytial Virus by PCR: NEGATIVE
SARS Coronavirus 2 by RT PCR: NEGATIVE

## 2023-03-23 LAB — HEPATIC FUNCTION PANEL
ALT: 17 U/L (ref 0–44)
AST: 34 U/L (ref 15–41)
Albumin: 3.5 g/dL (ref 3.5–5.0)
Alkaline Phosphatase: 75 U/L (ref 38–126)
Bilirubin, Direct: 0.2 mg/dL (ref 0.0–0.2)
Indirect Bilirubin: 1 mg/dL — ABNORMAL HIGH (ref 0.3–0.9)
Total Bilirubin: 1.2 mg/dL — ABNORMAL HIGH (ref <1.2)
Total Protein: 6.6 g/dL (ref 6.5–8.1)

## 2023-03-23 MED ORDER — MORPHINE SULFATE (PF) 4 MG/ML IV SOLN
4.0000 mg | Freq: Once | INTRAVENOUS | Status: AC
  Administered 2023-03-23: 4 mg via INTRAVENOUS
  Filled 2023-03-23: qty 1

## 2023-03-23 MED ORDER — OXYCODONE HCL 5 MG PO TABS
5.0000 mg | ORAL_TABLET | ORAL | Status: AC
  Administered 2023-03-23: 5 mg via ORAL
  Filled 2023-03-23: qty 1

## 2023-03-23 MED ORDER — IOHEXOL 350 MG/ML SOLN
75.0000 mL | Freq: Once | INTRAVENOUS | Status: AC | PRN
  Administered 2023-03-23: 75 mL via INTRAVENOUS

## 2023-03-23 MED ORDER — PEGFILGRASTIM-JMDB 6 MG/0.6ML ~~LOC~~ SOSY
6.0000 mg | PREFILLED_SYRINGE | Freq: Once | SUBCUTANEOUS | Status: AC
  Administered 2023-03-23: 6 mg via SUBCUTANEOUS

## 2023-03-23 NOTE — ED Provider Triage Note (Signed)
Emergency Medicine Provider Triage Evaluation Note  Christopher Henson , a 42 y.o. male hx of NSCLC on chemo was evaluated in triage.  Pt complains of chest pain, SOB, cough starting at 0300 today.  Review of Systems  Positive: See above, diaphoresis Negative: fevers  Physical Exam  BP (!) 141/96   Pulse (!) 113   Temp 98.5 F (36.9 C)   Resp 18   SpO2 100%  Gen:   Awake, no distress   Resp:  Normal effort  MSK:   Moves extremities without difficulty  Other:    Medical Decision Making  Medically screening exam initiated at 4:53 PM.  Appropriate orders placed.  Christopher Henson was informed that the remainder of the evaluation will be completed by another provider, this initial triage assessment does not replace that evaluation, and the importance of remaining in the ED until their evaluation is complete.  Recommend observing rather than sending to hallway   Rondel Baton, MD 03/23/23 1654

## 2023-03-23 NOTE — Discharge Instructions (Addendum)
Return for any problem.  ?

## 2023-03-23 NOTE — ED Triage Notes (Signed)
Pt bib ems from home; wears 3L o2 at baseline; 0300 this am, began having cp radiating to L shoulder, nausea; cancer pt, received chemo treatment  last Wednesday; received chemo booster shot today; 324 asa given pta, 1 nitro, no change in cp; pressure 8/10; has a port; 140/98., p 110, rr 16, 97% 3L, 12 lead unremarkable

## 2023-03-23 NOTE — Patient Instructions (Signed)

## 2023-03-23 NOTE — ED Provider Notes (Signed)
[place] [facility] DE[facility]MENT AT [facility] Provider Note   CSN: [id number] Arrival date & time: 03/23/23  1624     History {Add pertinent medical, surgical, social history, OB history to HPI:1} No chief complaint on file.   Christopher Henson is a 42 y.o. male.  42 year old male with prior medical history as detailed below presents for evaluation.  Patient complains of left-sided chest pain, mild cough.  Symptoms began early this morning around 3 AM.  Patient with history of NSCLC on chemo.  He denies fever.  Cough is nonproductive.  He was given a "shot today to increase my blood counts."  The history is provided by the patient and medical records.       Home Medications Prior to Admission medications   Medication Sig Start Date End Date Taking? Authorizing Provider  acetaminophen (TYLENOL) 325 MG tablet Take 2 tablets (650 mg total) by mouth every 6 (six) hours as needed. 01/17/23  Yes Dorcas Carrow, MD  bictegravir-emtricitabine-tenofovir AF (BIKTARVY) 50-200-25 MG TABS tablet Take 1 tablet by mouth daily. 02/23/23  Yes Daiva Eves, Lisette Grinder, MD  dronabinol (MARINOL) 5 MG capsule Take 1 capsule (5 mg total) by mouth 2 (two) times daily before a meal. 01/10/23  Yes Ennever, Rose Phi, MD  HYDROmorphone (DILAUDID) 4 MG tablet Take 1 tablet (4 mg total) by mouth every 6 (six) hours as needed for severe pain (pain score 7-10). 02/05/23  Yes Josph Macho, MD  lidocaine-prilocaine (EMLA) cream Apply to affected area once 03/21/23  Yes Ennever, Rose Phi, MD  ondansetron (ZOFRAN) 8 MG tablet Take 1 tablet (8 mg total) by mouth every 8 (eight) hours as needed for nausea or vomiting. Start on the third day after carboplatin. 01/23/23  Yes Ennever, Rose Phi, MD  oxyCODONE (OXY IR/ROXICODONE) 5 MG immediate release tablet Take 1 tablet (5 mg total) by mouth every 6 (six) hours as needed. 03/21/23  Yes Josph Macho, MD  prochlorperazine (COMPAZINE) 10 MG tablet Take 1 tablet  (10 mg total) by mouth every 6 (six) hours as needed for nausea or vomiting. 01/23/23  Yes Ennever, Rose Phi, MD  senna-docusate (SENOKOT-S) 8.6-50 MG tablet Take 1 tablet by mouth 2 (two) times daily between meals as needed for mild constipation. 01/03/23  Yes Rhetta Mura, MD  dexamethasone (DECADRON) 4 MG tablet Take 2 tablets (8mg ) by mouth daily starting the day after carboplatin for 3 days. Take with food Patient not taking: Reported on 03/23/2023 01/23/23   Josph Macho, MD      Allergies    Patient has no known allergies.    Review of Systems   Review of Systems  All other systems reviewed and are negative.   Physical Exam Updated Vital Signs BP (!) 141/96   Pulse (!) 113   Temp 98.5 F (36.9 C)   Resp 18   SpO2 100%  Physical Exam Vitals and nursing note reviewed.  Constitutional:      General: He is not in acute distress.    Appearance: Normal appearance. He is well-developed.  HENT:     Head: Normocephalic and atraumatic.  Eyes:     Conjunctiva/sclera: Conjunctivae normal.     Pupils: Pupils are equal, round, and reactive to light.  Cardiovascular:     Rate and Rhythm: Normal rate and regular rhythm.     Heart sounds: Normal heart sounds.  Pulmonary:     Effort: Pulmonary effort is normal. No respiratory distress.  Breath sounds: Normal breath sounds.  Abdominal:     General: There is no distension.     Palpations: Abdomen is soft.     Tenderness: There is no abdominal tenderness.  Musculoskeletal:        General: No deformity. Normal range of motion.     Cervical back: Normal range of motion and neck supple.  Skin:    General: Skin is warm and dry.  Neurological:     General: No focal deficit present.     Mental Status: He is alert and oriented to person, place, and time.     ED Results / Procedures / Treatments   Labs (all labs ordered are listed, but only abnormal results are displayed) Labs Reviewed  CBC - Abnormal; Notable for the  following components:      Result Value   WBC 17.2 (*)    RDW 17.1 (*)    All other components within normal limits  RESP PANEL BY RT-PCR (RSV, FLU A&B, COVID)  [id number]  BASIC METABOLIC PANEL  HEPATIC FUNCTION PANEL  TROPONIN I (HIGH SENSITIVITY)    EKG EKG Interpretation Date/Time:  Friday March 23 2023 16:21:05 EST Ventricular Rate:  111 PR Interval:  116 QRS Duration:  86 QT Interval:  326 QTC Calculation: 443 R Axis:   61  Text Interpretation: Sinus tachycardia Nonspecific ST and T wave abnormality Abnormal ECG When compared with ECG of 23-Mar-2023 16:20, PREVIOUS ECG IS PRESENT Confirmed by Kristine Royal [phone]) on 03/23/2023 5:04:21 PM  Radiology No results found.  Procedures Procedures  {Document cardiac monitor, telemetry assessment procedure when appropriate:1}  Medications Ordered in ED Medications  oxyCODONE (Oxy IR/ROXICODONE) immediate release tablet 5 mg (has no administration in time range)    ED Course/ Medical Decision Making/ A&P   {   Click here for ABCD2, HEART and other calculatorsREFRESH Note before signing :1}                              Medical Decision Making Amount and/or Complexity of Data Reviewed Labs: ordered. Radiology: ordered.  Risk Prescription drug management.    Medical Screen Complete  This patient presented to the ED with complaint of ***.  This complaint involves an extensive number of treatment options. The initial differential diagnosis includes, but is not limited to, ***  This presentation is: {IllnessRisk:19196::"***","Acute","Chronic","Self-Limited","Previously Undiagnosed","Uncertain Prognosis","Complicated","Systemic Symptoms","Threat to Life/Bodily Function"}    Co morbidities that complicated the patient's evaluation  ***   Additional history obtained:  Additional history obtained from {History source:19196::"EMS","Spouse","Family","Friend","Caregiver"} External records from outside sources  obtained and reviewed including prior ED visits and prior Inpatient records.    Lab Tests:  I ordered and personally interpreted labs.  The pertinent results include:  ***   Imaging Studies ordered:  I ordered imaging studies including ***  I independently visualized and interpreted obtained imaging which showed *** I agree with the radiologist interpretation.   Cardiac Monitoring:  The patient was maintained on a cardiac monitor.  I personally viewed and interpreted the cardiac monitor which showed an underlying rhythm of: ***   Medicines ordered:  I ordered medication including ***  for ***  Reevaluation of the patient after these medicines showed that the patient: {resolved/improved/worsened:23923::"improved"}    Test Considered:  ***   Critical Interventions:  ***   Consultations Obtained:  I consulted ***,  and discussed lab and imaging findings as well as pertinent plan of care.  Problem List / ED Course:  ***   Reevaluation:  After the interventions noted above, I reevaluated the patient and found that they have: {resolved/improved/worsened:23923::"improved"}   Social Determinants of Health:  ***   Disposition:  After consideration of the diagnostic results and the patients response to treatment, I feel that the patent would benefit from ***.    {Document critical care time when appropriate:1} {Document review of labs and clinical decision tools ie heart score, Chads2Vasc2 etc:1}  {Document your independent review of radiology images, and any outside records:1} {Document your discussion with family members, caretakers, and with consultants:1} {Document social determinants of health affecting pt's care:1} {Document your decision making why or why not admission, treatments were needed:1} Final Clinical Impression(s) / ED Diagnoses Final diagnoses:  None    Rx / DC Orders ED Discharge Orders     None

## 2023-03-24 ENCOUNTER — Other Ambulatory Visit: Payer: Self-pay

## 2023-03-26 ENCOUNTER — Inpatient Hospital Stay: Payer: 59

## 2023-03-26 ENCOUNTER — Telehealth: Payer: Self-pay | Admitting: *Deleted

## 2023-03-26 ENCOUNTER — Inpatient Hospital Stay: Payer: 59 | Admitting: Hematology & Oncology

## 2023-03-26 NOTE — Telephone Encounter (Signed)
I spoke with Dr. Wynona Neat and he states that the testing is not something that he orders.  He did not order this testing after the bronch, so if he needs it done, they should go ahead and order it.

## 2023-03-26 NOTE — Telephone Encounter (Signed)
Received phone call from Christean Leaf ([facility] with [facility]) regarding moleculare testing, she is wondering if the [employer] (molecular  testing, blood and tissue).  She said it does not show up in Epic, it has to be scanned into the chart.  It is a very expensive test and does not want to order it again if you already ordered it.  You did a bronch on this patient on 12/21/22.  She said he never followed up with our office.  Please advise.  Thank you.

## 2023-03-27 ENCOUNTER — Other Ambulatory Visit: Payer: Self-pay

## 2023-03-27 ENCOUNTER — Other Ambulatory Visit: Payer: Self-pay | Admitting: Hematology & Oncology

## 2023-03-27 ENCOUNTER — Encounter (HOSPITAL_COMMUNITY): Payer: Self-pay

## 2023-03-27 ENCOUNTER — Encounter: Payer: Self-pay | Admitting: Hematology & Oncology

## 2023-03-27 ENCOUNTER — Inpatient Hospital Stay (HOSPITAL_COMMUNITY)
Admission: RE | Admit: 2023-03-27 | Discharge: 2023-03-27 | Discharge status: Home / Self Care | Payer: 59 | Source: Ambulatory Visit | Attending: Hematology & Oncology

## 2023-03-27 ENCOUNTER — Encounter: Payer: Self-pay | Admitting: *Deleted

## 2023-03-27 ENCOUNTER — Ambulatory Visit (HOSPITAL_COMMUNITY)
Admission: RE | Admit: 2023-03-27 | Discharge: 2023-03-27 | Disposition: A | Discharge status: Home / Self Care | Payer: 59 | Source: Ambulatory Visit | Attending: Hematology & Oncology | Admitting: Hematology & Oncology

## 2023-03-27 DIAGNOSIS — Z4682 Encounter for fitting and adjustment of non-vascular catheter: Secondary | ICD-10-CM | POA: Diagnosis present

## 2023-03-27 DIAGNOSIS — C7A1 Malignant poorly differentiated neuroendocrine tumors: Secondary | ICD-10-CM | POA: Insufficient documentation

## 2023-03-27 HISTORY — PX: IR REMOVAL OF PLURAL CATH W/CUFF: IMG5346

## 2023-03-27 LAB — BASIC METABOLIC PANEL
Anion gap: 11 (ref 5–15)
BUN: 15 mg/dL (ref 6–20)
CO2: 30 mmol/L (ref 22–32)
Calcium: 9.5 mg/dL (ref 8.9–10.3)
Chloride: 95 mmol/L — ABNORMAL LOW (ref 98–111)
Creatinine, Ser: 0.9 mg/dL (ref 0.61–1.24)
GFR, Estimated: >60 mL/min (ref >60)
Glucose, Bld: 100 mg/dL — ABNORMAL HIGH (ref 70–99)
Potassium: 3.9 mmol/L (ref 3.5–5.1)
Sodium: 136 mmol/L (ref 135–145)

## 2023-03-27 LAB — CBC WITH DIFFERENTIAL/PLATELET
Abs Immature Granulocytes: 0.24 10*3/uL — ABNORMAL HIGH (ref 0.00–0.07)
Basophils Absolute: 0.2 10*3/uL — ABNORMAL HIGH (ref 0.0–0.1)
Basophils Relative: 1 %
Eosinophils Absolute: 0.5 10*3/uL (ref 0.0–0.5)
Eosinophils Relative: 3 %
HCT: 39.9 % (ref 39.0–52.0)
Hemoglobin: 13.9 g/dL (ref 13.0–17.0)
Immature Granulocytes: 2 %
Lymphocytes Relative: 9 %
Lymphs Abs: 1.3 10*3/uL (ref 0.7–4.0)
MCH: 32.3 pg (ref 26.0–34.0)
MCHC: 34.8 g/dL (ref 30.0–36.0)
MCV: 92.6 fL (ref 80.0–100.0)
Monocytes Absolute: 1.6 10*3/uL — ABNORMAL HIGH (ref 0.1–1.0)
Monocytes Relative: 11 %
Neutro Abs: 11.4 10*3/uL — ABNORMAL HIGH (ref 1.7–7.7)
Neutrophils Relative %: 74 %
Platelets: 240 10*3/uL (ref 150–400)
RBC: 4.31 MIL/uL (ref 4.22–5.81)
RDW: 17.3 % — ABNORMAL HIGH (ref 11.5–15.5)
WBC: 15.3 10*3/uL — ABNORMAL HIGH (ref 4.0–10.5)
nRBC: 0 % (ref 0.0–0.2)

## 2023-03-27 MED ORDER — MIDAZOLAM HCL 2 MG/2ML IJ SOLN
INTRAMUSCULAR | Status: AC | PRN
  Administered 2023-03-27: 1 mg via INTRAVENOUS

## 2023-03-27 MED ORDER — FENTANYL CITRATE (PF) 100 MCG/2ML IJ SOLN
INTRAMUSCULAR | Status: AC | PRN
  Administered 2023-03-27: 50 ug via INTRAVENOUS

## 2023-03-27 MED ORDER — LIDOCAINE-EPINEPHRINE 1 %-1:100000 IJ SOLN
20.0000 mL | Freq: Once | INTRAMUSCULAR | Status: AC
Start: 2023-03-27 — End: 2023-03-27
  Administered 2023-03-27: 10 mL via INTRADERMAL

## 2023-03-27 MED ORDER — MIDAZOLAM HCL 2 MG/2ML IJ SOLN
INTRAMUSCULAR | Status: AC
  Filled 2023-03-27: qty 2

## 2023-03-27 MED ORDER — SODIUM CHLORIDE 0.9 % IV SOLN
INTRAVENOUS | Status: DC
Start: 2023-03-27 — End: 2023-03-27

## 2023-03-27 MED ORDER — LIDOCAINE-EPINEPHRINE 1 %-1:100000 IJ SOLN
INTRAMUSCULAR | Status: AC
  Filled 2023-03-27: qty 1

## 2023-03-27 MED ORDER — FENTANYL CITRATE (PF) 100 MCG/2ML IJ SOLN
INTRAMUSCULAR | Status: AC
  Filled 2023-03-27: qty 2

## 2023-03-27 NOTE — H&P (Signed)
Referring Physician(s): Ennever,Peter R  Supervising Physician: Gilmer Mor  Patient Status:  [facility]  Chief Complaint:  "I'm getting my chest catheter out"  Subjective: Pt known to IR team from BM bx and port a cath placement  on 12/27/22, left thoracenteses on 12/29/22 and 01/02/23 and left pleurx cath placement on 01/15/23. 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 Latest CT chest on 03/23/23 revealed: 1. No evidence of PE. 2. Left upper lobe paramediastinal mass with hilar adenopathy and pleural-based nodularity. Small left effusion. Left chest tube.  He has had minimal output from his left pleurx and presents today for left pleurx catheter removal . He denies fever,HA, abd pain, back pain,N/V or bleeding. He does have left chest discomfort, dyspnea with exertion and occ cough.     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   GERD (gastroesophageal reflux disease)    History of cryptococcal meningitis    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   Protein calorie malnutrition (HCC)    Small cell lung cancer, 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   Thrombocytopenia (HCC) 06/2018   Past Surgical History:  Procedure Laterality Date   BIOPSY  12/21/2022   Procedure: BIOPSY;  Surgeon: Tomma Lightning, MD;  Location: [facility] ENDOS[facility];  Service: Endoscopy;;   BRONCHIAL WASHINGS  12/21/2022   Procedure: BRONCHIAL WASHINGS;  Surgeon: Tomma Lightning, MD;  Location: [facility];  Service: Endoscopy;;   ENDOBRONCHIAL ULTRASOUND Left 12/21/2022   Procedure: ENDOBRONCHIAL ULTRASOUND;  Surgeon: Tomma Lightning, MD;  Location: [facility];  Service: Endoscopy;  Laterality: Left;  Endobronchial ultrasound, biopsy, need fluoroscopy   FINE NEEDLE ASPIRATION  12/21/2022   Procedure: FINE NEEDLE  ASPIRATION (FNA) LINEAR;  Surgeon: Tomma Lightning, MD;  Location: [facility];  Service: Endoscopy;;   IR BONE MARROW BIOPSY & ASPIRATION  12/27/2022   IR IMAGING GUIDED PORT INSERTION  12/27/2022   IR LUMBAR PUNCTURE  12/22/2022   IR PERC PLEURAL DRAIN W/INDWELL CATH W/IMG GUIDE  01/15/2023   LUMBAR PUNCTURE  06/2018   during eval for meningitis    VIDEO BRONCHOSCOPY Left 12/21/2022   Procedure: VIDEO BRONCHOSCOPY WITH FLUORO, endobronchial ultrasound;  Surgeon: Tomma Lightning, MD;  Location: [facility];  Service: Endoscopy;  Laterality: Left;  Schedule for 12/20/2022 12/21/2022      Allergies: Patient has no known allergies.  Medications: Prior to Admission medications   Medication Sig Start Date End Date Taking? Authorizing Provider  acetaminophen (TYLENOL) 325 MG tablet Take 2 tablets (650 mg total) by mouth every 6 (six) hours as needed. 01/17/23   Dorcas Carrow, MD  bictegravir-emtricitabine-tenofovir AF (BIKTARVY) 50-200-25 MG TABS tablet Take 1 tablet by mouth daily. 02/23/23   Randall Hiss, MD  dexamethasone (DECADRON) 4 MG tablet Take 2 tablets (8mg ) by mouth daily starting the day after carboplatin for 3 days. Take with food Patient not taking: Reported on 03/23/2023 01/23/23   Josph Macho, MD  dronabinol (MARINOL) 5 MG capsule Take 1 capsule (5 mg total) by mouth 2 (two) times daily before a meal. 01/10/23   Ennever, Rose Phi, MD  HYDROmorphone (DILAUDID) 4 MG tablet Take 1 tablet (4 mg total) by mouth every 6 (six) hours as needed for severe pain (pain score 7-10). 02/05/23   Arlan Organ  R, MD  lidocaine-prilocaine (EMLA) cream Apply to affected area once 03/21/23   Josph Macho, MD  ondansetron (ZOFRAN) 8 MG tablet Take 1 tablet (8 mg total) by mouth every 8 (eight) hours as needed for nausea or vomiting. Start on the third day after carboplatin. 01/23/23   Josph Macho, MD  oxyCODONE (OXY IR/ROXICODONE) 5 MG immediate release tablet Take 1 tablet (5  mg total) by mouth every 6 (six) hours as needed. 03/21/23   Josph Macho, MD  prochlorperazine (COMPAZINE) 10 MG tablet Take 1 tablet (10 mg total) by mouth every 6 (six) hours as needed for nausea or vomiting. 01/23/23   Josph Macho, MD  senna-docusate (SENOKOT-S) 8.6-50 MG tablet Take 1 tablet by mouth 2 (two) times daily between meals as needed for mild constipation. 01/03/23   Rhetta Mura, MD     Vital Signs: BP (!) 133/95   Pulse (!) 107   Temp 98.3 F (36.8 C) (Oral)   Resp 16   Ht 6' (1.829 m)   Wt 173 lb (78.5 kg)   SpO2 95%   BMI 23.46 kg/m   Code Status: FULL CODE  Physical Exam; awake/alert; chest- distant BS bilat; intact rt chest wall port a cath;  heart- sl tachy but reg rhythm; abd-soft,+BS,NT, left pleurx cath intact/capped left abd region; no LE edema  Imaging: CT Angio Chest PE W and/or Wo Contrast Result Date: 03/23/2023 CLINICAL DATA:  Chest pain EXAM: CT ANGIOGRAPHY CHEST WITH CONTRAST TECHNIQUE: Multidetector CT imaging of the chest was performed using the standard protocol during bolus administration of intravenous contrast. Multiplanar CT image reconstructions and MIPs were obtained to evaluate the vascular anatomy. RADIATION DOSE REDUCTION: This exam was performed according to the departmental dose-optimization program which includes automated exposure control, adjustment of the mA and/or kV according to patient size and/or use of iterative reconstruction technique. CONTRAST:  75mL OMNIPAQUE IOHEXOL 350 MG/ML SOLN COMPARISON:  03/16/2023. FINDINGS: Cardiovascular: Satisfactory opacification of the pulmonary arteries to the segmental level. No evidence of pulmonary embolism. Normal heart size. No pericardial effusion. Central pulmonary arteries on the left are encased but patent. Mediastinum/Nodes: Right hilar adenopathy with a 1.9 cm node. Matted left-sided hilar nodes contiguous with a left upper lobe paramediastinal mass. Limited measures about  10.7 cm which is unchanged when measured in the same manner as on the prior study. Lungs/Pleura: Left-sided pleural-based nodularity and small pleural effusion. Left apical chest tube. Upper Abdomen: No acute abnormality. Musculoskeletal: No chest wall abnormality. No acute or significant osseous findings. Review of the MIP images confirms the above findings. IMPRESSION: 1. No evidence of PE. 2. Left upper lobe paramediastinal mass with hilar adenopathy and pleural-based nodularity. Small left effusion. Left chest tube. Electronically Signed   By: Layla Maw M.D.   On: 03/23/2023 21:30   DG Chest Port 1 View Result Date: 03/23/2023 CLINICAL DATA:  Chest pain and nausea.  History of lung cancer EXAM: PORTABLE CHEST 1 VIEW COMPARISON:  Radiograph 01/29/2023 and CT chest 03/16/2023 FINDINGS: Right chest wall Port-A-Cath tip at the superior cavoatrial junction. Left chest tube. Stable cardiomediastinal silhouette including the left upper paramediastinal mass. Small left pleural effusion and associated airspace opacities in the left lower lung. The right lung is clear. No pneumothorax. No displaced rib fractures. IMPRESSION: Small left pleural effusion and associated airspace opacities in the left lower lung. Left chest tube in place. Grossly similar left upper mediastinal mass. Electronically Signed   By: Angelique Holm.D.  On: 03/23/2023 19:59    Labs:  CBC: Recent Labs    01/29/23 0936 02/13/23 0820 03/21/23 0831 03/23/23 1651  WBC 23.3* 5.8 13.2* 17.2*  HGB 14.0 13.1 13.7 14.6  HCT 39.5 36.4* 38.5* 41.1  PLT 295 245 323 264    COAGS: Recent Labs    12/18/22 0226 01/15/23 0523  INR 1.0 1.0  APTT 24  --     BMP: Recent Labs    01/29/23 0936 02/13/23 0820 03/21/23 0831 03/23/23 1651  NA 135 139 143 139  K 3.9 3.9 3.6 3.4*  CL 99 104 106 99  CO2 27 28 28 28   GLUCOSE 104* 99 97 102*  BUN 12 9 12 12   CALCIUM 9.3 9.3 9.8 8.9  CREATININE 1.00 1.02 0.99 1.07  GFRNONAA  >60 >60 >60 >60    LIVER FUNCTION TESTS: Recent Labs    01/29/23 0936 02/13/23 0820 03/21/23 0831 03/23/23 1735  BILITOT 0.5 0.4 0.3 1.2*  AST 30 12* 22 34  ALT 32 9 10 17   ALKPHOS 136* 94 99 75  PROT 7.1 7.0 7.5 6.6  ALBUMIN 3.4* 4.1 4.4 3.5    Assessment and Plan: 42 yo male with PMH sig for HIV, alcohol abuse, GERD, 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 Latest CT chest on 03/23/23 revealed: 1. No evidence of PE. 2. Left upper lobe paramediastinal mass with hilar adenopathy and pleural-based nodularity. Small left effusion. Left chest tube.  He has had minimal output from his left pleurx and presents today for left pleurx catheter removal . Details/risks of procedure, incl but not limited to, internal bleeding, infection, injury to adjacent structures d/w pt with his understanding and consent.    Electronically Signed: D. Jeananne Rama, PA-C 03/27/2023, 8:24 AM   I spent a total of 20 minutes at the the patient's bedside AND on the patient's hospital floor or unit, greater than 50% of which was counseling/coordinating care for left pleurx catheter removal

## 2023-03-27 NOTE — Telephone Encounter (Signed)
Gwendel Hanson, RN to [facility] Triage Pool     03/27/23  7:35 AM  Heather,  I really appreciate you getting back to me. We will order testing.  Thank you and have a great day!  [name]

## 2023-03-27 NOTE — Progress Notes (Signed)
After chart search and speaking with Dr Wynona Neat, there is no indication that the patient's path sample from September had ever been sent for molecular testing. Previous [employer] testing request was cancelled as it was believed that the tissue had been sent. As such, request for [employer] testing sent again on specimen [id number] DOS 12/21/2022.  Oncology Nurse Navigator Documentation     03/27/2023    8:00 AM  Oncology Nurse Navigator Flowsheets  Navigator Follow Up Date: 04/12/2023  Navigator Follow Up Reason: Follow-up Appointment  Navigator Location [facility]  Navigator Encounter Type Molecular Studies  Patient Visit Type MedOnc  Treatment Phase Active Tx  Barriers/Navigation Needs Coordination of Care;Education  Interventions Coordination of Care  Acuity Level 2-Minimal Needs (1-2 Barriers Identified)  Coordination of Care Pathology  Time Spent with Patient 45

## 2023-03-27 NOTE — Discharge Instructions (Addendum)
Please call Interventional Radiology clinic [phone] with any questions or concerns.  You may remove your dressing and shower tomorrow.  After the procedure, it is common to have: Soreness Bruising Mild pain It is normal to experience some discomfort or pain when draining the fluid. If pain is severe during drainage, contact your doctor  Follow these instructions at home:  Medication: Do not use Aspirin or ibuprofen products, such as Advil or Motrin, as it may increase bleeding.  You may resume your usual medications as ordered by your doctor If your doctor prescribed antibiotics, take them as directed. Do not stop taking them just because you feel better. You need to take the full course of antibiotics.  Eating and drinking: Drink plenty of liquids to keep your urine pale yellow You can resume your regular diet as directed by your doctor  Care of the procedure site Check your bandage (dressing) daily to make sure it is clean and dry Keep the skin around the catheter clean and dry Check the catheter regularly for any cracks or kinks in the tubing Check your catheter insertion site every day for signs of infection. Check for: Skin breakdown Redness, swelling, or pain Fluid or blood Warmth Pus or a bad smell How to change your dressing Change your dressing right away if it gets wet. Clean and dry the site then put on a new dressing. Wash your hands with soap and warm water. If soap and water are not available, use hand sanitizer. Gently remove the old dressing Wash the skin around the insertion site with mild, fragrance-free soap and warm water. Rinse well, then pat the area dry with a clean cloth Check the skin around the catheter for signs of infection. Check for: Skin breakdown Redness, swelling, or pain Fluid or blood Warmth Pus or a bad smell Do not apply creams, ointments, or alcohol to the area. Let your skin air-dry completely before you apply a new dressing If you  do not have a pad, use a clean dressing. Slide the dressing under the disk that holds the drainage catheter in place General recommendations Each time you drain the catheter, note the color and amount of fluid Do not take baths, swim, or use a hot tub until your health care provider approves. Take showers only. Take deep breaths regularly, followed by a cough. Doing this can help to prevent lung infection. Keep all follow-up visits as told by your doctor Contact a health care provider if: You have any questions about caring for your catheter or drainage bottle You still have pain at the catheter insertion site more than 2 days after your procedure You have any of these around your catheter insertion site or coming from it: Skin breakdown Redness, swelling, or pain Fluid or blood Warmth Pus or a bad smell Get help right away if: You have a fever or chills You have chest pain You have dizziness or shortness of breath You have severe redness, swelling, or pain at your catheter insertion site The catheter comes out The catheter is blocked or clogged  Moderate Conscious Sedation-Care After  This sheet gives you information about how to care for yourself after your procedure. Your health care provider may also give you more specific instructions. If you have problems or questions, contact your health care provider.  After the procedure, it is common to have: Sleepiness for several hours. Impaired judgment for several hours. Difficulty with balance. Vomiting if you eat too soon.  Follow these instructions at home:  Rest. Do not participate in activities where you could fall or become injured. Do not drive or use machinery. Do not drink alcohol. Do not take sleeping pills or medicines that cause drowsiness. Do not make important decisions or sign legal documents. Do not take care of children on your own.  Eating and drinking Follow the diet recommended by your health care  provider. Drink enough fluid to keep your urine pale yellow. If you vomit: Drink water, juice, or soup when you can drink without vomiting. Make sure you have little or no nausea before eating solid foods.  General instructions Take over-the-counter and prescription medicines only as told by your health care provider. Have a responsible adult stay with you for the time you are told. It is important to have someone help care for you until you are awake and alert. Do not smoke. Keep all follow-up visits as told by your health care provider. This is important.  Contact a health care provider if: You are still sleepy or having trouble with balance after 24 hours. You feel light-headed. You keep feeling nauseous or you keep vomiting. You develop a rash. You have a fever. You have redness or swelling around the IV site.  Get help right away if: You have trouble breathing. You have new-onset confusion at home.  This information is not intended to replace advice given to you by your health care provider. Make sure you discuss any questions you have with your healthcare provider.

## 2023-03-27 NOTE — Procedures (Signed)
Interventional Radiology Procedure Note  Procedure:   Removal of left tunneled pleural catheter.   Sedation: Moderate sedation EBL: 0  Complications: None  Recommendations:  - Ok to shower tomorrow - Do not submerge for 7 days - Routine wound care - 1 hr dc home - advance diet   Signed,  Yvone Neu. Loreta Ave, DO, ABVM, RPVI

## 2023-03-28 ENCOUNTER — Inpatient Hospital Stay: Payer: 59

## 2023-04-05 ENCOUNTER — Encounter: Payer: Self-pay | Admitting: Hematology & Oncology

## 2023-04-06 ENCOUNTER — Encounter: Payer: Self-pay | Admitting: Hematology & Oncology

## 2023-04-12 ENCOUNTER — Encounter: Payer: Self-pay | Admitting: Hematology & Oncology

## 2023-04-12 ENCOUNTER — Other Ambulatory Visit (HOSPITAL_BASED_OUTPATIENT_CLINIC_OR_DEPARTMENT_OTHER): Payer: Self-pay

## 2023-04-12 ENCOUNTER — Encounter: Payer: Self-pay | Admitting: *Deleted

## 2023-04-12 ENCOUNTER — Inpatient Hospital Stay (HOSPITAL_BASED_OUTPATIENT_CLINIC_OR_DEPARTMENT_OTHER): Payer: Medicaid Other | Admitting: Hematology & Oncology

## 2023-04-12 ENCOUNTER — Inpatient Hospital Stay: Payer: Medicaid Other

## 2023-04-12 ENCOUNTER — Inpatient Hospital Stay: Payer: Medicaid Other | Attending: Hematology & Oncology

## 2023-04-12 ENCOUNTER — Inpatient Hospital Stay: Payer: 59

## 2023-04-12 VITALS — BP 150/97 | HR 101 | Temp 97.8°F | Resp 20 | Ht 72.0 in | Wt 175.1 lb

## 2023-04-12 VITALS — BP 139/111 | HR 98 | Resp 18

## 2023-04-12 DIAGNOSIS — Z5111 Encounter for antineoplastic chemotherapy: Secondary | ICD-10-CM | POA: Diagnosis present

## 2023-04-12 DIAGNOSIS — C7A1 Malignant poorly differentiated neuroendocrine tumors: Secondary | ICD-10-CM

## 2023-04-12 DIAGNOSIS — Z5189 Encounter for other specified aftercare: Secondary | ICD-10-CM | POA: Diagnosis not present

## 2023-04-12 DIAGNOSIS — Z21 Asymptomatic human immunodeficiency virus [HIV] infection status: Secondary | ICD-10-CM | POA: Diagnosis not present

## 2023-04-12 DIAGNOSIS — G8929 Other chronic pain: Secondary | ICD-10-CM | POA: Insufficient documentation

## 2023-04-12 DIAGNOSIS — Z5112 Encounter for antineoplastic immunotherapy: Secondary | ICD-10-CM | POA: Insufficient documentation

## 2023-04-12 LAB — CMP (CANCER CENTER ONLY)
ALT: 14 U/L (ref 0–44)
AST: 17 U/L (ref 15–41)
Albumin: 3.9 g/dL (ref 3.5–5.0)
Alkaline Phosphatase: 84 U/L (ref 38–126)
Anion gap: 7 (ref 5–15)
BUN: 10 mg/dL (ref 6–20)
CO2: 29 mmol/L (ref 22–32)
Calcium: 9.3 mg/dL (ref 8.9–10.3)
Chloride: 103 mmol/L (ref 98–111)
Creatinine: 0.95 mg/dL (ref 0.61–1.24)
GFR, Estimated: >60 mL/min (ref >60)
Glucose, Bld: 122 mg/dL — ABNORMAL HIGH (ref 70–99)
Potassium: 3.8 mmol/L (ref 3.5–5.1)
Sodium: 139 mmol/L (ref 135–145)
Total Bilirubin: 0.4 mg/dL (ref 0.0–1.2)
Total Protein: 7 g/dL (ref 6.5–8.1)

## 2023-04-12 LAB — CBC WITH DIFFERENTIAL (CANCER CENTER ONLY)
Abs Immature Granulocytes: 0.03 10*3/uL (ref 0.00–0.07)
Basophils Absolute: 0.1 10*3/uL (ref 0.0–0.1)
Basophils Relative: 1 %
Eosinophils Absolute: 0.2 10*3/uL (ref 0.0–0.5)
Eosinophils Relative: 4 %
HCT: 34.8 % — ABNORMAL LOW (ref 39.0–52.0)
Hemoglobin: 12.5 g/dL — ABNORMAL LOW (ref 13.0–17.0)
Immature Granulocytes: 1 %
Lymphocytes Relative: 23 %
Lymphs Abs: 1.4 10*3/uL (ref 0.7–4.0)
MCH: 32.9 pg (ref 26.0–34.0)
MCHC: 35.9 g/dL (ref 30.0–36.0)
MCV: 91.6 fL (ref 80.0–100.0)
Monocytes Absolute: 0.8 10*3/uL (ref 0.1–1.0)
Monocytes Relative: 13 %
Neutro Abs: 3.5 10*3/uL (ref 1.7–7.7)
Neutrophils Relative %: 58 %
Platelet Count: 255 10*3/uL (ref 150–400)
RBC: 3.8 MIL/uL — ABNORMAL LOW (ref 4.22–5.81)
RDW: 17.2 % — ABNORMAL HIGH (ref 11.5–15.5)
WBC Count: 6 10*3/uL (ref 4.0–10.5)
nRBC: 0 % (ref 0.0–0.2)

## 2023-04-12 LAB — LACTATE DEHYDROGENASE: LDH: 1096 U/L — ABNORMAL HIGH (ref 98–192)

## 2023-04-12 MED ORDER — OXYCODONE HCL ER 15 MG PO T12A
15.0000 mg | EXTENDED_RELEASE_TABLET | Freq: Two times a day (BID) | ORAL | 0 refills | Status: DC
  Filled 2023-04-12: qty 60, 30d supply, fill #0

## 2023-04-12 MED ORDER — SODIUM CHLORIDE 0.9 % IV SOLN
INTRAVENOUS | Status: DC
Start: 2023-04-12 — End: 2023-04-12

## 2023-04-12 MED ORDER — PALONOSETRON HCL INJECTION 0.25 MG/5ML
0.2500 mg | Freq: Once | INTRAVENOUS | Status: AC
  Administered 2023-04-12: 0.25 mg via INTRAVENOUS
  Filled 2023-04-12: qty 5

## 2023-04-12 MED ORDER — SODIUM CHLORIDE 0.9 % IV SOLN
15.0000 mg/kg | Freq: Once | INTRAVENOUS | Status: AC
  Administered 2023-04-12: 1100 mg via INTRAVENOUS
  Filled 2023-04-12: qty 32

## 2023-04-12 MED ORDER — SODIUM CHLORIDE 0.9 % IV SOLN
180.0000 mg/m2 | Freq: Once | INTRAVENOUS | Status: AC
  Administered 2023-04-12: 354 mg via INTRAVENOUS
  Filled 2023-04-12: qty 59

## 2023-04-12 MED ORDER — OXYCODONE HCL 5 MG PO TABS
5.0000 mg | ORAL_TABLET | Freq: Four times a day (QID) | ORAL | 0 refills | Status: DC | PRN
  Filled 2023-04-12: qty 60, 15d supply, fill #0

## 2023-04-12 MED ORDER — SODIUM CHLORIDE 0.9 % IV SOLN
791.4000 mg | Freq: Once | INTRAVENOUS | Status: AC
  Administered 2023-04-12: 790 mg via INTRAVENOUS
  Filled 2023-04-12: qty 79

## 2023-04-12 MED ORDER — HEPARIN SOD (PORK) LOCK FLUSH 100 UNIT/ML IV SOLN
500.0000 [IU] | Freq: Once | INTRAVENOUS | Status: AC | PRN
  Administered 2023-04-12: 500 [IU]

## 2023-04-12 MED ORDER — DIPHENHYDRAMINE HCL 50 MG/ML IJ SOLN
50.0000 mg | Freq: Once | INTRAMUSCULAR | Status: AC
  Administered 2023-04-12: 50 mg via INTRAVENOUS
  Filled 2023-04-12: qty 1

## 2023-04-12 MED ORDER — CLONIDINE HCL 0.1 MG PO TABS
0.2000 mg | ORAL_TABLET | Freq: Once | ORAL | Status: AC
Start: 2023-04-12 — End: 2023-04-12
  Administered 2023-04-12: 0.2 mg via ORAL

## 2023-04-12 MED ORDER — SODIUM CHLORIDE 0.9 % IV SOLN
150.0000 mg | Freq: Once | INTRAVENOUS | Status: AC
  Administered 2023-04-12: 150 mg via INTRAVENOUS
  Filled 2023-04-12: qty 150

## 2023-04-12 MED ORDER — FAMOTIDINE IN NACL 20-0.9 MG/50ML-% IV SOLN
20.0000 mg | Freq: Once | INTRAVENOUS | Status: AC
  Administered 2023-04-12: 20 mg via INTRAVENOUS
  Filled 2023-04-12: qty 50

## 2023-04-12 MED ORDER — TELMISARTAN 40 MG PO TABS
40.0000 mg | ORAL_TABLET | Freq: Every day | ORAL | 6 refills | Status: DC
  Filled 2023-04-12 – 2023-06-14 (×2): qty 30, 30d supply, fill #0

## 2023-04-12 MED ORDER — SODIUM CHLORIDE 0.9 % IV SOLN
1200.0000 mg | Freq: Once | INTRAVENOUS | Status: AC
  Administered 2023-04-12: 1200 mg via INTRAVENOUS
  Filled 2023-04-12: qty 20

## 2023-04-12 MED ORDER — SODIUM CHLORIDE 0.9% FLUSH
10.0000 mL | INTRAVENOUS | Status: DC | PRN
  Administered 2023-04-12 (×2): 10 mL

## 2023-04-12 MED ORDER — DEXAMETHASONE SODIUM PHOSPHATE 10 MG/ML IJ SOLN
10.0000 mg | Freq: Once | INTRAMUSCULAR | Status: AC
  Administered 2023-04-12: 10 mg via INTRAVENOUS
  Filled 2023-04-12: qty 1

## 2023-04-12 NOTE — Progress Notes (Signed)
 Hematology and Oncology Follow Up Visit  Christopher Henson [id number] 1981/03/04 43 y.o. 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  He is doing fairly well.  His Pleurx catheter has been taken out.  Hopefully, there is no real recurrence of the pleural effusion.  He still having some pain issues.  I really think he is going need some long-acting pain medication.  He did not do well on the fentanyl  patches.  I will try him on some OxyContin  (15 mg p.o. twice daily).  He has a decent appetite.  He has had no nausea or vomiting.  He has had no bleeding.  Is had no change in bowel or bladder habits.  He continues on Biktarvy  for his HIV.  This has been under very good control.   His blood pressure not doing too well.  He is going need to be on some blood pressure medication.  Of course, he does not have a family doctor.  As such, I will put him on telmisartan  (40 mg p.o. daily).  Currently, I would say that his performance status is probably ECOG 1.  Medications:  Current Outpatient Medications:    acetaminophen  (TYLENOL ) 325 MG tablet, Take 2 tablets (650 mg total) by mouth every 6 (six) hours as needed., Disp: , Rfl:    bictegravir-emtricitabine -tenofovir  AF (BIKTARVY ) 50-200-25 MG TABS tablet, Take 1 tablet by mouth daily., Disp: 30 tablet, Rfl: 0   dexamethasone  (DECADRON ) 4 MG tablet, Take 2 tablets (8mg ) by mouth daily starting the day after carboplatin  for 3 days. Take with food, Disp: 30 tablet, Rfl: 1   lidocaine -prilocaine  (EMLA ) cream, Apply to affected area once, Disp: 30 g, Rfl: 3   dronabinol  (MARINOL ) 5 MG capsule, Take 1 capsule (5 mg total) by mouth 2 (two)  times daily before a meal. (Patient not taking: Reported on 04/12/2023), Disp: 60 capsule, Rfl: 0   HYDROmorphone  (DILAUDID ) 4 MG tablet, Take 1 tablet (4 mg total) by mouth every 6 (six) hours as needed for severe pain (pain score 7-10). (Patient not taking: Reported on 04/12/2023), Disp: 90 tablet, Rfl: 0   ondansetron  (ZOFRAN ) 8 MG tablet, Take 1 tablet (8 mg total) by mouth every 8 (eight) hours as needed for nausea or vomiting. Start on the third day after carboplatin . (Patient not taking: Reported on 04/12/2023), Disp: 30 tablet, Rfl: 1   oxyCODONE  (OXY IR/ROXICODONE ) 5 MG immediate release tablet, Take 1 tablet (5 mg total) by mouth every 6 (six) hours as needed. (Patient not taking: Reported on 04/12/2023), Disp: 60 tablet, Rfl: 0   prochlorperazine  (COMPAZINE ) 10 MG tablet, Take 1 tablet (10 mg total) by mouth every 6 (six) hours as needed for nausea or vomiting. (Patient not taking: Reported on 04/12/2023), Disp: 30 tablet, Rfl: 1   senna-docusate (SENOKOT-S) 8.6-50 MG tablet, Take 1 tablet by mouth 2 (two) times daily between meals as needed for mild constipation. (Patient not taking: Reported on 04/12/2023), Disp: , Rfl:   Allergies: No Known Allergies  Past Medical History, Surgical history, Social history, and Family History were reviewed and updated.  Review of Systems: Review of Systems  Constitutional: Negative.   HENT:  Negative.    Eyes: Negative.   Respiratory:  Positive  for cough and shortness of breath.   Cardiovascular:  Positive for chest pain.  Gastrointestinal: Negative.   Endocrine: Negative.   Genitourinary: Negative.    Musculoskeletal: Negative.   Skin: Negative.   Neurological: Negative.   Hematological: Negative.   Psychiatric/Behavioral: Negative.      Physical Exam:  height is 6' (1.829 m) and weight is 175 lb 1.3 oz (79.4 kg). His oral temperature is 97.8 F (36.6 C). His blood pressure is 150/97 (abnormal) and his pulse is 101 (abnormal). His respiration is 20  and oxygen  saturation is 97%.   Wt Readings from Last 3 Encounters:  04/12/23 175 lb 1.3 oz (79.4 kg)  03/27/23 173 lb (78.5 kg)  03/21/23 175 lb 8 oz (79.6 kg)    Physical Exam Vitals reviewed.  HENT:     Head: Normocephalic and atraumatic.  Eyes:     Pupils: Pupils are equal, round, and reactive to light.  Cardiovascular:     Rate and Rhythm: Normal rate and regular rhythm.     Heart sounds: Normal heart sounds.     Comments: Cardiac exam is tachycardic but regular.  There are no murmurs. Pulmonary:     Effort: Pulmonary effort is normal.     Breath sounds: Normal breath sounds.     Comments: His lungs sound relatively clear bilaterally.  Maybe a little bit decreased and breath sounds are on the left lower lung. Abdominal:     General: Bowel sounds are normal.     Palpations: Abdomen is soft.     Comments: Abdominal exam is soft.  He has good bowel sounds.  There is no fluid wave.  There is no palpable liver or spleen tip.  Musculoskeletal:        General: No tenderness or deformity. Normal range of motion.     Cervical back: Normal range of motion.  Lymphadenopathy:     Cervical: No cervical adenopathy.  Skin:    General: Skin is warm and dry.     Findings: No erythema or rash.  Neurological:     Mental Status: He is alert and oriented to person, place, and time.  Psychiatric:        Behavior: Behavior normal.        Thought Content: Thought content normal.        Judgment: Judgment normal.     Lab Results  Component Value Date   WBC 6.0 04/12/2023   HGB 12.5 (L) 04/12/2023   HCT 34.8 (L) 04/12/2023   MCV 91.6 04/12/2023   PLT 255 04/12/2023     Chemistry      Component Value Date/Time   NA 139 04/12/2023 0842   NA 143 02/25/2019 1041   K 3.8 04/12/2023 0842   CL 103 04/12/2023 0842   CO2 29 04/12/2023 0842   BUN 10 04/12/2023 0842   BUN 8 02/25/2019 1041   CREATININE 0.95 04/12/2023 0842   CREATININE 0.89 12/14/2022 1132      Component Value  Date/Time   CALCIUM  9.3 04/12/2023 0842   ALKPHOS 84 04/12/2023 0842   AST 17 04/12/2023 0842   ALT 14 04/12/2023 0842   BILITOT 0.4 04/12/2023 0842       Impression and Plan: Christopher Henson is a very nice 43 year old African-American male.  He has what certainly is acting like a large cell neuroendocrine carcinoma.    We will go his fourth cycle of treatment.  I will probably do 1/5 cycle since he is tolerating treatment pretty  well.  After the fifth cycle, I will then scan him.  I will send in the OxyContin .  Hopefully this long-acting pain medication will help his chronic pain.  I am just happy that he seems to be doing fairly well.  We will plan to get him back to see us  in another 3 weeks.    Maude JONELLE Crease, MD 1/2/20259:13 AM

## 2023-04-12 NOTE — Progress Notes (Signed)
 Ok to [employer] before Avastin due to hypertension and administration of clonidine per Cletis Athens, PhD.

## 2023-04-12 NOTE — Patient Instructions (Signed)

## 2023-04-12 NOTE — Progress Notes (Signed)
 Clonidine 0.2 mg given for hypertension. Patient will receive chemotherapy today per Dr. [name].

## 2023-04-12 NOTE — Patient Instructions (Signed)
 [facility] - A DEPT OF [facility]  Discharge Instructions: Thank you for choosing [facility] to provide your oncology and hematology care.   If you have a lab appointment with the Cancer Center, please go directly to the Cancer Center and check in at the registration area.  Wear comfortable clothing and clothing appropriate for easy access to any Portacath or PICC line.   We strive to give you quality time with your provider. You may need to reschedule your appointment if you arrive late (15 or more minutes).  Arriving late affects you and other patients whose appointments are after yours.  Also, if you miss three or more appointments without notifying the office, you may be dismissed from the clinic at the provider's discretion.      For prescription refill requests, have your pharmacy contact our office and allow 72 hours for refills to be completed.    Today you received the following chemotherapy and/or immunotherapy agents Tecentriq/Avastin/Taxol/Carboplatin      To help prevent nausea and vomiting after your treatment, we encourage you to take your nausea medication as directed.  BELOW ARE SYMPTOMS THAT SHOULD BE REPORTED IMMEDIATELY: *FEVER GREATER THAN 100.4 F (38 C) OR HIGHER *CHILLS OR SWEATING *NAUSEA AND VOMITING THAT IS NOT CONTROLLED WITH YOUR NAUSEA MEDICATION *UNUSUAL SHORTNESS OF BREATH *UNUSUAL BRUISING OR BLEEDING *URINARY PROBLEMS (pain or burning when urinating, or frequent urination) *BOWEL PROBLEMS (unusual diarrhea, constipation, pain near the anus) TENDERNESS IN MOUTH AND THROAT WITH OR WITHOUT PRESENCE OF ULCERS (sore throat, sores in mouth, or a toothache) UNUSUAL RASH, SWELLING OR PAIN  UNUSUAL VAGINAL DISCHARGE OR ITCHING   Items with * indicate a potential emergency and should be followed up as soon as possible or go to the Emergency Department if any problems should occur.  Please show the CHEMOTHERAPY  ALERT CARD or IMMUNOTHERAPY ALERT CARD at check-in to the Emergency Department and triage nurse. Should you have questions after your visit or need to cancel or reschedule your appointment, please contact [facility] - A DEPT OF [facility]  [phone] and follow the prompts.  Office hours are 8:00 a.m. to 4:30 p.m. Monday - Friday. Please note that voicemails left after 4:00 p.m. may not be returned until the following business day.  We are closed weekends and major holidays. You have access to a nurse at all times for urgent questions. Please call the main number to the clinic [phone] and follow the prompts.  For any non-urgent questions, you may also contact your provider using MyChart. We now offer e-Visits for anyone 42 and older to request care online for non-urgent symptoms. For details visit [email or web address].   Also download the MyChart app! Go to the app store, search "MyChart", open the app, select [facility], and log in with your MyChart username and password.

## 2023-04-12 NOTE — Progress Notes (Signed)
 Patient blood pressure was high upon discharge, patientis asymptomatic. Dr. [name] aware.He advised the patient to see his PCP in regards his blood pressure. He will also sent a prescription for hypertension to the pharmacy downstairs.

## 2023-04-12 NOTE — Progress Notes (Signed)
 Patient proceeded with cycle four. He will need scans again after cycle five.   Oncology Nurse Navigator Documentation     04/12/2023   10:30 AM  Oncology Nurse Navigator Flowsheets  Navigator Follow Up Date: 05/03/2023  Navigator Follow Up Reason: Follow-up Appointment;Chemotherapy  Navigator Location [facility]  Navigator Encounter Type Treatment  Patient Visit Type MedOnc  Treatment Phase Active Tx  Barriers/Navigation Needs Coordination of Care;Education  Interventions None Required  Acuity Level 2-Minimal Needs (1-2 Barriers Identified)  Time Spent with Patient 15

## 2023-04-13 ENCOUNTER — Telehealth: Payer: Self-pay

## 2023-04-13 ENCOUNTER — Encounter: Payer: Self-pay | Admitting: Hematology & Oncology

## 2023-04-13 ENCOUNTER — Other Ambulatory Visit (HOSPITAL_BASED_OUTPATIENT_CLINIC_OR_DEPARTMENT_OTHER): Payer: Self-pay

## 2023-04-13 ENCOUNTER — Other Ambulatory Visit: Payer: Self-pay

## 2023-04-13 MED ORDER — VALSARTAN 80 MG PO TABS
80.0000 mg | ORAL_TABLET | Freq: Every day | ORAL | 1 refills | Status: DC
  Filled 2023-04-13: qty 30, 30d supply, fill #0
  Filled 2023-06-12: qty 30, 30d supply, fill #1

## 2023-04-13 NOTE — Telephone Encounter (Signed)
 Notified Patient of prior authorization approval for Oxycontin 15 mg Tablets. Medication is approved through 07/12/2023. Pharmacy notified. No other needs or concerns voiced at this time.

## 2023-04-15 ENCOUNTER — Other Ambulatory Visit: Payer: Self-pay

## 2023-04-16 ENCOUNTER — Inpatient Hospital Stay: Payer: Medicaid Other

## 2023-04-16 ENCOUNTER — Encounter (HOSPITAL_COMMUNITY): Payer: Self-pay | Admitting: Hematology & Oncology

## 2023-04-16 VITALS — BP 136/96 | HR 99 | Temp 98.6°F | Resp 20

## 2023-04-16 DIAGNOSIS — Z5112 Encounter for antineoplastic immunotherapy: Secondary | ICD-10-CM | POA: Diagnosis not present

## 2023-04-16 DIAGNOSIS — C7A1 Malignant poorly differentiated neuroendocrine tumors: Secondary | ICD-10-CM

## 2023-04-16 MED ORDER — PEGFILGRASTIM-JMDB 6 MG/0.6ML ~~LOC~~ SOSY
6.0000 mg | PREFILLED_SYRINGE | Freq: Once | SUBCUTANEOUS | Status: AC
Start: 2023-04-16 — End: 2023-04-16
  Administered 2023-04-16: 6 mg via SUBCUTANEOUS
  Filled 2023-04-16: qty 0.6

## 2023-04-16 NOTE — Patient Instructions (Signed)

## 2023-04-21 ENCOUNTER — Encounter: Payer: Self-pay | Admitting: Hematology & Oncology

## 2023-04-26 ENCOUNTER — Encounter: Payer: Self-pay | Admitting: Hematology & Oncology

## 2023-05-03 ENCOUNTER — Inpatient Hospital Stay: Payer: Medicaid Other

## 2023-05-03 ENCOUNTER — Other Ambulatory Visit: Payer: Self-pay

## 2023-05-03 ENCOUNTER — Encounter: Payer: Self-pay | Admitting: *Deleted

## 2023-05-03 ENCOUNTER — Inpatient Hospital Stay (HOSPITAL_BASED_OUTPATIENT_CLINIC_OR_DEPARTMENT_OTHER): Payer: Medicaid Other | Admitting: Hematology & Oncology

## 2023-05-03 ENCOUNTER — Encounter: Payer: Self-pay | Admitting: Hematology & Oncology

## 2023-05-03 VITALS — BP 138/81 | HR 72 | Resp 20

## 2023-05-03 VITALS — BP 145/91 | HR 109 | Temp 98.1°F | Resp 18 | Ht 72.0 in | Wt 175.0 lb

## 2023-05-03 DIAGNOSIS — C7A1 Malignant poorly differentiated neuroendocrine tumors: Secondary | ICD-10-CM

## 2023-05-03 DIAGNOSIS — Z5112 Encounter for antineoplastic immunotherapy: Secondary | ICD-10-CM | POA: Diagnosis not present

## 2023-05-03 LAB — CBC WITH DIFFERENTIAL (CANCER CENTER ONLY)
Abs Immature Granulocytes: 0.01 10*3/uL (ref 0.00–0.07)
Basophils Absolute: 0 10*3/uL (ref 0.0–0.1)
Basophils Relative: 1 %
Eosinophils Absolute: 0.1 10*3/uL (ref 0.0–0.5)
Eosinophils Relative: 2 %
HCT: 34.7 % — ABNORMAL LOW (ref 39.0–52.0)
Hemoglobin: 12.3 g/dL — ABNORMAL LOW (ref 13.0–17.0)
Immature Granulocytes: 0 %
Lymphocytes Relative: 25 %
Lymphs Abs: 1 10*3/uL (ref 0.7–4.0)
MCH: 33.3 pg (ref 26.0–34.0)
MCHC: 35.4 g/dL (ref 30.0–36.0)
MCV: 94 fL (ref 80.0–100.0)
Monocytes Absolute: 1 10*3/uL (ref 0.1–1.0)
Monocytes Relative: 23 %
Neutro Abs: 2 10*3/uL (ref 1.7–7.7)
Neutrophils Relative %: 49 %
Platelet Count: 253 10*3/uL (ref 150–400)
RBC: 3.69 MIL/uL — ABNORMAL LOW (ref 4.22–5.81)
RDW: 16.8 % — ABNORMAL HIGH (ref 11.5–15.5)
WBC Count: 4.2 10*3/uL (ref 4.0–10.5)
nRBC: 0 % (ref 0.0–0.2)

## 2023-05-03 LAB — CMP (CANCER CENTER ONLY)
ALT: 12 U/L (ref 0–44)
AST: 19 U/L (ref 15–41)
Albumin: 4.1 g/dL (ref 3.5–5.0)
Alkaline Phosphatase: 95 U/L (ref 38–126)
Anion gap: 7 (ref 5–15)
BUN: 11 mg/dL (ref 6–20)
CO2: 29 mmol/L (ref 22–32)
Calcium: 9.1 mg/dL (ref 8.9–10.3)
Chloride: 104 mmol/L (ref 98–111)
Creatinine: 0.98 mg/dL (ref 0.61–1.24)
GFR, Estimated: >60 mL/min (ref >60)
Glucose, Bld: 92 mg/dL (ref 70–99)
Potassium: 3.7 mmol/L (ref 3.5–5.1)
Sodium: 140 mmol/L (ref 135–145)
Total Bilirubin: 0.4 mg/dL (ref 0.0–1.2)
Total Protein: 6.8 g/dL (ref 6.5–8.1)

## 2023-05-03 LAB — IRON AND IRON BINDING CAPACITY (CC-WL,HP ONLY)
Iron: 83 ug/dL (ref 45–182)
Saturation Ratios: 23 % (ref 17.9–39.5)
TIBC: 356 ug/dL (ref 250–450)
UIBC: 273 ug/dL (ref 117–376)

## 2023-05-03 LAB — SAMPLE TO BLOOD BANK

## 2023-05-03 LAB — TOTAL PROTEIN, URINE DIPSTICK: Protein, ur: NEGATIVE mg/dL

## 2023-05-03 LAB — LACTATE DEHYDROGENASE: LDH: 933 U/L — ABNORMAL HIGH (ref 98–192)

## 2023-05-03 LAB — TSH: TSH: 1.733 u[IU]/mL (ref 0.350–4.500)

## 2023-05-03 LAB — FERRITIN: Ferritin: 558 ng/mL — ABNORMAL HIGH (ref 24–336)

## 2023-05-03 MED ORDER — PALONOSETRON HCL INJECTION 0.25 MG/5ML
0.2500 mg | Freq: Once | INTRAVENOUS | Status: AC
  Administered 2023-05-03: 0.25 mg via INTRAVENOUS
  Filled 2023-05-03: qty 5

## 2023-05-03 MED ORDER — SODIUM CHLORIDE 0.9 % IV SOLN
150.0000 mg | Freq: Once | INTRAVENOUS | Status: AC
  Administered 2023-05-03: 150 mg via INTRAVENOUS
  Filled 2023-05-03: qty 150

## 2023-05-03 MED ORDER — SODIUM CHLORIDE 0.9 % IV SOLN
15.0000 mg/kg | Freq: Once | INTRAVENOUS | Status: AC
  Administered 2023-05-03: 1100 mg via INTRAVENOUS
  Filled 2023-05-03: qty 32

## 2023-05-03 MED ORDER — CARBOPLATIN CHEMO INJECTION 600 MG/60ML
770.0000 mg | Freq: Once | INTRAVENOUS | Status: AC
  Administered 2023-05-03: 770 mg via INTRAVENOUS
  Filled 2023-05-03: qty 77

## 2023-05-03 MED ORDER — DIPHENHYDRAMINE HCL 50 MG/ML IJ SOLN
50.0000 mg | Freq: Once | INTRAMUSCULAR | Status: AC
  Administered 2023-05-03: 50 mg via INTRAVENOUS
  Filled 2023-05-03: qty 1

## 2023-05-03 MED ORDER — HEPARIN SOD (PORK) LOCK FLUSH 100 UNIT/ML IV SOLN
500.0000 [IU] | Freq: Once | INTRAVENOUS | Status: AC | PRN
  Administered 2023-05-03: 500 [IU]

## 2023-05-03 MED ORDER — SODIUM CHLORIDE 0.9% FLUSH
10.0000 mL | INTRAVENOUS | Status: DC | PRN
  Administered 2023-05-03: 10 mL

## 2023-05-03 MED ORDER — SODIUM CHLORIDE 0.9 % IV SOLN: INTRAVENOUS | Status: DC

## 2023-05-03 MED ORDER — FAMOTIDINE IN NACL 20-0.9 MG/50ML-% IV SOLN
20.0000 mg | Freq: Once | INTRAVENOUS | Status: AC
  Administered 2023-05-03: 20 mg via INTRAVENOUS
  Filled 2023-05-03: qty 50

## 2023-05-03 MED ORDER — SODIUM CHLORIDE 0.9 % IV SOLN
180.0000 mg/m2 | Freq: Once | INTRAVENOUS | Status: AC
  Administered 2023-05-03: 354 mg via INTRAVENOUS
  Filled 2023-05-03: qty 59

## 2023-05-03 MED ORDER — SODIUM CHLORIDE 0.9 % IV SOLN
1200.0000 mg | Freq: Once | INTRAVENOUS | Status: AC
  Administered 2023-05-03: 1200 mg via INTRAVENOUS
  Filled 2023-05-03: qty 20

## 2023-05-03 MED ORDER — DEXAMETHASONE SODIUM PHOSPHATE 10 MG/ML IJ SOLN
10.0000 mg | Freq: Once | INTRAMUSCULAR | Status: AC
  Administered 2023-05-03: 10 mg via INTRAVENOUS
  Filled 2023-05-03: qty 1

## 2023-05-03 NOTE — Patient Instructions (Signed)

## 2023-05-03 NOTE — Progress Notes (Signed)
Patient will proceed with cycle five. He will need a CT after this cycle.   Oncology Nurse Navigator Documentation     05/03/2023    8:15 AM  Oncology Nurse Navigator Flowsheets  Navigator Location [facility]  Navigator Encounter Type Treatment  Patient Visit Type MedOnc  Treatment Phase Active Tx  Barriers/Navigation Needs No Barriers At This Time  Interventions None Required  Acuity Level 2-Minimal Needs (1-2 Barriers Identified)  Time Spent with Patient 15

## 2023-05-03 NOTE — Patient Instructions (Signed)
[facility] - A DEPT OF [facility]  Discharge Instructions: Thank you for choosing [facility] to provide your oncology and hematology care.   If you have a lab appointment with the Cancer Center, please go directly to the Cancer Center and check in at the registration area.  Wear comfortable clothing and clothing appropriate for easy access to any Portacath or PICC line.   We strive to give you quality time with your provider. You may need to reschedule your appointment if you arrive late (15 or more minutes).  Arriving late affects you and other patients whose appointments are after yours.  Also, if you miss three or more appointments without notifying the office, you may be dismissed from the clinic at the provider's discretion.      For prescription refill requests, have your pharmacy contact our office and allow 72 hours for refills to be completed.    Today you received the following chemotherapy and/or immunotherapy agents Tecentriq/Avastin/Taxol/Carboplatin      To help prevent nausea and vomiting after your treatment, we encourage you to take your nausea medication as directed.  BELOW ARE SYMPTOMS THAT SHOULD BE REPORTED IMMEDIATELY: *FEVER GREATER THAN 100.4 F (38 C) OR HIGHER *CHILLS OR SWEATING *NAUSEA AND VOMITING THAT IS NOT CONTROLLED WITH YOUR NAUSEA MEDICATION *UNUSUAL SHORTNESS OF BREATH *UNUSUAL BRUISING OR BLEEDING *URINARY PROBLEMS (pain or burning when urinating, or frequent urination) *BOWEL PROBLEMS (unusual diarrhea, constipation, pain near the anus) TENDERNESS IN MOUTH AND THROAT WITH OR WITHOUT PRESENCE OF ULCERS (sore throat, sores in mouth, or a toothache) UNUSUAL RASH, SWELLING OR PAIN  UNUSUAL VAGINAL DISCHARGE OR ITCHING   Items with * indicate a potential emergency and should be followed up as soon as possible or go to the Emergency Department if any problems should occur.  Please show the CHEMOTHERAPY  ALERT CARD or IMMUNOTHERAPY ALERT CARD at check-in to the Emergency Department and triage nurse. Should you have questions after your visit or need to cancel or reschedule your appointment, please contact [facility] - A DEPT OF [facility]  [phone] and follow the prompts.  Office hours are 8:00 a.m. to 4:30 p.m. Monday - Friday. Please note that voicemails left after 4:00 p.m. may not be returned until the following business day.  We are closed weekends and major holidays. You have access to a nurse at all times for urgent questions. Please call the main number to the clinic [phone] and follow the prompts.  For any non-urgent questions, you may also contact your provider using MyChart. We now offer e-Visits for anyone 42 and older to request care online for non-urgent symptoms. For details visit [email or web address].   Also download the MyChart app! Go to the app store, search "MyChart", open the app, select [facility], and log in with your MyChart username and password.

## 2023-05-03 NOTE — Progress Notes (Signed)
Hematology and Oncology Follow Up Visit  Christopher Henson [id number] Oct 10, 1980 43 y.o. 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  So far, the pleural effusion on the left side has not come back.  He had the Pleurx catheter taken out about a month or so ago.  He is doing okay.  He has had no problems with chest pain.  Maybe a little discomfort over on the left lateral chest wall.  I think that the OxyContin has helped quite a bit.  He has had no change in bowel or bladder habits.  He has had no bleeding.  There is been no fever.  He has had no headache.  He has had no leg swelling.  His appetite is doing okay.  He continues on the Christopher Henson for his HIV.  Again, the HIV has not been a problem.  Overall, I would say his performance status is probably ECOG 1.    Medications:  Current Outpatient Medications:    acetaminophen (TYLENOL) 325 MG tablet, Take 2 tablets (650 mg total) by mouth every 6 (six) hours as needed., Disp: , Rfl:    bictegravir-emtricitabine-tenofovir AF (BIKTARVY) 50-200-25 MG TABS tablet, Take 1 tablet by mouth daily., Disp: 30 tablet, Rfl: 0   dexamethasone (DECADRON) 4 MG tablet, Take 2 tablets (8mg ) by mouth daily starting the day after carboplatin for 3 days. Take with food, Disp: 30 tablet, Rfl: 1   dronabinol (MARINOL) 5 MG capsule, Take 1 capsule (5 mg total) by mouth 2 (two) times daily before a meal. (Patient not taking: Reported on 04/12/2023), Disp: 60 capsule, Rfl: 0   lidocaine-prilocaine (EMLA) cream, Apply to affected area once, Disp: 30 g, Rfl: 3   ondansetron (ZOFRAN) 8 MG tablet, Take 1 tablet (8 mg total) by mouth every 8 (eight) hours as needed for nausea or vomiting. Start on the third day after carboplatin.  (Patient not taking: Reported on 04/12/2023), Disp: 30 tablet, Rfl: 1   oxyCODONE (OXY IR/ROXICODONE) 5 MG immediate release tablet, Take 1 tablet (5 mg total) by mouth every 6 (six) hours as needed., Disp: 60 tablet, Rfl: 0   oxyCODONE (OXYCONTIN) 15 mg 12 hr tablet, Take 1 tablet (15 mg total) by mouth every 12 (twelve) hours., Disp: 60 tablet, Rfl: 0   prochlorperazine (COMPAZINE) 10 MG tablet, Take 1 tablet (10 mg total) by mouth every 6 (six) hours as needed for nausea or vomiting. (Patient not taking: Reported on 04/12/2023), Disp: 30 tablet, Rfl: 1   senna-docusate (SENOKOT-S) 8.6-50 MG tablet, Take 1 tablet by mouth 2 (two) times daily between meals as needed for mild constipation. (Patient not taking: Reported on 04/12/2023), Disp: , Rfl:    telmisartan (MICARDIS) 40 MG tablet, Take 1 tablet (40 mg total) by mouth daily., Disp: 30 tablet, Rfl: 6   valsartan (DIOVAN) 80 MG tablet, Take 1 tablet (80 mg total) by mouth daily., Disp: 30 tablet, Rfl: 1  Allergies: No Known Allergies  Past Medical History, Surgical history, Social history, and Family History were reviewed and updated.  Review of Systems: Review of Systems  Constitutional: Negative.   HENT:  Negative.    Eyes: Negative.   Respiratory:  Positive for cough and shortness of breath.   Cardiovascular:  Positive for chest pain.  Gastrointestinal: Negative.   Endocrine: Negative.   Genitourinary: Negative.    Musculoskeletal: Negative.   Skin: Negative.   Neurological: Negative.   Hematological: Negative.   Psychiatric/Behavioral: Negative.      Physical Exam:  height is 6' (1.829 m) and weight is 175 lb (79.4 kg). His oral temperature is 98.1 F (36.7 C). His blood pressure is 145/91 (abnormal) and his pulse is 109 (abnormal). His respiration is 18 and oxygen saturation is 99%.   Wt Readings from Last 3 Encounters:  05/03/23 175 lb (79.4 kg)  04/12/23 175 lb 1.3 oz (79.4 kg)  03/27/23 173 lb (78.5 kg)    Physical  Exam Vitals reviewed.  HENT:     Head: Normocephalic and atraumatic.  Eyes:     Pupils: Pupils are equal, round, and reactive to light.  Cardiovascular:     Rate and Rhythm: Normal rate and regular rhythm.     Heart sounds: Normal heart sounds.     Comments: Cardiac exam is tachycardic but regular.  There are no murmurs. Pulmonary:     Effort: Pulmonary effort is normal.     Breath sounds: Normal breath sounds.     Comments: His lungs sound relatively clear bilaterally.  Maybe a little bit decreased and breath sounds are on the left lower lung. Abdominal:     General: Bowel sounds are normal.     Palpations: Abdomen is soft.     Comments: Abdominal exam is soft.  He has good bowel sounds.  There is no fluid wave.  There is no palpable liver or spleen tip.  Musculoskeletal:        General: No tenderness or deformity. Normal range of motion.     Cervical back: Normal range of motion.  Lymphadenopathy:     Cervical: No cervical adenopathy.  Skin:    General: Skin is warm and dry.     Findings: No erythema or rash.  Neurological:     Mental Status: He is alert and oriented to person, place, and time.  Psychiatric:        Behavior: Behavior normal.        Thought Content: Thought content normal.        Judgment: Judgment normal.      Lab Results  Component Value Date   WBC 4.2 05/03/2023   HGB 12.3 (L) 05/03/2023   HCT 34.7 (L) 05/03/2023   MCV 94.0 05/03/2023   PLT 253 05/03/2023     Chemistry      Component Value Date/Time   NA 139 04/12/2023 0842   NA 143 02/25/2019 1041   K 3.8 04/12/2023 0842   CL 103 04/12/2023 0842   CO2 29 04/12/2023 0842   BUN 10 04/12/2023 0842   BUN 8 02/25/2019 1041   CREATININE 0.95 04/12/2023 0842   CREATININE 0.89 12/14/2022 1132      Component Value Date/Time   CALCIUM 9.3 04/12/2023 0842   ALKPHOS 84 04/12/2023 0842   AST 17 04/12/2023 0842   ALT 14 04/12/2023 0842   BILITOT 0.4 04/12/2023 0842       Impression and  Plan: Christopher Henson is a very nice 43 year old African-American male.  He has what certainly is acting like a large cell neuroendocrine carcinoma.    We will go his 5th cycle of treatment.  After this cycle, we will do his scans and see how everything looks.  Hopefully, we will see that he is responding.  If you  see a nice response, then we will put him on some maintenance therapy.  I will plan to see him back in about a month.   Josph Macho, MD 1/23/20258:34 AM

## 2023-05-04 ENCOUNTER — Telehealth: Payer: Self-pay

## 2023-05-04 NOTE — Telephone Encounter (Signed)
Clinical Social Work was referred by Charity fundraiser for assessment of psychosocial needs.  CSW attempted to contact patient by phone.  Left voicemail with contact information and request for return call.

## 2023-05-05 ENCOUNTER — Other Ambulatory Visit: Payer: Self-pay

## 2023-05-05 DIAGNOSIS — J9859 Other diseases of mediastinum, not elsewhere classified: Secondary | ICD-10-CM | POA: Diagnosis not present

## 2023-05-05 DIAGNOSIS — B451 Cerebral cryptococcosis: Secondary | ICD-10-CM | POA: Diagnosis not present

## 2023-05-05 DIAGNOSIS — C3492 Malignant neoplasm of unspecified part of left bronchus or lung: Secondary | ICD-10-CM | POA: Diagnosis not present

## 2023-05-07 ENCOUNTER — Encounter: Payer: Self-pay | Admitting: Hematology & Oncology

## 2023-05-07 ENCOUNTER — Inpatient Hospital Stay: Payer: Medicaid Other

## 2023-05-07 DIAGNOSIS — C7A1 Malignant poorly differentiated neuroendocrine tumors: Secondary | ICD-10-CM

## 2023-05-08 ENCOUNTER — Encounter: Payer: Self-pay | Admitting: Hematology & Oncology

## 2023-05-10 ENCOUNTER — Other Ambulatory Visit: Payer: Self-pay | Admitting: Hematology & Oncology

## 2023-05-10 ENCOUNTER — Other Ambulatory Visit (HOSPITAL_COMMUNITY): Payer: Self-pay

## 2023-05-10 ENCOUNTER — Other Ambulatory Visit (HOSPITAL_BASED_OUTPATIENT_CLINIC_OR_DEPARTMENT_OTHER): Payer: Self-pay

## 2023-05-10 MED ORDER — OXYCODONE HCL 5 MG PO TABS
5.0000 mg | ORAL_TABLET | Freq: Four times a day (QID) | ORAL | 0 refills | Status: DC | PRN
  Filled 2023-05-10: qty 60, 15d supply, fill #0

## 2023-05-17 ENCOUNTER — Other Ambulatory Visit: Payer: Self-pay | Admitting: Hematology & Oncology

## 2023-05-17 ENCOUNTER — Other Ambulatory Visit (HOSPITAL_BASED_OUTPATIENT_CLINIC_OR_DEPARTMENT_OTHER): Payer: Self-pay

## 2023-05-17 MED ORDER — OXYCODONE HCL ER 15 MG PO T12A
15.0000 mg | EXTENDED_RELEASE_TABLET | Freq: Two times a day (BID) | ORAL | 0 refills | Status: DC
  Filled 2023-05-17: qty 60, 30d supply, fill #0

## 2023-05-28 ENCOUNTER — Ambulatory Visit (HOSPITAL_COMMUNITY)
Admission: RE | Admit: 2023-05-28 | Discharge: 2023-05-28 | Disposition: A | Discharge status: Home / Self Care | Payer: Medicaid Other | Source: Ambulatory Visit | Attending: Hematology & Oncology | Admitting: Hematology & Oncology

## 2023-05-28 DIAGNOSIS — C7A1 Malignant poorly differentiated neuroendocrine tumors: Secondary | ICD-10-CM | POA: Insufficient documentation

## 2023-05-28 DIAGNOSIS — J90 Pleural effusion, not elsewhere classified: Secondary | ICD-10-CM | POA: Diagnosis not present

## 2023-05-28 DIAGNOSIS — C7951 Secondary malignant neoplasm of bone: Secondary | ICD-10-CM | POA: Diagnosis not present

## 2023-05-28 DIAGNOSIS — J439 Emphysema, unspecified: Secondary | ICD-10-CM | POA: Diagnosis not present

## 2023-05-28 DIAGNOSIS — C3492 Malignant neoplasm of unspecified part of left bronchus or lung: Secondary | ICD-10-CM | POA: Diagnosis not present

## 2023-05-28 MED ORDER — IOHEXOL 300 MG/ML  SOLN
100.0000 mL | Freq: Once | INTRAMUSCULAR | Status: AC | PRN
  Administered 2023-05-28: 100 mL via INTRAVENOUS

## 2023-05-30 ENCOUNTER — Encounter: Payer: Self-pay | Admitting: *Deleted

## 2023-05-30 NOTE — Progress Notes (Signed)
 Reviewed CTs which show mixed response.   Oncology Nurse Navigator Documentation     05/30/2023    9:00 AM  Oncology Nurse Navigator Flowsheets  Navigator Follow Up Date: 05/31/2023  Navigator Follow Up Reason: Follow-up Appointment  Navigator Location [facility]  Navigator Encounter Type Scan Review  Patient Visit Type MedOnc  Treatment Phase Active Tx  Barriers/Navigation Needs No Barriers At This Time  Interventions None Required  Acuity Level 2-Minimal Needs (1-2 Barriers Identified)  Time Spent with Patient 15

## 2023-05-31 ENCOUNTER — Inpatient Hospital Stay: Payer: Medicaid Other

## 2023-05-31 ENCOUNTER — Inpatient Hospital Stay: Payer: Medicaid Other | Attending: Hematology & Oncology

## 2023-05-31 ENCOUNTER — Inpatient Hospital Stay (HOSPITAL_BASED_OUTPATIENT_CLINIC_OR_DEPARTMENT_OTHER): Payer: Medicaid Other | Admitting: Hematology & Oncology

## 2023-05-31 ENCOUNTER — Encounter: Payer: Self-pay | Admitting: *Deleted

## 2023-05-31 ENCOUNTER — Other Ambulatory Visit: Payer: Self-pay

## 2023-05-31 ENCOUNTER — Other Ambulatory Visit (HOSPITAL_BASED_OUTPATIENT_CLINIC_OR_DEPARTMENT_OTHER): Payer: Self-pay

## 2023-05-31 ENCOUNTER — Other Ambulatory Visit: Payer: Self-pay | Admitting: *Deleted

## 2023-05-31 ENCOUNTER — Encounter: Payer: Self-pay | Admitting: Hematology & Oncology

## 2023-05-31 VITALS — BP 136/90 | HR 99 | Resp 18

## 2023-05-31 VITALS — BP 141/97 | HR 98 | Temp 98.0°F | Resp 20 | Ht 72.0 in | Wt 175.0 lb

## 2023-05-31 DIAGNOSIS — C7A1 Malignant poorly differentiated neuroendocrine tumors: Secondary | ICD-10-CM

## 2023-05-31 DIAGNOSIS — Z21 Asymptomatic human immunodeficiency virus [HIV] infection status: Secondary | ICD-10-CM | POA: Diagnosis not present

## 2023-05-31 DIAGNOSIS — Z5111 Encounter for antineoplastic chemotherapy: Secondary | ICD-10-CM | POA: Diagnosis present

## 2023-05-31 DIAGNOSIS — Z5112 Encounter for antineoplastic immunotherapy: Secondary | ICD-10-CM | POA: Diagnosis present

## 2023-05-31 DIAGNOSIS — Z5189 Encounter for other specified aftercare: Secondary | ICD-10-CM | POA: Insufficient documentation

## 2023-05-31 LAB — CBC WITH DIFFERENTIAL (CANCER CENTER ONLY)
Abs Immature Granulocytes: 0.01 10*3/uL (ref 0.00–0.07)
Basophils Absolute: 0.1 10*3/uL (ref 0.0–0.1)
Basophils Relative: 1 %
Eosinophils Absolute: 2.4 10*3/uL — ABNORMAL HIGH (ref 0.0–0.5)
Eosinophils Relative: 30 %
HCT: 35.6 % — ABNORMAL LOW (ref 39.0–52.0)
Hemoglobin: 12.9 g/dL — ABNORMAL LOW (ref 13.0–17.0)
Immature Granulocytes: 0 %
Lymphocytes Relative: 19 %
Lymphs Abs: 1.5 10*3/uL (ref 0.7–4.0)
MCH: 34.7 pg — ABNORMAL HIGH (ref 26.0–34.0)
MCHC: 36.2 g/dL — ABNORMAL HIGH (ref 30.0–36.0)
MCV: 95.7 fL (ref 80.0–100.0)
Monocytes Absolute: 0.7 10*3/uL (ref 0.1–1.0)
Monocytes Relative: 9 %
Neutro Abs: 3.4 10*3/uL (ref 1.7–7.7)
Neutrophils Relative %: 41 %
Platelet Count: 121 10*3/uL — ABNORMAL LOW (ref 150–400)
RBC: 3.72 MIL/uL — ABNORMAL LOW (ref 4.22–5.81)
RDW: 16 % — ABNORMAL HIGH (ref 11.5–15.5)
Smear Review: NORMAL
WBC Count: 8.1 10*3/uL (ref 4.0–10.5)
nRBC: 0 % (ref 0.0–0.2)

## 2023-05-31 LAB — CMP (CANCER CENTER ONLY)
ALT: 11 U/L (ref 0–44)
AST: 17 U/L (ref 15–41)
Albumin: 4.1 g/dL (ref 3.5–5.0)
Alkaline Phosphatase: 84 U/L (ref 38–126)
Anion gap: 7 (ref 5–15)
BUN: 14 mg/dL (ref 6–20)
CO2: 29 mmol/L (ref 22–32)
Calcium: 9.8 mg/dL (ref 8.9–10.3)
Chloride: 103 mmol/L (ref 98–111)
Creatinine: 1.1 mg/dL (ref 0.61–1.24)
GFR, Estimated: >60 mL/min (ref >60)
Glucose, Bld: 127 mg/dL — ABNORMAL HIGH (ref 70–99)
Potassium: 3.9 mmol/L (ref 3.5–5.1)
Sodium: 139 mmol/L (ref 135–145)
Total Bilirubin: 0.5 mg/dL (ref 0.0–1.2)
Total Protein: 6.6 g/dL (ref 6.5–8.1)

## 2023-05-31 LAB — LACTATE DEHYDROGENASE: LDH: 990 U/L — ABNORMAL HIGH (ref 98–192)

## 2023-05-31 MED ORDER — SODIUM CHLORIDE 0.9 % IV SOLN
760.0000 mg | Freq: Once | INTRAVENOUS | Status: DC
  Administered 2023-05-31: 760 mg via INTRAVENOUS
  Filled 2023-05-31: qty 76

## 2023-05-31 MED ORDER — PALONOSETRON HCL INJECTION 0.25 MG/5ML
0.2500 mg | Freq: Once | INTRAVENOUS | Status: AC
  Administered 2023-05-31: 0.25 mg via INTRAVENOUS
  Filled 2023-05-31: qty 5

## 2023-05-31 MED ORDER — DEXAMETHASONE SODIUM PHOSPHATE 10 MG/ML IJ SOLN
10.0000 mg | Freq: Once | INTRAMUSCULAR | Status: AC
  Administered 2023-05-31: 10 mg via INTRAVENOUS
  Filled 2023-05-31: qty 1

## 2023-05-31 MED ORDER — SODIUM CHLORIDE 0.9 % IV SOLN: INTRAVENOUS | Status: DC

## 2023-05-31 MED ORDER — OXYCODONE HCL 5 MG PO TABS
5.0000 mg | ORAL_TABLET | Freq: Four times a day (QID) | ORAL | 0 refills | Status: DC | PRN
  Filled 2023-05-31: qty 60, 15d supply, fill #0

## 2023-05-31 MED ORDER — SODIUM CHLORIDE 0.9 % IV SOLN
1200.0000 mg | Freq: Once | INTRAVENOUS | Status: AC
  Administered 2023-05-31: 1200 mg via INTRAVENOUS
  Filled 2023-05-31: qty 20

## 2023-05-31 MED ORDER — SODIUM CHLORIDE 0.9 % IV SOLN
180.0000 mg/m2 | Freq: Once | INTRAVENOUS | Status: AC
  Administered 2023-05-31: 354 mg via INTRAVENOUS
  Filled 2023-05-31: qty 59

## 2023-05-31 MED ORDER — SODIUM CHLORIDE 0.9% FLUSH
10.0000 mL | INTRAVENOUS | Status: DC | PRN
  Administered 2023-05-31: 10 mL via INTRAVENOUS

## 2023-05-31 MED ORDER — SODIUM CHLORIDE 0.9 % IV SOLN
150.0000 mg | Freq: Once | INTRAVENOUS | Status: AC
  Administered 2023-05-31: 150 mg via INTRAVENOUS
  Filled 2023-05-31: qty 150

## 2023-05-31 MED ORDER — HEPARIN SOD (PORK) LOCK FLUSH 100 UNIT/ML IV SOLN: 500.0000 [IU] | Freq: Once | INTRAVENOUS | Status: DC | PRN

## 2023-05-31 MED ORDER — SODIUM CHLORIDE 0.9% FLUSH: 10.0000 mL | INTRAVENOUS | Status: DC | PRN

## 2023-05-31 MED ORDER — DIPHENHYDRAMINE HCL 50 MG/ML IJ SOLN
50.0000 mg | Freq: Once | INTRAMUSCULAR | Status: AC
  Administered 2023-05-31: 50 mg via INTRAVENOUS
  Filled 2023-05-31: qty 1

## 2023-05-31 MED ORDER — HEPARIN SOD (PORK) LOCK FLUSH 100 UNIT/ML IV SOLN
500.0000 [IU] | Freq: Once | INTRAVENOUS | Status: AC
  Administered 2023-05-31: 500 [IU] via INTRAVENOUS

## 2023-05-31 MED ORDER — SODIUM CHLORIDE 0.9 % IV SOLN
15.0000 mg/kg | Freq: Once | INTRAVENOUS | Status: AC
  Administered 2023-05-31: 1100 mg via INTRAVENOUS
  Filled 2023-05-31: qty 32

## 2023-05-31 MED ORDER — FAMOTIDINE IN NACL 20-0.9 MG/50ML-% IV SOLN
20.0000 mg | Freq: Once | INTRAVENOUS | Status: AC
  Administered 2023-05-31: 20 mg via INTRAVENOUS
  Filled 2023-05-31: qty 50

## 2023-05-31 NOTE — Progress Notes (Unsigned)
 Due to new lytic lesions, patient will see RadOnc for consideration of radiation. Consultation scheduled for 06/06/2023.He will finish chemo regimen today. At this time, unknown future treatment plans. Will follow up with patient once finished with radiation.  Oncology Nurse Navigator Documentation     05/31/2023    1:00 PM  Oncology Nurse Navigator Flowsheets  Navigator Follow Up Date: 06/27/2023  Navigator Follow Up Reason: Follow-up Appointment;Chemotherapy  Navigator Location [facility]  Navigator Encounter Type Follow-up Appt  Patient Visit Type MedOnc  Treatment Phase Active Tx  Barriers/Navigation Needs No Barriers At This Time  Interventions Referrals  Acuity Level 1-No Barriers  Referrals Radiation Oncology  Time Spent with Patient 15

## 2023-05-31 NOTE — Progress Notes (Addendum)
 Hematology and Oncology Follow Up Visit  Christopher Henson [id number] [birth year] 43 y.o. 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  Christopher Henson feels okay.  No specific complaints.  Christopher Henson does have little bit of chest discomfort.  Christopher Henson is on OxyContin for long-term pain control.  We did do a recent CT scan on him.  This was done on 05/28/2023.  Christopher Henson had decreased in the mediastinal mass.  However, there appeared to be some new lesions at L1 and L2.  Christopher Henson is also noted to have a large left adrenal nodule measuring 1.8 x 1.2 cm.  Christopher Henson does not have any back pain.  However, I did speak with Dr. Roselind Messier of Radiation Oncology.  I think that palliative radiotherapy would not be a bad idea for him.  This will be his sixth cycle of treatment.  After this cycle, we will have to figure out how we can try to treat him.  I worry that we just do maintenance therapy, we will not get a response.  We may have to think about second line therapy.  His appetite is okay.  Christopher Henson has had no nausea or vomiting.  Christopher Henson has a little bit of diarrhea.  I told him to take some Imodium for Pepto-Bismol for this.  Christopher Henson has had no fever.  Christopher Henson has had no bleeding.  Christopher Henson says Christopher Henson does have occasional nosebleed.  Christopher Henson has had no leg swelling.  His HIV seems to be under very good control  So far, the pleural effusion on the left side has not come back.  Christopher Henson had the Pleurx catheter taken out about a month or so ago.  Overall, the performance status is ECOG 1.     Medications:  Current Outpatient Medications:    acetaminophen (TYLENOL) 325 MG tablet, Take 2 tablets (650 mg total) by mouth every 6 (six) hours as needed., Disp: , Rfl:     bictegravir-emtricitabine-tenofovir AF (BIKTARVY) 50-200-25 MG TABS tablet, Take 1 tablet by mouth daily., Disp: 30 tablet, Rfl: 0   dexamethasone (DECADRON) 4 MG tablet, Take 2 tablets (8mg ) by mouth daily starting the day after carboplatin for 3 days. Take with food, Disp: 30 tablet, Rfl: 1   dronabinol (MARINOL) 5 MG capsule, Take 1 capsule (5 mg total) by mouth 2 (two) times daily before a meal. (Patient not taking: Reported on 04/12/2023), Disp: 60 capsule, Rfl: 0   lidocaine-prilocaine (EMLA) cream, Apply to affected area once, Disp: 30 g, Rfl: 3   ondansetron (ZOFRAN) 8 MG tablet, Take 1 tablet (8 mg total) by mouth every 8 (eight) hours as needed for nausea or vomiting. Start on the third day after carboplatin. (Patient not taking: Reported on 04/12/2023), Disp: 30 tablet, Rfl: 1   oxyCODONE (OXY IR/ROXICODONE) 5 MG immediate release tablet, Take 1 tablet (5 mg total) by mouth every 6 (six) hours as needed., Disp: 60 tablet, Rfl: 0   oxyCODONE (OXYCONTIN) 15 mg 12 hr tablet, Take 1 tablet (15 mg total) by mouth every 12 (twelve) hours., Disp: 60 tablet, Rfl: 0   prochlorperazine (COMPAZINE) 10 MG tablet, Take 1 tablet (10 mg total) by mouth every 6 (six) hours as  needed for nausea or vomiting. (Patient not taking: Reported on 04/12/2023), Disp: 30 tablet, Rfl: 1   senna-docusate (SENOKOT-S) 8.6-50 MG tablet, Take 1 tablet by mouth 2 (two) times daily between meals as needed for mild constipation. (Patient not taking: Reported on 04/12/2023), Disp: , Rfl:    telmisartan (MICARDIS) 40 MG tablet, Take 1 tablet (40 mg total) by mouth daily., Disp: 30 tablet, Rfl: 6   valsartan (DIOVAN) 80 MG tablet, Take 1 tablet (80 mg total) by mouth daily., Disp: 30 tablet, Rfl: 1  Allergies: No Known Allergies  Past Medical History, Surgical history, Social history, and Family History were reviewed and updated.  Review of Systems: Review of Systems  Constitutional: Negative.   HENT:  Negative.    Eyes: Negative.    Respiratory:  Positive for cough and shortness of breath.   Cardiovascular:  Positive for chest pain.  Gastrointestinal: Negative.   Endocrine: Negative.   Genitourinary: Negative.    Musculoskeletal: Negative.   Skin: Negative.   Neurological: Negative.   Hematological: Negative.   Psychiatric/Behavioral: Negative.      Physical Exam:  Temperature is 99.2.  Pulse 98.  Blood pressure 141/97.  Weight is 175 pounds.  Wt Readings from Last 3 Encounters:  05/03/23 175 lb (79.4 kg)  04/12/23 175 lb 1.3 oz (79.4 kg)  03/27/23 173 lb (78.5 kg)    Physical Exam Vitals reviewed.  HENT:     Head: Normocephalic and atraumatic.  Eyes:     Pupils: Pupils are equal, round, and reactive to light.  Cardiovascular:     Rate and Rhythm: Normal rate and regular rhythm.     Heart sounds: Normal heart sounds.     Comments: Cardiac exam is tachycardic but regular.  There are no murmurs. Pulmonary:     Effort: Pulmonary effort is normal.     Breath sounds: Normal breath sounds.     Comments: His lungs sound relatively clear bilaterally.  Maybe a little bit decreased and breath sounds are on the left lower lung. Abdominal:     General: Bowel sounds are normal.     Palpations: Abdomen is soft.     Comments: Abdominal exam is soft.  Christopher Henson has good bowel sounds.  There is no fluid wave.  There is no palpable liver or spleen tip.  Musculoskeletal:        General: No tenderness or deformity. Normal range of motion.     Cervical back: Normal range of motion.  Lymphadenopathy:     Cervical: No cervical adenopathy.  Skin:    General: Skin is warm and dry.     Findings: No erythema or rash.  Neurological:     Mental Status: Christopher Henson is alert and oriented to person, place, and time.  Psychiatric:        Behavior: Behavior normal.        Thought Content: Thought content normal.        Judgment: Judgment normal.     Lab Results  Component Value Date   WBC 4.2 05/03/2023   HGB 12.3 (L) 05/03/2023    HCT 34.7 (L) 05/03/2023   MCV 94.0 05/03/2023   PLT 253 05/03/2023     Chemistry      Component Value Date/Time   NA 140 05/03/2023 0805   NA 143 02/25/2019 1041   K 3.7 05/03/2023 0805   CL 104 05/03/2023 0805   CO2 29 05/03/2023 0805   BUN 11 05/03/2023 0805   BUN 8 02/25/2019 1041  CREATININE 0.98 05/03/2023 0805   CREATININE 0.89 12/14/2022 1132      Component Value Date/Time   CALCIUM 9.1 05/03/2023 0805   ALKPHOS 95 05/03/2023 0805   AST 19 05/03/2023 0805   ALT 12 05/03/2023 0805   BILITOT 0.4 05/03/2023 0805       Impression and Plan: Christopher Henson is a very nice 43 year old African-American male.  Christopher Henson has what certainly is acting like a large cell neuroendocrine carcinoma.    We will go his 6th cycle of treatment.  After this cycle, we we will see about making a change in protocol.  I will have to figure out what might be a good protocol for him.  I did speak with Radiation Oncology and get him in for radiotherapy.  This would be a good idea for his back.  We will also need to add a bisphosphonate.  Again, we are looking at quality of life.  I just want to have a decent quality of life.  I will have him come back to see [place] in about a month.  Then, Christopher Henson should be finished up with his radiotherapy.     Josph Macho, MD 2/20/20259:06 AM

## 2023-05-31 NOTE — Addendum Note (Signed)
 Addended by: Josph Macho on: 05/31/2023 03:17 PM   Modules accepted: Orders

## 2023-05-31 NOTE — Progress Notes (Signed)
 Histology and Location of Primary Cancer: Left lung  Sites of Visceral and Bony Metastatic Disease: L1 and L2   Location(s) of Symptomatic Metastases:   Past/Anticipated chemotherapy by medical oncology, if any:      Pain on a scale of 0-10 is: 4 patient reports having left side chest pain. Patient reports pain is constant. Patient reports this pain has been going on for a few months. Patient also reports shortness of breath on exertion.    If Spine Met(s), symptoms, if any, include: Bowel/Bladder retention or incontinence (please describe): No Numbness or weakness in extremities (please describe): No Current Decadron regimen, if applicable: No  Ambulatory status? Walker? Wheelchair?: Ambulatory  SAFETY ISSUES: Prior radiation? no Pacemaker/ICD? no Possible current pregnancy? no Is the patient on methotrexate? no  Current Complaints / other details:    BP (!) 135/94 (BP Location: Right Arm, Patient Position: Sitting, Cuff Size: Normal)   Pulse (!) 113   Temp (!) 97.5 F (36.4 C)   Resp 18   Ht 6' (1.829 m)   Wt 179 lb (81.2 kg)   SpO2 100%   BMI 24.28 kg/m

## 2023-05-31 NOTE — Patient Instructions (Signed)
 [facility] - A DEPT OF [facility]  Discharge Instructions: Thank you for choosing [facility] to provide your oncology and hematology care.   If you have a lab appointment with the Cancer Center, please go directly to the Cancer Center and check in at the registration area.  Wear comfortable clothing and clothing appropriate for easy access to any Portacath or PICC line.   We strive to give you quality time with your provider. You may need to reschedule your appointment if you arrive late (15 or more minutes).  Arriving late affects you and other patients whose appointments are after yours.  Also, if you miss three or more appointments without notifying the office, you may be dismissed from the clinic at the provider's discretion.      For prescription refill requests, have your pharmacy contact our office and allow 72 hours for refills to be completed.    Today you received the following chemotherapy and/or immunotherapy agents:  Mvasi, Taxol, Carboplatin and Tecentriq      To help prevent nausea and vomiting after your treatment, we encourage you to take your nausea medication as directed.  BELOW ARE SYMPTOMS THAT SHOULD BE REPORTED IMMEDIATELY: *FEVER GREATER THAN 100.4 F (38 C) OR HIGHER *CHILLS OR SWEATING *NAUSEA AND VOMITING THAT IS NOT CONTROLLED WITH YOUR NAUSEA MEDICATION *UNUSUAL SHORTNESS OF BREATH *UNUSUAL BRUISING OR BLEEDING *URINARY PROBLEMS (pain or burning when urinating, or frequent urination) *BOWEL PROBLEMS (unusual diarrhea, constipation, pain near the anus) TENDERNESS IN MOUTH AND THROAT WITH OR WITHOUT PRESENCE OF ULCERS (sore throat, sores in mouth, or a toothache) UNUSUAL RASH, SWELLING OR PAIN  UNUSUAL VAGINAL DISCHARGE OR ITCHING   Items with * indicate a potential emergency and should be followed up as soon as possible or go to the Emergency Department if any problems should occur.  Please show the  CHEMOTHERAPY ALERT CARD or IMMUNOTHERAPY ALERT CARD at check-in to the Emergency Department and triage nurse. Should you have questions after your visit or need to cancel or reschedule your appointment, please contact [facility] - A DEPT OF [facility]  [phone] and follow the prompts.  Office hours are 8:00 a.m. to 4:30 p.m. Monday - Friday. Please note that voicemails left after 4:00 p.m. may not be returned until the following business day.  We are closed weekends and major holidays. You have access to a nurse at all times for urgent questions. Please call the main number to the clinic [phone] and follow the prompts.  For any non-urgent questions, you may also contact your provider using MyChart. We now offer e-Visits for anyone 67 and older to request care online for non-urgent symptoms. For details visit [email or web address].   Also download the MyChart app! Go to the app store, search "MyChart", open the app, select [facility], and log in with your MyChart username and password.

## 2023-05-31 NOTE — Progress Notes (Signed)
 Ok to treat with BP of 141/97 and pending ANC results per order of Dr. [name].

## 2023-06-01 ENCOUNTER — Encounter: Payer: Self-pay | Admitting: Hematology & Oncology

## 2023-06-04 ENCOUNTER — Inpatient Hospital Stay: Payer: Medicaid Other

## 2023-06-04 DIAGNOSIS — C7A1 Malignant poorly differentiated neuroendocrine tumors: Secondary | ICD-10-CM

## 2023-06-04 NOTE — Progress Notes (Signed)
 Radiation Oncology         [phone] ________________________________  Initial {In/Out}patient Consultation  Name: Christopher Henson MRN: [id number]  Date: 06/06/2023  DOB: 12/22/1980  CC:Tysinger, Kermit Balo, PA-C  Josph Macho, MD   REFERRING PHYSICIAN: Josph Macho, MD  DIAGNOSIS: There were no encounter diagnoses.  {diagnosis}  HISTORY OF PRESENT ILLNESS::Christopher Henson is a 43 y.o. male who is accompanied by ***. he is seen as a courtesy of *** for an opinion concerning radiation therapy as part of management for his recently diagnosed ***. The patient presented to *** on *** with ***.  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   GERD (gastroesophageal reflux disease)    History of cryptococcal meningitis    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   Protein calorie malnutrition (HCC)    Small cell lung cancer, 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   Thrombocytopenia (HCC) 06/2018    PAST SURGICAL HISTORY: Past Surgical History:  Procedure Laterality Date   BIOPSY  12/21/2022   Procedure: BIOPSY;  Surgeon: Tomma Lightning, MD;  Location: [facility];  Service: Endoscopy;;   BRONCHIAL WASHINGS  12/21/2022   Procedure: BRONCHIAL WASHINGS;  Surgeon: Tomma Lightning, MD;  Location: [facility];  Service: Endoscopy;;   ENDOBRONCHIAL ULTRASOUND Left 12/21/2022   Procedure: ENDOBRONCHIAL ULTRASOUND;  Surgeon: Tomma Lightning, MD;  Location: [facility];  Service: Endoscopy;  Laterality: Left;  Endobronchial ultrasound, biopsy, need fluoroscopy   FINE NEEDLE ASPIRATION  12/21/2022   Procedure: FINE NEEDLE ASPIRATION (FNA) LINEAR;  Surgeon: Tomma Lightning, MD;  Location: [facility];  Service: Endoscopy;;   IR BONE MARROW BIOPSY & ASPIRATION  12/27/2022   IR IMAGING GUIDED PORT INSERTION  12/27/2022   IR LUMBAR PUNCTURE  12/22/2022   IR PERC PLEURAL DRAIN W/INDWELL CATH W/IMG GUIDE  01/15/2023   IR REMOVAL OF PLURAL CATH W/CUFF  03/27/2023   LUMBAR PUNCTURE  06/2018   during eval for meningitis    VIDEO BRONCHOSCOPY Left 12/21/2022   Procedure: VIDEO BRONCHOSCOPY WITH FLUORO, endobronchial ultrasound;  Surgeon: Tomma Lightning, MD;  Location: [facility];  Service: Endoscopy;  Laterality: Left;  Schedule for 12/20/2022 12/21/2022    FAMILY HISTORY:  Family History  Problem Relation Age of Onset   Diabetes Mother    Hypertension Mother    Diabetes Father    Hypertension Father    Healthy Sister    Healthy Brother    Healthy Sister    Healthy Sister    Healthy Sister    Healthy Brother    Healthy Brother    Heart disease Neg Hx    Cancer Neg Hx    Stroke Neg Hx     SOCIAL HISTORY:  Social History   Tobacco Use   Smoking status: Former    Current packs/day: 0.00    Types: Cigarettes    Quit date: 12/18/2022    Years since quitting: 0.4   Smokeless tobacco: Never  Vaping Use   Vaping status: Former  Substance Use Topics   Alcohol use: Yes    Comment: 1/2 pint on weekend ; liquor   Drug use: Yes    Frequency: 7.0 times per week  Types: Marijuana    Comment: daily    ALLERGIES: No Known Allergies  MEDICATIONS:  Current Outpatient Medications  Medication Sig Dispense Refill   acetaminophen (TYLENOL) 325 MG tablet Take 2 tablets (650 mg total) by mouth every 6 (six) hours as needed.     bictegravir-emtricitabine-tenofovir AF (BIKTARVY) 50-200-25 MG TABS tablet Take 1 tablet by mouth daily. 30 tablet 0   dronabinol (MARINOL) 5 MG capsule Take 1 capsule (5 mg total) by mouth 2 (two) times daily before a meal. (Patient not taking: Reported on 04/12/2023) 60 capsule 0   oxyCODONE (OXY IR/ROXICODONE) 5 MG immediate release tablet Take 1 tablet  (5 mg total) by mouth every 6 (six) hours as needed. 60 tablet 0   oxyCODONE (OXYCONTIN) 15 mg 12 hr tablet Take 1 tablet (15 mg total) by mouth every 12 (twelve) hours. 60 tablet 0   senna-docusate (SENOKOT-S) 8.6-50 MG tablet Take 1 tablet by mouth 2 (two) times daily between meals as needed for mild constipation. (Patient not taking: Reported on 04/12/2023)     telmisartan (MICARDIS) 40 MG tablet Take 1 tablet (40 mg total) by mouth daily. 30 tablet 6   valsartan (DIOVAN) 80 MG tablet Take 1 tablet (80 mg total) by mouth daily. 30 tablet 1   No current facility-administered medications for this encounter.    REVIEW OF SYSTEMS:  A 10+ POINT REVIEW OF SYSTEMS WAS OBTAINED including neurology, dermatology, psychiatry, cardiac, respiratory, lymph, extremities, GI, GU, musculoskeletal, constitutional, reproductive, HEENT. ***   PHYSICAL EXAM:  vitals were not taken for this visit.   General: Alert and oriented, in no acute distress HEENT: Head is normocephalic. Extraocular movements are intact. Oropharynx is clear. Neck: Neck is supple, no palpable cervical or supraclavicular lymphadenopathy. Heart: Regular in rate and rhythm with no murmurs, rubs, or gallops. Chest: Clear to auscultation bilaterally, with no rhonchi, wheezes, or rales. Abdomen: Soft, nontender, nondistended, with no rigidity or guarding. Extremities: No cyanosis or edema. Lymphatics: see Neck Exam Skin: No concerning lesions. Musculoskeletal: symmetric strength and muscle tone throughout. Neurologic: Cranial nerves II through XII are grossly intact. No obvious focalities. Speech is fluent. Coordination is intact. Psychiatric: Judgment and insight are intact. Affect is appropriate. ***  ECOG = ***  0 - Asymptomatic (Fully active, able to carry on all predisease activities without restriction)  1 - Symptomatic but completely ambulatory (Restricted in physically strenuous activity but ambulatory and able to carry out work of  a light or sedentary nature. For example, light housework, office work)  2 - Symptomatic, <50% in bed during the day (Ambulatory and capable of all self care but unable to carry out any work activities. Up and about more than 50% of waking hours)  3 - Symptomatic, >50% in bed, but not bedbound (Capable of only limited self-care, confined to bed or chair 50% or more of waking hours)  4 - Bedbound (Completely disabled. Cannot carry on any self-care. Totally confined to bed or chair)  5 - Death   Santiago Glad MM, Creech RH, Tormey DC, et al. [phone]). "Toxicity and response criteria of the [facility] Group". Am. Evlyn Clines. Oncol. 5 (6): 649-55  LABORATORY DATA:  Lab Results  Component Value Date   WBC 8.1 05/31/2023   HGB 12.9 (L) 05/31/2023   HCT 35.6 (L) 05/31/2023   MCV 95.7 05/31/2023   PLT 121 (L) 05/31/2023   NEUTROABS 3.4 05/31/2023   Lab Results  Component Value Date   NA 139 05/31/2023   K  3.9 05/31/2023   CL 103 05/31/2023   CO2 29 05/31/2023   GLUCOSE 127 (H) 05/31/2023   BUN 14 05/31/2023   CREATININE 1.10 05/31/2023   CALCIUM 9.8 05/31/2023      RADIOGRAPHY: CT CHEST ABDOMEN PELVIS W CONTRAST Result Date: 05/28/2023 CLINICAL DATA:  Metastatic non-small cell lung cancer restaging * Tracking Code: BO * EXAM: CT CHEST, ABDOMEN, AND PELVIS WITH CONTRAST TECHNIQUE: Multidetector CT imaging of the chest, abdomen and pelvis was performed following the standard protocol during bolus administration of intravenous contrast. RADIATION DOSE REDUCTION: This exam was performed according to the departmental dose-optimization program which includes automated exposure control, adjustment of the mA and/or kV according to patient size and/or use of iterative reconstruction technique. CONTRAST:  OMNIPAQUE IOHEXOL 300 MG/ML  SOLN COMPARISON:  03/16/2023 FINDINGS: CT CHEST FINDINGS Cardiovascular: Right chest port catheter. Normal heart size. No pericardial effusion.  Mediastinum/Nodes: Slightly diminished size of a large prevascular/anterior mediastinal mass measuring 5.6 x 5.5 cm, previously 6.9 x 6.2 cm (series 2, image 24). Slightly diminished size of left hilar lymph nodes, index node measuring 1.4 x 0.9 cm, previously 1.8 x 1.5 cm (series 2, image 26). Thyroid gland, trachea, and esophagus demonstrate no significant findings. Lungs/Pleura: Diminished volume of a small, loculated appearing left pleural effusion with diminished, although still extensive associated pleural thickening and nodularity, as well as interval removal of a left-sided tunneled pleural drainage catheter. Although pleural nodularity is generally diminished, there is at least 1 new or enlarged nodule about the posterior left apex, measuring 1.0 x 0.7 cm (series 6, image 37). Mild underlying paraseptal emphysema. Musculoskeletal: No chest wall abnormality. No acute osseous findings. CT ABDOMEN PELVIS FINDINGS Hepatobiliary: No solid liver abnormality is seen. No gallstones, gallbladder wall thickening, or biliary dilatation. Pancreas: Unremarkable. No pancreatic ductal dilatation or surrounding inflammatory changes. Spleen: Normal in size without significant abnormality. Adrenals/Urinary Tract: Interval enlargement of a left adrenal nodule, measuring 1.8 x 1.2 cm, previously 1.2 x 0.9 cm (series 2, image 61). Under rotated right kidney. Kidneys are otherwise normal, without renal calculi, solid lesion, or hydronephrosis. Bladder is unremarkable. Stomach/Bowel: Stomach is within normal limits. Appendix appears normal. No evidence of bowel wall thickening, distention, or inflammatory changes. Vascular/Lymphatic: Aortic atherosclerosis. Slightly diminished size of previous index left retroperitoneal lymph nodes, measuring 1.1 x 0.9 cm, previously 1.3 x 1.3 cm (series 2, image 65). Other retroperitoneal nodes are slightly enlarged, aortocaval node measuring 1.7 x 1.1 cm, previously 1.3 x 0.8 cm (series 2,  image 71). Reproductive: No mass or other abnormality. Other: No abdominal wall hernia or abnormality. No ascites. Musculoskeletal: No acute osseous findings. New, or at least very substantially enlarged lytic metastasis of the L1 vertebral body (series 5, image 94). New small metastasis of the L2 vertebral body (series 5, image 91). IMPRESSION: 1. Slightly diminished size of a large prevascular/anterior mediastinal mass. 2. Diminished volume of a small, loculated appearing left pleural effusion with diminished, although still extensive associated pleural thickening and nodularity, as well as interval removal of a left-sided tunneled pleural drainage catheter. Although pleural nodularity is generally diminished, at least one new or enlarged nodule about the posterior left apex. 3. Interval enlargement of a left adrenal nodule. 4. Some retroperitoneal nodes diminished in size, other slightly enlarged. 5. New, or at least very substantially enlarged lytic metastasis of the L1 vertebral body. New small metastasis of the L2 vertebral body. 6. Findings are consistent with broadly mixed response to treatment, most significant for evidence of  new osseous metastatic disease. Aortic Atherosclerosis (ICD10-I70.0) and Emphysema (ICD10-J43.9). Electronically Signed   By: Jearld Lesch M.D.   On: 05/28/2023 11:20      IMPRESSION: {diagnosis}  ***  Today, I talked to the patient and family about the findings and work-up thus far.  We discussed the natural history of *** and general treatment, highlighting the role of radiotherapy in the management.  We discussed the available radiation techniques, and focused on the details of logistics and delivery.  We reviewed the anticipated acute and late sequelae associated with radiation in this setting.  The patient was encouraged to ask questions that I answered to the best of my ability. *** A patient consent form was discussed and signed.  We retained a copy for our records.   The patient would like to proceed with radiation and will be scheduled for CT simulation.  PLAN: ***    *** minutes of total time was spent for this patient encounter, including preparation, face-to-face counseling with the patient and coordination of care, physical exam, and documentation of the encounter.   ------------------------------------------------  Billie Lade, PhD, MD  This document serves as a record of services personally performed by Antony Blackbird, MD. It was created on his behalf by Herbie Saxon, a trained medical scribe. The creation of this record is based on the scribe's personal observations and the provider's statements to them. This document has been checked and approved by the attending provider.

## 2023-06-05 ENCOUNTER — Inpatient Hospital Stay: Payer: Medicaid Other

## 2023-06-05 VITALS — BP 117/89 | HR 115 | Temp 99.0°F

## 2023-06-05 DIAGNOSIS — Z5112 Encounter for antineoplastic immunotherapy: Secondary | ICD-10-CM | POA: Diagnosis not present

## 2023-06-05 DIAGNOSIS — J9859 Other diseases of mediastinum, not elsewhere classified: Secondary | ICD-10-CM | POA: Diagnosis not present

## 2023-06-05 DIAGNOSIS — B451 Cerebral cryptococcosis: Secondary | ICD-10-CM | POA: Diagnosis not present

## 2023-06-05 DIAGNOSIS — C3492 Malignant neoplasm of unspecified part of left bronchus or lung: Secondary | ICD-10-CM | POA: Diagnosis not present

## 2023-06-05 DIAGNOSIS — C7A1 Malignant poorly differentiated neuroendocrine tumors: Secondary | ICD-10-CM

## 2023-06-05 MED ORDER — PEGFILGRASTIM-JMDB 6 MG/0.6ML ~~LOC~~ SOSY
6.0000 mg | PREFILLED_SYRINGE | Freq: Once | SUBCUTANEOUS | Status: AC
  Administered 2023-06-05: 6 mg via SUBCUTANEOUS
  Filled 2023-06-05: qty 0.6

## 2023-06-05 NOTE — Patient Instructions (Signed)

## 2023-06-06 ENCOUNTER — Encounter: Payer: Self-pay | Admitting: Radiation Oncology

## 2023-06-06 ENCOUNTER — Ambulatory Visit
Admission: RE | Admit: 2023-06-06 | Discharge: 2023-06-06 | Disposition: A | Discharge status: Home / Self Care | Payer: Medicaid Other | Source: Ambulatory Visit | Attending: Radiation Oncology | Admitting: Radiation Oncology

## 2023-06-06 ENCOUNTER — Encounter: Payer: Self-pay | Admitting: *Deleted

## 2023-06-06 VITALS — BP 135/94 | HR 113 | Temp 97.5°F | Resp 18 | Ht 72.0 in | Wt 179.0 lb

## 2023-06-06 DIAGNOSIS — C7A1 Malignant poorly differentiated neuroendocrine tumors: Secondary | ICD-10-CM | POA: Diagnosis not present

## 2023-06-06 DIAGNOSIS — Z87891 Personal history of nicotine dependence: Secondary | ICD-10-CM | POA: Insufficient documentation

## 2023-06-06 DIAGNOSIS — Z923 Personal history of irradiation: Secondary | ICD-10-CM | POA: Diagnosis not present

## 2023-06-06 DIAGNOSIS — E46 Unspecified protein-calorie malnutrition: Secondary | ICD-10-CM | POA: Insufficient documentation

## 2023-06-06 DIAGNOSIS — J90 Pleural effusion, not elsewhere classified: Secondary | ICD-10-CM | POA: Insufficient documentation

## 2023-06-06 DIAGNOSIS — F1010 Alcohol abuse, uncomplicated: Secondary | ICD-10-CM | POA: Insufficient documentation

## 2023-06-06 DIAGNOSIS — C7B8 Other secondary neuroendocrine tumors: Secondary | ICD-10-CM | POA: Insufficient documentation

## 2023-06-06 DIAGNOSIS — B20 Human immunodeficiency virus [HIV] disease: Secondary | ICD-10-CM | POA: Diagnosis not present

## 2023-06-06 DIAGNOSIS — I700 Atherosclerosis of aorta: Secondary | ICD-10-CM | POA: Insufficient documentation

## 2023-06-06 DIAGNOSIS — C7951 Secondary malignant neoplasm of bone: Secondary | ICD-10-CM | POA: Diagnosis not present

## 2023-06-06 DIAGNOSIS — R222 Localized swelling, mass and lump, trunk: Secondary | ICD-10-CM | POA: Diagnosis not present

## 2023-06-06 DIAGNOSIS — Z79899 Other long term (current) drug therapy: Secondary | ICD-10-CM | POA: Diagnosis not present

## 2023-06-06 DIAGNOSIS — D696 Thrombocytopenia, unspecified: Secondary | ICD-10-CM | POA: Insufficient documentation

## 2023-06-06 DIAGNOSIS — K219 Gastro-esophageal reflux disease without esophagitis: Secondary | ICD-10-CM | POA: Insufficient documentation

## 2023-06-06 DIAGNOSIS — Z79624 Long term (current) use of inhibitors of nucleotide synthesis: Secondary | ICD-10-CM | POA: Insufficient documentation

## 2023-06-08 ENCOUNTER — Inpatient Hospital Stay: Payer: Medicaid Other

## 2023-06-08 ENCOUNTER — Encounter: Payer: Self-pay | Admitting: Hematology & Oncology

## 2023-06-08 NOTE — Progress Notes (Signed)
 [facility] CSW Progress Note  Clinical Child psychotherapist contacted patient by phone to assess needs per the request of Dr. Roselind Messier.  Patient understands he is to begin radiation therapy.  He continues living with hs sister.  CSW faxed referral to [employer] for a grant.  Informed him he can receive food from the food pantry at [facility].  He stated he understood.    Dorothey Baseman, LCSW Clinical Social Worker [facility]    Patient is participating in a Managed Medicaid Plan:  Yes

## 2023-06-11 ENCOUNTER — Ambulatory Visit
Admission: RE | Admit: 2023-06-11 | Discharge: 2023-06-11 | Disposition: A | Discharge status: Home / Self Care | Payer: Medicaid Other | Source: Ambulatory Visit | Attending: Radiation Oncology | Admitting: Radiation Oncology

## 2023-06-11 DIAGNOSIS — Z510 Encounter for antineoplastic radiation therapy: Secondary | ICD-10-CM | POA: Insufficient documentation

## 2023-06-11 DIAGNOSIS — J90 Pleural effusion, not elsewhere classified: Secondary | ICD-10-CM | POA: Diagnosis not present

## 2023-06-11 DIAGNOSIS — C7951 Secondary malignant neoplasm of bone: Secondary | ICD-10-CM | POA: Diagnosis not present

## 2023-06-11 DIAGNOSIS — Z21 Asymptomatic human immunodeficiency virus [HIV] infection status: Secondary | ICD-10-CM | POA: Insufficient documentation

## 2023-06-11 DIAGNOSIS — C7B09 Secondary carcinoid tumors of other sites: Secondary | ICD-10-CM | POA: Insufficient documentation

## 2023-06-11 DIAGNOSIS — C7A090 Malignant carcinoid tumor of the bronchus and lung: Secondary | ICD-10-CM | POA: Diagnosis present

## 2023-06-11 DIAGNOSIS — C7A1 Malignant poorly differentiated neuroendocrine tumors: Secondary | ICD-10-CM | POA: Insufficient documentation

## 2023-06-12 ENCOUNTER — Other Ambulatory Visit (HOSPITAL_BASED_OUTPATIENT_CLINIC_OR_DEPARTMENT_OTHER): Payer: Self-pay

## 2023-06-12 ENCOUNTER — Other Ambulatory Visit: Payer: Self-pay

## 2023-06-12 ENCOUNTER — Other Ambulatory Visit: Payer: Self-pay | Admitting: Hematology & Oncology

## 2023-06-12 MED ORDER — OXYCODONE HCL ER 15 MG PO T12A
15.0000 mg | EXTENDED_RELEASE_TABLET | Freq: Two times a day (BID) | ORAL | 0 refills | Status: DC
  Filled 2023-06-12 – 2023-06-15 (×2): qty 60, 30d supply, fill #0

## 2023-06-12 MED ORDER — OXYCODONE HCL 5 MG PO TABS
5.0000 mg | ORAL_TABLET | Freq: Four times a day (QID) | ORAL | 0 refills | Status: DC | PRN
  Filled 2023-06-12 – 2023-06-13 (×2): qty 60, 15d supply, fill #0

## 2023-06-13 ENCOUNTER — Other Ambulatory Visit (HOSPITAL_BASED_OUTPATIENT_CLINIC_OR_DEPARTMENT_OTHER): Payer: Self-pay

## 2023-06-14 ENCOUNTER — Other Ambulatory Visit (HOSPITAL_BASED_OUTPATIENT_CLINIC_OR_DEPARTMENT_OTHER): Payer: Self-pay

## 2023-06-14 ENCOUNTER — Encounter: Payer: Self-pay | Admitting: Hematology & Oncology

## 2023-06-14 DIAGNOSIS — C7A1 Malignant poorly differentiated neuroendocrine tumors: Secondary | ICD-10-CM | POA: Diagnosis not present

## 2023-06-14 DIAGNOSIS — C7951 Secondary malignant neoplasm of bone: Secondary | ICD-10-CM | POA: Diagnosis not present

## 2023-06-14 DIAGNOSIS — Z510 Encounter for antineoplastic radiation therapy: Secondary | ICD-10-CM | POA: Diagnosis not present

## 2023-06-15 ENCOUNTER — Other Ambulatory Visit (HOSPITAL_BASED_OUTPATIENT_CLINIC_OR_DEPARTMENT_OTHER): Payer: Self-pay

## 2023-06-15 ENCOUNTER — Other Ambulatory Visit: Payer: Self-pay

## 2023-06-18 ENCOUNTER — Other Ambulatory Visit: Payer: Self-pay

## 2023-06-18 ENCOUNTER — Ambulatory Visit
Admission: RE | Admit: 2023-06-18 | Discharge: 2023-06-18 | Disposition: A | Discharge status: Home / Self Care | Payer: Medicaid Other | Source: Ambulatory Visit | Attending: Radiation Oncology | Admitting: Radiation Oncology

## 2023-06-18 DIAGNOSIS — C7A1 Malignant poorly differentiated neuroendocrine tumors: Secondary | ICD-10-CM | POA: Diagnosis not present

## 2023-06-18 DIAGNOSIS — Z510 Encounter for antineoplastic radiation therapy: Secondary | ICD-10-CM | POA: Diagnosis not present

## 2023-06-18 DIAGNOSIS — C7951 Secondary malignant neoplasm of bone: Secondary | ICD-10-CM | POA: Diagnosis not present

## 2023-06-18 LAB — RAD ONC ARIA SESSION SUMMARY
Course Elapsed Days: 0
Plan Fractions Treated to Date: 1
Plan Fractions Treated to Date: 1
Plan Prescribed Dose Per Fraction: 2.5 Gy
Plan Prescribed Dose Per Fraction: 2.5 Gy
Plan Total Fractions Prescribed: 14
Plan Total Fractions Prescribed: 15
Plan Total Prescribed Dose: 35 Gy
Plan Total Prescribed Dose: 37.5 Gy
Reference Point Dosage Given to Date: 2.5 Gy
Reference Point Dosage Given to Date: 2.5 Gy
Reference Point Session Dosage Given: 2.5 Gy
Reference Point Session Dosage Given: 2.5 Gy
Session Number: 1

## 2023-06-19 ENCOUNTER — Ambulatory Visit
Admission: RE | Admit: 2023-06-19 | Discharge: 2023-06-19 | Disposition: A | Discharge status: Home / Self Care | Payer: Medicaid Other | Source: Ambulatory Visit | Attending: Radiation Oncology | Admitting: Radiation Oncology

## 2023-06-19 ENCOUNTER — Encounter: Payer: Self-pay | Admitting: Hematology & Oncology

## 2023-06-19 ENCOUNTER — Other Ambulatory Visit: Payer: Self-pay

## 2023-06-19 ENCOUNTER — Ambulatory Visit
Admission: RE | Admit: 2023-06-19 | Discharge: 2023-06-19 | Disposition: A | Discharge status: Home / Self Care | Source: Ambulatory Visit | Attending: Radiation Oncology | Admitting: Radiation Oncology

## 2023-06-19 ENCOUNTER — Other Ambulatory Visit: Payer: Self-pay | Admitting: Hematology & Oncology

## 2023-06-19 DIAGNOSIS — Z510 Encounter for antineoplastic radiation therapy: Secondary | ICD-10-CM | POA: Diagnosis not present

## 2023-06-19 DIAGNOSIS — C7A1 Malignant poorly differentiated neuroendocrine tumors: Secondary | ICD-10-CM

## 2023-06-19 DIAGNOSIS — C7951 Secondary malignant neoplasm of bone: Secondary | ICD-10-CM | POA: Diagnosis not present

## 2023-06-19 LAB — RAD ONC ARIA SESSION SUMMARY
Course Elapsed Days: 1
Plan Fractions Treated to Date: 2
Plan Fractions Treated to Date: 2
Plan Prescribed Dose Per Fraction: 2.5 Gy
Plan Prescribed Dose Per Fraction: 2.5 Gy
Plan Total Fractions Prescribed: 14
Plan Total Fractions Prescribed: 15
Plan Total Prescribed Dose: 35 Gy
Plan Total Prescribed Dose: 37.5 Gy
Reference Point Dosage Given to Date: 5 Gy
Reference Point Dosage Given to Date: 5 Gy
Reference Point Session Dosage Given: 2.5 Gy
Reference Point Session Dosage Given: 2.5 Gy
Session Number: 2

## 2023-06-19 MED ORDER — SONAFINE EX EMUL
1.0000 | Freq: Two times a day (BID) | CUTANEOUS | Status: DC
  Administered 2023-06-19: 1 via TOPICAL

## 2023-06-19 NOTE — Progress Notes (Signed)
 Pt here for patient teaching.    Pt given Radiation and You booklet, skin care instructions, and Sonafine.    Reviewed areas of pertinence such as diarrhea, fatigue, hair loss in treatment field, nausea and vomiting, skin changes, throat changes, urinary and bladder changes, cough, and shortness of breath .   Pt able to give teach back of have Imodium on hand,apply Sonafine bid and avoid applying anything to skin within 4 hours of treatment.   Pt verbalizes understanding of information given and will contact nursing with any questions or concerns.    [email or web address]

## 2023-06-19 NOTE — Progress Notes (Signed)
 Patient called per my request after requesting gas card and he does not have any remaining funds for [employer]. Advised him I would have social worker reach out to him regarding other transportation options. He verbalized understanding.  Staff message sent to [place] in social work.

## 2023-06-20 ENCOUNTER — Ambulatory Visit
Admission: RE | Admit: 2023-06-20 | Discharge: 2023-06-20 | Disposition: A | Discharge status: Home / Self Care | Payer: Medicaid Other | Source: Ambulatory Visit | Attending: Radiation Oncology | Admitting: Radiation Oncology

## 2023-06-20 ENCOUNTER — Encounter: Payer: Self-pay | Admitting: Hematology & Oncology

## 2023-06-20 ENCOUNTER — Other Ambulatory Visit: Payer: Self-pay

## 2023-06-20 DIAGNOSIS — Z510 Encounter for antineoplastic radiation therapy: Secondary | ICD-10-CM | POA: Diagnosis not present

## 2023-06-20 DIAGNOSIS — C7951 Secondary malignant neoplasm of bone: Secondary | ICD-10-CM | POA: Diagnosis not present

## 2023-06-20 DIAGNOSIS — C7A1 Malignant poorly differentiated neuroendocrine tumors: Secondary | ICD-10-CM | POA: Diagnosis not present

## 2023-06-20 LAB — RAD ONC ARIA SESSION SUMMARY
Course Elapsed Days: 2
Plan Fractions Treated to Date: 3
Plan Fractions Treated to Date: 3
Plan Prescribed Dose Per Fraction: 2.5 Gy
Plan Prescribed Dose Per Fraction: 2.5 Gy
Plan Total Fractions Prescribed: 14
Plan Total Fractions Prescribed: 15
Plan Total Prescribed Dose: 35 Gy
Plan Total Prescribed Dose: 37.5 Gy
Reference Point Dosage Given to Date: 7.5 Gy
Reference Point Dosage Given to Date: 7.5 Gy
Reference Point Session Dosage Given: 2.5 Gy
Reference Point Session Dosage Given: 2.5 Gy
Session Number: 3

## 2023-06-21 ENCOUNTER — Other Ambulatory Visit: Payer: Self-pay

## 2023-06-21 ENCOUNTER — Ambulatory Visit
Admission: RE | Admit: 2023-06-21 | Discharge: 2023-06-21 | Disposition: A | Discharge status: Home / Self Care | Payer: Medicaid Other | Source: Ambulatory Visit | Attending: Radiation Oncology | Admitting: Radiation Oncology

## 2023-06-21 DIAGNOSIS — C7A1 Malignant poorly differentiated neuroendocrine tumors: Secondary | ICD-10-CM | POA: Diagnosis not present

## 2023-06-21 DIAGNOSIS — Z510 Encounter for antineoplastic radiation therapy: Secondary | ICD-10-CM | POA: Diagnosis not present

## 2023-06-21 DIAGNOSIS — C7951 Secondary malignant neoplasm of bone: Secondary | ICD-10-CM | POA: Diagnosis not present

## 2023-06-21 LAB — RAD ONC ARIA SESSION SUMMARY
Course Elapsed Days: 3
Plan Fractions Treated to Date: 4
Plan Fractions Treated to Date: 4
Plan Prescribed Dose Per Fraction: 2.5 Gy
Plan Prescribed Dose Per Fraction: 2.5 Gy
Plan Total Fractions Prescribed: 14
Plan Total Fractions Prescribed: 15
Plan Total Prescribed Dose: 35 Gy
Plan Total Prescribed Dose: 37.5 Gy
Reference Point Dosage Given to Date: 10 Gy
Reference Point Dosage Given to Date: 10 Gy
Reference Point Session Dosage Given: 2.5 Gy
Reference Point Session Dosage Given: 2.5 Gy
Session Number: 4

## 2023-06-22 ENCOUNTER — Ambulatory Visit
Admission: RE | Admit: 2023-06-22 | Discharge: 2023-06-22 | Disposition: A | Discharge status: Home / Self Care | Payer: Medicaid Other | Source: Ambulatory Visit | Attending: Radiation Oncology | Admitting: Radiation Oncology

## 2023-06-22 ENCOUNTER — Other Ambulatory Visit: Payer: Self-pay

## 2023-06-22 DIAGNOSIS — C7A1 Malignant poorly differentiated neuroendocrine tumors: Secondary | ICD-10-CM | POA: Diagnosis not present

## 2023-06-22 DIAGNOSIS — C7951 Secondary malignant neoplasm of bone: Secondary | ICD-10-CM | POA: Diagnosis not present

## 2023-06-22 DIAGNOSIS — Z510 Encounter for antineoplastic radiation therapy: Secondary | ICD-10-CM | POA: Diagnosis not present

## 2023-06-22 LAB — RAD ONC ARIA SESSION SUMMARY
Course Elapsed Days: 4
Plan Fractions Treated to Date: 5
Plan Fractions Treated to Date: 5
Plan Prescribed Dose Per Fraction: 2.5 Gy
Plan Prescribed Dose Per Fraction: 2.5 Gy
Plan Total Fractions Prescribed: 14
Plan Total Fractions Prescribed: 15
Plan Total Prescribed Dose: 35 Gy
Plan Total Prescribed Dose: 37.5 Gy
Reference Point Dosage Given to Date: 12.5 Gy
Reference Point Dosage Given to Date: 12.5 Gy
Reference Point Session Dosage Given: 2.5 Gy
Reference Point Session Dosage Given: 2.5 Gy
Session Number: 5

## 2023-06-25 ENCOUNTER — Ambulatory Visit
Admission: RE | Admit: 2023-06-25 | Discharge: 2023-06-25 | Disposition: A | Discharge status: Home / Self Care | Payer: Medicaid Other | Source: Ambulatory Visit | Attending: Radiation Oncology | Admitting: Radiation Oncology

## 2023-06-25 ENCOUNTER — Other Ambulatory Visit: Payer: Self-pay

## 2023-06-25 DIAGNOSIS — Z510 Encounter for antineoplastic radiation therapy: Secondary | ICD-10-CM | POA: Diagnosis not present

## 2023-06-25 DIAGNOSIS — C7951 Secondary malignant neoplasm of bone: Secondary | ICD-10-CM | POA: Diagnosis not present

## 2023-06-25 DIAGNOSIS — C7A1 Malignant poorly differentiated neuroendocrine tumors: Secondary | ICD-10-CM | POA: Diagnosis not present

## 2023-06-25 LAB — RAD ONC ARIA SESSION SUMMARY
Course Elapsed Days: 7
Plan Fractions Treated to Date: 6
Plan Fractions Treated to Date: 6
Plan Prescribed Dose Per Fraction: 2.5 Gy
Plan Prescribed Dose Per Fraction: 2.5 Gy
Plan Total Fractions Prescribed: 14
Plan Total Fractions Prescribed: 15
Plan Total Prescribed Dose: 35 Gy
Plan Total Prescribed Dose: 37.5 Gy
Reference Point Dosage Given to Date: 15 Gy
Reference Point Dosage Given to Date: 15 Gy
Reference Point Session Dosage Given: 2.5 Gy
Reference Point Session Dosage Given: 2.5 Gy
Session Number: 6

## 2023-06-26 ENCOUNTER — Ambulatory Visit
Admission: RE | Admit: 2023-06-26 | Discharge: 2023-06-26 | Disposition: A | Discharge status: Home / Self Care | Payer: Medicaid Other | Source: Ambulatory Visit | Attending: Radiation Oncology | Admitting: Radiation Oncology

## 2023-06-26 ENCOUNTER — Ambulatory Visit
Admission: RE | Admit: 2023-06-26 | Discharge: 2023-06-26 | Disposition: A | Discharge status: Home / Self Care | Source: Ambulatory Visit | Attending: Radiation Oncology | Admitting: Radiation Oncology

## 2023-06-26 ENCOUNTER — Other Ambulatory Visit: Payer: Self-pay

## 2023-06-26 DIAGNOSIS — C7A1 Malignant poorly differentiated neuroendocrine tumors: Secondary | ICD-10-CM | POA: Diagnosis not present

## 2023-06-26 DIAGNOSIS — Z510 Encounter for antineoplastic radiation therapy: Secondary | ICD-10-CM | POA: Diagnosis not present

## 2023-06-26 DIAGNOSIS — C7951 Secondary malignant neoplasm of bone: Secondary | ICD-10-CM | POA: Diagnosis not present

## 2023-06-26 LAB — RAD ONC ARIA SESSION SUMMARY
Course Elapsed Days: 8
Plan Fractions Treated to Date: 7
Plan Fractions Treated to Date: 7
Plan Prescribed Dose Per Fraction: 2.5 Gy
Plan Prescribed Dose Per Fraction: 2.5 Gy
Plan Total Fractions Prescribed: 14
Plan Total Fractions Prescribed: 15
Plan Total Prescribed Dose: 35 Gy
Plan Total Prescribed Dose: 37.5 Gy
Reference Point Dosage Given to Date: 17.5 Gy
Reference Point Dosage Given to Date: 17.5 Gy
Reference Point Session Dosage Given: 2.5 Gy
Reference Point Session Dosage Given: 2.5 Gy
Session Number: 7

## 2023-06-27 ENCOUNTER — Inpatient Hospital Stay: Payer: Medicaid Other

## 2023-06-27 ENCOUNTER — Other Ambulatory Visit: Payer: Self-pay

## 2023-06-27 ENCOUNTER — Inpatient Hospital Stay (HOSPITAL_BASED_OUTPATIENT_CLINIC_OR_DEPARTMENT_OTHER): Payer: Medicaid Other | Admitting: Hematology & Oncology

## 2023-06-27 ENCOUNTER — Ambulatory Visit
Admission: RE | Admit: 2023-06-27 | Discharge: 2023-06-27 | Disposition: A | Discharge status: Home / Self Care | Source: Ambulatory Visit | Attending: Radiation Oncology | Admitting: Radiation Oncology

## 2023-06-27 ENCOUNTER — Other Ambulatory Visit: Payer: Self-pay | Admitting: Radiation Oncology

## 2023-06-27 ENCOUNTER — Encounter: Payer: Self-pay | Admitting: *Deleted

## 2023-06-27 ENCOUNTER — Encounter: Payer: Self-pay | Admitting: Hematology & Oncology

## 2023-06-27 ENCOUNTER — Ambulatory Visit
Admission: RE | Admit: 2023-06-27 | Discharge: 2023-06-27 | Disposition: A | Discharge status: Home / Self Care | Payer: Medicaid Other | Source: Ambulatory Visit | Attending: Radiation Oncology | Admitting: Radiation Oncology

## 2023-06-27 VITALS — BP 111/70 | HR 95 | Temp 98.2°F | Resp 18 | Ht 73.0 in | Wt 178.0 lb

## 2023-06-27 DIAGNOSIS — Z21 Asymptomatic human immunodeficiency virus [HIV] infection status: Secondary | ICD-10-CM | POA: Insufficient documentation

## 2023-06-27 DIAGNOSIS — C7951 Secondary malignant neoplasm of bone: Secondary | ICD-10-CM | POA: Diagnosis not present

## 2023-06-27 DIAGNOSIS — J90 Pleural effusion, not elsewhere classified: Secondary | ICD-10-CM | POA: Insufficient documentation

## 2023-06-27 DIAGNOSIS — C7A1 Malignant poorly differentiated neuroendocrine tumors: Secondary | ICD-10-CM | POA: Insufficient documentation

## 2023-06-27 DIAGNOSIS — Z510 Encounter for antineoplastic radiation therapy: Secondary | ICD-10-CM | POA: Diagnosis not present

## 2023-06-27 LAB — RAD ONC ARIA SESSION SUMMARY
Course Elapsed Days: 9
Plan Fractions Treated to Date: 8
Plan Fractions Treated to Date: 8
Plan Prescribed Dose Per Fraction: 2.5 Gy
Plan Prescribed Dose Per Fraction: 2.5 Gy
Plan Total Fractions Prescribed: 14
Plan Total Fractions Prescribed: 15
Plan Total Prescribed Dose: 35 Gy
Plan Total Prescribed Dose: 37.5 Gy
Reference Point Dosage Given to Date: 20 Gy
Reference Point Dosage Given to Date: 20 Gy
Reference Point Session Dosage Given: 2.5 Gy
Reference Point Session Dosage Given: 2.5 Gy
Session Number: 8

## 2023-06-27 LAB — CBC WITH DIFFERENTIAL (CANCER CENTER ONLY)
Abs Immature Granulocytes: 0.03 10*3/uL (ref 0.00–0.07)
Basophils Absolute: 0 10*3/uL (ref 0.0–0.1)
Basophils Relative: 1 %
Eosinophils Absolute: 0.1 10*3/uL (ref 0.0–0.5)
Eosinophils Relative: 2 %
HCT: 31.5 % — ABNORMAL LOW (ref 39.0–52.0)
Hemoglobin: 11.2 g/dL — ABNORMAL LOW (ref 13.0–17.0)
Immature Granulocytes: 1 %
Lymphocytes Relative: 29 %
Lymphs Abs: 1.2 10*3/uL (ref 0.7–4.0)
MCH: 34.9 pg — ABNORMAL HIGH (ref 26.0–34.0)
MCHC: 35.6 g/dL (ref 30.0–36.0)
MCV: 98.1 fL (ref 80.0–100.0)
Monocytes Absolute: 1 10*3/uL (ref 0.1–1.0)
Monocytes Relative: 24 %
Neutro Abs: 1.7 10*3/uL (ref 1.7–7.7)
Neutrophils Relative %: 43 %
Platelet Count: 246 10*3/uL (ref 150–400)
RBC: 3.21 MIL/uL — ABNORMAL LOW (ref 4.22–5.81)
RDW: 16.7 % — ABNORMAL HIGH (ref 11.5–15.5)
Smear Review: NORMAL
WBC Count: 3.9 10*3/uL — ABNORMAL LOW (ref 4.0–10.5)
WBC Morphology: ABNORMAL
nRBC: 0 % (ref 0.0–0.2)

## 2023-06-27 LAB — CMP (CANCER CENTER ONLY)
ALT: 11 U/L (ref 0–44)
AST: 14 U/L — ABNORMAL LOW (ref 15–41)
Albumin: 4 g/dL (ref 3.5–5.0)
Alkaline Phosphatase: 79 U/L (ref 38–126)
Anion gap: 7 (ref 5–15)
BUN: 11 mg/dL (ref 6–20)
CO2: 28 mmol/L (ref 22–32)
Calcium: 8.8 mg/dL — ABNORMAL LOW (ref 8.9–10.3)
Chloride: 105 mmol/L (ref 98–111)
Creatinine: 0.82 mg/dL (ref 0.61–1.24)
GFR, Estimated: >60 mL/min (ref >60)
Glucose, Bld: 93 mg/dL (ref 70–99)
Potassium: 3.9 mmol/L (ref 3.5–5.1)
Sodium: 140 mmol/L (ref 135–145)
Total Bilirubin: 0.4 mg/dL (ref 0.0–1.2)
Total Protein: 6.6 g/dL (ref 6.5–8.1)

## 2023-06-27 LAB — LACTATE DEHYDROGENASE: LDH: 271 U/L — ABNORMAL HIGH (ref 98–192)

## 2023-06-27 MED ORDER — SUCRALFATE 1 G PO TABS: 1.0000 g | ORAL_TABLET | Freq: Three times a day (TID) | ORAL | 0 refills | Status: DC

## 2023-06-27 NOTE — Progress Notes (Unsigned)
 Patient currently receiving radiation. Once radiotherapy complete he will start in new protocol.   Oncology Nurse Navigator Documentation     06/27/2023   12:30 PM  Oncology Nurse Navigator Flowsheets  Phase of Treatment Radiation  Radiation Actual Start Date: 06/18/2023  Radiation Expected End Date: 07/06/2023  Navigator Follow Up Date: 07/11/2023  Navigator Follow Up Reason: Follow-up Appointment;Chemotherapy  Navigator Location [facility]  Navigator Encounter Type Appt/Treatment Plan Review  Patient Visit Type MedOnc  Treatment Phase Active Tx  Barriers/Navigation Needs No Barriers At This Time  Interventions Coordination of Care  Acuity Level 1-No Barriers  Coordination of Care Appts  Time Spent with Patient 15

## 2023-06-27 NOTE — Progress Notes (Signed)
 Hematology and Oncology Follow Up Visit  Christopher Henson [id number] [birth year] 43 y.o. 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  He seems to be doing okay.  He has again radiotherapy to a left adrenal lesion.  The also was given some radiotherapy to the lung.  He is tolerating this okay.  He is eating okay.  He has had no dysphagia or odynophagia.  He has had no bleeding.  He has had no nausea or vomiting.  There has been no change in bowel or bladder habits.  He has had no rashes.  There has been no leg swelling.  His HIV has not been an issue for him.  He has had no headache.  We are going to have to make a change with his chemotherapy.  I think we need to get on something little bit different.  I think that Alimta/Avastin probably would not be a bad idea for him.  I think he could tolerate this.  Overall, I would have to say that his pain control seems to be doing pretty well.  He is using some marijuana to try to help with his appetite.  His weight is down a little bit more.  Overall, I would have to say that his performance status is probably ECOG 1.    Medications:  Current Outpatient Medications:    acetaminophen (TYLENOL) 325 MG tablet, Take 2 tablets (650 mg total) by mouth every 6 (six) hours as needed., Disp: , Rfl:    bictegravir-emtricitabine-tenofovir AF (BIKTARVY) 50-200-25 MG TABS tablet, Take 1 tablet by mouth daily., Disp: 30 tablet, Rfl: 0   oxyCODONE (OXY IR/ROXICODONE) 5 MG immediate release tablet, Take 1 tablet (5 mg total) by mouth every 6 (six) hours as needed., Disp: 60  tablet, Rfl: 0   oxyCODONE (OXYCONTIN) 15 mg 12 hr tablet, Take 1 tablet (15 mg total) by mouth every 12 (twelve) hours., Disp: 60 tablet, Rfl: 0   telmisartan (MICARDIS) 40 MG tablet, Take 1 tablet (40 mg total) by mouth daily., Disp: 30 tablet, Rfl: 6   valsartan (DIOVAN) 80 MG tablet, Take 1 tablet (80 mg total) by mouth daily., Disp: 30 tablet, Rfl: 1   dronabinol (MARINOL) 5 MG capsule, Take 1 capsule (5 mg total) by mouth 2 (two) times daily before a meal. (Patient not taking: Reported on 04/12/2023), Disp: 60 capsule, Rfl: 0   senna-docusate (SENOKOT-S) 8.6-50 MG tablet, Take 1 tablet by mouth 2 (two) times daily between meals as needed for mild constipation. (Patient not taking: Reported on 04/12/2023), Disp: , Rfl:   Allergies: No Known Allergies  Past Medical History, Surgical history, Social history, and Family History were reviewed and updated.  Review of Systems: Review of Systems  Constitutional: Negative.   HENT:  Negative.    Eyes: Negative.   Respiratory:  Positive for cough and shortness of breath.   Cardiovascular:  Positive for chest pain.  Gastrointestinal: Negative.   Endocrine: Negative.   Genitourinary: Negative.    Musculoskeletal: Negative.   Skin: Negative.   Neurological: Negative.   Hematological: Negative.   Psychiatric/Behavioral: Negative.  Physical Exam:  Temperature is 98.2.  Pulse 95.  Blood pressure 111/70.  Weight is 178 pounds.     Wt Readings from Last 3 Encounters:  06/27/23 178 lb (80.7 kg)  06/06/23 179 lb (81.2 kg)  05/31/23 175 lb (79.4 kg)    Physical Exam Vitals reviewed.  HENT:     Head: Normocephalic and atraumatic.  Eyes:     Pupils: Pupils are equal, round, and reactive to light.  Cardiovascular:     Rate and Rhythm: Normal rate and regular rhythm.     Heart sounds: Normal heart sounds.     Comments: Cardiac exam is tachycardic but regular.  There are no murmurs. Pulmonary:     Effort: Pulmonary effort is normal.      Breath sounds: Normal breath sounds.     Comments: His lungs sound relatively clear bilaterally.  Maybe a little bit decreased and breath sounds are on the left lower lung. Abdominal:     General: Bowel sounds are normal.     Palpations: Abdomen is soft.     Comments: Abdominal exam is soft.  He has good bowel sounds.  There is no fluid wave.  There is no palpable liver or spleen tip.  Musculoskeletal:        General: No tenderness or deformity. Normal range of motion.     Cervical back: Normal range of motion.  Lymphadenopathy:     Cervical: No cervical adenopathy.  Skin:    General: Skin is warm and dry.     Findings: No erythema or rash.  Neurological:     Mental Status: He is alert and oriented to person, place, and time.  Psychiatric:        Behavior: Behavior normal.        Thought Content: Thought content normal.        Judgment: Judgment normal.      Lab Results  Component Value Date   WBC 3.9 (L) 06/27/2023   HGB 11.2 (L) 06/27/2023   HCT 31.5 (L) 06/27/2023   MCV 98.1 06/27/2023   PLT 246 06/27/2023     Chemistry      Component Value Date/Time   NA 139 05/31/2023 0855   NA 143 02/25/2019 1041   K 3.9 05/31/2023 0855   CL 103 05/31/2023 0855   CO2 29 05/31/2023 0855   BUN 14 05/31/2023 0855   BUN 8 02/25/2019 1041   CREATININE 1.10 05/31/2023 0855   CREATININE 0.89 12/14/2022 1132      Component Value Date/Time   CALCIUM 9.8 05/31/2023 0855   ALKPHOS 84 05/31/2023 0855   AST 17 05/31/2023 0855   ALT 11 05/31/2023 0855   BILITOT 0.5 05/31/2023 0855       Impression and Plan: Christopher Henson is a very nice 43 year old African-American male.  He actually is one of the few people who was born on [birth year].  He has what certainly is acting like a large cell neuroendocrine carcinoma.    We will await him to finish radiotherapy.  He will finish up radiotherapy in about a week.  I will then plan for systemic therapy again.  I will try him on Alimta/Avastin.  I  think this would be reasonable.  Hopefully, we might be get a beneficial effect from his past radiotherapy.  If there is any pain issues, he can always let me know.  Thankfully, the HIV is not a problem.  It does not look like the pleural effusion has been  a problem on the left side.  I do not think there is been any accumulation.  We will plan to get him back when he starts the Alimta/Avastin.  I would try for 4 cycles of Alimta/Avastin and then repeat his CT scan.      Josph Macho, MD 3/19/20258:28 AM

## 2023-06-27 NOTE — Progress Notes (Signed)
 DISCONTINUE ON PATHWAY REGIMEN - Small Cell Lung     Cycles 1 through 4, every 21 days:     Atezolizumab      Carboplatin      Etoposide    Cycles 5 and beyond, every 21 days:     Atezolizumab   **Always confirm dose/schedule in your pharmacy ordering system**  PRIOR TREATMENT: [id number]: Atezolizumab 1,200 mg D1 + Carboplatin AUC=5 D1 + Etoposide 100 mg/m2 D1-3 q21 Days x 4 Cycles, Followed by Atezolizumab 1,200 mg q21 Days Until Progression or Unacceptable Toxicity  START OFF PATHWAY REGIMEN - Small Cell Lung   [id number]:Bevacizumab 15 mg/kg IV D1 + Pemetrexed 500 mg/m2 IV D1 q21 Days (Maintenance):   A cycle is every 21 days:     Bevacizumab-xxxx      Pemetrexed   **Always confirm dose/schedule in your pharmacy ordering system**  Patient Characteristics: Relapsed or Progressive Disease, Second Line, Relapse ? 6 Months Therapeutic Status: Relapsed or Progressive Disease Line of Therapy: Second Line Time to Relapse: Relapse ? 6 Months Intent of Therapy: Non-Curative / Palliative Intent, Discussed with Patient

## 2023-06-28 ENCOUNTER — Other Ambulatory Visit: Payer: Self-pay

## 2023-06-28 ENCOUNTER — Ambulatory Visit
Admission: RE | Admit: 2023-06-28 | Discharge: 2023-06-28 | Disposition: A | Discharge status: Home / Self Care | Payer: Medicaid Other | Source: Ambulatory Visit | Attending: Radiation Oncology | Admitting: Radiation Oncology

## 2023-06-28 ENCOUNTER — Encounter: Payer: Self-pay | Admitting: Hematology & Oncology

## 2023-06-28 DIAGNOSIS — C7951 Secondary malignant neoplasm of bone: Secondary | ICD-10-CM | POA: Diagnosis not present

## 2023-06-28 DIAGNOSIS — C7A1 Malignant poorly differentiated neuroendocrine tumors: Secondary | ICD-10-CM | POA: Diagnosis not present

## 2023-06-28 DIAGNOSIS — Z510 Encounter for antineoplastic radiation therapy: Secondary | ICD-10-CM | POA: Diagnosis not present

## 2023-06-28 LAB — RAD ONC ARIA SESSION SUMMARY
Course Elapsed Days: 10
Plan Fractions Treated to Date: 9
Plan Fractions Treated to Date: 9
Plan Prescribed Dose Per Fraction: 2.5 Gy
Plan Prescribed Dose Per Fraction: 2.5 Gy
Plan Total Fractions Prescribed: 14
Plan Total Fractions Prescribed: 15
Plan Total Prescribed Dose: 35 Gy
Plan Total Prescribed Dose: 37.5 Gy
Reference Point Dosage Given to Date: 22.5 Gy
Reference Point Dosage Given to Date: 22.5 Gy
Reference Point Session Dosage Given: 2.5 Gy
Reference Point Session Dosage Given: 2.5 Gy
Session Number: 9

## 2023-06-29 ENCOUNTER — Other Ambulatory Visit: Payer: Self-pay

## 2023-06-29 ENCOUNTER — Ambulatory Visit
Admission: RE | Admit: 2023-06-29 | Discharge: 2023-06-29 | Disposition: A | Discharge status: Home / Self Care | Payer: Medicaid Other | Source: Ambulatory Visit | Attending: Radiation Oncology | Admitting: Radiation Oncology

## 2023-06-29 ENCOUNTER — Encounter: Payer: Self-pay | Admitting: Hematology & Oncology

## 2023-06-29 DIAGNOSIS — C7A1 Malignant poorly differentiated neuroendocrine tumors: Secondary | ICD-10-CM | POA: Diagnosis not present

## 2023-06-29 DIAGNOSIS — C7951 Secondary malignant neoplasm of bone: Secondary | ICD-10-CM | POA: Diagnosis not present

## 2023-06-29 DIAGNOSIS — Z510 Encounter for antineoplastic radiation therapy: Secondary | ICD-10-CM | POA: Diagnosis not present

## 2023-06-29 LAB — RAD ONC ARIA SESSION SUMMARY
Course Elapsed Days: 11
Plan Fractions Treated to Date: 10
Plan Fractions Treated to Date: 10
Plan Prescribed Dose Per Fraction: 2.5 Gy
Plan Prescribed Dose Per Fraction: 2.5 Gy
Plan Total Fractions Prescribed: 14
Plan Total Fractions Prescribed: 15
Plan Total Prescribed Dose: 35 Gy
Plan Total Prescribed Dose: 37.5 Gy
Reference Point Dosage Given to Date: 25 Gy
Reference Point Dosage Given to Date: 25 Gy
Reference Point Session Dosage Given: 2.5 Gy
Reference Point Session Dosage Given: 2.5 Gy
Session Number: 10

## 2023-06-30 ENCOUNTER — Other Ambulatory Visit: Payer: Self-pay

## 2023-07-02 ENCOUNTER — Ambulatory Visit
Admission: RE | Admit: 2023-07-02 | Discharge: 2023-07-02 | Disposition: A | Discharge status: Home / Self Care | Payer: Medicaid Other | Source: Ambulatory Visit | Attending: Radiation Oncology | Admitting: Radiation Oncology

## 2023-07-02 ENCOUNTER — Other Ambulatory Visit: Payer: Self-pay

## 2023-07-02 DIAGNOSIS — C7951 Secondary malignant neoplasm of bone: Secondary | ICD-10-CM | POA: Diagnosis not present

## 2023-07-02 DIAGNOSIS — Z510 Encounter for antineoplastic radiation therapy: Secondary | ICD-10-CM | POA: Diagnosis not present

## 2023-07-02 DIAGNOSIS — C7A1 Malignant poorly differentiated neuroendocrine tumors: Secondary | ICD-10-CM | POA: Diagnosis not present

## 2023-07-02 LAB — RAD ONC ARIA SESSION SUMMARY
Course Elapsed Days: 14
Plan Fractions Treated to Date: 11
Plan Fractions Treated to Date: 11
Plan Prescribed Dose Per Fraction: 2.5 Gy
Plan Prescribed Dose Per Fraction: 2.5 Gy
Plan Total Fractions Prescribed: 14
Plan Total Fractions Prescribed: 15
Plan Total Prescribed Dose: 35 Gy
Plan Total Prescribed Dose: 37.5 Gy
Reference Point Dosage Given to Date: 27.5 Gy
Reference Point Dosage Given to Date: 27.5 Gy
Reference Point Session Dosage Given: 2.5 Gy
Reference Point Session Dosage Given: 2.5 Gy
Session Number: 11

## 2023-07-03 ENCOUNTER — Ambulatory Visit
Admission: RE | Admit: 2023-07-03 | Discharge: 2023-07-03 | Disposition: A | Discharge status: Home / Self Care | Source: Ambulatory Visit | Attending: Radiation Oncology | Admitting: Radiation Oncology

## 2023-07-03 ENCOUNTER — Other Ambulatory Visit: Payer: Self-pay

## 2023-07-03 ENCOUNTER — Ambulatory Visit
Admission: RE | Admit: 2023-07-03 | Discharge: 2023-07-03 | Disposition: A | Discharge status: Home / Self Care | Payer: Medicaid Other | Source: Ambulatory Visit | Attending: Radiation Oncology | Admitting: Radiation Oncology

## 2023-07-03 DIAGNOSIS — Z510 Encounter for antineoplastic radiation therapy: Secondary | ICD-10-CM | POA: Diagnosis not present

## 2023-07-03 DIAGNOSIS — J9859 Other diseases of mediastinum, not elsewhere classified: Secondary | ICD-10-CM | POA: Diagnosis not present

## 2023-07-03 DIAGNOSIS — C3492 Malignant neoplasm of unspecified part of left bronchus or lung: Secondary | ICD-10-CM | POA: Diagnosis not present

## 2023-07-03 DIAGNOSIS — C7951 Secondary malignant neoplasm of bone: Secondary | ICD-10-CM | POA: Diagnosis not present

## 2023-07-03 DIAGNOSIS — C7A1 Malignant poorly differentiated neuroendocrine tumors: Secondary | ICD-10-CM | POA: Diagnosis not present

## 2023-07-03 DIAGNOSIS — B451 Cerebral cryptococcosis: Secondary | ICD-10-CM | POA: Diagnosis not present

## 2023-07-03 LAB — RAD ONC ARIA SESSION SUMMARY
Course Elapsed Days: 15
Plan Fractions Treated to Date: 12
Plan Fractions Treated to Date: 12
Plan Prescribed Dose Per Fraction: 2.5 Gy
Plan Prescribed Dose Per Fraction: 2.5 Gy
Plan Total Fractions Prescribed: 14
Plan Total Fractions Prescribed: 15
Plan Total Prescribed Dose: 35 Gy
Plan Total Prescribed Dose: 37.5 Gy
Reference Point Dosage Given to Date: 30 Gy
Reference Point Dosage Given to Date: 30 Gy
Reference Point Session Dosage Given: 2.5 Gy
Reference Point Session Dosage Given: 2.5 Gy
Session Number: 12

## 2023-07-04 ENCOUNTER — Other Ambulatory Visit: Payer: Self-pay

## 2023-07-04 ENCOUNTER — Ambulatory Visit
Admission: RE | Admit: 2023-07-04 | Discharge: 2023-07-04 | Disposition: A | Discharge status: Home / Self Care | Payer: Medicaid Other | Source: Ambulatory Visit | Attending: Radiation Oncology | Admitting: Radiation Oncology

## 2023-07-04 DIAGNOSIS — C7951 Secondary malignant neoplasm of bone: Secondary | ICD-10-CM | POA: Diagnosis not present

## 2023-07-04 DIAGNOSIS — Z510 Encounter for antineoplastic radiation therapy: Secondary | ICD-10-CM | POA: Diagnosis not present

## 2023-07-04 DIAGNOSIS — C7A1 Malignant poorly differentiated neuroendocrine tumors: Secondary | ICD-10-CM | POA: Diagnosis not present

## 2023-07-04 LAB — RAD ONC ARIA SESSION SUMMARY
Course Elapsed Days: 16
Plan Fractions Treated to Date: 13
Plan Fractions Treated to Date: 13
Plan Prescribed Dose Per Fraction: 2.5 Gy
Plan Prescribed Dose Per Fraction: 2.5 Gy
Plan Total Fractions Prescribed: 14
Plan Total Fractions Prescribed: 15
Plan Total Prescribed Dose: 35 Gy
Plan Total Prescribed Dose: 37.5 Gy
Reference Point Dosage Given to Date: 32.5 Gy
Reference Point Dosage Given to Date: 32.5 Gy
Reference Point Session Dosage Given: 2.5 Gy
Reference Point Session Dosage Given: 2.5 Gy
Session Number: 13

## 2023-07-04 NOTE — Progress Notes (Signed)
 Pharmacist Chemotherapy Monitoring - Initial Assessment    Anticipated start date: 07/11/23   The following has been reviewed per standard work regarding the patient's treatment regimen: The patient's diagnosis, treatment plan and drug doses, and organ/hematologic function Lab orders and baseline tests specific to treatment regimen  The treatment plan start date, drug sequencing, and pre-medications Prior authorization status  Patient's documented medication list, including drug-drug interaction screen and prescriptions for anti-emetics and supportive care specific to the treatment regimen The drug concentrations, fluid compatibility, administration routes, and timing of the medications to be used The patient's access for treatment and lifetime cumulative dose history, if applicable  The patient's medication allergies and previous infusion related reactions, if applicable   Changes made to treatment plan:  N/A  Follow up needed:  N/A and follow up B12 given.   Candelaria Stagers, [facility], 07/04/2023  7:46 AM

## 2023-07-05 ENCOUNTER — Ambulatory Visit
Admission: RE | Admit: 2023-07-05 | Discharge: 2023-07-05 | Disposition: A | Discharge status: Home / Self Care | Payer: Medicaid Other | Source: Ambulatory Visit | Attending: Radiation Oncology | Admitting: Radiation Oncology

## 2023-07-05 ENCOUNTER — Other Ambulatory Visit: Payer: Self-pay

## 2023-07-05 DIAGNOSIS — C7A1 Malignant poorly differentiated neuroendocrine tumors: Secondary | ICD-10-CM | POA: Diagnosis not present

## 2023-07-05 DIAGNOSIS — C7951 Secondary malignant neoplasm of bone: Secondary | ICD-10-CM | POA: Diagnosis not present

## 2023-07-05 DIAGNOSIS — Z510 Encounter for antineoplastic radiation therapy: Secondary | ICD-10-CM | POA: Diagnosis not present

## 2023-07-05 LAB — RAD ONC ARIA SESSION SUMMARY
Course Elapsed Days: 17
Plan Fractions Treated to Date: 14
Plan Fractions Treated to Date: 14
Plan Prescribed Dose Per Fraction: 2.5 Gy
Plan Prescribed Dose Per Fraction: 2.5 Gy
Plan Total Fractions Prescribed: 14
Plan Total Fractions Prescribed: 15
Plan Total Prescribed Dose: 35 Gy
Plan Total Prescribed Dose: 37.5 Gy
Reference Point Dosage Given to Date: 35 Gy
Reference Point Dosage Given to Date: 35 Gy
Reference Point Session Dosage Given: 2.5 Gy
Reference Point Session Dosage Given: 2.5 Gy
Session Number: 14

## 2023-07-06 ENCOUNTER — Ambulatory Visit
Admission: RE | Admit: 2023-07-06 | Discharge: 2023-07-06 | Disposition: A | Discharge status: Home / Self Care | Payer: Medicaid Other | Source: Ambulatory Visit | Attending: Radiation Oncology | Admitting: Radiation Oncology

## 2023-07-06 ENCOUNTER — Other Ambulatory Visit: Payer: Self-pay

## 2023-07-06 DIAGNOSIS — C7951 Secondary malignant neoplasm of bone: Secondary | ICD-10-CM | POA: Diagnosis not present

## 2023-07-06 DIAGNOSIS — Z510 Encounter for antineoplastic radiation therapy: Secondary | ICD-10-CM | POA: Diagnosis not present

## 2023-07-06 DIAGNOSIS — C7A1 Malignant poorly differentiated neuroendocrine tumors: Secondary | ICD-10-CM | POA: Diagnosis not present

## 2023-07-06 LAB — RAD ONC ARIA SESSION SUMMARY
Course Elapsed Days: 18
Plan Fractions Treated to Date: 15
Plan Prescribed Dose Per Fraction: 2.5 Gy
Plan Total Fractions Prescribed: 15
Plan Total Prescribed Dose: 37.5 Gy
Reference Point Dosage Given to Date: 37.5 Gy
Reference Point Session Dosage Given: 2.5 Gy
Session Number: 15

## 2023-07-09 ENCOUNTER — Other Ambulatory Visit (HOSPITAL_BASED_OUTPATIENT_CLINIC_OR_DEPARTMENT_OTHER): Payer: Self-pay

## 2023-07-09 ENCOUNTER — Other Ambulatory Visit: Payer: Self-pay | Admitting: Hematology & Oncology

## 2023-07-09 MED ORDER — OXYCODONE HCL ER 15 MG PO T12A
15.0000 mg | EXTENDED_RELEASE_TABLET | Freq: Two times a day (BID) | ORAL | 0 refills | Status: DC
  Filled 2023-07-09 – 2023-07-16 (×3): qty 60, 30d supply, fill #0

## 2023-07-09 MED ORDER — OXYCODONE HCL 5 MG PO TABS
5.0000 mg | ORAL_TABLET | Freq: Four times a day (QID) | ORAL | 0 refills | Status: DC | PRN
  Filled 2023-07-09: qty 60, 15d supply, fill #0

## 2023-07-09 NOTE — Radiation Completion Notes (Signed)
  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  Intent: Curative     ==========DELIVERED PLANS==========  First Treatment Date: 2023-06-18 Last Treatment Date: 2023-07-06   Plan Name: Lung_L Site: Lung, Left Technique: 3D Mode: Photon Dose Per Fraction: 2.5 Gy Prescribed Dose (Delivered / Prescribed): 37.5 Gy / 37.5 Gy Prescribed Fxs (Delivered / Prescribed): 15 / 15   Plan Name: Abd Site: Abdomen Technique: 3D Mode: Photon Dose Per Fraction: 2.5 Gy Prescribed Dose (Delivered / Prescribed): 35 Gy / 35 Gy Prescribed Fxs (Delivered / Prescribed): 14 / 14   ====================================   The patient tolerated radiation. He experienced shortness of breath, cough, esophagitis and fatigue throughout his treatment.   The patient will return in one month and will continue follow up with Dr. [name] as well.      Joyice Faster, PA-C

## 2023-07-11 ENCOUNTER — Inpatient Hospital Stay: Attending: Hematology & Oncology

## 2023-07-11 ENCOUNTER — Inpatient Hospital Stay

## 2023-07-11 ENCOUNTER — Encounter: Payer: Self-pay | Admitting: Hematology & Oncology

## 2023-07-11 ENCOUNTER — Other Ambulatory Visit: Payer: Self-pay

## 2023-07-11 ENCOUNTER — Other Ambulatory Visit: Payer: Self-pay | Admitting: Hematology & Oncology

## 2023-07-11 ENCOUNTER — Other Ambulatory Visit (HOSPITAL_BASED_OUTPATIENT_CLINIC_OR_DEPARTMENT_OTHER): Payer: Self-pay

## 2023-07-11 ENCOUNTER — Inpatient Hospital Stay: Admitting: Licensed Clinical Social Worker

## 2023-07-11 ENCOUNTER — Encounter: Payer: Self-pay | Admitting: *Deleted

## 2023-07-11 ENCOUNTER — Inpatient Hospital Stay (HOSPITAL_BASED_OUTPATIENT_CLINIC_OR_DEPARTMENT_OTHER): Admitting: Hematology & Oncology

## 2023-07-11 VITALS — BP 118/80 | HR 100 | Temp 98.6°F | Resp 19 | Ht 73.0 in | Wt 172.0 lb

## 2023-07-11 DIAGNOSIS — Z923 Personal history of irradiation: Secondary | ICD-10-CM | POA: Insufficient documentation

## 2023-07-11 DIAGNOSIS — R197 Diarrhea, unspecified: Secondary | ICD-10-CM | POA: Insufficient documentation

## 2023-07-11 DIAGNOSIS — C7A1 Malignant poorly differentiated neuroendocrine tumors: Secondary | ICD-10-CM

## 2023-07-11 DIAGNOSIS — Z21 Asymptomatic human immunodeficiency virus [HIV] infection status: Secondary | ICD-10-CM | POA: Diagnosis not present

## 2023-07-11 DIAGNOSIS — Z5111 Encounter for antineoplastic chemotherapy: Secondary | ICD-10-CM | POA: Diagnosis present

## 2023-07-11 DIAGNOSIS — Z5112 Encounter for antineoplastic immunotherapy: Secondary | ICD-10-CM | POA: Diagnosis present

## 2023-07-11 LAB — CBC WITH DIFFERENTIAL (CANCER CENTER ONLY)
Abs Immature Granulocytes: 0.02 10*3/uL (ref 0.00–0.07)
Basophils Absolute: 0 10*3/uL (ref 0.0–0.1)
Basophils Relative: 1 %
Eosinophils Absolute: 1.8 10*3/uL — ABNORMAL HIGH (ref 0.0–0.5)
Eosinophils Relative: 40 %
HCT: 34 % — ABNORMAL LOW (ref 39.0–52.0)
Hemoglobin: 12.2 g/dL — ABNORMAL LOW (ref 13.0–17.0)
Immature Granulocytes: 0 %
Lymphocytes Relative: 9 %
Lymphs Abs: 0.4 10*3/uL — ABNORMAL LOW (ref 0.7–4.0)
MCH: 35.2 pg — ABNORMAL HIGH (ref 26.0–34.0)
MCHC: 35.9 g/dL (ref 30.0–36.0)
MCV: 98 fL (ref 80.0–100.0)
Monocytes Absolute: 0.7 10*3/uL (ref 0.1–1.0)
Monocytes Relative: 15 %
Neutro Abs: 1.6 10*3/uL — ABNORMAL LOW (ref 1.7–7.7)
Neutrophils Relative %: 35 %
Platelet Count: 177 10*3/uL (ref 150–400)
RBC: 3.47 MIL/uL — ABNORMAL LOW (ref 4.22–5.81)
RDW: 15.9 % — ABNORMAL HIGH (ref 11.5–15.5)
WBC Count: 4.6 10*3/uL (ref 4.0–10.5)
nRBC: 0 % (ref 0.0–0.2)

## 2023-07-11 LAB — CMP (CANCER CENTER ONLY)
ALT: 10 U/L (ref 0–44)
AST: 12 U/L — ABNORMAL LOW (ref 15–41)
Albumin: 4.2 g/dL (ref 3.5–5.0)
Alkaline Phosphatase: 69 U/L (ref 38–126)
Anion gap: 7 (ref 5–15)
BUN: 7 mg/dL (ref 6–20)
CO2: 28 mmol/L (ref 22–32)
Calcium: 9.1 mg/dL (ref 8.9–10.3)
Chloride: 105 mmol/L (ref 98–111)
Creatinine: 0.88 mg/dL (ref 0.61–1.24)
GFR, Estimated: >60 mL/min (ref >60)
Glucose, Bld: 92 mg/dL (ref 70–99)
Potassium: 3.5 mmol/L (ref 3.5–5.1)
Sodium: 140 mmol/L (ref 135–145)
Total Bilirubin: 0.5 mg/dL (ref 0.0–1.2)
Total Protein: 6.9 g/dL (ref 6.5–8.1)

## 2023-07-11 LAB — TOTAL PROTEIN, URINE DIPSTICK: Protein, ur: NEGATIVE mg/dL

## 2023-07-11 LAB — PREALBUMIN: Prealbumin: 29 mg/dL (ref 18–38)

## 2023-07-11 MED ORDER — DEXAMETHASONE 4 MG PO TABS: 4.0000 mg | ORAL_TABLET | Freq: Two times a day (BID) | ORAL | 5 refills | Status: DC

## 2023-07-11 MED ORDER — LIDOCAINE-PRILOCAINE 2.5-2.5 % EX CREA: TOPICAL_CREAM | CUTANEOUS | 3 refills | Status: DC

## 2023-07-11 MED ORDER — PROCHLORPERAZINE MALEATE 10 MG PO TABS: 10.0000 mg | ORAL_TABLET | Freq: Four times a day (QID) | ORAL | 1 refills | Status: DC | PRN

## 2023-07-11 MED ORDER — FOLIC ACID 1 MG PO TABS: 1.0000 mg | ORAL_TABLET | Freq: Every day | ORAL | 3 refills | Status: DC

## 2023-07-11 MED ORDER — BEVACIZUMAB-ADCD CHEMO INJECTION 400 MG/16ML
15.0000 mg/kg | Freq: Once | INTRAVENOUS | Status: AC
  Administered 2023-07-11: 1200 mg via INTRAVENOUS
  Filled 2023-07-11: qty 48

## 2023-07-11 MED ORDER — ZOLEDRONIC ACID 4 MG/100ML IV SOLN
4.0000 mg | Freq: Once | INTRAVENOUS | Status: AC
  Administered 2023-07-11: 4 mg via INTRAVENOUS
  Filled 2023-07-11: qty 100

## 2023-07-11 MED ORDER — SODIUM CHLORIDE 0.9 % IV SOLN: INTRAVENOUS | Status: DC

## 2023-07-11 MED ORDER — PROCHLORPERAZINE MALEATE 10 MG PO TABS
10.0000 mg | ORAL_TABLET | Freq: Once | ORAL | Status: AC
  Administered 2023-07-11: 10 mg via ORAL

## 2023-07-11 MED ORDER — SODIUM CHLORIDE 0.9% FLUSH
10.0000 mL | INTRAVENOUS | Status: DC | PRN
Start: 2023-07-11 — End: 2023-07-11
  Administered 2023-07-11: 10 mL

## 2023-07-11 MED ORDER — ONDANSETRON HCL 8 MG PO TABS: 8.0000 mg | ORAL_TABLET | Freq: Three times a day (TID) | ORAL | 1 refills | Status: DC | PRN

## 2023-07-11 MED ORDER — HEPARIN SOD (PORK) LOCK FLUSH 100 UNIT/ML IV SOLN
500.0000 [IU] | Freq: Once | INTRAVENOUS | Status: AC | PRN
  Administered 2023-07-11: 500 [IU]

## 2023-07-11 MED ORDER — CYANOCOBALAMIN 1000 MCG/ML IJ SOLN
1000.0000 ug | Freq: Once | INTRAMUSCULAR | Status: AC
  Administered 2023-07-11: 1000 ug via INTRAMUSCULAR
  Filled 2023-07-11: qty 1

## 2023-07-11 MED ORDER — SODIUM CHLORIDE 0.9 % IV SOLN
500.0000 mg/m2 | Freq: Once | INTRAVENOUS | Status: AC
  Administered 2023-07-11: 1000 mg via INTRAVENOUS
  Filled 2023-07-11: qty 40

## 2023-07-11 NOTE — Patient Instructions (Signed)

## 2023-07-11 NOTE — Progress Notes (Signed)
 Patient has completed radiation. Today he will begin a new regimen, Alimta/Avastin. Scans after cycle three.   Oncology Nurse Navigator Documentation     07/11/2023    8:30 AM  Oncology Nurse Navigator Flowsheets  Phase of Treatment Radiation  Radiation Actual End Date: 07/06/2023  Navigator Follow Up Date: 08/01/2023  Navigator Follow Up Reason: Follow-up Appointment;Chemotherapy  Navigator Location [facility]  Navigator Encounter Type Treatment  Patient Visit Type MedOnc  Treatment Phase Active Tx  Barriers/Navigation Needs No Barriers At This Time  Interventions Psycho-Social Support  Acuity Level 1-No Barriers  Time Spent with Patient 15

## 2023-07-11 NOTE — Progress Notes (Signed)
 [facility] CSW Progress Note  Visual merchandiser met with patient in infusion to assess needs.  Patient stated he filled out the Cancer Services application and is waiting to hear from them.  CSW provided a bag of food from the pantry.  He expressed no other needs at this time.    Dorothey Baseman, LCSW Clinical Social Worker [facility]    Patient is participating in a Managed Medicaid Plan:  Yes

## 2023-07-11 NOTE — Progress Notes (Signed)
 Hematology and Oncology Follow Up Visit  Christopher Henson [id number] [birth year] 43 y.o. 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  He completed his radiotherapy to the left adrenal gland.  He also had radiotherapy to his lung.  He tolerated this pretty well.  He does have little bit of odynophagia.  I would think this she will improve.  His weight is down a little bit.  He says he is eating okay.  He is smoking some marijuana to try to help with his appetite.  He has had no problems with fever.  He has had no problems urinating.  Has had little bit of diarrhea.  I suspect is by may be from the radiotherapy.  He has had no bleeding.  He has had no leg swelling.  Will go ahead and get him started on he seems to be doing okay.  He has again radiotherapy to a left adrenal lesion.  The also was given some radiotherapy to the lung.  He is tolerating this okay.  He is eating okay.  He has had no dysphagia or odynophagia.  He has had no bleeding.  He has had no nausea or vomiting.  There has been no change in bowel or bladder habits.  He has had no rashes.  There has been no leg swelling.  His HIV has not been an issue for him.  He has had no headache.  Overall, I would have to say that his performance status is probably ECOG 1.    Medications:  Current Outpatient Medications:    acetaminophen (TYLENOL) 325 MG tablet, Take 2 tablets (650 mg total) by mouth every 6 (six) hours as needed., Disp: , Rfl:    bictegravir-emtricitabine-tenofovir AF (BIKTARVY) 50-200-25 MG TABS tablet, Take 1 tablet by mouth  daily., Disp: 30 tablet, Rfl: 0   oxyCODONE (OXY IR/ROXICODONE) 5 MG immediate release tablet, Take 1 tablet (5 mg total) by mouth every 6 (six) hours as needed., Disp: 60 tablet, Rfl: 0   oxyCODONE (OXYCONTIN) 15 mg 12 hr tablet, Take 1 tablet (15 mg total) by mouth every 12 (twelve) hours., Disp: 60 tablet, Rfl: 0   telmisartan (MICARDIS) 40 MG tablet, Take 1 tablet (40 mg total) by mouth daily., Disp: 30 tablet, Rfl: 6   valsartan (DIOVAN) 80 MG tablet, Take 1 tablet (80 mg total) by mouth daily., Disp: 30 tablet, Rfl: 1   dronabinol (MARINOL) 5 MG capsule, Take 1 capsule (5 mg total) by mouth 2 (two) times daily before a meal. (Patient not taking: Reported on 04/12/2023), Disp: 60 capsule, Rfl: 0   senna-docusate (SENOKOT-S) 8.6-50 MG tablet, Take 1 tablet by mouth 2 (two) times daily between meals as needed for mild constipation. (Patient not taking: Reported on 04/12/2023), Disp: , Rfl:    sucralfate (CARAFATE) 1 g tablet, Take 1 tablet (1 g total) by mouth 4 (four) times daily -  with meals and at bedtime. Crush and dissolve in 10 mL's of warm water prior to swallowing, take 20 min prior to meals (Patient not taking: Reported on 07/11/2023), Disp: 120 tablet, Rfl: 0  Allergies:  No Known Allergies  Past Medical History, Surgical history, Social history, and Family History were reviewed and updated.  Review of Systems: Review of Systems  Constitutional: Negative.   HENT:  Negative.    Eyes: Negative.   Respiratory:  Positive for cough and shortness of breath.   Cardiovascular:  Positive for chest pain.  Gastrointestinal: Negative.   Endocrine: Negative.   Genitourinary: Negative.    Musculoskeletal: Negative.   Skin: Negative.   Neurological: Negative.   Hematological: Negative.   Psychiatric/Behavioral: Negative.      Physical Exam:  Temperature is 98.6.  Pulse 100.  Blood pressure 118/80.  Weight is 172 pounds.       Wt Readings from Last 3 Encounters:  07/11/23 172 lb 0.6 oz (78  kg)  06/27/23 178 lb (80.7 kg)  06/06/23 179 lb (81.2 kg)    Physical Exam Vitals reviewed.  HENT:     Head: Normocephalic and atraumatic.  Eyes:     Pupils: Pupils are equal, round, and reactive to light.  Cardiovascular:     Rate and Rhythm: Normal rate and regular rhythm.     Heart sounds: Normal heart sounds.     Comments: Cardiac exam is tachycardic but regular.  There are no murmurs. Pulmonary:     Effort: Pulmonary effort is normal.     Breath sounds: Normal breath sounds.     Comments: His lungs sound relatively clear bilaterally.  Maybe a little bit decreased and breath sounds are on the left lower lung. Abdominal:     General: Bowel sounds are normal.     Palpations: Abdomen is soft.     Comments: Abdominal exam is soft.  He has good bowel sounds.  There is no fluid wave.  There is no palpable liver or spleen tip.  Musculoskeletal:        General: No tenderness or deformity. Normal range of motion.     Cervical back: Normal range of motion.  Lymphadenopathy:     Cervical: No cervical adenopathy.  Skin:    General: Skin is warm and dry.     Findings: No erythema or rash.  Neurological:     Mental Status: He is alert and oriented to person, place, and time.  Psychiatric:        Behavior: Behavior normal.        Thought Content: Thought content normal.        Judgment: Judgment normal.      Lab Results  Component Value Date   WBC 3.9 (L) 06/27/2023   HGB 11.2 (L) 06/27/2023   HCT 31.5 (L) 06/27/2023   MCV 98.1 06/27/2023   PLT 246 06/27/2023     Chemistry      Component Value Date/Time   NA 140 06/27/2023 0812   NA 143 02/25/2019 1041   K 3.9 06/27/2023 0812   CL 105 06/27/2023 0812   CO2 28 06/27/2023 0812   BUN 11 06/27/2023 0812   BUN 8 02/25/2019 1041   CREATININE 0.82 06/27/2023 0812   CREATININE 0.89 12/14/2022 1132      Component Value Date/Time   CALCIUM 8.8 (L) 06/27/2023 0812   ALKPHOS 79 06/27/2023 0812   AST 14 (L) 06/27/2023 0812    ALT 11 06/27/2023 0812   BILITOT 0.4 06/27/2023 0812       Impression and Plan: Christopher Henson is a very nice 43 year old African-American male.  He actually is one of the few people who was born on [birth year].  He has what certainly is acting like a large cell neuroendocrine carcinoma.    We will now go ahead with systemic chemotherapy.  Hopefully, this will also provide a little bit more effect for the radiotherapy that he received.  I do not think there is been any pleural effusion that is recurred on the left side.  He says his breathing is doing pretty well.  We will go ahead with our systemic therapy with Alimta/Avastin.  I will then plan to probably do another scan on him in about 6 weeks.  I will add to have 3 cycles of treatment and then we will repeat her scan.  Again this is all about quality of life.  I want to make sure that his quality of life is as good as possible.     Josph Macho, MD 4/2/20258:19 AM

## 2023-07-11 NOTE — Patient Instructions (Signed)
 [facility]  Discharge Instructions: Thank you for choosing [facility] to provide your oncology and hematology care.   If you have a lab appointment with the Cancer Center, please go directly to the Cancer Center and check in at the registration area.  Wear comfortable clothing and clothing appropriate for easy access to any Portacath or PICC line.   We strive to give you quality time with your provider. You may need to reschedule your appointment if you arrive late (15 or more minutes).  Arriving late affects you and other patients whose appointments are after yours.  Also, if you miss three or more appointments without notifying the office, you may be dismissed from the clinic at the provider's discretion.      For prescription refill requests, have your pharmacy contact our office and allow 72 hours for refills to be completed.    Today you received the following chemotherapy and/or immunotherapy agents alimta,avastin     To help prevent nausea and vomiting after your treatment, we encourage you to take your nausea medication as directed.  BELOW ARE SYMPTOMS THAT SHOULD BE REPORTED IMMEDIATELY: *FEVER GREATER THAN 100.4 F (38 C) OR HIGHER *CHILLS OR SWEATING *NAUSEA AND VOMITING THAT IS NOT CONTROLLED WITH YOUR NAUSEA MEDICATION *UNUSUAL SHORTNESS OF BREATH *UNUSUAL BRUISING OR BLEEDING *URINARY PROBLEMS (pain or burning when urinating, or frequent urination) *BOWEL PROBLEMS (unusual diarrhea, constipation, pain near the anus) TENDERNESS IN MOUTH AND THROAT WITH OR WITHOUT PRESENCE OF ULCERS (sore throat, sores in mouth, or a toothache) UNUSUAL RASH, SWELLING OR PAIN  UNUSUAL VAGINAL DISCHARGE OR ITCHING   Items with * indicate a potential emergency and should be followed up as soon as possible or go to the Emergency Department if any problems should occur.  Please show the CHEMOTHERAPY ALERT CARD or  IMMUNOTHERAPY ALERT CARD at check-in to the Emergency Department and triage nurse. Should you have questions after your visit or need to cancel or reschedule your appointment, please contact [facility] - A DEPT OF [facility]  [phone] and follow the prompts.  Office hours are 8:00 a.m. to 4:30 p.m. Monday - Friday. Please note that voicemails left after 4:00 p.m. may not be returned until the following business day.  We are closed weekends and major holidays. You have access to a nurse at all times for urgent questions. Please call the main number to the clinic [phone] and follow the prompts.  For any non-urgent questions, you may also contact your provider using MyChart. We now offer e-Visits for anyone 48 and older to request care online for non-urgent symptoms. For details visit [email or web address].   Also download the MyChart app! Go to the app store, search "MyChart", open the app, select [place], and log in with your MyChart username and password.

## 2023-07-11 NOTE — Progress Notes (Signed)
 Okay for bevacizumab today with baseline UP 100 per Dr. [name].

## 2023-07-16 ENCOUNTER — Other Ambulatory Visit (HOSPITAL_BASED_OUTPATIENT_CLINIC_OR_DEPARTMENT_OTHER): Payer: Self-pay

## 2023-07-17 ENCOUNTER — Other Ambulatory Visit (HOSPITAL_BASED_OUTPATIENT_CLINIC_OR_DEPARTMENT_OTHER): Payer: Self-pay

## 2023-07-17 ENCOUNTER — Telehealth: Payer: Self-pay | Admitting: *Deleted

## 2023-07-17 ENCOUNTER — Other Ambulatory Visit: Payer: Self-pay

## 2023-07-17 NOTE — Telephone Encounter (Signed)
 Medication Prior Authorization Status  Processed CoverMyMeds KEY: [id number]  Approved Today  Per [employer] Medicaid Case ID: [id number]   Effective 07/16/2023 through 10/15/2023.

## 2023-07-31 ENCOUNTER — Other Ambulatory Visit: Payer: Self-pay | Admitting: Hematology & Oncology

## 2023-07-31 ENCOUNTER — Other Ambulatory Visit (HOSPITAL_BASED_OUTPATIENT_CLINIC_OR_DEPARTMENT_OTHER): Payer: Self-pay

## 2023-07-31 MED ORDER — OXYCODONE HCL 5 MG PO TABS
5.0000 mg | ORAL_TABLET | Freq: Four times a day (QID) | ORAL | 0 refills | Status: DC | PRN
  Filled 2023-07-31: qty 60, 15d supply, fill #0

## 2023-08-01 ENCOUNTER — Encounter: Payer: Self-pay | Admitting: *Deleted

## 2023-08-01 ENCOUNTER — Inpatient Hospital Stay

## 2023-08-01 ENCOUNTER — Encounter: Payer: Self-pay | Admitting: Hematology & Oncology

## 2023-08-01 ENCOUNTER — Inpatient Hospital Stay (HOSPITAL_BASED_OUTPATIENT_CLINIC_OR_DEPARTMENT_OTHER): Admitting: Hematology & Oncology

## 2023-08-01 ENCOUNTER — Encounter: Payer: Self-pay | Admitting: Radiation Oncology

## 2023-08-01 VITALS — BP 125/88 | HR 80 | Resp 18

## 2023-08-01 VITALS — BP 127/89 | HR 98 | Temp 98.4°F | Resp 20 | Ht 73.0 in | Wt 171.0 lb

## 2023-08-01 DIAGNOSIS — C7A1 Malignant poorly differentiated neuroendocrine tumors: Secondary | ICD-10-CM

## 2023-08-01 DIAGNOSIS — Z5112 Encounter for antineoplastic immunotherapy: Secondary | ICD-10-CM | POA: Diagnosis not present

## 2023-08-01 LAB — CMP (CANCER CENTER ONLY)
ALT: 22 U/L (ref 0–44)
AST: 21 U/L (ref 15–41)
Albumin: 4 g/dL (ref 3.5–5.0)
Alkaline Phosphatase: 71 U/L (ref 38–126)
Anion gap: 8 (ref 5–15)
BUN: 8 mg/dL (ref 6–20)
CO2: 27 mmol/L (ref 22–32)
Calcium: 8.8 mg/dL — ABNORMAL LOW (ref 8.9–10.3)
Chloride: 107 mmol/L (ref 98–111)
Creatinine: 0.98 mg/dL (ref 0.61–1.24)
GFR, Estimated: >60 mL/min (ref >60)
Glucose, Bld: 114 mg/dL — ABNORMAL HIGH (ref 70–99)
Potassium: 3.5 mmol/L (ref 3.5–5.1)
Sodium: 142 mmol/L (ref 135–145)
Total Bilirubin: 0.4 mg/dL (ref 0.0–1.2)
Total Protein: 6.5 g/dL (ref 6.5–8.1)

## 2023-08-01 LAB — CBC WITH DIFFERENTIAL (CANCER CENTER ONLY)
Abs Immature Granulocytes: 0.01 10*3/uL (ref 0.00–0.07)
Basophils Absolute: 0 10*3/uL (ref 0.0–0.1)
Basophils Relative: 1 %
Eosinophils Absolute: 0.7 10*3/uL — ABNORMAL HIGH (ref 0.0–0.5)
Eosinophils Relative: 21 %
HCT: 32 % — ABNORMAL LOW (ref 39.0–52.0)
Hemoglobin: 11.4 g/dL — ABNORMAL LOW (ref 13.0–17.0)
Immature Granulocytes: 0 %
Lymphocytes Relative: 19 %
Lymphs Abs: 0.6 10*3/uL — ABNORMAL LOW (ref 0.7–4.0)
MCH: 35.2 pg — ABNORMAL HIGH (ref 26.0–34.0)
MCHC: 35.6 g/dL (ref 30.0–36.0)
MCV: 98.8 fL (ref 80.0–100.0)
Monocytes Absolute: 0.5 10*3/uL (ref 0.1–1.0)
Monocytes Relative: 14 %
Neutro Abs: 1.5 10*3/uL — ABNORMAL LOW (ref 1.7–7.7)
Neutrophils Relative %: 45 %
Platelet Count: 72 10*3/uL — ABNORMAL LOW (ref 150–400)
RBC: 3.24 MIL/uL — ABNORMAL LOW (ref 4.22–5.81)
RDW: 16 % — ABNORMAL HIGH (ref 11.5–15.5)
WBC Count: 3.2 10*3/uL — ABNORMAL LOW (ref 4.0–10.5)
nRBC: 0 % (ref 0.0–0.2)

## 2023-08-01 LAB — PREALBUMIN: Prealbumin: 29 mg/dL (ref 18–38)

## 2023-08-01 MED ORDER — PROCHLORPERAZINE MALEATE 10 MG PO TABS
10.0000 mg | ORAL_TABLET | Freq: Once | ORAL | Status: AC
Start: 2023-08-01 — End: 2023-08-01
  Administered 2023-08-01: 10 mg via ORAL
  Filled 2023-08-01: qty 1

## 2023-08-01 MED ORDER — SODIUM CHLORIDE 0.9% FLUSH
10.0000 mL | INTRAVENOUS | Status: DC | PRN
  Administered 2023-08-01: 10 mL

## 2023-08-01 MED ORDER — SODIUM CHLORIDE 0.9 % IV SOLN
15.0000 mg/kg | Freq: Once | INTRAVENOUS | Status: AC
  Administered 2023-08-01: 1200 mg via INTRAVENOUS
  Filled 2023-08-01: qty 48

## 2023-08-01 MED ORDER — SODIUM CHLORIDE 0.9 % IV SOLN: INTRAVENOUS | Status: DC

## 2023-08-01 MED ORDER — HEPARIN SOD (PORK) LOCK FLUSH 100 UNIT/ML IV SOLN
500.0000 [IU] | Freq: Once | INTRAVENOUS | Status: AC | PRN
  Administered 2023-08-01: 500 [IU]

## 2023-08-01 MED ORDER — SODIUM CHLORIDE 0.9 % IV SOLN
500.0000 mg/m2 | Freq: Once | INTRAVENOUS | Status: AC
  Administered 2023-08-01: 1000 mg via INTRAVENOUS
  Filled 2023-08-01: qty 40

## 2023-08-01 NOTE — Patient Instructions (Signed)

## 2023-08-01 NOTE — Progress Notes (Signed)
 Hematology and Oncology Follow Up Visit  Christopher Henson [id number] May 29, 1980 43 y.o. 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 is in for his follow-up.  He is doing pretty well.  He really has had no specific complaints.  He has had no problems with nausea or vomiting.  He has had no issues with fever.  He has had no mouth sores.  He did have a very nice Easter weekend.  He ate quite a bit he said.  He has had no problems with bowels or bladder.  Says has has had some diarrhea.  He takes Pepto-Bismol for this.  Thankfully, there is been no problems with respect to his HIV.  He continues on Biktarvy .  He has had no problems with rashes.  He has had no leg swelling.  He has had no cough.  He has had no headache.  Overall, I would say that his performance status about ECOG 1.  .    Medications:  Current Outpatient Medications:    acetaminophen  (TYLENOL ) 325 MG tablet, Take 2 tablets (650 mg total) by mouth every 6 (six) hours as needed., Disp: , Rfl:    bictegravir-emtricitabine -tenofovir  AF (BIKTARVY ) 50-200-25 MG TABS tablet, Take 1 tablet by mouth daily., Disp: 30 tablet, Rfl: 0   lidocaine -prilocaine  (EMLA ) cream, Apply to affected area once, Disp: 30 g, Rfl: 3   oxyCODONE  (OXY IR/ROXICODONE ) 5 MG immediate release tablet, Take 1 tablet (5 mg total) by mouth every 6 (six) hours as needed., Disp: 60 tablet, Rfl: 0   oxyCODONE  (OXYCONTIN ) 15 mg 12 hr tablet, Take 1 tablet (15 mg total) by mouth every 12 (twelve) hours., Disp: 60 tablet, Rfl: 0   telmisartan  (MICARDIS ) 40 MG tablet, Take 1 tablet (40 mg  total) by mouth daily., Disp: 30 tablet, Rfl: 6   dexamethasone  (DECADRON ) 4 MG tablet, Take 1 tablet (4 mg total) by mouth 2 (two) times daily. Start day before each pemetrexed  dose and continue for 3 days.Take with food. (Patient not taking: Reported on 08/01/2023), Disp: 6 tablet, Rfl: 5   dronabinol  (MARINOL ) 5 MG capsule, Take 1 capsule (5 mg total) by mouth 2 (two) times daily before a meal. (Patient not taking: Reported on 04/12/2023), Disp: 60 capsule, Rfl: 0   folic acid  (FOLVITE ) 1 MG tablet, Take 1 tablet (1 mg total) by mouth daily. Start 7 days before pemetrexed  chemotherapy. Continue until 21 days after pemetrexed  completed. (Patient not taking: Reported on 08/01/2023), Disp: 100 tablet, Rfl: 3   ondansetron  (ZOFRAN ) 8 MG tablet, Take 1 tablet (8 mg total) by mouth every 8 (eight) hours as needed for nausea or vomiting. (Patient not taking: Reported on 08/01/2023), Disp: 30 tablet, Rfl: 1   prochlorperazine  (COMPAZINE ) 10 MG tablet, Take 1 tablet (10 mg total) by mouth every 6 (six) hours as needed for nausea or vomiting. (Patient not taking: Reported on 08/01/2023), Disp: 30 tablet, Rfl: 1   senna-docusate (SENOKOT-S) 8.6-50 MG tablet, Take 1 tablet by mouth 2 (two) times daily between meals as needed for mild constipation. (Patient not taking: Reported on 04/12/2023), Disp: , Rfl:  sucralfate  (CARAFATE ) 1 g tablet, Take 1 tablet (1 g total) by mouth 4 (four) times daily -  with meals and at bedtime. Crush and dissolve in 10 mL's of warm water prior to swallowing, take 20 min prior to meals (Patient not taking: Reported on 08/01/2023), Disp: 120 tablet, Rfl: 0  Allergies: No Known Allergies  Past Medical History, Surgical history, Social history, and Family History were reviewed and updated.  Review of Systems: Review of Systems  Constitutional: Negative.   HENT:  Negative.    Eyes: Negative.   Respiratory:  Positive for cough and shortness of breath.   Cardiovascular:  Positive for chest  pain.  Gastrointestinal: Negative.   Endocrine: Negative.   Genitourinary: Negative.    Musculoskeletal: Negative.   Skin: Negative.   Neurological: Negative.   Hematological: Negative.   Psychiatric/Behavioral: Negative.      Physical Exam:  Temperature is 98.4.  Pulse 98.  Blood pressure 127/89.  Weight is 171 pounds.      Wt Readings from Last 3 Encounters:  08/01/23 171 lb (77.6 kg)  07/11/23 172 lb 0.6 oz (78 kg)  06/27/23 178 lb (80.7 kg)    Physical Exam Vitals reviewed.  HENT:     Head: Normocephalic and atraumatic.  Eyes:     Pupils: Pupils are equal, round, and reactive to light.  Cardiovascular:     Rate and Rhythm: Normal rate and regular rhythm.     Heart sounds: Normal heart sounds.     Comments: Cardiac exam is tachycardic but regular.  There are no murmurs. Pulmonary:     Effort: Pulmonary effort is normal.     Breath sounds: Normal breath sounds.     Comments: His lungs sound relatively clear bilaterally.  Maybe a little bit decreased and breath sounds are on the left lower lung. Abdominal:     General: Bowel sounds are normal.     Palpations: Abdomen is soft.     Comments: Abdominal exam is soft.  He has good bowel sounds.  There is no fluid wave.  There is no palpable liver or spleen tip.  Musculoskeletal:        General: No tenderness or deformity. Normal range of motion.     Cervical back: Normal range of motion.  Lymphadenopathy:     Cervical: No cervical adenopathy.  Skin:    General: Skin is warm and dry.     Findings: No erythema or rash.  Neurological:     Mental Status: He is alert and oriented to person, place, and time.  Psychiatric:        Behavior: Behavior normal.        Thought Content: Thought content normal.        Judgment: Judgment normal.      Lab Results  Component Value Date   WBC 4.6 07/11/2023   HGB 12.2 (L) 07/11/2023   HCT 34.0 (L) 07/11/2023   MCV 98.0 07/11/2023   PLT 177 07/11/2023     Chemistry       Component Value Date/Time   NA 140 07/11/2023 0805   NA 143 02/25/2019 1041   K 3.5 07/11/2023 0805   CL 105 07/11/2023 0805   CO2 28 07/11/2023 0805   BUN 7 07/11/2023 0805   BUN 8 02/25/2019 1041   CREATININE 0.88 07/11/2023 0805   CREATININE 0.89 12/14/2022 1132      Component Value Date/Time   CALCIUM  9.1 07/11/2023 0805   ALKPHOS 69 07/11/2023 0805  AST 12 (L) 07/11/2023 0805   ALT 10 07/11/2023 0805   BILITOT 0.5 07/11/2023 0805       Impression and Plan: Mr. Hayner is a very nice 43 year old African-American male.  He actually is one of the few people who was born on [birth year].  He has what certainly is acting like a large cell neuroendocrine carcinoma.    We will have to set him up with another scan.  His last scan was done back in February.  He has had radiotherapy for the adrenal and for the his lung.  Hopefully this has helped a little bit.  I think the scan will certainly tell us  how things are going for him.  His weight is holding steady.  Maybe, this is a encouraging sign.  We will plan to get the scan in about 2 weeks.  Will then get him back in 3 weeks for hopefully continued treatment.    Ivor Mars, MD 4/23/20258:12 AM

## 2023-08-01 NOTE — Patient Instructions (Signed)
 [facility] - A DEPT OF [facility]  Discharge Instructions: Thank you for choosing [name] to provide your oncology and hematology care.   If you have a lab appointment with the Cancer Center, please go directly to the Cancer Center and check in at the registration area.  Wear comfortable clothing and clothing appropriate for easy access to any Portacath or PICC line.   We strive to give you quality time with your provider. You may need to reschedule your appointment if you arrive late (15 or more minutes).  Arriving late affects you and other patients whose appointments are after yours.  Also, if you miss three or more appointments without notifying the office, you may be dismissed from the clinic at the provider's discretion.      For prescription refill requests, have your pharmacy contact our office and allow 72 hours for refills to be completed.    Today you received the following chemotherapy and/or immunotherapy agents Avastin /Alimta      To help prevent nausea and vomiting after your treatment, we encourage you to take your nausea medication as directed.  BELOW ARE SYMPTOMS THAT SHOULD BE REPORTED IMMEDIATELY: *FEVER GREATER THAN 100.4 F (38 C) OR HIGHER *CHILLS OR SWEATING *NAUSEA AND VOMITING THAT IS NOT CONTROLLED WITH YOUR NAUSEA MEDICATION *UNUSUAL SHORTNESS OF BREATH *UNUSUAL BRUISING OR BLEEDING *URINARY PROBLEMS (pain or burning when urinating, or frequent urination) *BOWEL PROBLEMS (unusual diarrhea, constipation, pain near the anus) TENDERNESS IN MOUTH AND THROAT WITH OR WITHOUT PRESENCE OF ULCERS (sore throat, sores in mouth, or a toothache) UNUSUAL RASH, SWELLING OR PAIN  UNUSUAL VAGINAL DISCHARGE OR ITCHING   Items with * indicate a potential emergency and should be followed up as soon as possible or go to the Emergency Department if any problems should occur.  Please show the CHEMOTHERAPY ALERT CARD or  IMMUNOTHERAPY ALERT CARD at check-in to the Emergency Department and triage nurse. Should you have questions after your visit or need to cancel or reschedule your appointment, please contact [facility] - A DEPT OF [facility]  [phone] and follow the prompts.  Office hours are 8:00 a.m. to 4:30 p.m. Monday - Friday. Please note that voicemails left after 4:00 p.m. may not be returned until the following business day.  We are closed weekends and major holidays. You have access to a nurse at all times for urgent questions. Please call the main number to the clinic [phone] and follow the prompts.  For any non-urgent questions, you may also contact your provider using MyChart. We now offer e-Visits for anyone 86 and older to request care online for non-urgent symptoms. For details visit [email or web address].   Also download the MyChart app! Go to the app store, search "MyChart", open the app, select [place], and log in with your MyChart username and password.

## 2023-08-01 NOTE — Progress Notes (Signed)
 Radiation Oncology         [phone] ________________________________  Name: Christopher Henson MRN: [id number]  Date: 08/02/2023  DOB: Jun 15, 1980  Follow-Up Visit Note  CC: Christopher Crystal, PA-C  Henson, Christopher Cowing, PA-C  No diagnosis found.  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   Interval Since Last Radiation:  27 days  Intent: Curative  First Treatment Date: 2023-06-18 Last Treatment Date: 2023-07-06   Plan Name: Lung_L Site: Lung, Left Technique: 3D Mode: Photon Dose Per Fraction: 2.5 Gy Prescribed Dose (Delivered / Prescribed): 37.5 Gy / 37.5 Gy Prescribed Fxs (Delivered / Prescribed): 15 / 15   Plan Name: Abd Site: Abdomen Technique: 3D Mode: Photon Dose Per Fraction: 2.5 Gy Prescribed Dose (Delivered / Prescribed): 35 Gy / 35 Gy Prescribed Fxs (Delivered / Prescribed): 14 / 14   Narrative:  The patient returns today for routine follow-up. He was last seen in office on 06/06/23 for his initial consult.   Since then, patient completed his radiation treatment which he  tolerated quite well. Patient did however endorse experiencing  shortness of breath, cough, esophagitis and fatigue throughout his treatment.                               Patient presented for a follow up with Dr. Maria Shiner on 07/11/23 during which he reported doing well overall and deciding to proceed with systemic therapy with Alimta/Avastin . Most recent follow up on 08/01/23 he was doing quite well without any complains.   No other significant oncologic interval history since the patient was last seen.    Allergies:  has no known allergies.  Meds: Current Outpatient Medications  Medication Sig Dispense Refill   acetaminophen  (TYLENOL ) 325 MG tablet Take 2 tablets (650 mg total) by mouth every 6 (six) hours as needed.     bictegravir-emtricitabine -tenofovir  AF (BIKTARVY ) 50-200-25 MG  TABS tablet Take 1 tablet by mouth daily. 30 tablet 0   dexamethasone  (DECADRON ) 4 MG tablet Take 1 tablet (4 mg total) by mouth 2 (two) times daily. Start day before each pemetrexed  dose and continue for 3 days.Take with food. (Patient not taking: Reported on 08/01/2023) 6 tablet 5   dronabinol  (MARINOL ) 5 MG capsule Take 1 capsule (5 mg total) by mouth 2 (two) times daily before a meal. (Patient not taking: Reported on 04/12/2023) 60 capsule 0   folic acid  (FOLVITE ) 1 MG tablet Take 1 tablet (1 mg total) by mouth daily. Start 7 days before pemetrexed  chemotherapy. Continue until 21 days after pemetrexed  completed. (Patient not taking: Reported on 08/01/2023) 100 tablet 3   lidocaine -prilocaine  (EMLA ) cream Apply to affected area once 30 g 3   ondansetron  (ZOFRAN ) 8 MG tablet Take 1 tablet (8 mg total) by mouth every 8 (eight) hours as needed for nausea or vomiting. (Patient not taking: Reported on 08/01/2023) 30 tablet 1   oxyCODONE  (OXY IR/ROXICODONE ) 5 MG immediate release tablet Take 1 tablet (5 mg total) by mouth every 6 (six) hours as needed. 60 tablet 0   oxyCODONE  (OXYCONTIN ) 15 mg 12 hr tablet Take 1 tablet (15 mg total) by mouth every 12 (twelve) hours. 60 tablet 0   prochlorperazine  (COMPAZINE ) 10 MG tablet Take 1 tablet (10 mg total) by mouth every 6 (six) hours as needed for nausea or vomiting. (Patient not taking: Reported  on 08/01/2023) 30 tablet 1   senna-docusate (SENOKOT-S) 8.6-50 MG tablet Take 1 tablet by mouth 2 (two) times daily between meals as needed for mild constipation. (Patient not taking: Reported on 04/12/2023)     sucralfate  (CARAFATE ) 1 g tablet Take 1 tablet (1 g total) by mouth 4 (four) times daily -  with meals and at bedtime. Crush and dissolve in 10 mL's of warm water prior to swallowing, take 20 min prior to meals (Patient not taking: Reported on 08/01/2023) 120 tablet 0   telmisartan  (MICARDIS ) 40 MG tablet Take 1 tablet (40 mg total) by mouth daily. 30 tablet 6   No  current facility-administered medications for this encounter.   Facility-Administered Medications Ordered in Other Encounters  Medication Dose Route Frequency Provider Last Rate Last Admin   0.9 %  sodium chloride  infusion   Intravenous Continuous Ennever, Peter R, MD   Stopped at 08/01/23 1020   sodium chloride  flush (NS) 0.9 % injection 10 mL  10 mL Intracatheter PRN Ivor Mars, MD   10 mL at 08/01/23 1019    Physical Findings: The patient is in no acute distress. Patient is alert and oriented.  vitals were not taken for this visit. .  No significant changes. Lungs are clear to auscultation bilaterally. Heart has regular rate and rhythm. No palpable cervical, supraclavicular, or axillary adenopathy. Abdomen soft, non-tender, normal bowel sounds.   Lab Findings: Lab Results  Component Value Date   WBC 3.2 (L) 08/01/2023   HGB 11.4 (L) 08/01/2023   HCT 32.0 (L) 08/01/2023   MCV 98.8 08/01/2023   PLT 72 (L) 08/01/2023    Radiographic Findings: No results found.  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   The patient is recovering from the effects of radiation.  ***  Plan:  ***   *** minutes of total time was spent for this patient encounter, including preparation, face-to-face counseling with the patient and coordination of care, physical exam, and documentation of the encounter. ____________________________________  Noralee Beam, PhD, MD  This document serves as a record of services personally performed by Retta Caster, MD. It was created on his behalf by Lucky Sable, a trained medical scribe. The creation of this record is based on the scribe's personal observations and the provider's statements to them. This document has been checked and approved by the attending provider.

## 2023-08-01 NOTE — Progress Notes (Signed)
 Ok to treat with platelets 72 per Dr. Maria Shiner

## 2023-08-01 NOTE — Progress Notes (Unsigned)
 Patient will continue with cycle two of systemic treatment. He has finished radiation to his adrenal met. He will need CT before his next cycle. Scheduled for 08/02/2023.  Oncology Nurse Navigator Documentation     08/01/2023    9:30 AM  Oncology Nurse Navigator Flowsheets  Navigator Follow Up Date: 08/02/2023  Navigator Follow Up Reason: Scan Review  Navigator Location [facility]  Navigator Encounter Type Treatment  Patient Visit Type MedOnc  Treatment Phase Active Tx  Barriers/Navigation Needs No Barriers At This Time  Interventions None Required  Acuity Level 1-No Barriers  Time Spent with Patient 15

## 2023-08-02 ENCOUNTER — Encounter: Payer: Self-pay | Admitting: Radiation Oncology

## 2023-08-02 ENCOUNTER — Ambulatory Visit
Admission: RE | Admit: 2023-08-02 | Discharge: 2023-08-02 | Disposition: A | Discharge status: Home / Self Care | Source: Ambulatory Visit | Attending: Radiation Oncology | Admitting: Radiation Oncology

## 2023-08-02 ENCOUNTER — Ambulatory Visit (HOSPITAL_COMMUNITY)
Admission: RE | Admit: 2023-08-02 | Discharge: 2023-08-02 | Disposition: A | Discharge status: Home / Self Care | Source: Ambulatory Visit | Attending: Hematology & Oncology | Admitting: Hematology & Oncology

## 2023-08-02 ENCOUNTER — Encounter: Payer: Self-pay | Admitting: Hematology & Oncology

## 2023-08-02 VITALS — BP 130/86 | HR 84 | Temp 97.3°F | Resp 18 | Ht 73.0 in | Wt 175.1 lb

## 2023-08-02 DIAGNOSIS — Z7952 Long term (current) use of systemic steroids: Secondary | ICD-10-CM | POA: Insufficient documentation

## 2023-08-02 DIAGNOSIS — Z79624 Long term (current) use of inhibitors of nucleotide synthesis: Secondary | ICD-10-CM | POA: Insufficient documentation

## 2023-08-02 DIAGNOSIS — M51379 Other intervertebral disc degeneration, lumbosacral region without mention of lumbar back pain or lower extremity pain: Secondary | ICD-10-CM | POA: Insufficient documentation

## 2023-08-02 DIAGNOSIS — C7A1 Malignant poorly differentiated neuroendocrine tumors: Secondary | ICD-10-CM | POA: Insufficient documentation

## 2023-08-02 DIAGNOSIS — I2510 Atherosclerotic heart disease of native coronary artery without angina pectoris: Secondary | ICD-10-CM | POA: Insufficient documentation

## 2023-08-02 DIAGNOSIS — I700 Atherosclerosis of aorta: Secondary | ICD-10-CM | POA: Insufficient documentation

## 2023-08-02 DIAGNOSIS — Z79899 Other long term (current) drug therapy: Secondary | ICD-10-CM | POA: Insufficient documentation

## 2023-08-02 DIAGNOSIS — J90 Pleural effusion, not elsewhere classified: Secondary | ICD-10-CM | POA: Insufficient documentation

## 2023-08-02 HISTORY — DX: Personal history of irradiation: Z92.3

## 2023-08-02 MED ORDER — IOHEXOL 300 MG/ML  SOLN
100.0000 mL | Freq: Once | INTRAMUSCULAR | Status: AC | PRN
  Administered 2023-08-02: 100 mL via INTRAVENOUS

## 2023-08-02 MED ORDER — SODIUM CHLORIDE (PF) 0.9 % IJ SOLN
INTRAMUSCULAR | Status: AC
  Filled 2023-08-02: qty 50

## 2023-08-02 NOTE — Progress Notes (Signed)
 Christopher Henson is here today for follow up post radiation to the lung.  Lung Side: Left side  Does the patient complain of any of the following: Pain:Yes. He reports 5/10 back pain. Shortness of breath w/wo exertion: Yes Cough: Yes Nausea:Denies Vomiting: Denies       Diarrhea: Yes       Hemoptysis: Denies, but he reports when he blows his nose he may see blood. Pain with swallowing: Denies Swallowing/choking concerns: Denies Appetite: Good Energy Level: Fair Post radiation skin Changes: Denies   BP 130/86 (BP Location: Left Arm, Patient Position: Sitting)   Pulse 84   Temp (!) 97.3 F (36.3 C) (Temporal)   Resp 18   Ht 6\' 1"  (1.854 m)   Wt 175 lb 2 oz (79.4 kg)   SpO2 100%   BMI 23.10 kg/m

## 2023-08-03 ENCOUNTER — Encounter: Payer: Self-pay | Admitting: *Deleted

## 2023-08-03 DIAGNOSIS — C3492 Malignant neoplasm of unspecified part of left bronchus or lung: Secondary | ICD-10-CM | POA: Diagnosis not present

## 2023-08-03 DIAGNOSIS — B451 Cerebral cryptococcosis: Secondary | ICD-10-CM | POA: Diagnosis not present

## 2023-08-03 DIAGNOSIS — J9859 Other diseases of mediastinum, not elsewhere classified: Secondary | ICD-10-CM | POA: Diagnosis not present

## 2023-08-03 NOTE — Progress Notes (Signed)
 Reviewed CT which shows a mixed response.   Oncology Nurse Navigator Documentation     08/03/2023   10:45 AM  Oncology Nurse Navigator Flowsheets  Navigator Follow Up Date: 08/22/2023  Navigator Follow Up Reason: Follow-up Appointment;Chemotherapy  Navigator Location [facility]  Navigator Encounter Type Scan Review  Patient Visit Type MedOnc  Treatment Phase Active Tx  Barriers/Navigation Needs No Barriers At This Time  Interventions None Required  Acuity Level 1-No Barriers  Time Spent with Patient 15

## 2023-08-14 ENCOUNTER — Other Ambulatory Visit (HOSPITAL_BASED_OUTPATIENT_CLINIC_OR_DEPARTMENT_OTHER): Payer: Self-pay

## 2023-08-14 ENCOUNTER — Other Ambulatory Visit: Payer: Self-pay

## 2023-08-14 ENCOUNTER — Other Ambulatory Visit: Payer: Self-pay | Admitting: Hematology & Oncology

## 2023-08-14 MED ORDER — OXYCODONE HCL ER 15 MG PO T12A
15.0000 mg | EXTENDED_RELEASE_TABLET | Freq: Two times a day (BID) | ORAL | 0 refills | Status: DC
Start: 2023-08-14 — End: 2023-08-29
  Filled 2023-08-14: qty 60, 30d supply, fill #0

## 2023-08-15 ENCOUNTER — Other Ambulatory Visit (HOSPITAL_BASED_OUTPATIENT_CLINIC_OR_DEPARTMENT_OTHER): Payer: Self-pay

## 2023-08-15 ENCOUNTER — Other Ambulatory Visit: Payer: Self-pay | Admitting: Hematology & Oncology

## 2023-08-15 MED ORDER — OXYCODONE HCL 5 MG PO TABS
5.0000 mg | ORAL_TABLET | Freq: Four times a day (QID) | ORAL | 0 refills | Status: DC | PRN
  Filled 2023-08-15: qty 60, 15d supply, fill #0

## 2023-08-22 ENCOUNTER — Encounter: Payer: Self-pay | Admitting: Hematology & Oncology

## 2023-08-22 ENCOUNTER — Inpatient Hospital Stay

## 2023-08-22 ENCOUNTER — Other Ambulatory Visit: Payer: Self-pay

## 2023-08-22 ENCOUNTER — Inpatient Hospital Stay: Attending: Hematology & Oncology

## 2023-08-22 ENCOUNTER — Inpatient Hospital Stay (HOSPITAL_BASED_OUTPATIENT_CLINIC_OR_DEPARTMENT_OTHER): Admitting: Hematology & Oncology

## 2023-08-22 ENCOUNTER — Inpatient Hospital Stay: Admitting: Licensed Clinical Social Worker

## 2023-08-22 VITALS — BP 124/86 | HR 94 | Temp 98.5°F | Resp 18 | Ht 73.0 in | Wt 171.0 lb

## 2023-08-22 DIAGNOSIS — Z5111 Encounter for antineoplastic chemotherapy: Secondary | ICD-10-CM | POA: Insufficient documentation

## 2023-08-22 DIAGNOSIS — C7A1 Malignant poorly differentiated neuroendocrine tumors: Secondary | ICD-10-CM

## 2023-08-22 DIAGNOSIS — Z21 Asymptomatic human immunodeficiency virus [HIV] infection status: Secondary | ICD-10-CM | POA: Diagnosis not present

## 2023-08-22 DIAGNOSIS — Z5112 Encounter for antineoplastic immunotherapy: Secondary | ICD-10-CM | POA: Insufficient documentation

## 2023-08-22 LAB — CBC WITH DIFFERENTIAL (CANCER CENTER ONLY)
Abs Immature Granulocytes: 0.01 10*3/uL (ref 0.00–0.07)
Basophils Absolute: 0 10*3/uL (ref 0.0–0.1)
Basophils Relative: 0 %
Eosinophils Absolute: 0.2 10*3/uL (ref 0.0–0.5)
Eosinophils Relative: 6 %
HCT: 28.7 % — ABNORMAL LOW (ref 39.0–52.0)
Hemoglobin: 10.1 g/dL — ABNORMAL LOW (ref 13.0–17.0)
Immature Granulocytes: 0 %
Lymphocytes Relative: 30 %
Lymphs Abs: 0.8 10*3/uL (ref 0.7–4.0)
MCH: 35.3 pg — ABNORMAL HIGH (ref 26.0–34.0)
MCHC: 35.2 g/dL (ref 30.0–36.0)
MCV: 100.3 fL — ABNORMAL HIGH (ref 80.0–100.0)
Monocytes Absolute: 0.6 10*3/uL (ref 0.1–1.0)
Monocytes Relative: 23 %
Neutro Abs: 1.1 10*3/uL — ABNORMAL LOW (ref 1.7–7.7)
Neutrophils Relative %: 41 %
Platelet Count: 89 10*3/uL — ABNORMAL LOW (ref 150–400)
RBC: 2.86 MIL/uL — ABNORMAL LOW (ref 4.22–5.81)
RDW: 17.1 % — ABNORMAL HIGH (ref 11.5–15.5)
WBC Count: 2.8 10*3/uL — ABNORMAL LOW (ref 4.0–10.5)
nRBC: 0 % (ref 0.0–0.2)

## 2023-08-22 LAB — CMP (CANCER CENTER ONLY)
ALT: 51 U/L — ABNORMAL HIGH (ref 0–44)
AST: 42 U/L — ABNORMAL HIGH (ref 15–41)
Albumin: 4.2 g/dL (ref 3.5–5.0)
Alkaline Phosphatase: 83 U/L (ref 38–126)
Anion gap: 7 (ref 5–15)
BUN: 8 mg/dL (ref 6–20)
CO2: 26 mmol/L (ref 22–32)
Calcium: 9.1 mg/dL (ref 8.9–10.3)
Chloride: 106 mmol/L (ref 98–111)
Creatinine: 1.08 mg/dL (ref 0.61–1.24)
GFR, Estimated: >60 mL/min (ref >60)
Glucose, Bld: 140 mg/dL — ABNORMAL HIGH (ref 70–99)
Potassium: 3.8 mmol/L (ref 3.5–5.1)
Sodium: 139 mmol/L (ref 135–145)
Total Bilirubin: 0.3 mg/dL (ref 0.0–1.2)
Total Protein: 6.7 g/dL (ref 6.5–8.1)

## 2023-08-22 LAB — LACTATE DEHYDROGENASE: LDH: 339 U/L — ABNORMAL HIGH (ref 98–192)

## 2023-08-22 MED ORDER — CYANOCOBALAMIN 1000 MCG/ML IJ SOLN
1000.0000 ug | Freq: Once | INTRAMUSCULAR | Status: AC
Start: 2023-08-22 — End: 2023-08-22
  Administered 2023-08-22: 1000 ug via INTRAMUSCULAR
  Filled 2023-08-22: qty 1

## 2023-08-22 MED ORDER — SODIUM CHLORIDE 0.9 % IV SOLN
500.0000 mg/m2 | Freq: Once | INTRAVENOUS | Status: AC
  Administered 2023-08-22: 1000 mg via INTRAVENOUS
  Filled 2023-08-22: qty 40

## 2023-08-22 MED ORDER — SODIUM CHLORIDE 0.9 % IV SOLN
15.0000 mg/kg | Freq: Once | INTRAVENOUS | Status: AC
  Administered 2023-08-22: 1200 mg via INTRAVENOUS
  Filled 2023-08-22: qty 48

## 2023-08-22 MED ORDER — MEGESTROL ACETATE 400 MG/10ML PO SUSP: 400.0000 mg | Freq: Two times a day (BID) | ORAL | 1 refills | Status: DC

## 2023-08-22 MED ORDER — HEPARIN SOD (PORK) LOCK FLUSH 100 UNIT/ML IV SOLN
500.0000 [IU] | Freq: Once | INTRAVENOUS | Status: AC | PRN
  Administered 2023-08-22: 500 [IU]

## 2023-08-22 MED ORDER — MEGESTROL ACETATE 625 MG/5ML PO SUSP: 625.0000 mg | Freq: Every day | ORAL | 4 refills | Status: DC

## 2023-08-22 MED ORDER — SODIUM CHLORIDE 0.9 % IV SOLN: INTRAVENOUS | Status: DC

## 2023-08-22 MED ORDER — PROCHLORPERAZINE MALEATE 10 MG PO TABS
10.0000 mg | ORAL_TABLET | Freq: Once | ORAL | Status: AC
Start: 2023-08-22 — End: 2023-08-22
  Administered 2023-08-22: 10 mg via ORAL
  Filled 2023-08-22: qty 1

## 2023-08-22 MED ORDER — SODIUM CHLORIDE 0.9% FLUSH
10.0000 mL | INTRAVENOUS | Status: DC | PRN
  Administered 2023-08-22: 10 mL

## 2023-08-22 NOTE — Progress Notes (Signed)
 Hematology and Oncology Follow Up Visit  [name] [id number] 1981-03-12 43 y.o. 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 Kalil is in for his follow-up.  He still lives a little bit of weight.  I think he is not taking the Marinol  as often as he should.  I will add some Megace elixir to try to help with his appetite.  We did do a CT scan on him.  The CT scan was done on 08/02/2023.  It showed a mixed response.  He had some resolution of a nodule in the left lung.  However, there were 2 new nodules that were subpleural.  One measured 1.3 cm and the other measured 0.9 cm.  I spoke with Dr. Eloise Hake of Radiation Oncology.  We will see if he is a candidate for radiosurgery to this area.  He had decrease in the mediastinal hilar lymph nodes.  He had decrease in his left adrenal met.  I think that he may have a tumor that might be low but more responsive to radiotherapy than chemotherapy.  Hopefully, he will be able to gain a little bit of weight.  He has had no fever.  There has been no obvious bleeding.  He has had no problems with respect to the HIV.  He has had no mouth sores.  He has had no headache.  Overall, I would say that his performance status is probably ECOG 2.    Medications:  Current Outpatient Medications:    acetaminophen  (TYLENOL ) 325 MG tablet, Take 2 tablets (650 mg total) by mouth every 6 (six) hours as needed., Disp: , Rfl:    bictegravir-emtricitabine -tenofovir  AF (BIKTARVY ) 50-200-25 MG TABS tablet, Take 1 tablet by mouth daily., Disp: 30 tablet, Rfl: 0   dronabinol  (MARINOL ) 5 MG  capsule, Take 1 capsule (5 mg total) by mouth 2 (two) times daily before a meal., Disp: 60 capsule, Rfl: 0   lidocaine -prilocaine  (EMLA ) cream, Apply to affected area once, Disp: 30 g, Rfl: 3   oxyCODONE  (OXY IR/ROXICODONE ) 5 MG immediate release tablet, Take 1 tablet (5 mg total) by mouth every 6 (six) hours as needed., Disp: 60 tablet, Rfl: 0   oxyCODONE  (OXYCONTIN ) 15 mg 12 hr tablet, Take 1 tablet (15 mg total) by mouth every 12 (twelve) hours., Disp: 60 tablet, Rfl: 0   dexamethasone  (DECADRON ) 4 MG tablet, Take 1 tablet (4 mg total) by mouth 2 (two) times daily. Start day before each pemetrexed  dose and continue for 3 days.Take with food. (Patient not taking: Reported on 08/01/2023), Disp: 6 tablet, Rfl: 5   folic acid  (FOLVITE ) 1 MG tablet, Take 1 tablet (1 mg total) by mouth daily. Start 7 days before pemetrexed  chemotherapy. Continue until 21 days after pemetrexed  completed. (Patient not taking: Reported on 08/01/2023), Disp: 100 tablet, Rfl: 3   ondansetron  (ZOFRAN ) 8 MG tablet, Take 1 tablet (8 mg total) by mouth every 8 (eight) hours as needed for nausea or vomiting. (Patient not taking: Reported on 08/01/2023), Disp: 30 tablet, Rfl: 1   prochlorperazine  (COMPAZINE ) 10 MG tablet, Take 1 tablet (10 mg total) by mouth every 6 (  six) hours as needed for nausea or vomiting. (Patient not taking: Reported on 08/22/2023), Disp: 30 tablet, Rfl: 1   senna-docusate (SENOKOT-S) 8.6-50 MG tablet, Take 1 tablet by mouth 2 (two) times daily between meals as needed for mild constipation. (Patient not taking: Reported on 04/12/2023), Disp: , Rfl:    sucralfate  (CARAFATE ) 1 g tablet, Take 1 tablet (1 g total) by mouth 4 (four) times daily -  with meals and at bedtime. Crush and dissolve in 10 mL's of warm water prior to swallowing, take 20 min prior to meals (Patient not taking: Reported on 08/22/2023), Disp: 120 tablet, Rfl: 0   telmisartan  (MICARDIS ) 40 MG tablet, Take 1 tablet (40 mg total) by mouth daily. (Patient  not taking: Reported on 08/22/2023), Disp: 30 tablet, Rfl: 6  Allergies: No Known Allergies  Past Medical History, Surgical history, Social history, and Family History were reviewed and updated.  Review of Systems: Review of Systems  Constitutional: Negative.   HENT:  Negative.    Eyes: Negative.   Respiratory:  Positive for cough and shortness of breath.   Cardiovascular:  Positive for chest pain.  Gastrointestinal: Negative.   Endocrine: Negative.   Genitourinary: Negative.    Musculoskeletal: Negative.   Skin: Negative.   Neurological: Negative.   Hematological: Negative.   Psychiatric/Behavioral: Negative.      Physical Exam:  Temperature is 98.5.  Pulse 94.  Blood pressure 124/86.  Weight is 171 pounds.   Wt Readings from Last 3 Encounters:  08/22/23 171 lb (77.6 kg)  08/02/23 175 lb 2 oz (79.4 kg)  08/01/23 171 lb (77.6 kg)    Physical Exam Vitals reviewed.  HENT:     Head: Normocephalic and atraumatic.  Eyes:     Pupils: Pupils are equal, round, and reactive to light.  Cardiovascular:     Rate and Rhythm: Normal rate and regular rhythm.     Heart sounds: Normal heart sounds.     Comments: Cardiac exam is tachycardic but regular.  There are no murmurs. Pulmonary:     Effort: Pulmonary effort is normal.     Breath sounds: Normal breath sounds.     Comments: His lungs sound relatively clear bilaterally.  Maybe a little bit decreased and breath sounds are on the left lower lung. Abdominal:     General: Bowel sounds are normal.     Palpations: Abdomen is soft.     Comments: Abdominal exam is soft.  He has good bowel sounds.  There is no fluid wave.  There is no palpable liver or spleen tip.  Musculoskeletal:        General: No tenderness or deformity. Normal range of motion.     Cervical back: Normal range of motion.  Lymphadenopathy:     Cervical: No cervical adenopathy.  Skin:    General: Skin is warm and dry.     Findings: No erythema or rash.   Neurological:     Mental Status: He is alert and oriented to person, place, and time.  Psychiatric:        Behavior: Behavior normal.        Thought Content: Thought content normal.        Judgment: Judgment normal.     Lab Results  Component Value Date   WBC 3.2 (L) 08/01/2023   HGB 11.4 (L) 08/01/2023   HCT 32.0 (L) 08/01/2023   MCV 98.8 08/01/2023   PLT 72 (L) 08/01/2023     Chemistry  Component Value Date/Time   NA 142 08/01/2023 0805   NA 143 02/25/2019 1041   K 3.5 08/01/2023 0805   CL 107 08/01/2023 0805   CO2 27 08/01/2023 0805   BUN 8 08/01/2023 0805   BUN 8 02/25/2019 1041   CREATININE 0.98 08/01/2023 0805   CREATININE 0.89 12/14/2022 1132      Component Value Date/Time   CALCIUM  8.8 (L) 08/01/2023 0805   ALKPHOS 71 08/01/2023 0805   AST 21 08/01/2023 0805   ALT 22 08/01/2023 0805   BILITOT 0.4 08/01/2023 0805       Impression and Plan: Mr. Kilby is a very nice 43 year old African-American male.  He actually is one of the few people who was born on [birth year].  He has what certainly is acting like a large cell neuroendocrine carcinoma.    I am glad that the scan showed that there was some response.  I do think that radiotherapy will help with these 2 left lung subpleural nodules.  Hopefully, he will eat a little better and gained a little bit more weight.  This is all about quality of life.  I forgot to mention that if his pain seems to be under decent control.  I know this is difficult for him.  I know he is trying his best.  We will plan to get him back to see us  in another 3 weeks.    Ivor Mars, MD 5/14/20258:50 AM

## 2023-08-22 NOTE — Patient Instructions (Signed)

## 2023-08-22 NOTE — Progress Notes (Signed)
 [facility] CSW Progress Note  Visual merchandiser met with patient in infusion to assess needs.  Patient engaged in conversation and answered questions appropriately.  Offered a bag of food from the pantry which he accepted due to food insecurity.  He continues living with his sister.  Patient expressed no other needs at this time.    Kennth Peal, LCSW Clinical Social Worker [facility]    Patient is participating in a Managed Medicaid Plan:  Yes

## 2023-08-22 NOTE — Progress Notes (Signed)
 Ok to treat with ANC 1.1 and pltc 89 today.  Jobe Mulder [place], [place], BCPS, BCOP 08/22/2023 10:38 AM

## 2023-08-22 NOTE — Patient Instructions (Signed)
 [facility]  Discharge Instructions: Thank you for choosing [facility] to provide your oncology and hematology care.   If you have a lab appointment with the Cancer Center, please go directly to the Cancer Center and check in at the registration area.  Wear comfortable clothing and clothing appropriate for easy access to any Portacath or PICC line.   We strive to give you quality time with your provider. You may need to reschedule your appointment if you arrive late (15 or more minutes).  Arriving late affects you and other patients whose appointments are after yours.  Also, if you miss three or more appointments without notifying the office, you may be dismissed from the clinic at the provider's discretion.      For prescription refill requests, have your pharmacy contact our office and allow 72 hours for refills to be completed.    Today you received the following chemotherapy and/or immunotherapy agents alimta,avastin     To help prevent nausea and vomiting after your treatment, we encourage you to take your nausea medication as directed.  BELOW ARE SYMPTOMS THAT SHOULD BE REPORTED IMMEDIATELY: *FEVER GREATER THAN 100.4 F (38 C) OR HIGHER *CHILLS OR SWEATING *NAUSEA AND VOMITING THAT IS NOT CONTROLLED WITH YOUR NAUSEA MEDICATION *UNUSUAL SHORTNESS OF BREATH *UNUSUAL BRUISING OR BLEEDING *URINARY PROBLEMS (pain or burning when urinating, or frequent urination) *BOWEL PROBLEMS (unusual diarrhea, constipation, pain near the anus) TENDERNESS IN MOUTH AND THROAT WITH OR WITHOUT PRESENCE OF ULCERS (sore throat, sores in mouth, or a toothache) UNUSUAL RASH, SWELLING OR PAIN  UNUSUAL VAGINAL DISCHARGE OR ITCHING   Items with * indicate a potential emergency and should be followed up as soon as possible or go to the Emergency Department if any problems should occur.  Please show the CHEMOTHERAPY ALERT CARD or  IMMUNOTHERAPY ALERT CARD at check-in to the Emergency Department and triage nurse. Should you have questions after your visit or need to cancel or reschedule your appointment, please contact [facility] - A DEPT OF [facility]  [phone] and follow the prompts.  Office hours are 8:00 a.m. to 4:30 p.m. Monday - Friday. Please note that voicemails left after 4:00 p.m. may not be returned until the following business day.  We are closed weekends and major holidays. You have access to a nurse at all times for urgent questions. Please call the main number to the clinic [phone] and follow the prompts.  For any non-urgent questions, you may also contact your provider using MyChart. We now offer e-Visits for anyone 48 and older to request care online for non-urgent symptoms. For details visit [email or web address].   Also download the MyChart app! Go to the app store, search "MyChart", open the app, select [place], and log in with your MyChart username and password.

## 2023-08-22 NOTE — Progress Notes (Signed)
 Notified Provider of prior authorization denial for Megestrol Acetate 625 mg/5 ml Suspension. Preferred Drug is Megestrol Suspension 400 mg/10 ml. Medication changed to preferred drug. Patient notified. No other needs or concerns noted at this time.

## 2023-08-23 ENCOUNTER — Other Ambulatory Visit: Payer: Self-pay

## 2023-08-23 ENCOUNTER — Encounter: Payer: Self-pay | Admitting: *Deleted

## 2023-08-23 NOTE — Progress Notes (Signed)
 Patient here for consideration of treatment. Recent scans show mixed response. He will proceed with cycle three, but also add radiation for targeted treatment to the areas of progression.   Oncology Nurse Navigator Documentation     08/22/2023   10:00 AM  Oncology Nurse Navigator Flowsheets  Navigator Follow Up Date: 09/12/2023  Navigator Follow Up Reason: Follow-up Appointment;Chemotherapy  Navigator Location [facility]  Navigator Encounter Type Follow-up Appt  Patient Visit Type MedOnc  Treatment Phase Active Tx  Barriers/Navigation Needs No Barriers At This Time  Interventions Psycho-Social Support  Acuity Level 1-No Barriers  Time Spent with Patient 15

## 2023-08-24 ENCOUNTER — Telehealth: Payer: Self-pay | Admitting: Radiation Oncology

## 2023-08-24 NOTE — Telephone Encounter (Signed)
 Left message for patient to call back to schedule consult per 5/15 referral.

## 2023-08-25 ENCOUNTER — Other Ambulatory Visit: Payer: Self-pay

## 2023-08-26 ENCOUNTER — Other Ambulatory Visit: Payer: Self-pay

## 2023-08-27 ENCOUNTER — Encounter: Payer: Self-pay | Admitting: Hematology & Oncology

## 2023-08-27 ENCOUNTER — Telehealth: Payer: Self-pay | Admitting: Radiation Oncology

## 2023-08-27 NOTE — Telephone Encounter (Signed)
 Left message for patient to call back to schedule consult per 5/15 referral.

## 2023-08-28 NOTE — Progress Notes (Signed)
 Location of tumor and Histology per Pathology Report: Left subpleural   Biopsy: NA  Past/Anticipated interventions by surgeon, if anyNA  Past/Anticipated interventions by medical oncology, if any:     Pain issues, if any:  yes constant pain to left side. Patient rates pain 7/10.   SAFETY ISSUES: Prior radiation? Yes  Pacemaker/ICD? no Possible current pregnancy? no Is the patient on methotrexate? no  Current Complaints / other details:  BP 125/87   Pulse 88   Temp 98.6 F (37 C)   Resp 18   Ht 6' (1.829 m)   Wt 172 lb (78 kg)   SpO2 100%   BMI 23.33 kg/m

## 2023-08-28 NOTE — Progress Notes (Signed)
 Radiation Oncology         [phone] ________________________________  Reconsultation Note  Name: Christopher Henson MRN: [id number]  Date: 08/29/2023  DOB: Oct 11, 1980  CC:Tysinger, Christiane Cowing, PA-C  Ennever, Sherryll Donald, MD   REFERRING PHYSICIAN: Ivor Mars, MD  DIAGNOSIS: The primary encounter diagnosis was Malignant neoplasm of upper lobe bronchus, left (HCC). A diagnosis of Large cell neuroendocrine carcinoma (HCC) was also pertinent to this visit.  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   HISTORY OF PRESENT ILLNESS::Christopher Henson is a 43 y.o. male who is seen as a courtesy of Dr. Maria Shiner for an opinion concerning radiation therapy as part of management for his recurrent lung cancer. Patient was last seen in office on 08/02/23 for a routine follow up.   In the interval since he was last seen, patient presented for a follow up with a Dr. Maria Shiner 08/22/23. During the visit, they reviewed his most recent CT scan which showed a mixed response to treatment. Dr. Maria Shiner has recommended undergoing radiotherapy as patient showed better response to it than chemotherapy.  Most recent CT chest preformed on 08/02/23 showed interval mixed response to therapy; previously visualized subpleural left upper lobe pulmonary metastasis has resolved, however there are 2 new subpleural left pulmonary metastases. One measured 1.3 cm and the other measured 0.9 cm. Scan also noted mediastinal and bilateral hilar nodal metastases are decreased in size and left adrenal metastasis is decreased in size.    PREVIOUS RADIATION THERAPY: Yes  Intent: palliative First Treatment Date: 2023-06-18 Last Treatment Date: 2023-07-06   Plan Name: Lung_L Site: Lung, Left Technique: 3D Mode: Photon Dose Per Fraction: 2.5 Gy Prescribed Dose (Delivered / Prescribed): 37.5 Gy / 37.5 Gy Prescribed Fxs (Delivered / Prescribed): 15 /  15   Plan Name: Abd Site: Abdomen Technique: 3D Mode: Photon Dose Per Fraction: 2.5 Gy Prescribed Dose (Delivered / Prescribed): 35 Gy / 35 Gy Prescribed Fxs (Delivered / Prescribed): 14 / 14 PAST MEDICAL HISTORY:  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 Retta Caster   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   Protein calorie malnutrition (HCC)    Small cell lung cancer, 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   Thrombocytopenia (HCC) 06/2018    PAST SURGICAL HISTORY: Past Surgical History:  Procedure Laterality Date   BIOPSY  12/21/2022   Procedure: BIOPSY;  Surgeon: Margaretann Sharper, MD;  Location: [facility];  Service: Endoscopy;;   BRONCHIAL WASHINGS  12/21/2022   Procedure: BRONCHIAL WASHINGS;  Surgeon: Margaretann Sharper, MD;  Location: [facility];  Service: Endoscopy;;   ENDOBRONCHIAL ULTRASOUND Left 12/21/2022   Procedure: ENDOBRONCHIAL ULTRASOUND;  Surgeon: Margaretann Sharper, MD;  Location: [facility];  Service: Endoscopy;  Laterality: Left;  Endobronchial ultrasound, biopsy, need fluoroscopy   FINE NEEDLE ASPIRATION  12/21/2022   Procedure: FINE NEEDLE ASPIRATION (FNA) LINEAR;  Surgeon: Margaretann Sharper, MD;  Location: [facility];  Service: Endoscopy;;   IR BONE MARROW BIOPSY & ASPIRATION  12/27/2022   IR IMAGING GUIDED PORT INSERTION  12/27/2022   IR LUMBAR PUNCTURE  12/22/2022   IR PERC PLEURAL DRAIN W/INDWELL CATH W/IMG GUIDE  01/15/2023  IR REMOVAL OF PLURAL CATH W/CUFF  03/27/2023   LUMBAR PUNCTURE  06/2018   during eval for meningitis    VIDEO BRONCHOSCOPY Left 12/21/2022   Procedure: VIDEO BRONCHOSCOPY WITH FLUORO, endobronchial ultrasound;  Surgeon: Margaretann Sharper, MD;  Location: [facility];   Service: Endoscopy;  Laterality: Left;  Schedule for 12/20/2022 12/21/2022    FAMILY HISTORY:  Family History  Problem Relation Age of Onset   Diabetes Mother    Hypertension Mother    Diabetes Father    Hypertension Father    Healthy Sister    Healthy Brother    Healthy Sister    Healthy Sister    Healthy Sister    Healthy Brother    Healthy Brother    Heart disease Neg Hx    Cancer Neg Hx    Stroke Neg Hx     SOCIAL HISTORY:  Social History   Tobacco Use   Smoking status: Former    Current packs/day: 0.00    Types: Cigarettes    Quit date: 12/18/2022    Years since quitting: 0.6   Smokeless tobacco: Never  Vaping Use   Vaping status: Former  Substance Use Topics   Alcohol use: Yes    Comment: 1/2 pint on weekend ; liquor   Drug use: Yes    Frequency: 7.0 times per week    Types: Marijuana    Comment: daily    ALLERGIES: No Known Allergies  MEDICATIONS:  Current Outpatient Medications  Medication Sig Dispense Refill   acetaminophen  (TYLENOL ) 325 MG tablet Take 2 tablets (650 mg total) by mouth every 6 (six) hours as needed.     bictegravir-emtricitabine -tenofovir  AF (BIKTARVY ) 50-200-25 MG TABS tablet Take 1 tablet by mouth daily. 30 tablet 0   dexamethasone  (DECADRON ) 4 MG tablet Take 1 tablet (4 mg total) by mouth 2 (two) times daily. Start day before each pemetrexed  dose and continue for 3 days.Take with food. (Patient not taking: Reported on 08/01/2023) 6 tablet 5   dronabinol  (MARINOL ) 5 MG capsule Take 1 capsule (5 mg total) by mouth 2 (two) times daily before a meal. 60 capsule 0   folic acid  (FOLVITE ) 1 MG tablet Take 1 tablet (1 mg total) by mouth daily. Start 7 days before pemetrexed  chemotherapy. Continue until 21 days after pemetrexed  completed. (Patient not taking: Reported on 08/01/2023) 100 tablet 3   lidocaine -prilocaine  (EMLA ) cream Apply to affected area once 30 g 3   megestrol  (MEGACE  ES) 625 MG/5ML suspension Take 5 mLs (625 mg total) by mouth  daily. 150 mL 4   megestrol  (MEGACE ) 400 MG/10ML suspension Take 10 mLs (400 mg total) by mouth 2 (two) times daily. 240 mL 1   ondansetron  (ZOFRAN ) 8 MG tablet Take 1 tablet (8 mg total) by mouth every 8 (eight) hours as needed for nausea or vomiting. (Patient not taking: Reported on 08/01/2023) 30 tablet 1   oxyCODONE  (OXY IR/ROXICODONE ) 5 MG immediate release tablet Take 1 tablet (5 mg total) by mouth every 6 (six) hours as needed. 60 tablet 0   oxyCODONE  (OXYCONTIN ) 15 mg 12 hr tablet Take 1 tablet (15 mg total) by mouth every 12 (twelve) hours. 60 tablet 0   prochlorperazine  (COMPAZINE ) 10 MG tablet Take 1 tablet (10 mg total) by mouth every 6 (six) hours as needed for nausea or vomiting. (Patient not taking: Reported on 08/22/2023) 30 tablet 1   senna-docusate (SENOKOT-S) 8.6-50 MG tablet Take 1 tablet by mouth 2 (two) times daily between meals as  needed for mild constipation. (Patient not taking: Reported on 04/12/2023)     sucralfate  (CARAFATE ) 1 g tablet Take 1 tablet (1 g total) by mouth 4 (four) times daily -  with meals and at bedtime. Crush and dissolve in 10 mL's of warm water prior to swallowing, take 20 min prior to meals (Patient not taking: Reported on 08/22/2023) 120 tablet 0   telmisartan  (MICARDIS ) 40 MG tablet Take 1 tablet (40 mg total) by mouth daily. (Patient not taking: Reported on 08/22/2023) 30 tablet 6   No current facility-administered medications for this encounter.    REVIEW OF SYSTEMS:  A 10+ POINT REVIEW OF SYSTEMS WAS OBTAINED including neurology, dermatology, psychiatry, cardiac, respiratory, lymph, extremities, GI, GU, musculoskeletal, constitutional, reproductive, HEENT.  He denies any significant cough or breathing issues.  His esophagitis from her previous radiation therapy has completely resolved.  Does complain of some pain along the left lateral chest area which has been present for just a few weeks.   PHYSICAL EXAM:  height is 6' (1.829 m) and weight is 172 lb (78  kg). His temperature is 98.6 F (37 C). His blood pressure is 125/87 and his pulse is 88. His respiration is 18 and oxygen  saturation is 100%.   General: Alert and oriented, in no acute distress HEENT: Head is normocephalic. Extraocular movements are intact.  Neck: Neck is supple, no palpable cervical or supraclavicular lymphadenopathy. Heart: Regular in rate and rhythm with no murmurs, rubs, or gallops. Chest: Clear to auscultation bilaterally, with no rhonchi, wheezes, or rales. Abdomen: Soft, nontender, nondistended, with no rigidity or guarding. Extremities: No cyanosis or edema. Lymphatics: see Neck Exam Skin: No concerning lesions. Musculoskeletal: symmetric strength and muscle tone throughout. Neurologic: Cranial nerves II through XII are grossly intact. No obvious focalities. Speech is fluent. Coordination is intact. Psychiatric: Judgment and insight are intact. Affect is appropriate.   ECOG = 1  0 - Asymptomatic (Fully active, able to carry on all predisease activities without restriction)  1 - Symptomatic but completely ambulatory (Restricted in physically strenuous activity but ambulatory and able to carry out work of a light or sedentary nature. For example, light housework, office work)  2 - Symptomatic, <50% in bed during the day (Ambulatory and capable of all self care but unable to carry out any work activities. Up and about more than 50% of waking hours)  3 - Symptomatic, >50% in bed, but not bedbound (Capable of only limited self-care, confined to bed or chair 50% or more of waking hours)  4 - Bedbound (Completely disabled. Cannot carry on any self-care. Totally confined to bed or chair)  5 - Death   Aurea Blossom MM, Creech RH, Tormey DC, et al. [phone]). "Toxicity and response criteria of the [facility]". Am. Hillard Lowes. Oncol. 5 (6): 649-55  LABORATORY DATA:  Lab Results  Component Value Date   WBC 2.8 (L) 08/22/2023   HGB 10.1 (L) 08/22/2023   HCT  28.7 (L) 08/22/2023   MCV 100.3 (H) 08/22/2023   PLT 89 (L) 08/22/2023   NEUTROABS 1.1 (L) 08/22/2023   Lab Results  Component Value Date   NA 139 08/22/2023   K 3.8 08/22/2023   CL 106 08/22/2023   CO2 26 08/22/2023   GLUCOSE 140 (H) 08/22/2023   BUN 8 08/22/2023   CREATININE 1.08 08/22/2023   CALCIUM  9.1 08/22/2023      RADIOGRAPHY: CT CHEST ABDOMEN PELVIS W CONTRAST Result Date: 08/02/2023 CLINICAL DATA:  Non-small cell lung cancer (  NSCLC), metastatic, assess treatment response Status post chemotherapy for metastatic non-small cell lung cancer. * Tracking Code: BO * EXAM: CT CHEST, ABDOMEN, AND PELVIS WITH CONTRAST TECHNIQUE: Multidetector CT imaging of the chest, abdomen and pelvis was performed following the standard protocol during bolus administration of intravenous contrast. RADIATION DOSE REDUCTION: This exam was performed according to the departmental dose-optimization program which includes automated exposure control, adjustment of the mA and/or kV according to patient size and/or use of iterative reconstruction technique. CONTRAST:  OMNIPAQUE  IOHEXOL  300 MG/ML  SOLN COMPARISON:  05/28/2023 CT chest, abdomen and pelvis. FINDINGS: CT CHEST FINDINGS Cardiovascular: Normal heart size. No significant pericardial effusion/thickening. Left anterior descending coronary atherosclerosis. Right internal jugular Port-A-Cath terminates at the cavoatrial junction. Great vessels are normal in course and caliber. No central pulmonary emboli. Mediastinum/Nodes: No significant thyroid  nodules. Unremarkable esophagus. No axillary adenopathy. Bulky left prevascular 4.3 cm short axis diameter nodal conglomerate (series 2/image 24), decreased from 4.9 cm. Mildly enlarged 1.0 cm left pericardiophrenic node (series 2/image 50), decreased from 1.7 cm. No new pathologically enlarged mediastinal nodes. Stable mildly enlarged 1.0 cm right hilar node (series 2/image 30). Mildly enlarged 1.0 cm left hilar  node on series 2/image 28, substantially decreased from 3.2 cm. Lungs/Pleura: No pneumothorax. No right pleural effusion. Trace loculated basilar left pleural effusion is similar. Left pleural thickening and enhancement is decreased. Mild centrilobular and paraseptal emphysema. No acute consolidative airspace disease. Previously visualized 1.0 cm solid posterior left upper lobe subpleural nodule from 05/28/2023 chest CT has resolved. There are 2 new subpleural solid posterior left pulmonary nodules measuring 1.3 cm in the posterior apical left upper lobe on series 6/image 33 and 0.9 cm in the posterior aspect of the superior segment left lower lobe on series 6/image 89. Musculoskeletal:  No aggressive appearing focal osseous lesions. CT ABDOMEN PELVIS FINDINGS Hepatobiliary: Normal liver with no liver mass. Normal gallbladder with no radiopaque cholelithiasis. No biliary ductal dilatation. Pancreas: Normal, with no mass or duct dilation. Spleen: Normal size. No mass. Adrenals/Urinary Tract: No right adrenal nodules. Left adrenal 1.4 cm nodule, decreased from 1.8 cm. No hydronephrosis. Chronic malrotation of the right kidney. No contour deforming renal masses. Normal bladder. Stomach/Bowel: Normal non-distended stomach. Normal caliber small bowel with no small bowel wall thickening. Normal appendix. Normal large bowel with no diverticulosis, large bowel wall thickening or pericolonic fat stranding. Vascular/Lymphatic: Atherosclerotic nonaneurysmal abdominal aorta. Patent portal, splenic, hepatic and renal veins. Mildly enlarged 1.0 cm right external iliac node (series 2/image 106), decreased from 1.5 cm. Mildly enlarged 1.2 cm right common iliac node (series 2/image 101, stable. Separate mildly enlarged 1.3 cm right common iliac node (series 2/image 95), mildly increased from 1.0 cm. No new pathologically enlarged abdominopelvic nodes. Reproductive: Normal size prostate. Other: No pneumoperitoneum, ascites or focal  fluid collection. Musculoskeletal: Previously visualized lytic L1 and L2 vertebral metastases are newly sclerotic and stable size, largest 2.2 cm at L1 on series 5/image 92. No new focal osseous lesions. Mild-to-moderate degenerative disc disease at L5-S1. IMPRESSION: 1. Interval mixed response to therapy. 2. Previously visualized subpleural left upper lobe pulmonary metastasis has resolved, however there are 2 new subpleural left pulmonary metastases. 3. Trace loculated basilar left pleural effusion is similar. Left pleural thickening and enhancement is decreased. 4. Mediastinal and bilateral hilar nodal metastases are decreased in size. 5. Left adrenal metastasis is decreased in size. 6. Mild mixed changes in the right pelvic metastatic adenopathy as detailed. 7. Previously visualized lytic L1 and L2 vertebral metastases are  newly sclerotic and stable size. 8. One vessel coronary atherosclerosis. 9. Aortic Atherosclerosis (ICD10-I70.0) and Emphysema (ICD10-J43.9). Electronically Signed   By: Levell Reach M.D.   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   Recent scans shows stable disease except for 2 subpleural nodules in the left lung.  He would be a good candidate for SBRT directed at these 2 lesions.  I discussed the overall treatment course side effects and potential toxicities of radiation therapy in this situation with the patient.  He appears to understand and wishes to proceed with planned course of treatment.    PLAN: He will be scheduled for SBRT simulation in the near future.  Anticipate 3 SBRT sessions directed at  each subpleural nodule in the left lung.  45 minutes of total time was spent for this patient encounter, including preparation, face-to-face counseling with the patient and coordination of care, physical exam, and documentation of the  encounter.   ------------------------------------------------  Noralee Beam, PhD, MD  This document serves as a record of services personally performed by Retta Caster, MD. It was created on his behalf by Lucky Sable, a trained medical scribe. The creation of this record is based on the scribe's personal observations and the provider's statements to them. This document has been checked and approved by the attending provider.

## 2023-08-29 ENCOUNTER — Telehealth: Payer: Self-pay

## 2023-08-29 ENCOUNTER — Other Ambulatory Visit: Payer: Self-pay | Admitting: Internal Medicine

## 2023-08-29 ENCOUNTER — Other Ambulatory Visit (HOSPITAL_BASED_OUTPATIENT_CLINIC_OR_DEPARTMENT_OTHER): Payer: Self-pay

## 2023-08-29 ENCOUNTER — Other Ambulatory Visit: Payer: Self-pay | Admitting: Hematology & Oncology

## 2023-08-29 ENCOUNTER — Encounter: Payer: Self-pay | Admitting: *Deleted

## 2023-08-29 ENCOUNTER — Ambulatory Visit
Admission: RE | Admit: 2023-08-29 | Discharge: 2023-08-29 | Disposition: A | Discharge status: Home / Self Care | Source: Ambulatory Visit | Attending: Radiation Oncology | Admitting: Radiation Oncology

## 2023-08-29 ENCOUNTER — Encounter: Payer: Self-pay | Admitting: Radiation Oncology

## 2023-08-29 VITALS — BP 125/87 | HR 88 | Temp 98.6°F | Resp 18 | Ht 72.0 in | Wt 172.0 lb

## 2023-08-29 DIAGNOSIS — D696 Thrombocytopenia, unspecified: Secondary | ICD-10-CM | POA: Diagnosis not present

## 2023-08-29 DIAGNOSIS — B20 Human immunodeficiency virus [HIV] disease: Secondary | ICD-10-CM | POA: Diagnosis not present

## 2023-08-29 DIAGNOSIS — C7B8 Other secondary neuroendocrine tumors: Secondary | ICD-10-CM | POA: Diagnosis not present

## 2023-08-29 DIAGNOSIS — Z7952 Long term (current) use of systemic steroids: Secondary | ICD-10-CM | POA: Diagnosis not present

## 2023-08-29 DIAGNOSIS — J90 Pleural effusion, not elsewhere classified: Secondary | ICD-10-CM | POA: Diagnosis not present

## 2023-08-29 DIAGNOSIS — C3412 Malignant neoplasm of upper lobe, left bronchus or lung: Secondary | ICD-10-CM

## 2023-08-29 DIAGNOSIS — C7951 Secondary malignant neoplasm of bone: Secondary | ICD-10-CM | POA: Diagnosis not present

## 2023-08-29 DIAGNOSIS — I700 Atherosclerosis of aorta: Secondary | ICD-10-CM | POA: Diagnosis not present

## 2023-08-29 DIAGNOSIS — Z79899 Other long term (current) drug therapy: Secondary | ICD-10-CM | POA: Insufficient documentation

## 2023-08-29 DIAGNOSIS — Z87891 Personal history of nicotine dependence: Secondary | ICD-10-CM | POA: Diagnosis not present

## 2023-08-29 DIAGNOSIS — Z923 Personal history of irradiation: Secondary | ICD-10-CM | POA: Diagnosis not present

## 2023-08-29 DIAGNOSIS — Z79624 Long term (current) use of inhibitors of nucleotide synthesis: Secondary | ICD-10-CM | POA: Insufficient documentation

## 2023-08-29 DIAGNOSIS — J432 Centrilobular emphysema: Secondary | ICD-10-CM | POA: Diagnosis not present

## 2023-08-29 DIAGNOSIS — C7A1 Malignant poorly differentiated neuroendocrine tumors: Secondary | ICD-10-CM | POA: Diagnosis not present

## 2023-08-29 DIAGNOSIS — I2510 Atherosclerotic heart disease of native coronary artery without angina pectoris: Secondary | ICD-10-CM | POA: Insufficient documentation

## 2023-08-29 DIAGNOSIS — F1010 Alcohol abuse, uncomplicated: Secondary | ICD-10-CM | POA: Diagnosis not present

## 2023-08-29 DIAGNOSIS — K219 Gastro-esophageal reflux disease without esophagitis: Secondary | ICD-10-CM | POA: Diagnosis not present

## 2023-08-29 MED ORDER — OXYCODONE HCL ER 15 MG PO T12A
15.0000 mg | EXTENDED_RELEASE_TABLET | Freq: Two times a day (BID) | ORAL | 0 refills | Status: DC
Start: 2023-08-29 — End: 2023-10-16
  Filled 2023-08-29 – 2023-09-14 (×2): qty 60, 30d supply, fill #0

## 2023-08-29 NOTE — Telephone Encounter (Signed)
 [facility] CSW Progress Note  Covering CSW received referral for patient for SDOH concerns. Primary CSW has connected patient to known available resources. Patient understands the [employer] continues to be a resource. CSW will inform Primary CSW of contact.  Maudie Sorrow Clinical Social Worker [facility]

## 2023-08-29 NOTE — Telephone Encounter (Signed)
 Patient overdue for appointment, called to schedule. No answer and voicemail full.   Wilmer Berryhill, BSN, RN

## 2023-08-29 NOTE — Telephone Encounter (Signed)
 Patient last seen by ID inpatient 12/2022 and continued on Biktarvy . He was also given Bactrim  due to his chemotherapy. He's still being seen by onc, but has not followed up with ID since his last visit here 05/2022. We've been unable to get in touch with him the last few months. I can send in a refill of Biktarvy , but it looks like he hasn't gotten the Bactrim  since September. Do you want me to send that in as well?

## 2023-08-30 ENCOUNTER — Other Ambulatory Visit (HOSPITAL_BASED_OUTPATIENT_CLINIC_OR_DEPARTMENT_OTHER): Payer: Self-pay

## 2023-08-30 ENCOUNTER — Ambulatory Visit
Admission: RE | Admit: 2023-08-30 | Discharge: 2023-08-30 | Disposition: A | Discharge status: Home / Self Care | Source: Ambulatory Visit | Attending: Radiation Oncology | Admitting: Radiation Oncology

## 2023-08-30 DIAGNOSIS — C7B8 Other secondary neuroendocrine tumors: Secondary | ICD-10-CM | POA: Diagnosis not present

## 2023-08-30 DIAGNOSIS — C7A1 Malignant poorly differentiated neuroendocrine tumors: Secondary | ICD-10-CM | POA: Insufficient documentation

## 2023-08-30 DIAGNOSIS — C3412 Malignant neoplasm of upper lobe, left bronchus or lung: Secondary | ICD-10-CM | POA: Diagnosis not present

## 2023-08-31 NOTE — Progress Notes (Signed)
 The 10-year ASCVD risk score (Arnett DK, et al., 2019) is: 6.4%   Values used to calculate the score:     Age: 43 years     Sex: Male     Is Non-Hispanic African American: Yes     Diabetic: No     Tobacco smoker: No     Systolic Blood Pressure: 125 mmHg     Is BP treated: Yes     HDL Cholesterol: 47 mg/dL     Total Cholesterol: 171 mg/dL  No current statin therapy, no upcoming appointment.   Lovette Merta, BSN, RN

## 2023-09-02 DIAGNOSIS — B451 Cerebral cryptococcosis: Secondary | ICD-10-CM | POA: Diagnosis not present

## 2023-09-02 DIAGNOSIS — J9859 Other diseases of mediastinum, not elsewhere classified: Secondary | ICD-10-CM | POA: Diagnosis not present

## 2023-09-02 DIAGNOSIS — C3492 Malignant neoplasm of unspecified part of left bronchus or lung: Secondary | ICD-10-CM | POA: Diagnosis not present

## 2023-09-07 DIAGNOSIS — C7A1 Malignant poorly differentiated neuroendocrine tumors: Secondary | ICD-10-CM | POA: Diagnosis not present

## 2023-09-07 DIAGNOSIS — C7B8 Other secondary neuroendocrine tumors: Secondary | ICD-10-CM | POA: Diagnosis not present

## 2023-09-07 DIAGNOSIS — C3412 Malignant neoplasm of upper lobe, left bronchus or lung: Secondary | ICD-10-CM | POA: Diagnosis not present

## 2023-09-11 ENCOUNTER — Other Ambulatory Visit: Payer: Self-pay

## 2023-09-11 ENCOUNTER — Ambulatory Visit
Admission: RE | Admit: 2023-09-11 | Discharge: 2023-09-11 | Disposition: A | Discharge status: Home / Self Care | Source: Ambulatory Visit | Attending: Radiation Oncology | Admitting: Radiation Oncology

## 2023-09-11 DIAGNOSIS — C7A1 Malignant poorly differentiated neuroendocrine tumors: Secondary | ICD-10-CM | POA: Diagnosis present

## 2023-09-11 LAB — RAD ONC ARIA SESSION SUMMARY
Course Elapsed Days: 0
Plan Fractions Treated to Date: 1
Plan Fractions Treated to Date: 1
Plan Prescribed Dose Per Fraction: 18 Gy
Plan Prescribed Dose Per Fraction: 18 Gy
Plan Total Fractions Prescribed: 3
Plan Total Fractions Prescribed: 3
Plan Total Prescribed Dose: 54 Gy
Plan Total Prescribed Dose: 54 Gy
Reference Point Dosage Given to Date: 18 Gy
Reference Point Dosage Given to Date: 18 Gy
Reference Point Session Dosage Given: 18 Gy
Reference Point Session Dosage Given: 18 Gy
Session Number: 1

## 2023-09-12 ENCOUNTER — Encounter: Payer: Self-pay | Admitting: *Deleted

## 2023-09-12 ENCOUNTER — Inpatient Hospital Stay

## 2023-09-12 ENCOUNTER — Telehealth: Payer: Self-pay | Admitting: *Deleted

## 2023-09-12 ENCOUNTER — Encounter: Payer: Self-pay | Admitting: Medical Oncology

## 2023-09-12 ENCOUNTER — Other Ambulatory Visit: Payer: Self-pay | Admitting: Hematology & Oncology

## 2023-09-12 ENCOUNTER — Ambulatory Visit: Admitting: Radiation Oncology

## 2023-09-12 ENCOUNTER — Inpatient Hospital Stay: Attending: Hematology & Oncology

## 2023-09-12 ENCOUNTER — Inpatient Hospital Stay (HOSPITAL_BASED_OUTPATIENT_CLINIC_OR_DEPARTMENT_OTHER): Admitting: Medical Oncology

## 2023-09-12 ENCOUNTER — Other Ambulatory Visit (HOSPITAL_BASED_OUTPATIENT_CLINIC_OR_DEPARTMENT_OTHER): Payer: Self-pay

## 2023-09-12 VITALS — BP 104/91 | HR 108 | Temp 98.5°F | Resp 19 | Ht 73.0 in | Wt 165.4 lb

## 2023-09-12 VITALS — BP 115/79 | HR 92

## 2023-09-12 DIAGNOSIS — C7A1 Malignant poorly differentiated neuroendocrine tumors: Secondary | ICD-10-CM

## 2023-09-12 DIAGNOSIS — R059 Cough, unspecified: Secondary | ICD-10-CM

## 2023-09-12 DIAGNOSIS — Z5111 Encounter for antineoplastic chemotherapy: Secondary | ICD-10-CM | POA: Diagnosis present

## 2023-09-12 DIAGNOSIS — J9859 Other diseases of mediastinum, not elsewhere classified: Secondary | ICD-10-CM | POA: Diagnosis not present

## 2023-09-12 DIAGNOSIS — R0602 Shortness of breath: Secondary | ICD-10-CM

## 2023-09-12 DIAGNOSIS — Z21 Asymptomatic human immunodeficiency virus [HIV] infection status: Secondary | ICD-10-CM | POA: Insufficient documentation

## 2023-09-12 DIAGNOSIS — E43 Unspecified severe protein-calorie malnutrition: Secondary | ICD-10-CM | POA: Diagnosis not present

## 2023-09-12 DIAGNOSIS — C3492 Malignant neoplasm of unspecified part of left bronchus or lung: Secondary | ICD-10-CM

## 2023-09-12 DIAGNOSIS — Z5112 Encounter for antineoplastic immunotherapy: Secondary | ICD-10-CM | POA: Insufficient documentation

## 2023-09-12 LAB — CMP (CANCER CENTER ONLY)
ALT: 17 U/L (ref 0–44)
AST: 18 U/L (ref 15–41)
Albumin: 4.2 g/dL (ref 3.5–5.0)
Alkaline Phosphatase: 108 U/L (ref 38–126)
Anion gap: 9 (ref 5–15)
BUN: 9 mg/dL (ref 6–20)
CO2: 25 mmol/L (ref 22–32)
Calcium: 9.3 mg/dL (ref 8.9–10.3)
Chloride: 104 mmol/L (ref 98–111)
Creatinine: 0.95 mg/dL (ref 0.61–1.24)
GFR, Estimated: >60 mL/min (ref >60)
Glucose, Bld: 114 mg/dL — ABNORMAL HIGH (ref 70–99)
Potassium: 3.8 mmol/L (ref 3.5–5.1)
Sodium: 138 mmol/L (ref 135–145)
Total Bilirubin: 0.5 mg/dL (ref 0.0–1.2)
Total Protein: 7.9 g/dL (ref 6.5–8.1)

## 2023-09-12 LAB — CBC WITH DIFFERENTIAL (CANCER CENTER ONLY)
Abs Immature Granulocytes: 0.04 10*3/uL (ref 0.00–0.07)
Basophils Absolute: 0 10*3/uL (ref 0.0–0.1)
Basophils Relative: 0 %
Eosinophils Absolute: 0.4 10*3/uL (ref 0.0–0.5)
Eosinophils Relative: 4 %
HCT: 26.2 % — ABNORMAL LOW (ref 39.0–52.0)
Hemoglobin: 9.3 g/dL — ABNORMAL LOW (ref 13.0–17.0)
Immature Granulocytes: 0 %
Lymphocytes Relative: 12 %
Lymphs Abs: 1.1 10*3/uL (ref 0.7–4.0)
MCH: 35.1 pg — ABNORMAL HIGH (ref 26.0–34.0)
MCHC: 35.5 g/dL (ref 30.0–36.0)
MCV: 98.9 fL (ref 80.0–100.0)
Monocytes Absolute: 1.3 10*3/uL — ABNORMAL HIGH (ref 0.1–1.0)
Monocytes Relative: 15 %
Neutro Abs: 6.2 10*3/uL (ref 1.7–7.7)
Neutrophils Relative %: 69 %
Platelet Count: 163 10*3/uL (ref 150–400)
RBC: 2.65 MIL/uL — ABNORMAL LOW (ref 4.22–5.81)
RDW: 17.2 % — ABNORMAL HIGH (ref 11.5–15.5)
WBC Count: 9 10*3/uL (ref 4.0–10.5)
nRBC: 0 % (ref 0.0–0.2)

## 2023-09-12 LAB — PREALBUMIN: Prealbumin: 20 mg/dL (ref 18–38)

## 2023-09-12 LAB — TOTAL PROTEIN, URINE DIPSTICK: Protein, ur: NEGATIVE mg/dL

## 2023-09-12 LAB — LACTATE DEHYDROGENASE: LDH: 444 U/L — ABNORMAL HIGH (ref 98–192)

## 2023-09-12 MED ORDER — SODIUM CHLORIDE 0.9 % IV SOLN
15.0000 mg/kg | Freq: Once | INTRAVENOUS | Status: AC
  Administered 2023-09-12: 1200 mg via INTRAVENOUS
  Filled 2023-09-12: qty 48

## 2023-09-12 MED ORDER — PROCHLORPERAZINE MALEATE 10 MG PO TABS
10.0000 mg | ORAL_TABLET | Freq: Once | ORAL | Status: AC
  Administered 2023-09-12: 10 mg via ORAL
  Filled 2023-09-12: qty 1

## 2023-09-12 MED ORDER — SODIUM CHLORIDE 0.9 % IV SOLN: INTRAVENOUS | Status: DC

## 2023-09-12 MED ORDER — SODIUM CHLORIDE 0.9 % IV SOLN
500.0000 mg/m2 | Freq: Once | INTRAVENOUS | Status: AC
  Administered 2023-09-12: 1000 mg via INTRAVENOUS
  Filled 2023-09-12: qty 40

## 2023-09-12 MED ORDER — OXYCODONE HCL 5 MG PO TABS
5.0000 mg | ORAL_TABLET | Freq: Four times a day (QID) | ORAL | 0 refills | Status: DC | PRN
  Filled 2023-09-12: qty 60, 15d supply, fill #0

## 2023-09-12 MED ORDER — SODIUM CHLORIDE 0.9% FLUSH
10.0000 mL | INTRAVENOUS | Status: DC | PRN
  Administered 2023-09-12: 10 mL

## 2023-09-12 MED ORDER — HEPARIN SOD (PORK) LOCK FLUSH 100 UNIT/ML IV SOLN
500.0000 [IU] | Freq: Once | INTRAVENOUS | Status: AC | PRN
  Administered 2023-09-12: 500 [IU]

## 2023-09-12 NOTE — Progress Notes (Signed)
 Hematology and Oncology Follow Up Visit  Christopher Henson [id number] May 26, 1980 43 y.o. 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    Today he reports that he is fair. He feels like he may need another thoracentesis and paracentesis. He has slowly worsening SOB, cough when he rests on his left side, and some fullness without pain of his abdomen. No hemoptysis or chest pain.   Most recent CT scan was done on 08/02/2023.  It showed a mixed response.  He had some resolution of a nodule in the left lung.  However, there were 2 new nodules that were subpleural.  One measured 1.3 cm and the other measured 0.9 cm. He had decrease in the mediastinal hilar lymph nodes.  He had decrease in his left adrenal met. He is now undergoing radio therapy of this area.   He is taking Marinol  and a Megace  elixir to try to help with his appetite.  He has had no fever.  There has been no obvious bleeding.  He has had no problems with respect to the HIV.  He has had no mouth sores.  He has had no headache.  Overall, I would say that his performance status is probably ECOG 2.    Wt Readings from Last 3 Encounters:  09/12/23 165 lb 6.4 oz (75 kg)  08/29/23 172 lb (78 kg)  08/22/23 171 lb (77.6 kg)   Medications:  Current Outpatient Medications:    acetaminophen  (TYLENOL ) 325 MG tablet, Take 2 tablets (650 mg total) by mouth every 6 (six) hours as needed., Disp: , Rfl:    BIKTARVY  50-200-25 MG TABS tablet, TAKE 1 TABLET BY MOUTH DAILY, Disp: 30 tablet, Rfl: 0   lidocaine -prilocaine  (EMLA ) cream,  Apply to affected area once, Disp: 30 g, Rfl: 3   oxyCODONE  (OXY IR/ROXICODONE ) 5 MG immediate release tablet, Take 1 tablet (5 mg total) by mouth every 6 (six) hours as needed., Disp: 60 tablet, Rfl: 0   oxyCODONE  (OXYCONTIN ) 15 mg 12 hr tablet, Take 1 tablet (15 mg total) by mouth every 12 (twelve) hours., Disp: 60 tablet, Rfl: 0   dexamethasone  (DECADRON ) 4 MG tablet, Take 1 tablet (4 mg total) by mouth 2 (two) times daily. Start day before each pemetrexed  dose and continue for 3 days.Take with food. (Patient not taking: Reported on 09/12/2023), Disp: 6 tablet, Rfl: 5   dronabinol  (MARINOL ) 5 MG capsule, Take 1 capsule (5 mg total) by mouth 2 (two) times daily before a meal. (Patient not taking: Reported on 09/12/2023), Disp: 60 capsule, Rfl: 0   folic acid  (FOLVITE ) 1 MG tablet, Take 1 tablet (1 mg total) by mouth daily. Start 7 days before pemetrexed  chemotherapy. Continue until 21 days after pemetrexed  completed. (Patient not taking: Reported on 09/12/2023), Disp: 100 tablet, Rfl: 3   megestrol  (MEGACE  ES) 625 MG/5ML suspension, Take 5 mLs (625 mg total) by mouth daily. (Patient not taking: Reported on 09/12/2023), Disp: 150 mL, Rfl: 4   megestrol  (MEGACE ) 400 MG/10ML suspension, Take 10 mLs (400 mg total) by mouth 2 (two) times daily. (Patient not  taking: Reported on 09/12/2023), Disp: 240 mL, Rfl: 1   ondansetron  (ZOFRAN ) 8 MG tablet, Take 1 tablet (8 mg total) by mouth every 8 (eight) hours as needed for nausea or vomiting. (Patient not taking: Reported on 08/01/2023), Disp: 30 tablet, Rfl: 1   prochlorperazine  (COMPAZINE ) 10 MG tablet, Take 1 tablet (10 mg total) by mouth every 6 (six) hours as needed for nausea or vomiting. (Patient not taking: Reported on 09/12/2023), Disp: 30 tablet, Rfl: 1   senna-docusate (SENOKOT-S) 8.6-50 MG tablet, Take 1 tablet by mouth 2 (two) times daily between meals as needed for mild constipation. (Patient not taking: Reported on 04/12/2023), Disp: , Rfl:    sucralfate  (CARAFATE )  1 g tablet, Take 1 tablet (1 g total) by mouth 4 (four) times daily -  with meals and at bedtime. Crush and dissolve in 10 mL's of warm water prior to swallowing, take 20 min prior to meals (Patient not taking: Reported on 07/11/2023), Disp: 120 tablet, Rfl: 0   telmisartan  (MICARDIS ) 40 MG tablet, Take 1 tablet (40 mg total) by mouth daily. (Patient not taking: Reported on 09/12/2023), Disp: 30 tablet, Rfl: 6  Allergies: No Known Allergies  Past Medical History, Surgical history, Social history, and Family History were reviewed and updated.  Review of Systems: Review of Systems  Constitutional: Negative.   HENT:  Negative.    Eyes: Negative.   Respiratory:  Positive for cough and shortness of breath.   Cardiovascular:  Negative for chest pain.  Gastrointestinal: Negative.   Endocrine: Negative.   Genitourinary: Negative.    Musculoskeletal: Negative.   Skin: Negative.   Neurological: Negative.   Hematological: Negative.   Psychiatric/Behavioral: Negative.      Physical Exam: Vitals:   09/12/23 0943  BP: (!) 104/91  Pulse: (!) 108  Resp: 19  Temp: 98.5 F (36.9 C)  SpO2: 97%     Wt Readings from Last 3 Encounters:  09/12/23 165 lb 6.4 oz (75 kg)  08/29/23 172 lb (78 kg)  08/22/23 171 lb (77.6 kg)    Physical Exam Vitals reviewed.  HENT:     Head: Normocephalic and atraumatic.  Eyes:     Pupils: Pupils are equal, round, and reactive to light.  Cardiovascular:     Rate and Rhythm: Normal rate and regular rhythm.     Heart sounds: Normal heart sounds.     Comments: Cardiac exam is tachycardic but regular.  There are no murmurs. Pulmonary:     Effort: Pulmonary effort is normal.     Breath sounds: Normal breath sounds.     Comments: His lungs sound relatively clear bilaterally.  Maybe a little bit decreased and breath sounds are on the left lower lung. Abdominal:     General: Bowel sounds are normal.     Palpations: Abdomen is soft.     Comments: Abdominal exam is  soft.  He has good bowel sounds.  There is no fluid wave but his abdomen feels mild to moderately distended. There is no palpable liver or spleen tip.  Musculoskeletal:        General: No tenderness or deformity. Normal range of motion.     Cervical back: Normal range of motion.  Lymphadenopathy:     Cervical: No cervical adenopathy.  Skin:    General: Skin is warm and dry.     Findings: No erythema or rash.  Neurological:     Mental Status: He is alert and oriented to person, place, and time.  Psychiatric:  Behavior: Behavior normal.        Thought Content: Thought content normal.        Judgment: Judgment normal.      Lab Results  Component Value Date   WBC 9.0 09/12/2023   HGB 9.3 (L) 09/12/2023   HCT 26.2 (L) 09/12/2023   MCV 98.9 09/12/2023   PLT 163 09/12/2023     Chemistry      Component Value Date/Time   NA 138 09/12/2023 0910   NA 143 02/25/2019 1041   K 3.8 09/12/2023 0910   CL 104 09/12/2023 0910   CO2 25 09/12/2023 0910   BUN 9 09/12/2023 0910   BUN 8 02/25/2019 1041   CREATININE 0.95 09/12/2023 0910   CREATININE 0.89 12/14/2022 1132      Component Value Date/Time   CALCIUM  9.3 09/12/2023 0910   ALKPHOS 108 09/12/2023 0910   AST 18 09/12/2023 0910   ALT 17 09/12/2023 0910   BILITOT 0.5 09/12/2023 0910     Encounter Diagnoses  Name Primary?   Large cell neuroendocrine carcinoma (HCC) Yes   Protein-calorie malnutrition, severe    Small cell lung cancer, left (HCC)     Impression and Plan: Mr. Penson is a very nice 43 year old African-American male. He has what certainly is acting like a large cell neuroendocrine carcinoma. He is currently getting SBRT (s/p 1 of 3)as well as Vegzelma .   CBC and CMP reviewed today and are ok for treatment Chest x ray for evaluation to see if he would likely benefit from a thoracentesis. If no fluid is seen he may benefit from CTA. D-dimer not likely useful given high probability of positive result due to medical  conditions and treatment.   TX today Chest x ray order placed RTC 3 weeks MD, port labs(see future orders in system), TX    Alonza Arthurs Currie, PA-C 6/4/202510:04 AM

## 2023-09-12 NOTE — Patient Instructions (Signed)
 [facility] - A DEPT OF [facility]  Discharge Instructions: Thank you for choosing [name] to provide your oncology and hematology care.   If you have a lab appointment with the Cancer Center, please go directly to the Cancer Center and check in at the registration area.  Wear comfortable clothing and clothing appropriate for easy access to any Portacath or PICC line.   We strive to give you quality time with your provider. You may need to reschedule your appointment if you arrive late (15 or more minutes).  Arriving late affects you and other patients whose appointments are after yours.  Also, if you miss three or more appointments without notifying the office, you may be dismissed from the clinic at the provider's discretion.      For prescription refill requests, have your pharmacy contact our office and allow 72 hours for refills to be completed.    Today you received the following chemotherapy and/or immunotherapy agents Avastin /Alimta      To help prevent nausea and vomiting after your treatment, we encourage you to take your nausea medication as directed.  BELOW ARE SYMPTOMS THAT SHOULD BE REPORTED IMMEDIATELY: *FEVER GREATER THAN 100.4 F (38 C) OR HIGHER *CHILLS OR SWEATING *NAUSEA AND VOMITING THAT IS NOT CONTROLLED WITH YOUR NAUSEA MEDICATION *UNUSUAL SHORTNESS OF BREATH *UNUSUAL BRUISING OR BLEEDING *URINARY PROBLEMS (pain or burning when urinating, or frequent urination) *BOWEL PROBLEMS (unusual diarrhea, constipation, pain near the anus) TENDERNESS IN MOUTH AND THROAT WITH OR WITHOUT PRESENCE OF ULCERS (sore throat, sores in mouth, or a toothache) UNUSUAL RASH, SWELLING OR PAIN  UNUSUAL VAGINAL DISCHARGE OR ITCHING   Items with * indicate a potential emergency and should be followed up as soon as possible or go to the Emergency Department if any problems should occur.  Please show the CHEMOTHERAPY ALERT CARD or  IMMUNOTHERAPY ALERT CARD at check-in to the Emergency Department and triage nurse. Should you have questions after your visit or need to cancel or reschedule your appointment, please contact [facility] - A DEPT OF [facility]  [phone] and follow the prompts.  Office hours are 8:00 a.m. to 4:30 p.m. Monday - Friday. Please note that voicemails left after 4:00 p.m. may not be returned until the following business day.  We are closed weekends and major holidays. You have access to a nurse at all times for urgent questions. Please call the main number to the clinic [phone] and follow the prompts.  For any non-urgent questions, you may also contact your provider using MyChart. We now offer e-Visits for anyone 86 and older to request care online for non-urgent symptoms. For details visit [email or web address].   Also download the MyChart app! Go to the app store, search "MyChart", open the app, select [place], and log in with your MyChart username and password.

## 2023-09-12 NOTE — Patient Instructions (Signed)

## 2023-09-12 NOTE — Telephone Encounter (Signed)
 Per Sunnie England, PA, I called and left a message that she has ordered a chest X-ray for today or tomorrow, downstairs at [facility], first floor. You don't have to have an appointment. If you have any concerns or questions, please call the office.

## 2023-09-12 NOTE — Progress Notes (Signed)
 Patient is receiving SBRT. He's received one of three treatments. He will proceed with cycle four of systemic treatment today.   Oncology Nurse Navigator Documentation     09/12/2023   10:00 AM  Oncology Nurse Navigator Flowsheets  Phase of Treatment Radiation  Radiation Actual Start Date: 09/11/2023  Radiation Expected End Date: 09/17/2023  Navigator Follow Up Date: 10/03/2023  Navigator Follow Up Reason: Follow-up Appointment;Chemotherapy  Navigator Location [facility]  Navigator Encounter Type Appt/Treatment Plan Review  Patient Visit Type MedOnc  Treatment Phase Active Tx  Barriers/Navigation Needs No Barriers At This Time  Interventions None Required  Acuity Level 1-No Barriers  Time Spent with Patient 15

## 2023-09-13 ENCOUNTER — Ambulatory Visit
Admission: RE | Admit: 2023-09-13 | Discharge: 2023-09-13 | Disposition: A | Discharge status: Home / Self Care | Source: Ambulatory Visit | Attending: Radiation Oncology | Admitting: Radiation Oncology

## 2023-09-13 ENCOUNTER — Other Ambulatory Visit: Payer: Self-pay

## 2023-09-13 DIAGNOSIS — C7A1 Malignant poorly differentiated neuroendocrine tumors: Secondary | ICD-10-CM | POA: Diagnosis not present

## 2023-09-13 LAB — RAD ONC ARIA SESSION SUMMARY
Course Elapsed Days: 2
Plan Fractions Treated to Date: 2
Plan Fractions Treated to Date: 2
Plan Prescribed Dose Per Fraction: 18 Gy
Plan Prescribed Dose Per Fraction: 18 Gy
Plan Total Fractions Prescribed: 3
Plan Total Fractions Prescribed: 3
Plan Total Prescribed Dose: 54 Gy
Plan Total Prescribed Dose: 54 Gy
Reference Point Dosage Given to Date: 36 Gy
Reference Point Dosage Given to Date: 36 Gy
Reference Point Session Dosage Given: 18 Gy
Reference Point Session Dosage Given: 18 Gy
Session Number: 2

## 2023-09-14 ENCOUNTER — Other Ambulatory Visit (HOSPITAL_BASED_OUTPATIENT_CLINIC_OR_DEPARTMENT_OTHER): Payer: Self-pay

## 2023-09-14 ENCOUNTER — Ambulatory Visit: Admitting: Radiation Oncology

## 2023-09-14 ENCOUNTER — Other Ambulatory Visit: Payer: Self-pay

## 2023-09-17 ENCOUNTER — Ambulatory Visit: Admitting: Radiation Oncology

## 2023-09-17 ENCOUNTER — Ambulatory Visit

## 2023-09-18 ENCOUNTER — Other Ambulatory Visit: Payer: Self-pay

## 2023-09-18 ENCOUNTER — Telehealth: Payer: Self-pay | Admitting: Dietician

## 2023-09-18 ENCOUNTER — Ambulatory Visit
Admission: RE | Admit: 2023-09-18 | Discharge: 2023-09-18 | Disposition: A | Discharge status: Home / Self Care | Source: Ambulatory Visit | Attending: Radiation Oncology | Admitting: Radiation Oncology

## 2023-09-18 DIAGNOSIS — Z510 Encounter for antineoplastic radiation therapy: Secondary | ICD-10-CM | POA: Diagnosis not present

## 2023-09-18 DIAGNOSIS — C7B8 Other secondary neuroendocrine tumors: Secondary | ICD-10-CM | POA: Diagnosis not present

## 2023-09-18 DIAGNOSIS — C7A1 Malignant poorly differentiated neuroendocrine tumors: Secondary | ICD-10-CM

## 2023-09-18 DIAGNOSIS — C3412 Malignant neoplasm of upper lobe, left bronchus or lung: Secondary | ICD-10-CM | POA: Diagnosis not present

## 2023-09-18 LAB — RAD ONC ARIA SESSION SUMMARY

## 2023-09-18 NOTE — Telephone Encounter (Signed)
 Patient screened on MST. First attempt to reach since previous nutrition consults. Provided my cell# on voice mail to return call to set up a nutrition consult.  Carleen Chary, RDN, LDN Registered Dietitian, [facility] Part Time Remote (Usual office hours: Tuesday-Thursday) Cell: [phone]

## 2023-09-19 ENCOUNTER — Ambulatory Visit

## 2023-09-19 ENCOUNTER — Ambulatory Visit: Admitting: Radiation Oncology

## 2023-09-19 NOTE — Radiation Completion Notes (Addendum)
  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  Intent: Palliative     ==========DELIVERED PLANS==========  First Treatment Date: 2023-09-11 Last Treatment Date: 2023-09-18   Plan Name: Lung_LUL_SBRT Site: Lung, Left Technique: SBRT/SRT-IMRT Mode: Photon Dose Per Fraction: 18 Gy Prescribed Dose (Delivered / Prescribed): 54 Gy / 54 Gy Prescribed Fxs (Delivered / Prescribed): 3 / 3   Plan Name: Lung_LLL_SBRT Site: Lung, Left Technique: SBRT/SRT-IMRT Mode: Photon Dose Per Fraction: 18 Gy Prescribed Dose (Delivered / Prescribed): 54 Gy / 54 Gy Prescribed Fxs (Delivered / Prescribed): 3 / 3     ====================================   The patient tolerated radiation well and developed no significant acute effects.   The patient will return in one month and will continue follow up with Dr. Maria Shiner as well.      Amiel Kalata, PA-C

## 2023-09-20 ENCOUNTER — Other Ambulatory Visit: Payer: Self-pay

## 2023-09-20 DIAGNOSIS — B20 Human immunodeficiency virus [HIV] disease: Secondary | ICD-10-CM

## 2023-09-20 MED ORDER — BIKTARVY 50-200-25 MG PO TABS: 1.0000 | ORAL_TABLET | Freq: Every day | ORAL | 0 refills | Status: DC

## 2023-09-21 ENCOUNTER — Ambulatory Visit: Admitting: Radiation Oncology

## 2023-09-21 ENCOUNTER — Encounter: Payer: Self-pay | Admitting: Hematology & Oncology

## 2023-09-22 ENCOUNTER — Other Ambulatory Visit: Payer: Self-pay

## 2023-10-03 ENCOUNTER — Inpatient Hospital Stay (HOSPITAL_BASED_OUTPATIENT_CLINIC_OR_DEPARTMENT_OTHER): Admitting: Hematology & Oncology

## 2023-10-03 ENCOUNTER — Inpatient Hospital Stay

## 2023-10-03 ENCOUNTER — Encounter: Payer: Self-pay | Admitting: *Deleted

## 2023-10-03 ENCOUNTER — Inpatient Hospital Stay: Admitting: Licensed Clinical Social Worker

## 2023-10-03 ENCOUNTER — Encounter: Payer: Self-pay | Admitting: Hematology & Oncology

## 2023-10-03 VITALS — BP 118/86 | HR 80

## 2023-10-03 VITALS — BP 111/79 | HR 92 | Temp 97.8°F | Resp 18 | Ht 73.0 in | Wt 164.0 lb

## 2023-10-03 DIAGNOSIS — J9859 Other diseases of mediastinum, not elsewhere classified: Secondary | ICD-10-CM | POA: Diagnosis not present

## 2023-10-03 DIAGNOSIS — C7A1 Malignant poorly differentiated neuroendocrine tumors: Secondary | ICD-10-CM

## 2023-10-03 DIAGNOSIS — H5213 Myopia, bilateral: Secondary | ICD-10-CM | POA: Diagnosis not present

## 2023-10-03 DIAGNOSIS — E43 Unspecified severe protein-calorie malnutrition: Secondary | ICD-10-CM

## 2023-10-03 DIAGNOSIS — Z5112 Encounter for antineoplastic immunotherapy: Secondary | ICD-10-CM | POA: Diagnosis not present

## 2023-10-03 DIAGNOSIS — C3492 Malignant neoplasm of unspecified part of left bronchus or lung: Secondary | ICD-10-CM | POA: Diagnosis not present

## 2023-10-03 DIAGNOSIS — B451 Cerebral cryptococcosis: Secondary | ICD-10-CM | POA: Diagnosis not present

## 2023-10-03 LAB — CBC WITH DIFFERENTIAL (CANCER CENTER ONLY)
Abs Immature Granulocytes: 0.02 10*3/uL (ref 0.00–0.07)
Basophils Absolute: 0.1 10*3/uL (ref 0.0–0.1)
Basophils Relative: 1 %
Eosinophils Absolute: 0.3 10*3/uL (ref 0.0–0.5)
Eosinophils Relative: 7 %
HCT: 22.8 % — ABNORMAL LOW (ref 39.0–52.0)
Hemoglobin: 7.5 g/dL — ABNORMAL LOW (ref 13.0–17.0)
Immature Granulocytes: 1 %
Lymphocytes Relative: 23 %
Lymphs Abs: 1 10*3/uL (ref 0.7–4.0)
MCH: 33.5 pg (ref 26.0–34.0)
MCHC: 32.9 g/dL (ref 30.0–36.0)
MCV: 101.8 fL — ABNORMAL HIGH (ref 80.0–100.0)
Monocytes Absolute: 0.8 10*3/uL (ref 0.1–1.0)
Monocytes Relative: 18 %
Neutro Abs: 2.2 10*3/uL (ref 1.7–7.7)
Neutrophils Relative %: 50 %
Platelet Count: 147 10*3/uL — ABNORMAL LOW (ref 150–400)
RBC: 2.24 MIL/uL — ABNORMAL LOW (ref 4.22–5.81)
RDW: 18.1 % — ABNORMAL HIGH (ref 11.5–15.5)
WBC Count: 4.3 10*3/uL (ref 4.0–10.5)
nRBC: 0 % (ref 0.0–0.2)

## 2023-10-03 LAB — CMP (CANCER CENTER ONLY)
ALT: 18 U/L (ref 0–44)
AST: 17 U/L (ref 15–41)
Albumin: 3.7 g/dL (ref 3.5–5.0)
Alkaline Phosphatase: 80 U/L (ref 38–126)
Anion gap: 8 (ref 5–15)
BUN: 10 mg/dL (ref 6–20)
CO2: 25 mmol/L (ref 22–32)
Calcium: 8.9 mg/dL (ref 8.9–10.3)
Chloride: 106 mmol/L (ref 98–111)
Creatinine: 0.83 mg/dL (ref 0.61–1.24)
GFR, Estimated: >60 mL/min (ref >60)
Glucose, Bld: 115 mg/dL — ABNORMAL HIGH (ref 70–99)
Potassium: 3.8 mmol/L (ref 3.5–5.1)
Sodium: 139 mmol/L (ref 135–145)
Total Bilirubin: 0.4 mg/dL (ref 0.0–1.2)
Total Protein: 6.6 g/dL (ref 6.5–8.1)

## 2023-10-03 LAB — FOLATE: Folate: 5.6 ng/mL — ABNORMAL LOW (ref >5.9)

## 2023-10-03 LAB — VITAMIN B12: Vitamin B-12: 347 pg/mL (ref 180–914)

## 2023-10-03 LAB — IRON AND IRON BINDING CAPACITY (CC-WL,HP ONLY)
Iron: 102 ug/dL (ref 45–182)
Saturation Ratios: 36 % (ref 17.9–39.5)
TIBC: 286 ug/dL (ref 250–450)
UIBC: 184 ug/dL (ref 117–376)

## 2023-10-03 LAB — FERRITIN: Ferritin: 1938 ng/mL — ABNORMAL HIGH (ref 24–336)

## 2023-10-03 MED ORDER — SODIUM CHLORIDE 0.9 % IV SOLN: INTRAVENOUS | Status: DC

## 2023-10-03 MED ORDER — PROCHLORPERAZINE MALEATE 10 MG PO TABS
10.0000 mg | ORAL_TABLET | Freq: Once | ORAL | Status: AC
  Administered 2023-10-03: 10 mg via ORAL
  Filled 2023-10-03: qty 1

## 2023-10-03 MED ORDER — SODIUM CHLORIDE 0.9% FLUSH
10.0000 mL | INTRAVENOUS | Status: DC | PRN
  Administered 2023-10-03: 10 mL

## 2023-10-03 MED ORDER — HEPARIN SOD (PORK) LOCK FLUSH 100 UNIT/ML IV SOLN
500.0000 [IU] | Freq: Once | INTRAVENOUS | Status: AC | PRN
  Administered 2023-10-03: 500 [IU]

## 2023-10-03 MED ORDER — SODIUM CHLORIDE 0.9 % IV SOLN
500.0000 mg/m2 | Freq: Once | INTRAVENOUS | Status: AC
  Administered 2023-10-03: 1000 mg via INTRAVENOUS
  Filled 2023-10-03: qty 40

## 2023-10-03 MED ORDER — SODIUM CHLORIDE 0.9 % IV SOLN
15.0000 mg/kg | Freq: Once | INTRAVENOUS | Status: AC
  Administered 2023-10-03: 1200 mg via INTRAVENOUS
  Filled 2023-10-03: qty 48

## 2023-10-03 NOTE — Progress Notes (Signed)
 [facility] CSW Progress Note  Visual merchandiser met with patient to follow-up on food insecurity and housing needs.  Patient stated he is still living with his sister, but is seeking other options.    Interventions: Referred patient to community resources: low income housing options.   Also provided a bag of food from the pantry.      Follow Up Plan:  CSW will follow-up with patient by phone     Christopher CHRISTELLA Au, LCSW Clinical Social Worker [facility]    Patient is participating in a Managed Medicaid Plan:  Yes

## 2023-10-03 NOTE — Progress Notes (Signed)
 Ok to treat with Hbg 7.5 per Dr. Timmy

## 2023-10-03 NOTE — Progress Notes (Signed)
 Hematology and Oncology Follow Up Visit  Christopher Henson [id number] 21-Jul-1980 43 y.o. 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  He underwent his [facility] radiosurgery for the lung lung and adrenal lesion.  He completed this back on 09/18/2023.  He seems to be managing okay.  He has had no problems with nausea or vomiting.  His weight is down a little bit.  He does complain a little bit of pain and congestion in the right eye.  Exam unsure exactly what this might be.  He says there is no discharge from the right eye.  He has had no problems with bleeding.  He has had no obvious change in bowel or bladder habits.    He did have a chest tube taken out and so far there is been no recurrence of a pleural effusion.  He has had no mouth sores.  He has had no rashes.  There may be a little bit of neuropathy in the hands and feet.  I think his last CT scan was done back in April.  Overall, I would have said that his performance status probably ECOG 1.     Wt Readings from Last 3 Encounters:  10/03/23 164 lb (74.4 kg)  09/12/23 165 lb 6.4 oz (75 kg)  08/29/23 172 lb (78 kg)   Medications:  Current Outpatient Medications:    acetaminophen  (TYLENOL ) 325 MG tablet, Take 2 tablets (650 mg total) by mouth every 6 (six) hours as needed., Disp: , Rfl:    bictegravir-emtricitabine -tenofovir  AF (BIKTARVY ) 50-200-25 MG TABS tablet, Take 1 tablet by mouth daily., Disp: 30 tablet, Rfl: 0   oxyCODONE  (OXY IR/ROXICODONE ) 5 MG immediate release tablet, Take 1  tablet (5 mg total) by mouth every 6 (six) hours as needed., Disp: 60 tablet, Rfl: 0   oxyCODONE  (OXYCONTIN ) 15 mg 12 hr tablet, Take 1 tablet (15 mg total) by mouth every 12 (twelve) hours., Disp: 60 tablet, Rfl: 0   dexamethasone  (DECADRON ) 4 MG tablet, Take 1 tablet (4 mg total) by mouth 2 (two) times daily. Start day before each pemetrexed  dose and continue for 3 days.Take with food. (Patient not taking: Reported on 10/03/2023), Disp: 6 tablet, Rfl: 5   dronabinol  (MARINOL ) 5 MG capsule, Take 1 capsule (5 mg total) by mouth 2 (two) times daily before a meal. (Patient not taking: Reported on 10/03/2023), Disp: 60 capsule, Rfl: 0   folic acid  (FOLVITE ) 1 MG tablet, Take 1 tablet (1 mg total) by mouth daily. Start 7 days before pemetrexed  chemotherapy. Continue until 21 days after pemetrexed  completed. (Patient not taking: Reported on 10/03/2023), Disp: 100 tablet, Rfl: 3   lidocaine -prilocaine  (EMLA ) cream, Apply to affected area once (Patient not taking: Reported on 10/03/2023), Disp: 30 g, Rfl: 3   megestrol  (MEGACE  ES) 625 MG/5ML suspension, Take 5 mLs (625 mg total) by mouth daily. (Patient not taking: Reported on 10/03/2023), Disp: 150 mL, Rfl: 4   megestrol  (MEGACE ) 400 MG/10ML suspension, Take 10 mLs (400 mg total) by mouth 2 (two) times daily. (Patient not taking: Reported on  10/03/2023), Disp: 240 mL, Rfl: 1   ondansetron  (ZOFRAN ) 8 MG tablet, Take 1 tablet (8 mg total) by mouth every 8 (eight) hours as needed for nausea or vomiting. (Patient not taking: Reported on 10/03/2023), Disp: 30 tablet, Rfl: 1   prochlorperazine  (COMPAZINE ) 10 MG tablet, Take 1 tablet (10 mg total) by mouth every 6 (six) hours as needed for nausea or vomiting. (Patient not taking: Reported on 10/03/2023), Disp: 30 tablet, Rfl: 1   senna-docusate (SENOKOT-S) 8.6-50 MG tablet, Take 1 tablet by mouth 2 (two) times daily between meals as needed for mild constipation. (Patient not taking: Reported on 10/03/2023), Disp: , Rfl:     sucralfate  (CARAFATE ) 1 g tablet, Take 1 tablet (1 g total) by mouth 4 (four) times daily -  with meals and at bedtime. Crush and dissolve in 10 mL's of warm water prior to swallowing, take 20 min prior to meals (Patient not taking: Reported on 10/03/2023), Disp: 120 tablet, Rfl: 0   telmisartan  (MICARDIS ) 40 MG tablet, Take 1 tablet (40 mg total) by mouth daily. (Patient not taking: Reported on 10/03/2023), Disp: 30 tablet, Rfl: 6  Allergies: No Known Allergies  Past Medical History, Surgical history, Social history, and Family History were reviewed and updated.  Review of Systems: Review of Systems  Constitutional: Negative.   HENT:  Negative.    Eyes: Negative.   Respiratory:  Positive for cough and shortness of breath.   Cardiovascular:  Negative for chest pain.  Gastrointestinal: Negative.   Endocrine: Negative.   Genitourinary: Negative.    Musculoskeletal: Negative.   Skin: Negative.   Neurological: Negative.   Hematological: Negative.   Psychiatric/Behavioral: Negative.      Physical Exam: Vitals:   10/03/23 0838  BP: 111/79  Pulse: 92  Resp: 18  Temp: 97.8 F (36.6 C)  SpO2: 99%     Wt Readings from Last 3 Encounters:  10/03/23 164 lb (74.4 kg)  09/12/23 165 lb 6.4 oz (75 kg)  08/29/23 172 lb (78 kg)    Physical Exam Vitals reviewed.  HENT:     Head: Normocephalic and atraumatic.   Eyes:     Pupils: Pupils are equal, round, and reactive to light.    Cardiovascular:     Rate and Rhythm: Normal rate and regular rhythm.     Heart sounds: Normal heart sounds.     Comments: Cardiac exam is tachycardic but regular.  There are no murmurs. Pulmonary:     Effort: Pulmonary effort is normal.     Breath sounds: Normal breath sounds.     Comments: His lungs sound relatively clear bilaterally.  Maybe a little bit decreased and breath sounds are on the left lower lung. Abdominal:     General: Bowel sounds are normal.     Palpations: Abdomen is soft.      Comments: Abdominal exam is soft.  He has good bowel sounds.  There is no fluid wave but his abdomen feels mild to moderately distended. There is no palpable liver or spleen tip.   Musculoskeletal:        General: No tenderness or deformity. Normal range of motion.     Cervical back: Normal range of motion.  Lymphadenopathy:     Cervical: No cervical adenopathy.   Skin:    General: Skin is warm and dry.     Findings: No erythema or rash.   Neurological:     Mental Status: He is alert and oriented to person, place, and time.  Psychiatric:        Behavior: Behavior normal.        Thought Content: Thought content normal.        Judgment: Judgment normal.     Lab Results  Component Value Date   WBC 4.3 10/03/2023   HGB 7.5 (L) 10/03/2023   HCT 22.8 (L) 10/03/2023   MCV 101.8 (H) 10/03/2023   PLT 147 (L) 10/03/2023     Chemistry      Component Value Date/Time   NA 138 09/12/2023 0910   NA 143 02/25/2019 1041   K 3.8 09/12/2023 0910   CL 104 09/12/2023 0910   CO2 25 09/12/2023 0910   BUN 9 09/12/2023 0910   BUN 8 02/25/2019 1041   CREATININE 0.95 09/12/2023 0910   CREATININE 0.89 12/14/2022 1132      Component Value Date/Time   CALCIUM  9.3 09/12/2023 0910   ALKPHOS 108 09/12/2023 0910   AST 18 09/12/2023 0910   ALT 17 09/12/2023 0910   BILITOT 0.5 09/12/2023 0910      Impression and Plan: Mr. Zenz is a very nice 43 year old African-American male. He has what certainly is acting like a large cell neuroendocrine carcinoma. He is currently getting SBRT (s/p 1 of 3)as well as chemotherapy with Alimta/Avastin .  Organ have to scan at some point.  I probably would like to try to hold off as long as I can do so that the effects of the radiosurgery will be felt.  For right now, we will go ahead with his 5th cycle of treatment.  Hopefully, he will gain some weight.  I told him that he really needs to take the Megace .  I know the Marinol  will also help.  He Dells needs to  be transfused.  I think this will certainly make him feel a little bit better.  This also may help him with his appetite and maybe trying to gain a little bit of weight.  I know he is trying hard.  I know this has been challenging for him.  He is showing a lot of toughness.  Will plan to get him back in 3 weeks.  Maude JONELLE Crease, MD 6/25/20258:59 AM

## 2023-10-03 NOTE — Patient Instructions (Signed)

## 2023-10-03 NOTE — Patient Instructions (Signed)
 [facility] - A DEPT OF [facility]  Discharge Instructions: Thank you for choosing [name] to provide your oncology and hematology care.   If you have a lab appointment with the Cancer Center, please go directly to the Cancer Center and check in at the registration area.  Wear comfortable clothing and clothing appropriate for easy access to any Portacath or PICC line.   We strive to give you quality time with your provider. You may need to reschedule your appointment if you arrive late (15 or more minutes).  Arriving late affects you and other patients whose appointments are after yours.  Also, if you miss three or more appointments without notifying the office, you may be dismissed from the clinic at the provider's discretion.      For prescription refill requests, have your pharmacy contact our office and allow 72 hours for refills to be completed.    Today you received the following chemotherapy and/or immunotherapy agents Avastin /Alimta      To help prevent nausea and vomiting after your treatment, we encourage you to take your nausea medication as directed.  BELOW ARE SYMPTOMS THAT SHOULD BE REPORTED IMMEDIATELY: *FEVER GREATER THAN 100.4 F (38 C) OR HIGHER *CHILLS OR SWEATING *NAUSEA AND VOMITING THAT IS NOT CONTROLLED WITH YOUR NAUSEA MEDICATION *UNUSUAL SHORTNESS OF BREATH *UNUSUAL BRUISING OR BLEEDING *URINARY PROBLEMS (pain or burning when urinating, or frequent urination) *BOWEL PROBLEMS (unusual diarrhea, constipation, pain near the anus) TENDERNESS IN MOUTH AND THROAT WITH OR WITHOUT PRESENCE OF ULCERS (sore throat, sores in mouth, or a toothache) UNUSUAL RASH, SWELLING OR PAIN  UNUSUAL VAGINAL DISCHARGE OR ITCHING   Items with * indicate a potential emergency and should be followed up as soon as possible or go to the Emergency Department if any problems should occur.  Please show the CHEMOTHERAPY ALERT CARD or  IMMUNOTHERAPY ALERT CARD at check-in to the Emergency Department and triage nurse. Should you have questions after your visit or need to cancel or reschedule your appointment, please contact [facility] - A DEPT OF [facility]  [phone] and follow the prompts.  Office hours are 8:00 a.m. to 4:30 p.m. Monday - Friday. Please note that voicemails left after 4:00 p.m. may not be returned until the following business day.  We are closed weekends and major holidays. You have access to a nurse at all times for urgent questions. Please call the main number to the clinic [phone] and follow the prompts.  For any non-urgent questions, you may also contact your provider using MyChart. We now offer e-Visits for anyone 86 and older to request care online for non-urgent symptoms. For details visit [email or web address].   Also download the MyChart app! Go to the app store, search "MyChart", open the app, select [place], and log in with your MyChart username and password.

## 2023-10-03 NOTE — Progress Notes (Signed)
 Patient will proceed with cycle five. Will return later this week for transfusion.   Oncology Nurse Navigator Documentation     10/03/2023   12:30 PM  Oncology Nurse Navigator Flowsheets  Navigator Follow Up Date: 10/24/2023  Navigator Follow Up Reason: Follow-up Appointment;Chemotherapy  Navigator Location [facility]  Navigator Encounter Type Treatment  Patient Visit Type MedOnc  Treatment Phase Active Tx  Barriers/Navigation Needs No Barriers At This Time  Interventions None Required  Acuity Level 1-No Barriers  Time Spent with Patient 15

## 2023-10-04 ENCOUNTER — Ambulatory Visit: Payer: Self-pay | Admitting: Hematology & Oncology

## 2023-10-04 ENCOUNTER — Encounter: Payer: Self-pay | Admitting: Internal Medicine

## 2023-10-04 ENCOUNTER — Telehealth: Payer: Self-pay

## 2023-10-04 ENCOUNTER — Ambulatory Visit (INDEPENDENT_AMBULATORY_CARE_PROVIDER_SITE_OTHER): Admitting: Internal Medicine

## 2023-10-04 ENCOUNTER — Other Ambulatory Visit: Payer: Self-pay

## 2023-10-04 ENCOUNTER — Other Ambulatory Visit: Payer: Self-pay | Admitting: *Deleted

## 2023-10-04 VITALS — BP 120/80 | HR 75 | Resp 16 | Ht 73.0 in | Wt 164.0 lb

## 2023-10-04 DIAGNOSIS — K089 Disorder of teeth and supporting structures, unspecified: Secondary | ICD-10-CM | POA: Diagnosis not present

## 2023-10-04 DIAGNOSIS — E538 Deficiency of other specified B group vitamins: Secondary | ICD-10-CM

## 2023-10-04 DIAGNOSIS — B20 Human immunodeficiency virus [HIV] disease: Secondary | ICD-10-CM | POA: Diagnosis not present

## 2023-10-04 DIAGNOSIS — C3492 Malignant neoplasm of unspecified part of left bronchus or lung: Secondary | ICD-10-CM

## 2023-10-04 DIAGNOSIS — Z8619 Personal history of other infectious and parasitic diseases: Secondary | ICD-10-CM | POA: Diagnosis not present

## 2023-10-04 DIAGNOSIS — H538 Other visual disturbances: Secondary | ICD-10-CM | POA: Diagnosis not present

## 2023-10-04 DIAGNOSIS — C3490 Malignant neoplasm of unspecified part of unspecified bronchus or lung: Secondary | ICD-10-CM

## 2023-10-04 MED ORDER — FOLIC ACID 1 MG PO TABS: 2.0000 mg | ORAL_TABLET | Freq: Every day | ORAL | 3 refills | Status: DC

## 2023-10-04 MED ORDER — BICTEGRAVIR-EMTRICITAB-TENOFOV 50-200-25 MG PO TABS
1.0000 | ORAL_TABLET | Freq: Every day | ORAL | 11 refills | Status: DC
Start: 2023-10-04 — End: 2023-11-14

## 2023-10-04 NOTE — Progress Notes (Signed)
 Subjective:    Patient ID: Christopher Henson, male    DOB: [birth year], 43 y.o.   MRN: [id number]  HPI Luisdaniel is here for follow up of HIV He has a history of a 184v mutation and has remained on Biktarvy  and Prezcobix  with no issues.   10/04/23 id clinic f/u First visit with me Reviewed genotype -- -12/2020 cabotegravir/bictegravir/elvitegravir resistance not predicted -12/2020 genotype nrti/nnrti/pi: m184v and r211k He missed a week ART as he couldn't make his appointments recently. 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); chemo platinum/taxol /bevacizumab /tecentriq  s/p 6 cycle since 01/2023;  alimta/avastin  s/p 4 cycle since 07/11/23; xrt completeded 09/18/23; zometa ; ecog 1 -reviewed labs   Other id hx: Hx crypto meningitis Lab Results  Component Value Date   CD4TCELL 18 (L) 12/14/2022   CD4TABS 258 (L) 12/14/2022  He was on fluconazole  at least since 2020 and last stopped 01/2023 12/19/22 crypto ag positive titer 2560 12/22/22 crypto ag negative   Soc: -smoker -- stopped 12/2022 -marijuanna some times; no other illiciats -lives with relatives now -not currently working -not sexually active since at least last visit 05/2022 with dr Efrain   Complaint: Blurry for right eye and photosensitivity one month -- stable; as of 10/04/23 visit still blurry vision No headache, nausea No fever chill   Hx poor dentition/periodontitis Nothing painful and no gum disease complaint today   Ros: Breathing issue stable with lung cancer/lung radiation No fever, chill, rash, headache, joint pain         Lab Results  Component Value Date   WBC 4.3 10/03/2023   HGB 7.5 (L) 10/03/2023   HCT 22.8 (L) 10/03/2023   MCV  101.8 (H) 10/03/2023   PLT 147 (L) 10/03/2023   Last metabolic panel Lab Results  Component Value Date   GLUCOSE 115 (H) 10/03/2023   NA 139 10/03/2023   K 3.8 10/03/2023   CL 106 10/03/2023   CO2 25 10/03/2023   BUN 10 10/03/2023   CREATININE 0.83 10/03/2023   GFRNONAA >60 10/03/2023   CALCIUM  8.9 10/03/2023   PHOS 3.1 01/03/2023   PROT 6.6 10/03/2023   ALBUMIN  3.7 10/03/2023   LABGLOB 2.6 02/25/2019   AGRATIO 1.7 02/25/2019   BILITOT 0.4 10/03/2023   ALKPHOS 80 10/03/2023   AST 17 10/03/2023   ALT 18 10/03/2023   ANIONGAP 8 10/03/2023         Objective:    Vitals:   10/04/23 1051  BP: 120/80  Pulse: 75  Resp: 16  SpO2: 99%    General/constitutional: no distress, pleasant HEENT: Normocephalic, PER, Conj Clear, EOMI, poor dentition several dental carries and partially missing teeth Neck supple CV: rrr no mrg Lungs: clear to auscultation, normal respiratory effort Abd: Soft, Nontender Ext: no edema Skin: No Rash Neuro: nonfocal MSK: no peripheral joint swelling/tenderness/warmth; back spines nontender    Labs: Lab Results  Component Value Date   WBC 4.3 10/03/2023   HGB 7.5 (L) 10/03/2023   HCT 22.8 (L) 10/03/2023   MCV 101.8 (H) 10/03/2023   PLT 147 (L) 10/03/2023   Last metabolic panel Lab Results  Component Value Date   GLUCOSE 115 (H) 10/03/2023   NA 139 10/03/2023   K 3.8 10/03/2023   CL 106  10/03/2023   CO2 25 10/03/2023   BUN 10 10/03/2023   CREATININE 0.83 10/03/2023   GFRNONAA >60 10/03/2023   CALCIUM  8.9 10/03/2023   PHOS 3.1 01/03/2023   PROT 6.6 10/03/2023   ALBUMIN  3.7 10/03/2023   LABGLOB 2.6 02/25/2019   AGRATIO 1.7 02/25/2019   BILITOT 0.4 10/03/2023   ALKPHOS 80 10/03/2023   AST 17 10/03/2023   ALT 18 10/03/2023   ANIONGAP 8 10/03/2023   Hiv: Lab Results  Component Value Date   HIV1RNAQUANT <20 12/26/2022   Lab Results  Component Value Date   CD4TCELL 18 (L) 12/14/2022   CD4TABS 258 (L) 12/14/2022         Assessment & Plan:   #hiv #reprieve trial/cad risk 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; phenotpyically only lamivudine resistance 10/04/23 id visit off biktarvy  for a week as couldn't make appointment Compliant and well controlled previously since 01/2023 only on biktarvy   Reviewed genotype and ok to continue biktarvy  with isolated m184v mutation (also r211k but ok)  -renew biktarvy  -will need to talk about statin use soon -discussed u=u -encourage compliance -continue current HIV medication -labs today  -f/u in 4-6 weeks (want to f/u soon to review eye sx)   #visual blurriness #hx crypto meningitis I don't think this is crypto relapse but I do worry that with chemo his cd4 count can drop and relapse might happen Last took fluc 12/2022  Other ddx optic neuritis/neuropathy, crvo in setting poor dentiation/risk for IE and cancer related thrombophilia  -referral to eye exam (requested ophthalmology [facility]) -f/u as above -crypto ag and titer (last negative 12/22/22)   #metastatic lung cancer  -On chemo and f/u oncology   #poor dentition At risk for ie  -rcid dental paperwork/referral placed    #hcm Will reviewed future visits -vaccine -hepatitis -std 10/03/23 visit not sexually active since last hiv visit 05/2022 and deferred std screen -cancer screening [facility]- will need to do anal cancer screening soon

## 2023-10-04 NOTE — Telephone Encounter (Signed)
 Dental Application filed today*Medicaid-[facility]

## 2023-10-04 NOTE — Telephone Encounter (Signed)
 As noted below by Dr. Timmy, I called the patient and informed him that his Folate level is low. He would like for you to start Folic Acid  2 mg by mouth daily. This should help with your anemia. He verbalized understanding.

## 2023-10-04 NOTE — Telephone Encounter (Signed)
-----   Message from Maude JONELLE Crease sent at 10/04/2023 10:11 AM EDT ----- He needs to be on folic acid .  Please call in folic acid  2 mg p.o. daily for him.  This may help his anemia.  Jeralyn ----- Message ----- From: Tonette Lauraine CHRISTELLA DEVONNA Sent: 10/04/2023   9:38 AM EDT To: Maude JONELLE Crease, MD   ----- Message ----- From: Rebecka, Lab In [place] Sent: 10/03/2023   8:29 AM EDT To: Lauraine CHRISTELLA Tonette, PA-C

## 2023-10-04 NOTE — Patient Instructions (Signed)
 Labs today   Referral placed for medical eye exam Please make sure this happens within the next 2-3 weeks   If headache, worsening vision need to go to er (pain, redness)   See me in around 4-6 weeks   Dental clinic referral vs going to see a dentist (at risk for heart infection which can cause brain or eye infarct)

## 2023-10-05 ENCOUNTER — Other Ambulatory Visit: Payer: Self-pay

## 2023-10-05 LAB — T-HELPER CELLS (CD4) COUNT (NOT AT ARMC)
CD4 % Helper T Cell: 32 % — ABNORMAL LOW (ref 33–65)
CD4 T Cell Abs: 190 /uL — ABNORMAL LOW (ref 400–1790)

## 2023-10-08 ENCOUNTER — Ambulatory Visit: Payer: Self-pay | Admitting: Internal Medicine

## 2023-10-08 ENCOUNTER — Other Ambulatory Visit: Payer: Self-pay

## 2023-10-08 LAB — CRYPTOCOCCAL AG, LTX SCR RFLX TITER
Cryptococcal Ag Titer: 1:256 {titer} — AB
MICRO NUMBER:: 16630586
SPECIMEN QUALITY:: ADEQUATE

## 2023-10-08 LAB — HIV-1 RNA QUANT-NO REFLEX-BLD
HIV 1 RNA Quant: 20 {copies}/mL — ABNORMAL HIGH
HIV-1 RNA Quant, Log: 1.3 {Log_copies}/mL — ABNORMAL HIGH

## 2023-10-10 ENCOUNTER — Other Ambulatory Visit (HOSPITAL_BASED_OUTPATIENT_CLINIC_OR_DEPARTMENT_OTHER): Payer: Self-pay

## 2023-10-10 ENCOUNTER — Other Ambulatory Visit: Payer: Self-pay | Admitting: Hematology & Oncology

## 2023-10-10 MED ORDER — OXYCODONE HCL 5 MG PO TABS
5.0000 mg | ORAL_TABLET | Freq: Four times a day (QID) | ORAL | 0 refills | Status: DC | PRN
  Filled 2023-10-10 – 2023-10-11 (×2): qty 60, 15d supply, fill #0

## 2023-10-11 ENCOUNTER — Other Ambulatory Visit: Payer: Self-pay | Admitting: Hematology & Oncology

## 2023-10-11 ENCOUNTER — Other Ambulatory Visit: Payer: Self-pay

## 2023-10-11 ENCOUNTER — Other Ambulatory Visit (HOSPITAL_BASED_OUTPATIENT_CLINIC_OR_DEPARTMENT_OTHER): Payer: Self-pay

## 2023-10-15 ENCOUNTER — Other Ambulatory Visit: Payer: Self-pay

## 2023-10-15 ENCOUNTER — Other Ambulatory Visit (HOSPITAL_BASED_OUTPATIENT_CLINIC_OR_DEPARTMENT_OTHER): Payer: Self-pay

## 2023-10-16 ENCOUNTER — Other Ambulatory Visit (HOSPITAL_BASED_OUTPATIENT_CLINIC_OR_DEPARTMENT_OTHER): Payer: Self-pay

## 2023-10-16 ENCOUNTER — Other Ambulatory Visit: Payer: Self-pay | Admitting: Hematology & Oncology

## 2023-10-16 MED ORDER — OXYCODONE HCL ER 15 MG PO T12A
15.0000 mg | EXTENDED_RELEASE_TABLET | Freq: Two times a day (BID) | ORAL | 0 refills | Status: DC
  Filled 2023-10-16: qty 60, 30d supply, fill #0

## 2023-10-17 NOTE — Progress Notes (Signed)
 Received prior authorization approval for Oxycontin  15mg  ER tablets via CoverMyMeds. Case ID: [id number]. Medication is approved through 01/16/2024.

## 2023-10-18 ENCOUNTER — Encounter: Payer: Self-pay | Admitting: Radiation Oncology

## 2023-10-21 ENCOUNTER — Other Ambulatory Visit: Payer: Self-pay

## 2023-10-21 ENCOUNTER — Emergency Department (HOSPITAL_COMMUNITY)
Admission: EM | Admit: 2023-10-21 | Discharge: 2023-10-21 | Disposition: A | Discharge status: Home / Self Care | Attending: Emergency Medicine | Admitting: Emergency Medicine

## 2023-10-21 ENCOUNTER — Emergency Department (HOSPITAL_COMMUNITY)

## 2023-10-21 ENCOUNTER — Encounter (HOSPITAL_COMMUNITY): Payer: Self-pay | Admitting: Emergency Medicine

## 2023-10-21 DIAGNOSIS — R911 Solitary pulmonary nodule: Secondary | ICD-10-CM | POA: Diagnosis not present

## 2023-10-21 DIAGNOSIS — J9811 Atelectasis: Secondary | ICD-10-CM | POA: Diagnosis not present

## 2023-10-21 DIAGNOSIS — Z85118 Personal history of other malignant neoplasm of bronchus and lung: Secondary | ICD-10-CM | POA: Insufficient documentation

## 2023-10-21 DIAGNOSIS — J984 Other disorders of lung: Secondary | ICD-10-CM | POA: Diagnosis not present

## 2023-10-21 DIAGNOSIS — R079 Chest pain, unspecified: Secondary | ICD-10-CM | POA: Diagnosis present

## 2023-10-21 DIAGNOSIS — B20 Human immunodeficiency virus [HIV] disease: Secondary | ICD-10-CM | POA: Diagnosis not present

## 2023-10-21 DIAGNOSIS — R0789 Other chest pain: Secondary | ICD-10-CM | POA: Diagnosis not present

## 2023-10-21 DIAGNOSIS — J90 Pleural effusion, not elsewhere classified: Secondary | ICD-10-CM | POA: Diagnosis not present

## 2023-10-21 LAB — BASIC METABOLIC PANEL WITH GFR
Anion gap: 9 (ref 5–15)
BUN: 8 mg/dL (ref 6–20)
CO2: 25 mmol/L (ref 22–32)
Calcium: 8.8 mg/dL — ABNORMAL LOW (ref 8.9–10.3)
Chloride: 108 mmol/L (ref 98–111)
Creatinine, Ser: 0.82 mg/dL (ref 0.61–1.24)
GFR, Estimated: >60 mL/min (ref >60)
Glucose, Bld: 105 mg/dL — ABNORMAL HIGH (ref 70–99)
Potassium: 3.6 mmol/L (ref 3.5–5.1)
Sodium: 142 mmol/L (ref 135–145)

## 2023-10-21 LAB — TROPONIN I (HIGH SENSITIVITY)
Troponin I (High Sensitivity): 5 ng/L (ref <18)
Troponin I (High Sensitivity): 5 ng/L (ref <18)

## 2023-10-21 LAB — CBC
HCT: 26.4 % — ABNORMAL LOW (ref 39.0–52.0)
Hemoglobin: 8.3 g/dL — ABNORMAL LOW (ref 13.0–17.0)
MCH: 34.9 pg — ABNORMAL HIGH (ref 26.0–34.0)
MCHC: 31.4 g/dL (ref 30.0–36.0)
MCV: 110.9 fL — ABNORMAL HIGH (ref 80.0–100.0)
Platelets: 111 K/uL — ABNORMAL LOW (ref 150–400)
RBC: 2.38 MIL/uL — ABNORMAL LOW (ref 4.22–5.81)
RDW: 20.8 % — ABNORMAL HIGH (ref 11.5–15.5)
WBC: 4.5 K/uL (ref 4.0–10.5)
nRBC: 0.4 % — ABNORMAL HIGH (ref 0.0–0.2)

## 2023-10-21 MED ORDER — MORPHINE SULFATE (PF) 4 MG/ML IV SOLN
4.0000 mg | Freq: Once | INTRAVENOUS | Status: AC
  Administered 2023-10-21: 4 mg via INTRAVENOUS
  Filled 2023-10-21: qty 1

## 2023-10-21 MED ORDER — ASPIRIN 81 MG PO CHEW
324.0000 mg | CHEWABLE_TABLET | Freq: Once | ORAL | Status: AC
  Administered 2023-10-21: 324 mg via ORAL
  Filled 2023-10-21: qty 4

## 2023-10-21 MED ORDER — SODIUM CHLORIDE 0.9 % IV BOLUS
500.0000 mL | Freq: Once | INTRAVENOUS | Status: AC
  Administered 2023-10-21: 500 mL via INTRAVENOUS

## 2023-10-21 MED ORDER — ONDANSETRON 4 MG PO TBDP
4.0000 mg | ORAL_TABLET | Freq: Once | ORAL | Status: AC
  Administered 2023-10-21: 4 mg via ORAL
  Filled 2023-10-21: qty 1

## 2023-10-21 NOTE — ED Provider Notes (Signed)
 Patient's care assumed at 6:30 AM.  Patient began having chest pain early this a.m.  Patient is pending second troponin.  Second troponin returned and is normal.  Patient reports experiencing some dizziness he has not been eating or drinking well.  Patient is given IV fluids.  Patient is currently on chemotherapy and he states that this has suppressed his appetite.  Patient encouraged to eat and drink at home.  He is advised to call his physician tomorrow to be rechecked.  Patient states he wants to go home.  Patient discharged in stable condition.   Flint Sonny POUR, PA-C 10/21/23 1043    Elnor Jayson LABOR, DO 10/26/23 1549

## 2023-10-21 NOTE — Progress Notes (Incomplete)
 Radiation Oncology         [phone] ________________________________  Name: Christopher Henson MRN: [id number]  Date: 10/22/2023  DOB: 1981-02-25  Follow-Up Visit Note  CC: Bulah Alm RAMAN, PA-C  Tysinger, Alm RAMAN, PA-C  No diagnosis found.  Diagnosis:  The primary encounter diagnosis was Malignant neoplasm of upper lobe bronchus, left (HCC). A diagnosis of Large cell neuroendocrine carcinoma (HCC) was also pertinent to this visit.   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   Interval Since Last Radiation: 1 month and 11 days   Intent: Palliative  Radiation Treatment Dates: First Treatment Date: 2023-09-11 -- Last Treatment Date: 2023-09-18 Site/Dose/Technique/Mode:  Plan Name: Lung_LUL_SBRT Site: Lung, Left Technique: SBRT/SRT-IMRT Mode: Photon Dose Per Fraction: 18 Gy Prescribed Dose (Delivered / Prescribed): 54 Gy / 54 Gy Prescribed Fxs (Delivered / Prescribed): 3 / 3   Plan Name: Lung_LLL_SBRT Site: Lung, Left Technique: SBRT/SRT-IMRT Mode: Photon Dose Per Fraction: 18 Gy Prescribed Dose (Delivered / Prescribed): 54 Gy / 54 Gy Prescribed Fxs (Delivered / Prescribed): 3 / 3  Narrative:  The patient returns today for routine follow-up. He tolerated radiation therapy relatively well overall without any significant side effects.   Since his first radiation treatment on 05/22, he received his 5th cycle of systemic therapy on 10/03/22. He seems to have tolerated chemotherapy well thus far without any significant side effects reported. He was however noted to have lost some weight during his most recent visit with Dr. Timmy on 10/03/23. Dr. Timmy would like to try him on megace  for this.   In most recent history he was seen in the ED yesterday due to sudden onset left sided chest pain. His pain apparently improved without intervention in the ED and his troponin came back  normal. He also endorsed some dizziness which he attributed to not eating and drinking well. He was subsequently given IV fluids and was encouraged to eat and drink well.   No other significant interval history since the patient was last seen for follow-up.   ***                              Allergies:  has no known allergies.  Meds: Current Outpatient Medications  Medication Sig Dispense Refill   acetaminophen  (TYLENOL ) 325 MG tablet Take 2 tablets (650 mg total) by mouth every 6 (six) hours as needed.     bictegravir-emtricitabine -tenofovir  AF (BIKTARVY ) 50-200-25 MG TABS tablet Take 1 tablet by mouth daily. 30 tablet 11   cromolyn (OPTICROM) 4 % ophthalmic solution SMARTSIG:In Eye(s)     dexamethasone  (DECADRON ) 4 MG tablet Take 1 tablet (4 mg total) by mouth 2 (two) times daily. Start day before each pemetrexed  dose and continue for 3 days.Take with food. (Patient not taking: Reported on 10/04/2023) 6 tablet 5   dronabinol  (MARINOL ) 5 MG capsule Take 1 capsule (5 mg total) by mouth 2 (two) times daily before a meal. (Patient not taking: Reported on 10/04/2023) 60 capsule 0   folic acid  (FOLVITE ) 1 MG tablet Take 1 tablet (1 mg total) by mouth daily. Start 7 days before pemetrexed  chemotherapy. Continue until 21 days after pemetrexed  completed. (Patient not taking: Reported on 10/04/2023) 100 tablet 3   folic acid  (FOLVITE ) 1 MG tablet Take 2 tablets (2 mg total) by mouth daily. 60 tablet 3   lidocaine -prilocaine  (  EMLA ) cream Apply to affected area once (Patient not taking: Reported on 10/04/2023) 30 g 3   megestrol  (MEGACE  ES) 625 MG/5ML suspension Take 5 mLs (625 mg total) by mouth daily. (Patient not taking: Reported on 10/04/2023) 150 mL 4   megestrol  (MEGACE ) 400 MG/10ML suspension Take 10 mLs (400 mg total) by mouth 2 (two) times daily. (Patient not taking: Reported on 10/04/2023) 240 mL 1   ondansetron  (ZOFRAN ) 8 MG tablet Take 1 tablet (8 mg total) by mouth every 8 (eight) hours as needed  for nausea or vomiting. (Patient not taking: Reported on 10/04/2023) 30 tablet 1   oxyCODONE  (OXY IR/ROXICODONE ) 5 MG immediate release tablet Take 1 tablet (5 mg total) by mouth every 6 (six) hours as needed. 60 tablet 0   oxyCODONE  (OXYCONTIN ) 15 mg 12 hr tablet Take 1 tablet (15 mg total) by mouth every 12 (twelve) hours. 60 tablet 0   OXYGEN  Inhale 2 L into the lungs daily as needed.     prochlorperazine  (COMPAZINE ) 10 MG tablet Take 1 tablet (10 mg total) by mouth every 6 (six) hours as needed for nausea or vomiting. (Patient not taking: Reported on 10/04/2023) 30 tablet 1   senna-docusate (SENOKOT-S) 8.6-50 MG tablet Take 1 tablet by mouth 2 (two) times daily between meals as needed for mild constipation. (Patient not taking: Reported on 10/03/2023)     sucralfate  (CARAFATE ) 1 g tablet Take 1 tablet (1 g total) by mouth 4 (four) times daily -  with meals and at bedtime. Crush and dissolve in 10 mL's of warm water prior to swallowing, take 20 min prior to meals (Patient not taking: Reported on 10/04/2023) 120 tablet 0   telmisartan  (MICARDIS ) 40 MG tablet Take 1 tablet (40 mg total) by mouth daily. (Patient not taking: Reported on 10/04/2023) 30 tablet 6   XIIDRA 5 % SOLN      No current facility-administered medications for this encounter.    Physical Findings: The patient is in no acute distress. Patient is alert and oriented.  vitals were not taken for this visit. .  No significant changes. Lungs are clear to auscultation bilaterally. Heart has regular rate and rhythm. No palpable cervical, supraclavicular, or axillary adenopathy. Abdomen soft, non-tender, normal bowel sounds.   Lab Findings: Lab Results  Component Value Date   WBC 4.5 10/21/2023   HGB 8.3 (L) 10/21/2023   HCT 26.4 (L) 10/21/2023   MCV 110.9 (H) 10/21/2023   PLT 111 (L) 10/21/2023    Radiographic Findings: DG Chest Port 1 View Result Date: 10/21/2023 CLINICAL DATA:  [id number]. Chest pain. Dizziness. History of lung  cancer. Last chemotherapy 3 weeks ago. EXAM: PORTABLE CHEST 1 VIEW COMPARISON:  Chest CT with contrast 08/02/2023. FINDINGS: A small left pleural effusion is unchanged. This appeared loculated in the dorsal chest base on the CT. There is overlying linear scarring, atelectasis and slight volume loss. The CT demonstrated a new pleural-based 1.3 cm solid nodule in the left apex which is probably still present but not well seen. It may be within the first anterior intercostal space and may or may not have changed in the interval. The rest of the lungs are clear. Right IJ port catheter terminates at the superior cavoatrial junction. The cardiac size is normal. The mediastinum appears unchanged. A double density adjacent the proximal aortic shadow is consistent with the anterior mediastinal adenopathy seen on CT. No new osseous abnormality is seen portably. Overlying monitor wires. IMPRESSION: 1. No acute chest findings. 2. Small  left pleural effusion with overlying scarring, atelectasis and slight volume loss. 3. The CT demonstrated a new pleural-based 1.3 cm solid nodule in the left apex which is probably still present but not well seen. It may be within the first anterior intercostal space and may or may not have changed in the interval. 4. Anterior mediastinal adenopathy. Electronically Signed   By: Francis Quam M.D.   On: 10/21/2023 06:13    Impression: The primary encounter diagnosis was Malignant neoplasm of upper lobe bronchus, left (HCC). A diagnosis of Large cell neuroendocrine carcinoma (HCC) was also pertinent to this visit.   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   The patient is recovering from the effects of radiation.  ***  Plan:  ***   *** minutes of total time was spent for this patient encounter, including preparation, face-to-face counseling with the patient and coordination of care,  physical exam, and documentation of the encounter. ____________________________________  Lynwood CHARM Nasuti, PhD, MD  This document serves as a record of services personally performed by Lynwood Nasuti, MD. It was created on his behalf by Dorthy Fuse, a trained medical scribe. The creation of this record is based on the scribe's personal observations and the provider's statements to them. This document has been checked and approved by the attending provider.

## 2023-10-21 NOTE — ED Triage Notes (Addendum)
 Pt in ambulatory POV with c/o central cp that woke him up, states 5/10 pain. Also reports dizziness, hx of Lung CA and AIDS. States his last chemo was 3wks ago

## 2023-10-21 NOTE — ED Provider Notes (Signed)
 [facility] Emergency Department Provider Note MRN:  [id number]  Arrival date & time: 10/21/23     Chief Complaint   Chest Pain   History of Present Illness   Christopher Henson is a 43 y.o. year-old male presents to the ED with chief complaint of chest pain.  He states that the pain awakened him from sleep around 330.  He states that the pain was on the left side of his chest.  He denies any shortness of breath.  Denies nausea, vomiting, fever, chills, cough.  He states that the pain has since subsided some.  He states that he still has slight pain.  He denies any leg swelling.  History of lung cancer, AIDS.  History provided by patient.   Review of Systems  Pertinent positive and negative review of systems noted in HPI.    Physical Exam   Vitals:   10/21/23 0510  BP: (!) 130/94  Pulse: 81  Resp: 16  SpO2: 97%    CONSTITUTIONAL:  non toxic-appearing, NAD NEURO:  Alert and oriented x 3, CN 3-12 grossly intact EYES:  eyes equal and reactive ENT/NECK:  Supple, no stridor  CARDIO:  normal rate, regular rhythm, appears well-perfused  PULM:  No respiratory distress, CTAB GI/GU:  non-distended,  MSK/SPINE:  No gross deformities, no edema, moves all extremities  SKIN:  no rash, atraumatic   *Additional and/or pertinent findings included in MDM below  Diagnostic and Interventional Summary    EKG Interpretation Date/Time:    Ventricular Rate:    PR Interval:    QRS Duration:    QT Interval:    QTC Calculation:   R Axis:      Text Interpretation:         Labs Reviewed  BASIC METABOLIC PANEL WITH GFR - Abnormal; Notable for the following components:      Result Value   Glucose, Bld 105 (*)    Calcium  8.8 (*)    All other components within normal limits  CBC - Abnormal; Notable for the following components:   RBC 2.38 (*)    Hemoglobin 8.3 (*)    HCT 26.4 (*)    MCV 110.9 (*)    MCH 34.9 (*)    RDW 20.8 (*)    Platelets 111 (*)    nRBC  0.4 (*)    All other components within normal limits  TROPONIN I (HIGH SENSITIVITY)    DG Chest Port 1 View  Final Result      Medications  aspirin  chewable tablet 324 mg (324 mg Oral Given 10/21/23 0530)  morphine  (PF) 4 MG/ML injection 4 mg (4 mg Intravenous Given 10/21/23 0531)     Procedures  /  Critical Care Procedures  ED Course and Medical Decision Making  I have reviewed the triage vital signs, the nursing notes, and pertinent available records from the EMR.  Social Determinants Affecting Complexity of Care: Patient has no clinically significant social determinants affecting this chief complaint..   ED Course: Clinical Course as of 10/21/23 [id number]  [place] Oct 21, 2023  0620 Basic metabolic panel(!) No significant electrolyte abnormality [RB]  0620 CBC(!) Near pancytopenia, likely 2/2 HIV, anemia likely 2/2 chronic disease [RB]  0622 Troponin I (High Sensitivity) Initial trop is 5.  Due to onset being 2 hours ago, will need a repeat. [RB]    Clinical Course User Index [RB] Vicky Charleston, PA-C    Medical Decision Making Patient here with chest pain that awakened him from  sleep out 2 hours ago.  The pain has improved now.  Will give aspirin .  Will check basic labs, EKG, chest x-ray.  Amount and/or Complexity of Data Reviewed Labs: ordered. Radiology: ordered.  Risk OTC drugs. Prescription drug management.         Consultants:    Treatment and Plan: Patient signed out at shift change.  Repeat trop pending.  If pain free and 2nd trop is negative, consider discharge home.    Final Clinical Impressions(s) / ED Diagnoses     ICD-10-CM   1. Chest pain, unspecified type  R07.9       ED Discharge Orders     None         Discharge Instructions Discussed with and Provided to Patient:   Discharge Instructions   None      Vicky Charleston, PA-C 10/21/23 [id number]    Trine Raynell Moder, MD 10/22/23 [phone]

## 2023-10-22 ENCOUNTER — Telehealth: Payer: Self-pay | Admitting: Radiation Oncology

## 2023-10-22 ENCOUNTER — Ambulatory Visit
Admission: RE | Admit: 2023-10-22 | Discharge: 2023-10-22 | Disposition: A | Discharge status: Home / Self Care | Source: Ambulatory Visit | Attending: Radiation Oncology | Admitting: Radiation Oncology

## 2023-10-22 NOTE — Telephone Encounter (Signed)
 7/14 @ 4:38 pm Left voicemail for patient to call our office to be r/s for his missed FU15 appt on today.

## 2023-10-23 ENCOUNTER — Other Ambulatory Visit: Payer: Self-pay

## 2023-10-24 ENCOUNTER — Other Ambulatory Visit: Payer: Self-pay | Admitting: Medical Oncology

## 2023-10-24 ENCOUNTER — Inpatient Hospital Stay: Attending: Hematology & Oncology

## 2023-10-24 ENCOUNTER — Encounter: Payer: Self-pay | Admitting: *Deleted

## 2023-10-24 ENCOUNTER — Inpatient Hospital Stay (HOSPITAL_BASED_OUTPATIENT_CLINIC_OR_DEPARTMENT_OTHER): Admitting: Medical Oncology

## 2023-10-24 ENCOUNTER — Inpatient Hospital Stay

## 2023-10-24 ENCOUNTER — Ambulatory Visit (HOSPITAL_COMMUNITY)
Admission: RE | Admit: 2023-10-24 | Discharge: 2023-10-24 | Disposition: A | Discharge status: Home / Self Care | Source: Ambulatory Visit | Attending: Medical Oncology | Admitting: Medical Oncology

## 2023-10-24 VITALS — BP 124/78 | HR 89 | Temp 98.3°F | Resp 19 | Ht 72.0 in | Wt 168.0 lb

## 2023-10-24 DIAGNOSIS — C7A1 Malignant poorly differentiated neuroendocrine tumors: Secondary | ICD-10-CM

## 2023-10-24 DIAGNOSIS — Z21 Asymptomatic human immunodeficiency virus [HIV] infection status: Secondary | ICD-10-CM | POA: Insufficient documentation

## 2023-10-24 DIAGNOSIS — H5711 Ocular pain, right eye: Secondary | ICD-10-CM | POA: Insufficient documentation

## 2023-10-24 DIAGNOSIS — H539 Unspecified visual disturbance: Secondary | ICD-10-CM | POA: Diagnosis not present

## 2023-10-24 DIAGNOSIS — Z5111 Encounter for antineoplastic chemotherapy: Secondary | ICD-10-CM | POA: Insufficient documentation

## 2023-10-24 DIAGNOSIS — Z5112 Encounter for antineoplastic immunotherapy: Secondary | ICD-10-CM | POA: Diagnosis present

## 2023-10-24 LAB — CBC WITH DIFFERENTIAL (CANCER CENTER ONLY)
Abs Immature Granulocytes: 0.02 K/uL (ref 0.00–0.07)
Basophils Absolute: 0 K/uL (ref 0.0–0.1)
Basophils Relative: 1 %
Eosinophils Absolute: 0.4 K/uL (ref 0.0–0.5)
Eosinophils Relative: 10 %
HCT: 24.2 % — ABNORMAL LOW (ref 39.0–52.0)
Hemoglobin: 7.9 g/dL — ABNORMAL LOW (ref 13.0–17.0)
Immature Granulocytes: 1 %
Lymphocytes Relative: 22 %
Lymphs Abs: 1 K/uL (ref 0.7–4.0)
MCH: 34.6 pg — ABNORMAL HIGH (ref 26.0–34.0)
MCHC: 32.6 g/dL (ref 30.0–36.0)
MCV: 106.1 fL — ABNORMAL HIGH (ref 80.0–100.0)
Monocytes Absolute: 0.9 K/uL (ref 0.1–1.0)
Monocytes Relative: 21 %
Neutro Abs: 1.9 K/uL (ref 1.7–7.7)
Neutrophils Relative %: 45 %
Platelet Count: 128 K/uL — ABNORMAL LOW (ref 150–400)
RBC: 2.28 MIL/uL — ABNORMAL LOW (ref 4.22–5.81)
RDW: 20.1 % — ABNORMAL HIGH (ref 11.5–15.5)
WBC Count: 4.3 K/uL (ref 4.0–10.5)
nRBC: 0 % (ref 0.0–0.2)

## 2023-10-24 LAB — CMP (CANCER CENTER ONLY)
ALT: 19 U/L (ref 0–44)
AST: 20 U/L (ref 15–41)
Albumin: 3.5 g/dL (ref 3.5–5.0)
Alkaline Phosphatase: 87 U/L (ref 38–126)
Anion gap: 5 (ref 5–15)
BUN: 10 mg/dL (ref 6–20)
CO2: 28 mmol/L (ref 22–32)
Calcium: 8.9 mg/dL (ref 8.9–10.3)
Chloride: 108 mmol/L (ref 98–111)
Creatinine: 0.84 mg/dL (ref 0.61–1.24)
GFR, Estimated: >60 mL/min (ref >60)
Glucose, Bld: 88 mg/dL (ref 70–99)
Potassium: 3.7 mmol/L (ref 3.5–5.1)
Sodium: 141 mmol/L (ref 135–145)
Total Bilirubin: 0.5 mg/dL (ref 0.0–1.2)
Total Protein: 6.4 g/dL — ABNORMAL LOW (ref 6.5–8.1)

## 2023-10-24 LAB — TOTAL PROTEIN, URINE DIPSTICK: Protein, ur: NEGATIVE mg/dL

## 2023-10-24 LAB — LACTATE DEHYDROGENASE: LDH: 615 U/L — ABNORMAL HIGH (ref 98–192)

## 2023-10-24 LAB — PREALBUMIN: Prealbumin: 21 mg/dL (ref 18–38)

## 2023-10-24 LAB — SAMPLE TO BLOOD BANK

## 2023-10-24 MED ORDER — SODIUM CHLORIDE 0.9 % IV SOLN
500.0000 mg/m2 | Freq: Once | INTRAVENOUS | Status: AC
  Administered 2023-10-24: 1000 mg via INTRAVENOUS
  Filled 2023-10-24: qty 40

## 2023-10-24 MED ORDER — SODIUM CHLORIDE 0.9% FLUSH
10.0000 mL | INTRAVENOUS | Status: DC | PRN
  Administered 2023-10-24: 10 mL

## 2023-10-24 MED ORDER — SODIUM CHLORIDE 0.9 % IV SOLN
15.0000 mg/kg | Freq: Once | INTRAVENOUS | Status: AC
  Administered 2023-10-24: 1200 mg via INTRAVENOUS
  Filled 2023-10-24: qty 48

## 2023-10-24 MED ORDER — SODIUM CHLORIDE 0.9 % IV SOLN: INTRAVENOUS | Status: DC

## 2023-10-24 MED ORDER — HEPARIN SOD (PORK) LOCK FLUSH 100 UNIT/ML IV SOLN
500.0000 [IU] | Freq: Once | INTRAVENOUS | Status: AC | PRN
  Administered 2023-10-24: 500 [IU]

## 2023-10-24 MED ORDER — SODIUM CHLORIDE 0.9 % IV SOLN
INTRAVENOUS | Status: DC
Start: 2023-10-24 — End: 2023-10-24

## 2023-10-24 MED ORDER — ZOLEDRONIC ACID 4 MG/100ML IV SOLN
4.0000 mg | Freq: Once | INTRAVENOUS | Status: AC
  Administered 2023-10-24: 4 mg via INTRAVENOUS
  Filled 2023-10-24: qty 100

## 2023-10-24 MED ORDER — PROCHLORPERAZINE MALEATE 10 MG PO TABS
10.0000 mg | ORAL_TABLET | Freq: Once | ORAL | Status: DC
Start: 2023-10-24 — End: 2023-10-24

## 2023-10-24 MED ORDER — CYANOCOBALAMIN 1000 MCG/ML IJ SOLN
1000.0000 ug | Freq: Once | INTRAMUSCULAR | Status: AC
  Administered 2023-10-24: 1000 ug via INTRAMUSCULAR
  Filled 2023-10-24: qty 1

## 2023-10-24 MED ORDER — GADOBUTROL 1 MMOL/ML IV SOLN: 8.0000 mL | Freq: Once | INTRAVENOUS | Status: DC | PRN

## 2023-10-24 NOTE — Progress Notes (Signed)
'  Ok treat with Hgb 7.9. No blood transfusion . Treatment gorden Domino ,[place]

## 2023-10-24 NOTE — Progress Notes (Signed)
 Hematology and Oncology Follow Up Visit  Christopher Henson [id number] 01-20-1981 43 y.o. 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  He underwent his [facility] radiosurgery for the lung lung and adrenal lesion.  He completed this back on 09/18/2023.  He has had no problems with bleeding.  He has had no obvious change in bowel or bladder habits.    He continues to have right eye pain without history of injury. Vision has worsened as well and he has had numerous headaches. No vomiting. No current headache.   He did have a chest tube taken out and so far there is been no recurrence of a pleural effusion. Left flank/chest pain is stable.   He has had no mouth sores.  He has had no rashes.  There may be a little bit of neuropathy in the hands and feet.  Last CT was performed in April.   Overall, I would have said that his performance status probably ECOG 1.  Wt Readings from Last 3 Encounters:  10/24/23 168 lb (76.2 kg)  10/21/23 164 lb (74.4 kg)  10/04/23 164 lb (74.4 kg)   Medications:  Current Outpatient Medications:    acetaminophen  (TYLENOL ) 325 MG tablet, Take 2 tablets (650 mg total) by mouth every 6 (six) hours as needed., Disp: , Rfl:    bictegravir-emtricitabine -tenofovir  AF (BIKTARVY ) 50-200-25 MG TABS tablet, Take 1 tablet by mouth daily., Disp: 30 tablet, Rfl: 11   cromolyn (OPTICROM) 4 % ophthalmic solution, SMARTSIG:In Eye(s), Disp: , Rfl:     dexamethasone  (DECADRON ) 4 MG tablet, Take 1 tablet (4 mg total) by mouth 2 (two) times daily. Start day before each pemetrexed  dose and continue for 3 days.Take with food., Disp: 6 tablet, Rfl: 5   dronabinol  (MARINOL ) 5 MG capsule, Take 1 capsule (5 mg total) by mouth 2 (two) times daily before a meal., Disp: 60 capsule, Rfl: 0   folic acid  (FOLVITE ) 1 MG tablet, Take 1 tablet (1 mg total) by mouth daily. Start 7 days before pemetrexed  chemotherapy. Continue until 21 days after pemetrexed  completed., Disp: 100 tablet, Rfl: 3   folic acid  (FOLVITE ) 1 MG tablet, Take 2 tablets (2 mg total) by mouth daily., Disp: 60 tablet, Rfl: 3   lidocaine -prilocaine  (EMLA ) cream, Apply to affected area once, Disp: 30 g, Rfl: 3   megestrol  (MEGACE  ES) 625 MG/5ML suspension, Take 5 mLs (625 mg total) by mouth daily., Disp: 150 mL, Rfl: 4   megestrol  (MEGACE ) 400 MG/10ML suspension, Take 10 mLs (400 mg total) by mouth 2 (two) times daily., Disp: 240 mL, Rfl: 1   ondansetron  (ZOFRAN ) 8 MG tablet, Take 1 tablet (8 mg total) by mouth every 8 (eight) hours as needed for nausea or vomiting., Disp: 30 tablet, Rfl: 1   oxyCODONE  (OXY IR/ROXICODONE ) 5 MG immediate release tablet, Take 1 tablet (5 mg total) by mouth every 6 (six) hours as needed., Disp: 60 tablet, Rfl: 0   oxyCODONE  (OXYCONTIN ) 15 mg 12 hr tablet, Take 1 tablet (15 mg total) by  mouth every 12 (twelve) hours., Disp: 60 tablet, Rfl: 0   OXYGEN , Inhale 2 L into the lungs daily as needed., Disp: , Rfl:    prochlorperazine  (COMPAZINE ) 10 MG tablet, Take 1 tablet (10 mg total) by mouth every 6 (six) hours as needed for nausea or vomiting., Disp: 30 tablet, Rfl: 1   senna-docusate (SENOKOT-S) 8.6-50 MG tablet, Take 1 tablet by mouth 2 (two) times daily between meals as needed for mild constipation., Disp: , Rfl:    sucralfate  (CARAFATE ) 1 g tablet, Take 1 tablet (1 g total) by mouth 4 (four) times daily -  with meals and at bedtime. Crush and dissolve in 10 mL's of  warm water prior to swallowing, take 20 min prior to meals, Disp: 120 tablet, Rfl: 0   telmisartan  (MICARDIS ) 40 MG tablet, Take 1 tablet (40 mg total) by mouth daily., Disp: 30 tablet, Rfl: 6   XIIDRA 5 % SOLN, , Disp: , Rfl:   Allergies: No Known Allergies  Past Medical History, Surgical history, Social history, and Family History were reviewed and updated.  Review of Systems: Review of Systems  Constitutional: Negative.   HENT:  Negative.    Eyes:  Positive for eye problems.  Respiratory:  Positive for cough and shortness of breath.   Cardiovascular:  Negative for chest pain.  Gastrointestinal: Negative.   Endocrine: Negative.   Genitourinary: Negative.    Musculoskeletal: Negative.   Skin: Negative.   Neurological:  Positive for headaches.  Hematological: Negative.   Psychiatric/Behavioral: Negative.      Physical Exam: Vitals:   10/24/23 0947  BP: 124/78  Pulse: 89  Resp: 19  Temp: 98.3 F (36.8 C)  SpO2: 100%    Wt Readings from Last 3 Encounters:  10/24/23 168 lb (76.2 kg)  10/21/23 164 lb (74.4 kg)  10/04/23 164 lb (74.4 kg)    Physical Exam Vitals reviewed.  HENT:     Head: Normocephalic and atraumatic.  Eyes:     Pupils: Pupils are equal, round, and reactive to light.  Cardiovascular:     Rate and Rhythm: Normal rate and regular rhythm.     Heart sounds: Normal heart sounds.  Pulmonary:     Effort: Pulmonary effort is normal.     Breath sounds: Normal breath sounds.     Comments: His lungs sound relatively clear bilaterally.  Maybe a little bit decreased and breath sounds are on the left lower lung. Abdominal:     General: Bowel sounds are normal.     Palpations: Abdomen is soft.     Comments: Abdominal exam is soft.  He has good bowel sounds.  There is no fluid wave but his abdomen feels mild to moderately distended. There is no palpable liver or spleen tip.  Musculoskeletal:        General: No tenderness or deformity. Normal range of motion.      Cervical back: Normal range of motion.  Lymphadenopathy:     Cervical: No cervical adenopathy.  Skin:    General: Skin is warm and dry.     Findings: No erythema or rash.  Neurological:     General: No focal deficit present.     Mental Status: He is alert and oriented to person, place, and time.     Cranial Nerves: No cranial nerve deficit.     Coordination: Coordination normal.     Gait: Gait normal.  Psychiatric:        Behavior: Behavior normal.  Thought Content: Thought content normal.        Judgment: Judgment normal.      Lab Results  Component Value Date   WBC 4.3 10/24/2023   HGB 7.9 (L) 10/24/2023   HCT 24.2 (L) 10/24/2023   MCV 106.1 (H) 10/24/2023   PLT 128 (L) 10/24/2023     Chemistry      Component Value Date/Time   NA 142 10/21/2023 0524   NA 143 02/25/2019 1041   K 3.6 10/21/2023 0524   CL 108 10/21/2023 0524   CO2 25 10/21/2023 0524   BUN 8 10/21/2023 0524   BUN 8 02/25/2019 1041   CREATININE 0.82 10/21/2023 0524   CREATININE 0.83 10/03/2023 0800   CREATININE 0.89 12/14/2022 1132      Component Value Date/Time   CALCIUM  8.8 (L) 10/21/2023 0524   ALKPHOS 80 10/03/2023 0800   AST 17 10/03/2023 0800   ALT 18 10/03/2023 0800   BILITOT 0.4 10/03/2023 0800     Encounter Diagnoses  Name Primary?   Pain of right eye Yes   Visual changes    Large cell neuroendocrine carcinoma (HCC)     Impression and Plan: Christopher Henson is a very nice 43 year old African-American male. He has what certainly is acting like a large cell neuroendocrine carcinoma. He is currently getting SBRT (s/p 1 of 3)as well as chemotherapy with Alimta /Avastin .  MRI brain for his visual changes and right eye pain.   Proceeding forward today with his 6th cycle of treatment. RTC 1 week port labs +_ 1 unit of blood RTC 3 weeks MD, port labs, Alimta /Avastin  cycle 7   Lauraine HERO [place], [place] 7/16/202510:10 AM

## 2023-10-24 NOTE — Progress Notes (Incomplete)
 Radiation Oncology         [phone] ________________________________  Name: Christopher Henson MRN: [id number]  Date: 10/25/2023  DOB: [birth year]  Follow-Up Visit Note  CC: Bulah Alm RAMAN, PA-C  Tysinger, Alm RAMAN, PA-C  No diagnosis found.  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 He completed palliative radiation to these sites on 07/06/2023. He presented in May of 2025 with progressive disease within the left lung; s/p SBRT completed on 09/18/2023.   Interval Since Last Radiation: 2 months and 7 days    Intent: Palliative   Radiation Treatment Dates:  First Treatment Date: 2023-09-11 -- Last Treatment Date: 2023-09-18  Site/Dose/Technique/Mode:  Plan Name: Lung_LUL_SBRT Site: Lung, Left Technique: SBRT/SRT-IMRT Mode: Photon Dose Per Fraction: 18 Gy Prescribed Dose (Delivered / Prescribed): 54 Gy / 54 Gy Prescribed Fxs (Delivered / Prescribed): 3 / 3   Plan Name: Lung_LLL_SBRT Site: Lung, Left Technique: SBRT/SRT-IMRT Mode: Photon Dose Per Fraction: 18 Gy Prescribed Dose (Delivered / Prescribed): 54 Gy / 54 Gy Prescribed Fxs (Delivered / Prescribed): 3 / 3   Narrative:  The patient returns today for routine follow-up. Patient continued to follow up with their specialists to manage their chronic conditions. He tolerated radiation therapy relatively well overall without any significant side effects.    Since his first radiation treatment on 05/22, he received his 5th cycle of systemic therapy on 10/03/22. He seems to have tolerated chemotherapy well thus far without any significant side effects reported. He was however noted to have lost some weight during his most recent visit with Dr. Timmy on 10/03/23. Dr. Timmy would like to try him on megace  for this.    In most recent history he was seen in the ED yesterday due to sudden onset left sided chest pain.  His pain apparently improved without intervention in the ED and his troponin came back normal. He also endorsed some dizziness which he attributed to not eating and drinking well. He was subsequently given IV fluids and was encouraged to eat and drink well.    No other significant interval history since the patient was last seen for follow-up.    Narrative:  The patient returns today for routine follow-up.  ***                              Allergies:  has no known allergies.  Meds: Current Outpatient Medications  Medication Sig Dispense Refill   acetaminophen  (TYLENOL ) 325 MG tablet Take 2 tablets (650 mg total) by mouth every 6 (six) hours as needed.     bictegravir-emtricitabine -tenofovir  AF (BIKTARVY ) 50-200-25 MG TABS tablet Take 1 tablet by mouth daily. 30 tablet 11   cromolyn (OPTICROM) 4 % ophthalmic solution SMARTSIG:In Eye(s)     dexamethasone  (DECADRON ) 4 MG tablet Take 1 tablet (4 mg total) by mouth 2 (two) times daily. Start day before each pemetrexed  dose and continue for 3 days.Take with food. 6 tablet 5   dronabinol  (MARINOL ) 5 MG capsule Take 1 capsule (5 mg total) by mouth 2 (two) times daily before a meal. 60 capsule 0   folic acid  (FOLVITE ) 1 MG tablet Take 1 tablet (1 mg total) by mouth daily. Start 7 days before pemetrexed  chemotherapy. Continue until 21 days after pemetrexed  completed. 100 tablet 3   folic acid  (FOLVITE ) 1 MG tablet Take 2  tablets (2 mg total) by mouth daily. 60 tablet 3   lidocaine -prilocaine  (EMLA ) cream Apply to affected area once 30 g 3   megestrol  (MEGACE  ES) 625 MG/5ML suspension Take 5 mLs (625 mg total) by mouth daily. 150 mL 4   megestrol  (MEGACE ) 400 MG/10ML suspension Take 10 mLs (400 mg total) by mouth 2 (two) times daily. 240 mL 1   ondansetron  (ZOFRAN ) 8 MG tablet Take 1 tablet (8 mg total) by mouth every 8 (eight) hours as needed for nausea or vomiting. 30 tablet 1   oxyCODONE  (OXY IR/ROXICODONE ) 5 MG immediate release tablet Take 1 tablet  (5 mg total) by mouth every 6 (six) hours as needed. 60 tablet 0   oxyCODONE  (OXYCONTIN ) 15 mg 12 hr tablet Take 1 tablet (15 mg total) by mouth every 12 (twelve) hours. 60 tablet 0   OXYGEN  Inhale 2 L into the lungs daily as needed.     prochlorperazine  (COMPAZINE ) 10 MG tablet Take 1 tablet (10 mg total) by mouth every 6 (six) hours as needed for nausea or vomiting. 30 tablet 1   senna-docusate (SENOKOT-S) 8.6-50 MG tablet Take 1 tablet by mouth 2 (two) times daily between meals as needed for mild constipation.     sucralfate  (CARAFATE ) 1 g tablet Take 1 tablet (1 g total) by mouth 4 (four) times daily -  with meals and at bedtime. Crush and dissolve in 10 mL's of warm water prior to swallowing, take 20 min prior to meals 120 tablet 0   telmisartan  (MICARDIS ) 40 MG tablet Take 1 tablet (40 mg total) by mouth daily. 30 tablet 6   XIIDRA 5 % SOLN      No current facility-administered medications for this encounter.   Facility-Administered Medications Ordered in Other Encounters  Medication Dose Route Frequency Provider Last Rate Last Admin   gadobutrol  (GADAVIST ) 1 MMOL/ML injection 8 mL  8 mL Intravenous Once PRN Tonette Lauraine HERO, PA-C        Physical Findings: The patient is in no acute distress. Patient is alert and oriented.  vitals were not taken for this visit. .  No significant changes. Lungs are clear to auscultation bilaterally. Heart has regular rate and rhythm. No palpable cervical, supraclavicular, or axillary adenopathy. Abdomen soft, non-tender, normal bowel sounds.   Lab Findings: Lab Results  Component Value Date   WBC 4.3 10/24/2023   HGB 7.9 (L) 10/24/2023   HCT 24.2 (L) 10/24/2023   MCV 106.1 (H) 10/24/2023   PLT 128 (L) 10/24/2023    Radiographic Findings: DG Chest Port 1 View Result Date: 10/21/2023 CLINICAL DATA:  [id number]. Chest pain. Dizziness. History of lung cancer. Last chemotherapy 3 weeks ago. EXAM: PORTABLE CHEST 1 VIEW COMPARISON:  Chest CT with  contrast 08/02/2023. FINDINGS: A small left pleural effusion is unchanged. This appeared loculated in the dorsal chest base on the CT. There is overlying linear scarring, atelectasis and slight volume loss. The CT demonstrated a new pleural-based 1.3 cm solid nodule in the left apex which is probably still present but not well seen. It may be within the first anterior intercostal space and may or may not have changed in the interval. The rest of the lungs are clear. Right IJ port catheter terminates at the superior cavoatrial junction. The cardiac size is normal. The mediastinum appears unchanged. A double density adjacent the proximal aortic shadow is consistent with the anterior mediastinal adenopathy seen on CT. No new osseous abnormality is seen portably. Overlying monitor wires. IMPRESSION: 1.  No acute chest findings. 2. Small left pleural effusion with overlying scarring, atelectasis and slight volume loss. 3. The CT demonstrated a new pleural-based 1.3 cm solid nodule in the left apex which is probably still present but not well seen. It may be within the first anterior intercostal space and may or may not have changed in the interval. 4. Anterior mediastinal adenopathy. Electronically Signed   By: Francis Quam M.D.   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 He completed palliative radiation to these sites on 07/06/2023. He presented in May of 2025 with progressive disease within the left lung; s/p SBRT completed on 09/18/2023.  The patient is recovering from the effects of radiation.  ***  Plan:  ***   *** minutes of total time was spent for this patient encounter, including preparation, face-to-face counseling with the patient and coordination of care, physical exam, and documentation of the encounter. ____________________________________  Lynwood CHARM Nasuti,  PhD, MD  This document serves as a record of services personally performed by Lynwood Nasuti, MD. It was created on his behalf by Reymundo Cartwright, a trained medical scribe. The creation of this record is based on the scribe's personal observations and the provider's statements to them. This document has been checked and approved by the attending provider.

## 2023-10-24 NOTE — Progress Notes (Signed)
 Patient c/o visual changes. MRI ordered and scheduled for later today. Patient will proceed with cycle six.   Oncology Nurse Navigator Documentation     10/24/2023    9:15 AM  Oncology Nurse Navigator Flowsheets  Navigator Follow Up Date: 11/14/2023  Navigator Follow Up Reason: Follow-up Appointment;Chemotherapy  Navigator Location [facility]  Navigator Encounter Type Follow-up Appt  Patient Visit Type MedOnc  Treatment Phase Active Tx  Barriers/Navigation Needs No Barriers At This Time  Interventions None Required  Acuity Level 1-No Barriers  Time Spent with Patient 15

## 2023-10-24 NOTE — Progress Notes (Addendum)
 Ok to treat w Hg 7.9.  Bridgett Leach [place], [place], BCPS, BCOP 10/24/2023 10:35 AM

## 2023-10-25 ENCOUNTER — Other Ambulatory Visit: Payer: Self-pay | Admitting: Medical Oncology

## 2023-10-25 ENCOUNTER — Ambulatory Visit
Admission: RE | Admit: 2023-10-25 | Discharge: 2023-10-25 | Disposition: A | Discharge status: Home / Self Care | Source: Ambulatory Visit | Attending: Radiation Oncology | Admitting: Radiation Oncology

## 2023-10-25 ENCOUNTER — Telehealth: Payer: Self-pay | Admitting: Medical Oncology

## 2023-10-25 DIAGNOSIS — C7931 Secondary malignant neoplasm of brain: Secondary | ICD-10-CM

## 2023-10-25 DIAGNOSIS — C7A1 Malignant poorly differentiated neuroendocrine tumors: Secondary | ICD-10-CM

## 2023-10-25 MED ORDER — DEXAMETHASONE 4 MG PO TABS: ORAL_TABLET | ORAL | 1 refills | Status: DC

## 2023-10-25 NOTE — Telephone Encounter (Signed)
 Attempted to call patient x 2- voicemail is full.   Hoping to speak to him myself to go over his MRI results and to discuss a medication that I am going to send in for him to help with his headaches and eye pain. Also wanted to discuss additional imaging that is recommended.   Will alert Dr. Timmy in case he calls back tomorrow.

## 2023-10-25 NOTE — Telephone Encounter (Signed)
 Called to schedule for labs/infusion per 7/16 LOS. Unable to LVM to return call for scheduling.

## 2023-10-26 ENCOUNTER — Encounter: Payer: Self-pay | Admitting: *Deleted

## 2023-10-26 ENCOUNTER — Other Ambulatory Visit: Payer: Self-pay

## 2023-10-26 ENCOUNTER — Telehealth: Payer: Self-pay | Admitting: Radiation Oncology

## 2023-10-26 NOTE — Progress Notes (Signed)
 From Lauraine Dais PA:  Just as an FYI I have attempted to call patient twice- voicemail is full. He has brain mets which are new. He does not know. Please see my phone note from today with further instructions(AKA he needs an additional scan and I have sent in Decadron  to the pharmacy to help with his symptoms). Please let Dr. Timmy know if he calls tomorrow when I am not here to take the call so that we can inform him of the brain mets.    Spoke to patient this afternoon. He is aware of new MRI findings and new prescription which he needs to start ASAP. I informed him that RadOnc was trying to reach him to schedule a new consultation with him, and that he needed to schedule another MRI for radiation planning. Number for centralized scheduling provided.   Patient states he will start decadron , and call both RadOnc and scheduling.  Oncology Nurse Navigator Documentation     10/26/2023    2:00 PM  Oncology Nurse Navigator Flowsheets  Navigator Follow Up Date: 11/14/2023  Navigator Follow Up Reason: Follow-up Appointment  Navigator Location [facility]  Navigator Encounter Type Telephone  Telephone Outgoing Call  Patient Visit Type MedOnc  Treatment Phase Active Tx  Barriers/Navigation Needs Coordination of Care;Education  Education Other  Interventions Coordination of Care;Education  Acuity Level 1-No Barriers  Coordination of Care Other  Education Method Verbal;Teach-back  Time Spent with Patient 30

## 2023-10-26 NOTE — Telephone Encounter (Signed)
 Called patient to schedule a consultation w. Dr. Roselind Messier. No answer, LVM for a return call.

## 2023-10-27 ENCOUNTER — Ambulatory Visit (HOSPITAL_COMMUNITY)
Admission: RE | Admit: 2023-10-27 | Discharge: 2023-10-27 | Disposition: A | Discharge status: Home / Self Care | Source: Ambulatory Visit | Attending: Medical Oncology | Admitting: Medical Oncology

## 2023-10-27 ENCOUNTER — Other Ambulatory Visit: Payer: Self-pay | Admitting: Medical Oncology

## 2023-10-27 DIAGNOSIS — C7931 Secondary malignant neoplasm of brain: Secondary | ICD-10-CM | POA: Diagnosis not present

## 2023-10-27 DIAGNOSIS — C7A1 Malignant poorly differentiated neuroendocrine tumors: Secondary | ICD-10-CM | POA: Diagnosis present

## 2023-10-27 DIAGNOSIS — R9082 White matter disease, unspecified: Secondary | ICD-10-CM | POA: Diagnosis not present

## 2023-10-29 ENCOUNTER — Other Ambulatory Visit: Payer: Self-pay | Admitting: *Deleted

## 2023-10-29 ENCOUNTER — Inpatient Hospital Stay

## 2023-10-29 DIAGNOSIS — H2000 Unspecified acute and subacute iridocyclitis: Secondary | ICD-10-CM | POA: Diagnosis not present

## 2023-10-29 DIAGNOSIS — R0602 Shortness of breath: Secondary | ICD-10-CM

## 2023-10-29 DIAGNOSIS — H25811 Combined forms of age-related cataract, right eye: Secondary | ICD-10-CM | POA: Diagnosis not present

## 2023-10-29 DIAGNOSIS — C7A1 Malignant poorly differentiated neuroendocrine tumors: Secondary | ICD-10-CM

## 2023-10-29 DIAGNOSIS — H2512 Age-related nuclear cataract, left eye: Secondary | ICD-10-CM | POA: Diagnosis not present

## 2023-10-29 DIAGNOSIS — C7931 Secondary malignant neoplasm of brain: Secondary | ICD-10-CM

## 2023-10-29 DIAGNOSIS — H40051 Ocular hypertension, right eye: Secondary | ICD-10-CM | POA: Diagnosis not present

## 2023-10-30 ENCOUNTER — Other Ambulatory Visit: Payer: Self-pay | Admitting: *Deleted

## 2023-10-30 ENCOUNTER — Inpatient Hospital Stay

## 2023-10-30 ENCOUNTER — Other Ambulatory Visit: Payer: Self-pay

## 2023-10-30 ENCOUNTER — Encounter

## 2023-10-30 ENCOUNTER — Telehealth: Payer: Self-pay

## 2023-10-30 ENCOUNTER — Other Ambulatory Visit: Payer: Self-pay | Admitting: Radiation Therapy

## 2023-10-30 DIAGNOSIS — C7931 Secondary malignant neoplasm of brain: Secondary | ICD-10-CM

## 2023-10-30 DIAGNOSIS — Z5112 Encounter for antineoplastic immunotherapy: Secondary | ICD-10-CM | POA: Diagnosis not present

## 2023-10-30 DIAGNOSIS — R0602 Shortness of breath: Secondary | ICD-10-CM

## 2023-10-30 DIAGNOSIS — C7A1 Malignant poorly differentiated neuroendocrine tumors: Secondary | ICD-10-CM

## 2023-10-30 LAB — CBC WITH DIFFERENTIAL (CANCER CENTER ONLY)
Abs Immature Granulocytes: 0.03 K/uL (ref 0.00–0.07)
Basophils Absolute: 0 K/uL (ref 0.0–0.1)
Basophils Relative: 0 %
Eosinophils Absolute: 0 K/uL (ref 0.0–0.5)
Eosinophils Relative: 0 %
HCT: 22.6 % — ABNORMAL LOW (ref 39.0–52.0)
Hemoglobin: 7.7 g/dL — ABNORMAL LOW (ref 13.0–17.0)
Immature Granulocytes: 1 %
Lymphocytes Relative: 17 %
Lymphs Abs: 0.7 K/uL (ref 0.7–4.0)
MCH: 35.3 pg — ABNORMAL HIGH (ref 26.0–34.0)
MCHC: 34.1 g/dL (ref 30.0–36.0)
MCV: 103.7 fL — ABNORMAL HIGH (ref 80.0–100.0)
Monocytes Absolute: 0 K/uL — ABNORMAL LOW (ref 0.1–1.0)
Monocytes Relative: 1 %
Neutro Abs: 3.2 K/uL (ref 1.7–7.7)
Neutrophils Relative %: 81 %
Platelet Count: 105 K/uL — ABNORMAL LOW (ref 150–400)
RBC: 2.18 MIL/uL — ABNORMAL LOW (ref 4.22–5.81)
RDW: 18.2 % — ABNORMAL HIGH (ref 11.5–15.5)
Smear Review: NORMAL
WBC Count: 3.9 K/uL — ABNORMAL LOW (ref 4.0–10.5)
nRBC: 0.5 % — ABNORMAL HIGH (ref 0.0–0.2)

## 2023-10-30 LAB — CMP (CANCER CENTER ONLY)
ALT: 54 U/L — ABNORMAL HIGH (ref 0–44)
AST: 37 U/L (ref 15–41)
Albumin: 3.8 g/dL (ref 3.5–5.0)
Alkaline Phosphatase: 90 U/L (ref 38–126)
Anion gap: 11 (ref 5–15)
BUN: 12 mg/dL (ref 6–20)
CO2: 25 mmol/L (ref 22–32)
Calcium: 8.5 mg/dL — ABNORMAL LOW (ref 8.9–10.3)
Chloride: 104 mmol/L (ref 98–111)
Creatinine: 0.69 mg/dL (ref 0.61–1.24)
GFR, Estimated: >60 mL/min (ref >60)
Glucose, Bld: 107 mg/dL — ABNORMAL HIGH (ref 70–99)
Potassium: 3.8 mmol/L (ref 3.5–5.1)
Sodium: 139 mmol/L (ref 135–145)
Total Bilirubin: 0.3 mg/dL (ref 0.0–1.2)
Total Protein: 6.7 g/dL (ref 6.5–8.1)

## 2023-10-30 LAB — PREPARE RBC (CROSSMATCH)

## 2023-10-30 LAB — ABO/RH: ABO/RH(D): A POS

## 2023-10-30 LAB — SAMPLE TO BLOOD BANK

## 2023-10-30 NOTE — Telephone Encounter (Signed)
 Per patient request, call made to [facility] requesting eye drop prescription be transferred to [facility] pharmacy. Unable to reach anyone. VM left with request.

## 2023-10-30 NOTE — Patient Instructions (Signed)

## 2023-10-30 NOTE — Progress Notes (Signed)
 HGB 7.7, MD aware. Pt to get 1 unit PRBC today and 1 unit PRBC tomorrow

## 2023-10-31 ENCOUNTER — Encounter (HOSPITAL_COMMUNITY): Payer: Self-pay

## 2023-10-31 ENCOUNTER — Encounter: Payer: Self-pay | Admitting: Hematology & Oncology

## 2023-10-31 ENCOUNTER — Other Ambulatory Visit: Payer: Self-pay

## 2023-10-31 ENCOUNTER — Other Ambulatory Visit (HOSPITAL_BASED_OUTPATIENT_CLINIC_OR_DEPARTMENT_OTHER): Payer: Self-pay

## 2023-10-31 ENCOUNTER — Inpatient Hospital Stay

## 2023-10-31 DIAGNOSIS — Z5112 Encounter for antineoplastic immunotherapy: Secondary | ICD-10-CM | POA: Diagnosis not present

## 2023-10-31 DIAGNOSIS — C7931 Secondary malignant neoplasm of brain: Secondary | ICD-10-CM

## 2023-10-31 MED ORDER — SODIUM CHLORIDE 0.9% FLUSH: 10.0000 mL | INTRAVENOUS | Status: DC | PRN

## 2023-10-31 MED ORDER — PREDNISOLONE ACETATE 1 % OP SUSP
OPHTHALMIC | 0 refills | Status: DC
  Filled 2023-10-31: qty 5, 15d supply, fill #0

## 2023-10-31 MED ORDER — SODIUM CHLORIDE 0.9% FLUSH
10.0000 mL | INTRAVENOUS | Status: AC | PRN
  Administered 2023-10-31: 10 mL

## 2023-10-31 MED ORDER — ACETAMINOPHEN 325 MG PO TABS
650.0000 mg | ORAL_TABLET | Freq: Once | ORAL | Status: AC
  Administered 2023-10-31: 650 mg via ORAL
  Filled 2023-10-31: qty 2

## 2023-10-31 MED ORDER — HEPARIN SOD (PORK) LOCK FLUSH 100 UNIT/ML IV SOLN
500.0000 [IU] | Freq: Every day | INTRAVENOUS | Status: AC | PRN
  Administered 2023-10-31: 500 [IU]

## 2023-10-31 MED ORDER — DIPHENHYDRAMINE HCL 25 MG PO CAPS
25.0000 mg | ORAL_CAPSULE | Freq: Once | ORAL | Status: AC
  Administered 2023-10-31: 25 mg via ORAL
  Filled 2023-10-31: qty 1

## 2023-10-31 MED ORDER — HEPARIN SOD (PORK) LOCK FLUSH 100 UNIT/ML IV SOLN: 250.0000 [IU] | INTRAVENOUS | Status: DC | PRN

## 2023-10-31 MED ORDER — SODIUM CHLORIDE 0.9% IV SOLUTION
250.0000 mL | INTRAVENOUS | Status: DC
  Administered 2023-10-31 (×2): 250 mL via INTRAVENOUS

## 2023-10-31 NOTE — Anesthesia Preprocedure Evaluation (Signed)
 Anesthesia Evaluation  Patient identified by MRN, date of birth, ID band Patient awake    Reviewed: Allergy & Precautions, NPO status , Patient's Chart, lab work & pertinent test results  History of Anesthesia Complications Negative for: history of anesthetic complications  Airway Mallampati: III  TM Distance: >3 FB Neck ROM: Full    Dental  (+) Dental Advisory Given   Pulmonary neg shortness of breath, former smoker   breath sounds clear to auscultation       Cardiovascular (-) angina (-) Past MI and (-) CHF  Rhythm:Regular     Neuro/Psych  PSYCHIATRIC DISORDERS  Depression     Neuromuscular disease    GI/Hepatic ,GERD  ,,  Endo/Other    Renal/GU Renal diseaseLab Results      Component                Value               Date                      NA                       139                 10/30/2023                K                        3.8                 10/30/2023                CO2                      25                  10/30/2023                GLUCOSE                  107 (H)             10/30/2023                BUN                      12                  10/30/2023                CREATININE               0.69                10/30/2023                CALCIUM                   8.5 (L)             10/30/2023                EGFR                     108                 12/14/2022  GFRNONAA                 >60                 10/30/2023                Musculoskeletal   Abdominal   Peds  Hematology  (+) Blood dyscrasia, anemia , HIVLab Results      Component                Value               Date                      WBC                      3.0 (L)             11/01/2023                HGB                      11.1 (L)            11/01/2023                HCT                      33.5 (L)            11/01/2023                MCV                      102.1 (H)           11/01/2023                 PLT                      79 (L)              11/01/2023              Anesthesia Other Findings History includes large cell neuroendocrine carcinoma of the left lung stage IV (s/p chemoradiation), HIV (diagnosed 05/2018, presented with Cryptococcal meningitis, 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 + Marijuana use.    Reproductive/Obstetrics                              Anesthesia Physical Anesthesia Plan  ASA: 4  Anesthesia Plan: General   Post-op Pain Management: Minimal or no pain anticipated   Induction: Intravenous  PONV Risk Score and Plan: 2 and Ondansetron  and Dexamethasone   Airway Management Planned: Oral ETT and LMA  Additional Equipment: None  Intra-op Plan:   Post-operative Plan: Extubation in OR  Informed Consent: I have reviewed the patients History and Physical, chart, labs and discussed the procedure including the risks, benefits and alternatives for the proposed anesthesia with the patient or authorized representative who has indicated his/her understanding and acceptance.     Dental advisory given  Plan Discussed with: CRNA  Anesthesia Plan Comments: (PAT note written 10/31/2023 by Hoa Briggs, PA-C.  )         Anesthesia Quick Evaluation

## 2023-10-31 NOTE — Patient Instructions (Signed)
 Blood Transfusion, Adult A blood transfusion is a procedure in which you receive blood or a type of blood cell (blood component) through an IV. You may need a blood transfusion when you have a low blood count, which is a low number of any blood cell. This may result from a bleeding disorder, illness, injury, or surgery. The blood may come from a donor, or you may be able to have your own blood collected and stored (autologous blood donation) before a planned surgery. The blood given in a transfusion may be made up of different blood components. You may receive: Red blood cells. These carry oxygen to the cells in the body. Platelets. These help your blood to clot. Plasma. This is the liquid part of your blood. It carries proteins and other substances throughout the body. White blood cells. These help you fight infections. If you have hemophilia or another clotting disorder, you may also receive other types of blood products. Depending on the type of blood product, this procedure may take 1-4 hours to complete. Tell a health care provider about: Any bleeding problems you have. Any previous reactions you have had during a blood transfusion. Any allergies you have. All medicines you are taking, including vitamins, herbs, eye drops, creams, and over-the-counter medicines. Any surgeries you have had. Any medical conditions you have. Whether you are pregnant or may be pregnant. What are the risks? Talk with your health care provider about risks. The most common problems include: A mild allergic reaction, such as red, swollen areas of skin (hives) and itching. Fever or chills. This may be the body's response to new blood cells received. This may occur during or up to 4 hours after the transfusion. More serious problems may include: A serious allergic reaction that causes difficulty breathing or swelling around the face and lips. Transfusion-associated circulatory overload (TACO), or too much fluid in  the lungs. This may cause breathing problems. Transfusion-related acute lung injury (TRALI), which causes breathing difficulty and low oxygen in the blood. This can occur within hours of the transfusion or several days later. Iron overload. This can happen after receiving many blood transfusions over a period of time. Infection or virus being transmitted. This is rare because donated blood is carefully tested before it is given. Hemolytic transfusion reaction. This is rare. It happens when the body's defense system (immune system)tries to attack the new blood cells. Symptoms may include fever, chills, nausea, low blood pressure, and low back or chest pain. Transfusion-associated graft-versus-host disease (TAGVHD). This is rare. It happens when donated cells attack the body's healthy tissues. What happens before the procedure? You will have a blood test to check your blood type. This test is done to know what kind of blood your body will accept and to match it to the donor blood. If you are going to have a planned surgery, you may be able to do an autologous blood donation. This may be done in case you need to have a transfusion. You will have your temperature, blood pressure, and pulse checked before the transfusion. If you have had an allergic reaction to a transfusion in the past, you may be given medicine to help prevent a reaction. This medicine may be given to you by mouth (orally) or through an IV. What happens during the procedure?  An IV will be inserted into one of your veins. The bag of blood will be attached to your IV. The blood will then enter through your vein. Your temperature, blood pressure,  and pulse will be monitored during the transfusion. This monitoring is done to detect early signs of a transfusion reaction. Tell your nurse right away if you have any of these symptoms during the transfusion: Shortness of breath or trouble breathing. Chest or back pain. Fever or  chills. Itching or hives. If you have any signs or symptoms of a reaction, your transfusion will be stopped and you may be given medicine. When the transfusion is complete, your IV will be removed. Pressure may be applied to the IV site for a few minutes. A bandage (dressing)will be applied. The procedure may vary among health care providers and hospitals. What happens after the procedure? Your temperature, blood pressure, pulse, breathing rate, and blood oxygen level will be monitored until you leave the hospital or clinic. Your blood may be tested to see how you have responded to the transfusion. You may be warmed with fluids or blankets to maintain a normal body temperature. If you receive your blood transfusion in an outpatient setting, you will be told whom to contact to report any reactions. Where to find more information Visit the [employer]: redcross.org Summary A blood transfusion is a procedure in which you receive blood or a type of blood cell (blood component) through an IV. The blood given in a transfusion may be made up of different blood components. You may receive red blood cells, platelets, plasma, or white blood cells depending on the condition treated. Your temperature, blood pressure, and pulse will be monitored before, during, and after the transfusion. After the transfusion, your blood may be tested to see how your body has responded. This information is not intended to replace advice given to you by your health care provider. Make sure you discuss any questions you have with your health care provider. Document Revised: 06/24/2021 Document Reviewed: 06/24/2021 Elsevier Patient Education  2024 [employer].

## 2023-10-31 NOTE — Progress Notes (Signed)
 Anesthesia Chart Review: Christopher Henson  Case: [id number] Date/Time: 11/01/23 1100   Procedure: MRI WITH ANESTHESIA   Anesthesia type: General   Pre-op diagnosis: Metastatic cancer to brain   [facility]   Surgeons: Radiologist, Medication, MD       DISCUSSION: Patient is a 43 year old male scheduled for the above procedure. He had office visit/H&P on 10/24/23 with Tonette Domino, PA-C with medical oncology. Brain MRI ordered for headaches with right eye pain without known injury. Possible brain mets on on 10/24/23 MRI but contrast was not administered due to the patient's inability to tolerate the entire exam.  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 + Marijuana use.   Current Therapy:    Zometa  4 mg IV every 3 months-next dose in 10/2023  Alimta /Avastin  -s/p cycle #5 - start on 07/11/2023  10/30/23 H/H 7.7/22.6. S/p 1 unit PRBC on 10/30/23 and is getting another 1 unit on 10/31/23. Consider repeat H/H on arrival in not rechecked post PRBC. Anesthesia team to evaluate on the day of surgery.    VS:  Wt Readings from Last 3 Encounters:  10/24/23 76.2 kg  10/21/23 74.4 kg  10/04/23 74.4 kg   BP Readings from Last 3 Encounters:  10/31/23 (!) 133/92  10/30/23 132/84  10/24/23 124/78   Pulse Readings from Last 3 Encounters:  10/31/23 68  10/30/23 80  10/24/23 89    PROVIDERS: Bulah Alm RAMAN, PA-C is PCP  Timmy Coy, MD is DOUGLASS Shannon Agent, MD is RAD-ONC Overton Faith, MD is ID   LABS: Most recent labs as of 10/30/23 in [facility] include: Lab Results  Component Value Date   WBC 3.9 (L) 10/30/2023   HGB 7.7 (L) 10/30/2023   HCT 22.6 (L) 10/30/2023   PLT 105 (L)  10/30/2023   GLUCOSE 107 (H) 10/30/2023   CHOL 171 12/18/2022   TRIG 142 12/14/2022   HDL 47 12/14/2022   LDLCALC 95 12/14/2022   ALT 54 (H) 10/30/2023   AST 37 10/30/2023   NA 139 10/30/2023   K 3.8 10/30/2023   CL 104 10/30/2023   CREATININE 0.69 10/30/2023   BUN 12 10/30/2023   CO2 25 10/30/2023   TSH 1.733 05/03/2023   INR 1.0 01/15/2023   HGBA1C 6.1 (A) 01/09/2023    IMAGES: MRI Brain 10/24/23: IMPRESSION: There are small foci of restricted diffusion, the largest of which measuring about 1 cm, in the medial right frontal lobe, left superior cerebellum, cerebellar vermis and right occipital lobe. These are most likely metastases. - Contrast was not administered due to the patient's inability to tolerate the entire exam.  1V PCXR 10/21/23: IMPRESSION: 1. No acute chest findings. 2. Small left pleural effusion with overlying scarring, atelectasis and slight volume loss. 3. The CT demonstrated a new pleural-based 1.3 cm solid nodule in the left apex which is probably still present but not well seen. It may be within the first anterior intercostal space and may or may not have changed in the interval. 4. Anterior mediastinal adenopathy.   CT Chest/abd/pelvis 08/02/23: IMPRESSION: 1. Interval mixed response to therapy. 2. Previously visualized subpleural left upper lobe pulmonary metastasis has resolved, however there are 2 new subpleural left pulmonary metastases. 3. Trace loculated basilar left pleural effusion is similar. Left pleural  thickening and enhancement is decreased. 4. Mediastinal and bilateral hilar nodal metastases are decreased in size. 5. Left adrenal metastasis is decreased in size. 6. Mild mixed changes in the right pelvic metastatic adenopathy as detailed. 7. Previously visualized lytic L1 and L2 vertebral metastases are newly sclerotic and stable size. 8. One vessel coronary atherosclerosis. 9. Aortic Atherosclerosis (ICD10-I70.0) and Emphysema  (ICD10-J43.9).    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 Lynwood Nasuti   History of radiation therapy    Left lung- 09/11/23-09/18/23- Dr. 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   Protein calorie malnutrition (HCC)    Small cell lung cancer, 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   Thrombocytopenia (HCC) 06/2018    Past Surgical History:  Procedure Laterality Date   BIOPSY  12/21/2022   Procedure: BIOPSY;  Surgeon: Neda Jennet LABOR, MD;  Location: [facility] END[facility];  Service: Endoscopy;;   BRONCHIAL WASHINGS  12/21/2022   Procedure: BRONCHIAL WASHINGS;  Surgeon: Neda Jennet LABOR, MD;  Location: [facility];  Service: Endoscopy;;   ENDOBRONCHIAL ULTRASOUND Left 12/21/2022   Procedure: ENDOBRONCHIAL ULTRASOUND;  Surgeon: Neda Jennet LABOR, MD;  Location: [facility];  Service: Endoscopy;  Laterality: Left;  Endobronchial ultrasound, biopsy, need fluoroscopy   FINE NEEDLE ASPIRATION  12/21/2022   Procedure: FINE NEEDLE ASPIRATION (FNA) LINEAR;  Surgeon: Neda Jennet LABOR, MD;  Location: [facility];  Service: Endoscopy;;   IR BONE MARROW BIOPSY & ASPIRATION  12/27/2022   IR IMAGING GUIDED PORT INSERTION  12/27/2022   IR LUMBAR PUNCTURE  12/22/2022   IR PERC PLEURAL DRAIN W/INDWELL CATH W/IMG GUIDE  01/15/2023   IR REMOVAL OF PLURAL CATH W/CUFF  03/27/2023   LUMBAR PUNCTURE  06/2018   during eval for meningitis    VIDEO BRONCHOSCOPY Left 12/21/2022   Procedure: VIDEO BRONCHOSCOPY WITH FLUORO, endobronchial ultrasound;  Surgeon: Neda Jennet LABOR, MD;  Location: [facility];  Service: Endoscopy;  Laterality: Left;  Schedule for 12/20/2022 12/21/2022    MEDICATIONS: No current  facility-administered medications for this encounter.    acetaminophen  (TYLENOL ) 325 MG tablet   bictegravir-emtricitabine -tenofovir  AF (BIKTARVY ) 50-200-25 MG TABS tablet   dexamethasone  (DECADRON ) 4 MG tablet   folic acid  (FOLVITE ) 1 MG tablet   lidocaine -prilocaine  (EMLA ) cream   ondansetron  (ZOFRAN ) 8 MG tablet   oxyCODONE  (OXY IR/ROXICODONE ) 5 MG immediate release tablet   oxyCODONE  (OXYCONTIN ) 15 mg 12 hr tablet   prednisoLONE  acetate (PRED FORTE ) 1 % ophthalmic suspension   prochlorperazine  (COMPAZINE ) 10 MG tablet   senna-docusate (SENOKOT-S) 8.6-50 MG tablet   folic acid  (FOLVITE ) 1 MG tablet   megestrol  (MEGACE  ES) 625 MG/5ML suspension   megestrol  (MEGACE ) 400 MG/10ML suspension   OXYGEN    prednisoLONE  acetate (PRED FORTE ) 1 % ophthalmic suspension    0.9 %  sodium chloride  infusion (Manually program via Guardrails IV Fluids)   heparin  lock flush 100 unit/mL   sodium chloride  flush (NS) 0.9 % injection 10 mL    Isaiah Ruder, PA-C Surgical Short Stay/Anesthesiology [facility] Phone [phone] [facility] Phone [phone] 10/31/2023 12:27 PM

## 2023-10-31 NOTE — Progress Notes (Signed)
 SDW call  Patient was given pre-op instructions over the phone. Patient verbalized understanding of instructions provided.     PCP - Alm Gent, PA-C Oncologist: Lauraine Dais, PA-C Cardiologist -  Pulmonary:    PPM/ICD - denies Device Orders - na Rep Notified - na   Chest x-ray - 10/21/2023 EKG -  10/21/2023 Stress Test - ECHO -  Cardiac Cath -   Sleep Study/sleep apnea/CPAP: denies  Non-diabetic  Blood Thinner Instructions: denies Aspirin  Instructions:denies   ERAS Protcol - Clears until 0815   Anesthesia review: Yes. ETOH abuse, pt denied in SDW call, AIDS, thrombocytopenia, metastatic CA to brain. Patients hemaglobin 7.7 10/30/2023. Received one unit PRBC, scheduled to receive a second unit today, 10/31/2023   Patient denies shortness of breath, fever, cough and chest pain over the phone call  Your procedure is scheduled on Thursday November 01, 2023  Report to [facility] Main Entrance A at  0845  A.M., then check in with the Admitting office.  Call this number if you have problems the morning of surgery:  [phone]   If you have any questions prior to your surgery date call [phone]: Open Monday-Friday 8am-4pm If you experience any cold or flu symptoms such as cough, fever, chills, shortness of breath, etc. between now and your scheduled surgery, please notify us  at the above number    Remember:  Do not eat after midnight the night before your surgery  You may drink clear liquids until  0815 the morning of your surgery.   Clear liquids allowed are: Water, Non-Citrus Juices (without pulp), Carbonated Beverages, Clear Tea, Black Coffee ONLY (NO MILK, CREAM OR POWDERED CREAMER of any kind), and Gatorade   Take these medicines the morning of surgery with A SIP OF WATER:  Biktarvy , decadron , pred-forte eye drops  As needed: Tylenol , zofran , oxycodone , compazine   As of today, STOP taking any Aspirin  (unless otherwise instructed by your surgeon) Aleve,  Naproxen, Ibuprofen , Motrin , Advil , Goody's, BC's, all herbal medications, fish oil, and all vitamins.

## 2023-11-01 ENCOUNTER — Encounter (HOSPITAL_COMMUNITY): Payer: Self-pay | Admitting: Internal Medicine

## 2023-11-01 ENCOUNTER — Ambulatory Visit (HOSPITAL_BASED_OUTPATIENT_CLINIC_OR_DEPARTMENT_OTHER): Payer: Self-pay | Admitting: Vascular Surgery

## 2023-11-01 ENCOUNTER — Ambulatory Visit (HOSPITAL_COMMUNITY)
Admission: RE | Admit: 2023-11-01 | Discharge: 2023-11-01 | Disposition: A | Discharge status: Home / Self Care | Attending: Internal Medicine | Admitting: Internal Medicine

## 2023-11-01 ENCOUNTER — Ambulatory Visit (HOSPITAL_COMMUNITY)
Admission: RE | Admit: 2023-11-01 | Discharge: 2023-11-01 | Disposition: A | Discharge status: Home / Self Care | Source: Ambulatory Visit | Attending: Internal Medicine | Admitting: Internal Medicine

## 2023-11-01 ENCOUNTER — Ambulatory Visit (HOSPITAL_COMMUNITY): Payer: Self-pay | Admitting: Vascular Surgery

## 2023-11-01 ENCOUNTER — Encounter (HOSPITAL_COMMUNITY)
Admission: RE | Disposition: A | Discharge status: Home / Self Care | Payer: Self-pay | Source: Home / Self Care | Attending: Internal Medicine

## 2023-11-01 DIAGNOSIS — Z87891 Personal history of nicotine dependence: Secondary | ICD-10-CM

## 2023-11-01 DIAGNOSIS — C7931 Secondary malignant neoplasm of brain: Secondary | ICD-10-CM | POA: Insufficient documentation

## 2023-11-01 DIAGNOSIS — C801 Malignant (primary) neoplasm, unspecified: Secondary | ICD-10-CM

## 2023-11-01 DIAGNOSIS — Z21 Asymptomatic human immunodeficiency virus [HIV] infection status: Secondary | ICD-10-CM | POA: Diagnosis not present

## 2023-11-01 DIAGNOSIS — Z9221 Personal history of antineoplastic chemotherapy: Secondary | ICD-10-CM | POA: Diagnosis not present

## 2023-11-01 DIAGNOSIS — Z79899 Other long term (current) drug therapy: Secondary | ICD-10-CM | POA: Insufficient documentation

## 2023-11-01 DIAGNOSIS — I10 Essential (primary) hypertension: Secondary | ICD-10-CM

## 2023-11-01 DIAGNOSIS — Z923 Personal history of irradiation: Secondary | ICD-10-CM | POA: Insufficient documentation

## 2023-11-01 DIAGNOSIS — D630 Anemia in neoplastic disease: Secondary | ICD-10-CM | POA: Insufficient documentation

## 2023-11-01 DIAGNOSIS — R9082 White matter disease, unspecified: Secondary | ICD-10-CM | POA: Diagnosis not present

## 2023-11-01 DIAGNOSIS — C719 Malignant neoplasm of brain, unspecified: Secondary | ICD-10-CM | POA: Diagnosis not present

## 2023-11-01 DIAGNOSIS — C7A8 Other malignant neuroendocrine tumors: Secondary | ICD-10-CM | POA: Insufficient documentation

## 2023-11-01 HISTORY — PX: RADIOLOGY WITH ANESTHESIA: SHX6223

## 2023-11-01 LAB — TYPE AND SCREEN
ABO/RH(D): A POS
Antibody Screen: NEGATIVE
Unit division: 0
Unit division: 0

## 2023-11-01 LAB — CBC
HCT: 33.5 % — ABNORMAL LOW (ref 39.0–52.0)
Hemoglobin: 11.1 g/dL — ABNORMAL LOW (ref 13.0–17.0)
MCH: 33.8 pg (ref 26.0–34.0)
MCHC: 33.1 g/dL (ref 30.0–36.0)
MCV: 102.1 fL — ABNORMAL HIGH (ref 80.0–100.0)
Platelets: 79 K/uL — ABNORMAL LOW (ref 150–400)
RBC: 3.28 MIL/uL — ABNORMAL LOW (ref 4.22–5.81)
RDW: 20.8 % — ABNORMAL HIGH (ref 11.5–15.5)
WBC: 3 K/uL — ABNORMAL LOW (ref 4.0–10.5)
nRBC: 5.1 % — ABNORMAL HIGH (ref 0.0–0.2)

## 2023-11-01 LAB — BPAM RBC
Blood Product Expiration Date: 202508102359
Blood Product Expiration Date: 202508102359
ISSUE DATE / TIME: 202507230735
ISSUE DATE / TIME: 202507230735
Unit Type and Rh: 6200
Unit Type and Rh: 6200

## 2023-11-01 SURGERY — MRI WITH ANESTHESIA
Anesthesia: General

## 2023-11-01 MED ORDER — ORAL CARE MOUTH RINSE
15.0000 mL | Freq: Once | OROMUCOSAL | Status: AC
Start: 2023-11-01 — End: 2023-11-01

## 2023-11-01 MED ORDER — CHLORHEXIDINE GLUCONATE 0.12 % MT SOLN
15.0000 mL | Freq: Once | OROMUCOSAL | Status: AC
  Administered 2023-11-01: 15 mL via OROMUCOSAL
  Filled 2023-11-01: qty 15

## 2023-11-01 MED ORDER — GADOBUTROL 1 MMOL/ML IV SOLN
7.5000 mL | Freq: Once | INTRAVENOUS | Status: AC | PRN
  Administered 2023-11-01: 7.5 mL via INTRAVENOUS

## 2023-11-01 MED ORDER — LACTATED RINGERS IV SOLN: INTRAVENOUS | Status: DC

## 2023-11-01 MED ORDER — ONDANSETRON HCL 4 MG/2ML IJ SOLN
INTRAMUSCULAR | Status: DC | PRN
  Administered 2023-11-01: 4 mg via INTRAVENOUS

## 2023-11-01 MED ORDER — PROPOFOL 10 MG/ML IV BOLUS
INTRAVENOUS | Status: DC | PRN
  Administered 2023-11-01: 200 mg via INTRAVENOUS

## 2023-11-01 MED ORDER — LIDOCAINE 2% (20 MG/ML) 5 ML SYRINGE
INTRAMUSCULAR | Status: DC | PRN
  Administered 2023-11-01: 100 mg via INTRAVENOUS

## 2023-11-01 NOTE — Transfer of Care (Signed)
 Immediate Anesthesia Transfer of Care Note  Patient: Christopher Henson  Procedure(s) Performed: MRI WITH ANESTHESIA  Patient Location: PACU  Anesthesia Type:General  Level of Consciousness: awake, alert , and oriented  Airway & Oxygen  Therapy: Patient Spontanous Breathing and Patient connected to nasal cannula oxygen   Post-op Assessment: Report given to RN and Post -op Vital signs reviewed and stable  Post vital signs: Reviewed and stable  Last Vitals:  Vitals Value Taken Time  BP 175/92 11/01/23 13:35  Temp 36.6 C 11/01/23 13:35  Pulse 59 11/01/23 13:37  Resp 22 11/01/23 13:39  SpO2 96 % 11/01/23 13:37  Vitals shown include unfiled device data.  Last Pain:  Vitals:   11/01/23 0956  TempSrc:   PainSc: 5       Patients Stated Pain Goal: 1 (11/01/23 0956)  Complications: No notable events documented.

## 2023-11-01 NOTE — Anesthesia Procedure Notes (Signed)
 Procedure Name: LMA Insertion Date/Time: 11/01/2023 12:19 PM  Performed by: Lockie Flesher, CRNAPre-anesthesia Checklist: Patient identified, Emergency Drugs available, Suction available and Patient being monitored Patient Re-evaluated:Patient Re-evaluated prior to induction Oxygen  Delivery Method: Circle System Utilized Preoxygenation: Pre-oxygenation with 100% oxygen  Induction Type: IV induction Ventilation: Mask ventilation without difficulty LMA: LMA inserted LMA Size: 4.0 Number of attempts: 1 Airway Equipment and Method: Bite block Placement Confirmation: positive ETCO2 Tube secured with: Tape Dental Injury: Teeth and Oropharynx as per pre-operative assessment

## 2023-11-01 NOTE — Anesthesia Postprocedure Evaluation (Signed)
 Anesthesia Post Note  Patient: Christopher Henson  Procedure(s) Performed: MRI WITH ANESTHESIA     Patient location during evaluation: PACU Anesthesia Type: General Level of consciousness: awake and alert Pain management: pain level controlled Vital Signs Assessment: post-procedure vital signs reviewed and stable Respiratory status: spontaneous breathing, nonlabored ventilation, respiratory function stable and patient connected to nasal cannula oxygen  Cardiovascular status: blood pressure returned to baseline and stable Postop Assessment: no apparent nausea or vomiting Anesthetic complications: no   No notable events documented.  Last Vitals:  Vitals:   11/01/23 1400 11/01/23 1405  BP: (!) 159/97 (!) 159/96  Pulse: 62 64  Resp: 15 18  Temp: 36.6 C   SpO2: 99% 99%    Last Pain:  Vitals:   11/01/23 1400  TempSrc:   PainSc: Asleep                 Garnette DELENA Gab

## 2023-11-02 ENCOUNTER — Emergency Department (HOSPITAL_COMMUNITY)

## 2023-11-02 ENCOUNTER — Other Ambulatory Visit: Payer: Self-pay

## 2023-11-02 ENCOUNTER — Encounter (HOSPITAL_COMMUNITY): Payer: Self-pay | Admitting: Emergency Medicine

## 2023-11-02 ENCOUNTER — Emergency Department (HOSPITAL_COMMUNITY)
Admission: EM | Admit: 2023-11-02 | Discharge: 2023-11-02 | Disposition: A | Discharge status: Home / Self Care | Attending: Emergency Medicine | Admitting: Emergency Medicine

## 2023-11-02 DIAGNOSIS — R0789 Other chest pain: Secondary | ICD-10-CM | POA: Insufficient documentation

## 2023-11-02 DIAGNOSIS — B451 Cerebral cryptococcosis: Secondary | ICD-10-CM | POA: Diagnosis not present

## 2023-11-02 DIAGNOSIS — C3492 Malignant neoplasm of unspecified part of left bronchus or lung: Secondary | ICD-10-CM | POA: Diagnosis not present

## 2023-11-02 DIAGNOSIS — J9859 Other diseases of mediastinum, not elsewhere classified: Secondary | ICD-10-CM | POA: Diagnosis not present

## 2023-11-02 DIAGNOSIS — R0602 Shortness of breath: Secondary | ICD-10-CM | POA: Diagnosis not present

## 2023-11-02 DIAGNOSIS — J90 Pleural effusion, not elsewhere classified: Secondary | ICD-10-CM | POA: Diagnosis not present

## 2023-11-02 DIAGNOSIS — R079 Chest pain, unspecified: Secondary | ICD-10-CM | POA: Diagnosis not present

## 2023-11-02 DIAGNOSIS — R918 Other nonspecific abnormal finding of lung field: Secondary | ICD-10-CM | POA: Diagnosis not present

## 2023-11-02 LAB — URINALYSIS, ROUTINE W REFLEX MICROSCOPIC
Bilirubin Urine: NEGATIVE
Glucose, UA: NEGATIVE mg/dL
Hgb urine dipstick: NEGATIVE
Ketones, ur: NEGATIVE mg/dL
Leukocytes,Ua: NEGATIVE
Nitrite: NEGATIVE
Protein, ur: NEGATIVE mg/dL
Specific Gravity, Urine: 1.013 (ref 1.005–1.030)
pH: 5 (ref 5.0–8.0)

## 2023-11-02 LAB — COMPREHENSIVE METABOLIC PANEL WITH GFR
ALT: 62 U/L — ABNORMAL HIGH (ref 0–44)
AST: 37 U/L (ref 15–41)
Albumin: 3.3 g/dL — ABNORMAL LOW (ref 3.5–5.0)
Alkaline Phosphatase: 81 U/L (ref 38–126)
Anion gap: 8 (ref 5–15)
BUN: 18 mg/dL (ref 6–20)
CO2: 27 mmol/L (ref 22–32)
Calcium: 9 mg/dL (ref 8.9–10.3)
Chloride: 103 mmol/L (ref 98–111)
Creatinine, Ser: 0.97 mg/dL (ref 0.61–1.24)
GFR, Estimated: >60 mL/min (ref >60)
Glucose, Bld: 95 mg/dL (ref 70–99)
Potassium: 3.7 mmol/L (ref 3.5–5.1)
Sodium: 138 mmol/L (ref 135–145)
Total Bilirubin: 1 mg/dL (ref 0.0–1.2)
Total Protein: 7 g/dL (ref 6.5–8.1)

## 2023-11-02 LAB — CBC WITH DIFFERENTIAL/PLATELET
Abs Immature Granulocytes: 0.07 K/uL (ref 0.00–0.07)
Basophils Absolute: 0 K/uL (ref 0.0–0.1)
Basophils Relative: 0 %
Eosinophils Absolute: 0 K/uL (ref 0.0–0.5)
Eosinophils Relative: 1 %
HCT: 35.5 % — ABNORMAL LOW (ref 39.0–52.0)
Hemoglobin: 11.8 g/dL — ABNORMAL LOW (ref 13.0–17.0)
Immature Granulocytes: 3 %
Lymphocytes Relative: 28 %
Lymphs Abs: 0.8 K/uL (ref 0.7–4.0)
MCH: 33.8 pg (ref 26.0–34.0)
MCHC: 33.2 g/dL (ref 30.0–36.0)
MCV: 101.7 fL — ABNORMAL HIGH (ref 80.0–100.0)
Monocytes Absolute: 0.6 K/uL (ref 0.1–1.0)
Monocytes Relative: 21 %
Neutro Abs: 1.3 K/uL — ABNORMAL LOW (ref 1.7–7.7)
Neutrophils Relative %: 47 %
Platelets: 59 K/uL — ABNORMAL LOW (ref 150–400)
RBC: 3.49 MIL/uL — ABNORMAL LOW (ref 4.22–5.81)
RDW: 21 % — ABNORMAL HIGH (ref 11.5–15.5)
WBC: 2.7 K/uL — ABNORMAL LOW (ref 4.0–10.5)
nRBC: 4 % — ABNORMAL HIGH (ref 0.0–0.2)

## 2023-11-02 LAB — LIPASE, BLOOD: Lipase: 26 U/L (ref 11–51)

## 2023-11-02 LAB — BRAIN NATRIURETIC PEPTIDE: B Natriuretic Peptide: 42.5 pg/mL (ref 0.0–100.0)

## 2023-11-02 LAB — TROPONIN I (HIGH SENSITIVITY): Troponin I (High Sensitivity): 5 ng/L (ref <18)

## 2023-11-02 NOTE — ED Provider Triage Note (Signed)
 Emergency Medicine Provider Triage Evaluation Note  Christopher Henson , a 43 y.o. male  was evaluated in triage.  Pt complains of CP/SHOB/abd pain since this morning  Review of Systems  Positive: SHOB, intermittent CP, lower abd pain, nausea Negative: Fever, chills, vomiting, cough, congestion  Physical Exam  BP (!) 126/93 (BP Location: Right Arm)   Pulse 74   Temp 97.9 F (36.6 C) (Oral)   Resp 16   SpO2 100%  Gen:   Awake, no distress   Resp:  Normal effort  MSK:   Moves extremities without difficulty  Other:    Medical Decision Making  Medically screening exam initiated at 9:49 AM.  Appropriate orders placed.  Christopher Henson was informed that the remainder of the evaluation will be completed by another provider, this initial triage assessment does not replace that evaluation, and the importance of remaining in the ED until their evaluation is complete.  Labs and imaging ordered   Christopher Henson 11/02/23 [id number]

## 2023-11-02 NOTE — ED Triage Notes (Signed)
 Pt came from home, c/o sharp intermittent chest pain, nausea, and SOB. Took OxyContin  and Excedrin with no relief. Hx of lung cancer.

## 2023-11-02 NOTE — Discharge Instructions (Signed)
 Follow-up with your oncologist next week.  Return sooner for problems

## 2023-11-04 NOTE — Progress Notes (Signed)
 Radiation Oncology         [phone] ________________________________  Name: Christopher Henson MRN: [id number]  Date: 11/05/2023  DOB: April 10, 1981  Follow-Up Visit Note  CC: Bulah Alm RAMAN, PA-C  Tysinger, Alm RAMAN, PA-C  No diagnosis found.  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 He completed palliative radiation to these sites on 07/06/2023. He presented in May of 2025 with progressive disease within the left lung; s/p SBRT completed on 09/18/2023 - Now with recent evidence of intracranial metastatic disease noted in July 2025   Interval Since Last Radiation: 1 month and 18 days    2)  Intent: Palliative Radiation Treatment Dates: First Treatment Date: 2023-09-11 -- Last Treatment Date: 2023-09-18 Site/Dose/Technique/Mode:  Plan Name: Lung_LUL_SBRT Site: Lung, Left Technique: SBRT/SRT-IMRT Mode: Photon Dose Per Fraction: 18 Gy Prescribed Dose (Delivered / Prescribed): 54 Gy / 54 Gy Prescribed Fxs (Delivered / Prescribed): 3 / 3   Plan Name: Lung_LLL_SBRT Site: Lung, Left Technique: SBRT/SRT-IMRT Mode: Photon Dose Per Fraction: 18 Gy Prescribed Dose (Delivered / Prescribed): 54 Gy / 54 Gy Prescribed Fxs (Delivered / Prescribed): 3 / 3   1)  Intent: Curative  Radiation Treatment Dates: First Treatment Date: 2023-06-18 -- Last Treatment Date: 2023-07-06 Site/Dose/Technique/Mode:  Plan Name: Lung_L Site: Lung, Left Technique: 3D Mode: Photon Dose Per Fraction: 2.5 Gy Prescribed Dose (Delivered / Prescribed): 37.5 Gy / 37.5 Gy Prescribed Fxs (Delivered / Prescribed): 15 / 15   Plan Name: Abd Site: Abdomen Technique: 3D Mode: Photon Dose Per Fraction: 2.5 Gy Prescribed Dose (Delivered / Prescribed): 35 Gy / 35 Gy Prescribed Fxs (Delivered / Prescribed): 14 / 14  Narrative:  The patient returns today for routine follow-up. He tolerated radiation  therapy relatively well overall without any significant side effects.    Since his first radiation treatment on 05/22, he received his 5th cycle of systemic therapy on 10/03/22. He seems to have tolerated chemotherapy well thus far without any significant side effects reported. He was however noted to have lost some weight during a visit with Dr. Timmy on 10/03/23. Dr. Timmy would like to try him on megace  for this.    He was also seen in the ED on 07/13 due to sudden onset left sided chest pain. His pain apparently improved without intervention in the ED and his troponin came back normal. He also endorsed some dizziness which he attributed to not eating and drinking well. He was subsequently given IV fluids and was encouraged to eat and drink well.   In most recent history, he presented to medical oncology on 10/24/23 with ongoing right eye pain and visual changes. An MRI of the brain was subsequently performed that day which demonstrated several small foci of restricted diffusion concerning for intracranial metastatic disease, the largest of which measuring about 1 cm, in the medial right frontal lobe, left superior cerebellum, cerebellar vermis, and right occipital lobe.   A repeat MRI of the brain without was subsequently performed on 07/19 (limited due to premature termination) which demonstrated: 4 foci of diffusion-weighted signal abnormality within the supratentorial and infratentorial brain measuring up to 10 mm in the greatest extent, highly suspicious for intracranial metastases. A possible additional lesion was also demonstrated within the inferior left cerebellar hemisphere (differential considerations include an additional metastatic deposit vs imaging artifact).     Given that he did not tolerate his prior MRI's  well, he was admitted for an MRI of the brain under anesthesia on 11/01/23 which further demonstrated: six intracranial metastatic lesions within the supratentorial and  infratentorial brain measuring up to 8 mm the greatest extent; mild associated edema; and two indeterminate additional small foci of enhancement along the right frontal and right parietal lobes   ***  Allergies:  has no known allergies.  Meds: Current Outpatient Medications  Medication Sig Dispense Refill   acetaminophen  (TYLENOL ) 325 MG tablet Take 2 tablets (650 mg total) by mouth every 6 (six) hours as needed.     bictegravir-emtricitabine -tenofovir  AF (BIKTARVY ) 50-200-25 MG TABS tablet Take 1 tablet by mouth daily. 30 tablet 11   dexamethasone  (DECADRON ) 4 MG tablet Take 20 mg by mouth today then take 12 mg by mouth once daily thereafter 92 tablet 1   folic acid  (FOLVITE ) 1 MG tablet Take 1 tablet (1 mg total) by mouth daily. Start 7 days before pemetrexed  chemotherapy. Continue until 21 days after pemetrexed  completed. 100 tablet 3   folic acid  (FOLVITE ) 1 MG tablet Take 2 tablets (2 mg total) by mouth daily. 60 tablet 3   lidocaine -prilocaine  (EMLA ) cream Apply to affected area once 30 g 3   megestrol  (MEGACE  ES) 625 MG/5ML suspension Take 5 mLs (625 mg total) by mouth daily. (Patient not taking: Reported on 10/30/2023) 150 mL 4   megestrol  (MEGACE ) 400 MG/10ML suspension Take 10 mLs (400 mg total) by mouth 2 (two) times daily. (Patient not taking: Reported on 10/30/2023) 240 mL 1   ondansetron  (ZOFRAN ) 8 MG tablet Take 1 tablet (8 mg total) by mouth every 8 (eight) hours as needed for nausea or vomiting. 30 tablet 1   oxyCODONE  (OXY IR/ROXICODONE ) 5 MG immediate release tablet Take 1 tablet (5 mg total) by mouth every 6 (six) hours as needed. 60 tablet 0   oxyCODONE  (OXYCONTIN ) 15 mg 12 hr tablet Take 1 tablet (15 mg total) by mouth every 12 (twelve) hours. 60 tablet 0   OXYGEN  Inhale 2 L into the lungs daily as needed.     prednisoLONE  acetate (PRED FORTE ) 1 % ophthalmic suspension Place 1 drop into the right eye See admin instructions. 1 drop into right eye every hour while awake today,  then every 2 hours while awake tomorrow and thereafter (e.g. 6-7 times per day)     prednisoLONE  acetate (PRED FORTE ) 1 % ophthalmic suspension Instill 1 (one) drop into RIGHT eye every hour while awake today, then every 2 (two) hours while awake tomorrow and thereafter (e.g. 6-7 times per day) 5 mL 0   prochlorperazine  (COMPAZINE ) 10 MG tablet Take 1 tablet (10 mg total) by mouth every 6 (six) hours as needed for nausea or vomiting. 30 tablet 1   senna-docusate (SENOKOT-S) 8.6-50 MG tablet Take 1 tablet by mouth 2 (two) times daily between meals as needed for mild constipation.     No current facility-administered medications for this encounter.    Physical Findings: The patient is in no acute distress. Patient is alert and oriented.  vitals were not taken for this visit. .  No significant changes. Lungs are clear to auscultation bilaterally. Heart has regular rate and rhythm. No palpable cervical, supraclavicular, or axillary adenopathy. Abdomen soft, non-tender, normal bowel sounds.   Lab Findings: Lab Results  Component Value Date   WBC 2.7 (L) 11/02/2023   HGB 11.8 (L) 11/02/2023   HCT 35.5 (L) 11/02/2023   MCV 101.7 (H) 11/02/2023   PLT 59 (L) 11/02/2023  Radiographic Findings: DG Chest 2 View Result Date: 11/02/2023 CLINICAL DATA:  Chest pain, shortness of breath. EXAM: CHEST - 2 VIEW COMPARISON:  October 21, 2023.  August 02, 2023. FINDINGS: Stable cardiomediastinal silhouette. Stable left suprahilar prominence is noted concerning for adenopathy as noted on prior exam. Left perihilar atelectasis or scarring is noted. Minimal left pleural effusion is noted with associated atelectasis or scarring. Right-sided Port-A-Cath is unchanged. Right lung is clear. IMPRESSION: Stable left suprahilar prominence is noted concerning for adenopathy as noted on prior CT scan. Left perihilar atelectasis or scarring is noted. Minimal left pleural effusion is noted with associated left basilar atelectasis  or scarring. Electronically Signed   By: Lynwood Landy Raddle M.D.   On: 11/02/2023 10:18   MR Brain W Wo Contrast Result Date: 11/01/2023 CLINICAL DATA:  Provided history: Metastatic cancer to brain. Brain/CNS neoplasm, assess treatment response. EXAM: MRI HEAD WITHOUT AND WITH CONTRAST TECHNIQUE: Multiplanar, multiecho pulse sequences of the brain and surrounding structures were obtained without and with intravenous contrast. CONTRAST:  7.5mL GADAVIST  GADOBUTROL  1 MMOL/ML IV SOLN COMPARISON:  Brain MRI 10/27/2023. FINDINGS: Brain: Multiple enhancing intracranial lesions consistent with metastases as follows: 2-3 mm lesion along the medial right frontal and right parietal lobes (series 1200, image 249). 3 mm lesion within the right cingulate gyrus anteriorly (series 1200, image 215) (series 12, image 184). 8 mm lesion within the right occipital lobe (series 1200, image 161). 6 mm lesion within the superior cerebellar vermis (series 1200, image 176). 8 mm lesion within the superomedial left cerebellar hemisphere (series 1200, image 165). 4 mm lesion within the inferior left cerebellar hemisphere (series 1200, image 128). No more than mild edema associated with the above described lesions. Additionally, there is a 3 mm focus of enhancement along the right parietal lobe which is indeterminate for vascular enhancement versus a metastasis (series 1200, image 225) (series 12, image 221). Background nonspecific cerebral white matter disease, unchanged. Punctate chronic microhemorrhage within the right thalamus. There is no acute infarct. No extra-axial fluid collection. No midline shift. Vascular: Maintained flow voids within the proximal large arterial vessels. Skull and upper cervical spine: No focal worrisome marrow lesion. Sinuses/Orbits: No mass or acute finding within the imaged orbits. No significant paranasal sinus disease. Other: Small-volume fluid within bilateral mastoid air cells. IMPRESSION: 1. Six metastases  identified within the supratentorial and infratentorial brain, as described and measuring up to 8 mm. No more than mild edema associated with these lesions. 2. Two additional small foci of enhancement along the right frontal and right parietal lobes, as described and indeterminate for vascular enhancement versus additional metastases. Attention recommended on MRI follow-up. Electronically Signed   By: Rockey Childs D.O.   On: 11/01/2023 14:47   MR BRAIN WO CONTRAST (CHARM STUDY) Result Date: 10/27/2023 CLINICAL DATA:  Provided history: Large-cell neuroendocrine carcinoma. Metastatic cancer to brain. Brain metastases. EXAM: MRI HEAD WITHOUT CONTRAST TECHNIQUE: Multiplanar, multiecho pulse sequences of the brain and surrounding structures were obtained without intravenous contrast. COMPARISON:  Brain MRI 10/24/2023. FINDINGS: The patient was unable to tolerate the full examination. The following sequences were acquired: Axial diffusion-weighted, sagittal T1, axial FLAIR. Within this limitation, findings are as follows. Brain: Foci of diffusion-weighted signal abnormality within the medial right frontoparietal cortex (series 3, image 46), within the right occipital lobe (series 3, image 20), within the superior cerebellar vermis (series 3, image 25) and more laterally within the superior left cerebellar hemisphere (series 3, image 22). An additional small lesion is questioned  within the inferior left cerebellar hemisphere (versus artifact) (series 3, image 10). Findings are unchanged from the recent prior brain MRI of 10/24/2023. As before, these lesions measure up to 10 mm and are highly suspicious for intracranial metastases. Unchanged background nonspecific chronic cerebral white matter disease, which is nonspecific. No extra-axial fluid collection. No midline shift. Vascular: Flow voids not assessed in the absence of T2 imaging. Skull and upper cervical spine: No focal worrisome marrow lesion identified.  Sinuses/Orbits: Unremarkable. IMPRESSION: 1. Prematurely terminated, non-contrast examination as described. Within this limitation, findings are as follows. 2. Four foci of diffusion-weighted signal abnormality within the supratentorial and infratentorial brain, as described and highly suspicious for intracranial metastases. An additional lesion is questioned within the inferior left cerebellar hemisphere (versus artifact). Findings are unchanged from the recent prior brain MRI of 10/24/2023. As before, these lesions measure up to 10 mm and are highly suspicious for intracranial metastases (although some could reflect recent infarcts). Post-contrast MR imaging of the brain is again recommended for further evaluation. Electronically Signed   By: Rockey Childs D.O.   On: 10/27/2023 12:32   MR BRAIN WO CONTRAST Result Date: 10/25/2023 CLINICAL DATA:  Visual disturbance, right eye pain, history of large cell neuroendocrine tumor EXAM: MRI HEAD WITHOUT CONTRAST TECHNIQUE: Multiplanar, multiecho pulse sequences of the brain and surrounding structures were obtained without intravenous contrast. COMPARISON:  December 24, 2022 FINDINGS: MRI brain: There are areas of T2 hyperintensity in the white matter adjacent to the frontal horns, and in the subcortical white matter of the left frontal lobe which are unchanged. There are new foci of restricted diffusion in the medial right frontal lobe, left superior cerebellum, cerebellar vermis and right occipital. The largest of these is in the occipital lobe and measures about 1 cm. There is no acute or chronic infarct. The ventricles are normal. No mass lesion. There are normal flow signals in the carotid arteries and basilar artery. No significant bone marrow signal abnormality. No significant abnormality in the paranasal sinuses or soft tissues. IMPRESSION: There are small foci of restricted diffusion, the largest of which measuring about 1 cm, in the medial right frontal lobe,  left superior cerebellum, cerebellar vermis and right occipital lobe. These are most likely metastases. Contrast was not administered due to the patient's inability to tolerate the entire exam. Electronically Signed   By: Nancyann Burns M.D.   On: 10/25/2023 12:05   DG Chest Port 1 View Result Date: 10/21/2023 CLINICAL DATA:  [id number]. Chest pain. Dizziness. History of lung cancer. Last chemotherapy 3 weeks ago. EXAM: PORTABLE CHEST 1 VIEW COMPARISON:  Chest CT with contrast 08/02/2023. FINDINGS: A small left pleural effusion is unchanged. This appeared loculated in the dorsal chest base on the CT. There is overlying linear scarring, atelectasis and slight volume loss. The CT demonstrated a new pleural-based 1.3 cm solid nodule in the left apex which is probably still present but not well seen. It may be within the first anterior intercostal space and may or may not have changed in the interval. The rest of the lungs are clear. Right IJ port catheter terminates at the superior cavoatrial junction. The cardiac size is normal. The mediastinum appears unchanged. A double density adjacent the proximal aortic shadow is consistent with the anterior mediastinal adenopathy seen on CT. No new osseous abnormality is seen portably. Overlying monitor wires. IMPRESSION: 1. No acute chest findings. 2. Small left pleural effusion with overlying scarring, atelectasis and slight volume loss. 3. The CT demonstrated a  new pleural-based 1.3 cm solid nodule in the left apex which is probably still present but not well seen. It may be within the first anterior intercostal space and may or may not have changed in the interval. 4. Anterior mediastinal adenopathy. Electronically Signed   By: Francis Quam M.D.   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 He completed palliative radiation to these sites  on 07/06/2023. He presented in May of 2025 with progressive disease within the left lung; s/p SBRT completed on 09/18/2023 - Now with recent evidence of intracranial metastatic disease noted in July 2025  The patient is recovering from the effects of radiation.  ***  Plan:  ***   *** minutes of total time was spent for this patient encounter, including preparation, face-to-face counseling with the patient and coordination of care, physical exam, and documentation of the encounter. ____________________________________  Lynwood CHARM Nasuti, PhD, MD  This document serves as a record of services personally performed by Lynwood Nasuti, MD. It was created on his behalf by Dorthy Fuse, a trained medical scribe. The creation of this record is based on the scribe's personal observations and the provider's statements to them. This document has been checked and approved by the attending provider.

## 2023-11-04 NOTE — ED Provider Notes (Signed)
 [place] EMERGENCY DEPARTMENT AT [facility] Provider Note   CSN: [id number] Arrival date & time: 11/02/23  [id number]     Patient presents with: Chest Pain and Shortness of Breath   Christopher Henson is a 43 y.o. male.   Patient complains of chest pain and shortness of breath.  Patient states that the pain has improved.  He is no longer short of breath.  And has not had any pleuritic pain.  He has a history of neuroendocrine tumor in the lung.  The history is provided by the patient and medical records. No language interpreter was used.  Chest Pain Pain location:  L chest Pain quality: aching   Pain radiates to:  Does not radiate Pain severity:  Mild Onset quality:  Sudden Timing:  Intermittent Progression:  Resolved Chronicity:  Recurrent Associated symptoms: shortness of breath   Associated symptoms: no abdominal pain, no back pain, no cough, no fatigue and no headache   Shortness of Breath Associated symptoms: chest pain   Associated symptoms: no abdominal pain, no cough, no headaches and no rash        Prior to Admission medications   Medication Sig Start Date End Date Taking? Authorizing Provider  acetaminophen  (TYLENOL ) 325 MG tablet Take 2 tablets (650 mg total) by mouth every 6 (six) hours as needed. 01/17/23   Raenelle Coria, MD  bictegravir-emtricitabine -tenofovir  AF (BIKTARVY ) 50-200-25 MG TABS tablet Take 1 tablet by mouth daily. 10/04/23   Vu, Constance DASEN, MD  dexamethasone  (DECADRON ) 4 MG tablet Take 20 mg by mouth today then take 12 mg by mouth once daily thereafter 10/25/23   Tonette Lauraine HERO, PA-C  folic acid  (FOLVITE ) 1 MG tablet Take 1 tablet (1 mg total) by mouth daily. Start 7 days before pemetrexed  chemotherapy. Continue until 21 days after pemetrexed  completed. 07/11/23   Timmy Maude SAUNDERS, MD  folic acid  (FOLVITE ) 1 MG tablet Take 2 tablets (2 mg total) by mouth daily. 10/04/23   Timmy Maude SAUNDERS, MD  lidocaine -prilocaine  (EMLA ) cream Apply to affected area  once 07/11/23   Timmy Maude SAUNDERS, MD  megestrol  (MEGACE  ES) 625 MG/5ML suspension Take 5 mLs (625 mg total) by mouth daily. Patient not taking: Reported on 10/30/2023 08/22/23   Timmy Maude SAUNDERS, MD  megestrol  (MEGACE ) 400 MG/10ML suspension Take 10 mLs (400 mg total) by mouth 2 (two) times daily. Patient not taking: Reported on 10/30/2023 08/22/23   Timmy Maude SAUNDERS, MD  ondansetron  (ZOFRAN ) 8 MG tablet Take 1 tablet (8 mg total) by mouth every 8 (eight) hours as needed for nausea or vomiting. 07/11/23   Timmy Maude SAUNDERS, MD  oxyCODONE  (OXY IR/ROXICODONE ) 5 MG immediate release tablet Take 1 tablet (5 mg total) by mouth every 6 (six) hours as needed. 10/10/23   Timmy Maude SAUNDERS, MD  oxyCODONE  (OXYCONTIN ) 15 mg 12 hr tablet Take 1 tablet (15 mg total) by mouth every 12 (twelve) hours. 10/16/23   Timmy Maude SAUNDERS, MD  OXYGEN  Inhale 2 L into the lungs daily as needed.    [provider]  prednisoLONE  acetate (PRED FORTE ) 1 % ophthalmic suspension Place 1 drop into the right eye See admin instructions. 1 drop into right eye every hour while awake today, then every 2 hours while awake tomorrow and thereafter (e.g. 6-7 times per day) 10/29/23   [provider]  prednisoLONE  acetate (PRED FORTE ) 1 % ophthalmic suspension Instill 1 (one) drop into RIGHT eye every hour while awake today, then every 2 (two) hours  while awake tomorrow and thereafter (e.g. 6-7 times per day) 10/31/23     prochlorperazine  (COMPAZINE ) 10 MG tablet Take 1 tablet (10 mg total) by mouth every 6 (six) hours as needed for nausea or vomiting. 07/11/23   Timmy Maude SAUNDERS, MD  senna-docusate (SENOKOT-S) 8.6-50 MG tablet Take 1 tablet by mouth 2 (two) times daily between meals as needed for mild constipation. 01/03/23   Samtani, Jai-Gurmukh, MD    Allergies: Patient has no known allergies.    Review of Systems  Constitutional:  Negative for appetite change and fatigue.  HENT:  Negative for congestion, ear discharge and sinus pressure.    Eyes:  Negative for discharge.  Respiratory:  Positive for shortness of breath. Negative for cough.   Cardiovascular:  Positive for chest pain.  Gastrointestinal:  Negative for abdominal pain and diarrhea.  Genitourinary:  Negative for frequency and hematuria.  Musculoskeletal:  Negative for back pain.  Skin:  Negative for rash.  Neurological:  Negative for seizures and headaches.  Psychiatric/Behavioral:  Negative for hallucinations.     Updated Vital Signs BP (!) 146/92 (BP Location: Left Arm)   Pulse 65   Temp 98.1 F (36.7 C) (Oral)   Resp 16   SpO2 97%   Physical Exam Vitals and nursing note reviewed.  Constitutional:      Appearance: He is well-developed.  HENT:     Head: Normocephalic.     Nose: Nose normal.  Eyes:     General: No scleral icterus.    Conjunctiva/sclera: Conjunctivae normal.  Neck:     Thyroid : No thyromegaly.  Cardiovascular:     Rate and Rhythm: Normal rate and regular rhythm.     Heart sounds: No murmur heard.    No friction rub. No gallop.  Pulmonary:     Breath sounds: No stridor. No wheezing or rales.  Chest:     Chest wall: No tenderness.  Abdominal:     General: There is no distension.     Tenderness: There is no abdominal tenderness. There is no rebound.  Musculoskeletal:        General: Normal range of motion.     Cervical back: Neck supple.  Lymphadenopathy:     Cervical: No cervical adenopathy.  Skin:    Findings: No erythema or rash.  Neurological:     Mental Status: He is alert and oriented to person, place, and time.     Motor: No abnormal muscle tone.     Coordination: Coordination normal.  Psychiatric:        Behavior: Behavior normal.     (all labs ordered are listed, but only abnormal results are displayed) Labs Reviewed  COMPREHENSIVE METABOLIC PANEL WITH GFR - Abnormal; Notable for the following components:      Result Value   Albumin  3.3 (*)    ALT 62 (*)    All other components within normal limits  CBC  WITH DIFFERENTIAL/PLATELET - Abnormal; Notable for the following components:   WBC 2.7 (*)    RBC 3.49 (*)    Hemoglobin 11.8 (*)    HCT 35.5 (*)    MCV 101.7 (*)    RDW 21.0 (*)    Platelets 59 (*)    nRBC 4.0 (*)    Neutro Abs 1.3 (*)    All other components within normal limits  LIPASE, BLOOD  URINALYSIS, ROUTINE W REFLEX MICROSCOPIC  BRAIN NATRIURETIC PEPTIDE  TROPONIN I (HIGH SENSITIVITY)  TROPONIN I (HIGH SENSITIVITY)    EKG:  EKG Interpretation Date/Time:  Friday November 02 2023 09:39:36 EDT Ventricular Rate:  74 PR Interval:  115 QRS Duration:  92 QT Interval:  400 QTC Calculation: 444 R Axis:   76  Text Interpretation: Sinus rhythm Borderline short PR interval Nonspecific T abnormalities, lateral leads Confirmed by Suzette Pac [phone]) on 11/02/2023 1:09:18 PM  Radiology: No results found.   Procedures   Medications Ordered in the ED - No data to display                                  Medical Decision Making  Patient with neuroendocrine tumor in his lung.  Pain in his chest most likely related to the tumor.  He has improved and is not having pleuritic pain.  Patient will continue taking his pain medicine follow-up with his oncologist next week     Final diagnoses:  Atypical chest pain    ED Discharge Orders     None          Suzette Pac, MD 11/04/23 1038

## 2023-11-05 ENCOUNTER — Ambulatory Visit
Admission: RE | Admit: 2023-11-05 | Discharge: 2023-11-05 | Disposition: A | Discharge status: Home / Self Care | Source: Ambulatory Visit | Attending: Radiation Oncology | Admitting: Radiation Oncology

## 2023-11-05 ENCOUNTER — Encounter

## 2023-11-05 ENCOUNTER — Other Ambulatory Visit (HOSPITAL_BASED_OUTPATIENT_CLINIC_OR_DEPARTMENT_OTHER): Payer: Self-pay

## 2023-11-05 ENCOUNTER — Other Ambulatory Visit: Payer: Self-pay

## 2023-11-05 ENCOUNTER — Encounter: Payer: Self-pay | Admitting: Radiation Oncology

## 2023-11-05 VITALS — BP 143/95 | HR 71 | Temp 97.3°F | Resp 18 | Ht 72.0 in | Wt 163.2 lb

## 2023-11-05 DIAGNOSIS — C7A1 Malignant poorly differentiated neuroendocrine tumors: Secondary | ICD-10-CM | POA: Diagnosis not present

## 2023-11-05 DIAGNOSIS — H2000 Unspecified acute and subacute iridocyclitis: Secondary | ICD-10-CM | POA: Diagnosis not present

## 2023-11-05 DIAGNOSIS — Z9221 Personal history of antineoplastic chemotherapy: Secondary | ICD-10-CM | POA: Insufficient documentation

## 2023-11-05 DIAGNOSIS — H25811 Combined forms of age-related cataract, right eye: Secondary | ICD-10-CM | POA: Diagnosis not present

## 2023-11-05 DIAGNOSIS — J90 Pleural effusion, not elsewhere classified: Secondary | ICD-10-CM | POA: Insufficient documentation

## 2023-11-05 DIAGNOSIS — Z7952 Long term (current) use of systemic steroids: Secondary | ICD-10-CM | POA: Diagnosis not present

## 2023-11-05 DIAGNOSIS — C3412 Malignant neoplasm of upper lobe, left bronchus or lung: Secondary | ICD-10-CM | POA: Diagnosis not present

## 2023-11-05 DIAGNOSIS — H40051 Ocular hypertension, right eye: Secondary | ICD-10-CM | POA: Diagnosis not present

## 2023-11-05 DIAGNOSIS — H4051X0 Glaucoma secondary to other eye disorders, right eye, stage unspecified: Secondary | ICD-10-CM | POA: Diagnosis not present

## 2023-11-05 DIAGNOSIS — Z79624 Long term (current) use of inhibitors of nucleotide synthesis: Secondary | ICD-10-CM | POA: Insufficient documentation

## 2023-11-05 DIAGNOSIS — C7972 Secondary malignant neoplasm of left adrenal gland: Secondary | ICD-10-CM | POA: Insufficient documentation

## 2023-11-05 DIAGNOSIS — C7931 Secondary malignant neoplasm of brain: Secondary | ICD-10-CM | POA: Insufficient documentation

## 2023-11-05 DIAGNOSIS — C7951 Secondary malignant neoplasm of bone: Secondary | ICD-10-CM | POA: Insufficient documentation

## 2023-11-05 DIAGNOSIS — H2512 Age-related nuclear cataract, left eye: Secondary | ICD-10-CM | POA: Diagnosis not present

## 2023-11-05 DIAGNOSIS — H5711 Ocular pain, right eye: Secondary | ICD-10-CM | POA: Diagnosis not present

## 2023-11-05 DIAGNOSIS — Z923 Personal history of irradiation: Secondary | ICD-10-CM | POA: Diagnosis not present

## 2023-11-05 DIAGNOSIS — R0789 Other chest pain: Secondary | ICD-10-CM | POA: Diagnosis not present

## 2023-11-05 DIAGNOSIS — H44001 Unspecified purulent endophthalmitis, right eye: Secondary | ICD-10-CM | POA: Diagnosis not present

## 2023-11-05 MED ORDER — BRIMONIDINE TARTRATE 0.2 % OP SOLN
1.0000 [drp] | Freq: Two times a day (BID) | OPHTHALMIC | 1 refills | Status: DC
  Filled 2023-11-05: qty 15, 90d supply, fill #0

## 2023-11-05 MED ORDER — DORZOLAMIDE HCL-TIMOLOL MAL 2-0.5 % OP SOLN
1.0000 [drp] | Freq: Two times a day (BID) | OPHTHALMIC | 1 refills | Status: DC
  Filled 2023-11-05: qty 10, 90d supply, fill #0

## 2023-11-05 MED ORDER — MOXIFLOXACIN HCL 400 MG PO TABS
400.0000 mg | ORAL_TABLET | Freq: Every day | ORAL | 0 refills | Status: DC
  Filled 2023-11-05: qty 10, 10d supply, fill #0

## 2023-11-05 NOTE — Progress Notes (Signed)
 Christopher Henson is here today for follow up post radiation to the lung and abdomen  Lung Side: left, patient completed treatment on 09/18/23. Abdomen- 06/18/23-07/06/23.   Does the patient complain of any of the following: Pain: Yes, patient continues to have pain to left side ribs cage area.  Shortness of breath w/wo exertion: Yes mostly on exertion.  Cough: No Hemoptysis: No Nausea/Vomiting: intermittent nausea.  Pain with swallowing: No Swallowing/choking concerns: No Appetite: Fair  Energy Level: low Post radiation skin Changes: No    Additional comments if applicable: Patient reports feeling lightheadedness and light sensitivity.    BP (!) 143/95 (BP Location: Left Arm, Patient Position: Sitting, Cuff Size: Normal)   Pulse 71   Temp (!) 97.3 F (36.3 C)   Resp 18   Ht 6' (1.829 m)   Wt 163 lb 3.2 oz (74 kg)   SpO2 100%   BMI 22.13 kg/m

## 2023-11-06 ENCOUNTER — Other Ambulatory Visit (HOSPITAL_BASED_OUTPATIENT_CLINIC_OR_DEPARTMENT_OTHER): Payer: Self-pay

## 2023-11-06 ENCOUNTER — Telehealth: Payer: Self-pay | Admitting: Radiation Oncology

## 2023-11-06 DIAGNOSIS — C7A1 Malignant poorly differentiated neuroendocrine tumors: Secondary | ICD-10-CM | POA: Diagnosis not present

## 2023-11-06 DIAGNOSIS — C3412 Malignant neoplasm of upper lobe, left bronchus or lung: Secondary | ICD-10-CM | POA: Diagnosis not present

## 2023-11-06 DIAGNOSIS — H4051X0 Glaucoma secondary to other eye disorders, right eye, stage unspecified: Secondary | ICD-10-CM | POA: Diagnosis not present

## 2023-11-06 DIAGNOSIS — H5711 Ocular pain, right eye: Secondary | ICD-10-CM | POA: Diagnosis not present

## 2023-11-06 NOTE — Telephone Encounter (Addendum)
 Received referral from Dr. Tonette to schedule consultation for Radiation. Per RN [facility] consult not needed at this time. Referral will be closed at this time.

## 2023-11-07 ENCOUNTER — Other Ambulatory Visit

## 2023-11-07 ENCOUNTER — Inpatient Hospital Stay: Admitting: Dietician

## 2023-11-07 NOTE — Progress Notes (Signed)
 Nutrition follow up:  Reached out to patient at home/mobile telephone number.  Relayed concern with continued weight loss.  He reports he eats what's in his home.  He denies nausea or vomiting.  He says he has food in his home and that the can foods wouldn't help him.  He is not taking any ONS at this time, I suggested he ask for samples at cancer center.  States appetite stimulants don't work.  He refused to consider getting help with food bags from cancer center. He thanked me for calling and hung up.  Micheline Craven, RDN, LDN Registered Dietitian, [facility] Part Time Remote (Usual office hours: Tuesday-Thursday) Mobile: [phone] Remote Office: [phone]

## 2023-11-08 ENCOUNTER — Other Ambulatory Visit: Payer: Self-pay

## 2023-11-08 ENCOUNTER — Ambulatory Visit: Attending: Radiation Oncology | Admitting: Radiation Oncology

## 2023-11-08 DIAGNOSIS — C7A1 Malignant poorly differentiated neuroendocrine tumors: Secondary | ICD-10-CM | POA: Insufficient documentation

## 2023-11-11 DIAGNOSIS — I498 Other specified cardiac arrhythmias: Secondary | ICD-10-CM | POA: Diagnosis not present

## 2023-11-11 DIAGNOSIS — R001 Bradycardia, unspecified: Secondary | ICD-10-CM | POA: Diagnosis not present

## 2023-11-11 DIAGNOSIS — R079 Chest pain, unspecified: Secondary | ICD-10-CM | POA: Diagnosis not present

## 2023-11-12 ENCOUNTER — Ambulatory Visit
Admission: RE | Admit: 2023-11-12 | Discharge: 2023-11-12 | Disposition: A | Discharge status: Home / Self Care | Source: Ambulatory Visit | Attending: Radiation Oncology | Admitting: Radiation Oncology

## 2023-11-12 DIAGNOSIS — J90 Pleural effusion, not elsewhere classified: Secondary | ICD-10-CM | POA: Diagnosis present

## 2023-11-12 DIAGNOSIS — C3412 Malignant neoplasm of upper lobe, left bronchus or lung: Secondary | ICD-10-CM | POA: Diagnosis not present

## 2023-11-12 DIAGNOSIS — C7A1 Malignant poorly differentiated neuroendocrine tumors: Secondary | ICD-10-CM | POA: Insufficient documentation

## 2023-11-12 DIAGNOSIS — J9859 Other diseases of mediastinum, not elsewhere classified: Secondary | ICD-10-CM | POA: Diagnosis present

## 2023-11-12 DIAGNOSIS — Z510 Encounter for antineoplastic radiation therapy: Secondary | ICD-10-CM | POA: Diagnosis not present

## 2023-11-12 DIAGNOSIS — J910 Malignant pleural effusion: Secondary | ICD-10-CM | POA: Diagnosis present

## 2023-11-13 ENCOUNTER — Other Ambulatory Visit: Payer: Self-pay

## 2023-11-13 ENCOUNTER — Other Ambulatory Visit (HOSPITAL_BASED_OUTPATIENT_CLINIC_OR_DEPARTMENT_OTHER): Payer: Self-pay

## 2023-11-13 ENCOUNTER — Other Ambulatory Visit: Payer: Self-pay | Admitting: Hematology & Oncology

## 2023-11-13 MED ORDER — OXYCODONE HCL ER 15 MG PO T12A
15.0000 mg | EXTENDED_RELEASE_TABLET | Freq: Two times a day (BID) | ORAL | 0 refills | Status: DC
  Filled 2023-11-13 – 2023-11-14 (×2): qty 60, 30d supply, fill #0

## 2023-11-13 MED ORDER — OXYCODONE HCL 5 MG PO TABS
5.0000 mg | ORAL_TABLET | Freq: Four times a day (QID) | ORAL | 0 refills | Status: DC | PRN
  Filled 2023-11-13: qty 60, 15d supply, fill #0

## 2023-11-14 ENCOUNTER — Other Ambulatory Visit (HOSPITAL_BASED_OUTPATIENT_CLINIC_OR_DEPARTMENT_OTHER): Payer: Self-pay

## 2023-11-14 ENCOUNTER — Inpatient Hospital Stay (HOSPITAL_BASED_OUTPATIENT_CLINIC_OR_DEPARTMENT_OTHER): Admitting: Hematology & Oncology

## 2023-11-14 ENCOUNTER — Encounter: Payer: Self-pay | Admitting: Internal Medicine

## 2023-11-14 ENCOUNTER — Telehealth: Payer: Self-pay | Admitting: Hematology & Oncology

## 2023-11-14 ENCOUNTER — Other Ambulatory Visit: Payer: Self-pay

## 2023-11-14 ENCOUNTER — Inpatient Hospital Stay

## 2023-11-14 ENCOUNTER — Encounter: Payer: Self-pay | Admitting: *Deleted

## 2023-11-14 ENCOUNTER — Encounter: Payer: Self-pay | Admitting: Hematology & Oncology

## 2023-11-14 ENCOUNTER — Ambulatory Visit: Admitting: Internal Medicine

## 2023-11-14 VITALS — BP 129/90 | HR 101 | Temp 98.0°F | Wt 170.0 lb

## 2023-11-14 DIAGNOSIS — C7A1 Malignant poorly differentiated neuroendocrine tumors: Secondary | ICD-10-CM | POA: Insufficient documentation

## 2023-11-14 DIAGNOSIS — R519 Headache, unspecified: Secondary | ICD-10-CM | POA: Insufficient documentation

## 2023-11-14 DIAGNOSIS — Z452 Encounter for adjustment and management of vascular access device: Secondary | ICD-10-CM | POA: Insufficient documentation

## 2023-11-14 DIAGNOSIS — C7931 Secondary malignant neoplasm of brain: Secondary | ICD-10-CM

## 2023-11-14 DIAGNOSIS — G893 Neoplasm related pain (acute) (chronic): Secondary | ICD-10-CM | POA: Insufficient documentation

## 2023-11-14 DIAGNOSIS — H209 Unspecified iridocyclitis: Secondary | ICD-10-CM

## 2023-11-14 DIAGNOSIS — Z21 Asymptomatic human immunodeficiency virus [HIV] infection status: Secondary | ICD-10-CM | POA: Insufficient documentation

## 2023-11-14 DIAGNOSIS — B20 Human immunodeficiency virus [HIV] disease: Secondary | ICD-10-CM | POA: Diagnosis not present

## 2023-11-14 LAB — CBC WITH DIFFERENTIAL (CANCER CENTER ONLY)
Abs Immature Granulocytes: 0.04 K/uL (ref 0.00–0.07)
Basophils Absolute: 0 K/uL (ref 0.0–0.1)
Basophils Relative: 0 %
Eosinophils Absolute: 0 K/uL (ref 0.0–0.5)
Eosinophils Relative: 0 %
HCT: 35.1 % — ABNORMAL LOW (ref 39.0–52.0)
Hemoglobin: 11.5 g/dL — ABNORMAL LOW (ref 13.0–17.0)
Immature Granulocytes: 1 %
Lymphocytes Relative: 16 %
Lymphs Abs: 1.1 K/uL (ref 0.7–4.0)
MCH: 33.7 pg (ref 26.0–34.0)
MCHC: 32.8 g/dL (ref 30.0–36.0)
MCV: 102.9 fL — ABNORMAL HIGH (ref 80.0–100.0)
Monocytes Absolute: 1.2 K/uL — ABNORMAL HIGH (ref 0.1–1.0)
Monocytes Relative: 18 %
Neutro Abs: 4.3 K/uL (ref 1.7–7.7)
Neutrophils Relative %: 65 %
Platelet Count: 134 K/uL — ABNORMAL LOW (ref 150–400)
RBC: 3.41 MIL/uL — ABNORMAL LOW (ref 4.22–5.81)
RDW: 19.3 % — ABNORMAL HIGH (ref 11.5–15.5)
WBC Count: 6.7 K/uL (ref 4.0–10.5)
nRBC: 0 % (ref 0.0–0.2)

## 2023-11-14 LAB — CMP (CANCER CENTER ONLY)
ALT: 108 U/L — ABNORMAL HIGH (ref 0–44)
AST: 57 U/L — ABNORMAL HIGH (ref 15–41)
Albumin: 3.8 g/dL (ref 3.5–5.0)
Alkaline Phosphatase: 122 U/L (ref 38–126)
Anion gap: 11 (ref 5–15)
BUN: 17 mg/dL (ref 6–20)
CO2: 23 mmol/L (ref 22–32)
Calcium: 8.9 mg/dL (ref 8.9–10.3)
Chloride: 107 mmol/L (ref 98–111)
Creatinine: 0.81 mg/dL (ref 0.61–1.24)
GFR, Estimated: >60 mL/min (ref >60)
Glucose, Bld: 89 mg/dL (ref 70–99)
Potassium: 4.1 mmol/L (ref 3.5–5.1)
Sodium: 141 mmol/L (ref 135–145)
Total Bilirubin: 0.3 mg/dL (ref 0.0–1.2)
Total Protein: 6.5 g/dL (ref 6.5–8.1)

## 2023-11-14 MED ORDER — MEGESTROL ACETATE 625 MG/5ML PO SUSP
625.0000 mg | Freq: Every day | ORAL | 4 refills | Status: DC
  Filled 2023-11-14: qty 150, 30d supply, fill #0
  Filled 2023-12-17: qty 150, 30d supply, fill #1

## 2023-11-14 MED ORDER — BICTEGRAVIR-EMTRICITAB-TENOFOV 50-200-25 MG PO TABS
1.0000 | ORAL_TABLET | Freq: Every day | ORAL | 11 refills | Status: DC
Start: 2023-11-14 — End: 2023-12-25

## 2023-11-14 NOTE — Progress Notes (Unsigned)
 Patient her for consideration of treatment. Since his last visit, patient was confimed to have new brain mets He has already been seen and worked up by RadOnc. He will start radiation on 11/20/2023.  Treatment was held. He will return next week for reconsideration. He will also need a CT for restaging in the next few weeks.   Oncology Nurse Navigator Documentation     11/14/2023    9:00 AM  Oncology Nurse Navigator Flowsheets  Phase of Treatment Radiation  Radiation Actual Start Date: 11/20/2023  Radiation Expected End Date: 12/11/2023  Navigator Follow Up Date: 11/22/2023  Navigator Follow Up Reason: Follow-up Appointment;Chemotherapy  Navigator Location [facility]  Navigator Encounter Type Follow-up Appt  Patient Visit Type MedOnc  Treatment Phase Active Tx  Barriers/Navigation Needs Coordination of Care;Education  Interventions None Required  Acuity Level 1-No Barriers  Time Spent with Patient 15

## 2023-11-14 NOTE — Patient Instructions (Signed)

## 2023-11-14 NOTE — Telephone Encounter (Signed)
 sent mychart scheduling message advising of appt on 11/22/23.

## 2023-11-14 NOTE — Progress Notes (Signed)
 Hematology and Oncology Follow Up Visit  Christopher Henson [id number] [birth year] 43 y.o. 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  Unfortunately, it looks like he is dealing with CNS metastasis now.  He was complaining of headaches the last time that he was here.  He was having some visual changes.  We ultimately got a MRI of the brain.  This was done on 10/24/2023.  This did show small foci.  Largest measured 1 cm.  The lesions do not cause any type of edema or mass effect.  A follow-up MRI was done on 10/27/2023.  Again this showed 4 foci of abnormality within the supratentorial and infratentorial brain.  Again, lesions measure up to 10 mm.  He has seen Radiation Oncology.  He has not yet started radiosurgery.  I think this will start this upcoming Monday.  As far as chemotherapy, he is tolerated this pretty well.  He still has some pain issues.  However he says this is fairly well-controlled.  He has had decent appetite.  His weight is holding steady.  He has had no cough.  He has had no nausea or vomiting.  There is been no obvious change in bowel or bladder habits.  He has had no diarrhea.  There has been no leg swelling.  Currently, I would have said that his performance status is probably ECOG 1.    Wt Readings from Last 3 Encounters:  11/14/23 167 lb (75.8 kg)  11/05/23 163 lb 3.2 oz (74 kg)  11/01/23 168 lb (76.2 kg)   Medications:  Current Outpatient Medications:     acetaminophen  (TYLENOL ) 325 MG tablet, Take 2 tablets (650 mg total) by mouth every 6 (six) hours as needed., Disp: , Rfl:    bictegravir-emtricitabine -tenofovir  AF (BIKTARVY ) 50-200-25 MG TABS tablet, Take 1 tablet by mouth daily., Disp: 30 tablet, Rfl: 11   brimonidine  (ALPHAGAN ) 0.2 % ophthalmic solution, Place 1 drop into the right eye 2 (two) times daily., Disp: 15 mL, Rfl: 1   dexamethasone  (DECADRON ) 4 MG tablet, Take 20 mg by mouth today then take 12 mg by mouth once daily thereafter, Disp: 92 tablet, Rfl: 1   dorzolamide -timolol  (COSOPT ) 2-0.5 % ophthalmic solution, Place 1 drop into the right eye 2 (two) times daily., Disp: 10 mL, Rfl: 1   folic acid  (FOLVITE ) 1 MG tablet, Take 1 tablet (1 mg total) by mouth daily. Start 7 days before pemetrexed  chemotherapy. Continue until 21 days after pemetrexed  completed., Disp: 100 tablet, Rfl: 3   folic acid  (FOLVITE ) 1 MG tablet, Take 2 tablets (2 mg total) by mouth daily., Disp: 60 tablet, Rfl: 3   lidocaine -prilocaine  (EMLA ) cream, Apply to affected area once, Disp: 30 g, Rfl: 3   moxifloxacin  (AVELOX ) 400 MG tablet, Take 1 tablet by mouth once a day, Disp: 10 tablet, Rfl: 0   ondansetron  (ZOFRAN ) 8 MG tablet, Take 1 tablet (8 mg total) by mouth every 8 (eight) hours as needed for nausea or vomiting., Disp: 30 tablet, Rfl: 1  oxyCODONE  (OXY IR/ROXICODONE ) 5 MG immediate release tablet, Take 1 tablet (5 mg total) by mouth every 6 (six) hours as needed., Disp: 60 tablet, Rfl: 0   oxyCODONE  (OXYCONTIN ) 15 mg 12 hr tablet, Take 1 tablet (15 mg total) by mouth every 12 (twelve) hours., Disp: 60 tablet, Rfl: 0   OXYGEN , Inhale 2 L into the lungs daily as needed., Disp: , Rfl:    prochlorperazine  (COMPAZINE ) 10 MG tablet, Take 1 tablet (10 mg total) by mouth every 6 (six) hours as needed for nausea or vomiting., Disp: 30 tablet, Rfl: 1   megestrol  (MEGACE  ES) 625 MG/5ML suspension, Take 5 mLs (625 mg total) by mouth daily., Disp: 150 mL, Rfl: 4    prednisoLONE  acetate (PRED FORTE ) 1 % ophthalmic suspension, Place 1 drop into the right eye See admin instructions. 1 drop into right eye every hour while awake today, then every 2 hours while awake tomorrow and thereafter (e.g. 6-7 times per day) (Patient not taking: Reported on 11/14/2023), Disp: , Rfl:    prednisoLONE  acetate (PRED FORTE ) 1 % ophthalmic suspension, Instill 1 (one) drop into RIGHT eye every hour while awake today, then every 2 (two) hours while awake tomorrow and thereafter (e.g. 6-7 times per day) (Patient not taking: Reported on 11/14/2023), Disp: 5 mL, Rfl: 0   senna-docusate (SENOKOT-S) 8.6-50 MG tablet, Take 1 tablet by mouth 2 (two) times daily between meals as needed for mild constipation. (Patient not taking: Reported on 11/14/2023), Disp: , Rfl:   Allergies: No Known Allergies  Past Medical History, Surgical history, Social history, and Family History were reviewed and updated.  Review of Systems: Review of Systems  Constitutional: Negative.   HENT:  Negative.    Eyes:  Positive for eye problems.  Respiratory:  Positive for cough and shortness of breath.   Cardiovascular:  Negative for chest pain.  Gastrointestinal: Negative.   Endocrine: Negative.   Genitourinary: Negative.    Musculoskeletal: Negative.   Skin: Negative.   Neurological:  Positive for headaches.  Hematological: Negative.   Psychiatric/Behavioral: Negative.      Physical Exam:  Vital signs show temperature 98.3.  Pulse 89.  Blood pressure 124/78.  Weight is 167 pounds.   Wt Readings from Last 3 Encounters:  11/14/23 167 lb (75.8 kg)  11/05/23 163 lb 3.2 oz (74 kg)  11/01/23 168 lb (76.2 kg)    Physical Exam Vitals reviewed.  HENT:     Head: Normocephalic and atraumatic.  Eyes:     Pupils: Pupils are equal, round, and reactive to light.  Cardiovascular:     Rate and Rhythm: Normal rate and regular rhythm.     Heart sounds: Normal heart sounds.  Pulmonary:     Effort: Pulmonary effort  is normal.     Breath sounds: Normal breath sounds.     Comments: His lungs sound relatively clear bilaterally.  Maybe a little bit decreased and breath sounds are on the left lower lung. Abdominal:     General: Bowel sounds are normal.     Palpations: Abdomen is soft.     Comments: Abdominal exam is soft.  He has good bowel sounds.  There is no fluid wave but his abdomen feels mild to moderately distended. There is no palpable liver or spleen tip.  Musculoskeletal:        General: No tenderness or deformity. Normal range of motion.     Cervical back: Normal range of motion.  Lymphadenopathy:     Cervical: No cervical  adenopathy.  Skin:    General: Skin is warm and dry.     Findings: No erythema or rash.  Neurological:     General: No focal deficit present.     Mental Status: He is alert and oriented to person, place, and time.     Cranial Nerves: No cranial nerve deficit.     Coordination: Coordination normal.     Gait: Gait normal.  Psychiatric:        Behavior: Behavior normal.        Thought Content: Thought content normal.        Judgment: Judgment normal.      Lab Results  Component Value Date   WBC 6.7 11/14/2023   HGB 11.5 (L) 11/14/2023   HCT 35.1 (L) 11/14/2023   MCV 102.9 (H) 11/14/2023   PLT 134 (L) 11/14/2023     Chemistry      Component Value Date/Time   NA 141 11/14/2023 0917   NA 143 02/25/2019 1041   K 4.1 11/14/2023 0917   CL 107 11/14/2023 0917   CO2 23 11/14/2023 0917   BUN 17 11/14/2023 0917   BUN 8 02/25/2019 1041   CREATININE 0.81 11/14/2023 0917   CREATININE 0.89 12/14/2022 1132      Component Value Date/Time   CALCIUM  8.9 11/14/2023 0917   ALKPHOS 122 11/14/2023 0917   AST 57 (H) 11/14/2023 0917   ALT 108 (H) 11/14/2023 0917   BILITOT 0.3 11/14/2023 0917      Impression and Plan: Mr. Vercher is a very nice 43 year old African-American male. He has what certainly is acting like a large cell neuroendocrine carcinoma.   He has received  chemo radiation therapy for the large thoracic mass.  He currently is on maintenance therapy with Alimta  and Avastin .  He has unfortunately, brain metastasis.  He really is not bothered by this which is nice to see.  Thankfully, Radiation Oncology will start treatment for these metastasis next week.  We really need to get another scan on him to see how everything looks.  I will see back in 1 set up in 2-3 weeks.  I just do not think he needs any treatment right now.  We will have to hold him off and see by have him come back in a week and then maybe give him a treatment.   Maude JONELLE Crease, MD 8/6/20251:46 PM

## 2023-11-14 NOTE — Progress Notes (Signed)
No treatment today per Dr Ennever 

## 2023-11-14 NOTE — Progress Notes (Signed)
 Subjective:    Patient ID: Christopher Henson, male    DOB: Oct 17, 1980, 43 y.o.   MRN: [id number]  HPI Christopher Henson is here for follow up of HIV He has a history of a 184v mutation and has remained on Biktarvy  and Prezcobix  with no issues.   10/04/23 id clinic f/u First visit with me Reviewed genotype -- -12/2020 cabotegravir/bictegravir/elvitegravir resistance not predicted -12/2020 genotype nrti/nnrti/pi: m184v and r211k He missed a week ART as he couldn't make his appointments recently. 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); chemo platinum/taxol /bevacizumab /tecentriq  s/p 6 cycle since 01/2023;  alimta /avastin  s/p 4 cycle since 07/11/23; xrt completeded 09/18/23; zometa ; ecog 1 -reviewed labs   Other id hx: Hx crypto meningitis Lab Results  Component Value Date   CD4TCELL 32 (L) 10/04/2023   CD4TABS 190 (L) 10/04/2023  He was on fluconazole  at least since 2020 and last stopped 01/2023 12/19/22 crypto ag positive titer 2560 12/22/22 crypto ag negative   Soc: -smoker -- stopped 12/2022 -marijuanna some times; no other illiciats -lives with relatives now -not currently working -not sexually active since at least last visit 05/2022 with dr Efrain   Complaint: Christopher Henson for right eye and photosensitivity one month -- stable; as of 10/04/23 visit still blurry vision No headache, nausea No fever chill   Hx poor dentition/periodontitis Nothing painful and no gum disease complaint today      11/14/23 id clinic f/u Patient saw dr Milford eye office 10/29/23 and there was evidence of acute uveitis right eye. Eye drop prednisone  given and referred to retinal specialist dr Maree -- pending next week visit Continue to get chemo for  lung cancer His cd4 is 190 (30%) on 09/2023 and I am not worried about active cryptococcal meningitis Taking biktarvy  (ok) alone no missed dose last 4 weeks  Right eye stable vision from last visit 10/04/23  From 10/29/23 dr Milford visit   Ros: Breathing issue stable with lung cancer/lung radiation No fever, chill, rash, headache, joint pain         Lab Results  Component Value Date   WBC 6.7 11/14/2023   HGB 11.5 (L) 11/14/2023   HCT 35.1 (L) 11/14/2023   MCV 102.9 (H) 11/14/2023   PLT 134 (L) 11/14/2023   Last metabolic panel Lab Results  Component Value Date   GLUCOSE 89 11/14/2023   NA 141 11/14/2023   K 4.1 11/14/2023   CL 107 11/14/2023   CO2 23 11/14/2023   BUN 17 11/14/2023   CREATININE 0.81 11/14/2023   GFRNONAA >60 11/14/2023   CALCIUM  8.9 11/14/2023   PHOS 3.1 01/03/2023   PROT 6.5 11/14/2023   ALBUMIN  3.8 11/14/2023   LABGLOB 2.6 02/25/2019   AGRATIO 1.7 02/25/2019   BILITOT 0.3 11/14/2023   ALKPHOS 122 11/14/2023   AST 57 (H) 11/14/2023   ALT 108 (H) 11/14/2023   ANIONGAP 11 11/14/2023         Objective:    Vitals:   11/14/23 1515  BP: (!) 129/90  Pulse: (!) 101  Temp: 98 F (36.7 C)  SpO2: 97%    General/constitutional: no distress, pleasant HEENT: right eye hypopion Neck supple CV: rrr no mrg Lungs: clear to auscultation, normal respiratory effort Abd: Soft, Nontender Ext: no  edema Skin: No Rash Neuro: nonfocal MSK: no peripheral joint swelling/tenderness/warmth; back spines nontender    Labs: Lab Results  Component Value Date   WBC 6.7 11/14/2023   HGB 11.5 (L) 11/14/2023   HCT 35.1 (L) 11/14/2023   MCV 102.9 (H) 11/14/2023   PLT 134 (L) 11/14/2023   Last metabolic panel Lab Results  Component Value Date   GLUCOSE 89 11/14/2023   NA 141 11/14/2023   K 4.1 11/14/2023   CL 107 11/14/2023   CO2 23 11/14/2023   BUN 17 11/14/2023   CREATININE 0.81 11/14/2023   GFRNONAA >60 11/14/2023   CALCIUM  8.9 11/14/2023    PHOS 3.1 01/03/2023   PROT 6.5 11/14/2023   ALBUMIN  3.8 11/14/2023   LABGLOB 2.6 02/25/2019   AGRATIO 1.7 02/25/2019   BILITOT 0.3 11/14/2023   ALKPHOS 122 11/14/2023   AST 57 (H) 11/14/2023   ALT 108 (H) 11/14/2023   ANIONGAP 11 11/14/2023   Hiv: Lab Results  Component Value Date   HIV1RNAQUANT 20 (H) 10/04/2023   Lab Results  Component Value Date   CD4TCELL 32 (L) 10/04/2023   CD4TABS 190 (L) 10/04/2023        Assessment & Plan:   #hiv #reprieve trial/cad risk 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; phenotpyically only lamivudine resistance 10/04/23 id visit off biktarvy  for a week as couldn't make appointment Compliant and well controlled previously since 01/2023 only on biktarvy   Reviewed genotype and ok to continue biktarvy  with isolated m184v mutation (also r211k but ok)  11/14/23 June 2025 hiv labs showed well controlled  -renew biktarvy  -will need to talk about statin use soon -discussed u=u -encourage compliance -continue current HIV medication -f/u 3 months and recheck labs; mainly to follow eye issue   #uveitis new as of 10/2023 #hx crypto meningitis I don't think this is crypto relapse but I do worry that with chemo his cd4 count can drop and relapse might happen Last took fluc 12/2022  09/2023 cd4 190 (30%); no suspicion for crypto. Cr Ag 256 but again cd4 level good and been on biktarvy  with well controlled hiv ?related to small cell lung cancer  10/29/23 dr Milford eye visit --> acute uveitis with layering hypopion (not present during visit with me 10/04/23, but present today 11/14/23 on exam)  -continue eye drop -closely f/u with dr Maree (retina/uveitis specialist from [facility])   #metastatic lung cancer  -On chemo and f/u oncology; f/u with them   #poor dentition At risk for ie  -rcid dental paperwork/referral placed    #hcm Will reviewed future visits -vaccine -hepatitis Need repeat hepatitis b labs next visit -std 11/14/23 visit not  sexually active since last hiv visit 05/2022 and deferred std screen -cancer screening [facility]- will need to do anal cancer screening soon

## 2023-11-14 NOTE — Patient Instructions (Addendum)
 Continue biktarvy  alone for hiv   Make sure you follow up with dr Maree eye doctor. Please ask him to send me or print you your chart and bring to me   See me in 3 months  Mental Health Resources  988: can call or text 24/7  [facility]: Address: [address] [place], [place] [address] Open 24 hours Phone: [phone]  [facility]: Address: [address], [place], [place] [address] Phone: [phone] Appointments: [email or web address]

## 2023-11-15 ENCOUNTER — Other Ambulatory Visit: Payer: Self-pay

## 2023-11-15 ENCOUNTER — Encounter: Payer: Self-pay | Admitting: Hematology & Oncology

## 2023-11-15 ENCOUNTER — Telehealth: Payer: Self-pay | Admitting: *Deleted

## 2023-11-15 ENCOUNTER — Other Ambulatory Visit (HOSPITAL_BASED_OUTPATIENT_CLINIC_OR_DEPARTMENT_OTHER): Payer: Self-pay

## 2023-11-15 NOTE — Telephone Encounter (Addendum)
 Tried three times since 11/14/2023 to send prior authorization for Megace  ES to [employer]. Message received Unable to locate this name and date of birth. Connected with [name], [phone] (home) for information regarding insurance coverage updates.   No changes or updates except date of birth was entered as 1981/03/13 but I was born on [birth year].  Changed date of birth to the 28th allowed to answer question set for prior authorization.      Medication Prior Authorization Status  Processed CoverMyMeds KEY: [id number] for Megace  ES  Approved Today  Per [employer]  PA Case ID: [id number]   Effective 11/15/2023 through 11/14/2024

## 2023-11-16 ENCOUNTER — Encounter: Payer: Self-pay | Admitting: Hematology & Oncology

## 2023-11-19 ENCOUNTER — Ambulatory Visit

## 2023-11-19 DIAGNOSIS — C3412 Malignant neoplasm of upper lobe, left bronchus or lung: Secondary | ICD-10-CM | POA: Diagnosis not present

## 2023-11-19 DIAGNOSIS — C7A1 Malignant poorly differentiated neuroendocrine tumors: Secondary | ICD-10-CM | POA: Diagnosis not present

## 2023-11-19 DIAGNOSIS — Z510 Encounter for antineoplastic radiation therapy: Secondary | ICD-10-CM | POA: Diagnosis not present

## 2023-11-20 ENCOUNTER — Other Ambulatory Visit: Payer: Self-pay | Admitting: Radiation Oncology

## 2023-11-20 ENCOUNTER — Ambulatory Visit: Admission: RE | Admit: 2023-11-20 | Source: Ambulatory Visit | Admitting: Radiation Oncology

## 2023-11-20 ENCOUNTER — Ambulatory Visit

## 2023-11-20 MED ORDER — LORAZEPAM 1 MG PO TABS: 1.0000 mg | ORAL_TABLET | Freq: Three times a day (TID) | ORAL | 0 refills | Status: DC

## 2023-11-21 ENCOUNTER — Ambulatory Visit: Admitting: Radiation Oncology

## 2023-11-21 ENCOUNTER — Ambulatory Visit
Admission: RE | Admit: 2023-11-21 | Discharge: 2023-11-21 | Disposition: A | Discharge status: Home / Self Care | Source: Ambulatory Visit | Attending: Radiation Oncology | Admitting: Radiation Oncology

## 2023-11-21 ENCOUNTER — Encounter: Payer: Self-pay | Admitting: Hematology & Oncology

## 2023-11-21 ENCOUNTER — Other Ambulatory Visit: Payer: Self-pay

## 2023-11-21 DIAGNOSIS — Z510 Encounter for antineoplastic radiation therapy: Secondary | ICD-10-CM | POA: Diagnosis not present

## 2023-11-21 DIAGNOSIS — C7A1 Malignant poorly differentiated neuroendocrine tumors: Secondary | ICD-10-CM | POA: Diagnosis not present

## 2023-11-21 DIAGNOSIS — C3412 Malignant neoplasm of upper lobe, left bronchus or lung: Secondary | ICD-10-CM | POA: Diagnosis not present

## 2023-11-21 LAB — RAD ONC ARIA SESSION SUMMARY
Course Elapsed Days: 0
Plan Fractions Treated to Date: 1
Plan Prescribed Dose Per Fraction: 2.5 Gy
Plan Total Fractions Prescribed: 14
Plan Total Prescribed Dose: 35 Gy
Reference Point Dosage Given to Date: 2.5 Gy
Reference Point Session Dosage Given: 2.5 Gy
Session Number: 1

## 2023-11-22 ENCOUNTER — Other Ambulatory Visit: Payer: Self-pay | Admitting: Medical Oncology

## 2023-11-22 ENCOUNTER — Ambulatory Visit
Admission: RE | Admit: 2023-11-22 | Discharge: 2023-11-22 | Disposition: A | Discharge status: Home / Self Care | Source: Ambulatory Visit | Attending: Radiation Oncology | Admitting: Radiation Oncology

## 2023-11-22 ENCOUNTER — Encounter: Payer: Self-pay | Admitting: Hematology & Oncology

## 2023-11-22 ENCOUNTER — Inpatient Hospital Stay: Admitting: Medical Oncology

## 2023-11-22 ENCOUNTER — Other Ambulatory Visit

## 2023-11-22 ENCOUNTER — Inpatient Hospital Stay

## 2023-11-22 ENCOUNTER — Ambulatory Visit

## 2023-11-22 ENCOUNTER — Other Ambulatory Visit: Payer: Self-pay

## 2023-11-22 ENCOUNTER — Encounter: Payer: Self-pay | Admitting: *Deleted

## 2023-11-22 DIAGNOSIS — C3492 Malignant neoplasm of unspecified part of left bronchus or lung: Secondary | ICD-10-CM

## 2023-11-22 DIAGNOSIS — C7A1 Malignant poorly differentiated neuroendocrine tumors: Secondary | ICD-10-CM

## 2023-11-22 DIAGNOSIS — C7931 Secondary malignant neoplasm of brain: Secondary | ICD-10-CM

## 2023-11-22 DIAGNOSIS — Z510 Encounter for antineoplastic radiation therapy: Secondary | ICD-10-CM | POA: Diagnosis not present

## 2023-11-22 DIAGNOSIS — C3412 Malignant neoplasm of upper lobe, left bronchus or lung: Secondary | ICD-10-CM | POA: Diagnosis not present

## 2023-11-22 LAB — RAD ONC ARIA SESSION SUMMARY
Course Elapsed Days: 1
Plan Fractions Treated to Date: 2
Plan Prescribed Dose Per Fraction: 2.5 Gy
Plan Total Fractions Prescribed: 14
Plan Total Prescribed Dose: 35 Gy
Reference Point Dosage Given to Date: 5 Gy
Reference Point Session Dosage Given: 2.5 Gy
Session Number: 2

## 2023-11-22 NOTE — Progress Notes (Signed)
 Patient was schedule to be seen today, but came greater than 30 minutes late for his appointment. He will be rescheduled for Monday.   Oncology Nurse Navigator Documentation     11/22/2023    9:00 AM  Oncology Nurse Navigator Flowsheets  Navigator Follow Up Date: 11/26/2023  Navigator Follow Up Reason: Follow-up Appointment;Chemotherapy  Navigator Location [facility]  Navigator Encounter Type Appt/Treatment Plan Review  Patient Visit Type MedOnc  Treatment Phase Active Tx  Barriers/Navigation Needs Coordination of Care;Education  Interventions None Required  Acuity Level 1-No Barriers  Time Spent with Patient 15

## 2023-11-23 ENCOUNTER — Ambulatory Visit
Admission: RE | Admit: 2023-11-23 | Discharge: 2023-11-23 | Disposition: A | Discharge status: Home / Self Care | Source: Ambulatory Visit | Attending: Radiation Oncology | Admitting: Radiation Oncology

## 2023-11-23 ENCOUNTER — Other Ambulatory Visit: Payer: Self-pay

## 2023-11-23 DIAGNOSIS — Z510 Encounter for antineoplastic radiation therapy: Secondary | ICD-10-CM | POA: Diagnosis not present

## 2023-11-23 DIAGNOSIS — C7A1 Malignant poorly differentiated neuroendocrine tumors: Secondary | ICD-10-CM | POA: Diagnosis not present

## 2023-11-23 DIAGNOSIS — C3412 Malignant neoplasm of upper lobe, left bronchus or lung: Secondary | ICD-10-CM | POA: Diagnosis not present

## 2023-11-23 LAB — RAD ONC ARIA SESSION SUMMARY
Course Elapsed Days: 2
Plan Fractions Treated to Date: 3
Plan Prescribed Dose Per Fraction: 2.5 Gy
Plan Total Fractions Prescribed: 14
Plan Total Prescribed Dose: 35 Gy
Reference Point Dosage Given to Date: 7.5 Gy
Reference Point Session Dosage Given: 2.5 Gy
Session Number: 3

## 2023-11-24 ENCOUNTER — Other Ambulatory Visit: Payer: Self-pay

## 2023-11-24 ENCOUNTER — Emergency Department (HOSPITAL_COMMUNITY)
Admission: EM | Admit: 2023-11-24 | Discharge: 2023-11-24 | Disposition: A | Discharge status: Home / Self Care | Attending: Emergency Medicine | Admitting: Emergency Medicine

## 2023-11-24 ENCOUNTER — Emergency Department (HOSPITAL_COMMUNITY)

## 2023-11-24 ENCOUNTER — Encounter (HOSPITAL_COMMUNITY): Payer: Self-pay | Admitting: *Deleted

## 2023-11-24 DIAGNOSIS — R0789 Other chest pain: Secondary | ICD-10-CM | POA: Diagnosis not present

## 2023-11-24 DIAGNOSIS — J90 Pleural effusion, not elsewhere classified: Secondary | ICD-10-CM | POA: Diagnosis not present

## 2023-11-24 DIAGNOSIS — C3432 Malignant neoplasm of lower lobe, left bronchus or lung: Secondary | ICD-10-CM | POA: Diagnosis not present

## 2023-11-24 DIAGNOSIS — C3490 Malignant neoplasm of unspecified part of unspecified bronchus or lung: Secondary | ICD-10-CM | POA: Diagnosis not present

## 2023-11-24 LAB — CBC WITH DIFFERENTIAL/PLATELET
Abs Immature Granulocytes: 0.06 K/uL (ref 0.00–0.07)
Basophils Absolute: 0 K/uL (ref 0.0–0.1)
Basophils Relative: 0 %
Eosinophils Absolute: 0.1 K/uL (ref 0.0–0.5)
Eosinophils Relative: 1 %
HCT: 37.2 % — ABNORMAL LOW (ref 39.0–52.0)
Hemoglobin: 12.4 g/dL — ABNORMAL LOW (ref 13.0–17.0)
Immature Granulocytes: 1 %
Lymphocytes Relative: 7 %
Lymphs Abs: 0.7 K/uL (ref 0.7–4.0)
MCH: 34.4 pg — ABNORMAL HIGH (ref 26.0–34.0)
MCHC: 33.3 g/dL (ref 30.0–36.0)
MCV: 103.3 fL — ABNORMAL HIGH (ref 80.0–100.0)
Monocytes Absolute: 0.6 K/uL (ref 0.1–1.0)
Monocytes Relative: 7 %
Neutro Abs: 7.5 K/uL (ref 1.7–7.7)
Neutrophils Relative %: 84 %
Platelets: 110 K/uL — ABNORMAL LOW (ref 150–400)
RBC: 3.6 MIL/uL — ABNORMAL LOW (ref 4.22–5.81)
RDW: 18.8 % — ABNORMAL HIGH (ref 11.5–15.5)
WBC: 8.9 K/uL (ref 4.0–10.5)
nRBC: 0 % (ref 0.0–0.2)

## 2023-11-24 LAB — COMPREHENSIVE METABOLIC PANEL WITH GFR
ALT: 33 U/L (ref 0–44)
AST: 32 U/L (ref 15–41)
Albumin: 3.3 g/dL — ABNORMAL LOW (ref 3.5–5.0)
Alkaline Phosphatase: 92 U/L (ref 38–126)
Anion gap: 10 (ref 5–15)
BUN: 13 mg/dL (ref 6–20)
CO2: 22 mmol/L (ref 22–32)
Calcium: 8.9 mg/dL (ref 8.9–10.3)
Chloride: 104 mmol/L (ref 98–111)
Creatinine, Ser: 0.82 mg/dL (ref 0.61–1.24)
GFR, Estimated: >60 mL/min (ref >60)
Glucose, Bld: 112 mg/dL — ABNORMAL HIGH (ref 70–99)
Potassium: 3.9 mmol/L (ref 3.5–5.1)
Sodium: 136 mmol/L (ref 135–145)
Total Bilirubin: 0.9 mg/dL (ref 0.0–1.2)
Total Protein: 6.6 g/dL (ref 6.5–8.1)

## 2023-11-24 LAB — TROPONIN I (HIGH SENSITIVITY)
Troponin I (High Sensitivity): 6 ng/L (ref <18)
Troponin I (High Sensitivity): 5 ng/L (ref <18)

## 2023-11-24 MED ORDER — MORPHINE SULFATE (PF) 4 MG/ML IV SOLN
4.0000 mg | Freq: Once | INTRAVENOUS | Status: AC
  Administered 2023-11-24: 4 mg via INTRAVENOUS
  Filled 2023-11-24 (×2): qty 1

## 2023-11-24 MED ORDER — IOHEXOL 350 MG/ML SOLN
75.0000 mL | Freq: Once | INTRAVENOUS | Status: AC | PRN
  Administered 2023-11-24: 75 mL via INTRAVENOUS

## 2023-11-24 MED ORDER — ONDANSETRON HCL 4 MG/2ML IJ SOLN
4.0000 mg | Freq: Once | INTRAMUSCULAR | Status: AC
  Administered 2023-11-24: 4 mg via INTRAVENOUS
  Filled 2023-11-24 (×2): qty 2

## 2023-11-24 MED ORDER — OXYCODONE-ACETAMINOPHEN 5-325 MG PO TABS
1.0000 | ORAL_TABLET | Freq: Once | ORAL | Status: AC
  Administered 2023-11-24: 1 via ORAL
  Filled 2023-11-24: qty 1

## 2023-11-24 MED ORDER — OXYCODONE HCL 5 MG PO TABS: 5.0000 mg | ORAL_TABLET | ORAL | 0 refills | Status: DC | PRN

## 2023-11-24 NOTE — ED Provider Triage Note (Signed)
 Emergency Medicine Provider Triage Evaluation Note  Christopher Henson , a 43 y.o. male  was evaluated in triage.  Pt complains of chest pain.  Patient reports left-sided chest pain for the last several days.  States that he is currently being treated for lung cancer with metastatic spread to his brain.  Denies any feelings of shortness of breath.  Pain typically worsens with movement.  No recent coughing, fever, chills or bodyaches.  Review of Systems  Positive: As above Negative: As above  Physical Exam  BP (!) 120/93 (BP Location: Right Arm)   Pulse 92   Temp 98.2 F (36.8 C)   Resp 16   SpO2 100%  Gen:   Awake, no distress   Resp:  Normal effort, no wheezing, rales, rhonch MSK:   Moves extremities without difficulty, no appreciable chest wall tenderness Other:    Medical Decision Making  Medically screening exam initiated at 2:31 PM.  Appropriate orders placed.  Christopher Henson was informed that the remainder of the evaluation will be completed by another provider, this initial triage assessment does not replace that evaluation, and the importance of remaining in the ED until their evaluation is complete.     Sritha Chauncey A, PA-C 11/24/23 1432

## 2023-11-24 NOTE — ED Notes (Signed)
 Upon meeting the pt he asked what time they were going to get him for CT, this RN informed the pt that CT operates on a schedule that we do not see. The pt became agitated and started stating just discharge me and take this thing out of my arm. This RN informed the pt the provider does discharges. MD notified.

## 2023-11-24 NOTE — ED Notes (Signed)
 EDP at bedside

## 2023-11-24 NOTE — ED Notes (Signed)
 Pt back from CT

## 2023-11-24 NOTE — ED Triage Notes (Signed)
 C/o chest pain nausea dizziness onset this am

## 2023-11-24 NOTE — ED Notes (Signed)
 Pt cursed out the lab tech and refused his blood draw rn marlow aware at

## 2023-11-24 NOTE — Discharge Instructions (Addendum)
 You were seen today for left-sided chest pain. While you were here we monitored your vitals, preformed a physical exam, and labs and imaging.  It does look like your lung cancer is causing increased fluid in your left lung.  It also appears you have multiple metastases.  I have made some changes to your pain regiment to help control your pain.  Things to do:  - Follow up with your primary care provider within the next 1-2 weeks - Please begin taking your oxycodone  every 4 hours for breakthrough pain and continue taking your OxyContin  as prescribed  Return to the emergency department if you have any new or worsening symptoms including worsening in your breathing, worsening chest pain, or if you have any other concerns.

## 2023-11-24 NOTE — ED Provider Notes (Signed)
 [place] [facility] AT [facility] Provider Note  MDM   HPI/ROS:  Christopher Henson is a 43 y.o. male with a medical history as below who presents with nonexertional left-sided chest pain that is nonradiating, constant.  States that he has this pain quite frequently but acutely worsened last night and throughout the morning today.  States has been on pain medication for his cancer pain but has not helped much.  Denies any worsening shortness of breath.  States that he wears oxygen  when he is exerting himself frequently and has not had to have increased in his O2 requirement.  Denies nausea, vomiting, diaphoresis, leg swelling  Physical exam is notable for: - Mild tenderness palpation of the left chest wall.  Diminished lung sounds in the left lung base.  No lower extremity edema  On my initial evaluation, patient is:  -Vital signs stable. Patient afebrile, hemodynamically stable, and non-toxic appearing. -Additional history obtained from chart review  Differentials include pleural effusion, pneumonia, ACS, PE, dissection, pneumothorax, hemothorax, costochondritis.    On my initial assessment patient appears comfortable sitting in bed on 2 L nasal cannula, saturating well without increased work.  He has some mild tenderness to palpation but states he feels like his pain is deeper.  He is concerned this is just worsening of his pain from his cancer.  Denies any exertional component however a believe this warrants ACS workup.  CXR as below concerning for worsening of known pleural effusion.  Also given cancer history cannot PERC out.  Moderate risk [place].  Discussed with patient he states understanding.  However give pushback with phlebotomy and nursing about receiving an IV.  After further discussion patient is agreeable.  CT scans were obtained and are as below.  It appears his pain and discomfort are likely secondary to worsening disease.  He has no new O2 requirement and is not  having desaturations or increased work of breathing.  I believe he is appropriate for discharge I discussed it with the patient at bedside who states his pain is under better control now.  I made some changes to her pain regiment and discharged him in stable condition with strict return precautions and instructions to follow-up with his PCP  Interpretations, interventions, and the patient's course of care are documented below.    Clinical Course as of 11/24/23 2104  Sat Nov 24, 2023  1705 Hemoglobin(!): 12.4 Improved from prior [RC]  1705 WBC: 8.9 No leukocytosis [RC]  1705 CMP unremarkable [RC]  1705 Troponin I (High Sensitivity): 6 [RC]  1705 DG Chest 2 View Left-sided loculated pleural effusion increasing in size from prior [RC]  1706 ED EKG NSR with normal axis and intervals.  Submillimeter STE in V1 without regional ischemic pattern.  No ST depressions or TW [RC]  2059 CT Angio Chest PE W and/or Wo Contrast Worsening metastatic disease, with worsening left lower pleural burden. [RC]  2100 CT ABDOMEN PELVIS W CONTRAST New liver metastasis [RC]    Clinical Course User Index [RC] Sharyne Darina RAMAN, MD      Disposition:  I discussed the plan for discharge with the patient and/or their surrogate at bedside prior to discharge and they were in agreement with the plan and verbalized understanding of the return precautions provided. All questions answered to the best of my ability. Ultimately, the patient was discharged in stable condition with stable vital signs. I am reassured that they are capable of close follow up and good social support at home.  Clinical Impression:  1. Malignant neoplasm of lower lobe of left lung (HCC)   2. Pleural effusion     Rx / DC Orders ED Discharge Orders          Ordered    oxyCODONE  (OXY IR/ROXICODONE ) 5 MG immediate release tablet  Every 4 hours PRN        11/24/23 2101            The plan for this patient was discussed with Dr.  Freddi, who voiced agreement and who oversaw evaluation and treatment of this patient.   Clinical Complexity A medically appropriate history, review of systems, and physical exam was performed.  My independent interpretations of EKG, labs, and radiology are documented in the ED course above.   If decision rules were used in this patient's evaluation, they are listed below.   Click here for ABCD2, HEART and other calculatorsREFRESH Note before signing   Patient's presentation is most consistent with acute presentation with potential threat to life or bodily function.  Medical Decision Making Amount and/or Complexity of Data Reviewed Labs:  Decision-making details documented in ED Course. Radiology: ordered. Decision-making details documented in ED Course. ECG/medicine tests:  Decision-making details documented in ED Course.  Risk Prescription drug management.    HPI/ROS      See MDM section for pertinent HPI and ROS. A complete ROS was performed with pertinent positives/negatives noted above.   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 Lynwood Nasuti   History of radiation therapy    Left lung- 09/11/23-09/18/23- Dr. 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   Protein calorie malnutrition (HCC)    Small cell lung cancer, 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   Thrombocytopenia (HCC) 06/2018    Past Surgical History:  Procedure Laterality Date   BIOPSY  12/21/2022   Procedure: BIOPSY;  Surgeon: Neda Jennet LABOR, MD;  Location: [facility];  Service: Endoscopy;;   BRONCHIAL WASHINGS  12/21/2022   Procedure: BRONCHIAL WASHINGS;  Surgeon: Neda Jennet LABOR, MD;  Location: [facility];  Service:  Endoscopy;;   ENDOBRONCHIAL ULTRASOUND Left 12/21/2022   Procedure: ENDOBRONCHIAL ULTRASOUND;  Surgeon: Neda Jennet LABOR, MD;  Location: [facility];  Service: Endoscopy;  Laterality: Left;  Endobronchial ultrasound, biopsy, need fluoroscopy   FINE NEEDLE ASPIRATION  12/21/2022   Procedure: FINE NEEDLE ASPIRATION (FNA) LINEAR;  Surgeon: Neda Jennet LABOR, MD;  Location: [facility];  Service: Endoscopy;;   IR BONE MARROW BIOPSY & ASPIRATION  12/27/2022   IR IMAGING GUIDED PORT INSERTION  12/27/2022   IR LUMBAR PUNCTURE  12/22/2022   IR PERC PLEURAL DRAIN W/INDWELL CATH W/IMG GUIDE  01/15/2023   IR REMOVAL OF PLURAL CATH W/CUFF  03/27/2023   LUMBAR PUNCTURE  06/2018   during eval for meningitis    RADIOLOGY WITH ANESTHESIA N/A 11/01/2023   Procedure: MRI WITH ANESTHESIA;  Surgeon: Radiologist, Medication, MD;  Location: [facility] OR;  Service: Radiology;  Laterality: N/A;   VIDEO BRONCHOSCOPY Left 12/21/2022   Procedure: VIDEO BRONCHOSCOPY WITH FLUORO, endobronchial ultrasound;  Surgeon: Neda Jennet LABOR, MD;  Location: [facility];  Service: Endoscopy;  Laterality: Left;  Schedule for 12/20/2022 12/21/2022      Physical Exam   Vitals:   11/24/23 1701  11/24/23 1715 11/24/23 1853 11/24/23 2015  BP:  (!) 125/92 (!) 139/100 (!) 130/98  Pulse:   96 88  Resp:   16 20  Temp:   98 F (36.7 C)   TempSrc:   Oral   SpO2: 100%  100% 100%  Weight:      Height:        Physical Exam Vitals and nursing note reviewed.  Constitutional:      General: He is not in acute distress.    Appearance: He is well-developed.  HENT:     Head: Normocephalic and atraumatic.  Eyes:     Conjunctiva/sclera: Conjunctivae normal.  Cardiovascular:     Rate and Rhythm: Normal rate and regular rhythm.     Heart sounds: No murmur heard. Pulmonary:     Effort: Pulmonary effort is normal. No tachypnea or respiratory distress.     Breath sounds: Examination of the left-lower field reveals decreased breath sounds.  Decreased breath sounds present. No wheezing or rhonchi.     Comments: On 2 L nasal cannula Abdominal:     Palpations: Abdomen is soft.     Tenderness: There is no abdominal tenderness.  Musculoskeletal:        General: No swelling.     Cervical back: Neck supple.  Skin:    General: Skin is warm and dry.     Capillary Refill: Capillary refill takes less than 2 seconds.  Neurological:     Mental Status: He is alert.  Psychiatric:        Mood and Affect: Mood normal.      Procedures   If procedures were preformed on this patient, they are listed below:  Procedures   @BBSIG @   Please note that this documentation was produced with the assistance of voice-to-text technology and may contain errors.    Sharyne Darina RAMAN, MD 11/24/23 7894    Freddi Hamilton, MD 11/25/23 1536

## 2023-11-26 ENCOUNTER — Encounter: Payer: Self-pay | Admitting: Hematology & Oncology

## 2023-11-26 ENCOUNTER — Inpatient Hospital Stay

## 2023-11-26 ENCOUNTER — Ambulatory Visit
Admission: RE | Admit: 2023-11-26 | Discharge: 2023-11-26 | Disposition: A | Discharge status: Home / Self Care | Source: Ambulatory Visit | Attending: Radiation Oncology | Admitting: Radiation Oncology

## 2023-11-26 ENCOUNTER — Other Ambulatory Visit: Payer: Self-pay

## 2023-11-26 ENCOUNTER — Inpatient Hospital Stay: Admitting: Medical Oncology

## 2023-11-26 DIAGNOSIS — C7A1 Malignant poorly differentiated neuroendocrine tumors: Secondary | ICD-10-CM | POA: Diagnosis not present

## 2023-11-26 DIAGNOSIS — C3412 Malignant neoplasm of upper lobe, left bronchus or lung: Secondary | ICD-10-CM | POA: Diagnosis not present

## 2023-11-26 DIAGNOSIS — Z510 Encounter for antineoplastic radiation therapy: Secondary | ICD-10-CM | POA: Diagnosis not present

## 2023-11-26 LAB — RAD ONC ARIA SESSION SUMMARY
Course Elapsed Days: 5
Plan Fractions Treated to Date: 4
Plan Prescribed Dose Per Fraction: 2.5 Gy
Plan Total Fractions Prescribed: 14
Plan Total Prescribed Dose: 35 Gy
Reference Point Dosage Given to Date: 10 Gy
Reference Point Session Dosage Given: 2.5 Gy
Session Number: 4

## 2023-11-27 ENCOUNTER — Other Ambulatory Visit: Payer: Self-pay

## 2023-11-27 ENCOUNTER — Ambulatory Visit
Admission: RE | Admit: 2023-11-27 | Discharge: 2023-11-27 | Disposition: A | Discharge status: Home / Self Care | Source: Ambulatory Visit | Attending: Radiation Oncology | Admitting: Radiation Oncology

## 2023-11-27 DIAGNOSIS — Z510 Encounter for antineoplastic radiation therapy: Secondary | ICD-10-CM | POA: Diagnosis not present

## 2023-11-27 DIAGNOSIS — C3412 Malignant neoplasm of upper lobe, left bronchus or lung: Secondary | ICD-10-CM | POA: Diagnosis not present

## 2023-11-27 DIAGNOSIS — C7A1 Malignant poorly differentiated neuroendocrine tumors: Secondary | ICD-10-CM | POA: Diagnosis not present

## 2023-11-27 LAB — RAD ONC ARIA SESSION SUMMARY
Course Elapsed Days: 6
Plan Fractions Treated to Date: 5
Plan Prescribed Dose Per Fraction: 2.5 Gy
Plan Total Fractions Prescribed: 14
Plan Total Prescribed Dose: 35 Gy
Reference Point Dosage Given to Date: 12.5 Gy
Reference Point Session Dosage Given: 2.5 Gy
Session Number: 5

## 2023-11-28 ENCOUNTER — Other Ambulatory Visit: Payer: Self-pay

## 2023-11-28 ENCOUNTER — Ambulatory Visit
Admission: RE | Admit: 2023-11-28 | Discharge: 2023-11-28 | Disposition: A | Discharge status: Home / Self Care | Source: Ambulatory Visit | Attending: Radiation Oncology | Admitting: Radiation Oncology

## 2023-11-28 DIAGNOSIS — Z510 Encounter for antineoplastic radiation therapy: Secondary | ICD-10-CM | POA: Diagnosis not present

## 2023-11-28 DIAGNOSIS — C7A1 Malignant poorly differentiated neuroendocrine tumors: Secondary | ICD-10-CM | POA: Diagnosis not present

## 2023-11-28 DIAGNOSIS — C3412 Malignant neoplasm of upper lobe, left bronchus or lung: Secondary | ICD-10-CM | POA: Diagnosis not present

## 2023-11-28 LAB — RAD ONC ARIA SESSION SUMMARY
Course Elapsed Days: 7
Plan Fractions Treated to Date: 6
Plan Prescribed Dose Per Fraction: 2.5 Gy
Plan Total Fractions Prescribed: 14
Plan Total Prescribed Dose: 35 Gy
Reference Point Dosage Given to Date: 12.6485 Gy
Reference Point Session Dosage Given: 0.1485 Gy
Session Number: 6

## 2023-11-29 ENCOUNTER — Ambulatory Visit
Admission: RE | Admit: 2023-11-29 | Discharge: 2023-11-29 | Disposition: A | Discharge status: Home / Self Care | Source: Ambulatory Visit | Attending: Radiation Oncology | Admitting: Radiation Oncology

## 2023-11-29 ENCOUNTER — Other Ambulatory Visit: Payer: Self-pay

## 2023-11-29 DIAGNOSIS — C7A1 Malignant poorly differentiated neuroendocrine tumors: Secondary | ICD-10-CM | POA: Diagnosis not present

## 2023-11-29 LAB — RAD ONC ARIA SESSION SUMMARY
Course Elapsed Days: 8
Plan Fractions Treated to Date: 6
Plan Prescribed Dose Per Fraction: 2.5 Gy
Plan Total Fractions Prescribed: 14
Plan Total Prescribed Dose: 35 Gy
Reference Point Dosage Given to Date: 15 Gy
Reference Point Session Dosage Given: 2.3515 Gy
Session Number: 7

## 2023-11-30 ENCOUNTER — Ambulatory Visit
Admission: RE | Admit: 2023-11-30 | Discharge: 2023-11-30 | Disposition: A | Discharge status: Home / Self Care | Source: Ambulatory Visit | Attending: Radiation Oncology | Admitting: Radiation Oncology

## 2023-11-30 ENCOUNTER — Inpatient Hospital Stay

## 2023-11-30 ENCOUNTER — Ambulatory Visit

## 2023-11-30 ENCOUNTER — Encounter: Payer: Self-pay | Admitting: *Deleted

## 2023-11-30 ENCOUNTER — Encounter: Payer: Self-pay | Admitting: Hematology & Oncology

## 2023-11-30 ENCOUNTER — Other Ambulatory Visit: Payer: Self-pay

## 2023-11-30 ENCOUNTER — Inpatient Hospital Stay: Admitting: Family

## 2023-11-30 VITALS — BP 128/95 | HR 77 | Temp 97.6°F | Resp 20 | Ht 72.0 in

## 2023-11-30 DIAGNOSIS — Z510 Encounter for antineoplastic radiation therapy: Secondary | ICD-10-CM | POA: Diagnosis not present

## 2023-11-30 DIAGNOSIS — C3412 Malignant neoplasm of upper lobe, left bronchus or lung: Secondary | ICD-10-CM | POA: Diagnosis not present

## 2023-11-30 DIAGNOSIS — C7A1 Malignant poorly differentiated neuroendocrine tumors: Secondary | ICD-10-CM

## 2023-11-30 LAB — RAD ONC ARIA SESSION SUMMARY
Course Elapsed Days: 9
Plan Fractions Treated to Date: 7
Plan Prescribed Dose Per Fraction: 2.5 Gy
Plan Total Fractions Prescribed: 14
Plan Total Prescribed Dose: 35 Gy
Reference Point Dosage Given to Date: 17.5 Gy
Reference Point Session Dosage Given: 2.5 Gy
Session Number: 8

## 2023-11-30 MED ORDER — OXYCODONE HCL 5 MG PO TABS
ORAL_TABLET | ORAL | Status: AC
  Filled 2023-11-30: qty 2

## 2023-11-30 MED ORDER — OXYCODONE HCL 5 MG PO TABS
10.0000 mg | ORAL_TABLET | Freq: Once | ORAL | Status: AC
  Administered 2023-11-30: 10 mg via ORAL

## 2023-11-30 NOTE — Progress Notes (Signed)
 Patient was a no-show for his appointment today. He did go to radiation. I spoke to his nurse there and she stated that he didn't mention today's appointment, but that he was in a lot of pain and needed prn meds.   Called and reached patient. He stated he was unaware of today's appointment. He is unable to come today. Transferred to scheduling to reschedule appointment.   Oncology Nurse Navigator Documentation     11/30/2023   10:45 AM  Oncology Nurse Navigator Flowsheets  Navigator Follow Up Date: 12/03/2023  Navigator Follow Up Reason: Follow-up Appointment;Chemotherapy  Financial risk analyst Encounter Type Telephone  Telephone Outgoing Call  Patient Visit Type MedOnc  Treatment Phase Active Tx  Barriers/Navigation Needs Coordination of Care;Education  Education Other  Interventions Coordination of Care  Acuity Level 1-No Barriers  Coordination of Care Appts;Other  Education Method Verbal  Time Spent with Patient 15

## 2023-12-03 ENCOUNTER — Inpatient Hospital Stay: Admitting: Family

## 2023-12-03 ENCOUNTER — Other Ambulatory Visit: Payer: Self-pay

## 2023-12-03 ENCOUNTER — Inpatient Hospital Stay

## 2023-12-03 ENCOUNTER — Encounter: Payer: Self-pay | Admitting: *Deleted

## 2023-12-03 ENCOUNTER — Ambulatory Visit
Admission: RE | Admit: 2023-12-03 | Discharge: 2023-12-03 | Disposition: A | Discharge status: Home / Self Care | Source: Ambulatory Visit | Attending: Radiation Oncology | Admitting: Radiation Oncology

## 2023-12-03 ENCOUNTER — Ambulatory Visit

## 2023-12-03 DIAGNOSIS — C7A1 Malignant poorly differentiated neuroendocrine tumors: Secondary | ICD-10-CM | POA: Diagnosis not present

## 2023-12-03 DIAGNOSIS — C3492 Malignant neoplasm of unspecified part of left bronchus or lung: Secondary | ICD-10-CM | POA: Diagnosis not present

## 2023-12-03 DIAGNOSIS — C3412 Malignant neoplasm of upper lobe, left bronchus or lung: Secondary | ICD-10-CM | POA: Diagnosis not present

## 2023-12-03 DIAGNOSIS — B451 Cerebral cryptococcosis: Secondary | ICD-10-CM | POA: Diagnosis not present

## 2023-12-03 DIAGNOSIS — Z510 Encounter for antineoplastic radiation therapy: Secondary | ICD-10-CM | POA: Diagnosis not present

## 2023-12-03 DIAGNOSIS — J9859 Other diseases of mediastinum, not elsewhere classified: Secondary | ICD-10-CM | POA: Diagnosis not present

## 2023-12-03 LAB — RAD ONC ARIA SESSION SUMMARY
Course Elapsed Days: 12
Plan Fractions Treated to Date: 8
Plan Prescribed Dose Per Fraction: 2.5 Gy
Plan Total Fractions Prescribed: 14
Plan Total Prescribed Dose: 35 Gy
Reference Point Dosage Given to Date: 20 Gy
Reference Point Session Dosage Given: 2.5 Gy
Session Number: 9

## 2023-12-03 NOTE — Progress Notes (Signed)
 Patient is once again a no-show today. He did arrive to his radiation appointment this morning.   Called and spoke to patient. He states he is missing appointments because he can't get to both locations. I asked him if he would prefer to have his medical oncology care be provided at the [facility] location at this is closer to his home. He stated he did NOT want this at this time. He would like to continue care at [facility], but make sure the appointments are opposite his radiation; so afternoon appointments.   He states he needs pain medicine refills and possibly fluid drained from my side. I educated him to how important it was to get to an appointment so these things could be assessed/provided.   Message sent to scheduling to get an appointment for this week.   Oncology Nurse Navigator Documentation     12/03/2023   10:30 AM  Oncology Nurse Navigator Flowsheets  Navigator Follow Up Date: 12/07/2023  Navigator Follow Up Reason: Follow-up Appointment  Navigator Location [facility]  Navigator Encounter Type Appt/Treatment Plan Review  Telephone Outgoing Call  Patient Visit Type MedOnc  Treatment Phase Active Tx  Barriers/Navigation Needs Coordination of Care;Education  Education Other  Interventions Coordination of Care;Education  Acuity Level 1-No Barriers  Coordination of Care Appts  Education Method Verbal  Time Spent with Patient 30

## 2023-12-04 ENCOUNTER — Other Ambulatory Visit: Payer: Self-pay | Admitting: Radiation Oncology

## 2023-12-04 ENCOUNTER — Ambulatory Visit
Admission: RE | Admit: 2023-12-04 | Discharge: 2023-12-04 | Disposition: A | Discharge status: Home / Self Care | Source: Ambulatory Visit | Attending: Radiation Oncology | Admitting: Radiation Oncology

## 2023-12-04 ENCOUNTER — Other Ambulatory Visit: Payer: Self-pay

## 2023-12-04 DIAGNOSIS — Z510 Encounter for antineoplastic radiation therapy: Secondary | ICD-10-CM | POA: Diagnosis not present

## 2023-12-04 DIAGNOSIS — C7A1 Malignant poorly differentiated neuroendocrine tumors: Secondary | ICD-10-CM

## 2023-12-04 DIAGNOSIS — C3412 Malignant neoplasm of upper lobe, left bronchus or lung: Secondary | ICD-10-CM | POA: Diagnosis not present

## 2023-12-04 LAB — RAD ONC ARIA SESSION SUMMARY
Course Elapsed Days: 13
Plan Fractions Treated to Date: 9
Plan Prescribed Dose Per Fraction: 2.5 Gy
Plan Total Fractions Prescribed: 14
Plan Total Prescribed Dose: 35 Gy
Reference Point Dosage Given to Date: 22.5 Gy
Reference Point Session Dosage Given: 2.5 Gy
Session Number: 10

## 2023-12-04 MED ORDER — ONDANSETRON HCL 8 MG PO TABS: 8.0000 mg | ORAL_TABLET | Freq: Three times a day (TID) | ORAL | 1 refills | Status: DC | PRN

## 2023-12-05 ENCOUNTER — Ambulatory Visit: Admitting: Hematology & Oncology

## 2023-12-05 ENCOUNTER — Ambulatory Visit
Admission: RE | Admit: 2023-12-05 | Discharge: 2023-12-05 | Disposition: A | Discharge status: Home / Self Care | Source: Ambulatory Visit | Attending: Radiation Oncology | Admitting: Radiation Oncology

## 2023-12-05 ENCOUNTER — Other Ambulatory Visit

## 2023-12-05 ENCOUNTER — Other Ambulatory Visit: Payer: Self-pay

## 2023-12-05 ENCOUNTER — Inpatient Hospital Stay

## 2023-12-05 ENCOUNTER — Ambulatory Visit

## 2023-12-05 DIAGNOSIS — Z510 Encounter for antineoplastic radiation therapy: Secondary | ICD-10-CM | POA: Diagnosis not present

## 2023-12-05 DIAGNOSIS — C7A1 Malignant poorly differentiated neuroendocrine tumors: Secondary | ICD-10-CM | POA: Diagnosis not present

## 2023-12-05 DIAGNOSIS — C3412 Malignant neoplasm of upper lobe, left bronchus or lung: Secondary | ICD-10-CM | POA: Diagnosis not present

## 2023-12-05 LAB — RAD ONC ARIA SESSION SUMMARY
Course Elapsed Days: 14
Plan Fractions Treated to Date: 10
Plan Prescribed Dose Per Fraction: 2.5 Gy
Plan Total Fractions Prescribed: 14
Plan Total Prescribed Dose: 35 Gy
Reference Point Dosage Given to Date: 25 Gy
Reference Point Session Dosage Given: 2.5 Gy
Session Number: 11

## 2023-12-06 ENCOUNTER — Other Ambulatory Visit: Payer: Self-pay

## 2023-12-06 ENCOUNTER — Ambulatory Visit
Admission: RE | Admit: 2023-12-06 | Discharge: 2023-12-06 | Disposition: A | Discharge status: Home / Self Care | Source: Ambulatory Visit | Attending: Radiation Oncology | Admitting: Radiation Oncology

## 2023-12-06 ENCOUNTER — Ambulatory Visit (HOSPITAL_COMMUNITY): Attending: Hematology & Oncology

## 2023-12-06 DIAGNOSIS — C7A1 Malignant poorly differentiated neuroendocrine tumors: Secondary | ICD-10-CM | POA: Diagnosis not present

## 2023-12-06 DIAGNOSIS — Z510 Encounter for antineoplastic radiation therapy: Secondary | ICD-10-CM | POA: Diagnosis not present

## 2023-12-06 DIAGNOSIS — C3412 Malignant neoplasm of upper lobe, left bronchus or lung: Secondary | ICD-10-CM | POA: Diagnosis not present

## 2023-12-06 LAB — RAD ONC ARIA SESSION SUMMARY
Course Elapsed Days: 15
Plan Fractions Treated to Date: 11
Plan Prescribed Dose Per Fraction: 2.5 Gy
Plan Total Fractions Prescribed: 14
Plan Total Prescribed Dose: 35 Gy
Reference Point Dosage Given to Date: 27.5 Gy
Reference Point Session Dosage Given: 2.5 Gy
Session Number: 12

## 2023-12-07 ENCOUNTER — Encounter: Payer: Self-pay | Admitting: *Deleted

## 2023-12-07 ENCOUNTER — Inpatient Hospital Stay

## 2023-12-07 ENCOUNTER — Other Ambulatory Visit: Payer: Self-pay

## 2023-12-07 ENCOUNTER — Encounter: Payer: Self-pay | Admitting: Hematology & Oncology

## 2023-12-07 ENCOUNTER — Inpatient Hospital Stay (HOSPITAL_BASED_OUTPATIENT_CLINIC_OR_DEPARTMENT_OTHER): Admitting: Hematology & Oncology

## 2023-12-07 ENCOUNTER — Other Ambulatory Visit (HOSPITAL_BASED_OUTPATIENT_CLINIC_OR_DEPARTMENT_OTHER): Payer: Self-pay

## 2023-12-07 ENCOUNTER — Ambulatory Visit
Admission: RE | Admit: 2023-12-07 | Discharge: 2023-12-07 | Disposition: A | Discharge status: Home / Self Care | Source: Ambulatory Visit | Attending: Radiation Oncology | Admitting: Radiation Oncology

## 2023-12-07 ENCOUNTER — Ambulatory Visit (HOSPITAL_COMMUNITY)
Admission: RE | Admit: 2023-12-07 | Discharge: 2023-12-07 | Disposition: A | Discharge status: Home / Self Care | Source: Ambulatory Visit | Attending: Radiation Oncology | Admitting: Radiation Oncology

## 2023-12-07 ENCOUNTER — Ambulatory Visit

## 2023-12-07 VITALS — Ht 72.0 in | Wt 172.2 lb

## 2023-12-07 VITALS — BP 140/96 | HR 73

## 2023-12-07 DIAGNOSIS — H5711 Ocular pain, right eye: Secondary | ICD-10-CM

## 2023-12-07 DIAGNOSIS — J9859 Other diseases of mediastinum, not elsewhere classified: Secondary | ICD-10-CM

## 2023-12-07 DIAGNOSIS — C7931 Secondary malignant neoplasm of brain: Secondary | ICD-10-CM

## 2023-12-07 DIAGNOSIS — Z510 Encounter for antineoplastic radiation therapy: Secondary | ICD-10-CM | POA: Diagnosis not present

## 2023-12-07 DIAGNOSIS — C7A1 Malignant poorly differentiated neuroendocrine tumors: Secondary | ICD-10-CM | POA: Diagnosis not present

## 2023-12-07 DIAGNOSIS — E43 Unspecified severe protein-calorie malnutrition: Secondary | ICD-10-CM

## 2023-12-07 DIAGNOSIS — C3490 Malignant neoplasm of unspecified part of unspecified bronchus or lung: Secondary | ICD-10-CM | POA: Diagnosis not present

## 2023-12-07 DIAGNOSIS — J90 Pleural effusion, not elsewhere classified: Secondary | ICD-10-CM

## 2023-12-07 DIAGNOSIS — C3492 Malignant neoplasm of unspecified part of left bronchus or lung: Secondary | ICD-10-CM

## 2023-12-07 DIAGNOSIS — J910 Malignant pleural effusion: Secondary | ICD-10-CM

## 2023-12-07 DIAGNOSIS — B20 Human immunodeficiency virus [HIV] disease: Secondary | ICD-10-CM

## 2023-12-07 DIAGNOSIS — R0602 Shortness of breath: Secondary | ICD-10-CM

## 2023-12-07 DIAGNOSIS — R059 Cough, unspecified: Secondary | ICD-10-CM

## 2023-12-07 DIAGNOSIS — E538 Deficiency of other specified B group vitamins: Secondary | ICD-10-CM

## 2023-12-07 DIAGNOSIS — R079 Chest pain, unspecified: Secondary | ICD-10-CM | POA: Diagnosis not present

## 2023-12-07 DIAGNOSIS — C3412 Malignant neoplasm of upper lobe, left bronchus or lung: Secondary | ICD-10-CM | POA: Diagnosis not present

## 2023-12-07 DIAGNOSIS — H539 Unspecified visual disturbance: Secondary | ICD-10-CM

## 2023-12-07 LAB — RAD ONC ARIA SESSION SUMMARY
Course Elapsed Days: 16
Plan Fractions Treated to Date: 12
Plan Prescribed Dose Per Fraction: 2.5 Gy
Plan Total Fractions Prescribed: 14
Plan Total Prescribed Dose: 35 Gy
Reference Point Dosage Given to Date: 30 Gy
Reference Point Session Dosage Given: 2.5 Gy
Session Number: 13

## 2023-12-07 LAB — CMP (CANCER CENTER ONLY)
ALT: 42 U/L (ref 0–44)
AST: 39 U/L (ref 15–41)
Albumin: 4.1 g/dL (ref 3.5–5.0)
Alkaline Phosphatase: 97 U/L (ref 38–126)
Anion gap: 12 (ref 5–15)
BUN: 28 mg/dL — ABNORMAL HIGH (ref 6–20)
CO2: 24 mmol/L (ref 22–32)
Calcium: 9.7 mg/dL (ref 8.9–10.3)
Chloride: 104 mmol/L (ref 98–111)
Creatinine: 0.98 mg/dL (ref 0.61–1.24)
GFR, Estimated: >60 mL/min (ref >60)
Glucose, Bld: 132 mg/dL — ABNORMAL HIGH (ref 70–99)
Potassium: 4.6 mmol/L (ref 3.5–5.1)
Sodium: 140 mmol/L (ref 135–145)
Total Bilirubin: 0.3 mg/dL (ref 0.0–1.2)
Total Protein: 6.5 g/dL (ref 6.5–8.1)

## 2023-12-07 LAB — CBC WITH DIFFERENTIAL (CANCER CENTER ONLY)
Abs Immature Granulocytes: 0.23 K/uL — ABNORMAL HIGH (ref 0.00–0.07)
Basophils Absolute: 0 K/uL (ref 0.0–0.1)
Basophils Relative: 0 %
Eosinophils Absolute: 0 K/uL (ref 0.0–0.5)
Eosinophils Relative: 0 %
HCT: 37.2 % — ABNORMAL LOW (ref 39.0–52.0)
Hemoglobin: 12.7 g/dL — ABNORMAL LOW (ref 13.0–17.0)
Immature Granulocytes: 2 %
Lymphocytes Relative: 4 %
Lymphs Abs: 0.5 K/uL — ABNORMAL LOW (ref 0.7–4.0)
MCH: 33.7 pg (ref 26.0–34.0)
MCHC: 34.1 g/dL (ref 30.0–36.0)
MCV: 98.7 fL (ref 80.0–100.0)
Monocytes Absolute: 0.5 K/uL (ref 0.1–1.0)
Monocytes Relative: 4 %
Neutro Abs: 11 K/uL — ABNORMAL HIGH (ref 1.7–7.7)
Neutrophils Relative %: 90 %
Platelet Count: 127 K/uL — ABNORMAL LOW (ref 150–400)
RBC: 3.77 MIL/uL — ABNORMAL LOW (ref 4.22–5.81)
RDW: 18 % — ABNORMAL HIGH (ref 11.5–15.5)
WBC Count: 12.2 K/uL — ABNORMAL HIGH (ref 4.0–10.5)
nRBC: 0.4 % — ABNORMAL HIGH (ref 0.0–0.2)

## 2023-12-07 MED ORDER — OXYCODONE HCL 5 MG PO TABS
ORAL_TABLET | ORAL | Status: AC
  Filled 2023-12-07: qty 2

## 2023-12-07 MED ORDER — KETOROLAC TROMETHAMINE 15 MG/ML IJ SOLN
30.0000 mg | Freq: Once | INTRAMUSCULAR | Status: AC
  Administered 2023-12-07: 30 mg via INTRAVENOUS
  Filled 2023-12-07: qty 2

## 2023-12-07 MED ORDER — OXYCODONE HCL 10 MG PO TABS
10.0000 mg | ORAL_TABLET | ORAL | 0 refills | Status: DC | PRN
  Filled 2023-12-07 (×3): qty 120, 20d supply, fill #0

## 2023-12-07 MED ORDER — ONDANSETRON HCL 4 MG/2ML IJ SOLN
8.0000 mg | Freq: Once | INTRAMUSCULAR | Status: AC
  Administered 2023-12-07: 8 mg via INTRAVENOUS
  Filled 2023-12-07: qty 4

## 2023-12-07 MED ORDER — SODIUM CHLORIDE 0.9 % IV SOLN: INTRAVENOUS | Status: DC

## 2023-12-07 MED ORDER — OXYCODONE HCL 5 MG PO TABS
10.0000 mg | ORAL_TABLET | Freq: Once | ORAL | Status: AC
  Administered 2023-12-07: 10 mg via ORAL

## 2023-12-07 MED ORDER — OXYCODONE HCL ER 15 MG PO T12A
15.0000 mg | EXTENDED_RELEASE_TABLET | Freq: Three times a day (TID) | ORAL | 0 refills | Status: DC
  Filled 2023-12-07 (×2): qty 90, 30d supply, fill #0
  Filled ????-??-??: fill #0

## 2023-12-07 NOTE — Patient Instructions (Signed)
 Dehydration, Adult Dehydration is a condition in which there is not enough water or other fluids in the body. This happens when a person loses more fluids than they take in. Important organs cannot work right without the right amount of fluids. Any loss of fluids from the body can cause dehydration. Dehydration can be mild, worse, or very bad. It should be treated right away to keep it from getting very bad. What are the causes? Conditions that cause loss of water in the body. They include: Watery poop (diarrhea). Vomiting. Sweating a lot. Fever. Infection. Peeing (urinating) a lot. Not drinking enough fluids. Certain medicines, such as medicines that take extra fluid out of the body (diuretics). Lack of safe drinking water. Not being able to get enough water and food. What increases the risk? Having a long-term (chronic) illness that has not been treated the right way, such as: Diabetes. Heart disease. Kidney disease. Being 25 years of age or older. Having a disability. Living in a place that is high above the ground or sea (high in altitude). The thinner, drier air causes more fluid loss. Doing exercises that put stress on your body for a long time. Being active when in hot places. What are the signs or symptoms? Symptoms of dehydration depend on how bad it is. Mild or worse dehydration Thirst. Dry lips or dry mouth. Feeling dizzy or light-headed. Muscle cramps. Passing little pee or dark pee. Pee may be the color of tea. Headache. Very bad dehydration Changes in skin. Skin may: Be cold to the touch (clammy). Be blotchy or pale. Not go back to normal right after you pinch it and let it go. Little or no tears, pee, or sweat. Fast breathing. Low blood pressure. Weak pulse. Pulse that is more than 100 beats a minute when you are sitting still. Other changes, such as: Feeling very thirsty. Eyes that look hollow (sunken). Cold hands and feet. Being confused. Being very  tired (lethargic) or having trouble waking from sleep. Losing weight. Loss of consciousness. How is this treated? Treatment for this condition depends on how bad your dehydration is. Treatment should start right away. Do not wait until your condition gets very bad. Very bad dehydration is an emergency. You will need to go to a hospital. Mild or worse dehydration can be treated at home. You may be asked to: Drink more fluids. Drink an oral rehydration solution (ORS). This drink gives you the right amount of fluids, salts, and minerals (electrolytes). Very bad dehydration can be treated: With fluids through an IV tube. By correcting low levels of electrolytes in the body. By treating the problem that caused your dehydration. Follow these instructions at home: Oral rehydration solution If told by your doctor, drink an ORS: Make an ORS. Use instructions on the package. Start by drinking small amounts, about  cup (120 mL) every 5-10 minutes. Slowly drink more until you have had the amount that your doctor said to have.  Eating and drinking  Drink enough clear fluid to keep your pee pale yellow. If you were told to drink an ORS, finish the ORS first. Then, start slowly drinking other clear fluids. Drink fluids such as: Water. Do not drink only water. Doing that can make the salt (sodium) level in your body get too low. Water from ice chips you suck on. Fruit juice that you have added water to (diluted). Low-calorie sports drinks. Eat foods that have the right amounts of salts and minerals, such as bananas, oranges, potatoes,  tomatoes, or spinach. Do not drink alcohol. Avoid drinks that have caffeine or sugar. These include:: High-calorie sports drinks. Fruit juice that you did not add water to. Soda. Coffee or energy drinks. Avoid foods that are greasy or have a lot of fat or sugar. General instructions Take over-the-counter and prescription medicines only as told by your doctor. Do  not take sodium tablets. Doing that can make the salt level in your body get too high. Return to your normal activities as told by your doctor. Ask your doctor what activities are safe for you. Keep all follow-up visits. Your doctor may check and change your treatment. Contact a doctor if: You have pain in your belly (abdomen) and the pain: Gets worse. Stays in one place. You have a rash. You have a stiff neck. You get angry or annoyed more easily than normal. You are more tired or have a harder time waking than normal. You feel weak or dizzy. You feel very thirsty. Get help right away if: You have any symptoms of very bad dehydration. You vomit every time you eat or drink. Your vomiting gets worse, does not go away, or you vomit blood or green stuff. You are getting treatment, but symptoms are getting worse. You have a fever. You have a very bad headache. You have: Diarrhea that gets worse or does not go away. Blood in your poop (stool). This may cause poop to look black and tarry. No pee in 6-8 hours. Only a small amount of pee in 6-8 hours, and the pee is very dark. You have trouble breathing. These symptoms may be an emergency. Get help right away. Call 911. Do not wait to see if the symptoms will go away. Do not drive yourself to the hospital. This information is not intended to replace advice given to you by your health care provider. Make sure you discuss any questions you have with your health care provider. Document Revised: 10/24/2021 Document Reviewed: 10/24/2021 Elsevier Patient Education  2024 [employer].

## 2023-12-07 NOTE — Progress Notes (Signed)
 Patient was a no-show to his CT scan despite it being schedule right after his radiation at the same location. Dr Timmy notified.   Today patient feels poorly. He is struggling with pain management. He is slightly dehydrated. Symptom management will be provided today.

## 2023-12-07 NOTE — Progress Notes (Signed)
 Hematology and Oncology Follow Up Visit  Teagen Mcleary [id number] 1980/06/02 43 y.o. 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 Koral is in for his follow-up.  He had a chest x-ray this morning.  The chest x-ray showed stable mediastinal changes.  He has some pleural thickening.  Everything looked pretty constant.  There is no areas of worsening ventilation.  He completed radiation therapy to the brain.  I think if he is treatments on 12/04/2023.  He did have a CT angiogram of his chest on 11/24/2023.  Unfortunate, this did show worsening subcarinal and left axillary adenopathy.  Also noted was worsening pleural disease.  There was increase in nodularity at the left lung base and soft tissue nodules posterior to the descending thoracic aorta.  He has had no fever.  He has had no bleeding.  He has had some pain issues.  He ran out of pain medications.  We are trying to see about getting him the pain medications back.  We may have to make some adjustments.  He has had moderate appetite.  He has had no nausea or vomiting.  There has been no issues with rashes.  Has had no leg swelling.  Overall, I would have to say that his performance status is probably ECOG 1.    Wt Readings from Last 3 Encounters:  12/07/23 172 lb 3.2 oz (78.1 kg)  11/24/23 175 lb (79.4 kg)  11/14/23 170 lb (77.1 kg)   Medications:  Current Outpatient Medications:    acetaminophen  (TYLENOL ) 325 MG tablet, Take 2 tablets (650 mg total) by  mouth every 6 (six) hours as needed., Disp: , Rfl:    bictegravir-emtricitabine -tenofovir  AF (BIKTARVY ) 50-200-25 MG TABS tablet, Take 1 tablet by mouth daily., Disp: 30 tablet, Rfl: 11   brimonidine  (ALPHAGAN ) 0.2 % ophthalmic solution, Place 1 drop into the right eye 2 (two) times daily., Disp: 15 mL, Rfl: 1   dorzolamide -timolol  (COSOPT ) 2-0.5 % ophthalmic solution, Place 1 drop into the right eye 2 (two) times daily., Disp: 10 mL, Rfl: 1   folic acid  (FOLVITE ) 1 MG tablet, Take 1 tablet (1 mg total) by mouth daily. Start 7 days before pemetrexed  chemotherapy. Continue until 21 days after pemetrexed  completed., Disp: 100 tablet, Rfl: 3   folic acid  (FOLVITE ) 1 MG tablet, Take 2 tablets (2 mg total) by mouth daily., Disp: 60 tablet, Rfl: 3   megestrol  (MEGACE  ES) 625 MG/5ML suspension, Take 5 mLs (625 mg total) by mouth daily., Disp: 150 mL, Rfl: 4   ondansetron  (ZOFRAN ) 8 MG tablet, Take 1 tablet (8 mg total) by mouth every 8 (eight) hours as needed for nausea or vomiting., Disp: 30 tablet, Rfl: 1   OXYGEN , Inhale 2 L into the lungs daily as needed., Disp: , Rfl:    prednisoLONE  acetate (PRED FORTE ) 1 % ophthalmic suspension, Instill 1 (one) drop into RIGHT eye every hour while awake today, then every 2 (two) hours while awake tomorrow and thereafter (e.g. 6-7 times per day), Disp: 5 mL, Rfl: 0   prochlorperazine  (COMPAZINE ) 10  MG tablet, Take 1 tablet (10 mg total) by mouth every 6 (six) hours as needed for nausea or vomiting., Disp: 30 tablet, Rfl: 1   senna-docusate (SENOKOT-S) 8.6-50 MG tablet, Take 1 tablet by mouth 2 (two) times daily between meals as needed for mild constipation., Disp: , Rfl:    dexamethasone  (DECADRON ) 4 MG tablet, Take 20 mg by mouth today then take 12 mg by mouth once daily thereafter (Patient not taking: Reported on 12/07/2023), Disp: 92 tablet, Rfl: 1   lidocaine -prilocaine  (EMLA ) cream, Apply to affected area once, Disp: 30 g, Rfl: 3   LORazepam  (ATIVAN ) 1 MG tablet,  Take 1 tablet (1 mg total) by mouth every 8 (eight) hours. Take 1 hour prior to radiation treatment, do not drive after taking med (Patient not taking: Reported on 12/07/2023), Disp: 30 tablet, Rfl: 0   oxyCODONE  (OXY IR/ROXICODONE ) 5 MG immediate release tablet, Take 1 tablet (5 mg total) by mouth every 4 (four) hours as needed. (Patient not taking: Reported on 12/07/2023), Disp: 60 tablet, Rfl: 0   oxyCODONE  (OXYCONTIN ) 15 mg 12 hr tablet, Take 1 tablet (15 mg total) by mouth every 12 (twelve) hours. (Patient not taking: Reported on 12/07/2023), Disp: 60 tablet, Rfl: 0  Allergies: No Known Allergies  Past Medical History, Surgical history, Social history, and Family History were reviewed and updated.  Review of Systems: Review of Systems  Constitutional: Negative.   HENT:  Negative.    Eyes:  Positive for eye problems.  Respiratory:  Positive for cough and shortness of breath.   Cardiovascular:  Negative for chest pain.  Gastrointestinal: Negative.   Endocrine: Negative.   Genitourinary: Negative.    Musculoskeletal: Negative.   Skin: Negative.   Neurological:  Positive for headaches.  Hematological: Negative.   Psychiatric/Behavioral: Negative.      Physical Exam:  Vital signs show temperature 98.6.  Pulse 87.  Blood pressure 123/93.  Weight is 172 pounds.     Wt Readings from Last 3 Encounters:  12/07/23 172 lb 3.2 oz (78.1 kg)  11/24/23 175 lb (79.4 kg)  11/14/23 170 lb (77.1 kg)    Physical Exam Vitals reviewed.  HENT:     Head: Normocephalic and atraumatic.  Eyes:     Pupils: Pupils are equal, round, and reactive to light.  Cardiovascular:     Rate and Rhythm: Normal rate and regular rhythm.     Heart sounds: Normal heart sounds.  Pulmonary:     Effort: Pulmonary effort is normal.     Breath sounds: Normal breath sounds.     Comments: His lungs sound relatively clear bilaterally.  Maybe a little bit decreased and breath sounds are on the left lower  lung. Abdominal:     General: Bowel sounds are normal.     Palpations: Abdomen is soft.     Comments: Abdominal exam is soft.  He has good bowel sounds.  There is no fluid wave but his abdomen feels mild to moderately distended. There is no palpable liver or spleen tip.  Musculoskeletal:        General: No tenderness or deformity. Normal range of motion.     Cervical back: Normal range of motion.  Lymphadenopathy:     Cervical: No cervical adenopathy.  Skin:    General: Skin is warm and dry.     Findings: No erythema or rash.  Neurological:     General: No focal deficit present.     Mental Status: He is alert and oriented to  person, place, and time.     Cranial Nerves: No cranial nerve deficit.     Coordination: Coordination normal.     Gait: Gait normal.  Psychiatric:        Behavior: Behavior normal.        Thought Content: Thought content normal.        Judgment: Judgment normal.      Lab Results  Component Value Date   WBC 12.2 (H) 12/07/2023   HGB 12.7 (L) 12/07/2023   HCT 37.2 (L) 12/07/2023   MCV 98.7 12/07/2023   PLT 127 (L) 12/07/2023     Chemistry      Component Value Date/Time   NA 140 12/07/2023 1225   NA 143 02/25/2019 1041   K 4.6 12/07/2023 1225   CL 104 12/07/2023 1225   CO2 24 12/07/2023 1225   BUN 28 (H) 12/07/2023 1225   BUN 8 02/25/2019 1041   CREATININE 0.98 12/07/2023 1225   CREATININE 0.89 12/14/2022 1132      Component Value Date/Time   CALCIUM  9.7 12/07/2023 1225   ALKPHOS 97 12/07/2023 1225   AST 39 12/07/2023 1225   ALT 42 12/07/2023 1225   BILITOT 0.3 12/07/2023 1225      Impression and Plan: Mr. Stambaugh is a very nice 43 year old African-American male. He has what certainly is acting like a large cell neuroendocrine carcinoma.   He has received chemo radiation therapy for the large thoracic mass.  He currently has been on maintenance therapy with Alimta  and Avastin .  Unfortunately, recent CT angiogram shows that he is  progressing.  This is truly a difficult situation.  We are beginning to run out of options as to how we can treat him.  I will have to think about what we can try to do to help his situation.  We will have to see about getting his pain medication approved.  I will probably will have him come back to see us  in another few weeks.  At that point in time, we will figure out how we can try to treat his malignancy.  We have again, our options are becoming less now.  Thankfully, the HIV really has not been a problem.  SABRA   Maude JONELLE Crease, MD 8/29/20251:21 PM

## 2023-12-07 NOTE — Patient Instructions (Signed)

## 2023-12-09 ENCOUNTER — Emergency Department (HOSPITAL_COMMUNITY)

## 2023-12-09 ENCOUNTER — Encounter (HOSPITAL_COMMUNITY): Payer: Self-pay | Admitting: Radiology

## 2023-12-09 ENCOUNTER — Inpatient Hospital Stay (HOSPITAL_COMMUNITY)
Admission: EM | Admit: 2023-12-09 | Discharge: 2023-12-10 | DRG: 158 | Disposition: A | Discharge status: Other Acute Inpatient Hospital | Attending: Internal Medicine | Admitting: Internal Medicine

## 2023-12-09 ENCOUNTER — Other Ambulatory Visit: Payer: Self-pay

## 2023-12-09 DIAGNOSIS — C7A8 Other malignant neuroendocrine tumors: Secondary | ICD-10-CM | POA: Diagnosis present

## 2023-12-09 DIAGNOSIS — M869 Osteomyelitis, unspecified: Secondary | ICD-10-CM | POA: Diagnosis present

## 2023-12-09 DIAGNOSIS — L03211 Cellulitis of face: Secondary | ICD-10-CM | POA: Diagnosis present

## 2023-12-09 DIAGNOSIS — C7931 Secondary malignant neoplasm of brain: Secondary | ICD-10-CM | POA: Diagnosis present

## 2023-12-09 DIAGNOSIS — R918 Other nonspecific abnormal finding of lung field: Secondary | ICD-10-CM | POA: Diagnosis not present

## 2023-12-09 DIAGNOSIS — B20 Human immunodeficiency virus [HIV] disease: Secondary | ICD-10-CM | POA: Diagnosis not present

## 2023-12-09 DIAGNOSIS — Z87891 Personal history of nicotine dependence: Secondary | ICD-10-CM | POA: Diagnosis not present

## 2023-12-09 DIAGNOSIS — J90 Pleural effusion, not elsewhere classified: Secondary | ICD-10-CM | POA: Diagnosis not present

## 2023-12-09 DIAGNOSIS — T451X5A Adverse effect of antineoplastic and immunosuppressive drugs, initial encounter: Secondary | ICD-10-CM | POA: Diagnosis not present

## 2023-12-09 DIAGNOSIS — K029 Dental caries, unspecified: Secondary | ICD-10-CM | POA: Diagnosis present

## 2023-12-09 DIAGNOSIS — Z79899 Other long term (current) drug therapy: Secondary | ICD-10-CM | POA: Diagnosis not present

## 2023-12-09 DIAGNOSIS — X58XXXA Exposure to other specified factors, initial encounter: Secondary | ICD-10-CM | POA: Diagnosis present

## 2023-12-09 DIAGNOSIS — K047 Periapical abscess without sinus: Principal | ICD-10-CM | POA: Diagnosis present

## 2023-12-09 DIAGNOSIS — R079 Chest pain, unspecified: Secondary | ICD-10-CM | POA: Diagnosis not present

## 2023-12-09 DIAGNOSIS — C3490 Malignant neoplasm of unspecified part of unspecified bronchus or lung: Secondary | ICD-10-CM | POA: Diagnosis not present

## 2023-12-09 DIAGNOSIS — Z833 Family history of diabetes mellitus: Secondary | ICD-10-CM | POA: Diagnosis not present

## 2023-12-09 DIAGNOSIS — C7A1 Malignant poorly differentiated neuroendocrine tumors: Secondary | ICD-10-CM | POA: Diagnosis present

## 2023-12-09 DIAGNOSIS — R220 Localized swelling, mass and lump, head: Secondary | ICD-10-CM | POA: Diagnosis not present

## 2023-12-09 DIAGNOSIS — G8929 Other chronic pain: Secondary | ICD-10-CM | POA: Diagnosis present

## 2023-12-09 DIAGNOSIS — Z923 Personal history of irradiation: Secondary | ICD-10-CM | POA: Diagnosis not present

## 2023-12-09 DIAGNOSIS — Z9981 Dependence on supplemental oxygen: Secondary | ICD-10-CM | POA: Diagnosis not present

## 2023-12-09 DIAGNOSIS — R1310 Dysphagia, unspecified: Secondary | ICD-10-CM | POA: Diagnosis present

## 2023-12-09 DIAGNOSIS — Z21 Asymptomatic human immunodeficiency virus [HIV] infection status: Secondary | ICD-10-CM | POA: Diagnosis present

## 2023-12-09 DIAGNOSIS — Z8249 Family history of ischemic heart disease and other diseases of the circulatory system: Secondary | ICD-10-CM

## 2023-12-09 DIAGNOSIS — K122 Cellulitis and abscess of mouth: Secondary | ICD-10-CM | POA: Diagnosis not present

## 2023-12-09 DIAGNOSIS — J910 Malignant pleural effusion: Secondary | ICD-10-CM | POA: Diagnosis not present

## 2023-12-09 DIAGNOSIS — J9859 Other diseases of mediastinum, not elsewhere classified: Secondary | ICD-10-CM | POA: Diagnosis present

## 2023-12-09 DIAGNOSIS — R222 Localized swelling, mass and lump, trunk: Secondary | ICD-10-CM | POA: Diagnosis not present

## 2023-12-09 DIAGNOSIS — J984 Other disorders of lung: Secondary | ICD-10-CM | POA: Diagnosis not present

## 2023-12-09 DIAGNOSIS — M272 Inflammatory conditions of jaws: Secondary | ICD-10-CM | POA: Diagnosis not present

## 2023-12-09 DIAGNOSIS — D696 Thrombocytopenia, unspecified: Secondary | ICD-10-CM | POA: Diagnosis not present

## 2023-12-09 LAB — BASIC METABOLIC PANEL WITH GFR
Anion gap: 13 (ref 5–15)
BUN: 18 mg/dL (ref 6–20)
CO2: 23 mmol/L (ref 22–32)
Calcium: 8.7 mg/dL — ABNORMAL LOW (ref 8.9–10.3)
Chloride: 105 mmol/L (ref 98–111)
Creatinine, Ser: 1 mg/dL (ref 0.61–1.24)
GFR, Estimated: >60 mL/min (ref >60)
Glucose, Bld: 96 mg/dL (ref 70–99)
Potassium: 4 mmol/L (ref 3.5–5.1)
Sodium: 141 mmol/L (ref 135–145)

## 2023-12-09 LAB — CBC
HCT: 42.5 % (ref 39.0–52.0)
Hemoglobin: 14.1 g/dL (ref 13.0–17.0)
MCH: 33.8 pg (ref 26.0–34.0)
MCHC: 33.2 g/dL (ref 30.0–36.0)
MCV: 101.9 fL — ABNORMAL HIGH (ref 80.0–100.0)
Platelets: 117 K/uL — ABNORMAL LOW (ref 150–400)
RBC: 4.17 MIL/uL — ABNORMAL LOW (ref 4.22–5.81)
RDW: 18.2 % — ABNORMAL HIGH (ref 11.5–15.5)
WBC: 8.9 K/uL (ref 4.0–10.5)
nRBC: 0.2 % (ref 0.0–0.2)

## 2023-12-09 LAB — TROPONIN I (HIGH SENSITIVITY)
Troponin I (High Sensitivity): 8 ng/L (ref <18)
Troponin I (High Sensitivity): 6 ng/L (ref <18)

## 2023-12-09 LAB — I-STAT CG4 LACTIC ACID, ED: Lactic Acid, Venous: 1.6 mmol/L (ref 0.5–1.9)

## 2023-12-09 MED ORDER — HYDROMORPHONE HCL 1 MG/ML IJ SOLN
1.0000 mg | Freq: Once | INTRAMUSCULAR | Status: AC
  Administered 2023-12-09: 1 mg via INTRAVENOUS
  Filled 2023-12-09: qty 1

## 2023-12-09 MED ORDER — VANCOMYCIN HCL IN DEXTROSE 1-5 GM/200ML-% IV SOLN
1000.0000 mg | Freq: Once | INTRAVENOUS | Status: AC
  Administered 2023-12-09: 1000 mg via INTRAVENOUS
  Filled 2023-12-09: qty 200

## 2023-12-09 MED ORDER — SODIUM CHLORIDE 0.9 % IV BOLUS
1000.0000 mL | Freq: Once | INTRAVENOUS | Status: AC
  Administered 2023-12-09: 1000 mL via INTRAVENOUS

## 2023-12-09 MED ORDER — SODIUM CHLORIDE 0.9 % IV SOLN
3.0000 g | Freq: Once | INTRAVENOUS | Status: AC
  Administered 2023-12-09: 3 g via INTRAVENOUS
  Filled 2023-12-09: qty 8

## 2023-12-09 MED ORDER — IOHEXOL 350 MG/ML SOLN
75.0000 mL | Freq: Once | INTRAVENOUS | Status: AC | PRN
  Administered 2023-12-09: 75 mL via INTRAVENOUS

## 2023-12-09 NOTE — ED Notes (Signed)
 Patient returned from CT

## 2023-12-09 NOTE — ED Provider Notes (Signed)
 [place] EMERGENCY DEPARTMENT AT [facility] Provider Note   CSN: [id number] Arrival date & time: 12/09/23  8093     Patient presents with: No chief complaint on file.   Christopher Henson is a 43 y.o. male.  With a history of HIV on Biktarvy , lung cancer with known metastases to brain and mediastinum, dental abscesses who presents to the ED for mandibular swelling.  Patient first noted swelling over the left lower jaw last night which has increased in size.  Also reporting chest pain shortness of breath.  Is currently on chemotherapy and followed by Dr.Ennever here with [facility].  No fevers or chills.  Able to eat drink and swallow still.  {Add pertinent medical, surgical, social history, OB history to HPI:32947} HPI     Prior to Admission medications   Medication Sig Start Date End Date Taking? Authorizing Provider  acetaminophen  (TYLENOL ) 325 MG tablet Take 2 tablets (650 mg total) by mouth every 6 (six) hours as needed. 01/17/23   Raenelle Coria, MD  bictegravir-emtricitabine -tenofovir  AF (BIKTARVY ) 50-200-25 MG TABS tablet Take 1 tablet by mouth daily. 11/14/23   Vu, Constance T, MD  brimonidine  (ALPHAGAN ) 0.2 % ophthalmic solution Place 1 drop into the right eye 2 (two) times daily. 11/05/23     dexamethasone  (DECADRON ) 4 MG tablet Take 20 mg by mouth today then take 12 mg by mouth once daily thereafter Patient not taking: Reported on 12/07/2023 10/25/23   Tonette Lauraine HERO, PA-C  dorzolamide -timolol  (COSOPT ) 2-0.5 % ophthalmic solution Place 1 drop into the right eye 2 (two) times daily. 11/05/23     folic acid  (FOLVITE ) 1 MG tablet Take 2 tablets (2 mg total) by mouth daily. 10/04/23   Timmy Maude SAUNDERS, MD  LORazepam  (ATIVAN ) 1 MG tablet Take 1 tablet (1 mg total) by mouth every 8 (eight) hours. Take 1 hour prior to radiation treatment, do not drive after taking med Patient not taking: Reported on 12/07/2023 11/20/23   Shannon Agent, MD  megestrol  (MEGACE  ES) 625 MG/5ML suspension Take 5  mLs (625 mg total) by mouth daily. 11/14/23   Timmy Maude SAUNDERS, MD  oxyCODONE  (OXYCONTIN ) 15 mg 12 hr tablet Take 1 tablet (15 mg total) by mouth every 8 (eight) hours. 12/07/23   Timmy Maude SAUNDERS, MD  Oxycodone  HCl 10 MG TABS Take 1 tablet (10 mg total) by mouth every 4 (four) hours as needed. 12/07/23   Timmy Maude SAUNDERS, MD  OXYGEN  Inhale 2 L into the lungs daily as needed.    [provider]  prednisoLONE  acetate (PRED FORTE ) 1 % ophthalmic suspension Instill 1 (one) drop into RIGHT eye every hour while awake today, then every 2 (two) hours while awake tomorrow and thereafter (e.g. 6-7 times per day) 10/31/23     senna-docusate (SENOKOT-S) 8.6-50 MG tablet Take 1 tablet by mouth 2 (two) times daily between meals as needed for mild constipation. 01/03/23   Samtani, Jai-Gurmukh, MD    Allergies: Patient has no known allergies.    Review of Systems  Updated Vital Signs BP (!) 148/103 (BP Location: Right Arm)   Pulse 91   Temp 98.7 F (37.1 C)   Resp 18   Ht 6' (1.829 m)   Wt 78 kg   SpO2 98%   BMI 23.33 kg/m   Physical Exam Vitals and nursing note reviewed.  HENT:     Head: Normocephalic and atraumatic.     Mouth/Throat:     Comments: White/yellow plaques over tongue not able  to be scraped off Left lower molar abscess Surrounding erythema and induration over the left lower buccal mucosa Palpable edema over left mandible Uvula is midline No pharyngeal erythema No swelling of the floor the mouth Eyes:     Pupils: Pupils are equal, round, and reactive to light.  Cardiovascular:     Rate and Rhythm: Normal rate and regular rhythm.  Pulmonary:     Effort: Pulmonary effort is normal.     Breath sounds: Normal breath sounds.  Abdominal:     Palpations: Abdomen is soft.     Tenderness: There is no abdominal tenderness.  Skin:    General: Skin is warm and dry.  Neurological:     Mental Status: He is alert.  Psychiatric:        Mood and Affect: Mood normal.     (all  labs ordered are listed, but only abnormal results are displayed) Labs Reviewed  CBC - Abnormal; Notable for the following components:      Result Value   RBC 4.17 (*)    MCV 101.9 (*)    RDW 18.2 (*)    Platelets 117 (*)    All other components within normal limits  CULTURE, BLOOD (ROUTINE X 2)  CULTURE, BLOOD (ROUTINE X 2)  BASIC METABOLIC PANEL WITH GFR  I-STAT CG4 LACTIC ACID, ED  TROPONIN I (HIGH SENSITIVITY)    EKG: None  Radiology: DG Chest 2 View Result Date: 12/09/2023 CLINICAL DATA:  Chest pain EXAM: CHEST - 2 VIEW COMPARISON:  12/07/2023, CT 11/24/2023 FINDINGS: Multiple nodular pleural base masses and associated small pleural effusion are again seen within the left hemithorax with associated marked asymmetric left-sided volume loss. Since prior examination, the degree of volume loss has progressed. Right lung is clear. No pneumothorax. No pleural effusion on the right. Right internal jugular chest port tip seen at the superior cavoatrial junction. Cardiac size is at the upper limits of normal, stable. Pulmonary vascularity is normal. No acute bone abnormality. IMPRESSION: 1. Progressive left-sided volume loss. 2. 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 Electronically Signed   By: Dorethia Molt M.D.   On: 12/09/2023 19:41    {Document cardiac monitor, telemetry assessment procedure when appropriate:32947} Procedures   Medications Ordered in the ED  sodium chloride  0.9 % bolus 1,000 mL (has no administration in time range)  vancomycin  (VANCOCIN ) IVPB 1000 mg/200 mL premix (has no administration in time range)  Ampicillin -Sulbactam (UNASYN ) 3 g in sodium chloride  0.9 % 100 mL IVPB (has no administration in time range)  HYDROmorphone  (DILAUDID ) injection 1 mg (1 mg Intravenous Given 12/09/23 2043)      {Click here for ABCD2, HEART and other calculators REFRESH Note  before signing:1}                              Medical Decision Making 43 year old male with history as above presenting for acute on chronic chest pain as well as new left jaw swelling.  Concern for dental abscess especially with his history of HIV and AIDS defining illnesses.  Afebrile hypertensive.  Not meeting SIRS criteria.  Will obtain infectious workup along with CT maxillofacial to better visualize potential left dental abscess.  Will cover with vancomycin  and Unasyn  for presumed odontogenic infection and immunocompromise patient.  Will give Dilaudid  for pain relief.  Admission likely.  Amount and/or Complexity of Data Reviewed Labs:  ordered. Radiology: ordered.  Risk Prescription drug management.     {Document critical care time when appropriate  Document review of labs and clinical decision tools ie CHADS2VASC2, etc  Document your independent review of radiology images and any outside records  Document your discussion with family members, caretakers and with consultants  Document social determinants of health affecting pt's care  Document your decision making why or why not admission, treatments were needed:32947:::1}   Final diagnoses:  None    ED Discharge Orders     None

## 2023-12-09 NOTE — ED Notes (Signed)
 Patient transported to CT

## 2023-12-09 NOTE — ED Triage Notes (Signed)
 Pt having severe chest pain pointing to his epigastric region which he endorses feels like it is swelling. State the pain is worse when he takes a deep breath. Pt also states his left jaw started swelling over night and it is noticeably bigger on the left than right. Pt unable to open his mouth very wide but there is a sore on the inside of his jaw. Pt last chemo dose was the 25th of August.

## 2023-12-10 ENCOUNTER — Ambulatory Visit

## 2023-12-10 ENCOUNTER — Inpatient Hospital Stay (HOSPITAL_COMMUNITY)

## 2023-12-10 ENCOUNTER — Encounter (HOSPITAL_COMMUNITY): Payer: Self-pay | Admitting: Internal Medicine

## 2023-12-10 DIAGNOSIS — L03211 Cellulitis of face: Secondary | ICD-10-CM | POA: Diagnosis not present

## 2023-12-10 DIAGNOSIS — K047 Periapical abscess without sinus: Secondary | ICD-10-CM

## 2023-12-10 DIAGNOSIS — C3490 Malignant neoplasm of unspecified part of unspecified bronchus or lung: Secondary | ICD-10-CM

## 2023-12-10 DIAGNOSIS — H409 Unspecified glaucoma: Secondary | ICD-10-CM | POA: Diagnosis not present

## 2023-12-10 DIAGNOSIS — K122 Cellulitis and abscess of mouth: Secondary | ICD-10-CM | POA: Diagnosis not present

## 2023-12-10 DIAGNOSIS — C7931 Secondary malignant neoplasm of brain: Secondary | ICD-10-CM

## 2023-12-10 DIAGNOSIS — R591 Generalized enlarged lymph nodes: Secondary | ICD-10-CM | POA: Diagnosis not present

## 2023-12-10 DIAGNOSIS — C7A1 Malignant poorly differentiated neuroendocrine tumors: Secondary | ICD-10-CM

## 2023-12-10 DIAGNOSIS — M272 Inflammatory conditions of jaws: Secondary | ICD-10-CM | POA: Diagnosis not present

## 2023-12-10 DIAGNOSIS — J910 Malignant pleural effusion: Secondary | ICD-10-CM | POA: Diagnosis not present

## 2023-12-10 DIAGNOSIS — Z79891 Long term (current) use of opiate analgesic: Secondary | ICD-10-CM | POA: Diagnosis not present

## 2023-12-10 DIAGNOSIS — G893 Neoplasm related pain (acute) (chronic): Secondary | ICD-10-CM | POA: Diagnosis not present

## 2023-12-10 DIAGNOSIS — L0201 Cutaneous abscess of face: Secondary | ICD-10-CM | POA: Diagnosis not present

## 2023-12-10 DIAGNOSIS — Z9221 Personal history of antineoplastic chemotherapy: Secondary | ICD-10-CM | POA: Diagnosis not present

## 2023-12-10 DIAGNOSIS — B20 Human immunodeficiency virus [HIV] disease: Secondary | ICD-10-CM

## 2023-12-10 DIAGNOSIS — Z21 Asymptomatic human immunodeficiency virus [HIV] infection status: Secondary | ICD-10-CM | POA: Diagnosis not present

## 2023-12-10 DIAGNOSIS — J9859 Other diseases of mediastinum, not elsewhere classified: Secondary | ICD-10-CM

## 2023-12-10 DIAGNOSIS — K027 Dental root caries: Secondary | ICD-10-CM | POA: Diagnosis not present

## 2023-12-10 DIAGNOSIS — Z923 Personal history of irradiation: Secondary | ICD-10-CM | POA: Diagnosis not present

## 2023-12-10 LAB — CBC
HCT: 37.6 % — ABNORMAL LOW (ref 39.0–52.0)
Hemoglobin: 12.9 g/dL — ABNORMAL LOW (ref 13.0–17.0)
MCH: 34.1 pg — ABNORMAL HIGH (ref 26.0–34.0)
MCHC: 34.3 g/dL (ref 30.0–36.0)
MCV: 99.5 fL (ref 80.0–100.0)
Platelets: 102 K/uL — ABNORMAL LOW (ref 150–400)
RBC: 3.78 MIL/uL — ABNORMAL LOW (ref 4.22–5.81)
RDW: 18 % — ABNORMAL HIGH (ref 11.5–15.5)
WBC: 9.2 K/uL (ref 4.0–10.5)
nRBC: 0.2 % (ref 0.0–0.2)

## 2023-12-10 LAB — BASIC METABOLIC PANEL WITH GFR
Anion gap: 12 (ref 5–15)
BUN: 12 mg/dL (ref 6–20)
CO2: 26 mmol/L (ref 22–32)
Calcium: 8.4 mg/dL — ABNORMAL LOW (ref 8.9–10.3)
Chloride: 104 mmol/L (ref 98–111)
Creatinine, Ser: 0.99 mg/dL (ref 0.61–1.24)
GFR, Estimated: >60 mL/min (ref >60)
Glucose, Bld: 86 mg/dL (ref 70–99)
Potassium: 4.1 mmol/L (ref 3.5–5.1)
Sodium: 142 mmol/L (ref 135–145)

## 2023-12-10 MED ORDER — BRIMONIDINE TARTRATE 0.2 % OP SOLN
1.0000 [drp] | Freq: Two times a day (BID) | OPHTHALMIC | Status: DC
  Filled 2023-12-10: qty 5

## 2023-12-10 MED ORDER — BICTEGRAVIR-EMTRICITAB-TENOFOV 50-200-25 MG PO TABS
1.0000 | ORAL_TABLET | Freq: Every day | ORAL | Status: DC
  Administered 2023-12-10: 1 via ORAL
  Filled 2023-12-10 (×3): qty 1

## 2023-12-10 MED ORDER — HYDROMORPHONE HCL 1 MG/ML IJ SOLN: 0.5000 mg | INTRAMUSCULAR | Status: DC | PRN

## 2023-12-10 MED ORDER — HYDROMORPHONE HCL 1 MG/ML IJ SOLN
0.5000 mg | INTRAMUSCULAR | Status: DC | PRN
  Administered 2023-12-10 (×3): 0.5 mg via INTRAVENOUS
  Filled 2023-12-10 (×3): qty 0.5

## 2023-12-10 MED ORDER — OXYCODONE HCL 5 MG PO TABS
10.0000 mg | ORAL_TABLET | ORAL | Status: DC | PRN
  Administered 2023-12-10: 10 mg via ORAL
  Filled 2023-12-10: qty 2

## 2023-12-10 MED ORDER — DEXAMETHASONE 4 MG PO TABS: 12.0000 mg | ORAL_TABLET | Freq: Every day | ORAL | Status: DC

## 2023-12-10 MED ORDER — DORZOLAMIDE HCL-TIMOLOL MAL 2-0.5 % OP SOLN
1.0000 [drp] | Freq: Two times a day (BID) | OPHTHALMIC | Status: DC
  Filled 2023-12-10: qty 10

## 2023-12-10 MED ORDER — SODIUM CHLORIDE 0.9 % IV SOLN: 3.0000 g | Freq: Four times a day (QID) | INTRAVENOUS | Status: DC

## 2023-12-10 MED ORDER — LACTATED RINGERS IV SOLN: INTRAVENOUS | Status: DC

## 2023-12-10 MED ORDER — VANCOMYCIN HCL 1250 MG/250ML IV SOLN
1250.0000 mg | Freq: Two times a day (BID) | INTRAVENOUS | Status: DC
  Administered 2023-12-10: 1250 mg via INTRAVENOUS
  Filled 2023-12-10: qty 250

## 2023-12-10 MED ORDER — OXYCODONE HCL ER 15 MG PO T12A
15.0000 mg | EXTENDED_RELEASE_TABLET | Freq: Three times a day (TID) | ORAL | Status: DC
  Administered 2023-12-10: 15 mg via ORAL
  Filled 2023-12-10: qty 1

## 2023-12-10 MED ORDER — FOLIC ACID 1 MG PO TABS
2.0000 mg | ORAL_TABLET | Freq: Every day | ORAL | Status: DC
  Administered 2023-12-10: 2 mg via ORAL
  Filled 2023-12-10: qty 2

## 2023-12-10 MED ORDER — SODIUM CHLORIDE 0.9 % IV SOLN
3.0000 g | Freq: Four times a day (QID) | INTRAVENOUS | Status: DC
  Administered 2023-12-10 (×3): 3 g via INTRAVENOUS
  Filled 2023-12-10 (×4): qty 8

## 2023-12-10 MED ORDER — HYDROMORPHONE HCL 1 MG/ML IJ SOLN
0.5000 mg | INTRAMUSCULAR | Status: DC | PRN
  Administered 2023-12-10 (×2): 0.5 mg via INTRAVENOUS
  Filled 2023-12-10 (×2): qty 0.5

## 2023-12-10 MED ORDER — IOHEXOL 350 MG/ML SOLN
75.0000 mL | Freq: Once | INTRAVENOUS | Status: AC | PRN
  Administered 2023-12-10: 75 mL via INTRAVENOUS

## 2023-12-10 NOTE — Telephone Encounter (Signed)
 Medicine Access Physician (MAP): Transfer Center Request Note  Requesting Physician: Dr Laurence  [facility]: [facility]  Requesting Service: hospitalist  Reason for Transfer: OMFS  Brief Hospital Course:  56M metastatic lung cancer with mets to brain, HIV. Admitted this AM at 2AM. Showed to ER yesterday last evening. 2d ago, facial swelling. Limited ability to open mouth, can open 1.5 fingers now. NPO for potential OR, has some trouble swallowing, eating.   Mandibular abscess, ~4cm, no OMFS surgeons that see pts acutely in the hospital.   ID saw the pt today, checking CD4 and RNA level. Last CD4 Sept 2024, total CD4 356, 19.5% CD4.  August 2025 - Biktarvy , first visit in a long time.   ID consultant there managing abx. Starting vanc and starting unasyn . Plan 6wks IV abx.   Oncology - IVF infusion 12/07/2023, concern for dehydration at that time.  Last seen on Aug 6th. Carbo/taxol /benuvamab/ cycle 6 Oct 20204-Feb 2025. XRT to lung. ECOG 1 beginning of August. Chronically loculated effusion due to malignancy   Single dose dilaudid  yesterday. 0.5mg  x3 today but says could likely tolerate oral oxycodone .   Up in bed reading, non-toxic appearing  0845AM Afebrile 87 120/91 100% on RA  Images in powershare.   Dr Laurence reports that they are ok with accepting back even with a drain so long as they have instructions on management.   Unable to get through for CareEverywhere ID, likely due to holiday. Would attempt to reconnect for CareEverywhere to [facility] on arrival.   COVID19 Concerns: None.  Plan Upon Arrival: ID consult if not on MDK, OMFS consult  Bed Type: Floor Are there any clinical exclusion criteria as of the current date, as defined in the [facility]' Clinical Capability Matrix, to providing the patient's clinical care at [facility]? If yes, please indicate the exclusion criteria: YES, OMFS   Accepting Service: MDK or Appropriate for  medicine services as determined by MAO for regionalization.  Imaging Needed:  Yes.  PowerShare Affiliate:  Already shared in [place], including all imaging from Feb forward. Will defer to admitting team to determine what they want downloaded into PACS.  (What is this?)  Please check under Care Everywhere and/or External Records in the patient's Media Tab to see if a discharge summary has been previously sent electronically.    Once the patient's quaternary care center evaluation and treatment are completed, the requesting Physician and Hospital HAVE agreed to accept the patient on back transfer if the patient requires ongoing hospitalization that does not require continued quaternary level care at [facility].

## 2023-12-10 NOTE — Assessment & Plan Note (Addendum)
 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 Followed by oncology. Still getting active chemo. Oncology thinking about XRT to his brain at some point.

## 2023-12-10 NOTE — Hospital Course (Addendum)
 CC: left facial pain and swelling HPI: Christopher Henson is a 43 y.o. 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  Some difficulty swallowing.  Denies fever or chills or difficulty breathing.  Patient also has some mild chest discomfort around the lower part of the sternum for the last few months.  On deep inspiration.   ED Course: In the ER patient had CT maxillofacial done which shows concerning features for left facial cellulitis and also odontogenic abscess on the buccal side.  Patient was started on empiric antibiotics admitted for further management.  Labs show platelets of 117 hemoglobin 14.1 WBC 8.9 troponin 8.  Chest x-ray shows pleural effusion on the left side.  EKG shows normal sinus rhythm.  Patient admitted for further management.  Significant Events: Admitted 12/09/2023 left facial cellulitis with odontogenic abscess 12-10-2023 verified that [facility] does not have oral surgery or dental coverage at all for month of September.  ID consulted. Spoke with Dr. Lampman with [facility] who has accepted patient in transfer. [facility] does not have oral surgery service. [facility] declined transfer. IV ABX narrowed to IV Unasyn  only. IV Vanco stopped. Verified with radiology department that pt's images since April 2025 have been Power-Shared with [facility].  Admission Labs: WBC 8.9, HgB 14.1, plt 117 Na 141, K 4.0, CO2 of 23, BUN 18, Scr 1.0, glu 96 Lactic acid 1.6  Admission Imaging Studies: CXR Progressive left-sided volume loss. 2. Multiple nodular pleural base masses and associated small pleural effusion within the left hemithorax, likely reflecting pleural metastatic disease CT face Soft tissue swelling with inflammatory stranding involving the soft tissues of the left lower face, concerning for acute  infection/cellulitis. Superimposed 3.6 x 1.8 x 3.6 cm odontogenic abscess along the buccal aspect of the left mandibular body as above. 2. Poor dentition with innumerable dental caries CTPA No CT evidence for acute pulmonary embolus.2. Similar appearance of loculated pleural fluid in the left hemithorax with nodular character in some regions suggesting underlying pleural nodularity. 3. Mediastinal, left axillary, and upper abdominal lymphadenopathy. 4. Interval progression of hepatic metastatic disease. 5. Large left adrenal metastasis. 6. Similar appearance of sclerotic lesions in the sternum with sclerotic lesions in the upper lumbar spine consistent with metastatic disease but not included in the field of view for the previous chest CT. 7. Scattered tiny pulmonary nodules bilaterally. Nodules in the right apex appear progressive in the interval.  Significant Labs:   Significant Imaging Studies:   Antibiotic Therapy: Anti-infectives (From admission, onward)    Start     Dose/Rate Route Frequency Ordered Stop   12/10/23 1000  bictegravir-emtricitabine -tenofovir  AF (BIKTARVY ) 50-200-25 MG per tablet 1 tablet        1 tablet Oral Daily 12/10/23 0204     12/10/23 0800  vancomycin  (VANCOREADY) IVPB 1250 mg/250 mL        1,250 mg 166.7 mL/hr over 90 Minutes Intravenous Every 12 hours 12/10/23 0619     12/10/23 0215  Ampicillin -Sulbactam (UNASYN ) 3 g in sodium chloride  0.9 % 100 mL IVPB        3 g 200 mL/hr over 30 Minutes Intravenous Every 6 hours 12/10/23 0209     12/09/23 2030  vancomycin  (VANCOCIN ) IVPB 1000 mg/200 mL premix        1,000 mg 200 mL/hr over 60 Minutes Intravenous  Once 12/09/23 2025 12/09/23 2256   12/09/23 2030  Ampicillin -Sulbactam (UNASYN ) 3 g in sodium chloride  0.9 % 100 mL IVPB        3 g 200 mL/hr over 30 Minutes Intravenous  Once 12/09/23 2027 12/09/23 2133       Procedures:   Consultants:

## 2023-12-10 NOTE — Subjective & Objective (Signed)
 Pt seen and examined. Verified that there is no on-call oral surgeon or dentist available for the entire week. Discussed with CMO Dr. Kimble. Will initiate transfer to tertiary care center where oral surgery is available. Have asked ID to see patient to manage his HIV meds and IV ABX. His oncologist Dr. Timmy placed on pt's care team list to notify oncology of his admission to hospital.  Pt made aware that he needs transfer to tertiary care center. Discussed with [facility]. They do not have oral surgery available. Have reached out to [facility] transfer line.

## 2023-12-10 NOTE — Progress Notes (Signed)
 Christopher Henson to be transferred to [facility] per MD order. Discussed plan with the patient and [facility]. Patient verbalized understanding.  Skin clean, dry and intact without evidence of skin break down, no evidence of skin tears noted. IV catheter in L FA intact. Site without signs and symptoms of complications.  Pt complaining of pain Dilaudid  IV giver per order. Patient free drains and wounds.   EMTALA was printed and given to the transporting nurse. Patient escorted via stretcher by [facility].  Christopher Ruscitti Lavern, RN

## 2023-12-10 NOTE — Progress Notes (Signed)
 Pharmacy Antibiotic Note  Christopher Henson is a 43 y.o. male admitted on 12/09/2023 with mandibular swelling.  Pharmacy has been consulted for vancomycin  dosing for cellulitis.  -WBC WNL, sCr 1 (~bl), afebrile -CT: odontogenic abscess -Blood cultures collected  Plan: -Unasyn  3g IV every 6 hours -Vancomycin  1250mg  IV every 12 hours (AUC 499, Vd 0.72, IBW, sCr 1) -Monitor renal function -Follow up signs of clinical improvement, LOT, de-escalation of antibiotics   Height: 6' (182.9 cm) Weight: 77.8 kg (171 lb 8.3 oz) IBW/kg (Calculated) : 77.6  Temp (24hrs), Avg:98.6 F (37 C), Min:98.2 F (36.8 C), Max:98.8 F (37.1 C)  Recent Labs  Lab 12/07/23 1225 12/09/23 1918 12/09/23 2038  WBC 12.2* 8.9  --   CREATININE 0.98 1.00  --   LATICACIDVEN  --   --  1.6    Estimated Creatinine Clearance: 102.4 mL/min (by C-G formula based on SCr of 1 mg/dL).    No Known Allergies  Antimicrobials this admission: Unasyn  8/31 >>  Vancomycin  8/31 >>   Microbiology results: 8/31 BCx:   Thank you for allowing pharmacy to be a part of this patient's care.  Lynwood Poplar, PharmD, BCPS Clinical Pharmacist 12/10/2023 6:17 AM

## 2023-12-10 NOTE — Progress Notes (Signed)
 PROGRESS NOTE    Christopher Henson  FMW:[id number] DOB: [birth year] DOA: 12/09/2023 PCP: Bulah Alm RAMAN, PA-C  Subjective: Pt seen and examined. Verified that there is no on-call oral surgeon or dentist available for the entire week. Discussed with CMO Dr. Kimble. Will initiate transfer to tertiary care center where oral surgery is available. Have asked ID to see patient to manage his HIV meds and IV ABX. His oncologist Dr. Timmy placed on pt's care team list to notify oncology of his admission to hospital.  Pt made aware that he needs transfer to tertiary care center. Discussed with [facility]. They do not have oral surgery available. Have reached out to [facility] transfer line.   Hospital Course: CC: left facial pain and swelling HPI: Christopher Henson is a 43 y.o. 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  Some difficulty swallowing.  Denies fever or chills or difficulty breathing.  Patient also has some mild chest discomfort around the lower part of the sternum for the last few months.  On deep inspiration.   ED Course: In the ER patient had CT maxillofacial done which shows concerning features for left facial cellulitis and also odontogenic abscess on the buccal side.  Patient was started on empiric antibiotics admitted for further management.  Labs show platelets of 117 hemoglobin 14.1 WBC 8.9 troponin 8.  Chest x-ray shows pleural effusion on the left side.  EKG shows normal sinus rhythm.  Patient admitted for further management.  Significant Events: Admitted 12/09/2023 left facial cellulitis with odontogenic abscess   Admission Labs: WBC 8.9, HgB 14.1, plt 117 Na 141, K 4.0, CO2 of 23, BUN 18, Scr 1.0, glu 96 Lactic acid 1.6  Admission Imaging Studies: CXR Progressive left-sided volume loss. 2. Multiple nodular  pleural base masses and associated small pleural effusion within the left hemithorax, likely reflecting pleural metastatic disease CT face Soft tissue swelling with inflammatory stranding involving the soft tissues of the left lower face, concerning for acute infection/cellulitis. Superimposed 3.6 x 1.8 x 3.6 cm odontogenic abscess along the buccal aspect of the left mandibular body as above. 2. Poor dentition with innumerable dental caries CTPA No CT evidence for acute pulmonary embolus.2. Similar appearance of loculated pleural fluid in the left hemithorax with nodular character in some regions suggesting underlying pleural nodularity. 3. Mediastinal, left axillary, and upper abdominal lymphadenopathy. 4. Interval progression of hepatic metastatic disease. 5. Large left adrenal metastasis. 6. Similar appearance of sclerotic lesions in the sternum with sclerotic lesions in the upper lumbar spine consistent with metastatic disease but not included in the field of view for the previous chest CT. 7. Scattered tiny pulmonary nodules bilaterally. Nodules in the right apex appear progressive in the interval.  Significant Labs:   Significant Imaging Studies:   Antibiotic Therapy: Anti-infectives (From admission, onward)    Start     Dose/Rate Route Frequency Ordered Stop   12/10/23 1000  bictegravir-emtricitabine -tenofovir  AF (BIKTARVY ) 50-200-25 MG per tablet 1 tablet        1 tablet Oral Daily 12/10/23 0204     12/10/23 0800  vancomycin  (VANCOREADY) IVPB 1250 mg/250 mL        1,250 mg 166.7 mL/hr over 90 Minutes Intravenous Every 12 hours 12/10/23 0619     12/10/23 0215  Ampicillin -Sulbactam (UNASYN ) 3 g in sodium chloride  0.9 % 100 mL IVPB  3 g 200 mL/hr over 30 Minutes Intravenous Every 6 hours 12/10/23 0209     12/09/23 2030  vancomycin  (VANCOCIN ) IVPB 1000 mg/200 mL premix        1,000 mg 200 mL/hr over 60 Minutes Intravenous  Once 12/09/23 2025 12/09/23 2256   12/09/23 2030   Ampicillin -Sulbactam (UNASYN ) 3 g in sodium chloride  0.9 % 100 mL IVPB        3 g 200 mL/hr over 30 Minutes Intravenous  Once 12/09/23 2027 12/09/23 2133       Procedures:   Consultants:     Assessment and Plan: * Mandibular abscess 12-10-2023 CT maxillofacial shows Superimposed rim enhancing hypodense collection along the buccal aspect of the left mandibular body measures 3.6 x 1.8 x 3.6 cm, consistent with odontogenic abscess (series 3, image 22). Given his HIV status, lung cancer undergoing active chemo treatment, I believe he needs urgent evaluation and management of his dental abscess while he is admitted to the hospital. Hopefully he can get transferred to tertiary care center that has oral surgery available.  Verified that there is no on-call oral surgeon or dentist available for the entire week. Discussed with CMO Dr. Kimble. Will initiate transfer to tertiary care center where oral surgery is available. Have asked ID to see patient to manage his HIV meds and IV ABX. His oncologist Dr. Timmy placed on pt's care team list to notify oncology of his admission to hospital. Pt made aware that he needs transfer to tertiary care center. Discussed with [facility]. They do not have oral surgery available. Have reached out to [facility] and [facility] center transfer line. Continue with IV unasyn /Vanco for now. ID will see him and adjust his ABX accordingly.  Facial cellulitis 12-10-2023 CT maxillofacial shows Superimposed rim enhancing hypodense collection along the buccal aspect of the left mandibular body measures 3.6 x 1.8 x 3.6 cm, consistent with odontogenic abscess (series 3, image 22). Given his HIV status, lung cancer undergoing active chemo treatment, I believe he needs urgent evaluation and management of his dental abscess while he is admitted to the hospital. Hopefully he can get transferred to tertiary care center that has oral surgery available.  Verified that there is no  on-call oral surgeon or dentist available for the entire week. Discussed with CMO Dr. Kimble. Will initiate transfer to tertiary care center where oral surgery is available. Have asked ID to see patient to manage his HIV meds and IV ABX. His oncologist Dr. Timmy placed on pt's care team list to notify oncology of his admission to hospital. Pt made aware that he needs transfer to tertiary care center. Discussed with [facility]. They do not have oral surgery available. Have reached out to [facility] and [facility] center transfer line. Continue with IV unasyn /Vanco for now. ID will see him and adjust his ABX accordingly.  HIV (human immunodeficiency virus infection) (HCC) 12-10-2023 ID consulted for HIV med management.  Primary malignant neoplasm of lung with metastasis to brain [facility]) 12-10-2023 he has stage 4 lung cancer with brain mets. Followed by oncology. Still getting active chemo. Oncology thinking about XRT to his brain at some point.  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 Followed by oncology. Still getting active chemo. Oncology thinking about XRT to his brain at some point.  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 Followed by oncology. Still getting active chemo. Oncology thinking about XRT to  his brain at some point. CTPA shows stable right sided loculated effusion. He had a malignant left sided effusion in 12-2022 that was drained by thoracentesis.  Mediastinal mass 12-10-2023 he has stage 4 lung cancer with brain mets. Followed by oncology. Still getting active chemo. Oncology thinking about XRT to his brain at some point.  Thrombocytopenia (HCC) 12-10-2023 plt ct 102K today. SCD for DVT prophylaxis.  DVT prophylaxis: SCDs Start: 12/10/23 0204    Code Status: Full Code Family Communication: no family at bedside. He is decisional Disposition Plan: unknown. Reason for  continuing need for hospitalization: remains on IV ABX. Will attempt transfer to [facility].  Objective: Vitals:   12/09/23 2320 12/10/23 0104 12/10/23 0429 12/10/23 0843  BP: (!) 129/97 (!) 145/95 (!) 140/96 (!) 128/91  Pulse: 90 77 89 87  Resp: 10 16 13 18   Temp: 98.8 F (37.1 C) 98.2 F (36.8 C) 98.5 F (36.9 C) 99.3 F (37.4 C)  TempSrc: Oral Oral Oral Oral  SpO2: 100% 97% 100% 100%  Weight:   77.8 kg   Height:        Intake/Output Summary (Last 24 hours) at 12/10/2023 1126 Last data filed at 12/10/2023 0600 Gross per 24 hour  Intake 258.01 ml  Output --  Net 258.01 ml   Filed Weights   12/09/23 1916 12/10/23 0429  Weight: 78 kg 77.8 kg    Examination:  Physical Exam Vitals and nursing note reviewed.  Constitutional:      Comments: Chronically ill appearing  HENT:     Head: Normocephalic and atraumatic.     Mouth/Throat:     Comments: Able to open his mouth only 1.5 fingers width wide.  Tender and fluctuant fluid collection on left lower mandibular/cheek area.  Cardiovascular:     Rate and Rhythm: Normal rate and regular rhythm.  Pulmonary:     Effort: Pulmonary effort is normal.     Breath sounds: Normal breath sounds.  Abdominal:     General: Bowel sounds are normal.     Palpations: Abdomen is soft.  Skin:    General: Skin is warm and dry.     Capillary Refill: Capillary refill takes less than 2 seconds.  Neurological:     Mental Status: He is alert and oriented to person, place, and time.    Data Reviewed: I have personally reviewed following labs and imaging studies  CBC: Recent Labs  Lab 12/07/23 1225 12/09/23 1918 12/10/23 0610  WBC 12.2* 8.9 9.2  NEUTROABS 11.0*  --   --   HGB 12.7* 14.1 12.9*  HCT 37.2* 42.5 37.6*  MCV 98.7 101.9* 99.5  PLT 127* 117* 102*   Basic Metabolic Panel: Recent Labs  Lab 12/07/23 1225 12/09/23 1918 12/10/23 0610  NA 140 141 142  K 4.6 4.0 4.1  CL 104 105 104  CO2 24 23 26   GLUCOSE 132* 96 86  BUN 28* 18 12   CREATININE 0.98 1.00 0.99  CALCIUM  9.7 8.7* 8.4*   GFR: Estimated Creatinine Clearance: 103.4 mL/min (by C-G formula based on SCr of 0.99 mg/dL). Liver Function Tests: Recent Labs  Lab 12/07/23 1225  AST 39  ALT 42  ALKPHOS 97  BILITOT 0.3  PROT 6.5  ALBUMIN  4.1   BNP (last 3 results) Recent Labs    11/02/23 0955  BNP 42.5   Sepsis Labs: Recent Labs  Lab 12/09/23 2038  LATICACIDVEN 1.6    Recent Results (from the past 240 hours)  Culture, blood (routine x 2)  Status: None (Preliminary result)   Collection Time: 12/09/23  8:30 PM   Specimen: BLOOD RIGHT ARM  Result Value Ref Range Status   Specimen Description BLOOD RIGHT ARM  Final   Special Requests   Final    BOTTLES DRAWN AEROBIC AND ANAEROBIC Blood Culture adequate volume   Culture   Final    NO GROWTH < 12 HOURS Performed at [facility], [address] [place], [place] [address]    Report Status PENDING  Incomplete  Culture, blood (routine x 2)     Status: None (Preliminary result)   Collection Time: 12/09/23  8:40 PM   Specimen: BLOOD  Result Value Ref Range Status   Specimen Description BLOOD LEFT ANTECUBITAL  Final   Special Requests   Final    BOTTLES DRAWN AEROBIC AND ANAEROBIC Blood Culture results may not be optimal due to an inadequate volume of blood received in culture bottles   Culture   Final    NO GROWTH < 12 HOURS Performed at [facility], [address] [place], [place] [address]    Report Status PENDING  Incomplete     Radiology Studies: CT Angio Chest Pulmonary Embolism (PE) W or WO Contrast Result Date: 12/10/2023 CLINICAL DATA:  Severe chest pain. History of lung cancer. * Tracking Code: BO * EXAM: CT ANGIOGRAPHY CHEST WITH CONTRAST TECHNIQUE: Multidetector CT imaging of the chest was performed using the standard protocol during bolus administration of intravenous contrast. Multiplanar CT image reconstructions and MIPs were obtained to evaluate the vascular anatomy.  RADIATION DOSE REDUCTION: This exam was performed according to the departmental dose-optimization program which includes automated exposure control, adjustment of the mA and/or kV according to patient size and/or use of iterative reconstruction technique. CONTRAST:  75mL OMNIPAQUE  IOHEXOL  350 MG/ML SOLN COMPARISON:  11/24/2023 FINDINGS: Cardiovascular: The heart size is normal. No substantial pericardial effusion. Trace pericardial effusion evident. Coronary artery calcification is evident. No thoracic aortic aneurysm. No substantial atherosclerotic calcification of the thoracic aorta. There is no filling defect within the opacified pulmonary arteries to suggest the presence of an acute pulmonary embolus. Mediastinum/Nodes: Pre-vascular mass measured previously at 5.2 x 3.8 cm is 5.2 x 4.1 cm today on 58/5. Lesion posterior to the aortic arch measured previously at 1.8 x 1.4 is 1.9 x 1.9 cm today on 54/5. Subcarinal lymph node measured previously at 1.9 cm short axis is 2.4 cm short axis today on 89/5. Right hilar lymphadenopathy again noted. Similar left axillary lymphadenopathy given differential arm positioning. Loculated pleural fluid in the right hemithorax is similar. Lungs/Pleura: Similar left apical scarring. Collapse/consolidation in the left lung base is similar. Scattered tiny pulmonary nodules bilaterally. Nodules in the right apex appear progressive in the interval (see 6 mm right paraspinal nodule on 31/7. As above, there is loculated pleural fluid in the left hemithorax with nodular character in some regions suggesting underlying pleural nodularity. Upper Abdomen: Multiple liver metastases again noted. 3 cm lesion posterior left hepatic lobe on 129/5 was 2.4 cm previously (remeasured). 3.7 cm lesion lateral right hepatic dome was 2.2 cm previously remeasured. Large left adrenal metastasis evident. Upper abdominal lymphadenopathy is similar. Musculoskeletal: Sclerotic lesions are seen in the sternum  and upper lumbar spine consistent with metastatic disease. Review of the MIP images confirms the above findings. IMPRESSION: 1. No CT evidence for acute pulmonary embolus. 2. Similar appearance of loculated pleural fluid in the left hemithorax with nodular character in some regions suggesting underlying pleural nodularity. 3.  Mediastinal, left axillary, and upper abdominal lymphadenopathy. 4. Interval progression of hepatic metastatic disease. 5. Large left adrenal metastasis. 6. Similar appearance of sclerotic lesions in the sternum with sclerotic lesions in the upper lumbar spine consistent with metastatic disease but not included in the field of view for the previous chest CT. 7. Scattered tiny pulmonary nodules bilaterally. Nodules in the right apex appear progressive in the interval. Electronically Signed   By: Camellia Candle M.D.   On: 12/10/2023 05:10   CT Maxillofacial W Contrast Result Date: 12/09/2023 CLINICAL DATA:  Initial evaluation for acute left facial swelling. EXAM: CT MAXILLOFACIAL WITH CONTRAST TECHNIQUE: Multidetector CT imaging of the maxillofacial structures was performed with intravenous contrast. Multiplanar CT image reconstructions were also generated. RADIATION DOSE REDUCTION: This exam was performed according to the departmental dose-optimization program which includes automated exposure control, adjustment of the mA and/or kV according to patient size and/or use of iterative reconstruction technique. CONTRAST:  75mL OMNIPAQUE  IOHEXOL  350 MG/ML SOLN COMPARISON:  None Available. FINDINGS: Osseous: No acute osseous abnormality. No discrete or worrisome osseous lesions. Orbits: Globes orbital soft tissues within normal limits. Sinuses: Mild chronic mucoperiosteal thickening present about the ethmoidal air cells and maxillary sinuses. Paranasal sinuses are otherwise largely clear. Mastoid air cells and middle ear cavities are well pneumatized and free of fluid. Soft tissues: Soft tissue  swelling with inflammatory stranding seen involving the soft tissues of the left lower face, concerning for acute infection/cellulitis. Poor dentition with innumerable dental caries, suggesting an odontogenic origin. Superimposed rim enhancing hypodense collection along the buccal aspect of the left mandibular body measures 3.6 x 1.8 x 3.6 cm, consistent with odontogenic abscess (series 3, image 22). No extension of infection into the deeper spaces of the neck at this time. Limited intracranial: No acute finding. IMPRESSION: 1. Soft tissue swelling with inflammatory stranding involving the soft tissues of the left lower face, concerning for acute infection/cellulitis. Superimposed 3.6 x 1.8 x 3.6 cm odontogenic abscess along the buccal aspect of the left mandibular body as above. 2. Poor dentition with innumerable dental caries. Electronically Signed   By: Morene Hoard M.D.   On: 12/09/2023 21:51   DG Chest 2 View Result Date: 12/09/2023 CLINICAL DATA:  Chest pain EXAM: CHEST - 2 VIEW COMPARISON:  12/07/2023, CT 11/24/2023 FINDINGS: Multiple nodular pleural base masses and associated small pleural effusion are again seen within the left hemithorax with associated marked asymmetric left-sided volume loss. Since prior examination, the degree of volume loss has progressed. Right lung is clear. No pneumothorax. No pleural effusion on the right. Right internal jugular chest port tip seen at the superior cavoatrial junction. Cardiac size is at the upper limits of normal, stable. Pulmonary vascularity is normal. No acute bone abnormality. IMPRESSION: 1. Progressive left-sided volume loss. 2. 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 Electronically Signed   By: Dorethia Molt M.D.   On: 12/09/2023 19:41    Scheduled Meds:  bictegravir-emtricitabine -tenofovir  AF  1 tablet Oral Daily   Continuous  Infusions:  ampicillin -sulbactam (UNASYN ) IV Stopped (12/10/23 1028)   lactated ringers  75 mL/hr at 12/10/23 0510   vancomycin  1,250 mg (12/10/23 1030)     LOS: 1 day   Time spent: 60 minutes  Camellia Door, DO  [facility]  12/10/2023, 11:26 AM

## 2023-12-10 NOTE — Assessment & Plan Note (Signed)
 12-10-2023 plt ct 102K today. SCD for DVT prophylaxis.

## 2023-12-10 NOTE — Progress Notes (Signed)
 Plan for pt to be transferred to [facility] tonight.  Pt going to 6 bedtower, Room [phone].  Report given to [place], RN at this time.  All questions answered.  This RN called [facility], spoke to Luke [phone], no transfers available at this time, recommended to call [facility] transport line.  This RN called [facility] transport at [phone] (option #2) and was told that medical transports do not dispatch from 1900-0700.  Attempted to call our local [facility] back, but no answer.    Report given to next shift RN for follow up

## 2023-12-10 NOTE — H&P (Signed)
 History and Physical    Christopher Henson FMW:[id number] DOB: [birth year] DOA: 12/09/2023  Patient coming from: Home.  Chief Complaint: Left facial pain and swelling.  HPI: Christopher Henson is a 43 y.o. 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  Some difficulty swallowing.  Denies fever or chills or difficulty breathing.  Patient also has some mild chest discomfort around the lower part of the sternum for the last few months.  On deep inspiration.  ED Course: In the ER patient had CT maxillofacial done which shows concerning features for left facial cellulitis and also odontogenic abscess on the buccal side.  Patient was started on empiric antibiotics admitted for further management.  Labs show platelets of 117 hemoglobin 14.1 WBC 8.9 troponin 8.  Chest x-ray shows pleural effusion on the left side.  EKG shows normal sinus rhythm.  Patient admitted for further management.  Review of Systems: As per HPI, rest all negative.   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 Lynwood Nasuti   History of radiation therapy    Left lung- 09/11/23-09/18/23- Dr. 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   Protein calorie malnutrition (HCC)    Small cell lung cancer, 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   Thrombocytopenia (HCC) 06/2018    Past Surgical History:  Procedure Laterality Date   BIOPSY  12/21/2022   Procedure: BIOPSY;  Surgeon: Neda Jennet LABOR, MD;  Location: [facility];  Service: Endoscopy;;   BRONCHIAL WASHINGS  12/21/2022   Procedure: BRONCHIAL WASHINGS;   Surgeon: Neda Jennet LABOR, MD;  Location: [facility];  Service: Endoscopy;;   ENDOBRONCHIAL ULTRASOUND Left 12/21/2022   Procedure: ENDOBRONCHIAL ULTRASOUND;  Surgeon: Neda Jennet LABOR, MD;  Location: [facility];  Service: Endoscopy;  Laterality: Left;  Endobronchial ultrasound, biopsy, need fluoroscopy   FINE NEEDLE ASPIRATION  12/21/2022   Procedure: FINE NEEDLE ASPIRATION (FNA) LINEAR;  Surgeon: Neda Jennet LABOR, MD;  Location: [facility];  Service: Endoscopy;;   IR BONE MARROW BIOPSY & ASPIRATION  12/27/2022   IR IMAGING GUIDED PORT INSERTION  12/27/2022   IR LUMBAR PUNCTURE  12/22/2022   IR PERC PLEURAL DRAIN W/INDWELL CATH W/IMG GUIDE  01/15/2023   IR REMOVAL OF PLURAL CATH W/CUFF  03/27/2023   LUMBAR PUNCTURE  06/2018   during eval for meningitis    RADIOLOGY WITH ANESTHESIA N/A 11/01/2023   Procedure: MRI WITH ANESTHESIA;  Surgeon: Radiologist, Medication, MD;  Location: [facility];  Service: Radiology;  Laterality: N/A;   VIDEO BRONCHOSCOPY Left 12/21/2022   Procedure: VIDEO BRONCHOSCOPY WITH FLUORO, endobronchial ultrasound;  Surgeon: Neda Jennet LABOR, MD;  Location: [facility];  Service: Endoscopy;  Laterality: Left;  Schedule for 12/20/2022 12/21/2022     reports that he quit smoking about a year ago. His smoking use included cigarettes. He has never used smokeless tobacco. He reports current alcohol use. He reports current drug use. Frequency: 7.00 times per week. Drug: Marijuana.  No Known Allergies  Family History  Problem Relation Age of Onset   Diabetes Mother    Hypertension Mother    Diabetes Father  Hypertension Father    Healthy Sister    Healthy Brother    Healthy Sister    Healthy Sister    Healthy Sister    Healthy Brother    Healthy Brother    Heart disease Neg Hx    Cancer Neg Hx    Stroke Neg Hx     Prior to Admission medications   Medication Sig Start Date End Date Taking? Authorizing Provider  acetaminophen  (TYLENOL ) 325 MG tablet Take 2  tablets (650 mg total) by mouth every 6 (six) hours as needed. 01/17/23  Yes Raenelle Coria, MD  bictegravir-emtricitabine -tenofovir  AF (BIKTARVY ) 50-200-25 MG TABS tablet Take 1 tablet by mouth daily. 11/14/23  Yes Vu, Constance DASEN, MD  brimonidine  (ALPHAGAN ) 0.2 % ophthalmic solution Place 1 drop into the right eye 2 (two) times daily. 11/05/23  Yes   dexamethasone  (DECADRON ) 4 MG tablet Take 20 mg by mouth today then take 12 mg by mouth once daily thereafter 10/25/23  Yes Covington, Sarah M, PA-C  dorzolamide -timolol  (COSOPT ) 2-0.5 % ophthalmic solution Place 1 drop into the right eye 2 (two) times daily. 11/05/23  Yes   folic acid  (FOLVITE ) 1 MG tablet Take 2 tablets (2 mg total) by mouth daily. 10/04/23  Yes Timmy Maude SAUNDERS, MD  oxyCODONE  (OXYCONTIN ) 15 mg 12 hr tablet Take 1 tablet (15 mg total) by mouth every 8 (eight) hours. 12/07/23  Yes Timmy Maude SAUNDERS, MD  Oxycodone  HCl 10 MG TABS Take 1 tablet (10 mg total) by mouth every 4 (four) hours as needed. 12/07/23  Yes Timmy Maude SAUNDERS, MD  OXYGEN  Inhale 2 L into the lungs daily as needed.   Yes [provider]  prednisoLONE  acetate (PRED FORTE ) 1 % ophthalmic suspension Instill 1 (one) drop into RIGHT eye every hour while awake today, then every 2 (two) hours while awake tomorrow and thereafter (e.g. 6-7 times per day) 10/31/23  Yes   senna-docusate (SENOKOT-S) 8.6-50 MG tablet Take 1 tablet by mouth 2 (two) times daily between meals as needed for mild constipation. 01/03/23  Yes Samtani, Jai-Gurmukh, MD  LORazepam  (ATIVAN ) 1 MG tablet Take 1 tablet (1 mg total) by mouth every 8 (eight) hours. Take 1 hour prior to radiation treatment, do not drive after taking med Patient not taking: No sig reported 11/20/23   Shannon Agent, MD  megestrol  (MEGACE  ES) 625 MG/5ML suspension Take 5 mLs (625 mg total) by mouth daily. Patient not taking: Reported on 12/10/2023 11/14/23   Timmy Maude SAUNDERS, MD    Physical Exam: Constitutional: Moderately built and  nourished. Vitals:   12/09/23 1909 12/09/23 1916 12/09/23 2320 12/10/23 0104  BP: (!) 148/103  (!) 129/97 (!) 145/95  Pulse: 91  90 77  Resp: 18  10 16   Temp: 98.7 F (37.1 C)  98.8 F (37.1 C) 98.2 F (36.8 C)  TempSrc:   Oral Oral  SpO2: 98% 98% 100% 97%  Weight:  78 kg    Height:  6' (1.829 m)     Eyes: Anicteric no pallor. ENMT: No discharge from the ears eyes nose or mouth.  Side of the face on the lower part of the jaw is swollen.  Tender to touch. Neck: No mass felt.  No neck rigidity. Respiratory: No rhonchi or crepitations. Cardiovascular: S1-S2 heard. Abdomen: Soft nontender bowel sound present. Musculoskeletal: No edema. Skin: No rash. Neurologic: Alert awake oriented to time place and person.  Moves all extremities. Psychiatric: Appears normal.  Normal affect.   Labs on Admission:  I have personally reviewed following labs and imaging studies  CBC: Recent Labs  Lab 12/07/23 1225 12/09/23 1918  WBC 12.2* 8.9  NEUTROABS 11.0*  --   HGB 12.7* 14.1  HCT 37.2* 42.5  MCV 98.7 101.9*  PLT 127* 117*   Basic Metabolic Panel: Recent Labs  Lab 12/07/23 1225 12/09/23 1918  NA 140 141  K 4.6 4.0  CL 104 105  CO2 24 23  GLUCOSE 132* 96  BUN 28* 18  CREATININE 0.98 1.00  CALCIUM  9.7 8.7*   GFR: Estimated Creatinine Clearance: 102.4 mL/min (by C-G formula based on SCr of 1 mg/dL). Liver Function Tests: Recent Labs  Lab 12/07/23 1225  AST 39  ALT 42  ALKPHOS 97  BILITOT 0.3  PROT 6.5  ALBUMIN  4.1   No results for input(s): LIPASE, AMYLASE in the last 168 hours. No results for input(s): AMMONIA in the last 168 hours. Coagulation Profile: No results for input(s): INR, PROTIME in the last 168 hours. Cardiac Enzymes: No results for input(s): CKTOTAL, CKMB, CKMBINDEX, TROPONINI in the last 168 hours. BNP (last 3 results) No results for input(s): PROBNP in the last 8760 hours. HbA1C: No results for input(s): HGBA1C in the last 72  hours. CBG: No results for input(s): GLUCAP in the last 168 hours. Lipid Profile: No results for input(s): CHOL, HDL, LDLCALC, TRIG, CHOLHDL, LDLDIRECT in the last 72 hours. Thyroid  Function Tests: No results for input(s): TSH, T4TOTAL, FREET4, T3FREE, THYROIDAB in the last 72 hours. Anemia Panel: No results for input(s): VITAMINB12, FOLATE, FERRITIN, TIBC, IRON, RETICCTPCT in the last 72 hours. Urine analysis:    Component Value Date/Time   COLORURINE YELLOW 11/02/2023 1230   APPEARANCEUR CLEAR 11/02/2023 1230   LABSPEC 1.013 11/02/2023 1230   PHURINE 5.0 11/02/2023 1230   GLUCOSEU NEGATIVE 11/02/2023 1230   HGBUR NEGATIVE 11/02/2023 1230   BILIRUBINUR NEGATIVE 11/02/2023 1230   KETONESUR NEGATIVE 11/02/2023 1230   PROTEINUR NEGATIVE 11/02/2023 1230   NITRITE NEGATIVE 11/02/2023 1230   LEUKOCYTESUR NEGATIVE 11/02/2023 1230   Sepsis Labs: @LABRCNTIP (procalcitonin:4,lacticidven:4) )No results found for this or any previous visit (from the past 240 hours).   Radiological Exams on Admission: CT Maxillofacial W Contrast Result Date: 12/09/2023 CLINICAL DATA:  Initial evaluation for acute left facial swelling. EXAM: CT MAXILLOFACIAL WITH CONTRAST TECHNIQUE: Multidetector CT imaging of the maxillofacial structures was performed with intravenous contrast. Multiplanar CT image reconstructions were also generated. RADIATION DOSE REDUCTION: This exam was performed according to the departmental dose-optimization program which includes automated exposure control, adjustment of the mA and/or kV according to patient size and/or use of iterative reconstruction technique. CONTRAST:  75mL OMNIPAQUE  IOHEXOL  350 MG/ML SOLN COMPARISON:  None Available. FINDINGS: Osseous: No acute osseous abnormality. No discrete or worrisome osseous lesions. Orbits: Globes orbital soft tissues within normal limits. Sinuses: Mild chronic mucoperiosteal thickening present about the  ethmoidal air cells and maxillary sinuses. Paranasal sinuses are otherwise largely clear. Mastoid air cells and middle ear cavities are well pneumatized and free of fluid. Soft tissues: Soft tissue swelling with inflammatory stranding seen involving the soft tissues of the left lower face, concerning for acute infection/cellulitis. Poor dentition with innumerable dental caries, suggesting an odontogenic origin. Superimposed rim enhancing hypodense collection along the buccal aspect of the left mandibular body measures 3.6 x 1.8 x 3.6 cm, consistent with odontogenic abscess (series 3, image 22). No extension of infection into the deeper spaces of the neck at this time. Limited intracranial: No acute finding. IMPRESSION: 1. Soft tissue swelling  with inflammatory stranding involving the soft tissues of the left lower face, concerning for acute infection/cellulitis. Superimposed 3.6 x 1.8 x 3.6 cm odontogenic abscess along the buccal aspect of the left mandibular body as above. 2. Poor dentition with innumerable dental caries. Electronically Signed   By: Morene Hoard M.D.   On: 12/09/2023 21:51   DG Chest 2 View Result Date: 12/09/2023 CLINICAL DATA:  Chest pain EXAM: CHEST - 2 VIEW COMPARISON:  12/07/2023, CT 11/24/2023 FINDINGS: Multiple nodular pleural base masses and associated small pleural effusion are again seen within the left hemithorax with associated marked asymmetric left-sided volume loss. Since prior examination, the degree of volume loss has progressed. Right lung is clear. No pneumothorax. No pleural effusion on the right. Right internal jugular chest port tip seen at the superior cavoatrial junction. Cardiac size is at the upper limits of normal, stable. Pulmonary vascularity is normal. No acute bone abnormality. IMPRESSION: 1. Progressive left-sided volume loss. 2. 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 Electronically Signed   By: Dorethia Molt M.D.   On: 12/09/2023 19:41    EKG: Independently reviewed.  Normal sinus rhythm.  Assessment/Plan Principal Problem:   Tooth abscess Active Problems:   HIV (human immunodeficiency virus infection) (HCC)   Thrombocytopenia (HCC)   Large cell neuroendocrine carcinoma (HCC)    Left facial cellulitis with odontogenic abscess empirically started antibiotics.  Follow cultures.  Will consult oral surgeon Dr. Celena in the morning. HIV last CD4 count was 192 months ago.  On Biktarvy . Large cell neuroendocrine carcinoma of the lung being followed by oncologist. Thrombocytopenia likely could be related to chemotherapy.  Follow CBC. Chest discomfort mostly in the lower part of the sternum on deep inspiration will get CT angiogram of the chest.  Troponins and EKG were unremarkable.  Since patient has left facial cellulitis with odontogenic abscess will need close monitoring further workup and more than 2 midnight stay.   DVT prophylaxis: SCDs. Code Status: Full code. Family Communication: Discussed with patient. Disposition Plan: Medical floor. Consults called: Transport planner. Admission status: Inpatient.

## 2023-12-10 NOTE — Assessment & Plan Note (Signed)
 12-10-2023 ID consulted for HIV med management.

## 2023-12-10 NOTE — Plan of Care (Signed)

## 2023-12-10 NOTE — Progress Notes (Addendum)
   Spoke with Dr. Tiburcio with hospitalist service at [facility] who is taking triage calls. Discussed Mr. Trentman case. Pt has been accepted in-transfer. Appreciate Dr. Darlin graciousness in accepting my patient for oral surgery management.  [facility] are more than willing to accept patient back after pt has had his abscess drained.  [facility] has declined transfer.  Camellia Door, DO [facility]

## 2023-12-10 NOTE — Plan of Care (Signed)
  Problem: Education: Goal: Knowledge of General Education information will improve Description: Including pain rating scale, medication(s)/side effects and non-pharmacologic comfort measures Outcome: Progressing   Problem: Health Behavior/Discharge Planning: Goal: Ability to manage health-related needs will improve Outcome: Progressing   Problem: Pain Managment: Goal: General experience of comfort will improve and/or be controlled Outcome: Progressing   Problem: Safety: Goal: Ability to remain free from injury will improve Outcome: Progressing   Problem: Skin Integrity: Goal: Risk for impaired skin integrity will decrease Outcome: Progressing   Problem: Coping: Goal: Level of anxiety will decrease Outcome: Progressing

## 2023-12-10 NOTE — Assessment & Plan Note (Addendum)
 12-10-2023 CT maxillofacial shows Superimposed rim enhancing hypodense collection along the buccal aspect of the left mandibular body measures 3.6 x 1.8 x 3.6 cm, consistent with odontogenic abscess (series 3, image 22). Given his HIV status, lung cancer undergoing active chemo treatment, I believe he needs urgent evaluation and management of his dental abscess while he is admitted to the hospital. Hopefully he can get transferred to tertiary care center that has oral surgery available.  Verified that there is no on-call oral surgeon or dentist available for the entire week. Discussed with CMO Dr. Kimble. Will initiate transfer to tertiary care center where oral surgery is available. Have asked ID to see patient to manage his HIV meds and IV ABX. His oncologist Dr. Timmy placed on pt's care team list to notify oncology of his admission to hospital. Pt made aware that he needs transfer to tertiary care center. Discussed with [facility]. They do not have oral surgery available. Have reached out to [facility] and [facility] center transfer line. Continue with IV unasyn /Vanco for now. ID will see him and adjust his ABX accordingly.

## 2023-12-10 NOTE — Discharge Summary (Addendum)
 [facility] Physician Discharge Summary   Patient name: Christopher Henson  Admit date:     12/09/2023  Discharge date: 12/10/2023  Attending Physician: FRANKY REDIA SAILOR [[id number]]  Discharge Physician: Camellia Door   PCP: Bulah Alm RAMAN, PA-C  Admitted From: Home  Disposition:  Transfer to [facility]. Dr. Tiburcio is the accepting physician  Recommendations for Outpatient Follow-up:  Follow up with PCP in 1-2 weeks  Transfer Condition:Stable CODE STATUS:FULL Diet recommendation: Full liquid diet Fluid Restriction: None  Hospital Summary: CC: left facial pain and swelling HPI: Keneth Henson is a 43 y.o. 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  Some difficulty swallowing.  Denies fever or chills or difficulty breathing.  Patient also has some mild chest discomfort around the lower part of the sternum for the last few months.  On deep inspiration.   ED Course: In the ER patient had CT maxillofacial done which shows concerning features for left facial cellulitis and also odontogenic abscess on the buccal side.  Patient was started on empiric antibiotics admitted for further management.  Labs show platelets of 117 hemoglobin 14.1 WBC 8.9 troponin 8.  Chest x-ray shows pleural effusion on the left side.  EKG shows normal sinus rhythm.  Patient admitted for further management.  Significant Events: Admitted 12/09/2023 left facial cellulitis with odontogenic abscess 12-10-2023 verified that [facility] does not have oral surgery or dental coverage at all for month of September.  ID consulted. Spoke with Dr. Lampman with [facility] who has accepted patient in transfer. [facility] does not have oral surgery service. [facility] declined transfer. IV ABX narrowed to IV Unasyn  only. IV Vanco stopped. Verified with  radiology department that pt's images since April 2025 have been Power-Shared with [facility].  Admission Labs: WBC 8.9, HgB 14.1, plt 117 Na 141, K 4.0, CO2 of 23, BUN 18, Scr 1.0, glu 96 Lactic acid 1.6  Admission Imaging Studies: CXR Progressive left-sided volume loss. 2. Multiple nodular pleural base masses and associated small pleural effusion within the left hemithorax, likely reflecting pleural metastatic disease CT face Soft tissue swelling with inflammatory stranding involving the soft tissues of the left lower face, concerning for acute infection/cellulitis. Superimposed 3.6 x 1.8 x 3.6 cm odontogenic abscess along the buccal aspect of the left mandibular body as above. 2. Poor dentition with innumerable dental caries CTPA No CT evidence for acute pulmonary embolus.2. Similar appearance of loculated pleural fluid in the left hemithorax with nodular character in some regions suggesting underlying pleural nodularity. 3. Mediastinal, left axillary, and upper abdominal lymphadenopathy. 4. Interval progression of hepatic metastatic disease. 5. Large left adrenal metastasis. 6. Similar appearance of sclerotic lesions in the sternum with sclerotic lesions in the upper lumbar spine consistent with metastatic disease but not included in the field of view for the previous chest CT. 7. Scattered tiny pulmonary nodules bilaterally. Nodules in the right apex appear progressive in the interval.  Significant Labs:   Significant Imaging Studies:   Antibiotic Therapy: Anti-infectives (From admission, onward)    Start     Dose/Rate Route Frequency Ordered Stop   12/10/23 1000  bictegravir-emtricitabine -tenofovir  AF (BIKTARVY ) 50-200-25 MG per tablet 1 tablet        1 tablet Oral Daily 12/10/23 0204     12/10/23 0800  vancomycin  (VANCOREADY) IVPB 1250 mg/250 mL  1,250 mg 166.7 mL/hr over 90 Minutes Intravenous Every 12 hours 12/10/23 0619     12/10/23 0215  Ampicillin -Sulbactam (UNASYN ) 3 g in  sodium chloride  0.9 % 100 mL IVPB        3 g 200 mL/hr over 30 Minutes Intravenous Every 6 hours 12/10/23 0209     12/09/23 2030  vancomycin  (VANCOCIN ) IVPB 1000 mg/200 mL premix        1,000 mg 200 mL/hr over 60 Minutes Intravenous  Once 12/09/23 2025 12/09/23 2256   12/09/23 2030  Ampicillin -Sulbactam (UNASYN ) 3 g in sodium chloride  0.9 % 100 mL IVPB        3 g 200 mL/hr over 30 Minutes Intravenous  Once 12/09/23 2027 12/09/23 2133       Procedures:   Consultants:    Hospital Course by Problem: * Mandibular abscess 12-10-2023 CT maxillofacial shows Superimposed rim enhancing hypodense collection along the buccal aspect of the left mandibular body measures 3.6 x 1.8 x 3.6 cm, consistent with odontogenic abscess (series 3, image 22). Given his HIV status, lung cancer undergoing active chemo treatment, I believe he needs urgent evaluation and management of his dental abscess while he is admitted to the hospital. Hopefully he can get transferred to tertiary care center that has oral surgery available.  Verified that there is no on-call oral surgeon or dentist available for the entire week. Discussed with CMO Dr. Kimble. Will initiate transfer to tertiary care center where oral surgery is available. Have asked ID to see patient to manage his HIV meds and IV ABX. His oncologist Dr. Timmy placed on pt's care team list to notify oncology of his admission to hospital. Pt made aware that he needs transfer to tertiary care center. Discussed with [facility]. They do not have oral surgery available. Have reached out to [facility] and [facility] center transfer line. Continue with IV unasyn /Vanco for now. ID will see him and adjust his ABX accordingly.  Facial cellulitis 12-10-2023 CT maxillofacial shows Superimposed rim enhancing hypodense collection along the buccal aspect of the left mandibular body measures 3.6 x 1.8 x 3.6 cm, consistent with odontogenic abscess (series 3, image 22).  Given his HIV status, lung cancer undergoing active chemo treatment, I believe he needs urgent evaluation and management of his dental abscess while he is admitted to the hospital. Hopefully he can get transferred to tertiary care center that has oral surgery available.  Verified that there is no on-call oral surgeon or dentist available for the entire week. Discussed with CMO Dr. Kimble. Will initiate transfer to tertiary care center where oral surgery is available. Have asked ID to see patient to manage his HIV meds and IV ABX. His oncologist Dr. Timmy placed on pt's care team list to notify oncology of his admission to hospital. Pt made aware that he needs transfer to tertiary care center. Discussed with [facility]. They do not have oral surgery available. Have reached out to [facility] and [facility] center transfer line. Continue with IV unasyn /Vanco for now. ID will see him and adjust his ABX accordingly.  HIV (human immunodeficiency virus infection) (HCC) 12-10-2023 ID consulted for HIV med management.  Primary malignant neoplasm of lung with metastasis to brain [facility]) 12-10-2023 he has stage 4 lung cancer with brain mets. Followed by oncology. Still getting active chemo. Oncology thinking about XRT to his brain at some point.  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 Followed by oncology. Still  getting active chemo. Oncology thinking about XRT to his brain at some point.  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 Followed by oncology. Still getting active chemo. Oncology thinking about XRT to his brain at some point. CTPA shows stable right sided loculated effusion. He had a malignant left sided effusion in 12-2022 that was drained by thoracentesis.  Mediastinal mass 12-10-2023 he has stage 4 lung cancer with brain mets. Followed by oncology. Still getting active chemo. Oncology thinking about  XRT to his brain at some point.  Thrombocytopenia (HCC) 12-10-2023 plt ct 102K today. SCD for DVT prophylaxis.    Discharge Diagnoses:  Principal Problem:   Mandibular abscess Active Problems:   Facial cellulitis   HIV (human immunodeficiency virus infection) (HCC)   Thrombocytopenia (HCC)   Mediastinal mass   Malignant pleural effusion   Large cell neuroendocrine carcinoma (HCC)   Primary malignant neoplasm of lung with metastasis to brain [facility])   Discharge Instructions  Discharge Instructions     Diet full liquid   Complete by: As directed    Discharge instructions   Complete by: As directed    1. Follow up with your primary care provider in 1-2 weeks following discharge from hospital.   Increase activity slowly   Complete by: As directed       Allergies as of 12/10/2023   No Known Allergies      Medication List     STOP taking these medications    dorzolamide -timolol  2-0.5 % ophthalmic solution Commonly known as: COSOPT        TAKE these medications    acetaminophen  325 MG tablet Commonly known as: TYLENOL  Take 2 tablets (650 mg total) by mouth every 6 (six) hours as needed.   Ampicillin -Sulbactam 3 g in sodium chloride  0.9 % 100 mL Inject 3 g into the vein every 6 (six) hours.   bictegravir-emtricitabine -tenofovir  AF 50-200-25 MG Tabs tablet Commonly known as: BIKTARVY  Take 1 tablet by mouth daily.   brimonidine  0.2 % ophthalmic solution Commonly known as: ALPHAGAN  Place 1 drop into the right eye 2 (two) times daily.   dexamethasone  4 MG tablet Commonly known as: DECADRON  Take 3 tablets (12 mg total) by mouth daily. What changed:  how much to take how to take this when to take this additional instructions   folic acid  1 MG tablet Commonly known as: FOLVITE  Take 2 tablets (2 mg total) by mouth daily.   HYDROmorphone  1 MG/ML injection Commonly known as: DILAUDID  Inject 0.5 mLs (0.5 mg total) into the vein every 3 (three) hours as needed  (breakthrough pain not relieved by po oxycodone .).   LORazepam  1 MG tablet Commonly known as: ATIVAN  Take 1 tablet (1 mg total) by mouth every 8 (eight) hours. Take 1 hour prior to radiation treatment, do not drive after taking med   megestrol  625 MG/5ML suspension Commonly known as: MEGACE  ES Take 5 mLs (625 mg total) by mouth daily.   oxyCODONE  15 mg 12 hr tablet Commonly known as: OxyCONTIN  Take 1 tablet (15 mg total) by mouth every 8 (eight) hours.   Oxycodone  HCl 10 MG Tabs Take 1 tablet (10 mg total) by mouth every 4 (four) hours as needed.   OXYGEN  Inhale 2 L into the lungs daily as needed.   prednisoLONE  acetate 1 % ophthalmic suspension Commonly known as: PRED FORTE  Instill 1 (one) drop into RIGHT eye every hour while awake today, then every 2 (two) hours while awake tomorrow and thereafter (  e.g. 6-7 times per day)   senna-docusate 8.6-50 MG tablet Commonly known as: Senokot-S Take 1 tablet by mouth 2 (two) times daily between meals as needed for mild constipation.        No Known Allergies  Discharge Exam: Vitals:   12/10/23 0429 12/10/23 0843  BP: (!) 140/96 (!) 128/91  Pulse: 89 87  Resp: 13 18  Temp: 98.5 F (36.9 C) 99.3 F (37.4 C)  SpO2: 100% 100%    Physical Exam Vitals and nursing note reviewed.  Constitutional:      Comments: Chronically ill appearing  HENT:     Head: Normocephalic and atraumatic.     Mouth/Throat:     Comments: Able to open his mouth only 1.5 fingers width wide.  Tender and fluctuant fluid collection on left lower mandibular/cheek area.  Cardiovascular:     Rate and Rhythm: Normal rate and regular rhythm.  Pulmonary:     Effort: Pulmonary effort is normal.     Breath sounds: Normal breath sounds.  Abdominal:     General: Bowel sounds are normal.     Palpations: Abdomen is soft.  Skin:    General: Skin is warm and dry.     Capillary Refill: Capillary refill takes less than 2 seconds.  Neurological:     Mental  Status: He is alert and oriented to person, place, and time.     The results of significant diagnostics from this hospitalization (including imaging, microbiology, ancillary and laboratory) are listed below for reference.    Microbiology: Recent Results (from the past 240 hours)  Culture, blood (routine x 2)     Status: None (Preliminary result)   Collection Time: 12/09/23  8:30 PM   Specimen: BLOOD RIGHT ARM  Result Value Ref Range Status   Specimen Description BLOOD RIGHT ARM  Final   Special Requests   Final    BOTTLES DRAWN AEROBIC AND ANAEROBIC Blood Culture adequate volume   Culture   Final    NO GROWTH < 12 HOURS Performed at [facility], [address] [place], [place] [address]    Report Status PENDING  Incomplete  Culture, blood (routine x 2)     Status: None (Preliminary result)   Collection Time: 12/09/23  8:40 PM   Specimen: BLOOD  Result Value Ref Range Status   Specimen Description BLOOD LEFT ANTECUBITAL  Final   Special Requests   Final    BOTTLES DRAWN AEROBIC AND ANAEROBIC Blood Culture results may not be optimal due to an inadequate volume of blood received in culture bottles   Culture   Final    NO GROWTH < 12 HOURS Performed at [facility], [address] [place], [place] [address]    Report Status PENDING  Incomplete     Labs: BNP (last 3 results) Recent Labs    11/02/23 0955  BNP 42.5   Basic Metabolic Panel: Recent Labs  Lab 12/07/23 1225 12/09/23 1918 12/10/23 0610  NA 140 141 142  K 4.6 4.0 4.1  CL 104 105 104  CO2 24 23 26   GLUCOSE 132* 96 86  BUN 28* 18 12  CREATININE 0.98 1.00 0.99  CALCIUM  9.7 8.7* 8.4*   Liver Function Tests: Recent Labs  Lab 12/07/23 1225  AST 39  ALT 42  ALKPHOS 97  BILITOT 0.3  PROT 6.5  ALBUMIN  4.1   CBC: Recent Labs  Lab 12/07/23 1225 12/09/23 1918 12/10/23 0610  WBC 12.2* 8.9 9.2  NEUTROABS  11.0*  --   --   HGB 12.7* 14.1 12.9*  HCT 37.2* 42.5 37.6*  MCV 98.7 101.9* 99.5   PLT 127* 117* 102*   Sepsis Labs Recent Labs  Lab 12/07/23 1225 12/09/23 1918 12/10/23 0610  WBC 12.2* 8.9 9.2    Procedures/Studies: CT Angio Chest Pulmonary Embolism (PE) W or WO Contrast Result Date: 12/10/2023 CLINICAL DATA:  Severe chest pain. History of lung cancer. * Tracking Code: BO * EXAM: CT ANGIOGRAPHY CHEST WITH CONTRAST TECHNIQUE: Multidetector CT imaging of the chest was performed using the standard protocol during bolus administration of intravenous contrast. Multiplanar CT image reconstructions and MIPs were obtained to evaluate the vascular anatomy. RADIATION DOSE REDUCTION: This exam was performed according to the departmental dose-optimization program which includes automated exposure control, adjustment of the mA and/or kV according to patient size and/or use of iterative reconstruction technique. CONTRAST:  75mL OMNIPAQUE  IOHEXOL  350 MG/ML SOLN COMPARISON:  11/24/2023 FINDINGS: Cardiovascular: The heart size is normal. No substantial pericardial effusion. Trace pericardial effusion evident. Coronary artery calcification is evident. No thoracic aortic aneurysm. No substantial atherosclerotic calcification of the thoracic aorta. There is no filling defect within the opacified pulmonary arteries to suggest the presence of an acute pulmonary embolus. Mediastinum/Nodes: Pre-vascular mass measured previously at 5.2 x 3.8 cm is 5.2 x 4.1 cm today on 58/5. Lesion posterior to the aortic arch measured previously at 1.8 x 1.4 is 1.9 x 1.9 cm today on 54/5. Subcarinal lymph node measured previously at 1.9 cm short axis is 2.4 cm short axis today on 89/5. Right hilar lymphadenopathy again noted. Similar left axillary lymphadenopathy given differential arm positioning. Loculated pleural fluid in the right hemithorax is similar. Lungs/Pleura: Similar left apical scarring. Collapse/consolidation in the left lung base is similar. Scattered tiny pulmonary nodules bilaterally. Nodules in the right  apex appear progressive in the interval (see 6 mm right paraspinal nodule on 31/7. As above, there is loculated pleural fluid in the left hemithorax with nodular character in some regions suggesting underlying pleural nodularity. Upper Abdomen: Multiple liver metastases again noted. 3 cm lesion posterior left hepatic lobe on 129/5 was 2.4 cm previously (remeasured). 3.7 cm lesion lateral right hepatic dome was 2.2 cm previously remeasured. Large left adrenal metastasis evident. Upper abdominal lymphadenopathy is similar. Musculoskeletal: Sclerotic lesions are seen in the sternum and upper lumbar spine consistent with metastatic disease. Review of the MIP images confirms the above findings. IMPRESSION: 1. No CT evidence for acute pulmonary embolus. 2. Similar appearance of loculated pleural fluid in the left hemithorax with nodular character in some regions suggesting underlying pleural nodularity. 3. Mediastinal, left axillary, and upper abdominal lymphadenopathy. 4. Interval progression of hepatic metastatic disease. 5. Large left adrenal metastasis. 6. Similar appearance of sclerotic lesions in the sternum with sclerotic lesions in the upper lumbar spine consistent with metastatic disease but not included in the field of view for the previous chest CT. 7. Scattered tiny pulmonary nodules bilaterally. Nodules in the right apex appear progressive in the interval. Electronically Signed   By: Camellia Candle M.D.   On: 12/10/2023 05:10   CT Maxillofacial W Contrast Result Date: 12/09/2023 CLINICAL DATA:  Initial evaluation for acute left facial swelling. EXAM: CT MAXILLOFACIAL WITH CONTRAST TECHNIQUE: Multidetector CT imaging of the maxillofacial structures was performed with intravenous contrast. Multiplanar CT image reconstructions were also generated. RADIATION DOSE REDUCTION: This exam was performed according to the departmental dose-optimization program which includes automated exposure control, adjustment of  the mA and/or kV  according to patient size and/or use of iterative reconstruction technique. CONTRAST:  75mL OMNIPAQUE  IOHEXOL  350 MG/ML SOLN COMPARISON:  None Available. FINDINGS: Osseous: No acute osseous abnormality. No discrete or worrisome osseous lesions. Orbits: Globes orbital soft tissues within normal limits. Sinuses: Mild chronic mucoperiosteal thickening present about the ethmoidal air cells and maxillary sinuses. Paranasal sinuses are otherwise largely clear. Mastoid air cells and middle ear cavities are well pneumatized and free of fluid. Soft tissues: Soft tissue swelling with inflammatory stranding seen involving the soft tissues of the left lower face, concerning for acute infection/cellulitis. Poor dentition with innumerable dental caries, suggesting an odontogenic origin. Superimposed rim enhancing hypodense collection along the buccal aspect of the left mandibular body measures 3.6 x 1.8 x 3.6 cm, consistent with odontogenic abscess (series 3, image 22). No extension of infection into the deeper spaces of the neck at this time. Limited intracranial: No acute finding. IMPRESSION: 1. Soft tissue swelling with inflammatory stranding involving the soft tissues of the left lower face, concerning for acute infection/cellulitis. Superimposed 3.6 x 1.8 x 3.6 cm odontogenic abscess along the buccal aspect of the left mandibular body as above. 2. Poor dentition with innumerable dental caries. Electronically Signed   By: Morene Hoard M.D.   On: 12/09/2023 21:51   DG Chest 2 View Result Date: 12/09/2023 CLINICAL DATA:  Chest pain EXAM: CHEST - 2 VIEW COMPARISON:  12/07/2023, CT 11/24/2023 FINDINGS: Multiple nodular pleural base masses and associated small pleural effusion are again seen within the left hemithorax with associated marked asymmetric left-sided volume loss. Since prior examination, the degree of volume loss has progressed. Right lung is clear. No pneumothorax. No pleural effusion on  the right. Right internal jugular chest port tip seen at the superior cavoatrial junction. Cardiac size is at the upper limits of normal, stable. Pulmonary vascularity is normal. No acute bone abnormality. IMPRESSION: 1. Progressive left-sided volume loss. 2. 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 Electronically Signed   By: Dorethia Molt M.D.   On: 12/09/2023 19:41   DG Chest 2 View Result Date: 12/07/2023 CLINICAL DATA:  43 year old male with left side chest pain. Pleural effusion. Non-small cell lung cancer. EXAM: CHEST - 2 VIEW COMPARISON:  CTA chest 11/24/2023 and earlier. FINDINGS: PA and lateral views of the chest 0915 hours. Stable right chest power port. Stable abnormal mediastinal contour reflecting mediastinal lymphadenopathy, and superimposed abnormal pleural spaces and lung base on the left, not significantly changed. No pneumothorax. No pulmonary edema. No areas of worsening ventilation. Stable visualized osseous structures.  Negative visible bowel gas. IMPRESSION: Stable radiographic appearance of lung cancer since 11/24/2023. No new cardiopulmonary abnormality identified. Electronically Signed   By: VEAR Hurst M.D.   On: 12/07/2023 09:35   CT ABDOMEN PELVIS W CONTRAST Result Date: 11/24/2023 CLINICAL DATA:  Left side abdominal pain EXAM: CT ABDOMEN AND PELVIS WITH CONTRAST TECHNIQUE: Multidetector CT imaging of the abdomen and pelvis was performed using the standard protocol following bolus administration of intravenous contrast. RADIATION DOSE REDUCTION: This exam was performed according to the departmental dose-optimization program which includes automated exposure control, adjustment of the mA and/or kV according to patient size and/or use of iterative reconstruction technique. CONTRAST:  75mL OMNIPAQUE  IOHEXOL  350 MG/ML SOLN COMPARISON:  08/02/2023 FINDINGS: Lower chest: See chest CT  report today. Hepatobiliary: Numerous new hepatic masses compatible with metastases. The largest is in left hepatic lobe measuring 5.8 x 4.6 cm. Numerous  other new metastases noted throughout the liver. Gallbladder unremarkable. Pancreas: No focal abnormality or ductal dilatation. Spleen: No focal abnormality.  Normal size. Adrenals/Urinary Tract: Large left adrenal mass measures up to 4.2 cm compared to 1.3 cm previously. No renal mass or hydronephrosis. Urinary bladder unremarkable. Stomach/Bowel: Normal appendix. Stomach, large and small bowel grossly unremarkable. Vascular/Lymphatic: Upper abdominal adenopathy in the celiac region. Retroperitoneal adenopathy, both new since prior study. No evidence of aortic aneurysm. Reproductive: No visible focal abnormality. Other: No free fluid or free air. Musculoskeletal: Sclerotic lesions in the L1 and L2 vertebral body are stable. No acute bony abnormality. IMPRESSION: Several new hepatic metastases measuring up to 5.8 cm in the left hepatic lobe. Enlarging left adrenal metastasis. New celiac axis and retroperitoneal adenopathy. Electronically Signed   By: Franky Crease M.D.   On: 11/24/2023 20:34   CT Angio Chest PE W and/or Wo Contrast Result Date: 11/24/2023 CLINICAL DATA:  Pleuritic chest pain.  Non-small cell lung cancer. EXAM: CT ANGIOGRAPHY CHEST WITH CONTRAST TECHNIQUE: Multidetector CT imaging of the chest was performed using the standard protocol during bolus administration of intravenous contrast. Multiplanar CT image reconstructions and MIPs were obtained to evaluate the vascular anatomy. RADIATION DOSE REDUCTION: This exam was performed according to the departmental dose-optimization program which includes automated exposure control, adjustment of the mA and/or kV according to patient size and/or use of iterative reconstruction technique. CONTRAST:  75mL OMNIPAQUE  IOHEXOL  350 MG/ML SOLN COMPARISON:  08/02/2023 FINDINGS: Cardiovascular: Heart is normal  size. Aorta is normal caliber. No filling defects in the pulmonary arteries to suggest pulmonary emboli. Mediastinum/Nodes: Large prevascular nodal mass measures 5.2 x 3.8 cm, similar to prior study. Abnormal soft tissue posterior to the proximal descending thoracic aorta measures 1.8 x 1.4 cm, new since prior study. Soft tissue nodule adjacent to the distal descending thoracic aorta on image 125 of series 3 measures 2.8 x 2.4 cm and could be mediastinal or pleural based. Right hilar lymph node has a short axis diameter of 1.1 cm, stable. Subcarinal lymph node has a short axis diameter of 1.9 cm, new since prior study. Left axillary adenopathy measures 1.8 cm in short axis diameter, new since prior study. Trachea and thyroid  unremarkable. Lungs/Pleura: Small loculated left pleural effusion. Pleural thickening, loculated effusion, or pleural mass anteriorly at the left lung base, increasing since prior study. Pleural nodularity noted at the left lung base, worsening since prior study. No confluent opacities, effusions or nodules on the right. Upper Abdomen: See abdominal CT report today. Musculoskeletal: Sclerotic area within the mid sternum is similar to prior study. Review of the MIP images confirms the above findings. IMPRESSION: No evidence of pulmonary embolus. Large left pre-vascular mass is similar to prior study. However, there is worsening subcarinal adenopathy and left axillary adenopathy. In addition, there appears to be worsening pleural disease in the left lung with increasing lower nodularity at the left lung base and soft tissue nodules posterior to the proximal descending thoracic aorta and distal descending thoracic aorta which could be mediastinal adenopathy or pleural nodules. Electronically Signed   By: Franky Crease M.D.   On: 11/24/2023 20:30   DG Chest 2 View Result Date: 11/24/2023 CLINICAL DATA:  Chest pain Non-small cell lung cancer (NSCLC), metastatic, EXAM: CHEST - 2 VIEW COMPARISON:   Chest x-ray 11/02/2023,  CT chest 08/02/2023 FINDINGS: Right chest wall Port-A-Cath with tip overlying the expected region of the superior cavoatrial junction. The heart and mediastinal contours are unchanged. Redemonstration of left upper lobe  perihilar airspace opacity that is better evaluated on CT chest 11/02/2023. No focal consolidation. No pulmonary edema. Interval increase in size of a small to moderate volume loculated left pleural effusion. No right pleural effusion. No pneumothorax. No acute osseous abnormality. IMPRESSION: Interval increase in size of a small to moderate volume loculated left pleural effusion. Electronically Signed   By: Morgane  Naveau M.D.   On: 11/24/2023 15:09    Time coordinating discharge: 60 mins  SIGNED:  Camellia Door, DO [facility] 12/10/23, 1:39 PM

## 2023-12-10 NOTE — Assessment & Plan Note (Addendum)
 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 Followed by oncology. Still getting active chemo. Oncology thinking about XRT to his brain at some point. CTPA shows stable right sided loculated effusion. He had a malignant left sided effusion in 12-2022 that was drained by thoracentesis.

## 2023-12-10 NOTE — Consult Note (Signed)
 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  He follows with Dr. Overton and was doing well in terms of viral suppression a CD4 was slightly depressed but I expect  that was due to his chemo and radiation.  In any case he now presented to the hospital with left facial swelling and they were gone for a week.  CT maxillofacial was done which showed significant soft tissue swelling and inflammatory findings as well as a 3.6 x 1.8 x 3.6 dental abscess along the buccal aspect of the left mandibular body  He was admitted to the hospitalist service and has been placed on vancomycin  and Unasyn .  There was an initial understanding that there was a oral surgeon on-call but apparently that surgeon failed to return phone calls and may indeed not have been on-call but he was listed as being on-call on MI on.  The patient clearly needs to have a surgical intervention and should be transferred to another hospital that has oral surgery available is my understanding that that is now happening in terms of his antibiotics he does not need vancomycin  and Unasyn  would be an ideal antibiotic for this type of infection I will discontinue his vancomycin  I will check a sed rate and CRP Given this is in the mandible it is osteomyelitis.  Certainly if he has surgical intervention would be helpful to send cultures.  I will check an HIV RNA and CD4 count and he will continue on Biktarvy . If CD4 is significantly lower than 200 could place on bactrim  prophylaxis  I have personally spent 80 minutes involved in face-to-face and non-face-to-face activities for this patient on the day of the visit. Professional time spent includes the following activities: Preparing to see the patient (review of  tests), Obtaining and/or reviewing separately obtained history (admission/discharge record), Performing a medically appropriate examination and/or evaluation , Ordering medications/tests/procedures, referring and communicating with other health care professionals, Documenting clinical information in the EMR, Independently interpreting results (not separately reported), Communicating results to the  patient/family/caregiver, Counseling and educating the patient/family/caregiver and Care coordination (not separately reported).   Evaluation of the patient requires complex antimicrobial therapy evaluation, counseling , isolation needs to reduce disease transmission and risk assessment and mitigation.     Review of Systems: Review of Systems  Constitutional:  Positive for malaise/fatigue. Negative for chills, fever and weight loss.  HENT:  Negative for congestion and sore throat.   Eyes:  Negative for blurred vision and photophobia.  Respiratory:  Negative for cough, shortness of breath and wheezing.   Cardiovascular:  Negative for chest pain, palpitations and leg swelling.  Gastrointestinal:  Negative for abdominal pain, blood in stool, constipation, diarrhea, heartburn, melena, nausea and vomiting.  Genitourinary:  Negative for dysuria, flank pain and hematuria.  Musculoskeletal:  Negative for back pain, falls, joint pain and myalgias.  Skin:  Negative for itching and rash.  Neurological:  Negative for dizziness, focal weakness, loss of consciousness, weakness and headaches.  Endo/Heme/Allergies:  Does not bruise/bleed easily.  Psychiatric/Behavioral:  Negative for depression and suicidal ideas. The patient does not have insomnia.     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 Lynwood Nasuti   History of radiation therapy    Left lung- 09/11/23-09/18/23- Dr. 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   Protein calorie malnutrition (HCC)    Small cell lung cancer, 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   Thrombocytopenia (HCC) 06/2018    Social History   Tobacco Use    Smoking status: Former    Current packs/day: 0.00    Types: Cigarettes    Quit date: 12/18/2022    Years since quitting: 0.9   Smokeless tobacco: Never  Vaping Use   Vaping status: Former  Substance Use Topics   Alcohol use: Yes    Comment: occasionally   Drug use: Yes    Frequency: 7.0 times per week    Types: Marijuana    Comment: smoke and gummies    Family History  Problem Relation Age of Onset   Diabetes Mother    Hypertension Mother    Diabetes Father    Hypertension Father    Healthy Sister    Healthy Brother    Healthy Sister    Healthy Sister    Healthy Sister    Healthy Brother    Healthy Brother    Heart disease Neg Hx    Cancer Neg Hx    Stroke Neg Hx    No Known Allergies  OBJECTIVE: Blood pressure (!) 128/91, pulse 87, temperature 99.3 F (37.4 C), temperature source Oral, resp. rate 18, height 6' (1.829 m), weight 77.8 kg, SpO2 100%.  Physical Exam Constitutional:      Appearance: He is well-developed.  HENT:     Head: Atraumatic.  Eyes:     Conjunctiva/sclera: Conjunctivae normal.  Cardiovascular:     Rate and Rhythm: Normal rate and regular rhythm.  Pulmonary:     Effort: Pulmonary effort is normal.  No respiratory distress.     Breath sounds: No wheezing.  Abdominal:     General: There is no distension.     Palpations: Abdomen is soft.  Musculoskeletal:        General: No tenderness. Normal range of motion.     Cervical back: Normal range of motion and neck supple.  Skin:    General: Skin is warm and dry.     Coloration: Skin is not pale.     Findings: No erythema or rash.  Neurological:     General: No focal deficit present.     Mental Status: He is alert and oriented to person, place, and time.  Psychiatric:        Mood and Affect: Mood normal.        Behavior: Behavior normal.        Thought Content: Thought content normal.        Judgment: Judgment normal.     Lab Results Lab Results  Component Value Date   WBC 9.2  12/10/2023   HGB 12.9 (L) 12/10/2023   HCT 37.6 (L) 12/10/2023   MCV 99.5 12/10/2023   PLT 102 (L) 12/10/2023    Lab Results  Component Value Date   CREATININE 0.99 12/10/2023   BUN 12 12/10/2023   NA 142 12/10/2023   K 4.1 12/10/2023   CL 104 12/10/2023   CO2 26 12/10/2023    Lab Results  Component Value Date   ALT 42 12/07/2023   AST 39 12/07/2023   ALKPHOS 97 12/07/2023   BILITOT 0.3 12/07/2023     Microbiology: Recent Results (from the past 240 hours)  Culture, blood (routine x 2)     Status: None (Preliminary result)   Collection Time: 12/09/23  8:30 PM   Specimen: BLOOD RIGHT ARM  Result Value Ref Range Status   Specimen Description BLOOD RIGHT ARM  Final   Special Requests   Final    BOTTLES DRAWN AEROBIC AND ANAEROBIC Blood Culture adequate volume   Culture   Final    NO GROWTH < 12 HOURS Performed at [facility], [address], [place], [place] [address]    Report Status PENDING  Incomplete  Culture, blood (routine x 2)     Status: None (Preliminary result)   Collection Time: 12/09/23  8:40 PM   Specimen: BLOOD  Result Value Ref Range Status   Specimen Description BLOOD LEFT ANTECUBITAL  Final   Special Requests   Final    BOTTLES DRAWN AEROBIC AND ANAEROBIC Blood Culture results may not be optimal due to an inadequate volume of blood received in culture bottles   Culture   Final    NO GROWTH < 12 HOURS Performed at [facility], [address], [place], [place] [address]    Report Status PENDING  Incomplete    Jomarie Fleeta Rothman, MD [facility] [phone] pager  12/10/2023, 11:36 AM

## 2023-12-11 ENCOUNTER — Other Ambulatory Visit (HOSPITAL_BASED_OUTPATIENT_CLINIC_OR_DEPARTMENT_OTHER): Payer: Self-pay

## 2023-12-11 ENCOUNTER — Encounter: Payer: Self-pay | Admitting: Hematology & Oncology

## 2023-12-11 ENCOUNTER — Ambulatory Visit

## 2023-12-11 ENCOUNTER — Encounter: Payer: Self-pay | Admitting: *Deleted

## 2023-12-11 ENCOUNTER — Ambulatory Visit: Attending: Radiation Oncology

## 2023-12-11 DIAGNOSIS — B999 Unspecified infectious disease: Secondary | ICD-10-CM | POA: Diagnosis not present

## 2023-12-11 DIAGNOSIS — K029 Dental caries, unspecified: Secondary | ICD-10-CM | POA: Diagnosis not present

## 2023-12-11 DIAGNOSIS — G893 Neoplasm related pain (acute) (chronic): Secondary | ICD-10-CM | POA: Diagnosis not present

## 2023-12-11 DIAGNOSIS — L03211 Cellulitis of face: Secondary | ICD-10-CM | POA: Diagnosis not present

## 2023-12-11 DIAGNOSIS — L0201 Cutaneous abscess of face: Secondary | ICD-10-CM | POA: Diagnosis not present

## 2023-12-11 DIAGNOSIS — B20 Human immunodeficiency virus [HIV] disease: Secondary | ICD-10-CM | POA: Diagnosis not present

## 2023-12-11 LAB — CD4/CD8 (T-HELPER/T-SUPPRESSOR CELL)
CD4 absolute: 89 /uL — ABNORMAL LOW (ref 400–1790)
CD4%: 13.64 % — ABNORMAL LOW (ref 33–65)
CD8 T Cell Abs: 311 /uL (ref 190–1000)
CD8tox: 47.45 % — ABNORMAL HIGH (ref 12–40)
Ratio: 0.29 — ABNORMAL LOW (ref 1.0–3.0)
Total lymphocyte count: 655 /uL — ABNORMAL LOW (ref 1000–4000)

## 2023-12-11 NOTE — Progress Notes (Signed)
 Patient came in to be seen last week. He was a no-show to his CT appointment earlier in the week. Once radiation is complete we will consider next regimen for systemic treatment.   This past weekend patient was admitted to the hospital for facial cellulitis related to dental abscess. He was transferred to [facility] for oral surgery. Will follow admission and monitor for post discharge care and office follow up.   Oncology Nurse Navigator Documentation     12/11/2023    9:30 AM  Oncology Nurse Navigator Flowsheets  Navigator Follow Up Date: 12/21/2023  Navigator Follow Up Reason: Follow-up Appointment  Navigator Location [facility]  Navigator Encounter Type Appt/Treatment Plan Review  Patient Visit Type MedOnc  Treatment Phase Active Tx  Barriers/Navigation Needs Coordination of Care;Education  Interventions None Required  Acuity Level 1-No Barriers  Time Spent with Patient 15

## 2023-12-11 NOTE — Consults (Signed)
 Vancomycin  Therapeutic Monitoring Pharmacy Note  Travante Knee is a 43 y.o. male continuing vancomycin . Date of therapy initiation: 12/10/23  Indication: Skin and Soft Tissue Infection (SSTI)  Prior Dosing Information: 1250 mg from OSH   Goals: Therapeutic Drug Levels Vancomycin  trough goal: 10-15 mg/L  Additional Clinical Monitoring/Outcomes Renal function, volume status (intake and output)  Results: Not applicable  Wt Readings from Last 1 Encounters:  12/10/23 78.6 kg (173 lb 4.8 oz)   No results found for: CREATININE   Pharmacokinetic Considerations and Significant Drug Interactions: Adult (estimated initial): Vd = 55.806 L, ke = 0.084 hr-1 Concurrent nephrotoxic meds: not applicable  Assessment/Plan: Recommendation(s) Start vancomycin  1250 mg every 12 hours Estimated trough on recommended regimen: 13 mg/L  Follow-up Level due: prior to fourth or fifth dose A pharmacist will continue to monitor and order levels as appropriate  Please page service pharmacist with questions/clarifications.  TRANG NGUYEN, PHARMD

## 2023-12-12 ENCOUNTER — Ambulatory Visit: Admitting: Hematology & Oncology

## 2023-12-12 ENCOUNTER — Other Ambulatory Visit

## 2023-12-12 ENCOUNTER — Ambulatory Visit

## 2023-12-12 ENCOUNTER — Telehealth: Payer: Self-pay

## 2023-12-12 ENCOUNTER — Inpatient Hospital Stay

## 2023-12-12 DIAGNOSIS — E43 Unspecified severe protein-calorie malnutrition: Secondary | ICD-10-CM | POA: Diagnosis not present

## 2023-12-12 DIAGNOSIS — B20 Human immunodeficiency virus [HIV] disease: Secondary | ICD-10-CM | POA: Diagnosis not present

## 2023-12-12 DIAGNOSIS — L0201 Cutaneous abscess of face: Secondary | ICD-10-CM | POA: Diagnosis not present

## 2023-12-12 DIAGNOSIS — L03211 Cellulitis of face: Secondary | ICD-10-CM | POA: Diagnosis not present

## 2023-12-12 NOTE — Telephone Encounter (Signed)
 Received a call from patient and Dr. Jama at [facility] to make us  aware that patient had been admitted for emergency tooth extraction. Dr. Jama questioning if its ok for  patients brain radiation treatments to be on hold until after dc from [facility]. Per Dr. Shannon patient may resume his radiation treatments after discharge. Dr. Jama to call in the next few days with discharge date.

## 2023-12-13 ENCOUNTER — Ambulatory Visit

## 2023-12-13 DIAGNOSIS — K0889 Other specified disorders of teeth and supporting structures: Secondary | ICD-10-CM | POA: Diagnosis not present

## 2023-12-13 DIAGNOSIS — B9689 Other specified bacterial agents as the cause of diseases classified elsewhere: Secondary | ICD-10-CM | POA: Diagnosis not present

## 2023-12-13 DIAGNOSIS — L03211 Cellulitis of face: Secondary | ICD-10-CM | POA: Diagnosis not present

## 2023-12-13 DIAGNOSIS — L0201 Cutaneous abscess of face: Secondary | ICD-10-CM | POA: Diagnosis not present

## 2023-12-13 LAB — HIV-1 RNA QUANT-NO REFLEX-BLD
HIV 1 RNA Quant: <20 {copies}/mL
LOG10 HIV-1 RNA: UNDETERMINED {Log_copies}/mL

## 2023-12-14 ENCOUNTER — Encounter: Payer: Self-pay | Admitting: *Deleted

## 2023-12-14 ENCOUNTER — Encounter: Payer: Self-pay | Admitting: Hematology & Oncology

## 2023-12-14 ENCOUNTER — Other Ambulatory Visit (HOSPITAL_BASED_OUTPATIENT_CLINIC_OR_DEPARTMENT_OTHER): Payer: Self-pay

## 2023-12-14 ENCOUNTER — Ambulatory Visit

## 2023-12-14 DIAGNOSIS — K0889 Other specified disorders of teeth and supporting structures: Secondary | ICD-10-CM | POA: Diagnosis not present

## 2023-12-14 DIAGNOSIS — B9689 Other specified bacterial agents as the cause of diseases classified elsewhere: Secondary | ICD-10-CM | POA: Diagnosis not present

## 2023-12-14 DIAGNOSIS — B20 Human immunodeficiency virus [HIV] disease: Secondary | ICD-10-CM | POA: Diagnosis not present

## 2023-12-14 DIAGNOSIS — L0201 Cutaneous abscess of face: Secondary | ICD-10-CM | POA: Diagnosis not present

## 2023-12-14 LAB — CULTURE, BLOOD (ROUTINE X 2)
Culture: NO GROWTH
Culture: NO GROWTH
Special Requests: ADEQUATE

## 2023-12-14 NOTE — Progress Notes (Signed)
 Patient recently admitted and treated for dental abscess in an outside health system. He is now discharged. Spoke to Dr Timmy. He would like to see patient in about two weeks. Message sent to scheduling.   Oncology Nurse Navigator Documentation     12/14/2023    2:00 PM  Oncology Nurse Navigator Flowsheets  Navigator Follow Up Date: 12/28/2023  Navigator Follow Up Reason: Follow-up Appointment  Navigator Location [facility]  Navigator Encounter Type Appt/Treatment Plan Review  Patient Visit Type MedOnc  Treatment Phase Active Tx  Barriers/Navigation Needs Coordination of Care;Education  Interventions Coordination of Care  Acuity Level 2-Minimal Needs (1-2 Barriers Identified)  Coordination of Care Appts  Time Spent with Patient 15

## 2023-12-17 ENCOUNTER — Encounter: Payer: Self-pay | Admitting: Hematology & Oncology

## 2023-12-17 ENCOUNTER — Other Ambulatory Visit: Payer: Self-pay

## 2023-12-17 ENCOUNTER — Ambulatory Visit

## 2023-12-18 ENCOUNTER — Ambulatory Visit

## 2023-12-18 ENCOUNTER — Ambulatory Visit: Admitting: Internal Medicine

## 2023-12-18 ENCOUNTER — Emergency Department (HOSPITAL_COMMUNITY)

## 2023-12-18 ENCOUNTER — Other Ambulatory Visit: Payer: Self-pay

## 2023-12-18 ENCOUNTER — Encounter (HOSPITAL_COMMUNITY): Payer: Self-pay

## 2023-12-18 ENCOUNTER — Emergency Department (HOSPITAL_COMMUNITY)
Admission: EM | Admit: 2023-12-18 | Discharge: 2023-12-19 | Discharge status: Against Medical Advice | Attending: Emergency Medicine | Admitting: Emergency Medicine

## 2023-12-18 DIAGNOSIS — R079 Chest pain, unspecified: Secondary | ICD-10-CM | POA: Insufficient documentation

## 2023-12-18 DIAGNOSIS — R918 Other nonspecific abnormal finding of lung field: Secondary | ICD-10-CM | POA: Diagnosis not present

## 2023-12-18 DIAGNOSIS — I2510 Atherosclerotic heart disease of native coronary artery without angina pectoris: Secondary | ICD-10-CM | POA: Diagnosis not present

## 2023-12-18 DIAGNOSIS — R0789 Other chest pain: Secondary | ICD-10-CM | POA: Diagnosis not present

## 2023-12-18 DIAGNOSIS — C782 Secondary malignant neoplasm of pleura: Secondary | ICD-10-CM | POA: Diagnosis not present

## 2023-12-18 DIAGNOSIS — Z85118 Personal history of other malignant neoplasm of bronchus and lung: Secondary | ICD-10-CM | POA: Insufficient documentation

## 2023-12-18 DIAGNOSIS — R0602 Shortness of breath: Secondary | ICD-10-CM | POA: Insufficient documentation

## 2023-12-18 DIAGNOSIS — C7800 Secondary malignant neoplasm of unspecified lung: Secondary | ICD-10-CM | POA: Diagnosis not present

## 2023-12-18 DIAGNOSIS — R1110 Vomiting, unspecified: Secondary | ICD-10-CM | POA: Insufficient documentation

## 2023-12-18 DIAGNOSIS — R42 Dizziness and giddiness: Secondary | ICD-10-CM | POA: Insufficient documentation

## 2023-12-18 DIAGNOSIS — Z5321 Procedure and treatment not carried out due to patient leaving prior to being seen by health care provider: Secondary | ICD-10-CM | POA: Diagnosis not present

## 2023-12-18 DIAGNOSIS — J90 Pleural effusion, not elsewhere classified: Secondary | ICD-10-CM | POA: Diagnosis not present

## 2023-12-18 DIAGNOSIS — J929 Pleural plaque without asbestos: Secondary | ICD-10-CM | POA: Diagnosis not present

## 2023-12-18 DIAGNOSIS — R590 Localized enlarged lymph nodes: Secondary | ICD-10-CM | POA: Diagnosis not present

## 2023-12-18 LAB — I-STAT CHEM 8, ED
BUN: 26 mg/dL — ABNORMAL HIGH (ref 6–20)
Calcium, Ion: 1.13 mmol/L — ABNORMAL LOW (ref 1.15–1.40)
Chloride: 105 mmol/L (ref 98–111)
Creatinine, Ser: 1.3 mg/dL — ABNORMAL HIGH (ref 0.61–1.24)
Glucose, Bld: 148 mg/dL — ABNORMAL HIGH (ref 70–99)
HCT: 42 % (ref 39.0–52.0)
Hemoglobin: 14.3 g/dL (ref 13.0–17.0)
Potassium: 4.6 mmol/L (ref 3.5–5.1)
Sodium: 137 mmol/L (ref 135–145)
TCO2: 18 mmol/L — ABNORMAL LOW (ref 22–32)

## 2023-12-18 LAB — BASIC METABOLIC PANEL WITH GFR
Anion gap: 19 — ABNORMAL HIGH (ref 5–15)
BUN: 25 mg/dL — ABNORMAL HIGH (ref 6–20)
CO2: 15 mmol/L — ABNORMAL LOW (ref 22–32)
Calcium: 9.3 mg/dL (ref 8.9–10.3)
Chloride: 103 mmol/L (ref 98–111)
Creatinine, Ser: 1.37 mg/dL — ABNORMAL HIGH (ref 0.61–1.24)
GFR, Estimated: >60 mL/min (ref >60)
Glucose, Bld: 148 mg/dL — ABNORMAL HIGH (ref 70–99)
Potassium: 4.8 mmol/L (ref 3.5–5.1)
Sodium: 137 mmol/L (ref 135–145)

## 2023-12-18 LAB — CBC
HCT: 41.9 % (ref 39.0–52.0)
Hemoglobin: 13.8 g/dL (ref 13.0–17.0)
MCH: 33.3 pg (ref 26.0–34.0)
MCHC: 32.9 g/dL (ref 30.0–36.0)
MCV: 101.2 fL — ABNORMAL HIGH (ref 80.0–100.0)
Platelets: 102 K/uL — ABNORMAL LOW (ref 150–400)
RBC: 4.14 MIL/uL — ABNORMAL LOW (ref 4.22–5.81)
RDW: 18 % — ABNORMAL HIGH (ref 11.5–15.5)
WBC: 8.4 K/uL (ref 4.0–10.5)
nRBC: 0.4 % — ABNORMAL HIGH (ref 0.0–0.2)

## 2023-12-18 LAB — TROPONIN I (HIGH SENSITIVITY)
Troponin I (High Sensitivity): 6 ng/L (ref <18)
Troponin I (High Sensitivity): 5 ng/L (ref <18)

## 2023-12-18 NOTE — ED Triage Notes (Addendum)
 Patient presents to ED with chest pain that radiates to back for the past day that has been getting worse. Patient states that the pain is in his left chest it also radiates to the left flank and across the abdomen. Pt states that he has some slight shob, vomiting, a little dizzy.  Patient diagnosed with lung cancer in November 2024

## 2023-12-18 NOTE — ED Provider Triage Note (Signed)
 Emergency Medicine Provider Triage Evaluation Note  Christopher Henson , a 43 y.o. male  was evaluated in triage.  Pt complains of chest pain radiating all the way to his back.  Last bowel movement 2 days ago.  He is currently undergoing chemo after diagnosis of lung cancer in November 2024.  He is on pain medication that he believes is likely making him strain.  However, did have a small bowel movement this morning.  Reports being anticoagulated but unsure which medication  Review of Systems  Positive: Chest pain, shortness of breath Negative: Fever, nausea, vomiting  Physical Exam  BP (!) 139/118 (BP Location: Right Arm)   Pulse (!) 104   Temp 98.4 F (36.9 C)   Resp 20   Ht 6' (1.829 m)   Wt 77.1 kg   SpO2 100%   BMI 23.06 kg/m  Gen:   Awake, no distress   Resp:  Normal effort  MSK:   Moves extremities without difficulty Other:  Bowel sounds are decreased, abdomen is very distended.  Medical Decision Making  Medically screening exam initiated at 4:50 PM.  Appropriate orders placed.  Christopher Henson was informed that the remainder of the evaluation will be completed by another provider, this initial triage assessment does not replace that evaluation, and the importance of remaining in the ED until their evaluation is complete.     Willadeen Colantuono, PA-C 12/18/23 1651

## 2023-12-18 NOTE — Care Plan (Signed)
 Transition of Care Encounter Data   Call attempt: 1 Admission date: 12/10/23 Discharge date: 12/14/23 Discharge diagnosis: Facial abscess Do you have a hospital follow up appointment?: Yes - Within 14 Days, Yes with Specialty Provider clinic F/U Date: 12/21/23 F/U Provider: Rosina Kennedy Patient post discharge: Medications:      SABRA   [facility]: [phone]:  .  [facility]: [phone]:  .  Other: Contact PCP:        [facility] TRANSITIONAL CASE MANAGEMENT SUMMARY NOTE   Attempted to contact patient today at Cell to complete Transitional Case Management call from [facility]. No answer/unable to leave message; 1st attempt.          Nisha Subedi Sarma, RN

## 2023-12-18 NOTE — ED Notes (Signed)
 Pt stepped outside, stated will return to lobby.

## 2023-12-19 ENCOUNTER — Inpatient Hospital Stay (HOSPITAL_COMMUNITY)
Admission: EM | Admit: 2023-12-19 | Discharge: 2024-01-09 | DRG: 683 | Disposition: E | Attending: Internal Medicine | Admitting: Internal Medicine

## 2023-12-19 ENCOUNTER — Ambulatory Visit

## 2023-12-19 ENCOUNTER — Other Ambulatory Visit: Payer: Self-pay

## 2023-12-19 ENCOUNTER — Emergency Department (HOSPITAL_COMMUNITY)

## 2023-12-19 ENCOUNTER — Encounter (HOSPITAL_COMMUNITY): Payer: Self-pay | Admitting: Emergency Medicine

## 2023-12-19 DIAGNOSIS — G893 Neoplasm related pain (acute) (chronic): Secondary | ICD-10-CM | POA: Diagnosis present

## 2023-12-19 DIAGNOSIS — R0602 Shortness of breath: Secondary | ICD-10-CM | POA: Diagnosis not present

## 2023-12-19 DIAGNOSIS — N179 Acute kidney failure, unspecified: Principal | ICD-10-CM | POA: Diagnosis present

## 2023-12-19 DIAGNOSIS — E441 Mild protein-calorie malnutrition: Secondary | ICD-10-CM | POA: Diagnosis present

## 2023-12-19 DIAGNOSIS — Z9981 Dependence on supplemental oxygen: Secondary | ICD-10-CM

## 2023-12-19 DIAGNOSIS — Z79899 Other long term (current) drug therapy: Secondary | ICD-10-CM

## 2023-12-19 DIAGNOSIS — G4700 Insomnia, unspecified: Secondary | ICD-10-CM | POA: Diagnosis present

## 2023-12-19 DIAGNOSIS — D696 Thrombocytopenia, unspecified: Secondary | ICD-10-CM | POA: Diagnosis present

## 2023-12-19 DIAGNOSIS — Z8249 Family history of ischemic heart disease and other diseases of the circulatory system: Secondary | ICD-10-CM

## 2023-12-19 DIAGNOSIS — C7A1 Malignant poorly differentiated neuroendocrine tumors: Secondary | ICD-10-CM | POA: Diagnosis not present

## 2023-12-19 DIAGNOSIS — C7931 Secondary malignant neoplasm of brain: Secondary | ICD-10-CM | POA: Diagnosis not present

## 2023-12-19 DIAGNOSIS — R0600 Dyspnea, unspecified: Secondary | ICD-10-CM

## 2023-12-19 DIAGNOSIS — B20 Human immunodeficiency virus [HIV] disease: Secondary | ICD-10-CM | POA: Diagnosis present

## 2023-12-19 DIAGNOSIS — C782 Secondary malignant neoplasm of pleura: Secondary | ICD-10-CM | POA: Diagnosis not present

## 2023-12-19 DIAGNOSIS — R590 Localized enlarged lymph nodes: Secondary | ICD-10-CM | POA: Diagnosis not present

## 2023-12-19 DIAGNOSIS — F419 Anxiety disorder, unspecified: Secondary | ICD-10-CM | POA: Diagnosis not present

## 2023-12-19 DIAGNOSIS — R54 Age-related physical debility: Secondary | ICD-10-CM | POA: Diagnosis present

## 2023-12-19 DIAGNOSIS — Z7189 Other specified counseling: Secondary | ICD-10-CM

## 2023-12-19 DIAGNOSIS — E875 Hyperkalemia: Secondary | ICD-10-CM | POA: Diagnosis not present

## 2023-12-19 DIAGNOSIS — K5900 Constipation, unspecified: Secondary | ICD-10-CM | POA: Diagnosis present

## 2023-12-19 DIAGNOSIS — Z21 Asymptomatic human immunodeficiency virus [HIV] infection status: Secondary | ICD-10-CM | POA: Diagnosis present

## 2023-12-19 DIAGNOSIS — C7800 Secondary malignant neoplasm of unspecified lung: Principal | ICD-10-CM

## 2023-12-19 DIAGNOSIS — Z87891 Personal history of nicotine dependence: Secondary | ICD-10-CM

## 2023-12-19 DIAGNOSIS — M272 Inflammatory conditions of jaws: Secondary | ICD-10-CM | POA: Diagnosis present

## 2023-12-19 DIAGNOSIS — E860 Dehydration: Secondary | ICD-10-CM | POA: Diagnosis present

## 2023-12-19 DIAGNOSIS — K122 Cellulitis and abscess of mouth: Secondary | ICD-10-CM | POA: Diagnosis present

## 2023-12-19 DIAGNOSIS — K5903 Drug induced constipation: Secondary | ICD-10-CM | POA: Diagnosis not present

## 2023-12-19 DIAGNOSIS — R627 Adult failure to thrive: Secondary | ICD-10-CM | POA: Diagnosis present

## 2023-12-19 DIAGNOSIS — Z515 Encounter for palliative care: Secondary | ICD-10-CM

## 2023-12-19 DIAGNOSIS — F32A Depression, unspecified: Secondary | ICD-10-CM | POA: Diagnosis present

## 2023-12-19 DIAGNOSIS — R64 Cachexia: Secondary | ICD-10-CM | POA: Diagnosis present

## 2023-12-19 DIAGNOSIS — Z66 Do not resuscitate: Secondary | ICD-10-CM | POA: Diagnosis not present

## 2023-12-19 DIAGNOSIS — I2510 Atherosclerotic heart disease of native coronary artery without angina pectoris: Secondary | ICD-10-CM | POA: Diagnosis not present

## 2023-12-19 DIAGNOSIS — Z6822 Body mass index (BMI) 22.0-22.9, adult: Secondary | ICD-10-CM

## 2023-12-19 DIAGNOSIS — Z833 Family history of diabetes mellitus: Secondary | ICD-10-CM

## 2023-12-19 DIAGNOSIS — Z923 Personal history of irradiation: Secondary | ICD-10-CM

## 2023-12-19 DIAGNOSIS — C7951 Secondary malignant neoplasm of bone: Secondary | ICD-10-CM | POA: Diagnosis present

## 2023-12-19 DIAGNOSIS — R110 Nausea: Secondary | ICD-10-CM

## 2023-12-19 LAB — HEPATIC FUNCTION PANEL
ALT: 190 U/L — ABNORMAL HIGH (ref 0–44)
AST: 193 U/L — ABNORMAL HIGH (ref 15–41)
Albumin: 4.2 g/dL (ref 3.5–5.0)
Alkaline Phosphatase: 197 U/L — ABNORMAL HIGH (ref 38–126)
Bilirubin, Direct: 0.4 mg/dL — ABNORMAL HIGH (ref 0.0–0.2)
Indirect Bilirubin: 0.6 mg/dL (ref 0.3–0.9)
Total Bilirubin: 1.1 mg/dL (ref 0.0–1.2)
Total Protein: 7 g/dL (ref 6.5–8.1)

## 2023-12-19 LAB — GLUCOSE, CAPILLARY: Glucose-Capillary: 109 mg/dL — ABNORMAL HIGH (ref 70–99)

## 2023-12-19 LAB — TROPONIN T, HIGH SENSITIVITY: Troponin T High Sensitivity: <15 ng/L (ref 0–19)

## 2023-12-19 MED ORDER — ACETAMINOPHEN 325 MG PO TABS: 650.0000 mg | ORAL_TABLET | Freq: Four times a day (QID) | ORAL | Status: DC | PRN

## 2023-12-19 MED ORDER — HYDROMORPHONE HCL 1 MG/ML IJ SOLN
1.0000 mg | INTRAMUSCULAR | Status: DC | PRN
  Administered 2023-12-20: 2 mg via INTRAVENOUS
  Filled 2023-12-19: qty 2

## 2023-12-19 MED ORDER — ONDANSETRON HCL 4 MG/2ML IJ SOLN
4.0000 mg | Freq: Four times a day (QID) | INTRAMUSCULAR | Status: DC | PRN
  Administered 2023-12-20 – 2023-12-22 (×4): 4 mg via INTRAVENOUS
  Filled 2023-12-19 (×5): qty 2

## 2023-12-19 MED ORDER — ONDANSETRON HCL 4 MG PO TABS: 4.0000 mg | ORAL_TABLET | Freq: Four times a day (QID) | ORAL | Status: DC | PRN

## 2023-12-19 MED ORDER — ACETAMINOPHEN 325 MG PO TABS
650.0000 mg | ORAL_TABLET | Freq: Three times a day (TID) | ORAL | Status: DC
Start: 2023-12-19 — End: 2023-12-24
  Administered 2023-12-19 – 2023-12-23 (×14): 650 mg via ORAL
  Filled 2023-12-19 (×15): qty 2

## 2023-12-19 MED ORDER — HYDROMORPHONE HCL 1 MG/ML IJ SOLN
1.0000 mg | INTRAMUSCULAR | Status: AC
  Administered 2023-12-19: 1 mg via INTRAVENOUS
  Filled 2023-12-19: qty 1

## 2023-12-19 MED ORDER — SULFAMETHOXAZOLE-TRIMETHOPRIM 800-160 MG PO TABS
1.0000 | ORAL_TABLET | Freq: Every day | ORAL | Status: DC
  Administered 2023-12-19 – 2023-12-23 (×5): 1 via ORAL
  Filled 2023-12-19 (×6): qty 1

## 2023-12-19 MED ORDER — HYDROMORPHONE HCL 1 MG/ML IJ SOLN
0.5000 mg | INTRAMUSCULAR | Status: DC | PRN
  Administered 2023-12-19: 1 mg via INTRAVENOUS
  Filled 2023-12-19: qty 1

## 2023-12-19 MED ORDER — SENNA 8.6 MG PO TABS
1.0000 | ORAL_TABLET | Freq: Every day | ORAL | Status: DC
  Administered 2023-12-19: 8.6 mg via ORAL
  Filled 2023-12-19: qty 1

## 2023-12-19 MED ORDER — ACETAMINOPHEN 650 MG RE SUPP: 650.0000 mg | Freq: Four times a day (QID) | RECTAL | Status: DC | PRN

## 2023-12-19 MED ORDER — HYDROMORPHONE HCL 1 MG/ML IJ SOLN: 1.0000 mg | INTRAMUSCULAR | Status: DC | PRN

## 2023-12-19 MED ORDER — SODIUM CHLORIDE 0.9 % IV SOLN
INTRAVENOUS | Status: AC
Start: 2023-12-19 — End: 2023-12-19

## 2023-12-19 MED ORDER — ALBUTEROL SULFATE (2.5 MG/3ML) 0.083% IN NEBU: 2.5000 mg | INHALATION_SOLUTION | RESPIRATORY_TRACT | Status: DC | PRN

## 2023-12-19 MED ORDER — AMOXICILLIN-POT CLAVULANATE 875-125 MG PO TABS
1.0000 | ORAL_TABLET | Freq: Two times a day (BID) | ORAL | Status: DC
  Administered 2023-12-19 – 2023-12-23 (×10): 1 via ORAL
  Filled 2023-12-19 (×11): qty 1

## 2023-12-19 MED ORDER — OXYCODONE HCL 5 MG PO TABS: 10.0000 mg | ORAL_TABLET | ORAL | Status: DC | PRN

## 2023-12-19 MED ORDER — OXYCODONE HCL ER 15 MG PO T12A
15.0000 mg | EXTENDED_RELEASE_TABLET | Freq: Three times a day (TID) | ORAL | Status: DC
  Administered 2023-12-19 – 2023-12-20 (×3): 15 mg via ORAL
  Filled 2023-12-19 (×3): qty 1

## 2023-12-19 MED ORDER — DOXYCYCLINE HYCLATE 100 MG PO TABS
100.0000 mg | ORAL_TABLET | Freq: Two times a day (BID) | ORAL | Status: DC
  Administered 2023-12-19 – 2023-12-23 (×10): 100 mg via ORAL
  Filled 2023-12-19 (×11): qty 1

## 2023-12-19 MED ORDER — IOHEXOL 350 MG/ML SOLN
75.0000 mL | Freq: Once | INTRAVENOUS | Status: AC | PRN
  Administered 2023-12-19: 75 mL via INTRAVENOUS

## 2023-12-19 MED ORDER — BICTEGRAVIR-EMTRICITAB-TENOFOV 50-200-25 MG PO TABS
1.0000 | ORAL_TABLET | Freq: Every day | ORAL | Status: DC
  Administered 2023-12-20 – 2023-12-23 (×4): 1 via ORAL
  Filled 2023-12-19 (×6): qty 1

## 2023-12-19 NOTE — H&P (Signed)
 History and Physical  Christopher Henson FMW:[id number] DOB: [birth year] DOA: 12/19/2023  PCP: Bulah Alm RAMAN, PA-C   Chief Complaint: Chest pain  HPI: Christopher Henson is a very unfortunate 43 y.o. 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  He was recently hospitalized at [facility] for odontogenic abscess and associated cellulitis, was transferred to [facility] on 9/1 for oral surgery.  He was seen by OMFS and underwent multiple tooth extractions and washout of oral abscess.  He was discharged home on a course of Augmentin  and doxycycline , with plan for follow-up with ID physician Dr. Overton.  Patient presented to the emergency department last night with complaints of chest discomfort, vague nausea.  He left without being seen, and returned once again this morning with the same complaints.  He tells me that he has been having chest pain and shortness of breath, as well as some nausea for the last several days.  I noted that he also was complaining of chest pain when he was last admitted to [facility] on 8/31, he tells me that the chest discomfort is no different.  He denies any fevers or chills, denies diarrhea or vomiting.  Tells me that he has been able to eat and drink, and has been taking his prescribed medications including antibiotics.  On evaluation today in the emergency department, he has evidence of dehydration with AKI.  CT scan as detailed below was done which shows progression of lymphadenopathy when compared to CT scan done on 9/1.  Due to his continued symptoms, AKI, and rapid progression of disease, hospitalist admission was requested.  Review of Systems: Please see HPI for pertinent positives and negatives. A complete 10 system review of systems are otherwise negative.  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 Lynwood Nasuti   History of radiation therapy    Left lung- 09/11/23-09/18/23- Dr. 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   Protein calorie malnutrition (HCC)    Small cell lung cancer, 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   Thrombocytopenia (HCC) 06/2018   Past Surgical History:  Procedure Laterality Date   BIOPSY  12/21/2022   Procedure: BIOPSY;  Surgeon: Neda Jennet LABOR, MD;  Location: [facility];  Service: Endoscopy;;   BRONCHIAL WASHINGS  12/21/2022   Procedure: BRONCHIAL WASHINGS;  Surgeon: Neda Jennet LABOR, MD;  Location: [facility];  Service: Endoscopy;;   ENDOBRONCHIAL ULTRASOUND Left 12/21/2022   Procedure: ENDOBRONCHIAL ULTRASOUND;  Surgeon: Neda Jennet LABOR, MD;  Location: [facility];  Service: Endoscopy;  Laterality: Left;  Endobronchial ultrasound, biopsy, need fluoroscopy   FINE NEEDLE ASPIRATION  12/21/2022   Procedure: FINE NEEDLE ASPIRATION (FNA) LINEAR;  Surgeon: Neda Jennet LABOR, MD;  Location: [facility];  Service: Endoscopy;;   IR BONE MARROW BIOPSY & ASPIRATION  12/27/2022   IR IMAGING GUIDED PORT INSERTION  12/27/2022   IR LUMBAR PUNCTURE  12/22/2022   IR PERC PLEURAL DRAIN W/INDWELL CATH W/IMG GUIDE  01/15/2023   IR REMOVAL OF PLURAL CATH W/CUFF  03/27/2023   LUMBAR PUNCTURE  06/2018   during eval for meningitis  RADIOLOGY WITH ANESTHESIA N/A 11/01/2023   Procedure: MRI WITH ANESTHESIA;  Surgeon: Radiologist, Medication, MD;  Location: [facility] OR;  Service: Radiology;  Laterality: N/A;   VIDEO BRONCHOSCOPY Left 12/21/2022   Procedure: VIDEO BRONCHOSCOPY WITH FLUORO, endobronchial ultrasound;  Surgeon: Neda Jennet LABOR, MD;  Location: [facility];   Service: Endoscopy;  Laterality: Left;  Schedule for 12/20/2022 12/21/2022   Social History:  reports that he quit smoking about a year ago. His smoking use included cigarettes. He has never used smokeless tobacco. He reports current alcohol use. He reports current drug use. Frequency: 7.00 times per week. Drug: Marijuana.  No Known Allergies  Family History  Problem Relation Age of Onset   Diabetes Mother    Hypertension Mother    Diabetes Father    Hypertension Father    Healthy Sister    Healthy Brother    Healthy Sister    Healthy Sister    Healthy Sister    Healthy Brother    Healthy Brother    Heart disease Neg Hx    Cancer Neg Hx    Stroke Neg Hx      Prior to Admission medications   Medication Sig Start Date End Date Taking? Authorizing Provider  acetaminophen  (TYLENOL ) 325 MG tablet Take 2 tablets (650 mg total) by mouth every 6 (six) hours as needed. 01/17/23   Ghimire, Kuber, MD  Ampicillin -Sulbactam 3 g in sodium chloride  0.9 % 100 mL Inject 3 g into the vein every 6 (six) hours. 12/10/23   Laurence Locus, DO  bictegravir-emtricitabine -tenofovir  AF (BIKTARVY ) 50-200-25 MG TABS tablet Take 1 tablet by mouth daily. 11/14/23   Vu, Constance T, MD  brimonidine  (ALPHAGAN ) 0.2 % ophthalmic solution Place 1 drop into the right eye 2 (two) times daily. 11/05/23     dexamethasone  (DECADRON ) 4 MG tablet Take 3 tablets (12 mg total) by mouth daily. 12/10/23   Laurence Locus, DO  folic acid  (FOLVITE ) 1 MG tablet Take 2 tablets (2 mg total) by mouth daily. 10/04/23   Timmy Maude SAUNDERS, MD  HYDROmorphone  (DILAUDID ) 1 MG/ML injection Inject 0.5 mLs (0.5 mg total) into the vein every 3 (three) hours as needed (breakthrough pain not relieved by po oxycodone .). 12/10/23   Laurence Locus, DO  LORazepam  (ATIVAN ) 1 MG tablet Take 1 tablet (1 mg total) by mouth every 8 (eight) hours. Take 1 hour prior to radiation treatment, do not drive after taking med Patient not taking: No sig reported 11/20/23   Shannon Agent, MD   megestrol  (MEGACE  ES) 625 MG/5ML suspension Take 5 mLs (625 mg total) by mouth daily. Patient not taking: Reported on 12/10/2023 11/14/23   Timmy Maude SAUNDERS, MD  oxyCODONE  (OXYCONTIN ) 15 mg 12 hr tablet Take 1 tablet (15 mg total) by mouth every 8 (eight) hours. 12/07/23   Timmy Maude SAUNDERS, MD  Oxycodone  HCl 10 MG TABS Take 1 tablet (10 mg total) by mouth every 4 (four) hours as needed. 12/07/23   Timmy Maude SAUNDERS, MD  OXYGEN  Inhale 2 L into the lungs daily as needed.    [provider]  prednisoLONE  acetate (PRED FORTE ) 1 % ophthalmic suspension Instill 1 (one) drop into RIGHT eye every hour while awake today, then every 2 (two) hours while awake tomorrow and thereafter (e.g. 6-7 times per day) 10/31/23     senna-docusate (SENOKOT-S) 8.6-50 MG tablet Take 1 tablet by mouth 2 (two) times daily between meals as needed for mild constipation. 01/03/23   Samtani, Jai-Gurmukh, MD  Physical Exam: BP (!) 138/103   Pulse 85   Temp (!) 97.5 F (36.4 C) (Oral)   Resp 15   SpO2 98%  General:  Alert, oriented, calm, in no acute distress but looks chronically ill. Cardiovascular: RRR, no murmurs or rubs, no peripheral edema  Respiratory: Breath sounds are distant bilaterally, no wheezes, no crackles, no tachypnea or respiratory distress Abdomen: soft, nontender, mildly distended, normal bowel tones heard  Skin: dry, no rashes  Musculoskeletal: no joint effusions, normal range of motion  Psychiatric: appropriate affect, normal speech  Neurologic: extraocular muscles intact, clear speech, moving all extremities with intact sensorium         Labs on Admission:  Basic Metabolic Panel: Recent Labs  Lab 12/18/23 1508 12/18/23 1600  NA 137 137  K 4.8 4.6  CL 103 105  CO2 15*  --   GLUCOSE 148* 148*  BUN 25* 26*  CREATININE 1.37* 1.30*  CALCIUM  9.3  --    Liver Function Tests: No results for input(s): AST, ALT, ALKPHOS, BILITOT, PROT, ALBUMIN  in the last 168 hours. No results  for input(s): LIPASE, AMYLASE in the last 168 hours. No results for input(s): AMMONIA in the last 168 hours. CBC: Recent Labs  Lab 12/18/23 1508 12/18/23 1600  WBC 8.4  --   HGB 13.8 14.3  HCT 41.9 42.0  MCV 101.2*  --   PLT 102*  --    Cardiac Enzymes: No results for input(s): CKTOTAL, CKMB, CKMBINDEX, TROPONINI in the last 168 hours. BNP (last 3 results) Recent Labs    11/02/23 0955  BNP 42.5    ProBNP (last 3 results) No results for input(s): PROBNP in the last 8760 hours.  CBG: No results for input(s): GLUCAP in the last 168 hours.  Radiological Exams on Admission: CT Angio Chest PE W/Cm &/Or Wo Cm Result Date: 12/19/2023 CLINICAL DATA:  Dyspnea, chronic, chest wall or pleura disease suspected. History of lung cancer. * Tracking Code: BO * EXAM: CT ANGIOGRAPHY CHEST WITH CONTRAST TECHNIQUE: Multidetector CT imaging of the chest was performed using the standard protocol during bolus administration of intravenous contrast. Multiplanar CT image reconstructions and MIPs were obtained to evaluate the vascular anatomy. RADIATION DOSE REDUCTION: This exam was performed according to the departmental dose-optimization program which includes automated exposure control, adjustment of the mA and/or kV according to patient size and/or use of iterative reconstruction technique. CONTRAST:  75mL OMNIPAQUE  IOHEXOL  350 MG/ML SOLN COMPARISON:  CT angiography chest from 12/10/2023 and 11/24/2023. FINDINGS: Cardiovascular: No evidence of embolism to the proximal subsegmental pulmonary artery level. Normal cardiac size. No pericardial effusion. No aortic aneurysm. There are coronary artery calcifications, in keeping with coronary artery disease. Mediastinum/Nodes: Visualized thyroid  gland appears grossly unremarkable. There are multiple mildly hyperattenuating masses abutting the mediastinal pleura with largest along the left upper anteromedial hemithorax measuring up to 3.9 x 5.1.  These are highly concerning for pleural metastases. The esophagus is nondistended precluding optimal assessment. There are multiple enlarged mediastinal, bilateral hilar, left lower cervical and left axillary lymph nodes with largest in the left axilla measuring up to 2.5 x 3.9 cm, compatible with metastasis. When compared to the prior study, there is interval increase in the left axillary lymph node. Crohn's also appear slightly enlarged when compared to the prior exam, favoring worsening metastatic. No right axillary lymphadenopathy by size criteria. Lungs/Pleura: The central tracheo-bronchial tree is patent. Redemonstration of moderate irregular nodular pleural thickening especially along the left lower hemithorax, compatible pleural metastases. There also  multiple solid noncalcified nodules mainly in the right lung upper lobe largest measuring up to 3 x 4 mm. The pre-existing nodules appears grossly unchanged however, there are several new 1-2 mm nodules (for example, series 12, images 30 and 31), compatible with worsening metastases. No pleural effusion, lung mass or pneumothorax. There are multiple solid noncalcified opacities along the left major fissure, similar to prior study which may represent intra fissural lymph node versus pleural metastasis. No significant interval change. Upper Abdomen: There is pseudo cirrhotic appearance of the liver due to multiple underlying ill-defined hypoattenuating masses, compatible with metastasis. The masses are not well seen and therefore comparison with prior imaging is not performed. There also enlarged left retrocrural nodal mass (series 4, image 117), similar to the prior study. There is markedly enlarged left adrenal gland, compatible with metastasis. No significant interval change. There also multiple peripancreatic lymph nodes, metastatic as well. Remaining visualized upper abdominal viscera within normal limits. Musculoskeletal: A CT Port-a-Cath is seen in the  right upper chest wall with the catheter terminating in the cavo-atrial junction region. Visualized soft tissues of the chest wall are otherwise grossly unremarkable. Redemonstration of sclerotic lesions in the sternum, compatible with metastases. No pathological fracture. There are mild multilevel degenerative changes in the visualized spine. Review of the MIP images confirms the above findings. IMPRESSION: 1. No embolism to the proximal subsegmental pulmonary artery level. 2. Redemonstration of extensive metastatic disease involving the pleura, mediastinal/hilar lymph nodes, left lower cervical and left axillary lymph nodes, liver, left adrenal gland, peripancreatic lymph nodes, left retrocrural lymph nodes and bones. There is interval increase in the size of left axillary lymph node as well as several new right lung nodules, compatible with worsening metastatic disease. 3. Multiple other nonacute observations, as described above. Electronically Signed   By: Ree Molt M.D.   On: 12/19/2023 10:26   DG Chest 2 View Result Date: 12/18/2023 CLINICAL DATA:  Chest pain and shortness of breath. EXAM: CHEST - 2 VIEW COMPARISON:  December 09, 2023 FINDINGS: There is stable right-sided venous Port-A-Cath positioning. The heart size and mediastinal contours are within normal limits. Moderate severity left basilar atelectasis and/or infiltrate is seen. Left suprahilar scarring and/or atelectasis is also noted. Multifocal areas of pleural thickening are present within the left lung base. A small left pleural effusion is seen. No pneumothorax is identified. The visualized skeletal structures are unremarkable. IMPRESSION: 1. Moderate severity left basilar atelectasis and/or infiltrate with stable areas of left basilar pleural masses. 2. Left suprahilar scarring and/or atelectasis. 3. Small left pleural effusion. Electronically Signed   By: Suzen Dials M.D.   On: 12/18/2023 17:29   Assessment/Plan Christopher Henson is a  very unfortunate 43 y.o. 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  Metastatic neuroendocrine carcinoma-under the care of Dr. Timmy.  He has chronic chest and flank discomfort, as well as abdominal discomfort due to known carcinoma with CNS and hepatic metastatic disease. -Observation admission -Pain control with OxyContin , IV Dilaudid  for breakthrough pain -Imaging today unfortunately shows disease progression in a short time span, Dr. Timmy added to inpatient treatment team -Given rapid progression of his disease, as well as chronic symptoms, will request inpatient palliative care consultation for symptom management and discussion of goals of care  Acute kidney injury-in the setting of baseline normal renal function, likely due to disease  progression and poor p.o. intake, though patient states that he has been eating and drinking -Hydrate gently with normal saline -Avoid nephrotoxins and renally dose medications -Monitor renal function with daily labs  HIV disease-continue Biktarvy , and Bactrim  for PCP prophylaxis  Odontogenic abscess-status post tooth extraction with OMFS at [facility] -Continue Augmentin  and doxycycline  through 9/17 -Plan outpatient ID follow-up with Dr. Overton  DVT prophylaxis: SCDs only due to chronic thrombocytopenia    Code Status: Full Code  Consults called: Dr. Ennever added to inpatient treatment team, palliative care also consulted as above.  Admission status: Observation  Time spent: 53 minutes  Christopher Dever CHRISTELLA Gail MD [facility] Pager [phone]  If 7PM-7AM, please contact night-coverage [email or web address] Password [email or web address]  12/19/2023, 12:21 PM

## 2023-12-19 NOTE — ED Provider Notes (Signed)
 [place] EMERGENCY DEPARTMENT AT [facility] Provider Note   CSN: [id number] Arrival date & time: 12/19/23  9390     Patient presents with: Chest Pain   Christopher Henson is a 43 y.o. male who is primary concern coming to the ED today was worsening chest discomfort and shortness of breath over the last 3 days.  It is noted that this patient has a history of lung cancer, undergoing radiation therapy, and states that his abdomen has been swelling which has been increasing his shortness of breath.  He was previously at [facility], workup undertaken at triage, left without being seen due to wait time.  Labs then did not show a leukocytosis, and does have a increased creatinine of 1.37.  Imaging obtained did show bibasilar atelectasis with left pleural effusion.  He also has decreased appetite secondary to his chronic conditions and worsening shortness of breath.  Troponin obtained at previous facility did not show any acute elevations.  EKG obtained at that time was unremarkable.  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    Chest Pain Associated symptoms: fatigue and shortness of breath        Prior to Admission medications   Medication Sig Start Date End Date Taking? Authorizing Provider  acetaminophen  (TYLENOL ) 325 MG tablet Take 2 tablets (650 mg total) by mouth every 6 (six) hours as needed. 01/17/23   Ghimire, Kuber, MD  Ampicillin -Sulbactam 3 g in sodium chloride  0.9 % 100 mL Inject 3 g into the vein every 6 (six) hours. 12/10/23   Laurence Locus, DO  bictegravir-emtricitabine -tenofovir  AF (BIKTARVY ) 50-200-25 MG TABS tablet Take 1 tablet by mouth daily. 11/14/23   Vu, Constance T, MD  brimonidine  (ALPHAGAN ) 0.2 % ophthalmic solution Place 1 drop into the right eye 2 (two) times daily.  11/05/23     dexamethasone  (DECADRON ) 4 MG tablet Take 3 tablets (12 mg total) by mouth daily. 12/10/23   Laurence Locus, DO  folic acid  (FOLVITE ) 1 MG tablet Take 2 tablets (2 mg total) by mouth daily. 10/04/23   Timmy Maude SAUNDERS, MD  HYDROmorphone  (DILAUDID ) 1 MG/ML injection Inject 0.5 mLs (0.5 mg total) into the vein every 3 (three) hours as needed (breakthrough pain not relieved by po oxycodone .). 12/10/23   Laurence Locus, DO  LORazepam  (ATIVAN ) 1 MG tablet Take 1 tablet (1 mg total) by mouth every 8 (eight) hours. Take 1 hour prior to radiation treatment, do not drive after taking med Patient not taking: No sig reported 11/20/23   Shannon Agent, MD  megestrol  (MEGACE  ES) 625 MG/5ML suspension Take 5 mLs (625 mg total) by mouth daily. Patient not taking: Reported on 12/10/2023 11/14/23   Timmy Maude SAUNDERS, MD  oxyCODONE  (OXYCONTIN ) 15 mg 12 hr tablet Take 1 tablet (15 mg total) by mouth every 8 (eight) hours. 12/07/23   Timmy Maude SAUNDERS, MD  Oxycodone  HCl 10 MG TABS Take 1 tablet (10 mg total) by mouth every 4 (four) hours as needed. 12/07/23   Timmy Maude SAUNDERS, MD  OXYGEN  Inhale 2 L into the lungs daily as needed.    [provider]  prednisoLONE  acetate (PRED FORTE ) 1 % ophthalmic suspension Instill 1 (one) drop into RIGHT eye every hour while awake today, then every 2 (two) hours while awake tomorrow and thereafter (e.g. 6-7 times per day) 10/31/23  senna-docusate (SENOKOT-S) 8.6-50 MG tablet Take 1 tablet by mouth 2 (two) times daily between meals as needed for mild constipation. 01/03/23   Samtani, Jai-Gurmukh, MD    Allergies: Patient has no known allergies.    Review of Systems  Constitutional:  Positive for appetite change and fatigue.  Respiratory:  Positive for shortness of breath.   Cardiovascular:  Positive for chest pain.  Gastrointestinal:  Positive for abdominal distention.  All other systems reviewed and are negative.   Updated Vital Signs BP (!) 124/98   Pulse 84   Temp (!)  97.5 F (36.4 C) (Oral)   Resp 16   SpO2 97%   Physical Exam Vitals and nursing note reviewed.  Constitutional:      General: He is not in acute distress.    Appearance: Normal appearance.  HENT:     Head: Normocephalic and atraumatic.     Mouth/Throat:     Mouth: Mucous membranes are moist.     Pharynx: Oropharynx is clear.  Eyes:     Extraocular Movements: Extraocular movements intact.     Conjunctiva/sclera: Conjunctivae normal.     Pupils: Pupils are equal, round, and reactive to light.  Cardiovascular:     Rate and Rhythm: Normal rate and regular rhythm.     Pulses: Normal pulses.     Heart sounds: Normal heart sounds. No murmur heard.    No friction rub. No gallop.  Pulmonary:     Effort: Pulmonary effort is normal.     Breath sounds: Examination of the left-lower field reveals decreased breath sounds. Decreased breath sounds present.  Abdominal:     General: Abdomen is protuberant. Bowel sounds are normal. There is distension.     Palpations: Abdomen is rigid. There is fluid wave and hepatomegaly.     Tenderness: There is abdominal tenderness in the right upper quadrant, right lower quadrant and left lower quadrant.  Musculoskeletal:        General: Normal range of motion.     Cervical back: Normal range of motion and neck supple.     Right lower leg: No edema.     Left lower leg: No edema.  Skin:    General: Skin is warm and dry.     Capillary Refill: Capillary refill takes less than 2 seconds.  Neurological:     General: No focal deficit present.     Mental Status: He is alert and oriented to person, place, and time. Mental status is at baseline.     GCS: GCS eye subscore is 4. GCS verbal subscore is 5. GCS motor subscore is 6.  Psychiatric:        Mood and Affect: Mood normal.     (all labs ordered are listed, but only abnormal results are displayed) Labs Reviewed  HEPATIC FUNCTION PANEL  TROPONIN T, HIGH SENSITIVITY    EKG: EKG  Interpretation Date/Time:  Wednesday December 19 2023 06:17:30 EDT Ventricular Rate:  100 PR Interval:  100 QRS Duration:  101 QT Interval:  363 QTC Calculation: 469 R Axis:   50  Text Interpretation: Sinus tachycardia Nonspecific repol abnormality, diffuse leads Confirmed by Theadore Sharper [phone]) on 12/19/2023 6:29:02 AM  Radiology: CT Angio Chest PE W/Cm &/Or Wo Cm Result Date: 12/19/2023 CLINICAL DATA:  Dyspnea, chronic, chest wall or pleura disease suspected. History of lung cancer. * Tracking Code: BO * EXAM: CT ANGIOGRAPHY CHEST WITH CONTRAST TECHNIQUE: Multidetector CT imaging of the chest was performed using the standard protocol during bolus  administration of intravenous contrast. Multiplanar CT image reconstructions and MIPs were obtained to evaluate the vascular anatomy. RADIATION DOSE REDUCTION: This exam was performed according to the departmental dose-optimization program which includes automated exposure control, adjustment of the mA and/or kV according to patient size and/or use of iterative reconstruction technique. CONTRAST:  75mL OMNIPAQUE  IOHEXOL  350 MG/ML SOLN COMPARISON:  CT angiography chest from 12/10/2023 and 11/24/2023. FINDINGS: Cardiovascular: No evidence of embolism to the proximal subsegmental pulmonary artery level. Normal cardiac size. No pericardial effusion. No aortic aneurysm. There are coronary artery calcifications, in keeping with coronary artery disease. Mediastinum/Nodes: Visualized thyroid  gland appears grossly unremarkable. There are multiple mildly hyperattenuating masses abutting the mediastinal pleura with largest along the left upper anteromedial hemithorax measuring up to 3.9 x 5.1. These are highly concerning for pleural metastases. The esophagus is nondistended precluding optimal assessment. There are multiple enlarged mediastinal, bilateral hilar, left lower cervical and left axillary lymph nodes with largest in the left axilla measuring up to 2.5 x 3.9  cm, compatible with metastasis. When compared to the prior study, there is interval increase in the left axillary lymph node. Crohn's also appear slightly enlarged when compared to the prior exam, favoring worsening metastatic. No right axillary lymphadenopathy by size criteria. Lungs/Pleura: The central tracheo-bronchial tree is patent. Redemonstration of moderate irregular nodular pleural thickening especially along the left lower hemithorax, compatible pleural metastases. There also multiple solid noncalcified nodules mainly in the right lung upper lobe largest measuring up to 3 x 4 mm. The pre-existing nodules appears grossly unchanged however, there are several new 1-2 mm nodules (for example, series 12, images 30 and 31), compatible with worsening metastases. No pleural effusion, lung mass or pneumothorax. There are multiple solid noncalcified opacities along the left major fissure, similar to prior study which may represent intra fissural lymph node versus pleural metastasis. No significant interval change. Upper Abdomen: There is pseudo cirrhotic appearance of the liver due to multiple underlying ill-defined hypoattenuating masses, compatible with metastasis. The masses are not well seen and therefore comparison with prior imaging is not performed. There also enlarged left retrocrural nodal mass (series 4, image 117), similar to the prior study. There is markedly enlarged left adrenal gland, compatible with metastasis. No significant interval change. There also multiple peripancreatic lymph nodes, metastatic as well. Remaining visualized upper abdominal viscera within normal limits. Musculoskeletal: A CT Port-a-Cath is seen in the right upper chest wall with the catheter terminating in the cavo-atrial junction region. Visualized soft tissues of the chest wall are otherwise grossly unremarkable. Redemonstration of sclerotic lesions in the sternum, compatible with metastases. No pathological fracture. There  are mild multilevel degenerative changes in the visualized spine. Review of the MIP images confirms the above findings. IMPRESSION: 1. No embolism to the proximal subsegmental pulmonary artery level. 2. Redemonstration of extensive metastatic disease involving the pleura, mediastinal/hilar lymph nodes, left lower cervical and left axillary lymph nodes, liver, left adrenal gland, peripancreatic lymph nodes, left retrocrural lymph nodes and bones. There is interval increase in the size of left axillary lymph node as well as several new right lung nodules, compatible with worsening metastatic disease. 3. Multiple other nonacute observations, as described above. Electronically Signed   By: Ree Molt M.D.   On: 12/19/2023 10:26   DG Chest 2 View Result Date: 12/18/2023 CLINICAL DATA:  Chest pain and shortness of breath. EXAM: CHEST - 2 VIEW COMPARISON:  December 09, 2023 FINDINGS: There is stable right-sided venous Port-A-Cath positioning. The heart size and mediastinal  contours are within normal limits. Moderate severity left basilar atelectasis and/or infiltrate is seen. Left suprahilar scarring and/or atelectasis is also noted. Multifocal areas of pleural thickening are present within the left lung base. A small left pleural effusion is seen. No pneumothorax is identified. The visualized skeletal structures are unremarkable. IMPRESSION: 1. Moderate severity left basilar atelectasis and/or infiltrate with stable areas of left basilar pleural masses. 2. Left suprahilar scarring and/or atelectasis. 3. Small left pleural effusion. Electronically Signed   By: Suzen Dials M.D.   On: 12/18/2023 17:29     Procedures   Medications Ordered in the ED  iohexol  (OMNIPAQUE ) 350 MG/ML injection 75 mL (75 mLs Intravenous Contrast Given 12/19/23 0945)                                    Medical Decision Making Amount and/or Complexity of Data Reviewed Labs: ordered. Radiology: ordered.  Risk Prescription  drug management. Decision regarding hospitalization.   Medical Decision Making:   Willett Lefeber is a 43 y.o. male who presented to the ED today with increasing dyspnea on chest pain over 3 days detailed above.    External chart has been reviewed including previous labs, imaging, oncology records. Patient's presentation is complicated by their history of metastatic lung disease, HIV.  Patient placed on continuous vitals and telemetry monitoring while in ED which was reviewed periodically.  Complete initial physical exam performed, notably the patient  was alert and oriented no apparent distress, noted increased edema to the abdomen with tenderness over the right upper quadrant and firm enlarged liver.    Reviewed and confirmed nursing documentation for past medical history, family history, social history.    Initial Assessment:   With the patient's presentation of increased shortness of breath as well as chest discomfort, most likely secondary to progression of his metastatic lung disease.  He further has known history of metastasis to the liver, consider ascites secondary to acute liver failure.  With acute chest pain and dyspnea also consider possibility of acute pulmonary embolus.   Initial Plan:  Obtain CTA of the chest to exclude diagnosis of acute PE. Screening labs including CBC and Metabolic panel to evaluate for infectious or metabolic etiology of disease.  These were obtained at [facility] prior to his arrival to the ED today.  Hepatic panel was BMP was obtained at previous facility. Urinalysis with reflex culture ordered to evaluate for UTI or relevant urologic/nephrologic pathology.  CXR to evaluate for structural/infectious intrathoracic pathology.  Obtained at previous facility. EKG to evaluate for cardiac pathology Objective evaluation as below reviewed   Initial Study Results:   Laboratory  All laboratory results reviewed without evidence of clinically relevant  pathology.   Exceptions include: Creatinine is 1.37.  Hepatic enzymes have not been drawn prior to admission.  EKG EKG was reviewed independently. Rate, rhythm, axis, intervals all examined and without medically relevant abnormality. ST segments without concerns for elevations.    Radiology:  All images reviewed independently. Agree with radiology report at this time.   CT Angio Chest PE W/Cm &/Or Wo Cm Result Date: 12/19/2023 CLINICAL DATA:  Dyspnea, chronic, chest wall or pleura disease suspected. History of lung cancer. * Tracking Code: BO * EXAM: CT ANGIOGRAPHY CHEST WITH CONTRAST TECHNIQUE: Multidetector CT imaging of the chest was performed using the standard protocol during bolus administration of intravenous contrast. Multiplanar CT image reconstructions and MIPs were obtained  to evaluate the vascular anatomy. RADIATION DOSE REDUCTION: This exam was performed according to the departmental dose-optimization program which includes automated exposure control, adjustment of the mA and/or kV according to patient size and/or use of iterative reconstruction technique. CONTRAST:  75mL OMNIPAQUE  IOHEXOL  350 MG/ML SOLN COMPARISON:  CT angiography chest from 12/10/2023 and 11/24/2023. FINDINGS: Cardiovascular: No evidence of embolism to the proximal subsegmental pulmonary artery level. Normal cardiac size. No pericardial effusion. No aortic aneurysm. There are coronary artery calcifications, in keeping with coronary artery disease. Mediastinum/Nodes: Visualized thyroid  gland appears grossly unremarkable. There are multiple mildly hyperattenuating masses abutting the mediastinal pleura with largest along the left upper anteromedial hemithorax measuring up to 3.9 x 5.1. These are highly concerning for pleural metastases. The esophagus is nondistended precluding optimal assessment. There are multiple enlarged mediastinal, bilateral hilar, left lower cervical and left axillary lymph nodes with largest in the left  axilla measuring up to 2.5 x 3.9 cm, compatible with metastasis. When compared to the prior study, there is interval increase in the left axillary lymph node. Crohn's also appear slightly enlarged when compared to the prior exam, favoring worsening metastatic. No right axillary lymphadenopathy by size criteria. Lungs/Pleura: The central tracheo-bronchial tree is patent. Redemonstration of moderate irregular nodular pleural thickening especially along the left lower hemithorax, compatible pleural metastases. There also multiple solid noncalcified nodules mainly in the right lung upper lobe largest measuring up to 3 x 4 mm. The pre-existing nodules appears grossly unchanged however, there are several new 1-2 mm nodules (for example, series 12, images 30 and 31), compatible with worsening metastases. No pleural effusion, lung mass or pneumothorax. There are multiple solid noncalcified opacities along the left major fissure, similar to prior study which may represent intra fissural lymph node versus pleural metastasis. No significant interval change. Upper Abdomen: There is pseudo cirrhotic appearance of the liver due to multiple underlying ill-defined hypoattenuating masses, compatible with metastasis. The masses are not well seen and therefore comparison with prior imaging is not performed. There also enlarged left retrocrural nodal mass (series 4, image 117), similar to the prior study. There is markedly enlarged left adrenal gland, compatible with metastasis. No significant interval change. There also multiple peripancreatic lymph nodes, metastatic as well. Remaining visualized upper abdominal viscera within normal limits. Musculoskeletal: A CT Port-a-Cath is seen in the right upper chest wall with the catheter terminating in the cavo-atrial junction region. Visualized soft tissues of the chest wall are otherwise grossly unremarkable. Redemonstration of sclerotic lesions in the sternum, compatible with metastases.  No pathological fracture. There are mild multilevel degenerative changes in the visualized spine. Review of the MIP images confirms the above findings. IMPRESSION: 1. No embolism to the proximal subsegmental pulmonary artery level. 2. Redemonstration of extensive metastatic disease involving the pleura, mediastinal/hilar lymph nodes, left lower cervical and left axillary lymph nodes, liver, left adrenal gland, peripancreatic lymph nodes, left retrocrural lymph nodes and bones. There is interval increase in the size of left axillary lymph node as well as several new right lung nodules, compatible with worsening metastatic disease. 3. Multiple other nonacute observations, as described above. Electronically Signed   By: Ree Molt M.D.   On: 12/19/2023 10:26   DG Chest 2 View Result Date: 12/18/2023 CLINICAL DATA:  Chest pain and shortness of breath. EXAM: CHEST - 2 VIEW COMPARISON:  December 09, 2023 FINDINGS: There is stable right-sided venous Port-A-Cath positioning. The heart size and mediastinal contours are within normal limits. Moderate severity left basilar atelectasis and/or infiltrate  is seen. Left suprahilar scarring and/or atelectasis is also noted. Multifocal areas of pleural thickening are present within the left lung base. A small left pleural effusion is seen. No pneumothorax is identified. The visualized skeletal structures are unremarkable. IMPRESSION: 1. Moderate severity left basilar atelectasis and/or infiltrate with stable areas of left basilar pleural masses. 2. Left suprahilar scarring and/or atelectasis. 3. Small left pleural effusion. Electronically Signed   By: Suzen Dials M.D.   On: 12/18/2023 17:29   CT Angio Chest Pulmonary Embolism (PE) W or WO Contrast Result Date: 12/10/2023 CLINICAL DATA:  Severe chest pain. History of lung cancer. * Tracking Code: BO * EXAM: CT ANGIOGRAPHY CHEST WITH CONTRAST TECHNIQUE: Multidetector CT imaging of the chest was performed using the standard  protocol during bolus administration of intravenous contrast. Multiplanar CT image reconstructions and MIPs were obtained to evaluate the vascular anatomy. RADIATION DOSE REDUCTION: This exam was performed according to the departmental dose-optimization program which includes automated exposure control, adjustment of the mA and/or kV according to patient size and/or use of iterative reconstruction technique. CONTRAST:  75mL OMNIPAQUE  IOHEXOL  350 MG/ML SOLN COMPARISON:  11/24/2023 FINDINGS: Cardiovascular: The heart size is normal. No substantial pericardial effusion. Trace pericardial effusion evident. Coronary artery calcification is evident. No thoracic aortic aneurysm. No substantial atherosclerotic calcification of the thoracic aorta. There is no filling defect within the opacified pulmonary arteries to suggest the presence of an acute pulmonary embolus. Mediastinum/Nodes: Pre-vascular mass measured previously at 5.2 x 3.8 cm is 5.2 x 4.1 cm today on 58/5. Lesion posterior to the aortic arch measured previously at 1.8 x 1.4 is 1.9 x 1.9 cm today on 54/5. Subcarinal lymph node measured previously at 1.9 cm short axis is 2.4 cm short axis today on 89/5. Right hilar lymphadenopathy again noted. Similar left axillary lymphadenopathy given differential arm positioning. Loculated pleural fluid in the right hemithorax is similar. Lungs/Pleura: Similar left apical scarring. Collapse/consolidation in the left lung base is similar. Scattered tiny pulmonary nodules bilaterally. Nodules in the right apex appear progressive in the interval (see 6 mm right paraspinal nodule on 31/7. As above, there is loculated pleural fluid in the left hemithorax with nodular character in some regions suggesting underlying pleural nodularity. Upper Abdomen: Multiple liver metastases again noted. 3 cm lesion posterior left hepatic lobe on 129/5 was 2.4 cm previously (remeasured). 3.7 cm lesion lateral right hepatic dome was 2.2 cm previously  remeasured. Large left adrenal metastasis evident. Upper abdominal lymphadenopathy is similar. Musculoskeletal: Sclerotic lesions are seen in the sternum and upper lumbar spine consistent with metastatic disease. Review of the MIP images confirms the above findings. IMPRESSION: 1. No CT evidence for acute pulmonary embolus. 2. Similar appearance of loculated pleural fluid in the left hemithorax with nodular character in some regions suggesting underlying pleural nodularity. 3. Mediastinal, left axillary, and upper abdominal lymphadenopathy. 4. Interval progression of hepatic metastatic disease. 5. Large left adrenal metastasis. 6. Similar appearance of sclerotic lesions in the sternum with sclerotic lesions in the upper lumbar spine consistent with metastatic disease but not included in the field of view for the previous chest CT. 7. Scattered tiny pulmonary nodules bilaterally. Nodules in the right apex appear progressive in the interval. Electronically Signed   By: Camellia Candle M.D.   On: 12/10/2023 05:10   CT Maxillofacial W Contrast Result Date: 12/09/2023 CLINICAL DATA:  Initial evaluation for acute left facial swelling. EXAM: CT MAXILLOFACIAL WITH CONTRAST TECHNIQUE: Multidetector CT imaging of the maxillofacial structures was performed with intravenous  contrast. Multiplanar CT image reconstructions were also generated. RADIATION DOSE REDUCTION: This exam was performed according to the departmental dose-optimization program which includes automated exposure control, adjustment of the mA and/or kV according to patient size and/or use of iterative reconstruction technique. CONTRAST:  75mL OMNIPAQUE  IOHEXOL  350 MG/ML SOLN COMPARISON:  None Available. FINDINGS: Osseous: No acute osseous abnormality. No discrete or worrisome osseous lesions. Orbits: Globes orbital soft tissues within normal limits. Sinuses: Mild chronic mucoperiosteal thickening present about the ethmoidal air cells and maxillary sinuses.  Paranasal sinuses are otherwise largely clear. Mastoid air cells and middle ear cavities are well pneumatized and free of fluid. Soft tissues: Soft tissue swelling with inflammatory stranding seen involving the soft tissues of the left lower face, concerning for acute infection/cellulitis. Poor dentition with innumerable dental caries, suggesting an odontogenic origin. Superimposed rim enhancing hypodense collection along the buccal aspect of the left mandibular body measures 3.6 x 1.8 x 3.6 cm, consistent with odontogenic abscess (series 3, image 22). No extension of infection into the deeper spaces of the neck at this time. Limited intracranial: No acute finding. IMPRESSION: 1. Soft tissue swelling with inflammatory stranding involving the soft tissues of the left lower face, concerning for acute infection/cellulitis. Superimposed 3.6 x 1.8 x 3.6 cm odontogenic abscess along the buccal aspect of the left mandibular body as above. 2. Poor dentition with innumerable dental caries. Electronically Signed   By: Morene Hoard M.D.   On: 12/09/2023 21:51   DG Chest 2 View Result Date: 12/09/2023 CLINICAL DATA:  Chest pain EXAM: CHEST - 2 VIEW COMPARISON:  12/07/2023, CT 11/24/2023 FINDINGS: Multiple nodular pleural base masses and associated small pleural effusion are again seen within the left hemithorax with associated marked asymmetric left-sided volume loss. Since prior examination, the degree of volume loss has progressed. Right lung is clear. No pneumothorax. No pleural effusion on the right. Right internal jugular chest port tip seen at the superior cavoatrial junction. Cardiac size is at the upper limits of normal, stable. Pulmonary vascularity is normal. No acute bone abnormality. IMPRESSION: 1. Progressive left-sided volume loss. 2. 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 Electronically Signed    By: Dorethia Molt M.D.   On: 12/09/2023 19:41   DG Chest 2 View Result Date: 12/07/2023 CLINICAL DATA:  43 year old male with left side chest pain. Pleural effusion. Non-small cell lung cancer. EXAM: CHEST - 2 VIEW COMPARISON:  CTA chest 11/24/2023 and earlier. FINDINGS: PA and lateral views of the chest 0915 hours. Stable right chest power port. Stable abnormal mediastinal contour reflecting mediastinal lymphadenopathy, and superimposed abnormal pleural spaces and lung base on the left, not significantly changed. No pneumothorax. No pulmonary edema. No areas of worsening ventilation. Stable visualized osseous structures.  Negative visible bowel gas. IMPRESSION: Stable radiographic appearance of lung cancer since 11/24/2023. No new cardiopulmonary abnormality identified. Electronically Signed   By: VEAR Hurst M.D.   On: 12/07/2023 09:35   CT ABDOMEN PELVIS W CONTRAST Result Date: 11/24/2023 CLINICAL DATA:  Left side abdominal pain EXAM: CT ABDOMEN AND PELVIS WITH CONTRAST TECHNIQUE: Multidetector CT imaging of the abdomen and pelvis was performed using the standard protocol following bolus administration of intravenous contrast. RADIATION DOSE REDUCTION: This exam was performed according to the departmental dose-optimization program which includes automated exposure control, adjustment of the mA and/or kV according to patient size and/or use of iterative reconstruction technique. CONTRAST:  75mL OMNIPAQUE  IOHEXOL  350 MG/ML SOLN  COMPARISON:  08/02/2023 FINDINGS: Lower chest: See chest CT report today. Hepatobiliary: Numerous new hepatic masses compatible with metastases. The largest is in left hepatic lobe measuring 5.8 x 4.6 cm. Numerous other new metastases noted throughout the liver. Gallbladder unremarkable. Pancreas: No focal abnormality or ductal dilatation. Spleen: No focal abnormality.  Normal size. Adrenals/Urinary Tract: Large left adrenal mass measures up to 4.2 cm compared to 1.3 cm previously. No  renal mass or hydronephrosis. Urinary bladder unremarkable. Stomach/Bowel: Normal appendix. Stomach, large and small bowel grossly unremarkable. Vascular/Lymphatic: Upper abdominal adenopathy in the celiac region. Retroperitoneal adenopathy, both new since prior study. No evidence of aortic aneurysm. Reproductive: No visible focal abnormality. Other: No free fluid or free air. Musculoskeletal: Sclerotic lesions in the L1 and L2 vertebral body are stable. No acute bony abnormality. IMPRESSION: Several new hepatic metastases measuring up to 5.8 cm in the left hepatic lobe. Enlarging left adrenal metastasis. New celiac axis and retroperitoneal adenopathy. Electronically Signed   By: Franky Crease M.D.   On: 11/24/2023 20:34   CT Angio Chest PE W and/or Wo Contrast Result Date: 11/24/2023 CLINICAL DATA:  Pleuritic chest pain.  Non-small cell lung cancer. EXAM: CT ANGIOGRAPHY CHEST WITH CONTRAST TECHNIQUE: Multidetector CT imaging of the chest was performed using the standard protocol during bolus administration of intravenous contrast. Multiplanar CT image reconstructions and MIPs were obtained to evaluate the vascular anatomy. RADIATION DOSE REDUCTION: This exam was performed according to the departmental dose-optimization program which includes automated exposure control, adjustment of the mA and/or kV according to patient size and/or use of iterative reconstruction technique. CONTRAST:  75mL OMNIPAQUE  IOHEXOL  350 MG/ML SOLN COMPARISON:  08/02/2023 FINDINGS: Cardiovascular: Heart is normal size. Aorta is normal caliber. No filling defects in the pulmonary arteries to suggest pulmonary emboli. Mediastinum/Nodes: Large prevascular nodal mass measures 5.2 x 3.8 cm, similar to prior study. Abnormal soft tissue posterior to the proximal descending thoracic aorta measures 1.8 x 1.4 cm, new since prior study. Soft tissue nodule adjacent to the distal descending thoracic aorta on image 125 of series 3 measures 2.8 x 2.4 cm  and could be mediastinal or pleural based. Right hilar lymph node has a short axis diameter of 1.1 cm, stable. Subcarinal lymph node has a short axis diameter of 1.9 cm, new since prior study. Left axillary adenopathy measures 1.8 cm in short axis diameter, new since prior study. Trachea and thyroid  unremarkable. Lungs/Pleura: Small loculated left pleural effusion. Pleural thickening, loculated effusion, or pleural mass anteriorly at the left lung base, increasing since prior study. Pleural nodularity noted at the left lung base, worsening since prior study. No confluent opacities, effusions or nodules on the right. Upper Abdomen: See abdominal CT report today. Musculoskeletal: Sclerotic area within the mid sternum is similar to prior study. Review of the MIP images confirms the above findings. IMPRESSION: No evidence of pulmonary embolus. Large left pre-vascular mass is similar to prior study. However, there is worsening subcarinal adenopathy and left axillary adenopathy. In addition, there appears to be worsening pleural disease in the left lung with increasing lower nodularity at the left lung base and soft tissue nodules posterior to the proximal descending thoracic aorta and distal descending thoracic aorta which could be mediastinal adenopathy or pleural nodules. Electronically Signed   By: Franky Crease M.D.   On: 11/24/2023 20:30   DG Chest 2 View Result Date: 11/24/2023 CLINICAL DATA:  Chest pain Non-small cell lung cancer (NSCLC), metastatic, EXAM: CHEST - 2 VIEW COMPARISON:  Chest x-ray 11/02/2023,  CT chest 08/02/2023 FINDINGS: Right chest wall Port-A-Cath with tip overlying the expected region of the superior cavoatrial junction. The heart and mediastinal contours are unchanged. Redemonstration of left upper lobe perihilar airspace opacity that is better evaluated on CT chest 11/02/2023. No focal consolidation. No pulmonary edema. Interval increase in size of a small to moderate volume loculated left  pleural effusion. No right pleural effusion. No pneumothorax. No acute osseous abnormality. IMPRESSION: Interval increase in size of a small to moderate volume loculated left pleural effusion. Electronically Signed   By: Morgane  Naveau M.D.   On: 11/24/2023 15:09      Consults: Case discussed with hospitalist team.   Reassessment and Plan:   I reviewed this patient's physical exam along with the objective data obtained, seems that this is secondary to worsening of his metastatic disease.  He was due for radiation therapy today at 11:00 however declined as he is feels that he is unable to due to because of the worsening shortness of breath and chest discomfort.  Reviewed the CT scan which did not demonstrate acute PE, however did show worsening of previously noted metastatic disease.  Discussed with patient options for continued management, including option to follow-up with oncology as an outpatient.  At this time he opts for inpatient admission with oncology team following him in the hospital.  Find this is reasonable secondary to his worsening condition over a short interval period.  Consult with hospitalist team for admission at this time for continued monitoring of his condition, consultation with oncology, and continuing management of his chronic condition.       Final diagnoses:  Metastatic lung carcinoma, unspecified laterality (HCC)  Dyspnea, unspecified type    ED Discharge Orders     None          Myriam Dorn BROCKS, PA 12/19/23 1201    Emil Share, DO 12/19/23 1213

## 2023-12-19 NOTE — ED Notes (Signed)
 Patient left.

## 2023-12-19 NOTE — Consult Note (Signed)
 Consultation Note Date: 12/19/2023   Patient Name: Christopher Henson  DOB: 1980-04-18  MRN: [id number]  Age / Sex: 43 y.o., male  PCP: Christopher Alm RAMAN, PA-C Referring Physician: Zella Katha HERO, MD  Reason for Consultation:  chronic pain related to metastatic cancer, may also benefit from goals of care discussion given progression of disease despite ongoing treatment  HPI/Patient Profile: 43 y.o. male  with past medical history of lung cancer with mets to brain, liver, L adrenal gland, bones (s/p SBRT, currently on Avastin /Alimta /Zometa ), HIV positive, recent hospitalization at [facility] for dental abscess and tooth abstractions, admitted on 12/19/2023 with complaints of chest pain. CTA showed progression of cancer. Palliative medicine consulted for symptom management and goals of care.   Primary Decision Maker [name] his preferred surrogate decision maker would be his sister in law Christopher Henson- he does not have any Advance Directive documents  Discussion: Chart reviewed including labs, progress notes, imaging from this and previous encounters.  Imaging reviewed- CTA shows progression of cancer with new lung nodules.  On evaluation [name] is oriented x 3. He has a flat affect and speaks quietly. He reports pain in his L chest. Pain is not a new pain- but has gotten more difficult to manage with his home OxyContin  15 mg TID and oxycodone  IR 10 mg q4 hours.  He is also dizzy. He denies nausea.  He feels earlier dose of hydromorphone  IV relieved pain but was only temporary. I remained at the bedside as patient received another dose of hydromorphone  1mg . He reported relief.  He lives in [place] with his sister in Social worker. He is independent with ADLs. No family at bedside.     SUMMARY OF RECOMMENDATIONS -Lung cancer metastatic to bone, liver, brain, adrenal- CT shows progression- consult from Oncology pending- will  defer GOC discussion until he is seen by Oncology and his pain is better controlled -Cancer related pain- IV hydromorphone  1-2mg  q1hr prn - will calculate 24 hr OME and adjust opioids accordingly, start acetaminophen  650mg  po TID -Senna 1 po at bedtime for bowel prophylaxis    Code Status/Advance Care Planning:   Code Status: Full Code    Prognosis:   Unable to determine  Discharge Planning: To Be Determined  Primary Diagnoses: Present on Admission:  AKI (acute kidney injury) (HCC)   Review of Systems  Gastrointestinal:  Negative for nausea.  Neurological:  Positive for dizziness.  Psychiatric/Behavioral:  The patient is nervous/anxious.     Physical Exam Vitals and nursing note reviewed.  Constitutional:      Appearance: He is ill-appearing.  Neurological:     Mental Status: He is alert and oriented to person, place, and time.     Vital Signs: BP (!) 143/109   Pulse (!) 101   Temp 97.6 F (36.4 C) (Oral)   Resp 15   SpO2 92%  Pain Scale: 0-10   Pain Score: 8    SpO2: SpO2: 92 % O2 Device:SpO2: 92 % O2 Flow Rate: .   IO: Intake/output summary: No  intake or output data in the 24 hours ending 12/19/23 1523  LBM:   Baseline Weight:   Most recent weight:         Thank you for this consult. Palliative medicine will continue to follow and assist as needed.   Signed by: Christopher Henson, AGNP-C Palliative Medicine  Time includes:   Preparing to see the patient (e.g., review of tests) Obtaining and/or reviewing separately obtained history Performing a medically necessary appropriate examination and/or evaluation Counseling and educating the patient/family/caregiver Ordering medications, tests, or procedures Referring and communicating with other health care professionals (when not reported separately) Documenting clinical information in the electronic or other health record Independently interpreting results (not reported separately) and communicating results  to the patient/family/caregiver Care coordination (not reported separately) Clinical documentation   Please contact Palliative Medicine Team phone at [phone] for questions and concerns.  For individual provider: See Tracey

## 2023-12-19 NOTE — ED Notes (Signed)
 Pt approached RN about wait. RN explained to pt ED staff are working hard to see pt. Pt frustrated that other people are being called before him. RN explained it based off acuity and will be working hard to get pt a room as soon as possible.

## 2023-12-19 NOTE — ED Notes (Signed)
Pt stepped outside.  

## 2023-12-19 NOTE — ED Triage Notes (Signed)
 Pt reports chest pain & SHOB x 3 days. Hx lung cancer. Pt reports pain radiates into back.

## 2023-12-20 ENCOUNTER — Ambulatory Visit

## 2023-12-20 DIAGNOSIS — G893 Neoplasm related pain (acute) (chronic): Secondary | ICD-10-CM

## 2023-12-20 DIAGNOSIS — Z7189 Other specified counseling: Secondary | ICD-10-CM

## 2023-12-20 DIAGNOSIS — B20 Human immunodeficiency virus [HIV] disease: Secondary | ICD-10-CM

## 2023-12-20 DIAGNOSIS — Z79899 Other long term (current) drug therapy: Secondary | ICD-10-CM

## 2023-12-20 DIAGNOSIS — Z515 Encounter for palliative care: Secondary | ICD-10-CM

## 2023-12-20 DIAGNOSIS — C7A1 Malignant poorly differentiated neuroendocrine tumors: Secondary | ICD-10-CM

## 2023-12-20 DIAGNOSIS — C7800 Secondary malignant neoplasm of unspecified lung: Secondary | ICD-10-CM | POA: Diagnosis not present

## 2023-12-20 DIAGNOSIS — K5903 Drug induced constipation: Secondary | ICD-10-CM | POA: Diagnosis not present

## 2023-12-20 DIAGNOSIS — F419 Anxiety disorder, unspecified: Secondary | ICD-10-CM

## 2023-12-20 DIAGNOSIS — K5900 Constipation, unspecified: Secondary | ICD-10-CM | POA: Diagnosis not present

## 2023-12-20 DIAGNOSIS — Z66 Do not resuscitate: Secondary | ICD-10-CM | POA: Diagnosis not present

## 2023-12-20 LAB — BASIC METABOLIC PANEL WITH GFR
Anion gap: 20 — ABNORMAL HIGH (ref 5–15)
BUN: 24 mg/dL — ABNORMAL HIGH (ref 6–20)
CO2: 18 mmol/L — ABNORMAL LOW (ref 22–32)
Calcium: 9.2 mg/dL (ref 8.9–10.3)
Chloride: 98 mmol/L (ref 98–111)
Creatinine, Ser: 1.09 mg/dL (ref 0.61–1.24)
GFR, Estimated: >60 mL/min (ref >60)
Glucose, Bld: 96 mg/dL (ref 70–99)
Potassium: 4.7 mmol/L (ref 3.5–5.1)
Sodium: 136 mmol/L (ref 135–145)

## 2023-12-20 LAB — CBC
HCT: 48 % (ref 39.0–52.0)
Hemoglobin: 15.3 g/dL (ref 13.0–17.0)
MCH: 32.3 pg (ref 26.0–34.0)
MCHC: 31.9 g/dL (ref 30.0–36.0)
MCV: 101.5 fL — ABNORMAL HIGH (ref 80.0–100.0)
Platelets: 70 K/uL — ABNORMAL LOW (ref 150–400)
RBC: 4.73 MIL/uL (ref 4.22–5.81)
RDW: 17.8 % — ABNORMAL HIGH (ref 11.5–15.5)
WBC: 9.1 K/uL (ref 4.0–10.5)
nRBC: 0.8 % — ABNORMAL HIGH (ref 0.0–0.2)

## 2023-12-20 MED ORDER — GABAPENTIN 100 MG PO CAPS
100.0000 mg | ORAL_CAPSULE | Freq: Every day | ORAL | Status: DC
  Administered 2023-12-20: 100 mg via ORAL
  Filled 2023-12-20: qty 1

## 2023-12-20 MED ORDER — OXYCODONE HCL ER 15 MG PO T12A
30.0000 mg | EXTENDED_RELEASE_TABLET | Freq: Two times a day (BID) | ORAL | Status: DC
  Administered 2023-12-20: 30 mg via ORAL
  Filled 2023-12-20: qty 2

## 2023-12-20 MED ORDER — HYDROMORPHONE HCL 1 MG/ML IJ SOLN
1.0000 mg | INTRAMUSCULAR | Status: DC | PRN
  Administered 2023-12-20: 1 mg via INTRAVENOUS
  Administered 2023-12-21 (×4): 2 mg via INTRAVENOUS
  Administered 2023-12-22: 1 mg via INTRAVENOUS
  Administered 2023-12-22 (×2): 2 mg via INTRAVENOUS
  Administered 2023-12-23: 1 mg via INTRAVENOUS
  Filled 2023-12-20 (×3): qty 2
  Filled 2023-12-20 (×2): qty 1
  Filled 2023-12-20 (×4): qty 2
  Filled 2023-12-20: qty 1

## 2023-12-20 MED ORDER — OXYCODONE HCL ER 20 MG PO T12A
20.0000 mg | EXTENDED_RELEASE_TABLET | Freq: Two times a day (BID) | ORAL | Status: DC
  Administered 2023-12-20 – 2023-12-22 (×5): 20 mg via ORAL
  Filled 2023-12-20 (×5): qty 1

## 2023-12-20 MED ORDER — LORAZEPAM 0.5 MG PO TABS
0.5000 mg | ORAL_TABLET | Freq: Four times a day (QID) | ORAL | Status: DC | PRN
  Administered 2023-12-20: 0.5 mg via ORAL
  Filled 2023-12-20: qty 1

## 2023-12-20 MED ORDER — OXYCODONE HCL 5 MG PO TABS: 15.0000 mg | ORAL_TABLET | ORAL | Status: DC | PRN

## 2023-12-20 MED ORDER — ENSURE PLUS HIGH PROTEIN PO LIQD
237.0000 mL | Freq: Two times a day (BID) | ORAL | Status: DC
  Administered 2023-12-20 – 2023-12-23 (×5): 237 mL via ORAL

## 2023-12-20 MED ORDER — HYDROMORPHONE HCL 2 MG PO TABS
2.0000 mg | ORAL_TABLET | ORAL | Status: DC | PRN
  Administered 2023-12-20: 2 mg via ORAL
  Filled 2023-12-20: qty 1

## 2023-12-20 MED ORDER — SENNA 8.6 MG PO TABS
1.0000 | ORAL_TABLET | Freq: Two times a day (BID) | ORAL | Status: DC
  Administered 2023-12-20 – 2023-12-21 (×2): 8.6 mg via ORAL
  Filled 2023-12-20 (×2): qty 1

## 2023-12-20 NOTE — Assessment & Plan Note (Addendum)
-   Continue Biktarvy  - Per [facility], CD4 count was 89 on 12/10/23 on their admission and he was started on Bactrim  for PJP prophylaxis as well

## 2023-12-20 NOTE — Assessment & Plan Note (Addendum)
-   Follows with Dr. Timmy - Has undergone treatment historically with radiosurgery and 2 lines of chemoimmunotherapy now with progressive disease on last CT - He is recommended for consideration of comfort measures and hospice at this time -Pain control was main focus on admission, see separate problem -Palliative care also consulted on admission

## 2023-12-20 NOTE — Assessment & Plan Note (Addendum)
-   Recent hospitalization at [facility] from 12/10/2023 until 12/14/2023 -Treated for a large left mandibular abscess.  Underwent extraction of root tips #19 and 20 along with I&D of the left mandibular vestibule with placement of Penrose drain.  He was discharged on Augmentin  and doxycycline  to complete total of 14-day course from source control on 12/12/2023 and outpatient follow-up with Dr. Overton with ID - Continued on Augmentin  and doxycycline ; course considered complete once having to transition to pure comfort

## 2023-12-20 NOTE — Assessment & Plan Note (Addendum)
-   Patient endorsing poor appetite and decreased intake recently - Creatinine 1.37 on admission and had improved; acutely worsened 2024-01-13

## 2023-12-20 NOTE — Progress Notes (Signed)
 Patient shared with us  today that he is not interested in finishing his radiation, and would like to proceed with Hospice care. He has received 8/10 radiation treatments to the brain. Dr. Shannon has reviewed his current work-up and agree that this is very reasonable. We will cancel the remainder of his radiation treatments.   Radiation follow-up PRN. We appreciate the opportunity to take part in this patient's care.     Christopher Due, PA-C

## 2023-12-20 NOTE — Hospital Course (Addendum)
 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  He has been treated with radiosurgery and failed 2 lines of chemoimmunotherapy per oncology.  He has had progression of disease despite treatments.  He continues to have uncontrolled cancer associated pain mostly involving his left chest wall area. Most recently he has also undergone hospitalization at [facility] on 12/10/2023 due to mandibular abscess requiring teeth extractions and washout of oral abscess.  He was discharged home on a course of Augmentin  and doxycycline  with outpatient follow-up with ID planned also. Other PMH includes HIV/AIDS, GERD.  He presented for this hospitalization with chest pain and shortness of breath.  He has also had poor appetite and intake recently.  Hospitalization directed for involving palliative care and pain control approach. He was started on pain control with palliative care assistance. Tentative plan was for discharging home with hospice however he had rapid decline while in the hospital and care was transitioned to comfort care approach inpatient with in-hospital death anticipated. Comfort was achieved and he continued to have ongoing rapid expected decline. He passed naturally on 2024-01-15.

## 2023-12-20 NOTE — Assessment & Plan Note (Addendum)
-   Previously on OxyContin  and oxycodone  at home presenting with uncontrolled pain however - Palliative care assisting with pain control - Component of anxiety and insomnia

## 2023-12-20 NOTE — Consult Note (Signed)
 [name] is well-known to me.  He is a very nice 43 year old African-American male.  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  He has CNS metastasis.  He recently underwent radiosurgery for these.  Has been on 2 lines of chemoimmunotherapy.  Unfortunately, his tumor continues to progress.  He has had progressive pain.  This pain is mostly in the chest wall.  He subsequently was admitted yesterday.  He was not eating.  He has lost some weight.  He has some nausea but occasional vomiting.  He had a CT angiogram that was done.  This showed that he had progressive disease.  He had lab work that was done.  They show sodium 137.  Potassium 4.8.  BUN 25 creatinine 1.37.  His SGPT is 190 SGOT 193.  Alkaline phosphatase 197.  His white cell count is 8.4.  Hemoglobin 13.8.  Platelet count 102,000.SABRA  He also has history of HIV.  This has been under good control.  Infectious Disease has been following this.  He is on Biktarvy .  Had a long talk with him this morning.  I just do not think that there is any additional therapy that we could utilize that would work and he would tolerate.  I think we are at the point that we really need to think about Hospice for him.  I explained hospice to him.  I explained how they work.  I think he would be amenable to hospice.  I did talk to about end-of-life issues.  Our goal clearly is his quality of life.  I want him to have comfort, respect and dignity.  Clearly, pain is his biggest problem.  We will have to see about readjusting his medication for pain.  I do appreciate the help from Palliative Care.  He is going to decide about being kept alive on a ventilator.  I told him that if he were put on life support, he would never come off.  His body is just too weak for him to come off.  He understands this.  He is going to the bathroom.  He has had no bleeding.  He has had no headache.  He has had no fever.  He has had no rashes.  His vital  signs show temperature 97.6.  Pulse 103.  Blood pressure 133/115.  His head and neck exam shows no ocular or oral lesions.  He has no palpable cervical or supraclavicular lymph nodes.  Lungs are relatively clear bilaterally.  He has good air movement bilaterally.  Cardiac exam regular rate and rhythm.  Has no murmurs, rubs or bruits.  Abdomen is soft.  Bowel sounds are present.  There is no fluid wave.  There is no palpable liver or spleen tip.  Extremities shows no clubbing, cyanosis or edema.  He has decent range of motion of his joints.  He has decent strength in upper and lower extremities.  Neurological exam is nonfocal.   Mr. Sky is a very nice 43 year old African-American male.  He has metastatic large cell neuroendocrine carcinoma.  He has been on 2 courses of chemotherapy.  His cancer continues to progress.  Again, our goal is quality of life.  I really think that we had to focus on his pain issues.  Hopefully we can get his pain under control so he can go home.  He is not a candidate for any additional therapy.  His performance status (ECOG 3) this is not all that good.  Again,  he will think about Life support.  Hopefully, we will see that he has better pain control.  Hopefully once we get the pain under control, he will be able to go home.  I know he will get great care for everybody up on 6 E.   Jeralyn Crease, MD  2 Timothy 4:6-8

## 2023-12-20 NOTE — Assessment & Plan Note (Addendum)
-   Underlying progressive cancer now with no further palliative treatment options and patient developing failure to thrive and functional decline - Palliative care helping assist with GOC discussions and symptom control - Per 9/12 discussion with palliative care and oncology; states he would not want resuscitation; code status has been changed  - developed acute worsening 01-23-2024, with [facility]; care transitioned to pure comfort in hospital; in-hospital death anticipated

## 2023-12-20 NOTE — Assessment & Plan Note (Signed)
-   Chronic and relatively stable.  Fluctuating baseline - No signs of bleeding

## 2023-12-20 NOTE — Plan of Care (Signed)

## 2023-12-20 NOTE — Progress Notes (Signed)
 Daily Progress Note   Patient Name: Christopher Henson       Date: 12/20/2023 DOB: 1980-04-11  Age: 43 y.o. MRN#: [id number] Attending Physician: Patsy Lenis, MD Primary Care Physician: Bulah Lenis RAMAN, PA-C Admit Date: 12/19/2023 Length of Stay: 0 days  Reason for Consultation/Follow-up: Establishing goals of care and symptom management  Subjective:   CC: Patient reports concerns related to pain and anxiety.  Following up regarding complex medical decision making and symptom management.  Subjective:  Reviewed EMR including recent documentation from oncologist and hospitalist.  Oncologist today discussed with patient that he is not a candidate for further cancer directed therapies.  Recommending returning home with hospice support.  Had also discussed CODE STATUS with patient which she was considering. At time of EMR review in the past 24 hours patient has received as needed IV Dilaudid  2 mg x 1 dose.  Patient has not received any as needed oxycodone . Opioids had already been adjusted by oncologist prior to palliative medicine provider seeing patient today.  Presented to bedside to see patient.  Patient laying in bed, no visitors present at bedside.  Introduced myself as a member of the palliative medicine team. Followed up regarding patient's symptom management at this time.  Patient notes he does have aspects of chest pain related to his cancer.  Patient describes the pain as radiating, shooting, and sometimes stabbing.  Patient denied being on medication to help with neuropathic pain previously.  Agreed with starting gabapentin  to assist with neuropathic pain component.  Discussed opioid management as well.  Patient notes he does not like the way the oxycodone  makes him feel.  Discussed that patient's long-acting medication had been increased this morning.  Patient does not want to take increased dose of OxyContin  30 mg every 12 hours because he was on OxyContin  15 mg every 12 hours previously  and feels that doubling this dose is too much of an increase for him.  Discussed reducing oxycodone  to 20 mg every 12 hours, and patient agrees with this.  With patient having concerns related to way oxycodone  makes him feel, discussed trying alternative medication for short acting.  Patient does feel the IV Dilaudid  helps with his pain when received though wears off quickly.  Discussed could trial oral Dilaudid  instead of IV Dilaudid  to determine if this would better manage his pain without adverse effects.  Patient agreement with this plan.  Patient also describing aspects of anxiety.  Normalized feelings of anxiety with patient's underlying diagnosis.  Discussed medications for management of this.  Patient agreed to start low-dose Ativan  to determine if this will help with his anxiety.  Patient believes he has received this medication previously that benefited him.  Discussed if he is needing frequent dosing, could consider long-acting benzodiazepine to assist with anxiety management as well.  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 Individuals present for the conversation: Patient participated in conversation with this provider as detailed below.  Summary of the conversation:   With permission, also able to discuss patient's cancer progression and care moving forward.  Patient noted that oncologist had mentioned hospice though he did not quite grasp what hospice would entail or was.  Was able to discuss philosophy of hospice focusing on symptom management and quality of life at the end of life with  a prognosis of 6 months or less.  Spent time explaining what home  hospice support would generally provide.  Discussed that hospice would not be present with patient at home 24/7 so would need assistance from family for caregiving.  Patient noted that his sister does help him at home though she cannot be present 24/7.  Noted at this time patient is able to participate in his care so as cancer progresses, may need more assistance at home from other family members or may have progressed to point where he becomes appropriate for inpatient hospice.  Spent time answering questions as related to hospice.  Patient noted that it would make sense for him to get hospice at home if he cannot receive further cancer directed therapies.  It is just a lot for him to process so he is further considering it.  Acknowledges this and offered patient time and space to express feelings regarding this.  Also able to discuss patient's CODE STATUS.  Patient had further questions regarding this so spent time explaining full code versus DNR/DNI.  Expressed concern that if patient were ill enough his heart were to stop or he were to stop breathing, interventions such as cardiac resuscitation and intubation with mechanical ventilation would not lead to good quality of life outcomes as would not improve patient's underlying worsening metastatic cancer burden.  Patient acknowledged this.  Spent time answering questions as able and patient noted he would consider this as well.  Outcome of the conversations and/or documents completed:  Answered questions regarding CODE STATUS and hospice support at home.  Patient considering this information.  Remains full code at this time.  I spent 20 minutes providing separately identifiable ACP services with the patient and/or surrogate decision maker in a voluntary, in-person conversation discussing the patient's wishes and goals as detailed in the above note.  Tinnie Radar, DO Palliative Medicine Provider   -------------------------------------------------------------------------------------------------------------  Review of Systems Pain and anxiety  Objective:   Vital Signs:  BP (!) 133/115 (BP Location: Right Arm)   Pulse (!) 103   Temp 97.6 F (36.4 C) (Oral)   Resp 18   Ht 6' (1.829 m)   Wt 74.1 kg   SpO2 99%   BMI 22.16 kg/m   Physical Exam: General: NAD, alert, cachectic, frail Cardiovascular: Tachycardia noted Respiratory: no increased work of breathing noted, not in respiratory distress Neuro: A&Ox4, following commands easily Psych: appropriately answers all questions  Assessment & Plan:   Assessment: Patient is a 43 y.o. male  with past medical history of neuroendocrine cancer of the left lung with mets to brain, liver, L adrenal gland, bones (s/p SBRT, currently on Avastin /Alimta /Zometa ), HIV positive, recent hospitalization at [facility] for dental abscess and tooth abstractions, admitted on 12/19/2023 with complaints of chest pain. CTA showed progression of cancer. Palliative medicine consulted for symptom management and goals of care.   Recommendations/Plan: # Complex medical decision making/goals of care:  - Discussed care with patient as detailed above in HPI.  Spent time discussing philosophy of hospice and what home hospice support would and would not entail.  Also spent time explaining full code versus DNR/DNI and concern related to this in setting of metastatic neuroendocrine cancer.  Patient is not a candidate for further cancer directed therapies as per oncology.  Patient considering information provided today.  Palliative medicine team will continue to follow along to engage in conversations as able and appropriate.  -  Code Status: Full Code  # Symptom management: Patient is receiving  these palliative interventions for symptom management with an intent to improve quality of life.   - Pain/dyspnea, in setting of metastatic neuroendocrine cancer   - Change OxyContin   to 20 mg every 12 hours scheduled   - Discontinue as needed oxycodone    - Start oral Dilaudid  2 mg tablet every 4 hours as needed   - Change IV Dilaudid  to 1-2 every 4 hours as needed breakthrough after oral Dilaudid    - Start gabapentin  100 mg nightly   - Continue Tylenol  650 3 times daily   - Anxiety, in setting of metastatic neuroendocrine cancer   - Start Ativan  0.5 mg tablet every 6 hours as needed   - Considering addition of long-acting benzodiazepine  # Psychosocial Support:  - Sister- Verneita Hurst  # Discharge Planning: To Be Determined  Discussed with: Hospitalist, oncologist, RN, patient  Thank you for allowing the palliative care team to participate in the care Christopher Henson.  Tinnie Radar, DO Palliative Care Provider PMT # [phone]  If patient remains symptomatic despite maximum doses, please call PMT at [phone] between 0700 and 1900. Outside of these hours, please call attending, as PMT does not have night coverage.  Billing based on MDM: High  Problems Addressed: One or more chronic illnesses with severe exacerbation, progression, or side effects of treatment.  Risks: Parenteral controlled substances

## 2023-12-20 NOTE — Progress Notes (Signed)
 Progress Note    Christopher Henson   FMW:[id number]  DOB: 06-24-1980  DOA: 12/19/2023     0 PCP: Bulah Alm RAMAN, PA-C  Initial CC: pain  Hospital Course: Christopher Henson is a 43 year old male with PMH large cell neuroendocrine carcinoma of left lung with CNS metastasis.  He has been treated with radiosurgery and failed 2 lines of chemoimmunotherapy per oncology.  He has had progression of disease despite treatments.  He continues to have uncontrolled cancer associated pain mostly involving his left chest wall area. Most recently he has also undergone hospitalization at [facility] on 12/10/2023 due to mandibular abscess requiring teeth extractions and washout of oral abscess.  He was discharged home on a course of Augmentin  and doxycycline  with outpatient follow-up with ID planned also. Other PMH includes HIV/AIDS, GERD.  He presented for this hospitalization with chest pain and shortness of breath.  He has also had poor appetite and intake recently.  Hospitalization directed for involving palliative care and pain control approach.  Interval History:  Sitting on edge of bed this morning appearing uncomfortable and depressed mood.  Achieving pain control is his biggest objective at this time. He understands hospice recommendations and is still processing information but is overall goal is to go home likely with hospice in place.  Assessment and Plan: Large cell neuroendocrine carcinoma (HCC) - Follows with Dr. Timmy - Has undergone treatment historically with radiosurgery and 2 lines of chemoimmunotherapy now with progressive disease on last CT - He is recommended for consideration of comfort measures and hospice at this time -Pain control is his biggest complaint, see separate problem -Palliative care also consulted on admission  Cancer associated pain - Previously on OxyContin  and oxycodone  at home presenting with uncontrolled pain however - Palliative care assisting with pain control at this time -  Component of anxiety and insomnia also contributing which is being addressed  Goals of care, counseling/discussion - Underlying progressive cancer now with no further palliative treatment options and patient developing failure to thrive and functional decline - Palliative care helping assist with GOC discussions and symptom control  HIV (human immunodeficiency virus infection) (HCC) - Continue Biktarvy  - Per [facility], CD4 count was 89 on 12/10/23 on their admission and he was started on Bactrim  for PJP prophylaxis as well  AKI (acute kidney injury) (HCC)-resolved as of 12/20/2023 - Patient endorsing poor appetite and decreased intake recently - Creatinine 1.37 on admission and has improved  Mandibular abscess - Recent hospitalization at [facility] from 12/10/2023 until 12/14/2023 -Treated for a large left mandibular abscess.  Underwent extraction of root tips #19 and 20 along with I&D of the left mandibular vestibule with placement of Penrose drain.  He was discharged on Augmentin  and doxycycline  to complete total of 14-day course from source control on 12/12/2023 and outpatient follow-up with Dr. Overton with ID -Continue Augmentin  and doxycycline   Thrombocytopenia (HCC) - Chronic and relatively stable.  Fluctuating baseline - No signs of bleeding   Old records reviewed in assessment of this patient  Antimicrobials: Augmentin  Doxycycline  Biktarvy  Bactrim   DVT prophylaxis:  SCDs Start: 12/19/23 1220   Code Status:   Code Status: Full Code  Mobility Assessment (Last 72 Hours)     Mobility Assessment     Row Name 12/20/23 0800 12/19/23 2003 12/19/23 1757       Does the patient have exclusion criteria? No - Perform mobility assessment No - Perform mobility assessment No - Perform mobility assessment     What is the highest level  of mobility based on the mobility assessment? Level 5 (Ambulates independently) - Balance while walking independently - Complete Level 5 (Ambulates independently) - Balance  while walking independently - Complete Level 5 (Ambulates independently) - Balance while walking independently - Complete        Barriers to discharge: None Disposition Plan: Home with hospice most likely HH orders placed: ED Status is: Observation  Objective: Blood pressure (!) 133/115, pulse (!) 103, temperature 97.6 F (36.4 C), temperature source Oral, resp. rate 18, height 6' (1.829 m), weight 74.1 kg, SpO2 99%.  Examination:  Physical Exam Constitutional:      Appearance: Normal appearance.     Comments: Depressed appearing and uncomfortable appearing young adult man sitting on edge of bed  HENT:     Head: Normocephalic and atraumatic.     Mouth/Throat:     Mouth: Mucous membranes are moist.  Eyes:     Comments: Abnormal appearing R eye, but patient deferred exam  Cardiovascular:     Rate and Rhythm: Normal rate and regular rhythm.  Pulmonary:     Effort: Pulmonary effort is normal. No respiratory distress.     Breath sounds: Normal breath sounds. No wheezing.  Abdominal:     General: Bowel sounds are normal. There is no distension.     Palpations: Abdomen is soft.     Tenderness: There is no abdominal tenderness.  Musculoskeletal:        General: Normal range of motion.     Cervical back: Normal range of motion and neck supple.  Skin:    General: Skin is warm and dry.  Neurological:     General: No focal deficit present.  Psychiatric:        Mood and Affect: Mood normal.        Behavior: Behavior normal.      Consultants:  Palliative care Oncology  Procedures:    Data Reviewed: Results for orders placed or performed during the hospital encounter of 12/19/23 (from the past 24 hours)  Glucose, capillary     Status: Abnormal   Collection Time: 12/19/23  8:55 PM  Result Value Ref Range   Glucose-Capillary 109 (H) 70 - 99 mg/dL  Basic metabolic panel     Status: Abnormal   Collection Time: 12/20/23  7:42 AM  Result Value Ref Range   Sodium 136 135 - 145  mmol/L   Potassium 4.7 3.5 - 5.1 mmol/L   Chloride 98 98 - 111 mmol/L   CO2 18 (L) 22 - 32 mmol/L   Glucose, Bld 96 70 - 99 mg/dL   BUN 24 (H) 6 - 20 mg/dL   Creatinine, Ser 8.90 0.61 - 1.24 mg/dL   Calcium  9.2 8.9 - 10.3 mg/dL   GFR, Estimated >39 >39 mL/min   Anion gap 20 (H) 5 - 15  CBC     Status: Abnormal   Collection Time: 12/20/23  7:42 AM  Result Value Ref Range   WBC 9.1 4.0 - 10.5 K/uL   RBC 4.73 4.22 - 5.81 MIL/uL   Hemoglobin 15.3 13.0 - 17.0 g/dL   HCT 51.9 60.9 - 47.9 %   MCV 101.5 (H) 80.0 - 100.0 fL   MCH 32.3 26.0 - 34.0 pg   MCHC 31.9 30.0 - 36.0 g/dL   RDW 82.1 (H) 88.4 - 84.4 %   Platelets 70 (L) 150 - 400 K/uL   nRBC 0.8 (H) 0.0 - 0.2 %    I have reviewed pertinent nursing notes, vitals, labs,  and images as necessary. I have ordered labwork to follow up on as indicated.  I have reviewed the last notes from staff over past 24 hours. I have discussed patient's care plan and test results with nursing staff, CM/SW, and other staff as appropriate.  Time spent: Greater than 50% of the 55 minute visit was spent in counseling/coordination of care for the patient as laid out in the A&P.   LOS: 0 days   Alm Apo, MD [facility] 12/20/2023, 12:28 PM

## 2023-12-21 ENCOUNTER — Ambulatory Visit

## 2023-12-21 ENCOUNTER — Other Ambulatory Visit

## 2023-12-21 ENCOUNTER — Ambulatory Visit: Admitting: Family

## 2023-12-21 DIAGNOSIS — Z923 Personal history of irradiation: Secondary | ICD-10-CM | POA: Diagnosis not present

## 2023-12-21 DIAGNOSIS — C7951 Secondary malignant neoplasm of bone: Secondary | ICD-10-CM | POA: Diagnosis not present

## 2023-12-21 DIAGNOSIS — R627 Adult failure to thrive: Secondary | ICD-10-CM | POA: Diagnosis not present

## 2023-12-21 DIAGNOSIS — R54 Age-related physical debility: Secondary | ICD-10-CM | POA: Diagnosis not present

## 2023-12-21 DIAGNOSIS — G4700 Insomnia, unspecified: Secondary | ICD-10-CM | POA: Diagnosis not present

## 2023-12-21 DIAGNOSIS — B20 Human immunodeficiency virus [HIV] disease: Secondary | ICD-10-CM | POA: Diagnosis not present

## 2023-12-21 DIAGNOSIS — Z515 Encounter for palliative care: Secondary | ICD-10-CM | POA: Diagnosis not present

## 2023-12-21 DIAGNOSIS — E875 Hyperkalemia: Secondary | ICD-10-CM | POA: Diagnosis not present

## 2023-12-21 DIAGNOSIS — C7931 Secondary malignant neoplasm of brain: Secondary | ICD-10-CM | POA: Diagnosis not present

## 2023-12-21 DIAGNOSIS — N179 Acute kidney failure, unspecified: Secondary | ICD-10-CM | POA: Diagnosis present

## 2023-12-21 DIAGNOSIS — Z7189 Other specified counseling: Secondary | ICD-10-CM | POA: Diagnosis not present

## 2023-12-21 DIAGNOSIS — C7800 Secondary malignant neoplasm of unspecified lung: Secondary | ICD-10-CM | POA: Diagnosis not present

## 2023-12-21 DIAGNOSIS — K5900 Constipation, unspecified: Secondary | ICD-10-CM | POA: Diagnosis not present

## 2023-12-21 DIAGNOSIS — G893 Neoplasm related pain (acute) (chronic): Secondary | ICD-10-CM | POA: Diagnosis not present

## 2023-12-21 DIAGNOSIS — Z66 Do not resuscitate: Secondary | ICD-10-CM | POA: Diagnosis not present

## 2023-12-21 DIAGNOSIS — Z87891 Personal history of nicotine dependence: Secondary | ICD-10-CM | POA: Diagnosis not present

## 2023-12-21 DIAGNOSIS — D696 Thrombocytopenia, unspecified: Secondary | ICD-10-CM | POA: Diagnosis not present

## 2023-12-21 DIAGNOSIS — R110 Nausea: Secondary | ICD-10-CM

## 2023-12-21 DIAGNOSIS — E441 Mild protein-calorie malnutrition: Secondary | ICD-10-CM | POA: Diagnosis not present

## 2023-12-21 DIAGNOSIS — Z79899 Other long term (current) drug therapy: Secondary | ICD-10-CM | POA: Diagnosis not present

## 2023-12-21 DIAGNOSIS — R64 Cachexia: Secondary | ICD-10-CM | POA: Diagnosis not present

## 2023-12-21 DIAGNOSIS — E860 Dehydration: Secondary | ICD-10-CM | POA: Diagnosis not present

## 2023-12-21 DIAGNOSIS — R079 Chest pain, unspecified: Secondary | ICD-10-CM | POA: Diagnosis not present

## 2023-12-21 DIAGNOSIS — Z8249 Family history of ischemic heart disease and other diseases of the circulatory system: Secondary | ICD-10-CM | POA: Diagnosis not present

## 2023-12-21 DIAGNOSIS — K122 Cellulitis and abscess of mouth: Secondary | ICD-10-CM | POA: Diagnosis not present

## 2023-12-21 DIAGNOSIS — Z833 Family history of diabetes mellitus: Secondary | ICD-10-CM | POA: Diagnosis not present

## 2023-12-21 DIAGNOSIS — F419 Anxiety disorder, unspecified: Secondary | ICD-10-CM | POA: Diagnosis not present

## 2023-12-21 DIAGNOSIS — F32A Depression, unspecified: Secondary | ICD-10-CM | POA: Diagnosis not present

## 2023-12-21 DIAGNOSIS — K5903 Drug induced constipation: Secondary | ICD-10-CM | POA: Diagnosis not present

## 2023-12-21 DIAGNOSIS — C7A1 Malignant poorly differentiated neuroendocrine tumors: Secondary | ICD-10-CM | POA: Diagnosis not present

## 2023-12-21 MED ORDER — METOCLOPRAMIDE HCL 5 MG PO TABS
5.0000 mg | ORAL_TABLET | Freq: Three times a day (TID) | ORAL | Status: DC
  Administered 2023-12-21 – 2023-12-23 (×8): 5 mg via ORAL
  Filled 2023-12-21 (×9): qty 1

## 2023-12-21 MED ORDER — LIDOCAINE 5 % EX PTCH
1.0000 | MEDICATED_PATCH | CUTANEOUS | Status: DC
  Administered 2023-12-21 – 2023-12-23 (×3): 1 via TRANSDERMAL
  Filled 2023-12-21 (×3): qty 1

## 2023-12-21 MED ORDER — GABAPENTIN 100 MG PO CAPS
100.0000 mg | ORAL_CAPSULE | Freq: Three times a day (TID) | ORAL | Status: DC
  Administered 2023-12-21 – 2023-12-23 (×6): 100 mg via ORAL
  Filled 2023-12-21 (×6): qty 1

## 2023-12-21 MED ORDER — DEXAMETHASONE SODIUM PHOSPHATE 10 MG/ML IJ SOLN
8.0000 mg | Freq: Once | INTRAMUSCULAR | Status: AC
  Administered 2023-12-21: 8 mg via INTRAVENOUS
  Filled 2023-12-21: qty 1

## 2023-12-21 MED ORDER — BISACODYL 10 MG RE SUPP: 10.0000 mg | Freq: Every day | RECTAL | Status: DC | PRN

## 2023-12-21 MED ORDER — DEXAMETHASONE 4 MG PO TABS
4.0000 mg | ORAL_TABLET | Freq: Every day | ORAL | Status: DC
  Administered 2023-12-22 – 2023-12-23 (×2): 4 mg via ORAL
  Filled 2023-12-21 (×3): qty 1

## 2023-12-21 MED ORDER — HYDROMORPHONE HCL 2 MG PO TABS
2.0000 mg | ORAL_TABLET | ORAL | Status: DC | PRN
  Administered 2023-12-22: 4 mg via ORAL
  Filled 2023-12-21: qty 2

## 2023-12-21 MED ORDER — SENNA 8.6 MG PO TABS
2.0000 | ORAL_TABLET | Freq: Two times a day (BID) | ORAL | Status: DC
  Administered 2023-12-21 – 2023-12-23 (×4): 17.2 mg via ORAL
  Filled 2023-12-21 (×5): qty 2

## 2023-12-21 MED ORDER — LORAZEPAM 0.5 MG PO TABS: 0.5000 mg | ORAL_TABLET | Freq: Four times a day (QID) | ORAL | Status: DC | PRN

## 2023-12-21 NOTE — Progress Notes (Signed)
 Overall, his pain might be a little bit better.  He still having problems in the left upper quadrant.  Maybe, a Lidoderm  patch will help.  I know that Palliative Care is trying to help with his pain.  Hopefully, we can get him on a regimen that we will help.  I did have a long talk with him this morning.  I talked him about end-of-life issues again.  I talked him about being placed on life support in case so what happened to him.  He said that he would not want to be kept alive on a machine.  I think that this is the right decision.  I think if he were to be put on life support, he would never come off as his body would be too weak to come off.  I would hate to see him on life support and then have a family member take him off.  This would be incredibly hard for a family member.  As such, he is a DO NOT RESUSCITATE.  I told him that even though he is a DO NOT RESUSCITATE, that we would still try our best to give him quality of life.  I am not sure how much he is really eating.  He does not have much of an appetite.  He has had no bleeding.  He has had no cough.  He has had no shortness of breath.  His vital signs are mostly stable.  Temperature is 98.2.  Pulse 96.  Blood pressure 145/96.  He does have some tenderness in the left upper quadrant of the abdomen.  Again, I think a Lidoderm  patch would not be a bad idea for this.  I am not sure when he will be able to go home.  I would like to have his pain under control before he does go home.  I think this would be a good thing for him.  I think that Hospice would certainly be a reasonable approach for him when he does get home.   Jeralyn Crease, MD

## 2023-12-21 NOTE — Progress Notes (Signed)
 Daily Progress Note   Patient Name: Christopher Henson       Date: 12/21/2023 DOB: 1980-12-21  Age: 43 y.o. MRN#: [id number] Attending Physician: Patsy Lenis, MD Primary Care Physician: Bulah Lenis RAMAN, PA-C Admit Date: 12/19/2023 Length of Stay: 0 days  Reason for Consultation/Follow-up: Establishing goals of care and symptom management  Subjective:   CC: Patient still having pain and nausea.  Following up regarding complex medical decision making and symptom management.  Subjective:  Reviewed EMR including recent documentation from hospitalist and radiation oncologist.  Patient informed radiation oncology that he does not want to finish his radiation and would like to proceed with hospice care.  Patient had completed 8 out of 10 radiation treatments to the brain; radiation oncology supporting no longer pursuing radiation therapy. At time of EMR review in past 24 hours patient has received as needed IV Dilaudid  1 mg x 1 dose and 2 mg x 2 doses.  Patient received as needed oral Dilaudid  2 mg x 1 dose.  Patient received as needed oral Ativan  tablet 0.5 mg x 1 dose.  Patient receiving OxyContin  20 mg every 12 hours scheduled and gabapentin  100 mg nightly.  Presented to bedside to see patient.  No visitors present at bedside.  Able to discuss patient's symptom management at this time.  Patient notes still having pain and has not been able to take oral pain medications regularly due to now having nausea.  Patient notes he has not had a bowel movement in many days.  Discussed importance of regular bowel movements to prevent constipation which can worsen nausea.  Noted providing more aggressive bowel regimen today and possibly suppository if needed. In terms of patient's pain management, patient does feel the IV Dilaudid  works when he receives it.  Noted would increase dose of oral Dilaudid  so he can take it to see if this helps more as well.  Noted would also increase gabapentin  to help with neuropathic  pain aspects.  Also going to start steroids to help with pain. Patient felt the as needed Ativan  to help with anxiety.  Discussed care planning moving forward.  Patient notes that he would want to go home with hospice support once his symptoms are better controlled.  Noted would involve TOC to assist with providing hospice referral choice.  Also discussed CODE STATUS with patient.  Patient states that he would not want to be resuscitated so wants to talk to his son before he officially change his CODE STATUS.  Again spent time discussing full code versus DNR/DNI.  All questions answered at that time.  Noted palliative medicine team continue to follow with patient's medical journey.  Discussed care with hospitalist, oncologist, RN, and TOC to coordinate care. Objective:   Vital Signs:  BP (!) 157/115 (BP Location: Left Arm)   Pulse (!) 102   Temp 98.2 F (36.8 C) (Oral)   Resp 18   Ht 6' (1.829 m)   Wt 74.1 kg   SpO2 99%   BMI 22.16 kg/m   Physical Exam: General: NAD, alert, cachectic, frail Cardiovascular: Tachycardia noted Respiratory: no increased work of breathing noted, not in respiratory distress Neuro: A&Ox4, following commands easily Psych: appropriately answers all questions  Assessment & Plan:   Assessment: Patient is a 43 y.o. male  with past medical history of neuroendocrine cancer of the left lung with mets to brain, liver, L adrenal gland, bones (s/p SBRT, currently on Avastin /Alimta /Zometa ), HIV positive, recent hospitalization at [facility] for dental abscess and tooth abstractions,  admitted on 12/19/2023 with complaints of chest pain. CTA showed progression of cancer. Palliative medicine consulted for symptom management and goals of care.   Recommendations/Plan: # Complex medical decision making/goals of care:  - Discussed care with patient as detailed above in HPI. Patient is not a candidate for further cancer directed therapies as per oncology.  Patient wanting to get  home hospice support once appropriate for discharge.  Placed referral for TOC to offer home hospice choice.  Palliative medicine team will continue to follow along to engage in conversations as able and appropriate.  -  Code Status: Full Code   - While patient notes that he would not want to be resuscitated, he wants to discuss change of CODE STATUS to DNR/DNI with his son before officially changing it.  Remains full code.  # Symptom management: Patient is receiving these palliative interventions for symptom management with an intent to improve quality of life.   - Pain/dyspnea, in setting of metastatic neuroendocrine cancer   - Continue OxyContin  to 20 mg every 12 hours scheduled   - Increase oral Dilaudid  to 2-4 mg tablet every 4 hours as needed   - Continue IV Dilaudid  to 1-2 every 4 hours as needed breakthrough after oral Dilaudid    - Increase gabapentin  100 mg every 8 hours during the day   - Continue Tylenol  650 3 times daily   - Give one-time dose of IV dexamethasone  8 mg today   - Start oral dexamethasone  4 mg daily on 12/22/2023   - Anxiety, in setting of metastatic neuroendocrine cancer   - Continue Ativan  0.5 mg tablet every 6 hours as needed   - Considering addition of long-acting benzodiazepine   - Constipation   - Increase senna to 2 tabs twice daily   - Start as needed bisacodyl  suppository   - Nausea/vomiting   - Reviewed EKG on 12/19/2023 noting QTc 446   - Allowing Ativan  to be given for nausea   - Start Reglan  5 mg 3 times daily daily before meals   - Receiving steroids as well  # Psychosocial Support:  - Sister- Verneita Hurst  # Discharge Planning: Home with Hospice - TOC assist with home hospice referral  Discussed with: Hospitalist, oncologist, RN, patient, [facility]  Thank you for allowing the palliative care team to participate in the care [name].  Tinnie Radar, DO Palliative Care Provider PMT # [phone]  If patient remains symptomatic despite maximum  doses, please call PMT at [phone] between 0700 and 1900. Outside of these hours, please call attending, as PMT does not have night coverage.  Personally spent 50 minutes in patient care including extensive chart review (labs, imaging, progress/consult notes, vital signs), medically appropraite exam, discussed with treatment team, education to patient, family, and staff, documenting clinical information, medication review and management, coordination of care, and available advanced directive documents.

## 2023-12-21 NOTE — TOC Initial Note (Signed)
 Transition of Care [facility]) - Initial/Assessment Note    Patient Details  Name: Christopher Henson MRN: [id number] Date of Birth: Apr 07, 1981  [facility] CM/SW Contact:    Toy LITTIE Agar, RN Phone Number:[phone]  12/21/2023, 3:54 PM  Clinical Narrative:                 [facility] acknowledges consult for home with hospice referral. CM at bedside to offer choice. Patient would like [facility]. Message has been sent to Hunter Seip to follow up on referral.         Patient Goals and CMS Choice            Expected Discharge Plan and Services                                              Prior Living Arrangements/Services                       Activities of Daily Living   ADL Screening (condition at time of admission) Independently performs ADLs?: Yes (appropriate for developmental age) Is the patient deaf or have difficulty hearing?: No Does the patient have difficulty seeing, even when wearing glasses/contacts?: No Does the patient have difficulty concentrating, remembering, or making decisions?: No  Permission Sought/Granted                  Emotional Assessment              Admission diagnosis:  AKI (acute kidney injury) (HCC) [N17.9] Metastatic lung carcinoma, unspecified laterality (HCC) [C78.00] Dyspnea, unspecified type [R06.00] Patient Active Problem List   Diagnosis Date Noted   Constipation 12/21/2023   Nausea 12/21/2023   AKI (acute kidney injury) (HCC) 12/21/2023   Cancer associated pain 12/20/2023   Goals of care, counseling/discussion 12/20/2023   Metastatic lung carcinoma, unspecified laterality (HCC) 12/20/2023   Anxiety 12/20/2023   Counseling and coordination of care 12/20/2023   ACP (advance care planning) 12/20/2023   DNR (do not resuscitate) discussion 12/20/2023   High risk medication use 12/20/2023   Medication management 12/20/2023   Palliative care encounter 12/20/2023   Facial cellulitis 12/10/2023    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) 12/10/2023   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   History of homeless 01/09/2023   Essential hypertension, benign 01/09/2023   Mediastinal mass 12/18/2022   Alcoholism (HCC) 12/18/2022   Smoker 12/18/2022   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   Encounter for long-term (current) use of high-risk medication 06/08/2022   Mandibular abscess 11/28/2019   Thrombocytopenia (HCC) 02/25/2019   Elevated liver enzymes 02/25/2019   Alcohol use 02/21/2019   Depression 12/10/2018   Poor dentition 10/03/2018   HIV (human immunodeficiency virus infection) (HCC) 09/05/2018   Protein-calorie malnutrition, severe 06/10/2018   History of cryptococcal meningitis    PCP:  Bulah Alm RAMAN, PA-C Pharmacy:   [facility] [place] [facility] [address], [address] [place] [address] Phone: [phone] Fax: [phone]  [id number] - [place], [place] - [address] AT [facility] OF [address] & [address] [place] [address] Phone: [phone] Fax: [phone]     Social Drivers of Health (SDOH) Social History: SDOH Screenings   Food Insecurity: Food Insecurity Present (12/19/2023)  Housing: High  Risk (12/19/2023)  Transportation Needs: Unmet Transportation Needs (12/19/2023)  Utilities: Not At Risk (12/19/2023)  Alcohol Screen: High Risk (01/10/2023)  Depression (PHQ2-9): Low Risk  (12/07/2023)  Recent Concern: Depression (PHQ2-9) - High Risk (11/14/2023)  Financial Resource Strain: Medium Risk (12/12/2023)   Received from [facility]  Physical Activity: Inactive (01/10/2023)  Social Connections: Socially Isolated (01/10/2023)  Stress: Stress Concern Present (01/10/2023)  Tobacco Use: Medium Risk (12/19/2023)  Health Literacy: Adequate Health Literacy (01/10/2023)   SDOH Interventions:     Readmission Risk Interventions     01/02/2023    4:06 PM  Readmission Risk Prevention Plan  Transportation Screening Complete  PCP or Specialist Appt within 3-5 Days Complete  HRI or Home Care Consult Complete  Social Work Consult for Recovery Care Planning/Counseling Complete  Palliative Care Screening Complete  Medication Review [employer]) Complete

## 2023-12-21 NOTE — Plan of Care (Signed)
  Problem: Education: Goal: Knowledge of General Education information will improve Description: Including pain rating scale, medication(s)/side effects and non-pharmacologic comfort measures Outcome: Progressing   Problem: Health Behavior/Discharge Planning: Goal: Ability to manage health-related needs will improve Outcome: Progressing   Problem: Nutrition: Goal: Adequate nutrition will be maintained Outcome: Progressing   Problem: Coping: Goal: Level of anxiety will decrease Outcome: Progressing   Problem: Pain Managment: Goal: General experience of comfort will improve and/or be controlled Outcome: Progressing   Problem: Skin Integrity: Goal: Risk for impaired skin integrity will decrease Outcome: Progressing

## 2023-12-21 NOTE — Progress Notes (Signed)
   12/21/23 1553  TOC Brief Assessment  Insurance and Status Reviewed  Patient has primary care physician Yes  Home environment has been reviewed Home  Prior level of function: Independent  Prior/Current Home Services No current home services  Social Drivers of Health Review SDOH reviewed interventions complete (Home hospice referral.)  Readmission risk has been reviewed Yes  Transition of care needs transition of care needs identified, TOC will continue to follow

## 2023-12-21 NOTE — Progress Notes (Signed)
 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  He has been treated with radiosurgery and failed 2 lines of chemoimmunotherapy per oncology.  He has had progression of disease despite treatments.  He continues to have uncontrolled cancer associated pain mostly involving his left chest wall area. Most recently he has also undergone hospitalization at [facility] on 12/10/2023 due to mandibular abscess requiring teeth extractions and washout of oral abscess.  He was discharged home on a course of Augmentin  and doxycycline  with outpatient follow-up with ID planned also. Other PMH includes HIV/AIDS, GERD.  He presented for this hospitalization with chest pain and shortness of breath.  He has also had poor appetite and intake recently.  Hospitalization directed for involving palliative care and pain control approach.  Interval History:  Slightly more comfortable appearing but still states pain is about 7/10.   Assessment and Plan: * Large cell neuroendocrine carcinoma (HCC) - Follows with Dr. Timmy - Has undergone treatment historically with radiosurgery and 2 lines of chemoimmunotherapy now with progressive disease on last CT - He is recommended for consideration of comfort measures and hospice at this time -Pain control is his biggest complaint, see separate problem -Palliative care also consulted on admission  Cancer associated pain - Previously on OxyContin  and oxycodone  at home presenting with uncontrolled pain however - Palliative care assisting with pain control at this time - Component of anxiety and insomnia also contributing which is being addressed  Goals of care, counseling/discussion - Underlying progressive cancer now with no further palliative treatment options and  patient developing failure to thrive and functional decline - Palliative care helping assist with GOC discussions and symptom control - Per 9/12 discussion with palliative care and oncology; states he would not want resuscitation but is waiting to talk to his son before officially changing code status in epic  HIV (human immunodeficiency virus infection) (HCC) - Continue Biktarvy  - Per [facility], CD4 count was 89 on 12/10/23 on their admission and he was started on Bactrim  for PJP prophylaxis as well  AKI (acute kidney injury) (HCC)-resolved as of 12/20/2023 - Patient endorsing poor appetite and decreased intake recently - Creatinine 1.37 on admission and has improved  Mandibular abscess - Recent hospitalization at [facility] from 12/10/2023 until 12/14/2023 -Treated for a large left mandibular abscess.  Underwent extraction of root tips #19 and 20 along with I&D of the left mandibular vestibule with placement of Penrose drain.  He was discharged on Augmentin  and doxycycline  to complete total of 14-day course from source control on 12/12/2023 and outpatient follow-up with Dr. Overton with ID -Continue Augmentin  and doxycycline   Thrombocytopenia (HCC) - Chronic and relatively stable.  Fluctuating baseline - No signs of bleeding   Old records reviewed in assessment of this patient  Antimicrobials: Augmentin  Doxycycline  Biktarvy  Bactrim   DVT prophylaxis:  SCDs Start: 12/19/23 1220   Code Status:   Code Status: Full Code  Mobility Assessment (Last 72 Hours)     Mobility Assessment     Row Name 12/21/23 0840 12/20/23 2013 12/20/23 0800 12/19/23 2003 12/19/23 1757   Does the patient have exclusion criteria? No - Perform mobility assessment No - Perform mobility assessment No - Perform mobility assessment No - Perform mobility assessment No - Perform mobility assessment  What is the highest level of mobility based on the mobility assessment? Level 5 (Ambulates independently) - Balance while walking  independently - Complete Level 5 (Ambulates independently) - Balance while walking independently - Complete Level 5 (Ambulates independently) - Balance while walking independently - Complete Level 5 (Ambulates independently) - Balance while walking independently - Complete Level 5 (Ambulates independently) - Balance while walking independently - Complete      Barriers to discharge: None Disposition Plan: Home with hospice most likely HH orders placed: ED Status is: Inpt  Objective: Blood pressure (!) 145/96, pulse 96, temperature 98.2 F (36.8 C), temperature source Oral, resp. rate 20, height 6' (1.829 m), weight 74.1 kg, SpO2 99%.  Examination:  Physical Exam Constitutional:      Appearance: Normal appearance.     Comments: Depressed appearing and uncomfortable appearing young adult man sitting on edge of bed  HENT:     Head: Normocephalic and atraumatic.     Mouth/Throat:     Mouth: Mucous membranes are moist.  Eyes:     Comments: Abnormal appearing R eye, but patient deferred exam  Cardiovascular:     Rate and Rhythm: Normal rate and regular rhythm.  Pulmonary:     Effort: Pulmonary effort is normal. No respiratory distress.     Breath sounds: Normal breath sounds. No wheezing.  Abdominal:     General: Bowel sounds are normal. There is no distension.     Palpations: Abdomen is soft.     Tenderness: There is no abdominal tenderness.  Musculoskeletal:        General: Normal range of motion.     Cervical back: Normal range of motion and neck supple.  Skin:    General: Skin is warm and dry.  Neurological:     General: No focal deficit present.  Psychiatric:        Mood and Affect: Mood normal.        Behavior: Behavior normal.      Consultants:  Palliative care Oncology  Procedures:    Data Reviewed: No results found for this or any previous visit (from the past 24 hours).   I have reviewed pertinent nursing notes, vitals, labs, and images as necessary. I have  ordered labwork to follow up on as indicated.  I have reviewed the last notes from staff over past 24 hours. I have discussed patient's care plan and test results with nursing staff, CM/SW, and other staff as appropriate.  Time spent: Greater than 50% of the 55 minute visit was spent in counseling/coordination of care for the patient as laid out in the A&P.   LOS: 0 days   Alm Apo, MD [facility] 12/21/2023, 4:35 PM

## 2023-12-22 ENCOUNTER — Inpatient Hospital Stay (HOSPITAL_COMMUNITY)

## 2023-12-22 DIAGNOSIS — C7A1 Malignant poorly differentiated neuroendocrine tumors: Secondary | ICD-10-CM | POA: Diagnosis not present

## 2023-12-22 DIAGNOSIS — K5903 Drug induced constipation: Secondary | ICD-10-CM | POA: Diagnosis not present

## 2023-12-22 DIAGNOSIS — B20 Human immunodeficiency virus [HIV] disease: Secondary | ICD-10-CM | POA: Diagnosis not present

## 2023-12-22 DIAGNOSIS — G893 Neoplasm related pain (acute) (chronic): Secondary | ICD-10-CM | POA: Diagnosis not present

## 2023-12-22 DIAGNOSIS — Z7189 Other specified counseling: Secondary | ICD-10-CM | POA: Diagnosis not present

## 2023-12-22 DIAGNOSIS — R109 Unspecified abdominal pain: Secondary | ICD-10-CM | POA: Diagnosis not present

## 2023-12-22 DIAGNOSIS — C7800 Secondary malignant neoplasm of unspecified lung: Secondary | ICD-10-CM | POA: Diagnosis not present

## 2023-12-22 MED ORDER — OXYCODONE HCL ER 15 MG PO T12A
30.0000 mg | EXTENDED_RELEASE_TABLET | Freq: Two times a day (BID) | ORAL | Status: DC
  Administered 2023-12-22 – 2023-12-23 (×4): 30 mg via ORAL
  Filled 2023-12-22 (×5): qty 2

## 2023-12-22 MED ORDER — HYDRALAZINE HCL 25 MG PO TABS
25.0000 mg | ORAL_TABLET | ORAL | Status: DC | PRN
Start: 2023-12-22 — End: 2023-12-24
  Administered 2023-12-22 (×2): 25 mg via ORAL
  Filled 2023-12-22 (×2): qty 1

## 2023-12-22 MED ORDER — DIAZEPAM 2 MG PO TABS
2.0000 mg | ORAL_TABLET | Freq: Four times a day (QID) | ORAL | Status: DC | PRN
  Administered 2023-12-22: 2 mg via ORAL
  Filled 2023-12-22: qty 1

## 2023-12-22 MED ORDER — LABETALOL HCL 5 MG/ML IV SOLN
10.0000 mg | INTRAVENOUS | Status: DC | PRN
  Administered 2023-12-22 (×2): 10 mg via INTRAVENOUS
  Filled 2023-12-22 (×3): qty 4

## 2023-12-22 MED ORDER — HYDROMORPHONE HCL 2 MG PO TABS
2.0000 mg | ORAL_TABLET | ORAL | Status: DC | PRN
  Administered 2023-12-22 – 2023-12-23 (×5): 4 mg via ORAL
  Administered 2023-12-23: 2 mg via ORAL
  Filled 2023-12-22 (×7): qty 2

## 2023-12-22 NOTE — Progress Notes (Signed)
 Christopher Henson [address] [facility] Liaison Note    Referral received for Hospice services at home following discharge. Attempt made to contact sister on yesterday. Liaison will follow up with patient and family on tomorrow to coordinate.   Thank you for the opportunity to participate in this patient's care.    Nat Babe, BSN, [facility] Liaison [phone]

## 2023-12-22 NOTE — Progress Notes (Signed)
 He still having problems with pain.  We are trying to make adjustments with his pain medications.  I do appreciate everybody's help.  We did apply Lidoderm  patch to the left upper quadrant of his abdomen.  This is where most of his pain is.  His abdomen does look to be a little bit more distended.  It might be worthwhile to see if he has any ascites in there.  I think he might be eating a little bit better.  Again, his appetite still has not all that great.  He has had no nausea or vomiting.  I am not sure how much she really is out of bed.  He has had no fever.  He has had no bleeding.  He has had no problems with diarrhea.  Again, our goal here is quality of life.  I did talk to him yesterday about end-of-life issues.  He does not want to be kept alive on life support.  I totally agree with this.  I think this would be not beneficial .  I just do not think that he would ever come off life support if he were to go on.  I think he would just be a vegetable and be in a coma and it would be up to a family member to take him off which would be incredibly hard.  I just do not think that he is ready to be discharged.  I think the pain is still a problem.  We really need to try to work on this.  I know that he is somewhat stoic.  He wants to be cautious with adjustments in pain medication.  I do appreciate everybody's help.   Jeralyn Crease, MD  2 Timothy 4:6-8

## 2023-12-22 NOTE — Progress Notes (Signed)
 Yellow MEWS:  Patient noted to be yellow MEWS with elevated heart rate ( tachycardic)  and elevated blood pressure. Patient had no complaints of chest pain. Does require pain medication for chronic pain. Will continue to follow plan of care and monitor patient symptoms.

## 2023-12-22 NOTE — Progress Notes (Signed)
 Progress Note    Christopher Henson   FMW:[id number]  DOB: 11/29/1980  DOA: 12/19/2023     1 PCP: Bulah Alm RAMAN, PA-C  Initial CC: pain  Hospital Course: 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  He has been treated with radiosurgery and failed 2 lines of chemoimmunotherapy per oncology.  He has had progression of disease despite treatments.  He continues to have uncontrolled cancer associated pain mostly involving his left chest wall area. Most recently he has also undergone hospitalization at [facility] on 12/10/2023 due to mandibular abscess requiring teeth extractions and washout of oral abscess.  He was discharged home on a course of Augmentin  and doxycycline  with outpatient follow-up with ID planned also. Other PMH includes HIV/AIDS, GERD.  He presented for this hospitalization with chest pain and shortness of breath.  He has also had poor appetite and intake recently.  Hospitalization directed for involving palliative care and pain control approach.  Interval History:  Slightly more comfortable appearing but still high pain levels.  He does endorse pain level at least going down to around a 5 after medicine but wears off quickly.  Assessment and Plan: * Large cell neuroendocrine carcinoma (HCC) - Follows with Dr. Timmy - Has undergone treatment historically with radiosurgery and 2 lines of chemoimmunotherapy now with progressive disease on last CT - He is recommended for consideration of comfort measures and hospice at this time -Pain control is his biggest complaint, see separate problem -Palliative care also consulted on admission  Cancer associated pain - Previously on OxyContin  and oxycodone  at home presenting with uncontrolled pain however - Palliative care assisting with pain control at this time - Component of anxiety and insomnia also contributing which is being addressed  Goals of care,  counseling/discussion - Underlying progressive cancer now with no further palliative treatment options and patient developing failure to thrive and functional decline - Palliative care helping assist with GOC discussions and symptom control - Per 9/12 discussion with palliative care and oncology; states he would not want resuscitation; code status has been changed   HIV (human immunodeficiency virus infection) (HCC) - Continue Biktarvy  - Per [facility], CD4 count was 89 on 12/10/23 on their admission and he was started on Bactrim  for PJP prophylaxis as well  AKI (acute kidney injury) (HCC)-resolved as of 12/20/2023 - Patient endorsing poor appetite and decreased intake recently - Creatinine 1.37 on admission and has improved  Mandibular abscess - Recent hospitalization at [facility] from 12/10/2023 until 12/14/2023 -Treated for a large left mandibular abscess.  Underwent extraction of root tips #19 and 20 along with I&D of the left mandibular vestibule with placement of Penrose drain.  He was discharged on Augmentin  and doxycycline  to complete total of 14-day course from source control on 12/12/2023 and outpatient follow-up with Dr. Overton with ID -Continue Augmentin  and doxycycline   Thrombocytopenia (HCC) - Chronic and relatively stable.  Fluctuating baseline - No signs of bleeding   Old records reviewed in assessment of this patient  Antimicrobials: Augmentin  Doxycycline  Biktarvy  Bactrim   DVT prophylaxis:  SCDs Start: 12/19/23 1220   Code Status:   Code Status: Limited: Do not attempt resuscitation (DNR) -DNR-LIMITED -Do Not Intubate/DNI   Mobility Assessment (Last 72 Hours)     Mobility Assessment     Row Name 12/22/23 1312 12/21/23 2014 12/21/23 0840 12/20/23 2013 12/20/23 0800   Does the patient have exclusion criteria? No - Perform mobility assessment No - Perform mobility  assessment No - Perform mobility assessment No - Perform mobility assessment No - Perform mobility assessment   What is the  highest level of mobility based on the mobility assessment? Level 5 (Ambulates independently) - Balance while walking independently - Complete Level 5 (Ambulates independently) - Balance while walking independently - Complete Level 5 (Ambulates independently) - Balance while walking independently - Complete Level 5 (Ambulates independently) - Balance while walking independently - Complete Level 5 (Ambulates independently) - Balance while walking independently - Complete    Row Name 12/19/23 2003           Does the patient have exclusion criteria? No - Perform mobility assessment       What is the highest level of mobility based on the mobility assessment? Level 5 (Ambulates independently) - Balance while walking independently - Complete          Barriers to discharge: None Disposition Plan: Home with hospice most likely HH orders placed: ED Status is: Inpt  Objective: Blood pressure (!) 131/118, pulse (!) 117, temperature (!) 97.5 F (36.4 C), temperature source Oral, resp. rate 18, height 6' (1.829 m), weight 74.1 kg, SpO2 96%.  Examination:  Physical Exam Constitutional:      Appearance: Normal appearance.     Comments: Depressed appearing and uncomfortable appearing young adult man sitting on edge of bed  HENT:     Head: Normocephalic and atraumatic.     Mouth/Throat:     Mouth: Mucous membranes are moist.  Eyes:     Comments: Abnormal appearing R eye, but patient deferred exam  Cardiovascular:     Rate and Rhythm: Normal rate and regular rhythm.  Pulmonary:     Effort: Pulmonary effort is normal. No respiratory distress.     Breath sounds: Normal breath sounds. No wheezing.  Abdominal:     General: Bowel sounds are normal. There is no distension.     Palpations: Abdomen is soft.     Tenderness: There is no abdominal tenderness.  Musculoskeletal:        General: Normal range of motion.     Cervical back: Normal range of motion and neck supple.  Skin:    General: Skin is  warm and dry.  Neurological:     General: No focal deficit present.  Psychiatric:        Mood and Affect: Mood normal.        Behavior: Behavior normal.      Consultants:  Palliative care Oncology  Procedures:    Data Reviewed: No results found for this or any previous visit (from the past 24 hours).   I have reviewed pertinent nursing notes, vitals, labs, and images as necessary. I have ordered labwork to follow up on as indicated.  I have reviewed the last notes from staff over past 24 hours. I have discussed patient's care plan and test results with nursing staff, CM/SW, and other staff as appropriate.    LOS: 1 day   Alm Apo, MD [facility] 12/22/2023, 6:00 PM

## 2023-12-22 NOTE — Plan of Care (Signed)

## 2023-12-22 NOTE — Progress Notes (Signed)
 Daily Progress Note   Patient Name: Christopher Henson       Date: 12/22/2023 DOB: 08-14-1980  Age: 43 y.o. MRN#: [id number] Attending Physician: Patsy Lenis, MD Primary Care Physician: Bulah Lenis RAMAN, PA-C Admit Date: 12/19/2023  Reason for Consultation/Follow-up: Establishing goals of care, Non pain symptom management, and Pain control  Patient Profile/HPI:  Patient is a 43 y.o. 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 CTA showed progression of cancer. Palliative medicine consulted for symptom management and goals of care.   Subjective: Chart reviewed including labs, progress notes, imaging from this and previous encounters.  Discussed with bedside RN.  On eval patient appears in better spirits than when I saw him in the ED. [place] required 8mg  IV dilaudid  over last 24 hours. Has not taken any po dilaudid . No po lorazepam .  He reports continued pain. He is afraid of taking too many pills at one time- he worries it will make him nauseated.  He wishes for better control of pain. He is also having a hard time sleeping. Too much on his mind. He tried the lorazepam  but feels it did not last long enough.  We discussed increasing his oxycontin  and he is agreeable to this.  We discussed needing to use oral hydromorphone - encouraged him to take this early when he feels pain increasing and not wait until pain is severe. Noted that the oral preparation will last longer than the IV.  We discussed changing lorazepam  to valium  for a more long  lasting effect and to help with sleep. He is agreeable to this.  He had two bowel movements this morning and notes he felt better after having them. We discussed the need to continue bowel regimen to prevent constipation while he is on opioid medications. He doesn't have an appetite- no difficulty swallowing or mouth pain. Just doesn't feel like eating. We discussed using mirtazapine for appetite and sleep- however he doesn't want to add another medicine right now.   Review of Systems  Cardiovascular:  Positive for chest pain.  Gastrointestinal:        Decreased appetite  Psychiatric/Behavioral:  The patient is nervous/anxious.      Physical Exam          Vital Signs: BP (!) 167/126 (BP Location: Right Arm)   Pulse (!) 114   Temp 97.7 F (36.5 C) (Oral)   Resp 18   Ht 6' (1.829 m)   Wt 74.1 kg   SpO2 98%   BMI 22.16 kg/m  SpO2: SpO2: 98 % O2 Device: O2 Device: Room Air O2 Flow Rate:    Intake/output summary:  Intake/Output Summary (Last 24 hours) at 12/22/2023 1150 Last data filed at 12/21/2023 1823 Gross per 24 hour  Intake 240 ml  Output --  Net 240 ml   LBM:   Baseline Weight: Weight: 74.1 kg Most recent weight: Weight: 74.1 kg       Palliative Assessment/Data: PPS: 50%      Patient Active Problem List   Diagnosis Date Noted   Constipation 12/21/2023   Nausea 12/21/2023   AKI (acute kidney injury) (HCC) 12/21/2023   Cancer associated pain 12/20/2023   Goals of care, counseling/discussion 12/20/2023   Metastatic lung carcinoma, unspecified laterality (HCC) 12/20/2023   Anxiety 12/20/2023   Counseling and coordination of care 12/20/2023   ACP (advance care planning) 12/20/2023   DNR (do not resuscitate) discussion 12/20/2023   High risk medication use 12/20/2023   Medication management 12/20/2023   Palliative care encounter 12/20/2023   Facial cellulitis 12/10/2023   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) 12/10/2023   Large  cell neuroendocrine carcinoma (HCC) 01/15/2023   History of homeless 01/09/2023   Essential hypertension, benign 01/09/2023   Mediastinal mass 12/18/2022   Alcoholism (HCC) 12/18/2022   Smoker 12/18/2022   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   Encounter for long-term (current) use of high-risk medication 06/08/2022   Mandibular abscess 11/28/2019   Thrombocytopenia (HCC) 02/25/2019   Elevated liver enzymes 02/25/2019   Alcohol use 02/21/2019   Depression 12/10/2018   Poor dentition 10/03/2018   HIV (human immunodeficiency virus infection) (HCC) 09/05/2018   Protein-calorie malnutrition, severe 06/10/2018   History of cryptococcal meningitis     Palliative Care Assessment & Plan    Assessment/Recommendations/Plan  Cancer related pain- increase oxycontin  to 30mg  po BID, continue po hydromorphone  and IV hydromorphone  for breakthrough pain- encouraged to use po before IV, discussed with bedside RN Change lorazepam  to valium  po prn Continue senna 2 po bid for bowel prophylaxis Plan for home with hospice   Code Status:   Code Status: Limited: Do not attempt resuscitation (DNR) -DNR-LIMITED -Do Not Intubate/DNI    Prognosis:  < 6 months  Discharge Planning: Home with Hospice  Care plan was discussed with patient and bedside RN.   Thank you for allowing the Palliative Medicine Team to assist in the care of this patient.  Total time:  Prolonged billing:  Time includes:   Preparing to see the patient (e.g., review of tests) Obtaining and/or reviewing separately obtained history Performing a medically necessary appropriate examination and/or evaluation Counseling and educating the patient/family/caregiver Ordering medications, tests, or procedures Referring and communicating with other health care professionals (when not reported separately) Documenting clinical information in the electronic or other health record Independently interpreting results (not reported  separately) and communicating results to the patient/family/caregiver Care coordination (not reported separately) Clinical documentation  Cassondra Stain, AGNP-C Palliative Medicine   Please contact Palliative Medicine Team phone at [phone] for questions and concerns.

## 2023-12-23 DIAGNOSIS — Z79899 Other long term (current) drug therapy: Secondary | ICD-10-CM | POA: Diagnosis not present

## 2023-12-23 DIAGNOSIS — Z515 Encounter for palliative care: Secondary | ICD-10-CM | POA: Diagnosis not present

## 2023-12-23 DIAGNOSIS — B20 Human immunodeficiency virus [HIV] disease: Secondary | ICD-10-CM | POA: Diagnosis not present

## 2023-12-23 DIAGNOSIS — K5900 Constipation, unspecified: Secondary | ICD-10-CM | POA: Diagnosis not present

## 2023-12-23 DIAGNOSIS — Z66 Do not resuscitate: Secondary | ICD-10-CM

## 2023-12-23 DIAGNOSIS — K5903 Drug induced constipation: Secondary | ICD-10-CM | POA: Diagnosis not present

## 2023-12-23 DIAGNOSIS — C7800 Secondary malignant neoplasm of unspecified lung: Secondary | ICD-10-CM | POA: Diagnosis not present

## 2023-12-23 DIAGNOSIS — G893 Neoplasm related pain (acute) (chronic): Secondary | ICD-10-CM | POA: Diagnosis not present

## 2023-12-23 DIAGNOSIS — C7A1 Malignant poorly differentiated neuroendocrine tumors: Secondary | ICD-10-CM | POA: Diagnosis not present

## 2023-12-23 DIAGNOSIS — Z7189 Other specified counseling: Secondary | ICD-10-CM | POA: Diagnosis not present

## 2023-12-23 DIAGNOSIS — F419 Anxiety disorder, unspecified: Secondary | ICD-10-CM | POA: Diagnosis not present

## 2023-12-23 DIAGNOSIS — R110 Nausea: Secondary | ICD-10-CM | POA: Diagnosis not present

## 2023-12-23 MED ORDER — HYDROMORPHONE HCL 4 MG PO TABS
8.0000 mg | ORAL_TABLET | ORAL | Status: DC | PRN
  Administered 2023-12-23 – 2023-12-24 (×3): 8 mg via ORAL
  Filled 2023-12-23 (×3): qty 2

## 2023-12-23 MED ORDER — GABAPENTIN 100 MG PO CAPS
200.0000 mg | ORAL_CAPSULE | Freq: Three times a day (TID) | ORAL | Status: DC
  Administered 2023-12-23 – 2023-12-24 (×3): 200 mg via ORAL
  Filled 2023-12-23 (×3): qty 2

## 2023-12-23 MED ORDER — SENNA 8.6 MG PO TABS
3.0000 | ORAL_TABLET | Freq: Two times a day (BID) | ORAL | Status: DC
  Administered 2023-12-23: 25.8 mg via ORAL
  Filled 2023-12-23 (×2): qty 3

## 2023-12-23 NOTE — Progress Notes (Signed)
 Daily Progress Note   Patient Name: Christopher Henson       Date: 12/23/2023 DOB: [birth year]  Age: 43 y.o. MRN#: [id number] Attending Physician: Christopher Lenis, MD Primary Care Physician: Christopher Henson RAMAN, PA-C Admit Date: 12/19/2023 Length of Stay: 2 days  Reason for Consultation/Follow-up: Establishing goals of care and symptom management  Subjective:   CC: Continuing to adjust with patient's pain.  Following up regarding complex medical decision making and symptom management.  Subjective:  Reviewed EMR including recent documentation from [facility] and hospitalist. At time of EMR review in past 24 hours patient has received as needed p.o. Dilaudid  4 mg x 5 doses and 2 mg x 1 dose.  Patient has also received as needed IV Dilaudid  2 mg x 1 dose and 1 mg x 1 dose.  Patient's OxyContin  was increased to 30 mg every 12 hours on 12/22/2023 with patient's permission.  Patient continues to receive gabapentin  100 mg every 8 hours during the day and dexamethasone  4 mg daily. Noted patient had bowel movement yesterday.  Presented to bedside to see patient.  Visitors present at bedside, patient allowing for discussion at that time.  Able to follow-up regarding patient's symptom management.  Patient's pain is still present.  Patient does not feel the oral oxycodone  4 mg dose assist with pain management.  Denies any adverse effects from current dose of oral Dilaudid .  Noted would increase oral Dilaudid  to 8 mg every 4 hours as needed since continuing to have severe pain.  Continuing OxyContin  30 mg every 12 hours during the day.  Also increasing gabapentin  to 200 mg every 8 hours during the day. Patient does feel his nausea and constipation are improved after a bowel movement yesterday.  Continuing Reglan  5 mg 3 times daily and senna 3 tabs twice daily.  All questions answered at that time.  Noted palliative medicine team to continue to follow in patient's medical journey.  Discussed care with hospitalist, RN, TOC,  and ACC liaison.  Coordinating to get patient home with [facility] hospice support. Objective:   Vital Signs:  BP (!) 124/99 (BP Location: Right Arm)   Pulse (!) 109   Temp 97.7 F (36.5 C) (Oral)   Resp 18   Ht 6' (1.829 m)   Wt 74.1 kg   SpO2 96%   BMI 22.16 kg/m   Physical Exam: General: NAD, alert, cachectic, frail Cardiovascular: Tachycardia noted Respiratory: no increased work of breathing noted, not in respiratory distress Neuro: A&Ox4, following commands easily Psych: appropriately answers all questions, stoic  Assessment & Plan:   Assessment: Patient is a 43 y.o. 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 CTA showed progression of cancer. Palliative medicine consulted for symptom management and goals of care.   Recommendations/Plan: # Complex medical decision making/goals of care:  - Discussed care with patient as detailed above in HPI. Patient is not a candidate for further cancer directed therapies as per oncology.  Now focusing on getting patient home with hospice support.  Continuing symptom management while in the hospital.  [facility] and [facility] liaison coordinating discharge planning.  Would need same-day admission to hospice once discharged.  Palliative medicine team will continue to follow along to engage in conversations as able and appropriate.  -  Code Status: Limited:  Do not attempt resuscitation (DNR) -DNR-LIMITED -Do Not Intubate/DNI   # Symptom management: Patient is receiving these palliative interventions for symptom management with an intent to improve quality of life.   - Pain/dyspnea, in setting of metastatic neuroendocrine cancer   - Continue OxyContin  30 mg every 12 hours scheduled   - Increase oral  Dilaudid  to 8mg  tablet every 4 hours as needed   - Continue IV Dilaudid  to 1-2 every 4 hours as needed breakthrough after oral Dilaudid    - Increase gabapentin  to 200 mg every 8 hours during the day   - Continue Tylenol  650 3 times daily   - Continue oral dexamethasone  4 mg daily   - Anxiety, in setting of metastatic neuroendocrine cancer, improved   - Continue Valium  2 mg tab every 6 hours as needed   - Constipation, improved   - Continue senna 3 tabs twice daily   - Nausea/vomiting, improving   - Reviewed EKG on 12/19/2023 noting QTc 446   - Continue Reglan  5 mg 3 times daily daily before meals   - Receiving steroids as well  # Psychosocial Support:  - Sister- Christopher Henson  # Discharge Planning: Home with Hospice  Discussed with: Hospitalist, RN, patient, TOC, ACC liaison  Thank you for allowing the palliative care team to participate in the care Christopher Henson.  Tinnie Radar, DO Palliative Care Provider PMT # [phone]  If patient remains symptomatic despite maximum doses, please call PMT at [phone] between 0700 and 1900. Outside of these hours, please call attending, as PMT does not have night coverage.

## 2023-12-23 NOTE — Progress Notes (Signed)
 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  He has been treated with radiosurgery and failed 2 lines of chemoimmunotherapy per oncology.  He has had progression of disease despite treatments.  He continues to have uncontrolled cancer associated pain mostly involving his left chest wall area. Most recently he has also undergone hospitalization at [facility] on 12/10/2023 due to mandibular abscess requiring teeth extractions and washout of oral abscess.  He was discharged home on a course of Augmentin  and doxycycline  with outpatient follow-up with ID planned also. Other PMH includes HIV/AIDS, GERD.  He presented for this hospitalization with chest pain and shortness of breath.  He has also had poor appetite and intake recently.  Hospitalization directed for involving palliative care and pain control approach.  Interval History:  No events overnight.  Wife at bedside this morning.  Seems to be doing a little bit better.  Regimen being further adjusted by palliative care.  Tentative plan for discharge home tomorrow.  Assessment and Plan: * Large cell neuroendocrine carcinoma (HCC) - Follows with Dr. Timmy - Has undergone treatment historically with radiosurgery and 2 lines of chemoimmunotherapy now with progressive disease on last CT - He is recommended for consideration of comfort measures and hospice at this time -Pain control is his biggest complaint, see separate problem -Palliative care also consulted on admission  Cancer associated pain - Previously on OxyContin  and oxycodone  at home presenting with uncontrolled pain however - Palliative care assisting with pain control at this time - Component of anxiety and insomnia also contributing which is being addressed  Goals of  care, counseling/discussion - Underlying progressive cancer now with no further palliative treatment options and patient developing failure to thrive and functional decline - Palliative care helping assist with GOC discussions and symptom control - Per 9/12 discussion with palliative care and oncology; states he would not want resuscitation; code status has been changed   HIV (human immunodeficiency virus infection) (HCC) - Continue Biktarvy  - Per [facility], CD4 count was 89 on 12/10/23 on their admission and he was started on Bactrim  for PJP prophylaxis as well  AKI (acute kidney injury) (HCC)-resolved as of 12/20/2023 - Patient endorsing poor appetite and decreased intake recently - Creatinine 1.37 on admission and has improved  Mandibular abscess - Recent hospitalization at [facility] from 12/10/2023 until 12/14/2023 -Treated for a large left mandibular abscess.  Underwent extraction of root tips #19 and 20 along with I&D of the left mandibular vestibule with placement of Penrose drain.  He was discharged on Augmentin  and doxycycline  to complete total of 14-day course from source control on 12/12/2023 and outpatient follow-up with Dr. Overton with ID -Continue Augmentin  and doxycycline  until course complete   Thrombocytopenia (HCC) - Chronic and relatively stable.  Fluctuating baseline - No signs of bleeding   Old records reviewed in assessment of this patient  Antimicrobials: Augmentin  Doxycycline  Biktarvy  Bactrim   DVT prophylaxis:  SCDs Start: 12/19/23 1220   Code Status:   Code Status: Limited: Do not attempt resuscitation (DNR) -DNR-LIMITED -Do Not Intubate/DNI   Mobility Assessment (Last 72 Hours)     Mobility Assessment     Row Name 12/22/23 1931 12/22/23 1312 12/21/23 2014 12/21/23 0840 12/20/23 2013   Does the patient have exclusion criteria? No -  Perform mobility assessment No - Perform mobility assessment No - Perform mobility assessment No - Perform mobility assessment No - Perform  mobility assessment   What is the highest level of mobility based on the mobility assessment? Level 5 (Ambulates independently) - Balance while walking independently - Complete Level 5 (Ambulates independently) - Balance while walking independently - Complete Level 5 (Ambulates independently) - Balance while walking independently - Complete Level 5 (Ambulates independently) - Balance while walking independently - Complete Level 5 (Ambulates independently) - Balance while walking independently - Complete      Barriers to discharge: None Disposition Plan: Home with hospice most likely HH orders placed:  Status is: Inpt  Objective: Blood pressure (!) 123/90, pulse (!) 110, temperature 98 F (36.7 C), resp. rate 17, height 6' (1.829 m), weight 74.1 kg, SpO2 96%.  Examination:  Physical Exam Constitutional:      Appearance: Normal appearance.     Comments: Depressed appearing and uncomfortable appearing young adult man sitting on edge of bed  HENT:     Head: Normocephalic and atraumatic.     Mouth/Throat:     Mouth: Mucous membranes are moist.  Eyes:     Comments: Abnormal appearing R eye, but patient deferred exam  Cardiovascular:     Rate and Rhythm: Normal rate and regular rhythm.  Pulmonary:     Effort: Pulmonary effort is normal. No respiratory distress.     Breath sounds: Normal breath sounds. No wheezing.  Abdominal:     General: Bowel sounds are normal. There is no distension.     Palpations: Abdomen is soft.     Tenderness: There is no abdominal tenderness.  Musculoskeletal:        General: Normal range of motion.     Cervical back: Normal range of motion and neck supple.  Skin:    General: Skin is warm and dry.  Neurological:     General: No focal deficit present.  Psychiatric:        Mood and Affect: Mood normal.        Behavior: Behavior normal.      Consultants:  Palliative care Oncology  Procedures:    Data Reviewed: No results found for this or any  previous visit (from the past 24 hours).   I have reviewed pertinent nursing notes, vitals, labs, and images as necessary. I have ordered labwork to follow up on as indicated.  I have reviewed the last notes from staff over past 24 hours. I have discussed patient's care plan and test results with nursing staff, CM/SW, and other staff as appropriate.    LOS: 2 days   Alm Apo, MD [facility] 12/23/2023, 3:36 PM

## 2023-12-23 NOTE — Progress Notes (Signed)
 Christopher Henson [facility] Liaison Note    Received request from Tinnie Radar ,DO ([facility], Transitions of Care Manager included), for hospice services at home after discharge. Spoke with patient to initiate education related to hospice philosophy, services, and team approach to care. Patient verbalized understanding of information given. Per discussion, the plan is for discharge home possibly on Monday. Patient uncertain if transport will be provided via hospital or if family will be able to get him.    DME needs discussed. Patient has no needs at this time. Provided education that Admission Nurse is also able to discuss equipment needs, should patient have any following discharge.      Please send signed and completed DNR home with patient/family. Please provide prescriptions at discharge as needed to ensure ongoing symptom management.    [facility] information and contact numbers given to patient.   Above information shared with [facility], Transitions of Care Manager and care team.    Please call with any questions or concerns.    Thank you for the opportunity to participate in this patient's care.    Nat Babe, BSN, [facility] Liaison [phone]

## 2023-12-23 NOTE — Progress Notes (Signed)
 Resources added to AVS.

## 2023-12-23 NOTE — Plan of Care (Signed)
  Problem: Education: Goal: Knowledge of General Education information will improve Description: Including pain rating scale, medication(s)/side effects and non-pharmacologic comfort measures Outcome: Progressing   Problem: Health Behavior/Discharge Planning: Goal: Ability to manage health-related needs will improve Outcome: Progressing   Problem: Clinical Measurements: Goal: Ability to maintain clinical measurements within normal limits will improve Outcome: Progressing Goal: Diagnostic test results will improve Outcome: Progressing Goal: Respiratory complications will improve Outcome: Progressing   Problem: Activity: Goal: Risk for activity intolerance will decrease Outcome: Progressing   Problem: Coping: Goal: Level of anxiety will decrease Outcome: Progressing   Problem: Elimination: Goal: Will not experience complications related to urinary retention Outcome: Progressing   Problem: Pain Managment: Goal: General experience of comfort will improve and/or be controlled Outcome: Progressing   Problem: Safety: Goal: Ability to remain free from injury will improve Outcome: Progressing   Problem: Skin Integrity: Goal: Risk for impaired skin integrity will decrease Outcome: Progressing

## 2023-12-24 ENCOUNTER — Telehealth: Payer: Self-pay | Admitting: *Deleted

## 2023-12-24 ENCOUNTER — Ambulatory Visit

## 2023-12-24 DIAGNOSIS — Z515 Encounter for palliative care: Secondary | ICD-10-CM

## 2023-12-24 DIAGNOSIS — C7A1 Malignant poorly differentiated neuroendocrine tumors: Secondary | ICD-10-CM | POA: Diagnosis not present

## 2023-12-24 DIAGNOSIS — Z7189 Other specified counseling: Secondary | ICD-10-CM | POA: Diagnosis not present

## 2023-12-24 DIAGNOSIS — G893 Neoplasm related pain (acute) (chronic): Secondary | ICD-10-CM | POA: Diagnosis not present

## 2023-12-24 LAB — COMPREHENSIVE METABOLIC PANEL WITH GFR
ALT: 1429 U/L — ABNORMAL HIGH (ref 0–44)
AST: 4553 U/L — ABNORMAL HIGH (ref 15–41)
Albumin: 3.2 g/dL — ABNORMAL LOW (ref 3.5–5.0)
Alkaline Phosphatase: 619 U/L — ABNORMAL HIGH (ref 38–126)
Anion gap: 19 — ABNORMAL HIGH (ref 5–15)
BUN: 63 mg/dL — ABNORMAL HIGH (ref 6–20)
CO2: 16 mmol/L — ABNORMAL LOW (ref 22–32)
Calcium: 8.8 mg/dL — ABNORMAL LOW (ref 8.9–10.3)
Chloride: 84 mmol/L — ABNORMAL LOW (ref 98–111)
Creatinine, Ser: 3.11 mg/dL — ABNORMAL HIGH (ref 0.61–1.24)
GFR, Estimated: 24 mL/min — ABNORMAL LOW (ref >60)
Glucose, Bld: 82 mg/dL (ref 70–99)
Potassium: >7.5 mmol/L (ref 3.5–5.1)
Sodium: 119 mmol/L — CL (ref 135–145)
Total Bilirubin: 5.1 mg/dL — ABNORMAL HIGH (ref 0.0–1.2)
Total Protein: 5.8 g/dL — ABNORMAL LOW (ref 6.5–8.1)

## 2023-12-24 LAB — AMMONIA: Ammonia: 39 umol/L — ABNORMAL HIGH (ref 9–35)

## 2023-12-24 LAB — PREALBUMIN: Prealbumin: 15 mg/dL — ABNORMAL LOW (ref 18–38)

## 2023-12-24 MED ORDER — GLYCOPYRROLATE 0.2 MG/ML IJ SOLN: 0.2000 mg | INTRAMUSCULAR | Status: DC | PRN

## 2023-12-24 MED ORDER — ONDANSETRON 4 MG PO TBDP: 4.0000 mg | ORAL_TABLET | Freq: Four times a day (QID) | ORAL | Status: DC | PRN

## 2023-12-24 MED ORDER — HYDROMORPHONE HCL-NACL 50-0.9 MG/50ML-% IV SOLN
1.0000 mg/h | INTRAVENOUS | Status: DC
  Administered 2023-12-24: 1 mg/h via INTRAVENOUS
  Filled 2023-12-24: qty 50

## 2023-12-24 MED ORDER — HYDROMORPHONE HCL-NACL 50-0.9 MG/50ML-% IV SOLN: 1.0000 mg/h | INTRAVENOUS | Status: DC

## 2023-12-24 MED ORDER — LORAZEPAM 2 MG/ML IJ SOLN: 1.0000 mg | INTRAMUSCULAR | Status: DC | PRN

## 2023-12-24 MED ORDER — LORAZEPAM 2 MG/ML PO CONC: 1.0000 mg | ORAL | Status: DC | PRN

## 2023-12-24 MED ORDER — GLYCOPYRROLATE 1 MG PO TABS: 1.0000 mg | ORAL_TABLET | ORAL | Status: DC | PRN

## 2023-12-24 MED ORDER — ONDANSETRON HCL 4 MG/2ML IJ SOLN: 4.0000 mg | Freq: Four times a day (QID) | INTRAMUSCULAR | Status: DC | PRN

## 2023-12-24 MED ORDER — HYDROMORPHONE BOLUS VIA INFUSION
0.5000 mg | INTRAVENOUS | Status: DC | PRN
  Administered 2023-12-24 (×2): 0.5 mg via INTRAVENOUS

## 2023-12-24 MED ORDER — HYDROMORPHONE HCL 1 MG/ML IJ SOLN: 0.5000 mg | INTRAMUSCULAR | Status: DC | PRN

## 2023-12-24 MED ORDER — HYDROMORPHONE HCL 4 MG PO TABS: 4.0000 mg | ORAL_TABLET | ORAL | Status: DC | PRN

## 2023-12-24 MED ORDER — LORAZEPAM 1 MG PO TABS: 1.0000 mg | ORAL_TABLET | ORAL | Status: DC | PRN

## 2023-12-24 MED ORDER — HYDROMORPHONE HCL 4 MG PO TABS: 8.0000 mg | ORAL_TABLET | ORAL | Status: DC | PRN

## 2023-12-24 MED ORDER — OXYCODONE HCL ER 15 MG PO T12A: 15.0000 mg | EXTENDED_RELEASE_TABLET | Freq: Two times a day (BID) | ORAL | Status: DC

## 2023-12-24 MED ORDER — POLYVINYL ALCOHOL 1.4 % OP SOLN: 1.0000 [drp] | Freq: Four times a day (QID) | OPHTHALMIC | Status: DC | PRN

## 2023-12-25 ENCOUNTER — Other Ambulatory Visit: Payer: Self-pay | Admitting: Medical Oncology

## 2023-12-25 ENCOUNTER — Encounter: Payer: Self-pay | Admitting: *Deleted

## 2023-12-25 ENCOUNTER — Other Ambulatory Visit (HOSPITAL_BASED_OUTPATIENT_CLINIC_OR_DEPARTMENT_OTHER): Payer: Self-pay

## 2023-12-25 LAB — CBC WITH DIFFERENTIAL/PLATELET
Abs Immature Granulocytes: 0.43 K/uL — ABNORMAL HIGH (ref 0.00–0.07)
Basophils Absolute: 0 K/uL (ref 0.0–0.1)
Basophils Relative: 1 %
Eosinophils Absolute: 0.1 K/uL (ref 0.0–0.5)
Eosinophils Relative: 1 %
HCT: 36 % — ABNORMAL LOW (ref 39.0–52.0)
Hemoglobin: 12.6 g/dL — ABNORMAL LOW (ref 13.0–17.0)
Immature Granulocytes: 7 %
Lymphocytes Relative: 9 %
Lymphs Abs: 0.6 K/uL — ABNORMAL LOW (ref 0.7–4.0)
MCH: 32.3 pg (ref 26.0–34.0)
MCHC: 35 g/dL (ref 30.0–36.0)
MCV: 92.3 fL (ref 80.0–100.0)
Monocytes Absolute: 0.8 K/uL (ref 0.1–1.0)
Monocytes Relative: 11 %
Neutro Abs: 4.8 K/uL (ref 1.7–7.7)
Neutrophils Relative %: 71 %
Platelets: 15 K/uL — CL (ref 150–400)
RBC: 3.9 MIL/uL — ABNORMAL LOW (ref 4.22–5.81)
RDW: 16.9 % — ABNORMAL HIGH (ref 11.5–15.5)
Smear Review: NORMAL
WBC: 6.6 K/uL (ref 4.0–10.5)
nRBC: 4.4 % — ABNORMAL HIGH (ref 0.0–0.2)

## 2023-12-25 NOTE — Progress Notes (Signed)
 Oncology Nurse Navigator Documentation     12/25/2023    8:00 AM  Oncology Nurse Navigator Flowsheets  Navigation Complete Date: 12/25/2023  Post Navigation: Continue to Follow Patient? No  Reason Not Navigating Patient: Hospice/Death  Navigator Location [facility]  Time Spent with Patient 15

## 2023-12-25 NOTE — Radiation Completion Notes (Addendum)
  Radiation Oncology         [phone] ________________________________  Name: Christopher Henson MRN: [id number]  Date of Service: 12/07/2023  DOB: 13-Aug-1980  End of Treatment Note  Diagnosis:  Stage IV (cT4, N2, M1a) large cell neuroendocrine carcinoma  Intent: Palliative     ==========DELIVERED PLANS==========  First Treatment Date: 2023-11-21 Last Treatment Date: 2023-12-07   Plan Name: Brain_HA Site: Brain Technique: IMRT Mode: Photon Dose Per Fraction: 2.5 Gy Prescribed Dose (Delivered / Prescribed): 30 Gy / 35 Gy Prescribed Fxs (Delivered / Prescribed): 12 / 14     ====================================  Patient reported significant pain on the left side of his abdomen/chest along with shortness of breath on 12/07/2023. He was unable to complete full radiation treatment due to symptoms and decided he would like to proceed with Hospice care.   Radiation follow-up PRN. We appreciate the opportunity to take part in this patient's care.      Ronita Due, PA-C

## 2023-12-28 ENCOUNTER — Inpatient Hospital Stay

## 2023-12-28 ENCOUNTER — Other Ambulatory Visit

## 2023-12-28 ENCOUNTER — Ambulatory Visit: Admitting: Hematology & Oncology

## 2024-01-01 ENCOUNTER — Telehealth: Payer: Self-pay | Admitting: Family Medicine

## 2024-01-01 NOTE — Telephone Encounter (Signed)
Sympathy Card Sent

## 2024-01-02 ENCOUNTER — Inpatient Hospital Stay

## 2024-01-02 ENCOUNTER — Ambulatory Visit

## 2024-01-02 ENCOUNTER — Ambulatory Visit: Admitting: Hematology & Oncology

## 2024-01-02 ENCOUNTER — Other Ambulatory Visit

## 2024-01-03 DIAGNOSIS — C3492 Malignant neoplasm of unspecified part of left bronchus or lung: Secondary | ICD-10-CM | POA: Diagnosis not present

## 2024-01-03 DIAGNOSIS — B451 Cerebral cryptococcosis: Secondary | ICD-10-CM | POA: Diagnosis not present

## 2024-01-03 DIAGNOSIS — J9859 Other diseases of mediastinum, not elsewhere classified: Secondary | ICD-10-CM | POA: Diagnosis not present

## 2024-01-04 NOTE — Addendum Note (Signed)
 Encounter addended by: Wyatt Leeroy HERO, PA-C on: 01/04/2024 1:12 PM  Actions taken: Clinical Note Signed

## 2024-01-07 ENCOUNTER — Ambulatory Visit: Admitting: Hematology & Oncology

## 2024-01-07 ENCOUNTER — Other Ambulatory Visit

## 2024-01-07 ENCOUNTER — Inpatient Hospital Stay

## 2024-01-07 ENCOUNTER — Ambulatory Visit

## 2024-01-09 NOTE — Progress Notes (Signed)
 Patient without heartbeat and respirations at 1444, death pronounced by 2 RN's, MD made aware, family at bedside

## 2024-01-09 NOTE — Progress Notes (Signed)
 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  He has been treated with radiosurgery and failed 2 lines of chemoimmunotherapy per oncology.  He has had progression of disease despite treatments.  He continues to have uncontrolled cancer associated pain mostly involving his left chest wall area. Most recently he has also undergone hospitalization at [facility] on 12/10/2023 due to mandibular abscess requiring teeth extractions and washout of oral abscess.  He was discharged home on a course of Augmentin  and doxycycline  with outpatient follow-up with ID planned also. Other PMH includes HIV/AIDS, GERD.  He presented for this hospitalization with chest pain and shortness of breath.  He has also had poor appetite and intake recently.  Hospitalization directed for involving palliative care and pain control approach. He was started on pain control with palliative care assistance. Tentative plan was for discharging home with hospice however he had rapid decline while in the hospital and care was transitioned to comfort care approach inpatient with in-hospital death anticipated.  Interval History:  Patient became more confused and lethargic since yesterday.  Having difficulty answering questions this morning from confusion and also noting some shaking/tremoring in his hands. Labs checked by oncology and show multiorgan system failure.  Called and discussed with Christopher Henson and Christopher Henson to update.  Plan of care also discussed with oncology, palliative care, hospice.  We will keep patient in the hospital and focus on comfort with Dilaudid  drip and anticipate in-hospital death.  Assessment and Plan: * Large cell neuroendocrine carcinoma (HCC) - Follows with Dr. Timmy - Has undergone treatment  historically with radiosurgery and 2 lines of chemoimmunotherapy now with progressive disease on last CT - He is recommended for consideration of comfort measures and hospice at this time -Pain control was main focus on admission, see separate problem -Palliative care also consulted on admission  End of life care - Initial plan was discharging home with hospice to continue pain control and focus on comfort care approach -Acute worsening on December 30, 2023 with confusion, lethargy, tremors.  Labs show multi organ failure with shock liver, renal failure, hyperammonemia -Family updated along with rest of care team -Plan transitioning to inpatient anticipated death -Dilaudid  drip started; titrate for comfort  Goals of care, counseling/discussion - Underlying progressive cancer now with no further palliative treatment options and patient developing failure to thrive and functional decline - Palliative care helping assist with GOC discussions and symptom control - Per 9/12 discussion with palliative care and oncology; states he would not want resuscitation; code status has been changed  - developed acute worsening 12/30/2023, with [facility]; care transitioned to pure comfort in hospital; in-hospital death anticipated   Cancer associated pain - Previously on OxyContin  and oxycodone  at home presenting with uncontrolled pain however - Palliative care assisting with pain control - Component of anxiety and insomnia  HIV (human immunodeficiency virus infection) (HCC) - Continue Biktarvy  - Per [facility], CD4 count was 89 on 12/10/23 on their admission and he was started on Bactrim  for PJP prophylaxis as well  AKI (acute kidney injury) (HCC)-resolved as of 12/20/2023 - Patient endorsing poor appetite and decreased intake recently - Creatinine 1.37 on admission and had improved; acutely worsened Dec 30, 2023  Mandibular abscess - Recent hospitalization at [facility] from 12/10/2023 until 12/14/2023 -Treated for a  large left mandibular  abscess.  Underwent extraction of root tips #19 and 20 along with I&D of the left mandibular vestibule with placement of Penrose drain.  He was discharged on Augmentin  and doxycycline  to complete total of 14-day course from source control on 12/12/2023 and outpatient follow-up with Dr. Overton with ID - Continued on Augmentin  and doxycycline ; course considered complete once having to transition to pure comfort  Thrombocytopenia (HCC) - Chronic and relatively stable.  Fluctuating baseline - No signs of bleeding   Old records reviewed in assessment of this patient  Antimicrobials: Augmentin  Doxycycline  Biktarvy  Bactrim   DVT prophylaxis:  SCDs Start: 12/19/23 1220   Code Status:   Code Status: Do not attempt resuscitation (DNR) - Comfort care  Mobility Assessment (Last 72 Hours)     Mobility Assessment     Row Name 01/02/2024 0753 12/23/23 2000 12/22/23 1931 12/22/23 1312 12/21/23 2014   Does the patient have exclusion criteria? No - Perform mobility assessment No - Perform mobility assessment No - Perform mobility assessment No - Perform mobility assessment No - Perform mobility assessment   What is the highest level of mobility based on the mobility assessment? Level 5 (Ambulates independently) - Balance while walking independently - Complete Level 5 (Ambulates independently) - Balance while walking independently - Complete Level 5 (Ambulates independently) - Balance while walking independently - Complete Level 5 (Ambulates independently) - Balance while walking independently - Complete Level 5 (Ambulates independently) - Balance while walking independently - Complete      Barriers to discharge: None Disposition Plan: Inpatient death expected Status is: Inpt  Objective: Blood pressure 98/78, pulse 99, temperature (!) 97.5 F (36.4 C), temperature source Oral, resp. rate 14, height 6' (1.829 m), weight 74.1 kg, SpO2 96%.  Examination:  Physical Exam Constitutional:      Appearance:  Normal appearance.     Comments: Much more confused and lethargic this morning  HENT:     Head: Normocephalic and atraumatic.     Mouth/Throat:     Mouth: Mucous membranes are moist.  Eyes:     Comments: Abnormal appearing R eye; exam deferred in setting of comfort care  Cardiovascular:     Rate and Rhythm: Normal rate and regular rhythm.  Pulmonary:     Effort: Pulmonary effort is normal. No respiratory distress.     Breath sounds: Normal breath sounds. No wheezing.  Abdominal:     General: Bowel sounds are normal. There is no distension.     Palpations: Abdomen is soft.     Tenderness: There is no abdominal tenderness.  Musculoskeletal:        General: Normal range of motion.     Cervical back: Normal range of motion and neck supple.  Skin:    General: Skin is warm and dry.  Neurological:     Comments: Much more confused and speech more slurred; unable to follow commands adequately  Psychiatric:        Mood and Affect: Mood normal.        Behavior: Behavior normal.      Consultants:  Palliative care Oncology  Procedures:    Data Reviewed: Results for orders placed or performed during the hospital encounter of 12/19/23 (from the past 24 hours)  CBC with Differential/Platelet     Status: Abnormal   Collection Time: 2024/01/02 10:15 AM  Result Value Ref Range   WBC 6.6 4.0 - 10.5 K/uL   RBC 3.90 (L) 4.22 - 5.81 MIL/uL   Hemoglobin 12.6 (  L) 13.0 - 17.0 g/dL   HCT 63.9 (L) 60.9 - 47.9 %   MCV 92.3 80.0 - 100.0 fL   MCH 32.3 26.0 - 34.0 pg   MCHC 35.0 30.0 - 36.0 g/dL   RDW 83.0 (H) 88.4 - 84.4 %   Platelets 15 (LL) 150 - 400 K/uL   nRBC 4.4 (H) 0.0 - 0.2 %   Neutrophils Relative % 71 %   Neutro Abs 4.8 1.7 - 7.7 K/uL   Lymphocytes Relative 9 %   Lymphs Abs 0.6 (L) 0.7 - 4.0 K/uL   Monocytes Relative 11 %   Monocytes Absolute 0.8 0.1 - 1.0 K/uL   Eosinophils Relative 1 %   Eosinophils Absolute 0.1 0.0 - 0.5 K/uL   Basophils Relative 1 %   Basophils Absolute 0.0  0.0 - 0.1 K/uL   WBC Morphology See Note    RBC Morphology MORPHOLOGY UNREMARKABLE    Smear Review Normal platelet morphology    Immature Granulocytes 7 %   Abs Immature Granulocytes 0.43 (H) 0.00 - 0.07 K/uL   Reactive, Benign Lymphocytes PRESENT   Comprehensive metabolic panel     Status: Abnormal   Collection Time: 2024-01-19 10:15 AM  Result Value Ref Range   Sodium 119 (LL) 135 - 145 mmol/L   Potassium >7.5 (HH) 3.5 - 5.1 mmol/L   Chloride 84 (L) 98 - 111 mmol/L   CO2 16 (L) 22 - 32 mmol/L   Glucose, Bld 82 70 - 99 mg/dL   BUN 63 (H) 6 - 20 mg/dL   Creatinine, Ser 6.88 (H) 0.61 - 1.24 mg/dL   Calcium  8.8 (L) 8.9 - 10.3 mg/dL   Total Protein 5.8 (L) 6.5 - 8.1 g/dL   Albumin  3.2 (L) 3.5 - 5.0 g/dL   AST 5,446 (H) 15 - 41 U/L   ALT 1,429 (H) 0 - 44 U/L   Alkaline Phosphatase 619 (H) 38 - 126 U/L   Total Bilirubin 5.1 (H) 0.0 - 1.2 mg/dL   GFR, Estimated 24 (L) >60 mL/min   Anion gap 19 (H) 5 - 15  Ammonia     Status: Abnormal   Collection Time: 2024-01-19 10:15 AM  Result Value Ref Range   Ammonia 39 (H) 9 - 35 umol/L     I have reviewed pertinent nursing notes, vitals, labs, and images as necessary. I have ordered labwork to follow up on as indicated.  I have reviewed the last notes from staff over past 24 hours. I have discussed patient's care plan and test results with nursing staff, CM/SW, and other staff as appropriate.  Time spent: Greater than 50% of the 55 minute visit was spent in counseling/coordination of care for the patient as laid out in the A&P.   LOS: 3 days   Alm Apo, MD [facility] January 19, 2024, 1:07 PM

## 2024-01-09 NOTE — Progress Notes (Addendum)
 He is somewhat lethargic.  This may be from pain medications.  They may need to be adjusted a little bit.  He says he feels a little bit cold.  He is not eating much.  He has had no problems with fever.  He has had no problems with bleeding.  I am not sure how much she is going to the bathroom.  He has had a lower for a couple of days.  He has had no nausea or vomiting.  Again, he does seem to be a little lethargic.  He does answer some questions.  He says he is not having any pain in the left upper quadrant of his abdomen.  This is where he was having quite a bit of his pain.  We may want to go back the frequency of his Dilaudid .  Ultimately, he will clearly need Hospice.  I think that if things do not improve for him, he may need to go to a hospice facility.  His vital signs show temperature 97.5.  Pulse 99.  Blood pressure 98/78.  His lungs sound pretty clear bilaterally.  He has decent air movement bilaterally.  Cardiac exam regular rate and rhythm.  He has no murmurs.  Abdomen is slightly distended.  Bowel sounds are slightly decreased.  He has no tenderness to palpation in the left upper quadrant.  Extremities shows some muscle atrophy bilaterally.  Neurological exam does show the somewhat lethargic state.  For right now, I think this is a matter of try to get him on the right pain medication regimen.  Again I think this lethargy could be from pain medications.  It might be worthwhile getting some labs to make sure there is no hypercalcemia or symptoms that might be correctable.  Again, if his overall status does not improve, he definitely will need to go to a Hospice facility.  I do appreciate the great care that he is getting from everybody on 6 E.    Jeralyn Crease, MD  Colossians 1:11   ADDENDUM: I saw the lab work that we did this morning.  Clearly, he has shock liver.  I am not sure so why he would have this outside of this being consequence of his underlying malignancy.   He also has renal failure.  He has marked hyperkalemia.  He has marked thrombocytopenia.  I really believe this is indicative of his current status.  His ammonia level is on the higher side.  At this point, I think that we have to focus on his comfort.  I want to make sure that he has comfort, respect, and dignity.  I cannot imagine that he will survive through the day.  I really hate this.  He is so nice.  He has not had hold.  He obviously has had a very difficult malignancy.  I just want him to be comfortable.  We may need to think about a morphine  drip for him.  He may develop some agitation secondary to the elevated ammonia.  I know that everybody has worked really hard on him.  I do appreciate everybody's efforts.   Jeralyn Crease,  MD

## 2024-01-09 NOTE — Telephone Encounter (Addendum)
 Key: [id number] expired; renewed today  Medication Prior Authorization Status  Processed CoverMyMeds KEY: [id number] for oxyCODONE  HCl 10MG   Submitted & approved today  Per [employer] Electronic  PA Case ID: #: [id number]   Effective January 09, 2024 through 06/22/2024.

## 2024-01-09 NOTE — Plan of Care (Signed)
   Problem: Activity: Goal: Risk for activity intolerance will decrease Outcome: Progressing   Problem: Coping: Goal: Level of anxiety will decrease Outcome: Progressing   Problem: Elimination: Goal: Will not experience complications related to urinary retention Outcome: Progressing

## 2024-01-09 NOTE — Assessment & Plan Note (Signed)
-   Initial plan was discharging home with hospice to continue pain control and focus on comfort care approach -Acute worsening on 2024-01-03 with confusion, lethargy, tremors.  Labs show multi organ failure with shock liver, renal failure, hyperammonemia -Family updated along with rest of care team -Plan transitioning to inpatient anticipated death -Dilaudid  drip started; titrate for comfort

## 2024-01-09 NOTE — Telephone Encounter (Signed)
 Medication Prior Authorization Status  Processed CoverMyMeds KEY: [id number] tablets for OxyCONTIN  15MG  er tablets  Submitted Today  Per [employer] Electronic   PA Case ID: #: [id number] Rx #: [id number]   Effective Jan 06, 2024 through ?SABRA

## 2024-01-09 NOTE — Progress Notes (Signed)
 Daily Progress Note   Patient Name: Christopher Henson       Date: 01/20/2024 DOB: 21-Oct-1980  Age: 43 y.o. MRN#: [id number] Attending Physician: Patsy Lenis, MD Primary Care Physician: Bulah Lenis RAMAN, PA-C Admit Date: 12/19/2023 Length of Stay: 3 days  Reason for Consultation/Follow-up: Establishing goals of care and symptom management  Subjective:   CC:  Following up regarding complex medical decision making and symptom management.  Subjective:  Reviewed EMR including recent documentation from [facility] and also discussed with hospitalist. Appears sedated, resting in bed, with legs dangling off the side of the bed, no distress, attempts to open eyes and verbalizes some with slurring of words.  All questions answered at that time.  Noted palliative medicine team to continue to follow in patient's medical journey.  Discussed care with hospitalist, RN, TOC, and ACC liaison.  Coordinating to get patient home with [facility] hospice support. Objective:   Vital Signs:  BP 98/78 (BP Location: Left Arm)   Pulse 99   Temp (!) 97.5 F (36.4 C) (Oral)   Resp 14   Ht 6' (1.829 m)   Wt 74.1 kg   SpO2 96%   BMI 22.16 kg/m   Physical Exam: General: NAD, alert, cachectic, frail Cardiovascular: Tachycardia noted Respiratory: no increased work of breathing noted, not in respiratory distress Neuro: A&Ox4, following commands easily Psych: appears sedated, attempts to open eyes and follow commands, verbalizes some.   Assessment & Plan:   Assessment: Patient is a 43 y.o. 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 CTA showed progression of cancer. Palliative medicine consulted for symptom management and goals of care.    Recommendations/Plan: # Complex medical decision making/goals of care:  - Discussed care with patient as detailed above in HPI. Patient is not a candidate for further cancer directed therapies as per oncology.  Now focusing on getting patient home with hospice support.  Continuing symptom management while in the hospital.  [facility] and [facility] liaison coordinating discharge planning.  Would need same-day admission to hospice once discharged.  Palliative medicine team will continue to follow along to engage in conversations as able and appropriate.  -  Code Status: Limited: Do not attempt resuscitation (DNR) -DNR-LIMITED -Do Not Intubate/DNI   # Symptom management: Patient is receiving these palliative interventions for symptom management with an intent to improve quality of life.   - Pain/dyspnea, in setting of metastatic neuroendocrine cancer    OxyContin  15 mg every 12 hours scheduled    oral Dilaudid  to 4 mg tablet every 4 hours as needed   - Continue IV Dilaudid  to 1-2 every 4 hours as needed breakthrough after oral Dilaudid      gabapentin  to 200 mg every 8 hours during the day   - Continue Tylenol  650 3 times daily   - Continue oral dexamethasone  4 mg daily   - Anxiety, in setting of metastatic neuroendocrine cancer, improved   - Continue Valium  2 mg tab every 6 hours as needed   - Constipation, improved   - Continue senna 3 tabs twice daily   - Nausea/vomiting, improving   - Reviewed EKG on 12/19/2023 noting QTc 446   - Continue Reglan  5 mg 3 times daily daily before meals   - Receiving steroids as well  #  Psychosocial Support:  - Sister- Christopher Henson  # Discharge Planning: Home with Hospice  Discussed with: Hospitalist, RN, patient, [facility], ACC liaison  Thank you for allowing the palliative care team to participate in the care Christopher Henson. Mod MDM Lonia Serve MD Palliative Care Provider PMT # [phone]  If patient remains symptomatic despite maximum doses, please call PMT at  [phone] between 0700 and 1900. Outside of these hours, please call attending, as PMT does not have night coverage.

## 2024-01-09 NOTE — Discharge Summary (Signed)
 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  He has been treated with radiosurgery and failed 2 lines of chemoimmunotherapy per oncology.  He has had progression of disease despite treatments.  He continues to have uncontrolled cancer associated pain mostly involving his left chest wall area. Most recently he has also undergone hospitalization at [facility] on 12/10/2023 due to mandibular abscess requiring teeth extractions and washout of oral abscess.  He was discharged home on a course of Augmentin  and doxycycline  with outpatient follow-up with ID planned also. Other PMH includes HIV/AIDS, GERD.  He presented for this hospitalization with chest pain and shortness of breath.  He has also had poor appetite and intake recently.  Hospitalization directed for involving palliative care and pain control approach. He was started on pain control with palliative care assistance. Tentative plan was for discharging home with hospice however he had rapid decline while in the hospital and care was transitioned to comfort care approach inpatient with in-hospital death anticipated. Comfort was achieved and he continued to have ongoing rapid expected decline. He passed naturally on January 17, 2024.   Final Diagnoses:  Large cell neuroendocrine carcinoma Multi organ system failure  Active Hospital Problems   Diagnosis Date Noted   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    Priority: 1.   End of life care 2024/01/17    Priority: 1.   Cancer associated pain 12/20/2023    Priority: 2.   Goals of care, counseling/discussion 12/20/2023    Priority: 2.   HIV (human immunodeficiency virus infection) (HCC)  09/05/2018    Priority: 3.   Mandibular abscess 11/28/2019    Priority: 4.   Thrombocytopenia (HCC) 02/25/2019    Priority: 5.   DNR (do not resuscitate) 12/23/2023   Constipation 12/21/2023   Nausea 12/21/2023   AKI (acute kidney injury) (HCC) 12/21/2023   Metastatic lung carcinoma, unspecified laterality (HCC) 12/20/2023   Anxiety 12/20/2023   Counseling and coordination of care 12/20/2023   ACP (advance care planning) 12/20/2023   DNR (do not resuscitate) discussion 12/20/2023   High risk medication use 12/20/2023   Medication management 12/20/2023   Palliative care encounter 12/20/2023    Resolved Hospital Problems   Diagnosis Date Noted Date Resolved   AKI (acute kidney injury) (HCC) 01/12/24 12/20/2023    Priority: 3.    The results of significant diagnostics from this hospitalization (including imaging, microbiology, ancillary and laboratory) are listed below for reference.    Significant Diagnostic Studies: US  ASCITES (ABDOMEN LIMITED) Result Date: 12/22/2023 CLINICAL DATA:  [id number] Pain [id number] EXAM: LIMITED ABDOMEN ULTRASOUND FOR ASCITES TECHNIQUE: Limited ultrasound survey for ascites was performed in all four abdominal quadrants. COMPARISON:  CT abdomen pelvis 11/24/2023 FINDINGS: Small to moderate volume ascites noted within the left lower quadrant and pelvis. IMPRESSION: Small to moderate volume ascites noted within the left lower quadrant and pelvis. Electronically Signed   By: Morgane  Naveau M.D.   On: 12/22/2023 17:34   CT Angio Chest PE W/Cm &/Or Wo Cm Result Date: 2024-01-12 CLINICAL DATA:  Dyspnea, chronic, chest wall or pleura disease suspected. History of lung cancer. * Tracking Code: BO * EXAM: CT ANGIOGRAPHY CHEST WITH CONTRAST TECHNIQUE: Multidetector CT imaging of the chest was  performed using the standard protocol during bolus administration of intravenous contrast. Multiplanar CT image reconstructions and MIPs were obtained to evaluate the vascular  anatomy. RADIATION DOSE REDUCTION: This exam was performed according to the departmental dose-optimization program which includes automated exposure control, adjustment of the mA and/or kV according to patient size and/or use of iterative reconstruction technique. CONTRAST:  75mL OMNIPAQUE  IOHEXOL  350 MG/ML SOLN COMPARISON:  CT angiography chest from 12/10/2023 and 11/24/2023. FINDINGS: Cardiovascular: No evidence of embolism to the proximal subsegmental pulmonary artery level. Normal cardiac size. No pericardial effusion. No aortic aneurysm. There are coronary artery calcifications, in keeping with coronary artery disease. Mediastinum/Nodes: Visualized thyroid  gland appears grossly unremarkable. There are multiple mildly hyperattenuating masses abutting the mediastinal pleura with largest along the left upper anteromedial hemithorax measuring up to 3.9 x 5.1. These are highly concerning for pleural metastases. The esophagus is nondistended precluding optimal assessment. There are multiple enlarged mediastinal, bilateral hilar, left lower cervical and left axillary lymph nodes with largest in the left axilla measuring up to 2.5 x 3.9 cm, compatible with metastasis. When compared to the prior study, there is interval increase in the left axillary lymph node. Crohn's also appear slightly enlarged when compared to the prior exam, favoring worsening metastatic. No right axillary lymphadenopathy by size criteria. Lungs/Pleura: The central tracheo-bronchial tree is patent. Redemonstration of moderate irregular nodular pleural thickening especially along the left lower hemithorax, compatible pleural metastases. There also multiple solid noncalcified nodules mainly in the right lung upper lobe largest measuring up to 3 x 4 mm. The pre-existing nodules appears grossly unchanged however, there are several new 1-2 mm nodules (for example, series 12, images 30 and 31), compatible with worsening metastases. No pleural effusion,  lung mass or pneumothorax. There are multiple solid noncalcified opacities along the left major fissure, similar to prior study which may represent intra fissural lymph node versus pleural metastasis. No significant interval change. Upper Abdomen: There is pseudo cirrhotic appearance of the liver due to multiple underlying ill-defined hypoattenuating masses, compatible with metastasis. The masses are not well seen and therefore comparison with prior imaging is not performed. There also enlarged left retrocrural nodal mass (series 4, image 117), similar to the prior study. There is markedly enlarged left adrenal gland, compatible with metastasis. No significant interval change. There also multiple peripancreatic lymph nodes, metastatic as well. Remaining visualized upper abdominal viscera within normal limits. Musculoskeletal: A CT Port-a-Cath is seen in the right upper chest wall with the catheter terminating in the cavo-atrial junction region. Visualized soft tissues of the chest wall are otherwise grossly unremarkable. Redemonstration of sclerotic lesions in the sternum, compatible with metastases. No pathological fracture. There are mild multilevel degenerative changes in the visualized spine. Review of the MIP images confirms the above findings. IMPRESSION: 1. No embolism to the proximal subsegmental pulmonary artery level. 2. Redemonstration of extensive metastatic disease involving the pleura, mediastinal/hilar lymph nodes, left lower cervical and left axillary lymph nodes, liver, left adrenal gland, peripancreatic lymph nodes, left retrocrural lymph nodes and bones. There is interval increase in the size of left axillary lymph node as well as several new right lung nodules, compatible with worsening metastatic disease. 3. Multiple other nonacute observations, as described above. Electronically Signed   By: Ree Molt M.D.   On: 12/19/2023 10:26   DG Chest 2 View Result Date: 12/18/2023 CLINICAL DATA:   Chest pain and shortness of breath. EXAM: CHEST - 2 VIEW COMPARISON:  December 09, 2023 FINDINGS: There is stable right-sided  venous Port-A-Cath positioning. The heart size and mediastinal contours are within normal limits. Moderate severity left basilar atelectasis and/or infiltrate is seen. Left suprahilar scarring and/or atelectasis is also noted. Multifocal areas of pleural thickening are present within the left lung base. A small left pleural effusion is seen. No pneumothorax is identified. The visualized skeletal structures are unremarkable. IMPRESSION: 1. Moderate severity left basilar atelectasis and/or infiltrate with stable areas of left basilar pleural masses. 2. Left suprahilar scarring and/or atelectasis. 3. Small left pleural effusion. Electronically Signed   By: Suzen Dials M.D.   On: 12/18/2023 17:29   CT Angio Chest Pulmonary Embolism (PE) W or WO Contrast Result Date: 12/10/2023 CLINICAL DATA:  Severe chest pain. History of lung cancer. * Tracking Code: BO * EXAM: CT ANGIOGRAPHY CHEST WITH CONTRAST TECHNIQUE: Multidetector CT imaging of the chest was performed using the standard protocol during bolus administration of intravenous contrast. Multiplanar CT image reconstructions and MIPs were obtained to evaluate the vascular anatomy. RADIATION DOSE REDUCTION: This exam was performed according to the departmental dose-optimization program which includes automated exposure control, adjustment of the mA and/or kV according to patient size and/or use of iterative reconstruction technique. CONTRAST:  75mL OMNIPAQUE  IOHEXOL  350 MG/ML SOLN COMPARISON:  11/24/2023 FINDINGS: Cardiovascular: The heart size is normal. No substantial pericardial effusion. Trace pericardial effusion evident. Coronary artery calcification is evident. No thoracic aortic aneurysm. No substantial atherosclerotic calcification of the thoracic aorta. There is no filling defect within the opacified pulmonary arteries to suggest  the presence of an acute pulmonary embolus. Mediastinum/Nodes: Pre-vascular mass measured previously at 5.2 x 3.8 cm is 5.2 x 4.1 cm today on 58/5. Lesion posterior to the aortic arch measured previously at 1.8 x 1.4 is 1.9 x 1.9 cm today on 54/5. Subcarinal lymph node measured previously at 1.9 cm short axis is 2.4 cm short axis today on 89/5. Right hilar lymphadenopathy again noted. Similar left axillary lymphadenopathy given differential arm positioning. Loculated pleural fluid in the right hemithorax is similar. Lungs/Pleura: Similar left apical scarring. Collapse/consolidation in the left lung base is similar. Scattered tiny pulmonary nodules bilaterally. Nodules in the right apex appear progressive in the interval (see 6 mm right paraspinal nodule on 31/7. As above, there is loculated pleural fluid in the left hemithorax with nodular character in some regions suggesting underlying pleural nodularity. Upper Abdomen: Multiple liver metastases again noted. 3 cm lesion posterior left hepatic lobe on 129/5 was 2.4 cm previously (remeasured). 3.7 cm lesion lateral right hepatic dome was 2.2 cm previously remeasured. Large left adrenal metastasis evident. Upper abdominal lymphadenopathy is similar. Musculoskeletal: Sclerotic lesions are seen in the sternum and upper lumbar spine consistent with metastatic disease. Review of the MIP images confirms the above findings. IMPRESSION: 1. No CT evidence for acute pulmonary embolus. 2. Similar appearance of loculated pleural fluid in the left hemithorax with nodular character in some regions suggesting underlying pleural nodularity. 3. Mediastinal, left axillary, and upper abdominal lymphadenopathy. 4. Interval progression of hepatic metastatic disease. 5. Large left adrenal metastasis. 6. Similar appearance of sclerotic lesions in the sternum with sclerotic lesions in the upper lumbar spine consistent with metastatic disease but not included in the field of view for the  previous chest CT. 7. Scattered tiny pulmonary nodules bilaterally. Nodules in the right apex appear progressive in the interval. Electronically Signed   By: Camellia Candle M.D.   On: 12/10/2023 05:10   CT Maxillofacial W Contrast Result Date: 12/09/2023 CLINICAL DATA:  Initial evaluation for acute  left facial swelling. EXAM: CT MAXILLOFACIAL WITH CONTRAST TECHNIQUE: Multidetector CT imaging of the maxillofacial structures was performed with intravenous contrast. Multiplanar CT image reconstructions were also generated. RADIATION DOSE REDUCTION: This exam was performed according to the departmental dose-optimization program which includes automated exposure control, adjustment of the mA and/or kV according to patient size and/or use of iterative reconstruction technique. CONTRAST:  75mL OMNIPAQUE  IOHEXOL  350 MG/ML SOLN COMPARISON:  None Available. FINDINGS: Osseous: No acute osseous abnormality. No discrete or worrisome osseous lesions. Orbits: Globes orbital soft tissues within normal limits. Sinuses: Mild chronic mucoperiosteal thickening present about the ethmoidal air cells and maxillary sinuses. Paranasal sinuses are otherwise largely clear. Mastoid air cells and middle ear cavities are well pneumatized and free of fluid. Soft tissues: Soft tissue swelling with inflammatory stranding seen involving the soft tissues of the left lower face, concerning for acute infection/cellulitis. Poor dentition with innumerable dental caries, suggesting an odontogenic origin. Superimposed rim enhancing hypodense collection along the buccal aspect of the left mandibular body measures 3.6 x 1.8 x 3.6 cm, consistent with odontogenic abscess (series 3, image 22). No extension of infection into the deeper spaces of the neck at this time. Limited intracranial: No acute finding. IMPRESSION: 1. Soft tissue swelling with inflammatory stranding involving the soft tissues of the left lower face, concerning for acute  infection/cellulitis. Superimposed 3.6 x 1.8 x 3.6 cm odontogenic abscess along the buccal aspect of the left mandibular body as above. 2. Poor dentition with innumerable dental caries. Electronically Signed   By: Morene Hoard M.D.   On: 12/09/2023 21:51   DG Chest 2 View Result Date: 12/09/2023 CLINICAL DATA:  Chest pain EXAM: CHEST - 2 VIEW COMPARISON:  12/07/2023, CT 11/24/2023 FINDINGS: Multiple nodular pleural base masses and associated small pleural effusion are again seen within the left hemithorax with associated marked asymmetric left-sided volume loss. Since prior examination, the degree of volume loss has progressed. Right lung is clear. No pneumothorax. No pleural effusion on the right. Right internal jugular chest port tip seen at the superior cavoatrial junction. Cardiac size is at the upper limits of normal, stable. Pulmonary vascularity is normal. No acute bone abnormality. IMPRESSION: 1. Progressive left-sided volume loss. 2. 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 Electronically Signed   By: Dorethia Molt M.D.   On: 12/09/2023 19:41   DG Chest 2 View Result Date: 12/07/2023 CLINICAL DATA:  43 year old male with left side chest pain. Pleural effusion. Non-small cell lung cancer. EXAM: CHEST - 2 VIEW COMPARISON:  CTA chest 11/24/2023 and earlier. FINDINGS: PA and lateral views of the chest 0915 hours. Stable right chest power port. Stable abnormal mediastinal contour reflecting mediastinal lymphadenopathy, and superimposed abnormal pleural spaces and lung base on the left, not significantly changed. No pneumothorax. No pulmonary edema. No areas of worsening ventilation. Stable visualized osseous structures.  Negative visible bowel gas. IMPRESSION: Stable radiographic appearance of lung cancer since 11/24/2023. No new cardiopulmonary abnormality identified. Electronically Signed    By: VEAR Hurst M.D.   On: 12/07/2023 09:35    Microbiology: No results found for this or any previous visit (from the past 240 hours).   Labs: Basic Metabolic Panel: Recent Labs  Lab 12/20/23 0742 Jan 01, 2024 1015  NA 136 119*  K 4.7 >7.5*  CL 98 84*  CO2 18* 16*  GLUCOSE 96 82  BUN 24* 63*  CREATININE 1.09 3.11*  CALCIUM  9.2 8.8*   Liver  Function Tests: Recent Labs  Lab 12/19/23 0758 January 10, 2024 1015  AST 193* 4,553*  ALT 190* 1,429*  ALKPHOS 197* 619*  BILITOT 1.1 5.1*  PROT 7.0 5.8*  ALBUMIN  4.2 3.2*   No results for input(s): LIPASE, AMYLASE in the last 168 hours. Recent Labs  Lab 01/10/24 1015  AMMONIA 39*   CBC: Recent Labs  Lab 12/20/23 0742 2024-01-10 1015  WBC 9.1 6.6  NEUTROABS  --  4.8  HGB 15.3 12.6*  HCT 48.0 36.0*  MCV 101.5* 92.3  PLT 70* 15*   Cardiac Enzymes: No results for input(s): CKTOTAL, CKMB, CKMBINDEX, TROPONINI in the last 168 hours. D-Dimer No results for input(s): DDIMER in the last 72 hours. BNP: Invalid input(s): POCBNP CBG: Recent Labs  Lab 12/19/23 2055  GLUCAP 109*   Anemia work up No results for input(s): VITAMINB12, FOLATE, FERRITIN, TIBC, IRON, RETICCTPCT in the last 72 hours. Urinalysis    Component Value Date/Time   COLORURINE YELLOW 11/02/2023 1230   APPEARANCEUR CLEAR 11/02/2023 1230   LABSPEC 1.013 11/02/2023 1230   PHURINE 5.0 11/02/2023 1230   GLUCOSEU NEGATIVE 11/02/2023 1230   HGBUR NEGATIVE 11/02/2023 1230   BILIRUBINUR NEGATIVE 11/02/2023 1230   KETONESUR NEGATIVE 11/02/2023 1230   PROTEINUR NEGATIVE 11/02/2023 1230   NITRITE NEGATIVE 11/02/2023 1230   LEUKOCYTESUR NEGATIVE 11/02/2023 1230   Sepsis Labs Recent Labs  Lab 12/20/23 0742 2024/01/10 1015  WBC 9.1 6.6       SIGNED:  Alm Apo, MD  [facility] 12/25/2023, 4:04 PM

## 2024-01-09 DEATH — deceased

## 2024-01-11 ENCOUNTER — Other Ambulatory Visit

## 2024-01-11 ENCOUNTER — Ambulatory Visit

## 2024-01-11 ENCOUNTER — Inpatient Hospital Stay

## 2024-01-11 ENCOUNTER — Ambulatory Visit: Admitting: Hematology & Oncology

## 2024-02-14 ENCOUNTER — Ambulatory Visit: Admitting: Internal Medicine
